# Patient Record
Sex: Female | Born: 1950 | Race: White | Hispanic: Yes | Smoking: Never smoker
Health system: Southern US, Community
[De-identification: ages and names within clinical notes are randomized; demographics above are authoritative.]

## PROBLEM LIST (undated history)

## (undated) DIAGNOSIS — I10 Essential (primary) hypertension: Secondary | ICD-10-CM

## (undated) DIAGNOSIS — I998 Other disorder of circulatory system: Secondary | ICD-10-CM

## (undated) DIAGNOSIS — N186 End stage renal disease: Secondary | ICD-10-CM

## (undated) DIAGNOSIS — R0989 Other specified symptoms and signs involving the circulatory and respiratory systems: Secondary | ICD-10-CM

## (undated) DIAGNOSIS — R7881 Bacteremia: Secondary | ICD-10-CM

## (undated) DIAGNOSIS — I639 Cerebral infarction, unspecified: Secondary | ICD-10-CM

## (undated) DIAGNOSIS — E785 Hyperlipidemia, unspecified: Secondary | ICD-10-CM

## (undated) DIAGNOSIS — E119 Type 2 diabetes mellitus without complications: Secondary | ICD-10-CM

## (undated) DIAGNOSIS — R079 Chest pain, unspecified: Secondary | ICD-10-CM

## (undated) DIAGNOSIS — N10 Acute tubulo-interstitial nephritis: Secondary | ICD-10-CM

## (undated) DIAGNOSIS — I70229 Atherosclerosis of native arteries of extremities with rest pain, unspecified extremity: Secondary | ICD-10-CM

## (undated) DIAGNOSIS — I739 Peripheral vascular disease, unspecified: Secondary | ICD-10-CM

## (undated) HISTORY — DX: Bacteremia: R78.81

## (undated) HISTORY — PX: ABDOMINAL HYSTERECTOMY: SHX81

## (undated) HISTORY — DX: Other specified symptoms and signs involving the circulatory and respiratory systems: R09.89

## (undated) HISTORY — DX: Acute pyelonephritis: N10

## (undated) HISTORY — DX: Atherosclerosis of native arteries of extremities with rest pain, unspecified extremity: I70.229

## (undated) HISTORY — DX: Chest pain, unspecified: R07.9

## (undated) HISTORY — DX: Hyperlipidemia, unspecified: E78.5

## (undated) HISTORY — DX: Other disorder of circulatory system: I99.8

---

## 2015-01-10 ENCOUNTER — Inpatient Hospital Stay (HOSPITAL_COMMUNITY)
Admission: EM | Admit: 2015-01-10 | Discharge: 2015-01-14 | DRG: 871 | Disposition: A | Discharge status: Home / Self Care | Payer: Medicaid Other | Attending: Internal Medicine | Admitting: Internal Medicine

## 2015-01-10 ENCOUNTER — Encounter (HOSPITAL_COMMUNITY): Payer: Self-pay | Admitting: *Deleted

## 2015-01-10 ENCOUNTER — Emergency Department (HOSPITAL_COMMUNITY): Payer: Medicaid Other

## 2015-01-10 DIAGNOSIS — L089 Local infection of the skin and subcutaneous tissue, unspecified: Secondary | ICD-10-CM

## 2015-01-10 DIAGNOSIS — R739 Hyperglycemia, unspecified: Secondary | ICD-10-CM

## 2015-01-10 DIAGNOSIS — B9620 Unspecified Escherichia coli [E. coli] as the cause of diseases classified elsewhere: Secondary | ICD-10-CM | POA: Diagnosis present

## 2015-01-10 DIAGNOSIS — R7881 Bacteremia: Secondary | ICD-10-CM | POA: Diagnosis present

## 2015-01-10 DIAGNOSIS — I248 Other forms of acute ischemic heart disease: Secondary | ICD-10-CM | POA: Diagnosis present

## 2015-01-10 DIAGNOSIS — Z6822 Body mass index (BMI) 22.0-22.9, adult: Secondary | ICD-10-CM | POA: Diagnosis not present

## 2015-01-10 DIAGNOSIS — K5900 Constipation, unspecified: Secondary | ICD-10-CM

## 2015-01-10 DIAGNOSIS — E11621 Type 2 diabetes mellitus with foot ulcer: Secondary | ICD-10-CM | POA: Diagnosis present

## 2015-01-10 DIAGNOSIS — R7989 Other specified abnormal findings of blood chemistry: Secondary | ICD-10-CM | POA: Diagnosis present

## 2015-01-10 DIAGNOSIS — K5909 Other constipation: Secondary | ICD-10-CM

## 2015-01-10 DIAGNOSIS — A419 Sepsis, unspecified organism: Secondary | ICD-10-CM

## 2015-01-10 DIAGNOSIS — M25512 Pain in left shoulder: Secondary | ICD-10-CM | POA: Diagnosis present

## 2015-01-10 DIAGNOSIS — IMO0002 Reserved for concepts with insufficient information to code with codable children: Secondary | ICD-10-CM | POA: Diagnosis present

## 2015-01-10 DIAGNOSIS — Z90710 Acquired absence of both cervix and uterus: Secondary | ICD-10-CM | POA: Diagnosis not present

## 2015-01-10 DIAGNOSIS — N12 Tubulo-interstitial nephritis, not specified as acute or chronic: Secondary | ICD-10-CM | POA: Diagnosis present

## 2015-01-10 DIAGNOSIS — I10 Essential (primary) hypertension: Secondary | ICD-10-CM | POA: Diagnosis present

## 2015-01-10 DIAGNOSIS — A4151 Sepsis due to Escherichia coli [E. coli]: Principal | ICD-10-CM | POA: Diagnosis present

## 2015-01-10 DIAGNOSIS — E43 Unspecified severe protein-calorie malnutrition: Secondary | ICD-10-CM | POA: Insufficient documentation

## 2015-01-10 DIAGNOSIS — E1165 Type 2 diabetes mellitus with hyperglycemia: Secondary | ICD-10-CM | POA: Diagnosis present

## 2015-01-10 DIAGNOSIS — L97509 Non-pressure chronic ulcer of other part of unspecified foot with unspecified severity: Secondary | ICD-10-CM

## 2015-01-10 DIAGNOSIS — R6510 Systemic inflammatory response syndrome (SIRS) of non-infectious origin without acute organ dysfunction: Secondary | ICD-10-CM

## 2015-01-10 DIAGNOSIS — N10 Acute tubulo-interstitial nephritis: Secondary | ICD-10-CM

## 2015-01-10 DIAGNOSIS — L97529 Non-pressure chronic ulcer of other part of left foot with unspecified severity: Secondary | ICD-10-CM | POA: Diagnosis present

## 2015-01-10 DIAGNOSIS — R509 Fever, unspecified: Secondary | ICD-10-CM | POA: Diagnosis not present

## 2015-01-10 DIAGNOSIS — R1110 Vomiting, unspecified: Secondary | ICD-10-CM

## 2015-01-10 DIAGNOSIS — N390 Urinary tract infection, site not specified: Secondary | ICD-10-CM | POA: Insufficient documentation

## 2015-01-10 HISTORY — DX: Essential (primary) hypertension: I10

## 2015-01-10 LAB — URINE MICROSCOPIC-ADD ON

## 2015-01-10 LAB — CREATININE, SERUM
CREATININE: 1.09 mg/dL (ref 0.50–1.10)
GFR calc Af Amer: 62 mL/min — ABNORMAL LOW (ref >90)
GFR, EST NON AFRICAN AMERICAN: 54 mL/min — AB (ref >90)

## 2015-01-10 LAB — COMPREHENSIVE METABOLIC PANEL
ALBUMIN: 3.6 g/dL (ref 3.5–5.2)
ALT: 20 U/L (ref 0–35)
AST: 22 U/L (ref 0–37)
Alkaline Phosphatase: 98 U/L (ref 39–117)
Anion gap: 12 (ref 5–15)
BUN: 13 mg/dL (ref 6–23)
CALCIUM: 9 mg/dL (ref 8.4–10.5)
CO2: 19 mmol/L (ref 19–32)
Chloride: 101 mEq/L (ref 96–112)
Creatinine, Ser: 1.13 mg/dL — ABNORMAL HIGH (ref 0.50–1.10)
GFR calc Af Amer: 60 mL/min — ABNORMAL LOW (ref >90)
GFR calc non Af Amer: 51 mL/min — ABNORMAL LOW (ref >90)
Glucose, Bld: 440 mg/dL — ABNORMAL HIGH (ref 70–99)
POTASSIUM: 4.1 mmol/L (ref 3.5–5.1)
SODIUM: 132 mmol/L — AB (ref 135–145)
Total Bilirubin: 0.8 mg/dL (ref 0.3–1.2)
Total Protein: 7.6 g/dL (ref 6.0–8.3)

## 2015-01-10 LAB — CBC WITH DIFFERENTIAL/PLATELET
BASOS PCT: 0 % (ref 0–1)
Basophils Absolute: 0 10*3/uL (ref 0.0–0.1)
EOS ABS: 0 10*3/uL (ref 0.0–0.7)
EOS PCT: 0 % (ref 0–5)
HEMATOCRIT: 35.7 % — AB (ref 36.0–46.0)
Hemoglobin: 12.9 g/dL (ref 12.0–15.0)
Lymphocytes Relative: 5 % — ABNORMAL LOW (ref 12–46)
Lymphs Abs: 0.8 10*3/uL (ref 0.7–4.0)
MCH: 32 pg (ref 26.0–34.0)
MCHC: 36.1 g/dL — ABNORMAL HIGH (ref 30.0–36.0)
MCV: 88.6 fL (ref 78.0–100.0)
Monocytes Absolute: 1.1 10*3/uL — ABNORMAL HIGH (ref 0.1–1.0)
Monocytes Relative: 7 % (ref 3–12)
NEUTROS PCT: 88 % — AB (ref 43–77)
Neutro Abs: 14.4 10*3/uL — ABNORMAL HIGH (ref 1.7–7.7)
Platelets: 353 10*3/uL (ref 150–400)
RBC: 4.03 MIL/uL (ref 3.87–5.11)
RDW: 12.8 % (ref 11.5–15.5)
WBC: 16.4 10*3/uL — AB (ref 4.0–10.5)

## 2015-01-10 LAB — URINALYSIS, ROUTINE W REFLEX MICROSCOPIC
BILIRUBIN URINE: NEGATIVE
Glucose, UA: >1000 mg/dL — AB
Ketones, ur: 15 mg/dL — AB
Nitrite: NEGATIVE
PH: 5 (ref 5.0–8.0)
PROTEIN: 100 mg/dL — AB
Specific Gravity, Urine: 1.023 (ref 1.005–1.030)
Urobilinogen, UA: 0.2 mg/dL (ref 0.0–1.0)

## 2015-01-10 LAB — TROPONIN I: TROPONIN I: 0.06 ng/mL — AB (ref <0.031)

## 2015-01-10 LAB — GLUCOSE, CAPILLARY
Glucose-Capillary: 332 mg/dL — ABNORMAL HIGH (ref 70–99)
Glucose-Capillary: 159 mg/dL — ABNORMAL HIGH (ref 70–99)

## 2015-01-10 LAB — CBG MONITORING, ED
GLUCOSE-CAPILLARY: 372 mg/dL — AB (ref 70–99)
Glucose-Capillary: 389 mg/dL — ABNORMAL HIGH (ref 70–99)

## 2015-01-10 LAB — I-STAT CG4 LACTIC ACID, ED
LACTIC ACID, VENOUS: 2.22 mmol/L — AB (ref 0.5–2.2)
Lactic Acid, Venous: 1.9 mmol/L (ref 0.5–2.2)

## 2015-01-10 MED ORDER — ALUM & MAG HYDROXIDE-SIMETH 200-200-20 MG/5ML PO SUSP
30.0000 mL | Freq: Four times a day (QID) | ORAL | Status: DC | PRN
  Filled 2015-01-10: qty 30

## 2015-01-10 MED ORDER — ACETAMINOPHEN 650 MG RE SUPP: 650.0000 mg | Freq: Four times a day (QID) | RECTAL | Status: DC | PRN

## 2015-01-10 MED ORDER — PNEUMOCOCCAL VAC POLYVALENT 25 MCG/0.5ML IJ INJ
0.5000 mL | INJECTION | INTRAMUSCULAR | Status: AC
  Administered 2015-01-13: 0.5 mL via INTRAMUSCULAR
  Filled 2015-01-10: qty 0.5

## 2015-01-10 MED ORDER — INSULIN ASPART 100 UNIT/ML ~~LOC~~ SOLN: 0.0000 [IU] | Freq: Three times a day (TID) | SUBCUTANEOUS | Status: DC

## 2015-01-10 MED ORDER — CEFTRIAXONE SODIUM IN DEXTROSE 20 MG/ML IV SOLN
1.0000 g | INTRAVENOUS | Status: DC
  Administered 2015-01-11 – 2015-01-13 (×3): 1 g via INTRAVENOUS
  Filled 2015-01-10 (×3): qty 50

## 2015-01-10 MED ORDER — DM-GUAIFENESIN ER 30-600 MG PO TB12
1.0000 | ORAL_TABLET | Freq: Two times a day (BID) | ORAL | Status: DC
  Administered 2015-01-10 – 2015-01-14 (×8): 1 via ORAL
  Filled 2015-01-10 (×9): qty 1

## 2015-01-10 MED ORDER — MORPHINE SULFATE 2 MG/ML IJ SOLN: 2.0000 mg | INTRAMUSCULAR | Status: DC | PRN

## 2015-01-10 MED ORDER — INSULIN REGULAR BOLUS VIA INFUSION: 0.0000 [IU] | Freq: Three times a day (TID) | INTRAVENOUS | Status: DC

## 2015-01-10 MED ORDER — ONDANSETRON HCL 4 MG PO TABS: 4.0000 mg | ORAL_TABLET | Freq: Four times a day (QID) | ORAL | Status: DC | PRN

## 2015-01-10 MED ORDER — BISACODYL 10 MG RE SUPP
10.0000 mg | Freq: Once | RECTAL | Status: AC
  Administered 2015-01-10: 10 mg via RECTAL
  Filled 2015-01-10: qty 1

## 2015-01-10 MED ORDER — DEXTROSE 50 % IV SOLN: 25.0000 mL | INTRAVENOUS | Status: DC | PRN

## 2015-01-10 MED ORDER — DEXTROSE 5 % IV SOLN
1.0000 g | Freq: Once | INTRAVENOUS | Status: AC
  Administered 2015-01-10: 1 g via INTRAVENOUS
  Filled 2015-01-10: qty 10

## 2015-01-10 MED ORDER — METOPROLOL TARTRATE 100 MG PO TABS
100.0000 mg | ORAL_TABLET | Freq: Two times a day (BID) | ORAL | Status: DC
  Administered 2015-01-10 – 2015-01-14 (×8): 100 mg via ORAL
  Filled 2015-01-10 (×9): qty 1

## 2015-01-10 MED ORDER — INSULIN REGULAR HUMAN 100 UNIT/ML IJ SOLN: INTRAMUSCULAR | Status: DC

## 2015-01-10 MED ORDER — SODIUM CHLORIDE 0.9 % IV SOLN: INTRAVENOUS | Status: DC

## 2015-01-10 MED ORDER — ACETAMINOPHEN 325 MG PO TABS
650.0000 mg | ORAL_TABLET | Freq: Four times a day (QID) | ORAL | Status: DC | PRN
  Administered 2015-01-10 – 2015-01-12 (×3): 650 mg via ORAL
  Filled 2015-01-10 (×4): qty 2

## 2015-01-10 MED ORDER — CEFTRIAXONE SODIUM IN DEXTROSE 20 MG/ML IV SOLN: 1.0000 g | INTRAVENOUS | Status: DC

## 2015-01-10 MED ORDER — HYDRALAZINE HCL 20 MG/ML IJ SOLN: 10.0000 mg | Freq: Four times a day (QID) | INTRAMUSCULAR | Status: DC | PRN

## 2015-01-10 MED ORDER — ASPIRIN EC 81 MG PO TBEC
81.0000 mg | DELAYED_RELEASE_TABLET | Freq: Every day | ORAL | Status: DC
  Administered 2015-01-10 – 2015-01-14 (×5): 81 mg via ORAL
  Filled 2015-01-10 (×5): qty 1

## 2015-01-10 MED ORDER — SODIUM CHLORIDE 0.9 % IV BOLUS (SEPSIS)
1000.0000 mL | Freq: Once | INTRAVENOUS | Status: AC
Start: 2015-01-10 — End: 2015-01-10
  Administered 2015-01-10: 1000 mL via INTRAVENOUS

## 2015-01-10 MED ORDER — ACETAMINOPHEN 500 MG PO TABS
1000.0000 mg | ORAL_TABLET | Freq: Once | ORAL | Status: AC
  Administered 2015-01-10: 1000 mg via ORAL
  Filled 2015-01-10: qty 2

## 2015-01-10 MED ORDER — INSULIN ASPART 100 UNIT/ML ~~LOC~~ SOLN
0.0000 [IU] | SUBCUTANEOUS | Status: DC
  Administered 2015-01-10: 3 [IU] via SUBCUTANEOUS
  Administered 2015-01-10: 11 [IU] via SUBCUTANEOUS
  Administered 2015-01-11 (×3): 3 [IU] via SUBCUTANEOUS

## 2015-01-10 MED ORDER — SODIUM CHLORIDE 0.9 % IV SOLN
INTRAVENOUS | Status: AC
  Administered 2015-01-10: 1000 mL via INTRAVENOUS

## 2015-01-10 MED ORDER — INSULIN GLARGINE 100 UNIT/ML ~~LOC~~ SOLN
10.0000 [IU] | Freq: Every day | SUBCUTANEOUS | Status: DC
  Administered 2015-01-10 – 2015-01-12 (×3): 10 [IU] via SUBCUTANEOUS
  Filled 2015-01-10 (×4): qty 0.1

## 2015-01-10 MED ORDER — DEXTROSE-NACL 5-0.45 % IV SOLN: INTRAVENOUS | Status: DC

## 2015-01-10 MED ORDER — INSULIN ASPART 100 UNIT/ML ~~LOC~~ SOLN
3.0000 [IU] | Freq: Three times a day (TID) | SUBCUTANEOUS | Status: DC
  Administered 2015-01-11 – 2015-01-13 (×4): 3 [IU] via SUBCUTANEOUS

## 2015-01-10 MED ORDER — DOCUSATE SODIUM 100 MG PO CAPS
100.0000 mg | ORAL_CAPSULE | Freq: Two times a day (BID) | ORAL | Status: DC
  Administered 2015-01-10 – 2015-01-14 (×8): 100 mg via ORAL
  Filled 2015-01-10 (×9): qty 1

## 2015-01-10 MED ORDER — HEPARIN SODIUM (PORCINE) 5000 UNIT/ML IJ SOLN
5000.0000 [IU] | Freq: Three times a day (TID) | INTRAMUSCULAR | Status: DC
  Administered 2015-01-10 – 2015-01-14 (×11): 5000 [IU] via SUBCUTANEOUS
  Filled 2015-01-10 (×14): qty 1

## 2015-01-10 MED ORDER — ONDANSETRON HCL 4 MG/2ML IJ SOLN: 4.0000 mg | Freq: Four times a day (QID) | INTRAMUSCULAR | Status: DC | PRN

## 2015-01-10 MED ORDER — INSULIN ASPART 100 UNIT/ML ~~LOC~~ SOLN
0.0000 [IU] | SUBCUTANEOUS | Status: DC
  Administered 2015-01-10: 9 [IU] via SUBCUTANEOUS
  Filled 2015-01-10: qty 1

## 2015-01-10 MED ORDER — SODIUM CHLORIDE 0.9 % IJ SOLN
3.0000 mL | Freq: Two times a day (BID) | INTRAMUSCULAR | Status: DC
  Administered 2015-01-10 – 2015-01-13 (×6): 3 mL via INTRAVENOUS

## 2015-01-10 NOTE — H&P (Signed)
[facility] History and Physical                                                                                    Patient Demographics  Samantha [name], is a 64 y.o. female  MRN: [id number]   DOB - 12/10/1951  Admit Date - 01/10/2015  Outpatient Primary MD for the patient is CLOWARD,DAVIS L, MD   With History of -  Past Medical History  Diagnosis Date  . Hypertension   . Diabetes mellitus without complication       Past Surgical History  Procedure Laterality Date  . Abdominal hysterectomy      in for   Chief Complaint  Patient presents with  . Hypertension  . Headache  . Fatigue     HPI  Samantha [name]  is a 64 y.o. spanish-speaking female, with a past medical history of diabetes mellitus, urinary tract infection, and hypertension.  Her daughter is at bedside and ask as a Nurse, learning disability. Samantha [name] presents with 3-4 days of feeling poorly, subjective fevers, vomiting on January 13 and 14th, frequent urination with odor, blurry vision, polydipsia. She also mentions a slight sore throat, cough, and left ear pain.   I in the emergency room and she is found to have a fever of 102.4. Leukocytosis with a white count of 16.4, and tachycardia.  Her CBG is over 400.  Review of Systems    In addition to the HPI above,   + Head Ache, Blurry vision, Poly dipsia, Poly uria No problems swallowing food or Liquids, No Chest pain or Shortness of Breath, but she does complain of left sided shoulder pain. No Abdominal pain, + Bowel mvmts are constipated. No Blood in stool or Urine, No new skin rashes or bruises, + She complains of pain in her left foot, and a swollen 4th toe. No new weakness, tingling, numbness in any extremity, No recent weight gain or loss, No significant Mental Stressors.  A full 10 point Review of Systems was done, except as stated above, all other Review of Systems were negative.   Social History History  Substance Use Topics  . Smoking status:  Never Smoker   . Smokeless tobacco: Not on file  . Alcohol Use: No   She lives with her daughter who cares for her.  Her daughter describes the patient as sedentary.    Family History   Prior to Admission medications   Medication Sig Start Date End Date Taking? Authorizing Provider  glimepiride (AMARYL) 2 MG tablet Take 2 mg by mouth daily with breakfast.   Yes Historical Provider, MD  ibuprofen (ADVIL,MOTRIN) 200 MG tablet Take 400 mg by mouth every 6 (six) hours as needed (pain/fever).   Yes Historical Provider, MD  metFORMIN (GLUCOPHAGE) 500 MG tablet Take 500 mg by mouth 2 (two) times daily with a meal.   Yes Historical Provider, MD  metoprolol (LOPRESSOR) 100 MG tablet Take 100 mg by mouth 2 (two) times daily.   Yes Historical Provider, MD    No Known Allergies  Physical Exam  Vitals  Blood pressure 150/73, pulse 91, temperature 99.6 F (37.6 C), temperature source Oral, resp. rate 16, height   (1.6 m), weight 58.06 kg (128 lb), SpO2 99 %.   General:  Samantha [name], Hispanic female, lying in bed in NAD, Dtr at bedside.  Psych:  Normal affect and insight, Not Suicidal or Homicidal, Awake Alert, Oriented X 3.  Neuro:   No F.N deficits, ALL C.Nerves Intact, Strength 5/5 all 4 extremities, Sensation intact all 4 extremities.  ENT:  Ears and Eyes appear Normal, Conjunctivae clear, PERR. Moist Oral Mucosa.  Neck:  Supple Neck, No JVD, No cervical lymphadenopathy appreciated  Respiratory:  Symmetrical Chest wall movement, Good air movement bilaterally, CTAB.  Cardiac:  RRR, No Gallops, Rubs or Murmurs, No Parasternal Heave.  Abdomen:  Positive Bowel Sounds, Abdomen Soft, Mildly tender to palpation.  Skin:  No Cyanosis, Normal Skin Turgor, No Skin Rash or Bruise.  Dry ulceration on plantar surface of 4th left toe.  Extremities:  Good muscle tone,  joints appear normal , no effusions, Normal ROM.  4th left toe is swollen/ boggy looking.   Data Review  CBC  Recent  Labs Lab 01/10/15 1104  WBC 16.4*  HGB 12.9  HCT 35.7*  PLT 353  MCV 88.6  MCH 32.0  MCHC 36.1*  RDW 12.8  LYMPHSABS 0.8  MONOABS 1.1*  EOSABS 0.0  BASOSABS 0.0   ------------------------------------------------------------------------------------------------------------------  Chemistries   Recent Labs Lab 01/10/15 1104  NA 132*  K 4.1  CL 101  CO2 19  GLUCOSE 440*  BUN 13  CREATININE 1.13*  CALCIUM 9.0  AST 22  ALT 20  ALKPHOS 98  BILITOT 0.8    Urinalysis    Component Value Date/Time   COLORURINE YELLOW 01/10/2015 1315   APPEARANCEUR CLOUDY* 01/10/2015 1315   LABSPEC 1.023 01/10/2015 1315   PHURINE 5.0 01/10/2015 1315   GLUCOSEU >1000* 01/10/2015 1315   HGBUR MODERATE* 01/10/2015 1315   BILIRUBINUR NEGATIVE 01/10/2015 1315   KETONESUR 15* 01/10/2015 1315   PROTEINUR 100* 01/10/2015 1315   UROBILINOGEN 0.2 01/10/2015 1315   NITRITE NEGATIVE 01/10/2015 1315   LEUKOCYTESUR MODERATE* 01/10/2015 1315    ----------------------------------------------------------------------------------------------------------------  Imaging results:   Dg Chest Port 1 View  01/10/2015   CLINICAL DATA:  Fever.  Sepsis.  EXAM: PORTABLE CHEST - 1 VIEW  COMPARISON:  None.  FINDINGS: Heart size and pulmonary vascularity are normal. Lungs are clear. No osseous abnormality. The patient has taken a shallow inspiration.  IMPRESSION: Normal exam.   Electronically Signed   By: Geanie Cooley M.D.   On: 01/10/2015 11:17   Dg Foot Complete Left  01/10/2015   CLINICAL DATA:  Sepsis, fever, infected fourth toe  EXAM: LEFT FOOT - COMPLETE 3+ VIEW  COMPARISON:  None.  FINDINGS: Three views of the left foot submitted. No acute fracture or subluxation. Small plantar spur of calcaneus. Mild soft tissue swelling fourth toe. No definite bone destruction to suggest osteomyelitis.  IMPRESSION: No acute fracture or subluxation. Mild soft tissue swelling fourth toe. No definite bone destruction to  suggest osteomyelitis. Small plantar spur of calcaneus.   Electronically Signed   By: Natasha Mead M.D.   On: 01/10/2015 11:32    My personal review of EKG: pending.    Assessment & Plan  Principal Problem:   SIRS (systemic inflammatory response syndrome) Active Problems:   Pyelonephritis   Diabetic toe ulcer   Constipation   Uncontrolled diabetes mellitus   SIRS Likely due to UTI / Pyelonephritis.  Blood cultures and urine cultures are pending.  Pyelonephritis Patient with fever, vomiting, dysuria, fever.  Urine culture is pending.  Patient is started on Rocephin.  Uncontrolled Diabetes Mellitus with CBG 400+ and glucosuria Patient placed on glucostabilizer. She does not have DKA.  Checking Hgb A1C.  Suspect she will need insulin.  She has been compliant with amaryl and metformin.  Diabetic Coordinator has been consulted.  Constipation Patient reports no bowel movement x 5 days.  Will start colace and ducolax suppository.  URI Patient with cough, mild sore throat, left ear pain.  Treat supportively.  Left shoulder pain Non reproducible on palpation.  Will check Troponin x 1 and EKG.    DVT Prophylaxis Heparin  AM Labs Ordered, also please review Full Orders  Family Communication:   dtr at bedside.   Code Status: full  Likely DC to  home  Condition:  guarded  Time spent in minutes : 60    York, Marianne L PA-C on 01/10/2015 at 3:52 PM  Between 7am to 7pm - Pager - [phone]  After 7pm go to [email or web address] - password [email or web address]  And look for the night coverage person covering me after hours  [facility] Office  [phone]  Attending Patient was seen, examined,treatment plan was discussed with the  Advance Practice Provider.  I have directly reviewed the clinical findings, lab, imaging studies and management of this patient in detail. I have made the necessary changes to the above noted documentation, and agree with the documentation, as recorded by  the Advance Practice Provider.   64 year old Hispanic female with DM, HTN-admitted with Pyelonephritis and uncontrolled DM-symptomatic with polyuria/polydipsia. Agree with IV Rocephin and IV Glucose Stabilizer. Rest as above.  Windell NorfolkS Ghimire MD [facility].

## 2015-01-10 NOTE — Progress Notes (Signed)
Samantha CrandallMaria [name] [id number] Admitted to 5W20: 01/10/2015 5:23 PM Attending Provider: Maretta BeesShanker M Ghimire, MD    Samantha CrandallMaria [name] is a 64 y.o. female patient admitted from ED awake, alert  & orientated  X 3,  Full Code, VSS - Blood pressure 162/75, pulse 117, temperature 100.3 F (37.9 [name]), temperature source Oral, resp. rate 24, height 5\' 3"  (1.6 m), weight 58.06 kg (128 lb), SpO2 100 %., R/A, no [name]/o shortness of breath, no [name]/o chest pain, no distress noted. Tele # 19 placed and pt is currently running:sinus tachycardia.   IV site WDL:  forearm left, condition patent and no redness with a transparent dsg that's clean dry and intact.  Allergies:  No Known Allergies   Past Medical History  Diagnosis Date  . Hypertension   . Diabetes mellitus without complication     History:  obtained from pt. with daugter.  Pt orientation to unit, room and routine. Information packet given to patient/family and safety video watched.  Admission INP armband ID verified with patient/family, and in place. SR up x 2, fall risk assessment complete with Patient and family verbalizing understanding of risks associated with falls. Pt verbalizes an understanding of how to use the call bell and to call for help before getting out of bed.  Skin, clean-dry- intact without evidence of bruising, or skin tears.  DM foot ulcer to 4th toe on left foot.    Will cont to monitor and assist as needed.  Samantha [name], Samantha Stephens C, RN 01/10/2015 5:23 PM2

## 2015-01-10 NOTE — ED Notes (Signed)
Diabetic ulcer 4th toe left foot. Open to air. No drainage or pain.

## 2015-01-10 NOTE — ED Notes (Signed)
Daughter reports pt has had problems with constipation and reports pt's last bowel movement x 5 days ago.  Pt denies any abdominal pain or nausea at present.  Dr. Rubin PayorPickering aware.

## 2015-01-10 NOTE — ED Notes (Signed)
Patient with reported onset of not feeling well since yesterday.  She is complaining of headache and feeling tired.  She denies chest pain.  Denies dizziness.  Patient daughter is translating for patient per request.  Patient is febrile in triage.   Patient states she has been urinating more and has noticed a bad odor to her urine.  Patient has yellow coloring of skin noted as well.  Patient denies sob

## 2015-01-10 NOTE — ED Provider Notes (Signed)
CSN: [id number]     Arrival date & time 01/10/15  1012 History   First MD Initiated Contact with Patient 01/10/15 1050     Chief Complaint  Patient presents with  . Hypertension  . Headache  . Fatigue   Translated by family member  (Consider location/radiation/quality/duration/timing/severity/associated sxs/prior Treatment) Patient is a 64 y.o. female presenting with hypertension and headaches. The history is provided by the patient.  Hypertension Associated symptoms include headaches. Pertinent negatives include no chest pain, no abdominal pain and no shortness of breath.  Headache Associated symptoms: fatigue and fever   Associated symptoms: no abdominal pain, no back pain, no diarrhea, no pain, no nausea, no neck stiffness, no numbness and no vomiting    patient presents with fever and feeling weak. Has felt bad for last few days. Has possibly had some dysuria. No cough. No nausea vomiting. Feels fatigued. Headache. Blood pressure is also been elevated. States she has been out of her strips for her glucose monitor.  Past Medical History  Diagnosis Date  . Hypertension   . Diabetes mellitus without complication    Past Surgical History  Procedure Laterality Date  . Abdominal hysterectomy     No family history on file. History  Substance Use Topics  . Smoking status: Never Smoker   . Smokeless tobacco: Not on file  . Alcohol Use: No   OB History    No data available     Review of Systems  Constitutional: Positive for fever, appetite change and fatigue. Negative for activity change.  Eyes: Negative for pain.  Respiratory: Negative for chest tightness and shortness of breath.   Cardiovascular: Negative for chest pain and leg swelling.  Gastrointestinal: Negative for nausea, vomiting, abdominal pain and diarrhea.  Genitourinary: Positive for dysuria. Negative for flank pain.  Musculoskeletal: Negative for back pain and neck stiffness.  Skin: Negative for rash.   Neurological: Positive for headaches. Negative for weakness and numbness.  Psychiatric/Behavioral: Negative for behavioral problems.      Allergies  Review of patient's allergies indicates no known allergies.  Home Medications   Prior to Admission medications   Medication Sig Start Date End Date Taking? Authorizing Provider  glimepiride (AMARYL) 2 MG tablet Take 2 mg by mouth daily with breakfast.   Yes Historical Provider, MD  ibuprofen (ADVIL,MOTRIN) 200 MG tablet Take 400 mg by mouth every 6 (six) hours as needed (pain/fever).   Yes Historical Provider, MD  metFORMIN (GLUCOPHAGE) 500 MG tablet Take 500 mg by mouth 2 (two) times daily with a meal.   Yes Historical Provider, MD  metoprolol (LOPRESSOR) 100 MG tablet Take 100 mg by mouth 2 (two) times daily.   Yes Historical Provider, MD   BP 153/73 mmHg  Pulse 94  Temp(Src) 99.6 F (37.6 C) (Oral)  Resp 16  Ht  (1.6 m)  Wt 128 lb (58.06 kg)  BMI 22.68 kg/m2  SpO2 96% Physical Exam  Constitutional: She is oriented to person, place, and time. She appears well-developed and well-nourished.  HENT:  Head: Normocephalic and atraumatic.  Eyes: EOM are normal. Pupils are equal, round, and reactive to light.  Neck: Normal range of motion. Neck supple.  Cardiovascular: Regular rhythm and normal heart sounds.   No murmur heard. Tachycardia  Pulmonary/Chest: Effort normal and breath sounds normal. No respiratory distress. She has no wheezes. She has no rales.  Abdominal: Soft. Bowel sounds are normal. She exhibits no distension. There is no tenderness. There is no rebound and no  guarding.  Genitourinary:  No CVA tenderness.  Musculoskeletal: Normal range of motion.  Left fourth toe has an ulcer on the tip of it. No erythema or drainage.  Neurological: She is alert and oriented to person, place, and time. No cranial nerve deficit.  Skin: Skin is warm and dry.  Psychiatric: She has a normal mood and affect. Her speech is normal.   Nursing note and vitals reviewed.   ED Course  Procedures (including critical care time) Labs Review Labs Reviewed  CBC WITH DIFFERENTIAL - Abnormal; Notable for the following:    WBC 16.4 (*)    HCT 35.7 (*)    MCHC 36.1 (*)    Neutrophils Relative % 88 (*)    Neutro Abs 14.4 (*)    Lymphocytes Relative 5 (*)    Monocytes Absolute 1.1 (*)    All other components within normal limits  COMPREHENSIVE METABOLIC PANEL - Abnormal; Notable for the following:    Sodium 132 (*)    Glucose, Bld 440 (*)    Creatinine, Ser 1.13 (*)    GFR calc non Af Amer 51 (*)    GFR calc Af Amer 60 (*)    All other components within normal limits  URINALYSIS, ROUTINE W REFLEX MICROSCOPIC - Abnormal; Notable for the following:    APPearance CLOUDY (*)    Glucose, UA >1000 (*)    Hgb urine dipstick MODERATE (*)    Ketones, ur 15 (*)    Protein, ur 100 (*)    Leukocytes, UA MODERATE (*)    All other components within normal limits  URINE MICROSCOPIC-ADD ON - Abnormal; Notable for the following:    Squamous Epithelial / LPF MANY (*)    Bacteria, UA MANY (*)    All other components within normal limits  CBG MONITORING, ED - Abnormal; Notable for the following:    Glucose-Capillary 389 (*)    All other components within normal limits  I-STAT CG4 LACTIC ACID, ED - Abnormal; Notable for the following:    Lactic Acid, Venous 2.22 (*)    All other components within normal limits  CULTURE, BLOOD (ROUTINE X 2)  CULTURE, BLOOD (ROUTINE X 2)  URINE CULTURE  I-STAT CG4 LACTIC ACID, ED    Imaging Review Dg Chest Port 1 View  01/10/2015   CLINICAL DATA:  Fever.  Sepsis.  EXAM: PORTABLE CHEST - 1 VIEW  COMPARISON:  None.  FINDINGS: Heart size and pulmonary vascularity are normal. Lungs are clear. No osseous abnormality. The patient has taken a shallow inspiration.  IMPRESSION: Normal exam.   Electronically Signed   By: Geanie CooleyJim  Maxwell M.D.   On: 01/10/2015 11:17   Dg Foot Complete Left  01/10/2015   CLINICAL  DATA:  Sepsis, fever, infected fourth toe  EXAM: LEFT FOOT - COMPLETE 3+ VIEW  COMPARISON:  None.  FINDINGS: Three views of the left foot submitted. No acute fracture or subluxation. Small plantar spur of calcaneus. Mild soft tissue swelling fourth toe. No definite bone destruction to suggest osteomyelitis.  IMPRESSION: No acute fracture or subluxation. Mild soft tissue swelling fourth toe. No definite bone destruction to suggest osteomyelitis. Small plantar spur of calcaneus.   Electronically Signed   By: Natasha MeadLiviu  Pop M.D.   On: 01/10/2015 11:32     EKG Interpretation None      MDM   Final diagnoses:  Toe infection  Acute pyelonephritis  Sepsis, due to unspecified organism  Hyperglycemia    Patient with fever. Appears free from  UTI. Initially febrile and tachycardiac that is improved with IV fluids and Tylenol. IV antibiotics started. Toe does not appear to be severely infected this time. Will admit to internal medicine.    Juliet Rude. Rubin Payor, MD 01/10/15 1451

## 2015-01-10 NOTE — Progress Notes (Signed)
Report received from Serita KyleAshura, RN in ED.  Will await for pt. To arrive to 5W 20.  [place] Cellarelcine Zorion Nims, RN

## 2015-01-10 NOTE — Progress Notes (Signed)
Paged night coverage to inform of pt. CBG of 159 after second dose of insulin 11 units was given.  Also to inform of temp. of 102.5, tylenol 650 mg po given.  Will continue to monitor.

## 2015-01-10 NOTE — ED Notes (Signed)
Pt transported to and from bathroom to void with daughter's assistance.  Pt tolerated well.

## 2015-01-10 NOTE — Progress Notes (Signed)
Paged Algis DownsMarianne York, PAC to get clarification on admission orders, if we needed to start the gluco stabilizer.  Return call back form Clerance LavMarianne and new orders received and carried out.  Will continue to monitor.  [facility] Cellarelcine Jaeda Bruso, RN

## 2015-01-11 ENCOUNTER — Inpatient Hospital Stay (HOSPITAL_COMMUNITY): Payer: Medicaid Other

## 2015-01-11 DIAGNOSIS — E43 Unspecified severe protein-calorie malnutrition: Secondary | ICD-10-CM | POA: Insufficient documentation

## 2015-01-11 LAB — COMPREHENSIVE METABOLIC PANEL
ALT: 19 U/L (ref 0–35)
ANION GAP: 11 (ref 5–15)
AST: 30 U/L (ref 0–37)
Albumin: 3.3 g/dL — ABNORMAL LOW (ref 3.5–5.2)
Alkaline Phosphatase: 75 U/L (ref 39–117)
BUN: 12 mg/dL (ref 6–23)
CO2: 23 mmol/L (ref 19–32)
Calcium: 8.7 mg/dL (ref 8.4–10.5)
Chloride: 104 mEq/L (ref 96–112)
Creatinine, Ser: 0.97 mg/dL (ref 0.50–1.10)
GFR calc non Af Amer: 62 mL/min — ABNORMAL LOW (ref >90)
GFR, EST AFRICAN AMERICAN: 72 mL/min — AB (ref >90)
Glucose, Bld: 131 mg/dL — ABNORMAL HIGH (ref 70–99)
Potassium: 3.8 mmol/L (ref 3.5–5.1)
SODIUM: 138 mmol/L (ref 135–145)
Total Bilirubin: 0.4 mg/dL (ref 0.3–1.2)
Total Protein: 7.3 g/dL (ref 6.0–8.3)

## 2015-01-11 LAB — GLUCOSE, CAPILLARY
GLUCOSE-CAPILLARY: 197 mg/dL — AB (ref 70–99)
GLUCOSE-CAPILLARY: 157 mg/dL — AB (ref 70–99)
GLUCOSE-CAPILLARY: 151 mg/dL — AB (ref 70–99)
GLUCOSE-CAPILLARY: 136 mg/dL — AB (ref 70–99)
Glucose-Capillary: 117 mg/dL — ABNORMAL HIGH (ref 70–99)
Glucose-Capillary: 118 mg/dL — ABNORMAL HIGH (ref 70–99)
Glucose-Capillary: 90 mg/dL (ref 70–99)

## 2015-01-11 LAB — CBC
HCT: 35.4 % — ABNORMAL LOW (ref 36.0–46.0)
Hemoglobin: 12.4 g/dL (ref 12.0–15.0)
MCH: 31.4 pg (ref 26.0–34.0)
MCHC: 35 g/dL (ref 30.0–36.0)
MCV: 89.6 fL (ref 78.0–100.0)
Platelets: 340 10*3/uL (ref 150–400)
RBC: 3.95 MIL/uL (ref 3.87–5.11)
RDW: 13 % (ref 11.5–15.5)
WBC: 11.8 10*3/uL — ABNORMAL HIGH (ref 4.0–10.5)

## 2015-01-11 LAB — TROPONIN I
Troponin I: 0.05 ng/mL — ABNORMAL HIGH (ref <0.031)
Troponin I: 0.04 ng/mL — ABNORMAL HIGH (ref <0.031)
Troponin I: 0.04 ng/mL — ABNORMAL HIGH (ref <0.031)

## 2015-01-11 LAB — HEMOGLOBIN A1C
Hgb A1c MFr Bld: 10.1 % — ABNORMAL HIGH (ref <5.7)
MEAN PLASMA GLUCOSE: 243 mg/dL — AB (ref <117)

## 2015-01-11 MED ORDER — BOOST / RESOURCE BREEZE PO LIQD
1.0000 | Freq: Three times a day (TID) | ORAL | Status: DC
  Administered 2015-01-11 – 2015-01-12 (×2): 1 via ORAL

## 2015-01-11 MED ORDER — FLEET ENEMA 7-19 GM/118ML RE ENEM
1.0000 | ENEMA | RECTAL | Status: DC | PRN
  Filled 2015-01-11 (×2): qty 1

## 2015-01-11 MED ORDER — LIVING WELL WITH DIABETES BOOK - IN SPANISH
Freq: Once | Status: AC
  Administered 2015-01-11: 10:00:00
  Filled 2015-01-11: qty 1

## 2015-01-11 MED ORDER — INSULIN ASPART 100 UNIT/ML ~~LOC~~ SOLN
0.0000 [IU] | Freq: Three times a day (TID) | SUBCUTANEOUS | Status: DC
  Administered 2015-01-12: 2 [IU] via SUBCUTANEOUS
  Administered 2015-01-12: 11 [IU] via SUBCUTANEOUS
  Administered 2015-01-12: 3 [IU] via SUBCUTANEOUS
  Administered 2015-01-13 (×2): 5 [IU] via SUBCUTANEOUS
  Administered 2015-01-13: 8 [IU] via SUBCUTANEOUS

## 2015-01-11 MED ORDER — GLUCERNA SHAKE PO LIQD: 237.0000 mL | Freq: Two times a day (BID) | ORAL | Status: DC

## 2015-01-11 MED ORDER — INSULIN STARTER KIT- SYRINGES (SPANISH)
1.0000 | Freq: Once | Status: AC
  Administered 2015-01-11: 1
  Filled 2015-01-11: qty 1

## 2015-01-11 MED ORDER — INSULIN ASPART 100 UNIT/ML ~~LOC~~ SOLN
0.0000 [IU] | Freq: Every day | SUBCUTANEOUS | Status: DC
Start: 2015-01-11 — End: 2015-01-11

## 2015-01-11 NOTE — Evaluation (Signed)
Physical Therapy Evaluation Patient Details Name: Samantha [name] Hynson MRN: [id number] DOB: Nov 18, 1951 Today's Date: 01/11/2015   History of Present Illness  Samantha [name] Vullo  is a 64 y.o. spanish-speaking female, with a past medical history of diabetes mellitus, urinary tract infection, and hypertension.  Her daughter is at bedside and ask as a Nurse, learning disabilitytranslator. Mrs. Lysbeth PennerOrtega presents with 3-4 days of feeling poorly, subjective fevers, vomiting on January 13 and 14th, frequent urination with odor, blurry vision, polydipsia. She also mentions a slight sore throat, cough, and left ear pain.   I in the emergency room and she is found to have a fever of 102.4. Leukocytosis with a white count of 16.4, and tachycardia.  Her CBG is over 400.  Clinical Impression  Pt is feeling better, approaching baseline function.  Can be assisted as needed by her daughter as needed.  No further PT needs.  Will sign off.    Follow Up Recommendations No PT follow up    Equipment Recommendations  None recommended by PT    Recommendations for Other Services       Precautions / Restrictions Precautions Precautions: None      Mobility  Bed Mobility Overal bed mobility: Independent                Transfers Overall transfer level: Independent                  Ambulation/Gait Ambulation/Gait assistance: Independent   Assistive device: None Gait Pattern/deviations: WFL(Within Functional Limits)   Gait velocity interpretation: at or above normal speed for age/gender General Gait Details: Steady and fluid  Stairs            Wheelchair Mobility    Modified Rankin (Stroke Patients Only)       Balance Overall balance assessment: No apparent balance deficits (not formally assessed)                                           Pertinent Vitals/Pain Pain Assessment: No/denies pain    Home Living Family/patient expects to be discharged to:: Private residence Living  Arrangements: Children Available Help at Discharge: Family;Available 24 hours/day Type of Home: House Home Access: Level entry     Home Layout: Two level;Bed/bath upstairs        Prior Function Level of Independence: Independent               Hand Dominance        Extremity/Trunk Assessment               Lower Extremity Assessment: Overall WFL for tasks assessed      Cervical / Trunk Assessment: Normal  Communication   Communication: No difficulties  Cognition Arousal/Alertness: Awake/alert Behavior During Therapy: WFL for tasks assessed/performed Overall Cognitive Status: Within Functional Limits for tasks assessed                      General Comments      Exercises        Assessment/Plan    PT Assessment Patent does not need any further PT services  PT Diagnosis     PT Problem List    PT Treatment Interventions     PT Goals (Current goals can be found in the Care Plan section) Acute Rehab PT Goals PT Goal Formulation: All assessment and education complete, DC therapy  Frequency     Barriers to discharge        Co-evaluation               End of Session   Activity Tolerance: Patient tolerated treatment well Patient left: Other (comment) (left on toilet for pt's daughter to assist) Nurse Communication: Mobility status         Time: 4782-9562 PT Time Calculation (min) (ACUTE ONLY): 19 min   Charges:   PT Evaluation $Initial PT Evaluation Tier I: 1 Procedure PT Treatments $Gait Training: 8-22 mins   PT G Codes:        Isley Weisheit, Eliseo Gum 01/11/2015, 11:59 AM 01/11/2015  [place] Bing, PT [phone]  (pager)

## 2015-01-11 NOTE — Progress Notes (Signed)
Pt refused to self inject insulin. Stated she was shaky from being cold and did not want to do it right now. Told patient that she needs to be able to self inject insulin before being discharged home as she will be discharged with insulin for her diabetes. Pt stated she will do it later.

## 2015-01-11 NOTE — Consult Note (Signed)
WOC wound consult note Reason for Consult:  Left 4th toe Assessment:  Pt with palpable pulses, no significant edema.  Pt reports present for "about a month" via her daughter who is interpreter.   Non tender.  Wound type: neuropathic ulcer Measurement: 0.5cm x 0.5cm x 0 Wound bed: hyperkeratotic area, not open Drainage (amount, consistency, odor) no drainage Periwound: intact, some erythema noted of the affected toe, but minimal, no streaking  Dressing procedure/placement/frequency: Paint ulceration with betadine daily, allow to air dry.  No need for cover dressing.   Explained to daughter who discussed with patient to monitor the toe for any changes, drainage, new onset of pain, discoloration or erythema and to follow up with MD should any of these things occur.   Daughter verbalized understanding.  Discussed POC with patient and bedside nurse.  Re consult if needed, will not follow at this time. Thanks  Shankar Silber [employer] RN, CWOCN [phone])

## 2015-01-11 NOTE — Progress Notes (Addendum)
INITIAL NUTRITION ASSESSMENT  DOCUMENTATION CODES Per approved criteria  -Severe malnutrition in the context of chronic illness   Pt meets criteria for severe MALNUTRITION in the context of chronic illness as evidenced by moderate to severe fat and muscle depletion, <75% of estimated energy intake x 1 month.  INTERVENTION: -D/c Glucerna Shake po TID, each supplement provides 220 kcal and 10 grams of protein -Resource Breeze po TID, each supplement provides 250 kcal and 9 grams of protein  NUTRITION DIAGNOSIS: Inadequate oral intake related to decreased appetite as evidenced by diet hx.   Goal: Pt will meet >90% of estimated nutritional needs  Monitor:  PO/supplement intake, labs, weight changes, I/O's  Reason for Assessment: MST=2, Consult for diet education  64 y.o. female  Admitting Dx: SIRS (systemic inflammatory response syndrome)  ASSESSMENT: Pt admitted with SIRS. Hx obtained by pt daughter at bedside. She reports that pt has been in declining health since she moved from [place] to the [place] area approximately 2 months ago, where she is currently living with her daughter with her husband and children. Daughter reports that pt's health was poor in [place] as well. She confirms that pt has been losing weight for quite some time, but unable to quantify amount or time frame for weight loss. Daughter reports that pt's appetite is poor at baseline and often will only eat a few bites at meals. Pt ate very little breakfast this AM, per her daughter. She reports pt will occasionally have difficulty swallowing mushy foods, such as bread.  Pt reports that she feels like her clothes are baggier and that she can tell a significant difference in her legs, which have "been getting much smaller".  Pt daughter reports that pt has tried to drink Glucerna and Ensure, however, pt reports her "heart races" when she drinks them. She is agreeable to trying [employer]. Educated on importance of  good PO intake to promote healing.  Labs reviewed. Glucose: 131, CBGS: 90-197.   ADDENDUM (1518): Received consult for diet education, per family request. Visited pt and no family members currently available. Left AND Nutrition Care Manual's "Carbohydrate Counting For People With Diabetes" handout (Spanish version) and "My Plate handout" at bedside next to DM Coordinator's education materials. Will follow-up on 01/14/15 for reinforcement if pt is still in the hospital. Pt will be followed by the [facility].   Nutrition Focused Physical Exam:  Subcutaneous Fat:  Orbital Region: moderate depletion Upper Arm Region: moderate depletion Thoracic and Lumbar Region: WDL  Muscle:  Temple Region: moderate depletion Clavicle Bone Region: mild depletion Clavicle and Acromion Bone Region: mild depletion Scapular Bone Region: mild depletion Dorsal Hand: WDL Patellar Region: severe depletion Anterior Thigh Region: severe depletion Posterior Calf Region: severe depletion  Edema: none present   Height: Ht Readings from Last 1 Encounters:  01/10/15 '5\' 3"'  (1.6 m)    Weight: Wt Readings from Last 1 Encounters:  01/10/15 128 lb (58.06 kg)    Ideal Body Weight: 115#  % Ideal Body Weight: 111%  Wt Readings from Last 10 Encounters:  01/10/15 128 lb (58.06 kg)    Usual Body Weight: unknown  % Usual Body Weight: unknown  BMI:  Body mass index is 22.68 kg/(m^2). Normal weight range  Estimated Nutritional Needs: Kcal: 1700-1900 Protein: 75-85 grams Fluid: 1.7-1.9 L  Skin: DM ulcer left 4th toe  Diet Order: Diet Carb Modified  EDUCATION NEEDS: -Education needs addressed   Intake/Output Summary (Last 24 hours) at 01/11/15 1219 Last data filed  at 01/11/15 0015  Gross per 24 hour  Intake   1460 ml  Output    445 ml  Net   1015 ml    Last BM: 01/11/15  Labs:   Recent Labs Lab 01/10/15 1104 01/10/15 1911 01/11/15 0635  NA 132*  --  138  K 4.1  --  3.8  CL  101  --  104  CO2 19  --  23  BUN 13  --  12  CREATININE 1.13* 1.09 0.97  CALCIUM 9.0  --  8.7  GLUCOSE 440*  --  131*    CBG (last 3)   Recent Labs  01/11/15 0013 01/11/15 0414 01/11/15 0759  GLUCAP 90 197* 118*    Scheduled Meds: . aspirin EC  81 mg Oral Daily  . cefTRIAXone (ROCEPHIN)  IV  1 g Intravenous Q24H  . dextromethorphan-guaiFENesin  1 tablet Oral BID  . docusate sodium  100 mg Oral BID  . heparin  5,000 Units Subcutaneous 3 times per day  . insulin aspart  0-15 Units Subcutaneous 6 times per day  . insulin aspart  3 Units Subcutaneous TID WC  . insulin glargine  10 Units Subcutaneous QHS  . insulin starter kit- syringes  1 kit Other Once  . living well with diabetes book- in spanish   Does not apply Once  . metoprolol  100 mg Oral BID  . pneumococcal 23 valent vaccine  0.5 mL Intramuscular Tomorrow-1000  . sodium chloride  3 mL Intravenous Q12H    Continuous Infusions:   Past Medical History  Diagnosis Date  . Hypertension   . Diabetes mellitus without complication     Past Surgical History  Procedure Laterality Date  . Abdominal hysterectomy      Chanah Tidmore A. Jimmye Norman, RD, LDN, CDE Pager: [phone] After hours Pager: [phone]

## 2015-01-11 NOTE — Progress Notes (Signed)
Visited patient in room.  Patient sound asleep, daughter with her. Spoke with daughter. Had ordered Living Well with Diabetes in Spanish, ordered to watch DM videos in Spanish, and ordered insulin starter kit (syringes) in [place].  Staff RN to have patient give lunch injection of insulin. Will try to speak with patient and daughter after lunch. Will follow. Harvel Ricks RN BSN CDE

## 2015-01-11 NOTE — Progress Notes (Signed)
Worked with patient on drawing up practice dosage of insulin.  Was not very accurate in dosage. Another daughter was in the room. I had her try the dosage and she did very well with it.  Daughter states that her mother is never alone and they can help her with insulin. Spoke with Staff RN about having patient give herself the insulin at dinner time and to practice with her. Patient watching Spanish videos and received Spanish literature.  Will continue to follow while in hospital. Smith MinceKendra Anayah Arvanitis RN BSN CDE

## 2015-01-11 NOTE — Progress Notes (Signed)
PROGRESS NOTE  Samantha [name] WUJ:[id number] DOB: [birth year] DOA: 01/10/2015 PCP: Lavell Islam, MD  hx provided by daughter at bedside   Brief narrative 64 year old Hispanic female who moved from [place] 2 months back with history of hypertension, diabetes mellitus and history of UTI presented with urinary frequency with towel smell, polydipsia subjective fevers with vomiting and blurred vision. Patient was septic in the ED with fever of 102.4  Fahrenheit, white count of 16.4 K and tachycardic. Blood glucose was greater than 400. Admitted for  sepsis likely secondary to UTI with pyelonephritis.  Assessment/Plan: Sepsis secondary to UTI / pyelonephritis Febrile with  Temp of 102.9 F this am but feeling better overall. . Afebrile since. Blood cultures and urine cultures are pending. Will continue emperic antibiotic therapy. 40 care with IV fluids and Tylenol.   Uncontrolled Diabetes Mellitus with CBG 400+ and glucosuria Patient's CBG was quickly controlled with Lantus and NovoLog. Hgb A1C is 10.1. Suspect she will need insulin. Diabetic Coordinator consult pending. Have requested insulin teaching and diabetic videos via RN. Family requested a nutrition consult. The family speaks predominantly Spanish  Elevated troponin Peak of 0.06 and trended down 0.05>>0.04. No chest pain symptoms or EKG changes. Seems to be demand ischemia secondary to sepsis.  Constipation Patient reported no bowel movement 5 days on admission. She had a small bowel movement today. Will continue stool softeners.  URI Patient with cough, mild sore throat, left ear pain. Treat supportively.  Left shoulder pain with slight increase in troponin. Patient reports shoulder pain is resolved today. Troponins were minimally elevated. EKG shows normal sinus rhythm with no appearance of ST elevation or depression. Doubt ACS. Probably musculoskeletal. 2-D echo pending.  Diabetic ulcer of left fourth toe Appreciate  wound care consultation. Will follow recommendations     DVT Prophylaxis:  Heparin  Code Status: Full code Family Communication: Son, daughter, grandchildren at bedside Disposition Plan: To home when able.  PT recommends no follow-up. [facility] already scheduled thanks to case management.   Consultants: None Procedures: 2-D echo pending  Antibiotics: Anti-infectives    Start     Dose/Rate Route Frequency Ordered Stop   01/11/15 1000  cefTRIAXone (ROCEPHIN) 1 g in dextrose 5 % 50 mL IVPB - Premix     1 g100 mL/hr over 30 Minutes Intravenous Every 24 hours 01/10/15 1726     01/10/15 1730  cefTRIAXone (ROCEPHIN) 1 g in dextrose 5 % 50 mL IVPB - Premix  Status:  Discontinued     1 g100 mL/hr over 30 Minutes Intravenous Every 24 hours 01/10/15 1723 01/10/15 1726   01/10/15 1400  cefTRIAXone (ROCEPHIN) 1 g in dextrose 5 % 50 mL IVPB     1 g100 mL/hr over 30 Minutes Intravenous  Once 01/10/15 1351 01/10/15 1624     HPI/Subjective: Patient reports feeling much better. No dysuria. However she has no appetite.  Objective: Filed Vitals:   01/11/15 0801 01/11/15 1035 01/11/15 1101 01/11/15 1217  BP: 154/51 150/62  135/53  Pulse: 91 92  64  Temp: 102.9 F (39.4 C)  98.2 F (36.8 C) 97.9 F (36.6 C)  TempSrc: Oral  Oral   Resp: 18   18  Height:      Weight:      SpO2: 98%   98%    Intake/Output Summary (Last 24 hours) at 01/11/15 1434 Last data filed at 01/11/15 0015  Gross per 24 hour  Intake    460 ml  Output    445 ml  Net     15 ml   Filed Weights   01/10/15 1029  Weight: 58.06 kg (128 lb)    Exam: General: Well developed, well nourished, NAD, appears stated age . Speaks Spanish HEENT:  PERR, EOMI, Anicteic Sclera, MMM. No pharyngeal erythema or exudates  Neck: Supple, no JVD, no masses  Cardiovascular: RRR, S1 S2 auscultated, no rubs, murmurs or gallops.   Respiratory: Clear to auscultation bilaterally with equal chest rise    Abdomen: Soft, nontender, nondistended, + bowel sounds  Extremities: warm dry without cyanosis clubbing or edema.  Neuro: AAOx3, cranial nerves grossly intact. Strength 5/5 in upper and lower extremities  Skin: Without rashes exudates or nodules.  ancanthosis nigracans noted on top of back. Psych: Normal affect and demeanor with intact judgement and insight       Data Reviewed: Basic Metabolic Panel:  Recent Labs Lab 01/10/15 1104 01/10/15 1911 01/11/15 0635  NA 132*  --  138  K 4.1  --  3.8  CL 101  --  104  CO2 19  --  23  GLUCOSE 440*  --  131*  BUN 13  --  12  CREATININE 1.13* 1.09 0.97  CALCIUM 9.0  --  8.7   Liver Function Tests:  Recent Labs Lab 01/10/15 1104 01/11/15 0635  AST 22 30  ALT 20 19  ALKPHOS 98 75  BILITOT 0.8 0.4  PROT 7.6 7.3  ALBUMIN 3.6 3.3*   CBC:  Recent Labs Lab 01/10/15 1104 01/11/15 0635  WBC 16.4* 11.8*  NEUTROABS 14.4*  --   HGB 12.9 12.4  HCT 35.7* 35.4*  MCV 88.6 89.6  PLT 353 340   Cardiac Enzymes:  Recent Labs Lab 01/10/15 1911 01/11/15 0855 01/11/15 1314  TROPONINI 0.06* 0.05* 0.04*   CBG:  Recent Labs Lab 01/10/15 1927 01/11/15 0013 01/11/15 0414 01/11/15 0759 01/11/15 1212  GLUCAP 159* 90 197* 118* 157*    Recent Results (from the past 240 hour(s))  Blood Culture (routine x 2)     Status: None (Preliminary result)   Collection Time: 01/10/15 11:00 AM  Result Value Ref Range Status   Specimen Description BLOOD RIGHT ANTECUBITAL  Final   Special Requests BOTTLES DRAWN AEROBIC AND ANAEROBIC 10MLS  Final   Culture   Final           BLOOD CULTURE RECEIVED NO GROWTH TO DATE CULTURE WILL BE HELD FOR 5 DAYS BEFORE ISSUING A FINAL NEGATIVE REPORT Performed at [facility]    Report Status PENDING  Incomplete  Blood Culture (routine x 2)     Status: None (Preliminary result)   Collection Time: 01/10/15 11:15 AM  Result Value Ref Range Status   Specimen Description BLOOD ARM LEFT  Final    Special Requests BOTTLES DRAWN AEROBIC AND ANAEROBIC 2CC  Final   Culture   Final           BLOOD CULTURE RECEIVED NO GROWTH TO DATE CULTURE WILL BE HELD FOR 5 DAYS BEFORE ISSUING A FINAL NEGATIVE REPORT Performed at [facility]    Report Status PENDING  Incomplete     Studies: Dg Chest Port 1 View  01/10/2015   CLINICAL DATA:  Fever.  Sepsis.  EXAM: PORTABLE CHEST - 1 VIEW  COMPARISON:  None.  FINDINGS: Heart size and pulmonary vascularity are normal. Lungs are clear. No osseous abnormality. The patient has taken a shallow inspiration.  IMPRESSION: Normal exam.   Electronically  Signed   By: Geanie Cooley M.D.   On: 01/10/2015 11:17   Dg Abd Portable 1v  01/11/2015   CLINICAL DATA:  Constipation, vomiting.  EXAM: PORTABLE ABDOMEN - 1 VIEW  COMPARISON:  None.  FINDINGS: The bowel gas pattern is normal. No radio-opaque calculi or other significant radiographic abnormality are seen.  IMPRESSION: No evidence of bowel obstruction or ileus.   Electronically Signed   By: Roque Lias M.D.   On: 01/11/2015 08:15   Dg Foot Complete Left  01/10/2015   CLINICAL DATA:  Sepsis, fever, infected fourth toe  EXAM: LEFT FOOT - COMPLETE 3+ VIEW  COMPARISON:  None.  FINDINGS: Three views of the left foot submitted. No acute fracture or subluxation. Small plantar spur of calcaneus. Mild soft tissue swelling fourth toe. No definite bone destruction to suggest osteomyelitis.  IMPRESSION: No acute fracture or subluxation. Mild soft tissue swelling fourth toe. No definite bone destruction to suggest osteomyelitis. Small plantar spur of calcaneus.   Electronically Signed   By: Natasha Mead M.D.   On: 01/10/2015 11:32    Scheduled Meds: . aspirin EC  81 mg Oral Daily  . cefTRIAXone (ROCEPHIN)  IV  1 g Intravenous Q24H  . dextromethorphan-guaiFENesin  1 tablet Oral BID  . docusate sodium  100 mg Oral BID  . heparin  5,000 Units Subcutaneous 3 times per day  . insulin aspart  0-15 Units Subcutaneous 6 times per  day  . insulin aspart  3 Units Subcutaneous TID WC  . insulin glargine  10 Units Subcutaneous QHS  . metoprolol  100 mg Oral BID  . pneumococcal 23 valent vaccine  0.5 mL Intramuscular Tomorrow-1000  . sodium chloride  3 mL Intravenous Q12H   Continuous Infusions:   Principal Problem:   SIRS (systemic inflammatory response syndrome) Active Problems:   Pyelonephritis   Diabetic toe ulcer   Constipation   Uncontrolled diabetes mellitus    Conley Canal   [facility] Pager [phone]. If 7PM-7AM, please contact night-coverage at [email or web address], password [facility] 01/11/2015, 2:34 PM  LOS: 1 day

## 2015-01-12 DIAGNOSIS — R7881 Bacteremia: Secondary | ICD-10-CM

## 2015-01-12 DIAGNOSIS — I519 Heart disease, unspecified: Secondary | ICD-10-CM

## 2015-01-12 DIAGNOSIS — B9620 Unspecified Escherichia coli [E. coli] as the cause of diseases classified elsewhere: Secondary | ICD-10-CM

## 2015-01-12 HISTORY — DX: Bacteremia: R78.81

## 2015-01-12 HISTORY — DX: Unspecified Escherichia coli (E. coli) as the cause of diseases classified elsewhere: B96.20

## 2015-01-12 LAB — CBC
HCT: 31.4 % — ABNORMAL LOW (ref 36.0–46.0)
HEMOGLOBIN: 10.9 g/dL — AB (ref 12.0–15.0)
MCH: 30.5 pg (ref 26.0–34.0)
MCHC: 34.7 g/dL (ref 30.0–36.0)
MCV: 88 fL (ref 78.0–100.0)
PLATELETS: 307 10*3/uL (ref 150–400)
RBC: 3.57 MIL/uL — ABNORMAL LOW (ref 3.87–5.11)
RDW: 12.8 % (ref 11.5–15.5)
WBC: 5.9 10*3/uL (ref 4.0–10.5)

## 2015-01-12 LAB — GLUCOSE, CAPILLARY
GLUCOSE-CAPILLARY: 172 mg/dL — AB (ref 70–99)
GLUCOSE-CAPILLARY: 121 mg/dL — AB (ref 70–99)
GLUCOSE-CAPILLARY: 189 mg/dL — AB (ref 70–99)
Glucose-Capillary: 317 mg/dL — ABNORMAL HIGH (ref 70–99)

## 2015-01-12 LAB — URINE CULTURE: Colony Count: >=100000

## 2015-01-12 MED ORDER — GLUCERNA SHAKE PO LIQD
237.0000 mL | Freq: Three times a day (TID) | ORAL | Status: DC
  Administered 2015-01-12 – 2015-01-13 (×5): 237 mL via ORAL

## 2015-01-12 NOTE — Progress Notes (Signed)
PROGRESS NOTE  Samantha CrandallMaria [name] ZOX:[id number] DOB: December 08, 1951 DOA: 01/10/2015 PCP: Lavell IslamLOWARD,DAVIS L, MD  hx provided by daughter at bedside   Brief narrative 64 year old Hispanic female who moved from [place] 2 months back with history of hypertension, diabetes mellitus and history of UTI presented with urinary frequency with towel smell, polydipsia subjective fevers with vomiting and blurred vision. Patient was septic in the ED with fever of 102.4  Fahrenheit, white count of 16.4 K and tachycardic. Blood glucose was greater than 400.  Admitted for  sepsis likely secondary to UTI with pyelonephritis.  Assessment/Plan: Sepsis secondary to UTI / pyelonephritis Febrile at 100.9, but fever curve is trending down. Patient reports she feels better.  One of 2 blood cultures shows Escherichia coli. Sensitivities are pending. Urine culture shows Escherichia coli, sensitivities pending. Will continue Rocephin.  Uncontrolled Diabetes Mellitus with CBG 400+ and glucosuria Patient's CBG was quickly controlled with Lantus and NovoLog. Hgb A1C is 10.1. Suspect she will need insulin at discharge.The patient has received diabetic education and counsel from the diabetic coordinator and nursing staff.  Thus far she has refused to give herself insulin, however, she is surrounded by family 24 hours a day who  have offered to give her insulin to her.  She has a hospital follow-up appointment already [facility] on [facility].  Elevated troponin Peak of 0.06 and trended down 0.05>>0.04. No chest pain symptoms or EKG changes. Seems to be demand ischemia secondary to sepsis. 2-D echo shows grade 1 diastolic dysfunction with preserved left ventricular ejection fraction. No mention of vegetations.  Constipation Patient reported no bowel movement 5 days on admission. Resolved with stool softeners.  URI Patient with cough, mild sore throat, left ear pain. Treat  supportively.  Diabetic ulcer of left fourth toe Appreciate wound care consultation. Will follow recommendations   DVT Prophylaxis:  Heparin  Code Status: Full code Family Communication: Daughter-in-law bedside. She translated for her mother.  Disposition Plan: To home when able.  PT recommends no follow-up. [facility] is already scheduled thanks to case management.   Consultants: None Procedures: 2-D echo   Antibiotics: Anti-infectives    Start     Dose/Rate Route Frequency Ordered Stop   01/11/15 1000  cefTRIAXone (ROCEPHIN) 1 g in dextrose 5 % 50 mL IVPB - Premix     1 g100 mL/hr over 30 Minutes Intravenous Every 24 hours 01/10/15 1726     01/10/15 1730  cefTRIAXone (ROCEPHIN) 1 g in dextrose 5 % 50 mL IVPB - Premix  Status:  Discontinued     1 g100 mL/hr over 30 Minutes Intravenous Every 24 hours 01/10/15 1723 01/10/15 1726   01/10/15 1400  cefTRIAXone (ROCEPHIN) 1 g in dextrose 5 % 50 mL IVPB     1 g100 mL/hr over 30 Minutes Intravenous  Once 01/10/15 1351 01/10/15 1624     HPI/Subjective: Patient reports feeling much better. She has been ambulating about the room  Objective: Filed Vitals:   01/11/15 1724 01/11/15 2132 01/12/15 0540 01/12/15 1440  BP: 179/63 153/70 147/56 161/65  Pulse: 67 72 66 65  Temp: 100.5 F (38.1 C) 100 F (37.8 C) 99.6 F (37.6 C) 98.6 F (37 C)  TempSrc: Oral   Oral  Resp: 18 18 18 18   Height:      Weight:      SpO2: 97% 99% 97% 100%   No intake or output data in the 24 hours ending  01/12/15 1848 Filed Weights   01/10/15 1029  Weight: 58.06 kg (128 lb)    Exam: General: Well developed, well nourished, NAD, appears stated age . daughter at bedside  HEENT:  PERR, EOMI, Anicteic Sclera, MMM. No pharyngeal erythema or exudates  Neck: Supple, no JVD, no masses  Cardiovascular: RRR, S1 S2 auscultated, no rubs, murmurs or gallops.   Respiratory: Clear to auscultation bilaterally with equal chest rise    Abdomen: Soft, nontender, nondistended, + bowel sounds  Extremities: warm dry without cyanosis clubbing or edema.  Skin: Without rashes exudates or nodules.  ancanthosis nigracans noted on top of back.    Data Reviewed: Basic Metabolic Panel:  Recent Labs Lab 01/10/15 1104 01/10/15 1911 01/11/15 0635  NA 132*  --  138  K 4.1  --  3.8  CL 101  --  104  CO2 19  --  23  GLUCOSE 440*  --  131*  BUN 13  --  12  CREATININE 1.13* 1.09 0.97  CALCIUM 9.0  --  8.7   Liver Function Tests:  Recent Labs Lab 01/10/15 1104 01/11/15 0635  AST 22 30  ALT 20 19  ALKPHOS 98 75  BILITOT 0.8 0.4  PROT 7.6 7.3  ALBUMIN 3.6 3.3*   CBC:  Recent Labs Lab 01/10/15 1104 01/11/15 0635 01/12/15 0900  WBC 16.4* 11.8* 5.9  NEUTROABS 14.4*  --   --   HGB 12.9 12.4 10.9*  HCT 35.7* 35.4* 31.4*  MCV 88.6 89.6 88.0  PLT 353 340 307   Cardiac Enzymes:  Recent Labs Lab 01/10/15 1911 01/11/15 0855 01/11/15 1314 01/11/15 2031  TROPONINI 0.06* 0.05* 0.04* 0.04*   CBG:  Recent Labs Lab 01/11/15 2011 01/11/15 2145 01/12/15 0821 01/12/15 1205 01/12/15 1713  GLUCAP 136* 117* 172* 317* 121*    Recent Results (from the past 240 hour(s))  Blood Culture (routine x 2)     Status: None (Preliminary result)   Collection Time: 01/10/15 11:00 AM  Result Value Ref Range Status   Specimen Description BLOOD RIGHT ANTECUBITAL  Final   Special Requests BOTTLES DRAWN AEROBIC AND ANAEROBIC  Final   Culture   Final           BLOOD CULTURE RECEIVED NO GROWTH TO DATE CULTURE WILL BE HELD FOR 5 DAYS BEFORE ISSUING A FINAL NEGATIVE REPORT Performed at [facility]    Report Status PENDING  Incomplete  Blood Culture (routine x 2)     Status: None (Preliminary result)   Collection Time: 01/10/15 11:15 AM  Result Value Ref Range Status   Specimen Description BLOOD ARM LEFT  Final   Special Requests BOTTLES DRAWN AEROBIC AND ANAEROBIC 2CC  Final   Culture   Final    ESCHERICHIA  COLI Note: CRITICAL RESULT CALLED TO, READ BACK BY AND VERIFIED WITH: THELMA J RN  VINCJ Performed at [facility]    Report Status PENDING  Incomplete  Urine culture     Status: None   Collection Time: 01/10/15  1:15 PM  Result Value Ref Range Status   Specimen Description URINE, CLEAN CATCH  Final   Special Requests NONE  Final   Colony Count   Final    >=100,000 COLONIES/ML Performed at [facility]    Culture   Final    ESCHERICHIA COLI Performed at [facility]    Report Status 01/12/2015 FINAL  Final   Organism ID, Bacteria ESCHERICHIA COLI  Final  Susceptibility   Escherichia coli - MIC*    AMPICILLIN >=32 RESISTANT Resistant     CEFAZOLIN <=4 SENSITIVE Sensitive     CEFTRIAXONE <=1 SENSITIVE Sensitive     CIPROFLOXACIN <=0.25 SENSITIVE Sensitive     GENTAMICIN >=16 RESISTANT Resistant     LEVOFLOXACIN 0.5 SENSITIVE Sensitive     NITROFURANTOIN <=16 SENSITIVE Sensitive     TOBRAMYCIN 4 SENSITIVE Sensitive     TRIMETH/SULFA >=320 RESISTANT Resistant     PIP/TAZO <=4 SENSITIVE Sensitive     * ESCHERICHIA COLI     Studies: Dg Abd Portable 1v  01/11/2015   CLINICAL DATA:  Constipation, vomiting.  EXAM: PORTABLE ABDOMEN - 1 VIEW  COMPARISON:  None.  FINDINGS: The bowel gas pattern is normal. No radio-opaque calculi or other significant radiographic abnormality are seen.  IMPRESSION: No evidence of bowel obstruction or ileus.   Electronically Signed   By: Roque Lias M.D.   On: 01/11/2015 08:15    Scheduled Meds: . aspirin EC  81 mg Oral Daily  . cefTRIAXone (ROCEPHIN)  IV  1 g Intravenous Q24H  . dextromethorphan-guaiFENesin  1 tablet Oral BID  . docusate sodium  100 mg Oral BID  . feeding supplement (GLUCERNA SHAKE)  237 mL Oral TID BM  . heparin  5,000 Units Subcutaneous 3 times per day  . insulin aspart  0-15 Units Subcutaneous TID WC  . insulin aspart  3 Units Subcutaneous TID WC  . insulin glargine  10 Units Subcutaneous QHS    . metoprolol  100 mg Oral BID  . pneumococcal 23 valent vaccine  0.5 mL Intramuscular Tomorrow-1000  . sodium chloride  3 mL Intravenous Q12H   Continuous Infusions:   Principal Problem:   SIRS (systemic inflammatory response syndrome) Active Problems:   Bacteremia, escherichia coli   Pyelonephritis   Diabetic toe ulcer   Constipation   Uncontrolled diabetes mellitus   Protein-calorie malnutrition, severe    Bernadene, [place]   [facility] Pager [phone]. If 7PM-7AM, please contact night-coverage at [email or web address], password [facility] 01/12/2015, 6:48 PM  LOS: 2 days

## 2015-01-12 NOTE — Progress Notes (Signed)
Nutrition Brief Note: Spoke with pt's RN who reports that Resource Breeze elevated blood sugar, requests change to Whole Foodslucerna Shake, will order.  Unit RD to follow up. Kendell BaneHeather Klara Stjames RD, LDN, CNSC [phone] Pager [phone] After Hours Pager

## 2015-01-12 NOTE — Progress Notes (Signed)
  Echocardiogram 2D Echocardiogram has been performed.  Samantha [name], Samantha [name] 01/12/2015, 11:57 AM

## 2015-01-12 NOTE — Progress Notes (Signed)
Pt refused to self inject insulin. Stated her hands were shaky and did not want to do it now. RN encouraged pt to attempt injection. Pt continued to refuse.

## 2015-01-13 DIAGNOSIS — I10 Essential (primary) hypertension: Secondary | ICD-10-CM | POA: Diagnosis not present

## 2015-01-13 LAB — IRON AND TIBC
Iron: 41 ug/dL — ABNORMAL LOW (ref 42–145)
Saturation Ratios: 17 % — ABNORMAL LOW (ref 20–55)
TIBC: 237 ug/dL — ABNORMAL LOW (ref 250–470)
UIBC: 196 ug/dL (ref 125–400)

## 2015-01-13 LAB — GLUCOSE, CAPILLARY
GLUCOSE-CAPILLARY: 180 mg/dL — AB (ref 70–99)
GLUCOSE-CAPILLARY: 246 mg/dL — AB (ref 70–99)
GLUCOSE-CAPILLARY: 295 mg/dL — AB (ref 70–99)
GLUCOSE-CAPILLARY: 254 mg/dL — AB (ref 70–99)
Glucose-Capillary: 210 mg/dL — ABNORMAL HIGH (ref 70–99)

## 2015-01-13 MED ORDER — LISINOPRIL 20 MG PO TABS
20.0000 mg | ORAL_TABLET | Freq: Every day | ORAL | Status: DC
  Administered 2015-01-14: 20 mg via ORAL
  Filled 2015-01-13: qty 1

## 2015-01-13 MED ORDER — MAGNESIUM HYDROXIDE 400 MG/5ML PO SUSP
30.0000 mL | Freq: Once | ORAL | Status: AC
  Administered 2015-01-13: 30 mL via ORAL
  Filled 2015-01-13: qty 30

## 2015-01-13 MED ORDER — HYDRALAZINE HCL 25 MG PO TABS
25.0000 mg | ORAL_TABLET | Freq: Once | ORAL | Status: AC
  Administered 2015-01-13: 25 mg via ORAL
  Filled 2015-01-13: qty 1

## 2015-01-13 MED ORDER — CIPROFLOXACIN HCL 500 MG PO TABS
500.0000 mg | ORAL_TABLET | Freq: Two times a day (BID) | ORAL | Status: DC
  Administered 2015-01-13 – 2015-01-14 (×3): 500 mg via ORAL
  Filled 2015-01-13 (×5): qty 1

## 2015-01-13 MED ORDER — INSULIN GLARGINE 100 UNIT/ML ~~LOC~~ SOLN
14.0000 [IU] | Freq: Every day | SUBCUTANEOUS | Status: DC
  Filled 2015-01-13: qty 0.14

## 2015-01-13 MED ORDER — INSULIN GLARGINE 100 UNIT/ML ~~LOC~~ SOLN
18.0000 [IU] | Freq: Every day | SUBCUTANEOUS | Status: DC
  Administered 2015-01-13: 18 [IU] via SUBCUTANEOUS
  Filled 2015-01-13: qty 0.18

## 2015-01-13 MED ORDER — INSULIN ASPART 100 UNIT/ML ~~LOC~~ SOLN: 5.0000 [IU] | Freq: Three times a day (TID) | SUBCUTANEOUS | Status: DC

## 2015-01-13 MED ORDER — LISINOPRIL 5 MG PO TABS
5.0000 mg | ORAL_TABLET | Freq: Every day | ORAL | Status: DC
  Administered 2015-01-13: 5 mg via ORAL
  Filled 2015-01-13: qty 1

## 2015-01-13 NOTE — Progress Notes (Signed)
BP 171/64 and Temperature 100.4. Within parameters set for PRN medication administration. Will continue to monitor.

## 2015-01-13 NOTE — Progress Notes (Signed)
PROGRESS NOT  Samantha [name] ZOX:[id number] DOB: 12/24/1951 DOA: 01/10/2015 PCP: Lavell Islam, MD  hx provided by daughter at bedside   Brief narrative 64 year old Hispanic female who moved from [place] 2 months back with history of hypertension, diabetes mellitus and history of UTI presented with urinary frequency with towel smell, polydipsia subjective fevers with vomiting and blurred vision. Patient was septic in the ED with fever of 102.4  Fahrenheit, white count of 16.4 K and tachycardic. Blood glucose was greater than 400.  Admitted for  sepsis likely secondary to UTI with pyelonephritis.  Assessment/Plan: Sepsis secondary to UTI / pyelonephritis Still with fever at 100.4 this am.  One of 2 blood cultures shows Escherichia coli. Urine culture shows  Escherichia coli, sensitive to Rocephin & Cipro.  Patient has received 4 doses of IV Rocephin.  Will place on oral Cipro today as she has lost her IV access.  Uncontrolled Diabetes Mellitus with CBG 400+ and glucosuria Patient's CBG was quickly controlled with Lantus and NovoLog. Hgb A1C is 10.1. Suspect she will need insulin at discharge.The patient has received diabetic education and counsel from the diabetic coordinator and nursing staff.  Thus far she has had difficulty giving herself insulin, however, she is surrounded by family 24 hours a day who have offered to give her insulin to her.   1/17 cbgs rising. Will titrate Lantus dose up to 14 units.   She has a hospital follow-up appointment already scheduled [facility] on January [facility].  Elevated troponin Peak of 0.06 and trended down 0.05>>0.04. No chest pain symptoms or EKG changes. Seems to be demand ischemia secondary to sepsis. 2-D echo shows grade 1 diastolic dysfunction with preserved left ventricular ejection fraction. No mention of vegetations.  Constipation Patient reported no bowel movement 5 days on admission. On stool softeners.   Will give 1 dose of MOM.  URI Patient with cough, mild sore throat, left ear pain. Treat supportively.  Diabetic ulcer of left fourth toe Appreciate wound care consultation. Will follow recommendations   DVT Prophylaxis:  Heparin  Code Status: Full code Family Communication: Daughter-in-law bedside. She translated for her mother.  Disposition Plan: To home when able.  PT recommends no follow-up. [facility]-up [facility] is already scheduled thanks to case management.   Consultants: None Procedures: 2-D echo   Antibiotics: Anti-infectives    Start     Dose/Rate Route Frequency Ordered Stop   01/13/15 1030  ciprofloxacin (CIPRO) tablet 500 mg     500 mg Oral 2 times daily 01/13/15 1022     01/11/15 1000  cefTRIAXone (ROCEPHIN) 1 g in dextrose 5 % 50 mL IVPB - Premix  Status:  Discontinued     1 g100 mL/hr over 30 Minutes Intravenous Every 24 hours 01/10/15 1726 01/13/15 1022   01/10/15 1730  cefTRIAXone (ROCEPHIN) 1 g in dextrose 5 % 50 mL IVPB - Premix  Status:  Discontinued     1 g100 mL/hr over 30 Minutes Intravenous Every 24 hours 01/10/15 1723 01/10/15 1726   01/10/15 1400  cefTRIAXone (ROCEPHIN) 1 g in dextrose 5 % 50 mL IVPB     1 g100 mL/hr over 30 Minutes Intravenous  Once 01/10/15 1351 01/10/15 1624     HPI/Subjective: Patient reports feeling much better. Sad that she has had to stay in the hospital so long.  Objective: Filed Vitals:   01/12/15 0540 01/12/15 1440 01/12/15 2152 01/13/15 0605  BP: 147/56 161/65 174/68 171/64  Pulse: 66 65 73 68  Temp: 99.6 F (37.6 C) 98.6 F (37 C) 98.7 F (37.1 C) 100.4 F (38 C)  TempSrc:  Oral Oral Oral  Resp: 18 18 18 18   Height:      Weight:      SpO2: 97% 100% 100% 98%    Intake/Output Summary (Last 24 hours) at 01/13/15 1242 Last data filed at 01/13/15 1119  Gross per 24 hour  Intake      0 ml  Output      0 ml  Net      0 ml   Filed Weights   01/10/15 1029  Weight: 58.06 kg (128 lb)     Exam: General: Well developed, well nourished, NAD, appears stated age . daughter at bedside.  Patient smiling. HEENT:  PERR, EOMI, Anicteic Sclera, MMM. No pharyngeal erythema or exudates  Neck: Supple, no JVD, no masses  Cardiovascular: RRR, S1 S2 auscultated, no rubs, murmurs or gallops.   Respiratory: Clear to auscultation bilaterally with equal chest rise  Abdomen: Soft, nontender, nondistended, + bowel sounds  Extremities: warm dry without cyanosis clubbing or edema.  Skin: Without rashes exudates or nodules.  ancanthosis nigracans noted on top of back.    Data Reviewed: Basic Metabolic Panel:  Recent Labs Lab 01/10/15 1104 01/10/15 1911 01/11/15 0635  NA 132*  --  138  K 4.1  --  3.8  CL 101  --  104  CO2 19  --  23  GLUCOSE 440*  --  131*  BUN 13  --  12  CREATININE 1.13* 1.09 0.97  CALCIUM 9.0  --  8.7   Liver Function Tests:  Recent Labs Lab 01/10/15 1104 01/11/15 0635  AST 22 30  ALT 20 19  ALKPHOS 98 75  BILITOT 0.8 0.4  PROT 7.6 7.3  ALBUMIN 3.6 3.3*   CBC:  Recent Labs Lab 01/10/15 1104 01/11/15 0635 01/12/15 0900  WBC 16.4* 11.8* 5.9  NEUTROABS 14.4*  --   --   HGB 12.9 12.4 10.9*  HCT 35.7* 35.4* 31.4*  MCV 88.6 89.6 88.0  PLT 353 340 307   Cardiac Enzymes:  Recent Labs Lab 01/10/15 1911 01/11/15 0855 01/11/15 1314 01/11/15 2031  TROPONINI 0.06* 0.05* 0.04* 0.04*   CBG:  Recent Labs Lab 01/12/15 1713 01/12/15 2158 01/13/15 0325 01/13/15 0748 01/13/15 1204  GLUCAP 121* 189* 180* 246* 295*    Recent Results (from the past 240 hour(s))  Blood Culture (routine x 2)     Status: None (Preliminary result)   Collection Time: 01/10/15 11:00 AM  Result Value Ref Range Status   Specimen Description BLOOD RIGHT ANTECUBITAL  Final   Special Requests BOTTLES DRAWN AEROBIC AND ANAEROBIC 10MLS  Final   Culture   Final           BLOOD CULTURE RECEIVED NO GROWTH TO DATE CULTURE WILL BE HELD FOR 5 DAYS BEFORE ISSUING A FINAL  NEGATIVE REPORT Performed at [facility]    Report Status PENDING  Incomplete  Blood Culture (routine x 2)     Status: None (Preliminary result)   Collection Time: 01/10/15 11:15 AM  Result Value Ref Range Status   Specimen Description BLOOD ARM LEFT  Final   Special Requests BOTTLES DRAWN AEROBIC AND ANAEROBIC 2CC  Final   Culture   Final    ESCHERICHIA COLI Note: CRITICAL RESULT CALLED TO, READ BACK BY AND VERIFIED WITH: THELMA J RN @503PM  VINCJ Performed at [facility]  Report Status PENDING  Incomplete  Urine culture     Status: None   Collection Time: 01/10/15  1:15 PM  Result Value Ref Range Status   Specimen Description URINE, CLEAN CATCH  Final   Special Requests NONE  Final   Colony Count   Final    >=100,000 COLONIES/ML Performed at [facility]    Culture   Final    ESCHERICHIA COLI Performed at [facility]    Report Status 01/12/2015 FINAL  Final   Organism ID, Bacteria ESCHERICHIA COLI  Final      Susceptibility   Escherichia coli - MIC*    AMPICILLIN >=32 RESISTANT Resistant     CEFAZOLIN <=4 SENSITIVE Sensitive     CEFTRIAXONE <=1 SENSITIVE Sensitive     CIPROFLOXACIN <=0.25 SENSITIVE Sensitive     GENTAMICIN >=16 RESISTANT Resistant     LEVOFLOXACIN 0.5 SENSITIVE Sensitive     NITROFURANTOIN <=16 SENSITIVE Sensitive     TOBRAMYCIN 4 SENSITIVE Sensitive     TRIMETH/SULFA >=320 RESISTANT Resistant     PIP/TAZO <=4 SENSITIVE Sensitive     * ESCHERICHIA COLI     Studies: No results found.  Scheduled Meds: . aspirin EC  81 mg Oral Daily  . ciprofloxacin  500 mg Oral BID  . dextromethorphan-guaiFENesin  1 tablet Oral BID  . docusate sodium  100 mg Oral BID  . feeding supplement (GLUCERNA SHAKE)  237 mL Oral TID BM  . heparin  5,000 Units Subcutaneous 3 times per day  . insulin aspart  0-15 Units Subcutaneous TID WC  . insulin aspart  3 Units Subcutaneous TID WC  . insulin glargine  14 Units Subcutaneous QHS  .  lisinopril  5 mg Oral Daily  . magnesium hydroxide  30 mL Oral Once  . metoprolol  100 mg Oral BID  . pneumococcal 23 valent vaccine  0.5 mL Intramuscular Tomorrow-1000  . sodium chloride  3 mL Intravenous Q12H   Continuous Infusions:   Principal Problem:   SIRS (systemic inflammatory response syndrome) Active Problems:   Bacteremia, escherichia coli   Pyelonephritis   Diabetic toe ulcer   Constipation   Uncontrolled diabetes mellitus   Protein-calorie malnutrition, severe    Samantha, [name]   [facility] Pager [phone]. If 7PM-7AM, please contact night-coverage at [email or web address], password [facility] 01/13/2015, 12:42 PM  LOS: 3 days

## 2015-01-13 NOTE — Progress Notes (Signed)
IV infiltrated.  Dr. Gonzella Lexhungel aware.  No new IV at this time.

## 2015-01-14 DIAGNOSIS — N10 Acute tubulo-interstitial nephritis: Secondary | ICD-10-CM | POA: Insufficient documentation

## 2015-01-14 LAB — GLUCOSE, CAPILLARY
GLUCOSE-CAPILLARY: 356 mg/dL — AB (ref 70–99)
Glucose-Capillary: 179 mg/dL — ABNORMAL HIGH (ref 70–99)

## 2015-01-14 LAB — CULTURE, BLOOD (ROUTINE X 2)

## 2015-01-14 MED ORDER — GLIMEPIRIDE 4 MG PO TABS
4.0000 mg | ORAL_TABLET | Freq: Every day | ORAL | Status: DC
  Administered 2015-01-14: 4 mg via ORAL
  Filled 2015-01-14 (×2): qty 1

## 2015-01-14 MED ORDER — DSS 100 MG PO CAPS: 100.0000 mg | ORAL_CAPSULE | Freq: Every day | ORAL | Status: DC

## 2015-01-14 MED ORDER — INSULIN ASPART 100 UNIT/ML ~~LOC~~ SOLN
10.0000 [IU] | Freq: Once | SUBCUTANEOUS | Status: AC
  Administered 2015-01-14: 10 [IU] via SUBCUTANEOUS

## 2015-01-14 MED ORDER — GLIMEPIRIDE 4 MG PO TABS: 4.0000 mg | ORAL_TABLET | Freq: Every day | ORAL | Status: DC

## 2015-01-14 MED ORDER — METFORMIN HCL 850 MG PO TABS: 850.0000 mg | ORAL_TABLET | Freq: Two times a day (BID) | ORAL | Status: DC

## 2015-01-14 MED ORDER — METFORMIN HCL 850 MG PO TABS
850.0000 mg | ORAL_TABLET | Freq: Two times a day (BID) | ORAL | Status: DC
  Administered 2015-01-14: 850 mg via ORAL
  Filled 2015-01-14 (×3): qty 1

## 2015-01-14 MED ORDER — INSULIN NPH ISOPHANE & REGULAR (70-30) 100 UNIT/ML ~~LOC~~ SUSP: 16.0000 [IU] | Freq: Two times a day (BID) | SUBCUTANEOUS | Status: DC

## 2015-01-14 MED ORDER — METOPROLOL TARTRATE 100 MG PO TABS: 100.0000 mg | ORAL_TABLET | Freq: Two times a day (BID) | ORAL | Status: DC

## 2015-01-14 MED ORDER — "PEN NEEDLES 3/16"" 31G X 5 MM MISC": 1.0000 | Freq: Two times a day (BID) | Status: DC

## 2015-01-14 MED ORDER — LISINOPRIL 20 MG PO TABS: 20.0000 mg | ORAL_TABLET | Freq: Every day | ORAL | Status: DC

## 2015-01-14 MED ORDER — ASPIRIN 81 MG PO TBEC: 81.0000 mg | DELAYED_RELEASE_TABLET | Freq: Every day | ORAL | Status: DC

## 2015-01-14 MED ORDER — INSULIN ASPART PROT & ASPART (70-30 MIX) 100 UNIT/ML ~~LOC~~ SUSP: 16.0000 [IU] | Freq: Two times a day (BID) | SUBCUTANEOUS | Status: DC

## 2015-01-14 MED ORDER — INSULIN ASPART PROT & ASPART (70-30 MIX) 100 UNIT/ML ~~LOC~~ SUSP
16.0000 [IU] | Freq: Two times a day (BID) | SUBCUTANEOUS | Status: DC
Start: 2015-01-14 — End: 2015-01-14
  Administered 2015-01-14: 16 [IU] via SUBCUTANEOUS
  Filled 2015-01-14: qty 10

## 2015-01-14 MED ORDER — CIPROFLOXACIN HCL 500 MG PO TABS: 500.0000 mg | ORAL_TABLET | Freq: Two times a day (BID) | ORAL | Status: DC

## 2015-01-14 NOTE — Care Management Note (Signed)
    Page 1 of 1   01/14/2015     6:04:30 PM CARE MANAGEMENT NOTE 01/14/2015  Patient:  Samantha [name],Samantha [name]   Account Number:  [id number]  Date Initiated:  01/14/2015  Documentation initiated by:  Letha CapeAYLOR,Eternity Dexter  Subjective/Objective Assessment:   dx uti  admit- lives with family.     Action/Plan:   Anticipated DC Date:  01/14/2015   Anticipated DC Plan:  HOME/SELF CARE      DC Planning Services  CM consult  [facility]  Medication Assistance      Choice offered to / List presented to:             Status of service:  Completed, signed off Medicare Important Message given?  NO (If response is "NO", the following Medicare IM given date fields will be blank) Date Medicare IM given:   Medicare IM given by:   Date Additional Medicare IM given:   Additional Medicare IM given by:    Discharge Disposition:  HOME/SELF CARE  Per UR Regulation:  Reviewed for med. necessity/level of care/duration of stay  If discussed at Long Length of Stay Meetings, dates discussed:    Comments:  01/14/15 1803 Letha Capeeborah Chaise Passarella RN, BSN [phone] patient for dc today, [facility] at discount rate.

## 2015-01-14 NOTE — Discharge Summary (Signed)
Physician Discharge Summary  Samantha Samantha [name] GNF:[id number] DOB: 1951/03/28 DOA: 01/10/2015  PCP: Lavell Islam, MD  Will follow up at [facility] on 1/19.  Admit date: 01/10/2015 Discharge date: 01/14/2015  Time spent: 30 minutes  Recommendations for Outpatient Follow-up:  E-coli Bacteremia / pyelo.  Please check bmet / cbc in outpatient. Uncontrolled DM.  Started on Insulin.  Needs ongoing diabetes management BP elevated.  Started on Lisinopril. Diabetic ulcer on 4 left toe.  Please monitor.   Discharge Diagnoses:  Principal Problem:   SIRS (systemic inflammatory response syndrome) Active Problems:   Bacteremia, escherichia coli   Pyelonephritis   Diabetic toe ulcer   Constipation   Uncontrolled diabetes mellitus   Protein-calorie malnutrition, severe   Essential hypertension   Discharge Condition:  Stable.  History of present illness:  64 year old Hispanic Samantha [name] who moved from [place] 2 months back with history of hypertension, diabetes mellitus and history of UTI presented with urinary frequency with towel smell, polydipsia subjective fevers with vomiting and blurred vision. Patient was septic in the ED with fever of 102.4 Fahrenheit, white count of 16.4 K and tachycardic. Blood glucose was greater than 400. Admitted for sepsis likely secondary to UTI with pyelonephritis.  Hospital Course:  Sepsis secondary to Ecoli Bacteremia / UTI / pyelonephritis Low grade fever this am (99.6).  Patient anxious to go home.  One of 2 blood cultures shows Escherichia coli. Urine culture shows Escherichia coli, sensitive to Rocephin & Cipro. Patient has received 4 doses of IV Rocephin. Will discharge on oral cipro - for a total 14 day antibiotic course.  Uncontrolled Diabetes Mellitus with CBG 400+ and glucosuria Patient's CBG was quickly controlled with Lantus and NovoLog. Hgb A1C is 10.1.The patient has received diabetic education and counsel from the diabetic  coordinator and nursing staff. Thus far she has had difficulty giving herself insulin due to tremor, however, she is surrounded by family 24 hours a day who have offered to give her insulin to her. Will discharge on Amaryl 4 mg, metformin 850 mg twice a day, novolin N insulin 16 units twice a day. Her insulin dosing will need to be titrated.  Elevated troponin Peak of 0.06 and trended down 0.05>>0.04. No chest pain symptoms or EKG changes. Seems to be demand ischemia secondary to sepsis. 2-D echo shows grade 1 diastolic dysfunction with preserved left ventricular ejection fraction. No mention of vegetations.  Hypertension Patient's bp rose as her illness resolved.  She has been started on Lisinopril this admission.  Please monitor BP and renal function outpatient.  Constipation Patient reported no bowel movement 5 days on admission. Resolved with stool softeners and milk of magnesia.  Will recommend patient continue stool softeners after d/c.  URI Patient with cough, mild sore throat, left ear pain. Treated supportively.  Reports feeling better.  Diabetic ulcer of left fourth toe Appreciate wound care consultation. Will need to closely monitor outpatient.   Procedures: 2D Echo Study Conclusions - Left ventricle: The cavity size was normal. Wall thickness was normal. Systolic function was normal. The estimated ejection fraction was in the range of 60% to 65%. Wall motion was normal; there were no regional wall motion abnormalities. Doppler parameters are consistent with abnormal left ventricularrelaxation (grade 1 diastolic dysfunction).  Consultations:  none  Discharge Exam: Filed Vitals:   01/13/15 0605 01/13/15 1318 01/13/15 2228 01/14/15 0639  BP: 171/64 172/60 141/62 145/70  Pulse: 68 Samantha  64  Temp: 100.4 F (38 C) 99.6 F (37.6 C) 99  F (37.2 C) 99.6 F (37.6 C)  TempSrc: Oral Oral Oral Oral  Resp: 18 20 20 18   Height:      Weight:      SpO2: 98% 100% 100% 98%    Filed Weights   01/10/15 1029  Weight: 58.06 kg (128 lb)     General: Wd, Wn, Spanish speaking Samantha [name], in NAD, Sitting up in bed. Cardiovascular: RRR, no m/r/g Respiratory: CTA, no W/C/R Abdominal:soft, nt, nd, +bs, no masses Extremities: 5/5 strength in each.  Able to ambulate.  No swelling.  Discharge Instructions  Diet recommendation:    Current Discharge Medication List    START taking these medications   Details  aspirin EC 81 MG EC tablet Take 1 tablet (81 mg total) by mouth daily.    ciprofloxacin (CIPRO) 500 MG tablet Take 1 tablet (500 mg total) by mouth 2 (two) times daily. Qty: 20 tablet, Refills: 0    docusate sodium 100 MG CAPS Take 100 mg by mouth daily. Qty: 10 capsule, Refills: 0    insulin NPH-regular Human (NOVOLIN 70/30) (70-30) 100 UNIT/ML injection Inject 16 Units into the skin 2 (two) times daily with a meal. Qty: 10 mL, Refills: 11    Insulin Pen Needle (PEN NEEDLES 3/16") 31G X 5 MM MISC 1 Stick by Does not apply route 2 (two) times daily at 8 am and 10 pm. Qty: 100 each, Refills: 12    lisinopril (PRINIVIL,ZESTRIL) 20 MG tablet Take 1 tablet (20 mg total) by mouth daily. Qty: 30 tablet, Refills: 3      CONTINUE these medications which have CHANGED   Details  glimepiride (AMARYL) 4 MG tablet Take 1 tablet (4 mg total) by mouth daily with breakfast. Qty: 30 tablet, Refills: 3    metFORMIN (GLUCOPHAGE) 850 MG tablet Take 1 tablet (850 mg total) by mouth 2 (two) times daily with a meal. Qty: 60 tablet, Refills: 3    metoprolol (LOPRESSOR) 100 MG tablet Take 1 tablet (100 mg total) by mouth 2 (two) times daily. Qty: 60 tablet, Refills: 3      CONTINUE these medications which have NOT CHANGED   Details  ibuprofen (ADVIL,MOTRIN) 200 MG tablet Take 400 mg by mouth every 6 (six) hours as needed (pain/fever).       No Known Allergies Follow-up Information    Follow up with [facility] On 01/15/2015.   Why:  9  am for hospital follow up   Contact information:   [address] [place] [id number] [phone]       The results of significant diagnostics from this hospitalization (including imaging, microbiology, ancillary and laboratory) are listed below for reference.    Significant Diagnostic Studies: Dg Chest Port 1 View  01/10/2015   CLINICAL DATA:  Fever.  Sepsis.  EXAM: PORTABLE CHEST - 1 VIEW  COMPARISON:  None.  FINDINGS: Heart size and pulmonary vascularity are normal. Lungs are clear. No osseous abnormality. The patient has taken a shallow inspiration.  IMPRESSION: Normal exam.   Electronically Signed   By: Geanie CooleyJim  Maxwell M.D.   On: 01/10/2015 11:17   Dg Abd Portable 1v  01/11/2015   CLINICAL DATA:  Constipation, vomiting.  EXAM: PORTABLE ABDOMEN - 1 VIEW  COMPARISON:  None.  FINDINGS: The bowel gas pattern is normal. No radio-opaque calculi or other significant radiographic abnormality are seen.  IMPRESSION: No evidence of bowel obstruction or ileus.   Electronically Signed   By: Roque LiasJames  Green  M.D.   On: 01/11/2015 08:15   Dg Foot Complete Left  01/10/2015   CLINICAL DATA:  Sepsis, fever, infected fourth toe  EXAM: LEFT FOOT - COMPLETE 3+ VIEW  COMPARISON:  None.  FINDINGS: Three views of the left foot submitted. No acute fracture or subluxation. Small plantar spur of calcaneus. Mild soft tissue swelling fourth toe. No definite bone destruction to suggest osteomyelitis.  IMPRESSION: No acute fracture or subluxation. Mild soft tissue swelling fourth toe. No definite bone destruction to suggest osteomyelitis. Small plantar spur of calcaneus.   Electronically Signed   By: Natasha Mead M.D.   On: 01/10/2015 11:32    Microbiology: Recent Results (from the past 240 hour(s))  Blood Culture (routine x 2)     Status: None (Preliminary result)   Collection Time: 01/10/15 11:00 AM  Result Value Ref Range Status   Specimen Description BLOOD RIGHT ANTECUBITAL  Final   Special  Requests BOTTLES DRAWN AEROBIC AND ANAEROBIC  Final   Culture   Final           BLOOD CULTURE RECEIVED NO GROWTH TO DATE CULTURE WILL BE HELD FOR 5 DAYS BEFORE ISSUING A FINAL NEGATIVE REPORT Performed at [facility]    Report Status PENDING  Incomplete  Blood Culture (routine x 2)     Status: None   Collection Time: 01/10/15 11:15 AM  Result Value Ref Range Status   Specimen Description BLOOD ARM LEFT  Final   Special Requests BOTTLES DRAWN AEROBIC AND ANAEROBIC 2CC  Final   Culture   Final    ESCHERICHIA COLI Note: CRITICAL RESULT CALLED TO, READ BACK BY AND VERIFIED WITH: THELMA J RN  VINCJ Performed at [facility]    Report Status 01/14/2015 FINAL  Final   Organism ID, Bacteria ESCHERICHIA COLI  Final      Susceptibility   Escherichia coli - MIC*    AMPICILLIN >=32 RESISTANT Resistant     AMPICILLIN/SULBACTAM >=32 RESISTANT Resistant     CEFAZOLIN <=4 SENSITIVE Sensitive     CEFEPIME <=1 SENSITIVE Sensitive     CEFTAZIDIME <=1 SENSITIVE Sensitive     CEFTRIAXONE <=1 SENSITIVE Sensitive     CIPROFLOXACIN <=0.25 SENSITIVE Sensitive     GENTAMICIN >=16 RESISTANT Resistant     IMIPENEM <=0.25 SENSITIVE Sensitive     PIP/TAZO <=4 SENSITIVE Sensitive     TOBRAMYCIN 8 INTERMEDIATE Intermediate     TRIMETH/SULFA >=320 RESISTANT Resistant     * ESCHERICHIA COLI  Urine culture     Status: None   Collection Time: 01/10/15  1:15 PM  Result Value Ref Range Status   Specimen Description URINE, CLEAN CATCH  Final   Special Requests NONE  Final   Colony Count   Final    >=100,000 COLONIES/ML Performed at [facility]    Culture   Final    ESCHERICHIA COLI Performed at [facility]    Report Status 01/12/2015 FINAL  Final   Organism ID, Bacteria ESCHERICHIA COLI  Final      Susceptibility   Escherichia coli - MIC*    AMPICILLIN >=32 RESISTANT Resistant     CEFAZOLIN <=4 SENSITIVE Sensitive     CEFTRIAXONE <=1 SENSITIVE Sensitive      CIPROFLOXACIN <=0.25 SENSITIVE Sensitive     GENTAMICIN >=16 RESISTANT Resistant     LEVOFLOXACIN 0.5 SENSITIVE Sensitive     NITROFURANTOIN <=16 SENSITIVE Sensitive     TOBRAMYCIN 4 SENSITIVE Sensitive     TRIMETH/SULFA >=  320 RESISTANT Resistant     PIP/TAZO <=4 SENSITIVE Sensitive     * ESCHERICHIA COLI     Labs: Basic Metabolic Panel:  Recent Labs Lab 01/10/15 1104 01/10/15 1911 01/11/15 0635  NA 132*  --  138  K 4.1  --  3.8  CL 101  --  104  CO2 19  --  23  GLUCOSE 440*  --  131*  BUN 13  --  12  CREATININE 1.13* 1.09 0.97  CALCIUM 9.0  --  8.7   Liver Function Tests:  Recent Labs Lab 01/10/15 1104 01/11/15 0635  AST 22 30  ALT 20 19  ALKPHOS 98 75  BILITOT 0.8 0.4  PROT 7.6 7.3  ALBUMIN 3.6 3.3*   CBC:  Recent Labs Lab 01/10/15 1104 01/11/15 0635 01/12/15 0900  WBC 16.4* 11.8* 5.9  NEUTROABS 14.4*  --   --   HGB 12.9 12.4 10.9*  HCT 35.7* 35.4* 31.4*  MCV 88.6 89.6 88.0  PLT 353 340 307   Cardiac Enzymes:  Recent Labs Lab 01/10/15 1911 01/11/15 0855 01/11/15 1314 01/11/15 2031  TROPONINI 0.06* 0.05* 0.04* 0.04*   CBG:  Recent Labs Lab 01/13/15 1204 01/13/15 1701 01/13/15 2226 01/14/15 0810 01/14/15 1203  GLUCAP 295* 210* 254* 179* 356*       Signed:  Stephani Police, PA-C  [facility] 01/14/2015, 3:10 PM

## 2015-01-15 ENCOUNTER — Ambulatory Visit: Payer: Self-pay | Attending: Internal Medicine

## 2015-01-15 ENCOUNTER — Encounter: Payer: Self-pay | Admitting: Internal Medicine

## 2015-01-15 ENCOUNTER — Ambulatory Visit: Payer: Self-pay | Attending: Physician Assistant | Admitting: Internal Medicine

## 2015-01-15 VITALS — BP 161/91 | HR 79 | Temp 97.7°F | Resp 16 | Ht 63.0 in | Wt 138.0 lb

## 2015-01-15 DIAGNOSIS — I10 Essential (primary) hypertension: Secondary | ICD-10-CM | POA: Insufficient documentation

## 2015-01-15 DIAGNOSIS — E119 Type 2 diabetes mellitus without complications: Secondary | ICD-10-CM | POA: Insufficient documentation

## 2015-01-15 LAB — CBC WITH DIFFERENTIAL/PLATELET
Basophils Absolute: 0.1 10*3/uL (ref 0.0–0.1)
Basophils Relative: 1 % (ref 0–1)
EOS PCT: 1 % (ref 0–5)
Eosinophils Absolute: 0.1 10*3/uL (ref 0.0–0.7)
HEMATOCRIT: 36.8 % (ref 36.0–46.0)
Hemoglobin: 12.6 g/dL (ref 12.0–15.0)
LYMPHS PCT: 22 % (ref 12–46)
Lymphs Abs: 1.7 10*3/uL (ref 0.7–4.0)
MCH: 30.9 pg (ref 26.0–34.0)
MCHC: 34.2 g/dL (ref 30.0–36.0)
MCV: 90.2 fL (ref 78.0–100.0)
MONO ABS: 0.6 10*3/uL (ref 0.1–1.0)
MONOS PCT: 8 % (ref 3–12)
MPV: 9.6 fL (ref 8.6–12.4)
NEUTROS ABS: 5.4 10*3/uL (ref 1.7–7.7)
Neutrophils Relative %: 68 % (ref 43–77)
Platelets: 473 10*3/uL — ABNORMAL HIGH (ref 150–400)
RBC: 4.08 MIL/uL (ref 3.87–5.11)
RDW: 13.9 % (ref 11.5–15.5)
WBC: 7.9 10*3/uL (ref 4.0–10.5)

## 2015-01-15 LAB — COMPLETE METABOLIC PANEL WITH GFR
ALT: 33 U/L (ref 0–35)
AST: 48 U/L — AB (ref 0–37)
Albumin: 4 g/dL (ref 3.5–5.2)
Alkaline Phosphatase: 84 U/L (ref 39–117)
BUN: 13 mg/dL (ref 6–23)
CALCIUM: 10 mg/dL (ref 8.4–10.5)
CHLORIDE: 101 meq/L (ref 96–112)
CO2: 24 mEq/L (ref 19–32)
CREATININE: 1.03 mg/dL (ref 0.50–1.10)
GFR, EST AFRICAN AMERICAN: 68 mL/min
GFR, EST NON AFRICAN AMERICAN: 59 mL/min — AB
GLUCOSE: 119 mg/dL — AB (ref 70–99)
Potassium: 4.7 mEq/L (ref 3.5–5.3)
Sodium: 138 mEq/L (ref 135–145)
Total Bilirubin: 0.3 mg/dL (ref 0.2–1.2)
Total Protein: 8 g/dL (ref 6.0–8.3)

## 2015-01-15 LAB — LIPID PANEL
CHOLESTEROL: 179 mg/dL (ref 0–200)
HDL: 35 mg/dL — ABNORMAL LOW (ref >39)
LDL Cholesterol: 94 mg/dL (ref 0–99)
Total CHOL/HDL Ratio: 5.1 Ratio
Triglycerides: 251 mg/dL — ABNORMAL HIGH (ref <150)
VLDL: 50 mg/dL — ABNORMAL HIGH (ref 0–40)

## 2015-01-15 LAB — TSH: TSH: 1.452 u[IU]/mL (ref 0.350–4.500)

## 2015-01-15 LAB — GLUCOSE, POCT (MANUAL RESULT ENTRY): POC Glucose: 169 mg/dl — AB (ref 70–99)

## 2015-01-15 MED ORDER — LISINOPRIL 20 MG PO TABS: 20.0000 mg | ORAL_TABLET | Freq: Every day | ORAL | Status: DC

## 2015-01-15 MED ORDER — GLIMEPIRIDE 4 MG PO TABS
4.0000 mg | ORAL_TABLET | Freq: Every day | ORAL | Status: DC
Start: 2015-01-15 — End: 2015-04-04

## 2015-01-15 MED ORDER — METOPROLOL TARTRATE 100 MG PO TABS: 100.0000 mg | ORAL_TABLET | Freq: Two times a day (BID) | ORAL | Status: DC

## 2015-01-15 MED ORDER — INSULIN NPH ISOPHANE & REGULAR (70-30) 100 UNIT/ML ~~LOC~~ SUSP
16.0000 [IU] | Freq: Two times a day (BID) | SUBCUTANEOUS | Status: DC
Start: 2015-01-15 — End: 2015-01-29

## 2015-01-15 MED ORDER — METFORMIN HCL 1000 MG PO TABS: 1000.0000 mg | ORAL_TABLET | Freq: Two times a day (BID) | ORAL | Status: DC

## 2015-01-15 NOTE — Progress Notes (Signed)
Patient ID: Samantha [name], female   DOB: 1951/08/23, 64 y.o.   MRN: [id number]   Samantha [name], is a 64 y.o. female  [id number]  [id number]  DOB - 1951/08/23  CC:  Chief Complaint  Patient presents with  . Hospitalization Follow-up  . Establish Care       HPI: Samantha [name] is a 64 y.o. female here today to establish medical care. Patient has a history of hypertension and diabetes mellitus, recently relocated from [place] about 2 [place] ago, admitted to the hospital on 01/10/2015 with pyelonephritis and hyperglycemia, was managed with IV antibiotics and insulin, discharged on ciprofloxacin and medications for diabetes and hypertension. She is here today for hospital discharge follow-up. She has no new complaints. Blood pressure today is slightly elevated, patient has not taken her medications today, blood sugar today is 169 fasting. Patient claims to be compliant with medications, she reports no specific side effects. She is not sure if she has had colonoscopy. She had a hysterectomy. No record of mammographic. She also has a nonhealing ulcer on her left third toe which is slightly swollen but no redness, no pain, no discharge, no foul smell. Patient has no fever, no blurry vision, no pain in the extremities. Patient has No headache, No chest pain, No abdominal pain - No Nausea, No new weakness tingling or numbness, No Cough - SOB.  No Known Allergies Past Medical History  Diagnosis Date  . Hypertension   . Diabetes mellitus without complication    Current Outpatient Prescriptions on File Prior to Visit  Medication Sig Dispense Refill  . ciprofloxacin (CIPRO) 500 MG tablet Take 1 tablet (500 mg total) by mouth 2 (two) times daily. 20 tablet 0  . Insulin Pen Needle (PEN NEEDLES 3/16") 31G X 5 MM MISC 1 Stick by Does not apply route 2 (two) times daily at 8 am and 10 pm. 100 each 12  . aspirin EC 81 MG EC tablet Take 1 tablet (81 mg total) by mouth daily. (Patient not  taking: Reported on 01/15/2015)    . docusate sodium 100 MG CAPS Take 100 mg by mouth daily. (Patient not taking: Reported on 01/15/2015) 10 capsule 0  . ibuprofen (ADVIL,MOTRIN) 200 MG tablet Take 400 mg by mouth every 6 (six) hours as needed (pain/fever).     No current facility-administered medications on file prior to visit.   History reviewed. No pertinent family history. History   Social History  . Marital Status: Single    Spouse Name: N/A    Number of Children: N/A  . Years of Education: N/A   Occupational History  . Not on file.   Social History Main Topics  . Smoking status: Never Smoker   . Smokeless tobacco: Not on file  . Alcohol Use: No  . Drug Use: No  . Sexual Activity: Not on file   Other Topics Concern  . Not on file   Social History Narrative    Review of Systems: Constitutional: Negative for fever, chills, diaphoresis, activity change, appetite change and fatigue. HENT: Negative for ear pain, nosebleeds, congestion, facial swelling, rhinorrhea, neck pain, neck stiffness and ear discharge.  Eyes: Negative for pain, discharge, redness, itching and visual disturbance. Respiratory: Negative for cough, choking, chest tightness, shortness of breath, wheezing and stridor.  Cardiovascular: Negative for chest pain, palpitations and leg swelling. Gastrointestinal: Negative for abdominal distention. Genitourinary: Negative for dysuria, urgency, frequency, hematuria, flank pain, decreased urine volume, difficulty urinating and dyspareunia.  Musculoskeletal: Negative for back pain, joint  swelling, arthralgia and gait problem. Neurological: Negative for dizziness, tremors, seizures, syncope, facial asymmetry, speech difficulty, weakness, light-headedness, numbness and headaches.  Hematological: Negative for adenopathy. Does not bruise/bleed easily. Psychiatric/Behavioral: Negative for hallucinations, behavioral problems, confusion, dysphoric mood, decreased concentration  and agitation.    Objective:   Filed Vitals:   01/15/15 0952  BP: 161/91  Pulse: 79  Temp: 97.7 F (36.5 C)  Resp: 16    Physical Exam: Constitutional: Patient appears well-developed and well-nourished. No distress. HENT: Normocephalic, atraumatic, External right and left ear normal. Oropharynx is clear and moist.  Eyes: Conjunctivae and EOM are normal. PERRLA, no scleral icterus. Neck: Normal ROM. Neck supple. No JVD. No tracheal deviation. No thyromegaly. CVS: RRR, S1/S2 +, no murmurs, no gallops, no carotid bruit.  Pulmonary: Effort and breath sounds normal, no stridor, rhonchi, wheezes, rales.  Abdominal: Soft. BS +, no distension, tenderness, rebound or guarding.  Musculoskeletal: Normal range of motion. No edema and no tenderness. 3rd left toe is boggy with this, but on the plantar surface, no active bleeding or pus discharge, no tenderness, no erythema.  Lymphadenopathy: No lymphadenopathy noted, cervical, inguinal or axillary Neuro: Alert. Normal reflexes, muscle tone coordination. No cranial nerve deficit. Skin: Skin is warm and dry. No rash noted. Not diaphoretic. No erythema. No pallor. Psychiatric: Normal mood and affect. Behavior, judgment, thought content normal.  Lab Results  Component Value Date   WBC 5.9 01/12/2015   HGB 10.9* 01/12/2015   HCT 31.4* 01/12/2015   MCV 88.0 01/12/2015   PLT 307 01/12/2015   Lab Results  Component Value Date   CREATININE 0.97 01/11/2015   BUN 12 01/11/2015   NA 138 01/11/2015   K 3.8 01/11/2015   CL 104 01/11/2015   CO2 23 01/11/2015    Lab Results  Component Value Date   HGBA1C 10.1* 01/10/2015   Lipid Panel  No results found for: CHOL, TRIG, HDL, CHOLHDL, VLDL, LDLCALC     Assessment and plan:   1. Type 2 diabetes mellitus without complication  - Glucose (CBG) Increase metformin to 1000 mg tablet by mouth twice a day from 850 and continue other regimen Patient has been given a log book to keep blood sugar log  and bring to clinic next visit  - metFORMIN (GLUCOPHAGE) 1000 MG tablet; Take 1 tablet (1,000 mg total) by mouth 2 (two) times daily with a meal.  Dispense: 180 tablet; Refill: 3  - glimepiride (AMARYL) 4 MG tablet; Take 1 tablet (4 mg total) by mouth daily with breakfast.  Dispense: 90 tablet; Refill: 3  - insulin NPH-regular Human (NOVOLIN 70/30) (70-30) 100 UNIT/ML injection; Inject 16 Units into the skin 2 (two) times daily with a meal.  Dispense: 3 vial; Refill: 3  - Ambulatory referral to Podiatry for possible debridement  2. Essential hypertension  - metoprolol (LOPRESSOR) 100 MG tablet; Take 1 tablet (100 mg total) by mouth 2 (two) times daily.  Dispense: 180 tablet; Refill: 3 - lisinopril (PRINIVIL,ZESTRIL) 20 MG tablet; Take 1 tablet (20 mg total) by mouth daily.  Dispense: 90 tablet; Refill: 3 - CBC with Differential - COMPLETE METABOLIC PANEL WITH GFR - Lipid panel - TSH - Urinalysis, Complete - Vit D  25 hydroxy (rtn osteoporosis monitoring) - DASH Diet  Aim for 2-3 Carb Choices per meal (30-45 grams) +/- 1 either way  Aim for 0-15 Carbs per snack if hungry  Include protein in moderation with your meals and snacks  Consider reading food labels for Total Carbohydrate  and Fat Grams of foods  Consider checking BG at alternate times per day  Continue taking medication as directed Fruit Punch - find one with no sugar  Measure and decrease portions of carbohydrate foods  Make your plate and don't go back for seconds  Patient was counseled extensively about nutrition and exercise. Interpreter was used to communicate directly with patient for the entire encounter including providing detailed patient instructions.   Return in about 4 weeks (around 02/12/2015), or if symptoms worsen or fail to improve, for Follow up HTN, DM2, medication management.  The patient was given clear instructions to go to ER or return to medical center if symptoms don't improve, worsen or new problems  develop. The patient verbalized understanding. The patient was told to call to get lab results if they haven't heard anything in the next week.     This note has been created with Education officer, environmental. Any transcriptional errors are unintentional.    Jeanann Lewandowsky, MD, MHA, FACP, FAAP [facility] [place], [place] [phone]   01/15/2015, 10:30 AM

## 2015-01-15 NOTE — Progress Notes (Signed)
[facility] Pt is here to manage her diabetes. Pt has an ulcer on her third toe on her left foot.

## 2015-01-15 NOTE — Patient Instructions (Signed)
Diabetes y Kandace Blitz fsica (Diabetes and Exercise) Hacer actividad fsica con regularidad es muy importante. No se trata solo de [employer]. Tiene muchos otros beneficios, como por ejemplo:  Mejorar el estado fsico, la flexibilidad y la resistencia.  Aumenta la densidad sea.  Ayuda a Technical sales engineer.  Disminuye la Air traffic controller.  Aumenta la fuerza muscular.  Reduce el estrs y las tensiones.  Mejora el estado de salud general. Las personas diabticas que realizan actividad fsica tienen beneficios adicionales debido al ejercicio:  Reduce el apetito.  El organismo mejora el uso del azcar (glucosa) de la [place].  Ayuda a disminuir o Product/process development scientist.  Disminuye la presin arterial.  Ayuda a disminuir los lpidos en la sangre (colesterol y triglicridos).  El organismo mejora el uso de la insulina porque:  Aumenta la sensibilidad del organismo a la insulina.  Reduce las necesidades de insulina del organismo.  Disminuye el riesgo de enfermedad cardaca por la actividad fsica ya que  disminuye el colesterol y [employer] triglicridos.  Aumenta los niveles de colesterol bueno (como las lipoprotenas de alta densidad [HDL]) en el organismo.  Disminuye los niveles de glucosa en la [place]. SU PLAN DE ACTIVIDAD  Elija una actividad que disfrute y establezca objetivos realistas. Su mdico o educador en diabetes podrn ayudarlo a encontrar una actividad que lo beneficie. Haga ejercicio regularmente como se lo haya indicado el mdico. Esto incluye:  Hacer entrenamiento de [employer] a la semana, como flexiones, sentadillas, levantar peso o usar bandas de resistencia.  Practicar 158minutos de ejercicios cardiovasculares cada semana, como caminar, correr o hacer algn deporte.  Mantenerse activo y no permanecer inactivo durante ms de 19minutos seguidos. Los perodos cortos de [place] tambin son beneficiosos. Tres sesiones de 19minutos a  lo largo del da son tan beneficiosas como una sola sesin de 48minutos. Estas son algunas ideas para los ejercicios:  Lleve a Probation officer.  Utilice las Clinical cytogeneticist del ascensor.  Baile su cancin favorita.  Haga los ejercicios de un video de ejercicios.  Haga sus ejercicios favoritos con Gaffer. RECOMENDACIONES PARA REALIZAR EJERCICIOS CUANDO SE TIENE DIABETES TIPO 1 O TIPO 2   Controle la glucosa en la sangre antes de comenzar. Si el nivel de glucosa en la sangre es de ms de 240 mg/dl, controle las cetonas en la [place]. No haga actividad fsica si hay cetonas.  Evite inyectarse insulina en las zonas del cuerpo que ejercitar. Por ejemplo, evite inyectarse insulina en:  Los brazos, si juega al tenis.  Las piernas, si corre.  Lleve un registro de:  Los alimentos que consume antes y despus de TEFL teacher.  Los momentos esperables de picos de accin de la insulina.  Los niveles de glucosa en la sangre antes y despus de hacer ejercicios.  El tipo y cantidad de [place] fsica que Musician.  Revise los registros con su mdico. El mdico lo ayudar a Actor pautas para ajustar la cantidad de alimento y las cantidades de insulina antes y despus de Field seismologist ejercicios.  Si toma insulina o agentes hipoglucemiantes por va oral, observe si hay signos y sntomas de hipoglucemia. Entre los que se incluyen:  Mareos.  Temblores.  Sudoracin.  Escalofros.  Confusin.  Beba gran cantidad de agua mientras hace ejercicios para evitar la deshidratacin o los golpes de Freight forwarder. Durante la actividad fsica se pierde agua corporal que se debe reponer.  Comente con su mdico antes de comenzar un programa de  actividad fsica para verificar que sea seguro para usted. Recuerde, cualquier actividad es mejor que ninguna. Document Released: 01/03/2008 Document Revised: 04/30/2014 [facility] Patient Information 2015 [place], [place]. This information is not intended to  replace advice given to you by your health care provider. Make sure you discuss any questions you have with your health care provider. Recuento bsico de carbohidratos para la diabetes mellitus (Basic Carbohydrate Counting for Diabetes Mellitus) El recuento de carbohidratos es un mtodo destinado a calcular la cantidad de carbohidratos en la dieta. El consumo de carbohidratos aumenta naturalmente el nivel de azcar (glucosa) en la sangre, por lo que es importante que sepa la cantidad que debe incluir en cada comida. El recuento de carbohidratos ayuda a Advertising account executive de glucosa en la sangre dentro de los lmites normales. La cantidad permitida de carbohidratos es diferente para cada persona. Un nutricionista puede ayudarlo a calcular la cantidad adecuada para usted. Una vez que sepa la cantidad de carbohidratos que puede consumir, podr calcular los carbohidratos de los alimentos que desea comer. Los siguientes alimentos incluyen carbohidratos:  Granos, como panes y cereales.  Frijoles secos y productos con soja.  Vegetales almidonados, como papas, guisantes y maz.  Lambert Mody y jugos de frutas.  Leche y Estate agent.  Dulces y bocadillos, como pastel, galletas, caramelos, papas fritas de bolsa, refrescos y bebidas frutales con azcar. RECUENTO DE CARBOHIDRATOS [employer] de calcular los carbohidratos de los alimentos. Puede usar cualquiera de los dos mtodos o [place] combinacin de [place]. Leer la etiqueta de informacin nutricional de los alimentos envasados La informacin nutricional es una etiqueta incluida en casi todas las bebidas y los alimentos envasados de los [place]. Indica el tamao de la porcin de ese alimento o bebida e informacin sobre los nutrientes de cada porcin, incluso los gramos (g) de carbohidratos por porcin.  Decida la cantidad de porciones que comer o tomar de este alimento o bebida. Multiplique la cantidad de porciones por el nmero de gramos de carbohidratos  indicados en la etiqueta para esa porcin. El total ser la cantidad de carbohidratos que consumir al comer ese alimento o tomar esa bebida. Conocer las porciones estndar de los alimentos Cuando coma alimentos no envasados o que no incluyan la informacin nutricional en la etiqueta, deber medir las porciones para poder calcular la cantidad de carbohidratos. Una porcin de la mayora de los alimentos ricos en carbohidratos contiene alrededor de 15g de carbohidratos. La siguiente [employer] tamaos de porcin de los alimentos ricos en carbohidratos que contienen alrededor de 15g de carbohidratos por porcin:   1rebanada de pan (1oz) o 1tortilla de seis pulgadas.  panecillo de hamburguesa o bollito tipo ingls.  4a 6galletas.   de taza de cereal sin azcar y seco.   taza de cereal caliente.   de taza de arroz o pastas.  taza de pur de papas o de una papa grande al horno.  1taza de frutas frescas o una fruta pequea.  taza de frutas o jugo de frutas enlatados o congelados.  [place].   de taza de yogur descremado sin ningn agregado o de yogur endulzado con edulcorante artificial.  taza de vegetales almidonados, como guisantes, maz o papas, o de frijoles secos cocidos. Decida la cantidad de porciones Gaffer. Multiplique la cantidad de porciones por 15 (los gramos de carbohidratos en esa porcin). Por ejemplo, si come 2tazas de fresas, habr comido 2porciones y 30g de carbohidratos (2porciones x 15g = 30g). [place]  como sopas y guisos, en las que se mezcla ms de un alimento, deber [employer] carbohidratos de cada alimento incluido. EJEMPLO DE RECUENTO DE CARBOHIDRATOS Ejemplo de cena  3 onzas de pechugas de pollo.   de taza de arroz integral.   taza de maz.  1 taza de Gideon.  1 taza de fresas con crema batida sin azcar. Clculo de carbohidratos [place] 1: Identifique los alimentos que contienen carbohidratos:     Arroz.  Maz.  Leche.  Jinny Sanders. [place] 2: Calcule el nmero de porciones que consumir de cada uno:   2 porciones de Surveyor, minerals.  1 porcin de maz.  1 porcin de leche.  1 porcin de fresas. [place] 3: Multiplique cada una de esas porciones por 15g:   2 porciones de arroz x 15 g = 30 g.  1 porcin de maz x 15 g = 15 g.  1 porcin de leche x 15 g = 15 g.  1 porcin de fresas x 15 g = 15 g. [place] 4: Sume todas las cantidades para Artist total de gramos de carbohidratos consumidos: 30 g + 15 g + 15 g + 15 g = 75 g. Document Released: 03/07/2012 Document Revised: 04/30/2014 [facility] Patient Information 2015 [place], [place]. This information is not intended to replace advice given to you by your health care provider. Make sure you discuss any questions you have with your health care provider. Plan de alimentacin DASH (DASH Eating Plan) DASH es la sigla en ingls de "Enfoques Alimentarios para Detener la Hipertensin". El plan de alimentacin DASH ha demostrado bajar la presin arterial elevada (hipertensin). Los beneficios adicionales para la salud pueden incluir la disminucin del riesgo de diabetes mellitus tipo2, enfermedades cardacas e ictus. Este plan tambin puede ayudar a Geophysical data processor. QU DEBO SABER ACERCA DEL PLAN DE ALIMENTACIN DASH? Para el plan de alimentacin DASH, seguir las siguientes pautas generales:  Elija los alimentos con un valor porcentual diario de sodio de menos del 5% (segn figura en la etiqueta del alimento).  Use hierbas o aderezos sin sal, en lugar de sal de mesa o sal marina.  Consulte al mdico o farmacutico antes de usar sustitutos de la sal.  Coma productos con bajo contenido de sodio, cuya etiqueta suele decir "bajo contenido de sodio" o "sin agregado de sal".  Coma alimentos frescos.  Coma ms verduras, frutas y productos lcteos con bajo contenido de [place].  Elija los cereales integrales. Busque la palabra "integral" en Publishing rights manager de la lista de ingredientes.  Elija el pescado y el pollo o el pavo sin piel ms a menudo que las carnes rojas. Limite el consumo de pescado, carne de ave y carne a 6onzas (170g) por Futures trader.  Limite el consumo de dulces, postres, azcares y bebidas azucaradas.  Elija las grasas saludables para el corazn.  Limite el consumo de queso a 1onza (28g) por Futures trader.  Consuma ms comida casera y menos de restaurante, de buf y comida rpida.  Limite el consumo de alimentos fritos.  Cocine los alimentos utilizando mtodos que no sean la fritura.  Limite las verduras enlatadas. Si las consume, enjuguelas bien para disminuir el sodio.  Cuando coma en un restaurante, pida que preparen su comida con menos sal o, en lo posible, sin nada de sal. QU ALIMENTOS PUEDO COMER? Pida ayuda a un nutricionista para conocer las necesidades calricas individuales. Cereales Pan de salvado o integral. Arroz integral. Pastas de salvado o integrales. Quinua, trigo burgol y cereales integrales. Cereales con bajo  contenido de Center Pointsodio. Tortillas de harina de maz o de salvado. Pan de maz integral. Galletas saladas integrales. Galletas con bajo contenido de Yak[place]odio. Vegetales Verduras frescas o congeladas (crudas, al vapor, asadas o grilladas). Jugos de tomate y verduras con contenido bajo o reducido de sodio. Pasta y salsa de tomate con contenido bajo o reducido de sodio. Verduras enlatadas con bajo contenido de sodio o reducido de sodio.  Nils PyleFrutas Nils PyleFrutas frescas, en conserva (en su jugo natural) o frutas congeladas. Carnes y otros productos con protenas Carne de res molida (al 85% o ms [place] Marinomagra), carne de res de animales alimentados con pastos o carne de res sin la grasa. Pollo o pavo sin piel. Carne de pollo o de St. Robertpavo molida. Cerdo sin la grasa. Todos los pescados y frutos de mar. Huevos. Porotos, guisantes o lentejas secos. Frutos secos y semillas sin sal. Frijoles enlatados sin sal. Lcteos Productos lcteos con  bajo contenido de grasas, como [place] descremada o al 1%, quesos reducidos en grasas o al 2%, ricota con bajo contenido de grasas o Leggett & Plattqueso cottage, o yogur natural con bajo contenido de Niotazegrasas. Quesos con contenido bajo o reducido de sodio. Grasas y Writeraceites Margarinas en barra que no contengan grasas trans. [place] y alios para ensaladas livianos o reducidos en grasas (reducidos en sodio). [place]. Aceites de crtamo, oliva o canola. Mantequilla natural de man o almendra. Otros Palomitas de maz y pretzels sin sal. Los artculos mencionados arriba pueden no ser Raytheonuna lista completa de las bebidas o los alimentos recomendados. Comunquese con el nutricionista para conocer ms opciones. QU ALIMENTOS NO SE RECOMIENDAN? Cereales Pan blanco. Pastas blancas. Arroz blanco. Pan de maz refinado. Bagels y croissants. Galletas saladas que contengan grasas trans. Vegetales Vegetales con crema o fritos. Verduras en salsa de Berlinqueso. Verduras enlatadas comunes. Pasta y salsa de tomate en lata comunes. Jugos comunes de tomate y de verduras. Nils PyleFrutas Frutas secas. Fruta enlatada en almbar liviano o espeso. Jugo de frutas. Carnes y otros productos con protenas [place] de carne con Holiday representativegrasa. Costillas, alas de pollo, tocineta, salchicha, mortadela, salame, chinchulines, tocino, perros calientes, salchichas alemanas y embutidos envasados. Frutos secos y semillas con sal. Frijoles con sal en lata. Lcteos Leche entera o al 2%, crema, mezcla de Petersburgleche y crema, y queso crema. Yogur entero o endulzado. Quesos o queso azul con alto contenido de Neurosurgeongrasas. [place]rem[place] no lcteas y coberturas batidas. Quesos procesados, quesos para untar o cuajadas. Condimentos Sal de cebolla y ajo, sal condimentada, sal de mesa y sal marina. Salsas en lata y envasadas. Salsa [place]. Salsa trtara. Salsa barbacoa. Salsa teriyaki. Salsa de soja, incluso la que tiene contenido reducido de [place] Cam[place]odio. Salsa de carne. Salsa de pescado. Salsa de  [place]ake Heritageostras. Salsa rosada. Rbano picante. Ketchup y mostaza. Saborizantes y tiernizantes para carne. Caldo en cubitos. Salsa picante. Salsa tabasco. Adobos. Aderezos para tacos. Salsas. Grasas y 24[address] West Mainaceites Mantequilla, Indiamargarina en barra, Honakermanteca de Lancastercerdo, Lordsburggrasa, Singaporemantequilla clarificada y Steffanie Rainwatergrasa de tocino. Aceites de coco, de palmiste o de palma. Aderezos comunes para ensalada. Otros Pickles y Kildeeraceitunas. Palomitas de maz y pretzels con sal. Los artculos mencionados arriba pueden no ser Raytheonuna lista completa de las bebidas y los alimentos que se Theatre stage managerdeben evitar. Comunquese con el nutricionista para obtener ms informacin. DNDE Raelyn MoraPUEDO ENCONTRAR MS INFORMACIN? Instituto Nacional del Sehiliorazn, del Pulmn y de la Sangre (National Heart, Lung, and Blood Institute): CablePromo.itwww.nhlbi.nih.gov/health/health-topics/topics/dash/ Document Released: 12/03/2011 Document Revised: 04/30/2014 [facility] Patient Information 2015 [place], [place]. This information is not intended to replace advice given to  you by your health care provider. Make sure you discuss any questions you have with your health care provider. Hipertensin (Hypertension) La hipertensin, conocida comnmente como presin arterial alta, se produce cuando la sangre bombea en las arterias con mucha fuerza. Las arterias son los vasos sanguneos que transportan la sangre desde el corazn hacia todas las partes del cuerpo. Una lectura de la presin arterial consiste en un nmero ms alto sobre un nmero ms bajo, por ejemplo, 110/72. El nmero ms alto (presin sistlica) corresponde a la presin interna de las arterias cuando el corazn [place]. El nmero ms bajo (presin diastlica) corresponde a la presin interna de las arterias cuando el corazn se relaja. En condiciones ideales, la presin arterial debe ser inferior a 120/80. La hipertensin fuerza al corazn a trabajar ms para Marine scientist. Las arterias pueden estrecharse o ponerse rgidas. La hipertensin  conlleva el riesgo de enfermedad cardaca, ictus y otros problemas.  FACTORES DE RIESGO Algunos factores de riesgo de hipertensin son controlables, pero otros no lo son.  Dynegy factores de riesgo que usted no puede Chief Operating Officer, se incluyen:   Nurse, learning disability. El riesgo es mayor para las Statistician.  La edad. Los riesgos aumentan con la edad.  El sexo. Antes de los 45aos, los hombres corren ms [employer]. Despus de los 65aos, las mujeres corren ms [employer]. Entre los factores de riesgo que usted puede Chief Operating Officer, se incluyen:  No hacer la cantidad suficiente de actividad fsica o ejercicio.  Tener sobrepeso.  Consumir mucha grasa, azcar, caloras o sal en la dieta.  Beber alcohol en exceso. SIGNOS Y SNTOMAS Por lo general, la hipertensin no causa signos o sntomas. La hipertensin demasiado alta (crisis hipertensiva) puede causar dolor de cabeza, ansiedad, falta de aire y hemorragia nasal. DIAGNSTICO  Para detectar si usted tiene hipertensin, el mdico le medir la presin arterial mientras est sentado, con el brazo levantado a la altura del corazn. Debe medirla al [facility] veces en el mismo brazo. Determinadas condiciones pueden causar una diferencia de presin arterial entre el brazo izquierdo y Aeronautical engineer. El hecho de tener una sola lectura de la presin arterial ms alta que lo normal no significa que Research scientist (physical sciences). En el caso de tener una lectura de la presin arterial con un valor alto, pdale al mdico que la verifique nuevamente. TRATAMIENTO  El tratamiento de la hipertensin arterial incluye hacer cambios en el estilo de vida y, posiblemente, tomar medicamentos. Un estilo de vida saludable puede ayudar a bajar la presin arterial alta. Quiz deba cambiar algunos hbitos. Los [employer]orporated en el estilo de vida pueden incluir:  Seguir la dieta DASH. Esta dieta tiene un alto contenido de frutas, verduras y Radiation protection practitioner.  Incluye poca cantidad de sal, carnes rojas y azcares agregados.  Hacer al menos 2horas de actividad fsica enrgica todas las semanas.  Perder peso, si es necesario.  No fumar.  Limitar el consumo de bebidas alcohlicas.  Aprender formas de reducir el estrs. Si los cambios en el estilo de vida no son suficientes para Museum/gallery curator la presin arterial, el mdico puede recetarle medicamentos. Quiz necesite tomar ms de uno. Trabaje en conjunto con su mdico para comprender los riesgos y los beneficios. INSTRUCCIONES PARA EL CUIDADO EN EL HOGAR  Haga que le midan de nuevo la presin arterial segn las indicaciones del mdico.  Tome los medicamentos solamente como se lo haya indicado el mdico. Siga cuidadosamente las indicaciones. Los medicamentos para  la presin arterial deben tomarse segn las indicaciones. Los medicamentos pierden eficacia al omitir las dosis. El hecho de omitir las dosis tambin [place] el riesgo de otros problemas.  No fume.  Contrlese la presin arterial en su casa segn las indicaciones del mdico. SOLICITE ATENCIN MDICA SI:   Piensa que tiene una reaccin alrgica a los medicamentos.  Tiene mareos o dolores de cabeza con Naval architect.  Tiene hinchazn en los tobillos.  Tiene problemas de visin. SOLICITE ATENCIN MDICA DE INMEDIATO SI:  Siente un dolor de cabeza intenso o confusin.  Siente debilidad inusual, adormecimiento o que Hospital doctor.  Siente dolor intenso en el pecho o en el abdomen.  Vomita repetidas veces.  Tiene dificultad para respirar. ASEGRESE DE QUE:   Comprende estas instrucciones.  Controlar su afeccin.  Recibir ayuda de inmediato si no mejora o si empeora. Document Released: 12/14/2005 Document Revised: 04/30/2014 [facility] Patient Information 2015 [place], [place]. This information is not intended to replace advice given to you by your health care provider. Make sure you discuss any questions you have with your  health care provider.

## 2015-01-16 LAB — URINALYSIS, COMPLETE
BACTERIA UA: NONE SEEN
BILIRUBIN URINE: NEGATIVE
CASTS: NONE SEEN
Crystals: NONE SEEN
Glucose, UA: NEGATIVE mg/dL
Hgb urine dipstick: NEGATIVE
KETONES UR: NEGATIVE mg/dL
Nitrite: NEGATIVE
Protein, ur: 30 mg/dL — AB
Squamous Epithelial / LPF: NONE SEEN
UROBILINOGEN UA: 0.2 mg/dL (ref 0.0–1.0)
pH: 6 (ref 5.0–8.0)

## 2015-01-16 LAB — CULTURE, BLOOD (ROUTINE X 2): Culture: NO GROWTH

## 2015-01-16 LAB — VITAMIN D 25 HYDROXY (VIT D DEFICIENCY, FRACTURES): VIT D 25 HYDROXY: 29 ng/mL — AB (ref 30–100)

## 2015-01-18 ENCOUNTER — Telehealth: Payer: Self-pay | Admitting: Emergency Medicine

## 2015-01-18 NOTE — Telephone Encounter (Signed)
-----   Message from Quentin Angstlugbemiga E Jegede, MD sent at 01/16/2015  5:37 PM EST ----- Please inform patient that her laboratory test results show possibility of urinary tract infection, and high triglyceride level, a form of cholesterol. The remaining test results are mostly within normal limits Plan is to continue the treatment for urinary tract infection as prescribed during her visit, and we advise low-cholesterol, low-fat diet and regular physical exercise as tolerated. We will check the cholesterol panel again in about 6 months, will advise patient to be compliant with her medications and dietary advice.

## 2015-01-18 NOTE — Telephone Encounter (Signed)
Pt given lab results with instructions to start low fat diet/exercise as tolerated. States she is taking prescribed medication for UTI Pacific interpretor language line used

## 2015-01-29 ENCOUNTER — Other Ambulatory Visit: Payer: Self-pay | Admitting: Internal Medicine

## 2015-01-29 DIAGNOSIS — E119 Type 2 diabetes mellitus without complications: Secondary | ICD-10-CM

## 2015-01-29 MED ORDER — INSULIN NPH ISOPHANE & REGULAR (70-30) 100 UNIT/ML ~~LOC~~ SUSP: 16.0000 [IU] | Freq: Two times a day (BID) | SUBCUTANEOUS | Status: DC

## 2015-01-31 ENCOUNTER — Ambulatory Visit: Payer: Self-pay | Attending: Internal Medicine

## 2015-01-31 ENCOUNTER — Other Ambulatory Visit: Payer: Self-pay | Admitting: Internal Medicine

## 2015-01-31 NOTE — Telephone Encounter (Signed)
Pt stated was taking two pills per day instead of 1 Advised to take one per day as Dr priscribed. Refills send to pharmacy

## 2015-01-31 NOTE — Telephone Encounter (Signed)
Patient has come in today to request a medication refill for glimepiride (AMARYL) 4 MG tablet; Patient was given script on 1/18 and is down to two tablets; please f/u with patient about instructions

## 2015-02-25 ENCOUNTER — Ambulatory Visit: Payer: Self-pay | Attending: Internal Medicine | Admitting: Internal Medicine

## 2015-02-25 ENCOUNTER — Encounter: Payer: Self-pay | Admitting: Internal Medicine

## 2015-02-25 VITALS — BP 163/81 | HR 65 | Temp 98.3°F | Resp 18 | Ht 62.0 in | Wt 141.0 lb

## 2015-02-25 DIAGNOSIS — E785 Hyperlipidemia, unspecified: Secondary | ICD-10-CM | POA: Insufficient documentation

## 2015-02-25 DIAGNOSIS — E119 Type 2 diabetes mellitus without complications: Secondary | ICD-10-CM

## 2015-02-25 DIAGNOSIS — I10 Essential (primary) hypertension: Secondary | ICD-10-CM | POA: Insufficient documentation

## 2015-02-25 DIAGNOSIS — L97529 Non-pressure chronic ulcer of other part of left foot with unspecified severity: Secondary | ICD-10-CM | POA: Insufficient documentation

## 2015-02-25 DIAGNOSIS — E1140 Type 2 diabetes mellitus with diabetic neuropathy, unspecified: Secondary | ICD-10-CM | POA: Insufficient documentation

## 2015-02-25 DIAGNOSIS — Z794 Long term (current) use of insulin: Secondary | ICD-10-CM | POA: Insufficient documentation

## 2015-02-25 DIAGNOSIS — F338 Other recurrent depressive disorders: Secondary | ICD-10-CM | POA: Insufficient documentation

## 2015-02-25 DIAGNOSIS — Z7982 Long term (current) use of aspirin: Secondary | ICD-10-CM | POA: Insufficient documentation

## 2015-02-25 DIAGNOSIS — E11621 Type 2 diabetes mellitus with foot ulcer: Secondary | ICD-10-CM | POA: Insufficient documentation

## 2015-02-25 DIAGNOSIS — F331 Major depressive disorder, recurrent, moderate: Secondary | ICD-10-CM | POA: Insufficient documentation

## 2015-02-25 LAB — POCT GLYCOSYLATED HEMOGLOBIN (HGB A1C): HEMOGLOBIN A1C: 7.3

## 2015-02-25 LAB — GLUCOSE, POCT (MANUAL RESULT ENTRY): POC Glucose: 208 mg/dl — AB (ref 70–99)

## 2015-02-25 MED ORDER — LISINOPRIL 20 MG PO TABS: 20.0000 mg | ORAL_TABLET | Freq: Every day | ORAL | Status: DC

## 2015-02-25 MED ORDER — LOSARTAN POTASSIUM 25 MG PO TABS: 25.0000 mg | ORAL_TABLET | Freq: Every day | ORAL | Status: DC

## 2015-02-25 MED ORDER — GABAPENTIN 100 MG PO CAPS: 100.0000 mg | ORAL_CAPSULE | Freq: Three times a day (TID) | ORAL | Status: DC

## 2015-02-25 MED ORDER — DULOXETINE HCL 30 MG PO CPEP: 30.0000 mg | ORAL_CAPSULE | Freq: Every day | ORAL | Status: DC

## 2015-02-25 NOTE — Progress Notes (Signed)
Pt evaluated for depression/anxiety screening

## 2015-02-25 NOTE — Progress Notes (Addendum)
Patient ID: Samantha [name], female   DOB: 20-Nov-1951, 64 y.o.   MRN: [id number]   Samantha [name], is a 64 y.o. female  [id number]  [id number]  DOB - 09/12/1951  Chief Complaint  Patient presents with  . Follow-up  . Hypertension  . Diabetes  . Foot Swelling    diabetic ulcer left foot 4th posterior digit         Subjective:   Samantha [name] is a 64 y.o. female here today for a follow up visit. Patient has history of hypertension, diabetes mellitus and hyperlipidemia recently relocated from [place] to the [place] currently on insulin for diabetes plan metoprolol for hypertension here today for routine follow-up. Patient was prescribed lisinopril during her last visit but she did not fill it has not been taking it , blood pressure remains uncontrolled. Blood sugar log reviewed in the clinic , fasting blood sugar ranges between 85 and 128 random blood sugar ranges between 110 and 230.  Patient is complaining of ongoing pain and tingling of the lower legs sometimes preventing her from ambulating. She has an ulcer on the plantar surface of left fourth toe , she has been on ciprofloxacin for about  6 weeks. No discharge , no foul smell , no redness , slightly swollen but not different from 2 months ago. Patient does not smoke cigarettes, she does not drink alcohol. Patient has No headache, No chest pain, No abdominal pain - No Nausea, No new weakness tingling or numbness, No Cough - SOB.  Problem  Type 2 Diabetes Mellitus Without Complication  Diabetic Ulcer of Left Foot Associated With Type 2 Diabetes Mellitus  Diabetic Neuropathy, Painful  Major Depressive Disorder, Recurrent Episode, Moderate    ALLERGIES: No Known Allergies  PAST MEDICAL HISTORY: Past Medical History  Diagnosis Date  . Hypertension   . Diabetes mellitus without complication     MEDICATIONS AT HOME: Prior to Admission medications   Medication Sig Start Date End Date Taking? Authorizing  Provider  aspirin EC 81 MG EC tablet Take 1 tablet (81 mg total) by mouth daily. 01/14/15  Yes Tora Kindred York, PA-C  insulin NPH-regular Human (NOVOLIN 70/30) (70-30) 100 UNIT/ML injection Inject 16 Units into the skin 2 (two) times daily with a meal. 01/29/15  Yes Quentin Angst, MD  metFORMIN (GLUCOPHAGE) 1000 MG tablet Take 1 tablet (1,000 mg total) by mouth 2 (two) times daily with a meal. 01/15/15  Yes Quentin Angst, MD  metoprolol (LOPRESSOR) 100 MG tablet Take 1 tablet (100 mg total) by mouth 2 (two) times daily. 01/15/15  Yes Quentin Angst, MD  ciprofloxacin (CIPRO) 500 MG tablet Take 1 tablet (500 mg total) by mouth 2 (two) times daily. Patient not taking: Reported on 02/25/2015 01/14/15   Stephani Police, PA-C  docusate sodium 100 MG CAPS Take 100 mg by mouth daily. Patient not taking: Reported on 01/15/2015 01/14/15   Stephani Police, PA-C  DULoxetine (CYMBALTA) 30 MG capsule Take 1 capsule (30 mg total) by mouth daily. 02/25/15   Quentin Angst, MD  gabapentin (NEURONTIN) 100 MG capsule Take 1 capsule (100 mg total) by mouth 3 (three) times daily. 02/25/15   Quentin Angst, MD  glimepiride (AMARYL) 4 MG tablet Take 1 tablet (4 mg total) by mouth daily with breakfast. Patient not taking: Reported on 02/25/2015 01/15/15   Quentin Angst, MD  ibuprofen (ADVIL,MOTRIN) 200 MG tablet Take 400 mg by mouth every 6 (six) hours as needed (pain/fever).  Historical Provider, MD  Insulin Pen Needle (PEN NEEDLES 3/16") 31G X 5 MM MISC 1 Stick by Does not apply route 2 (two) times daily at 8 am and 10 pm. 01/14/15   Tora KindredMarianne L York, PA-C  lisinopril (PRINIVIL,ZESTRIL) 20 MG tablet Take 1 tablet (20 mg total) by mouth daily. 02/25/15   Quentin Angstlugbemiga E Shauniece Kwan, MD     Objective:   Filed Vitals:   02/25/15 1028  BP: 163/81  Pulse: 65  Temp: 98.3 F (36.8 C)  TempSrc: Oral  Resp: 18  Height: 5\' 2"  (1.575 m)  Weight: 141 lb (63.957 kg)  SpO2: 99%    Exam General appearance  : Awake, alert, not in any distress. Speech Clear. Not toxic looking HEENT: Atraumatic and Normocephalic, pupils equally reactive to light and accomodation Neck: supple, no JVD. No cervical lymphadenopathy.  Chest:Good air entry bilaterally, no added sounds  CVS: S1 S2 regular, no murmurs.  Abdomen: Bowel sounds present, Non tender and not distended with no gaurding, rigidity or rebound. Extremities: B/L Lower Ext shows no edema, both legs are warm to touch Neurology: Awake alert, and oriented X 3, CN II-XII intact, Non focal Skin:No Rash Wounds:  Left Fourth Toe Sausage-shaped , tender, tiny dry nonhealing ulcer on the plantar surface, with callus.  Data Review Lab Results  Component Value Date   HGBA1C 7.30 02/25/2015   HGBA1C 10.1* 01/10/2015     Assessment & Plan   1. Type 2 diabetes mellitus without complication - HgB A1c - Glucose (CBG)  2. Essential hypertension  - Patient had cough reaction to Lisinopril - D/C - lisinopril (PRINIVIL,ZESTRIL) 20 MG tablet; Take 1 tablet (20 mg total) by mouth daily.  Dispense: 90 tablet; Refill: 3  - Prescribed Losartan 25 mg PO daily - DASH Diet  3. Diabetic ulcer of left foot associated with type 2 diabetes mellitus  - Ambulatory referral to Podiatry -  Triple Antibiotic ointment  4. Diabetic neuropathy, painful  - Ambulatory referral to Podiatry  - gabapentin (NEURONTIN) 100 MG capsule; Take 1 capsule (100 mg total) by mouth 3 (three) times daily.  Dispense: 270 capsule; Refill: 3  5. Major depressive disorder, recurrent episode, moderate  - DULoxetine (CYMBALTA) 30 MG capsule; Take 1 capsule (30 mg total) by mouth daily.  Dispense: 90 capsule; Refill: 3   Patient was counseled extensively about nutrition and exercise.  Interpreter was used to communicate directly with patient for the entire encounter including providing detailed patient instructions.    Return in about 2 months (around 04/26/2015), or if symptoms worsen  or fail to improve, for Hemoglobin A1C and Follow up, DM, Follow up HTN, Routine Follow Up.  The patient was given clear instructions to go to ER or return to medical center if symptoms don't improve, worsen or new problems develop. The patient verbalized understanding. The patient was told to call to get lab results if they haven't heard anything in the next week.   This note has been created with Education officer, environmentalDragon speech recognition software and smart phrase technology. Any transcriptional errors are unintentional.    Jeanann LewandowskyJEGEDE, Pyper Olexa, MD, MHA, FACP, FAAP [facility] [place], [place] [phone]   02/25/2015, 11:01 AM

## 2015-02-25 NOTE — Addendum Note (Signed)
Addended by: Nonnie DoneSMITH, JILL D on: 02/25/2015 11:19 AM   Modules accepted: Orders, Medications

## 2015-02-25 NOTE — Progress Notes (Signed)
Pt comes in today per 3 month f/u DM, Htn, diabetic foot ulcer with medical management Pt appears to have no understanding of medication administration  States she is only taking Insulin, Metformin and Metoprolol and states she was told to take antibiotic for 3 mnths for blood infection C/o sharp, shooting left foot 4th digit radiating to upper leg x 2 months Swelling noted with small opening, non healing  States she has to schedule appt with Podiatry  In house Spanish interpretor present  Health Maintenance Mammogram Colonoscopy

## 2015-02-25 NOTE — Addendum Note (Signed)
Addended by: Jeanann LewandowskyJEGEDE, Runell Kovich E on: 02/25/2015 11:23 AM   Modules accepted: Orders

## 2015-02-25 NOTE — Patient Instructions (Signed)
Recuento bsico de carbohidratos para la diabetes mellitus (Basic Carbohydrate Counting for Diabetes Mellitus) El recuento de carbohidratos es un mtodo destinado a calcular la cantidad de carbohidratos en la dieta. El consumo de carbohidratos aumenta naturalmente el nivel de azcar (glucosa) en la sangre, por lo que es importante que sepa la cantidad que debe incluir en cada comida. El recuento de carbohidratos ayuda a mantener el nivel de glucosa en la sangre dentro de los lmites normales. La cantidad permitida de carbohidratos es diferente para cada persona. Un nutricionista puede ayudarlo a calcular la cantidad adecuada para usted. Una vez que sepa la cantidad de carbohidratos que puede consumir, podr calcular los carbohidratos de los alimentos que desea comer. Los siguientes alimentos incluyen carbohidratos:  Granos, como panes y cereales.  Frijoles secos y productos con soja.  Vegetales almidonados, como papas, guisantes y maz.  Frutas y jugos de frutas.  Leche y yogur.  Dulces y bocadillos, como pastel, galletas, caramelos, papas fritas de bolsa, refrescos y bebidas frutales con azcar. RECUENTO DE CARBOHIDRATOS Hay dos maneras de calcular los carbohidratos de los alimentos. Puede usar cualquiera de los dos mtodos o una combinacin de ambos. Leer la etiqueta de informacin nutricional de los alimentos envasados La informacin nutricional es una etiqueta incluida en casi todas las bebidas y los alimentos envasados de los [place]. Indica el tamao de la porcin de ese alimento o bebida e informacin sobre los nutrientes de cada porcin, incluso los gramos (g) de carbohidratos por porcin.  Decida la cantidad de porciones que comer o tomar de este alimento o bebida. Multiplique la cantidad de porciones por el nmero de gramos de carbohidratos indicados en la etiqueta para esa porcin. El total ser la cantidad de carbohidratos que consumir al comer ese alimento o tomar esa  bebida. Conocer las porciones estndar de los alimentos Cuando coma alimentos no envasados o que no incluyan la informacin nutricional en la etiqueta, deber medir las porciones para poder calcular la cantidad de carbohidratos. Una porcin de la mayora de los alimentos ricos en carbohidratos contiene alrededor de 15g de carbohidratos. La siguiente lista incluye los tamaos de porcin de los alimentos ricos en carbohidratos que contienen alrededor de 15g de carbohidratos por porcin:   1rebanada de pan (1oz) o 1tortilla de seis pulgadas.  panecillo de hamburguesa o bollito tipo ingls.  4a 6galletas.   de taza de cereal sin azcar y seco.   taza de cereal caliente.   de taza de arroz o pastas.  taza de pur de papas o de una papa grande al horno.  1taza de frutas frescas o una fruta pequea.  taza de frutas o jugo de frutas enlatados o congelados.  1 taza de leche.   de taza de yogur descremado sin ningn agregado o de yogur endulzado con edulcorante artificial.  taza de vegetales almidonados, como guisantes, maz o papas, o de frijoles secos cocidos. Decida la cantidad de porciones estndar que comer. Multiplique la cantidad de porciones por 15 (los gramos de carbohidratos en esa porcin). Por ejemplo, si come 2tazas de fresas, habr comido 2porciones y 30g de carbohidratos (2porciones x 15g = 30g). Para las comidas como sopas y guisos, en las que se mezcla ms de un alimento, deber contar los carbohidratos de cada alimento incluido. EJEMPLO DE RECUENTO DE CARBOHIDRATOS Ejemplo de cena  3 onzas de pechugas de pollo.   de taza de arroz integral.   taza de maz.  1 taza de leche.  1 taza de fresas   con crema batida sin azcar. Clculo de carbohidratos Paso 1: Identifique los alimentos que contienen carbohidratos:   Arroz.  Maz.  Leche.  Fresas. Paso 2: Calcule el nmero de porciones que consumir de cada uno:   2 porciones de  arroz.  1 porcin de maz.  1 porcin de leche.  1 porcin de fresas. Paso 3: Multiplique cada una de esas porciones por 15g:   2 porciones de arroz x 15 g = 30 g.  1 porcin de maz x 15 g = 15 g.  1 porcin de leche x 15 g = 15 g.  1 porcin de fresas x 15 g = 15 g. Paso 4: Sume todas las cantidades para conocer el total de gramos de carbohidratos consumidos: 30 g + 15 g + 15 g + 15 g = 75 g. Document Released: 03/07/2012 Document Revised: 04/30/2014 ExitCare Patient Information 2015 ExitCare, LLC. This information is not intended to replace advice given to you by your health care provider. Make sure you discuss any questions you have with your health care provider. Diabetes y actividad fsica (Diabetes and Exercise) Hacer actividad fsica con regularidad es muy importante. No se trata solo de perder peso. Tiene muchos otros beneficios, como por ejemplo:  Mejorar el estado fsico, la flexibilidad y la resistencia.  Aumenta la densidad sea.  Ayuda a controlar el peso.  Disminuye la grasa corporal.  Aumenta la fuerza muscular.  Reduce el estrs y las tensiones.  Mejora el estado de salud general. Las personas diabticas que realizan actividad fsica tienen beneficios adicionales debido al ejercicio:  Reduce el apetito.  El organismo mejora el uso del azcar (glucosa) de la sangre.  Ayuda a disminuir o controlar la glucosa en la sangre.  Disminuye la presin arterial.  Ayuda a disminuir los lpidos en la sangre (colesterol y triglicridos).  El organismo mejora el uso de la insulina porque:  Aumenta la sensibilidad del organismo a la insulina.  Reduce las necesidades de insulina del organismo.  Disminuye el riesgo de enfermedad cardaca por la actividad fsica ya que  disminuye el colesterol y tambin los triglicridos.  Aumenta los niveles de colesterol bueno (como las lipoprotenas de alta densidad [HDL]) en el organismo.  Disminuye los niveles de  glucosa en la sangre. SU PLAN DE ACTIVIDAD  Elija una actividad que disfrute y establezca objetivos realistas. Su mdico o educador en diabetes podrn ayudarlo a encontrar una actividad que lo beneficie. Haga ejercicio regularmente como se lo haya indicado el mdico. Esto incluye:  Hacer entrenamiento de resistencia dos veces a la semana, como flexiones, sentadillas, levantar peso o usar bandas de resistencia.  Practicar 150minutos de ejercicios cardiovasculares cada semana, como caminar, correr o hacer algn deporte.  Mantenerse activo y no permanecer inactivo durante ms de 90minutos seguidos. Los perodos cortos de actividad tambin son beneficiosos. Tres sesiones de 10minutos a lo largo del da son tan beneficiosas como una sola sesin de 30minutos. Estas son algunas ideas para los ejercicios:  Lleve a pasear el perro.  Utilice las escaleras en lugar del ascensor.  Baile su cancin favorita.  Haga los ejercicios de un video de ejercicios.  Haga sus ejercicios favoritos con un amigo. RECOMENDACIONES PARA REALIZAR EJERCICIOS CUANDO SE TIENE DIABETES TIPO 1 O TIPO 2   Controle la glucosa en la sangre antes de comenzar. Si el nivel de glucosa en la sangre es de ms de 240 mg/dl, controle las cetonas en la orina. No haga actividad fsica si hay cetonas.  Evite   inyectarse insulina en las zonas del cuerpo que ejercitar. Por ejemplo, evite inyectarse insulina en:  Los brazos, si juega al tenis.  Las piernas, si corre.  Lleve un registro de:  Los alimentos que consume antes y despus de Tour managerhacer el ejercicio.  Los momentos esperables de picos de accin de la insulina.  Los niveles de glucosa en la sangre antes y despus de hacer ejercicios.  El tipo y cantidad de [place] Vincent and the Grenadinesactividad fsica que Biomedical engineerrealiza.  Revise los registros con su mdico. El mdico lo ayudar a Environmental education officerdesarrollar pautas para ajustar la cantidad de alimento y las cantidades de insulina antes y despus de Radio producerhacer ejercicios.  Si  toma insulina o agentes hipoglucemiantes por va oral, observe si hay signos y sntomas de hipoglucemia. Entre los que se incluyen:  Mareos.  Temblores.  Sudoracin.  Escalofros.  Confusin.  Beba gran cantidad de agua mientras hace ejercicios para evitar la deshidratacin o los golpes de Airline pilotcalor. Durante la actividad fsica se pierde agua corporal que se debe reponer.  Comente con su mdico antes de comenzar un programa de actividad fsica para verificar que sea seguro para usted. Recuerde, cualquier actividad es mejor que ninguna. Document Released: 01/03/2008 Document Revised: 04/30/2014 [facility] Patient Information 2015 [place]aryExitCare, [place]. This information is not intended to replace advice given to you by your health care provider. Make sure you discuss any questions you have with your health care provider. Plan de alimentacin DASH (DASH Eating Plan) DASH es la sigla en ingls de "Enfoques Alimentarios para Detener la Hipertensin". El plan de alimentacin DASH ha demostrado bajar la presin arterial elevada (hipertensin). Los beneficios adicionales para la salud pueden incluir la disminucin del riesgo de diabetes mellitus tipo2, enfermedades cardacas e ictus. Este plan tambin puede ayudar a Geophysical data processoradelgazar. QU DEBO SABER ACERCA DEL PLAN DE ALIMENTACIN DASH? Para el plan de alimentacin DASH, seguir las siguientes pautas generales:  Elija los alimentos con un valor porcentual diario de sodio de menos del 5% (segn figura en la etiqueta del alimento).  Use hierbas o aderezos sin sal, en lugar de sal de mesa o sal marina.  Consulte al mdico o farmacutico antes de usar sustitutos de la sal.  Coma productos con bajo contenido de sodio, cuya etiqueta suele decir "bajo contenido de sodio" o "sin agregado de sal".  Coma alimentos frescos.  Coma ms verduras, frutas y productos lcteos con bajo contenido de Zephyr Covegrasas.  Elija los cereales integrales. Busque la palabra "integral" en Musicianel  primer lugar de la lista de ingredientes.  Elija el pescado y el pollo o el pavo sin piel ms a menudo que las carnes rojas. Limite el consumo de pescado, carne de ave y carne a 6onzas (170g) por Futures traderda.  Limite el consumo de dulces, postres, azcares y bebidas azucaradas.  Elija las grasas saludables para el corazn.  Limite el consumo de queso a 1onza (28g) por Futures traderda.  Consuma ms comida casera y menos de restaurante, de buf y comida rpida.  Limite el consumo de alimentos fritos.  Cocine los alimentos utilizando mtodos que no sean la fritura.  Limite las verduras enlatadas. Si las consume, enjuguelas bien para disminuir el sodio.  Cuando coma en un restaurante, pida que preparen su comida con menos sal o, en lo posible, sin nada de sal. QU ALIMENTOS PUEDO COMER? Pida ayuda a un nutricionista para conocer las necesidades calricas individuales. Cereales Pan de salvado o integral. Arroz integral. Pastas de salvado o integrales. Quinua, trigo burgol y cereales integrales. Cereales con bajo contenido  de sodio. Tortillas de harina de maz o de salvado. Pan de maz integral. Galletas saladas integrales. Galletas con bajo contenido de [place]. Vegetales Verduras frescas o congeladas (crudas, al vapor, asadas o grilladas). Jugos de tomate y verduras con contenido bajo o reducido de sodio. Pasta y salsa de tomate con contenido bajo o reducido de sodio. Verduras enlatadas con bajo contenido de sodio o reducido de sodio.  Nils Pyle Nils Pyle frescas, en conserva (en su jugo natural) o frutas congeladas. Carnes y otros productos con protenas Carne de res molida (al 85% o ms [place]), carne de res de animales alimentados con pastos o carne de res sin la grasa. Pollo o pavo sin piel. Carne de pollo o de [place]. Cerdo sin la grasa. Todos los pescados y frutos de mar. Huevos. Porotos, guisantes o lentejas secos. Frutos secos y semillas sin sal. Frijoles enlatados sin sal. Lcteos Productos lcteos  con bajo contenido de grasas, como [place] o al 1%, quesos reducidos en grasas o al 2%, ricota con bajo contenido de grasas o [employer], o yogur natural con bajo contenido de [place]. Quesos con contenido bajo o reducido de sodio. Grasas y Writer en barra que no contengan grasas trans. [place]onesa y alios para ensaladas livianos o reducidos en grasas (reducidos en sodio). [place]. Aceites de crtamo, oliva o canola. Mantequilla natural de man o almendra. Otros Palomitas de maz y pretzels sin sal. Los artculos mencionados arriba pueden no ser [employer] de las bebidas o los alimentos recomendados. Comunquese con el nutricionista para conocer ms opciones. QU ALIMENTOS NO SE RECOMIENDAN? Cereales Pan blanco. Pastas blancas. Arroz blanco. Pan de maz refinado. Bagels y croissants. Galletas saladas que contengan grasas trans. Vegetales Vegetales con crema o fritos. Verduras en salsa de [place]. Verduras enlatadas comunes. Pasta y salsa de tomate en lata comunes. Jugos comunes de tomate y de verduras. Nils Pyle Frutas secas. Fruta enlatada en almbar liviano o espeso. Jugo de frutas. Carnes y otros productos con protenas [place] de carne con Holiday representative. Costillas, alas de pollo, tocineta, salchicha, mortadela, salame, chinchulines, tocino, perros calientes, salchichas alemanas y embutidos envasados. Frutos secos y semillas con sal. Frijoles con sal en lata. Lcteos Leche entera o al 2%, crema, mezcla de [place] y crema, y queso crema. Yogur entero o endulzado. Quesos o queso azul con alto contenido de Neurosurgeon. Cremas no lcteas y coberturas batidas. Quesos procesados, quesos para untar o cuajadas. Condimentos Sal de cebolla y ajo, sal condimentada, sal de mesa y sal marina. Salsas en lata y envasadas. Salsa Worcester[place]. Salsa trtara. Salsa barbacoa. Salsa teriyaki. Salsa de soja, incluso la que tiene contenido reducido de [place]. Salsa de carne. Salsa de pescado. Salsa de  [place]. Salsa rosada. Rbano picante. Ketchup y mostaza. Saborizantes y tiernizantes para carne. Caldo en cubitos. Salsa picante. Salsa tabasco. Adobos. Aderezos para tacos. Salsas. Grasas y [address], [place] en barra, [place] de [place], [place], [place] clarificada y Steffanie [place] de tocino. Aceites de coco, de palmiste o de palma. Aderezos comunes para ensalada. Otros Pickles y [place]ota. Palomitas de maz y pretzels con sal. Los artculos mencionados arriba pueden no ser [employer] de las bebidas y los alimentos que se Theatre stage manager. Comunquese con el nutricionista para obtener ms informacin. DNDE Raelyn Mora MS INFORMACIN? Instituto Nacional del [place], del Pulmn y de la Sangre (National Heart, Lung, and Blood Institute): [email or web address].it Document Released: 12/03/2011 Document Revised: 04/30/2014 [facility] Patient Information 2015 [place], [place]. This information is not intended to replace advice given to you  by your health care provider. Make sure you discuss any questions you have with your health care provider. Hipertensin (Hypertension) La hipertensin, conocida comnmente como presin arterial alta, se produce cuando la sangre bombea en las arterias con mucha fuerza. Las arterias son los vasos sanguneos que transportan la sangre desde el corazn hacia todas las partes del cuerpo. Una lectura de la presin arterial consiste en un nmero ms alto sobre un nmero ms bajo, por ejemplo, 110/72. El nmero ms alto (presin sistlica) corresponde a la presin interna de las arterias cuando el corazn [place] Mountainbombea sangre. El nmero ms bajo (presin diastlica) corresponde a la presin interna de las arterias cuando el corazn se relaja. En condiciones ideales, la presin arterial debe ser inferior a 120/80. La hipertensin fuerza al corazn a trabajar ms para Marine scientistbombear la sangre. Las arterias pueden estrecharse o ponerse rgidas. La hipertensin  conlleva el riesgo de enfermedad cardaca, ictus y otros problemas.  FACTORES DE RIESGO Algunos factores de riesgo de hipertensin son controlables, pero otros no lo son.  DynegyEntre los factores de riesgo que usted no puede Chief Operating Officercontrolar, se incluyen:   Nurse, learning disabilityLa raza. El riesgo es mayor para las Statisticianpersonas afroamericanas.  La edad. Los riesgos aumentan con la edad.  El sexo. Antes de los 45aos, los hombres corren ms Goodyear Tireriesgo que las mujeres. Despus de los 65aos, las mujeres corren ms Lexmark Internationalriesgo que los hombres. Entre los factores de riesgo que usted puede Chief Operating Officercontrolar, se incluyen:  No hacer la cantidad suficiente de actividad fsica o ejercicio.  Tener sobrepeso.  Consumir mucha grasa, azcar, caloras o sal en la dieta.  Beber alcohol en exceso. SIGNOS Y SNTOMAS Por lo general, la hipertensin no causa signos o sntomas. La hipertensin demasiado alta (crisis hipertensiva) puede causar dolor de cabeza, ansiedad, falta de aire y hemorragia nasal. DIAGNSTICO  Para detectar si usted tiene hipertensin, el mdico le medir la presin arterial mientras est sentado, con el brazo levantado a la altura del corazn. Debe medirla al [facility] Surgery Center Irvingmenos dos veces en el mismo brazo. Determinadas condiciones pueden causar una diferencia de presin arterial entre el brazo izquierdo y Aeronautical engineerel derecho. El hecho de tener una sola lectura de la presin arterial ms alta que lo normal no significa que Research scientist (physical sciences)necesita un tratamiento. En el caso de tener una lectura de la presin arterial con un valor alto, pdale al mdico que la verifique nuevamente. TRATAMIENTO  El tratamiento de la hipertensin arterial incluye hacer cambios en el estilo de vida y, posiblemente, tomar medicamentos. Un estilo de vida saludable puede ayudar a bajar la presin arterial alta. Quiz deba cambiar algunos hbitos. Los Baker Hughes Incorporatedcambios en el estilo de vida pueden incluir:  Seguir la dieta DASH. Esta dieta tiene un alto contenido de frutas, verduras y Radiation protection practitionercereales integrales.  Incluye poca cantidad de sal, carnes rojas y azcares agregados.  Hacer al menos 2horas de actividad fsica enrgica todas las semanas.  Perder peso, si es necesario.  No fumar.  Limitar el consumo de bebidas alcohlicas.  Aprender formas de reducir el estrs. Si los cambios en el estilo de vida no son suficientes para Museum/gallery curatorlograr controlar la presin arterial, el mdico puede recetarle medicamentos. Quiz necesite tomar ms de uno. Trabaje en conjunto con su mdico para comprender los riesgos y los beneficios. INSTRUCCIONES PARA EL CUIDADO EN EL HOGAR  Haga que le midan de nuevo la presin arterial segn las indicaciones del mdico.  Tome los medicamentos solamente como se lo haya indicado el mdico. Siga cuidadosamente las indicaciones. Los medicamentos para la  presin arterial deben tomarse segn las indicaciones. Los medicamentos pierden eficacia al omitir las dosis. El hecho de omitir las dosis tambin [place] el riesgo de otros problemas.  No fume.  Contrlese la presin arterial en su casa segn las indicaciones del mdico. SOLICITE ATENCIN MDICA SI:   Piensa que tiene una reaccin alrgica a los medicamentos.  Tiene mareos o dolores de cabeza con Naval architect.  Tiene hinchazn en los tobillos.  Tiene problemas de visin. SOLICITE ATENCIN MDICA DE INMEDIATO SI:  Siente un dolor de cabeza intenso o confusin.  Siente debilidad inusual, adormecimiento o que Hospital doctor.  Siente dolor intenso en el pecho o en el abdomen.  Vomita repetidas veces.  Tiene dificultad para respirar. ASEGRESE DE QUE:   Comprende estas instrucciones.  Controlar su afeccin.  Recibir ayuda de inmediato si no mejora o si empeora. Document Released: 12/14/2005 Document Revised: 04/30/2014 [facility] Patient Information 2015 [place], [place]. This information is not intended to replace advice given to you by your health care provider. Make sure you discuss any questions you have with your  health care provider.

## 2015-03-01 ENCOUNTER — Telehealth: Payer: Self-pay | Admitting: Emergency Medicine

## 2015-03-01 NOTE — Telephone Encounter (Signed)
Family member came by to pick up DMV form for handcap placard

## 2015-03-18 ENCOUNTER — Telehealth: Payer: Self-pay | Admitting: *Deleted

## 2015-03-18 ENCOUNTER — Ambulatory Visit: Payer: No Typology Code available for payment source

## 2015-03-18 ENCOUNTER — Ambulatory Visit (INDEPENDENT_AMBULATORY_CARE_PROVIDER_SITE_OTHER): Payer: No Typology Code available for payment source | Admitting: Podiatry

## 2015-03-18 DIAGNOSIS — M79675 Pain in left toe(s): Secondary | ICD-10-CM

## 2015-03-18 DIAGNOSIS — M79671 Pain in right foot: Secondary | ICD-10-CM

## 2015-03-18 DIAGNOSIS — R0989 Other specified symptoms and signs involving the circulatory and respiratory systems: Secondary | ICD-10-CM

## 2015-03-18 NOTE — Progress Notes (Signed)
Subjective:     Patient ID: Samantha [name], female   DOB: 03/01/1951, 64 y.o.   MRN: [id number]  HPI patient presents with painful fourth toe left states it's not been draining but he gets sore at times and she is a diabetic and wanted to have it checked   Review of Systems  All other systems reviewed and are negative.      Objective:   Physical Exam  Constitutional: She is oriented to person, place, and time.  Musculoskeletal: Normal range of motion.  Neurological: She is oriented to person, place, and time.  Skin: Skin is warm and dry.  Nursing note and vitals reviewed.  neurovascular status is diminished  left over right with diminished Pedal time noted and a small distal lateral keratotic lesion fourth toe left that is not draining and is crusted over with some digital deformity noted of the toe. Mild discomfort dorsal right foot nondescript in nature and digits are found to be well perfused patient's well oriented and presents with interpreter and no equinus condition was noted     Assessment:     Small distal lateral keratotic lesion fourth left which may be due to pressure of walking with slight possibility for vascular issues which may be contributory to problem    Plan:     H&P and x-rays reviewed with patient's family. Today buttress pad applied to lift the fourth toe and take pressure off of it and I instructed them if any drainage should occur redness or changes in the toe or other any kind of pathology patient's to reappoint immediately. We will have her have a Doppler done at [facility] in the next several weeks

## 2015-03-18 NOTE — Progress Notes (Signed)
   Subjective:    Patient ID: Samantha CrandallMaria [name], female    DOB: [birth year], 64 y.o.   MRN: [id number]  HPI  Pt presents with left 4th toe ulcer, she is diabetic, toe is painful  Review of Systems  All other systems reviewed and are negative.      Objective:   Physical Exam        Assessment & Plan:

## 2015-03-18 NOTE — Telephone Encounter (Signed)
I attempted to call patient to see inform her that Dr. Charlsie Merlesegal wants her to have a lower arterial doppler.  [facility] should give her a call to schedule the appointment.  No one answered phone and there was no voicemail.

## 2015-03-20 NOTE — Telephone Encounter (Signed)
I called and spoke to the patient's daughter about [facility].  I asked that she translate to her mother that, we were sending her to have her circulation checked to make sure she has adequate blood flow going to her feet for healing.  I asked her to call for her mother due to her language barrier to schedule the appointment.  There phone number is [phone].  "Okay, I will call them."

## 2015-03-29 ENCOUNTER — Ambulatory Visit (HOSPITAL_COMMUNITY)
Admission: RE | Admit: 2015-03-29 | Discharge: 2015-03-29 | Disposition: A | Discharge status: Home / Self Care | Payer: Self-pay | Source: Ambulatory Visit | Attending: Cardiovascular Disease | Admitting: Cardiovascular Disease

## 2015-03-29 DIAGNOSIS — R0989 Other specified symptoms and signs involving the circulatory and respiratory systems: Secondary | ICD-10-CM

## 2015-03-29 DIAGNOSIS — I70223 Atherosclerosis of native arteries of extremities with rest pain, bilateral legs: Secondary | ICD-10-CM | POA: Insufficient documentation

## 2015-03-29 NOTE — Progress Notes (Signed)
Lower extremity arterial duplex completed. Evidence for mild arterial insufficiency on the right lower extremity and moderate arterial insufficiency on the left lower extremity with a 70-99% diameter reduction in the left distal superficial femoral artery. Samantha [name],RVT

## 2015-04-04 ENCOUNTER — Ambulatory Visit: Payer: Self-pay | Attending: Internal Medicine | Admitting: Internal Medicine

## 2015-04-04 ENCOUNTER — Encounter: Payer: Self-pay | Admitting: Internal Medicine

## 2015-04-04 ENCOUNTER — Telehealth: Payer: Self-pay | Admitting: *Deleted

## 2015-04-04 VITALS — BP 160/75 | HR 70 | Temp 98.0°F | Resp 16 | Ht 62.0 in | Wt 151.0 lb

## 2015-04-04 DIAGNOSIS — E1140 Type 2 diabetes mellitus with diabetic neuropathy, unspecified: Secondary | ICD-10-CM

## 2015-04-04 DIAGNOSIS — F329 Major depressive disorder, single episode, unspecified: Secondary | ICD-10-CM

## 2015-04-04 DIAGNOSIS — E11621 Type 2 diabetes mellitus with foot ulcer: Secondary | ICD-10-CM

## 2015-04-04 DIAGNOSIS — F32A Depression, unspecified: Secondary | ICD-10-CM | POA: Insufficient documentation

## 2015-04-04 DIAGNOSIS — L97529 Non-pressure chronic ulcer of other part of left foot with unspecified severity: Secondary | ICD-10-CM

## 2015-04-04 DIAGNOSIS — I739 Peripheral vascular disease, unspecified: Secondary | ICD-10-CM | POA: Insufficient documentation

## 2015-04-04 DIAGNOSIS — E118 Type 2 diabetes mellitus with unspecified complications: Secondary | ICD-10-CM

## 2015-04-04 DIAGNOSIS — I10 Essential (primary) hypertension: Secondary | ICD-10-CM

## 2015-04-04 MED ORDER — SIMVASTATIN 40 MG PO TABS: 40.0000 mg | ORAL_TABLET | Freq: Every day | ORAL | Status: DC

## 2015-04-04 MED ORDER — METOPROLOL TARTRATE 100 MG PO TABS: 100.0000 mg | ORAL_TABLET | Freq: Two times a day (BID) | ORAL | Status: DC

## 2015-04-04 MED ORDER — GABAPENTIN 100 MG PO CAPS: 100.0000 mg | ORAL_CAPSULE | Freq: Three times a day (TID) | ORAL | Status: DC

## 2015-04-04 MED ORDER — GLIMEPIRIDE 4 MG PO TABS: 4.0000 mg | ORAL_TABLET | Freq: Every day | ORAL | Status: DC

## 2015-04-04 MED ORDER — METFORMIN HCL 1000 MG PO TABS: 1000.0000 mg | ORAL_TABLET | Freq: Two times a day (BID) | ORAL | Status: DC

## 2015-04-04 MED ORDER — FLUOXETINE HCL 20 MG PO TABS: 20.0000 mg | ORAL_TABLET | Freq: Every day | ORAL | Status: DC

## 2015-04-04 MED ORDER — INSULIN NPH ISOPHANE & REGULAR (70-30) 100 UNIT/ML ~~LOC~~ SUSP: 16.0000 [IU] | Freq: Two times a day (BID) | SUBCUTANEOUS | Status: DC

## 2015-04-04 MED ORDER — LOSARTAN POTASSIUM 25 MG PO TABS: 25.0000 mg | ORAL_TABLET | Freq: Every day | ORAL | Status: DC

## 2015-04-04 NOTE — Progress Notes (Signed)
Patient ID: Samantha [name], female   DOB: 1951-03-19, 64 y.o.   MRN: [id number]   Samantha [name], is a 64 y.o. female  [id number]  [id number]  DOB - 1951/07/05  Chief Complaint  Patient presents with  . Follow-up  . Medication Refill        Subjective:   Samantha [name] is a 64 y.o. female here today for a follow up visit. Patient has history of hypertension, diabetes mellitus, diabetic foot and hyperlipidemia. She was referred to podiatrist for diabetic ulcer of left foot and after evaluation patient is being referred to vascular surgery for critical limb ischemia. Patient is here today for routine follow-up and for review of her lower extremity arterial Doppler examination was done recently. She has no new complaints today. She needs refills on her medications. Patient has No headache, No chest pain, No abdominal pain - No Nausea, No new weakness tingling or numbness, No Cough - SOB.  Problem  Depression  Pad (Peripheral Artery Disease)    ALLERGIES: No Known Allergies  PAST MEDICAL HISTORY: Past Medical History  Diagnosis Date  . Hypertension   . Diabetes mellitus without complication     MEDICATIONS AT HOME: Prior to Admission medications   Medication Sig Start Date End Date Taking? Authorizing Provider  glimepiride (AMARYL) 4 MG tablet Take 1 tablet (4 mg total) by mouth daily with breakfast. 04/04/15  Yes Quentin Angst, MD  insulin NPH-regular Human (NOVOLIN 70/30) (70-30) 100 UNIT/ML injection Inject 16 Units into the skin 2 (two) times daily with a meal. 04/04/15  Yes Quentin Angst, MD  Insulin Pen Needle (PEN NEEDLES 3/16") 31G X 5 MM MISC 1 Stick by Does not apply route 2 (two) times daily at 8 am and 10 pm. 01/14/15  Yes Tora Kindred York, PA-C  losartan (COZAAR) 25 MG tablet Take 1 tablet (25 mg total) by mouth daily. 04/04/15  Yes Quentin Angst, MD  metFORMIN (GLUCOPHAGE) 1000 MG tablet Take 1 tablet (1,000 mg total) by mouth 2 (two)  times daily with a meal. 04/04/15  Yes Nichalas Coin E Hyman Hopes, MD  metoprolol (LOPRESSOR) 100 MG tablet Take 1 tablet (100 mg total) by mouth 2 (two) times daily. 04/04/15  Yes Quentin Angst, MD  aspirin EC 81 MG EC tablet Take 1 tablet (81 mg total) by mouth daily. Patient not taking: Reported on 04/04/2015 01/14/15   Stephani Police, PA-C  docusate sodium 100 MG CAPS Take 100 mg by mouth daily. Patient not taking: Reported on 04/04/2015 01/14/15   Stephani Police, PA-C  FLUoxetine (PROZAC) 20 MG tablet Take 1 tablet (20 mg total) by mouth daily. 04/04/15   Quentin Angst, MD  gabapentin (NEURONTIN) 100 MG capsule Take 1 capsule (100 mg total) by mouth 3 (three) times daily. 04/04/15   Quentin Angst, MD  ibuprofen (ADVIL,MOTRIN) 200 MG tablet Take 400 mg by mouth every 6 (six) hours as needed (pain/fever).    Historical Provider, MD  simvastatin (ZOCOR) 40 MG tablet Take 1 tablet (40 mg total) by mouth at bedtime. 04/04/15   Quentin Angst, MD     Objective:   Filed Vitals:   04/04/15 1709  BP: 160/75  Pulse: 70  Temp: 98 F (36.7 C)  Resp: 16  Height:  (1.575 m)  Weight: 151 lb (68.493 kg)  SpO2: 98%    Exam General appearance : Awake, alert, not in any distress. Speech Clear. Not toxic looking HEENT: Atraumatic and Normocephalic, pupils equally  reactive to light and accomodation Neck: supple, no JVD. No cervical lymphadenopathy.  Chest:Good air entry bilaterally, no added sounds  CVS: S1 S2 regular, no murmurs.  Abdomen: Bowel sounds present, Non tender and not distended with no gaurding, rigidity or rebound. Extremities: B/L Lower Ext shows no edema, both legs are warm to touch Neurology: Awake alert, and oriented X 3, CN II-XII intact, Non focal Skin:No Rash  Data Review Lab Results  Component Value Date   HGBA1C 7.30 02/25/2015   HGBA1C 10.1* 01/10/2015     Assessment & Plan  Arterial Doppler of the lower extremity revealed and discussed with patient.  Results showed occlusive disease on the left. Patient will be followed up with vascular surgeon as scheduled next week.  1. Type 2 diabetes with complication  - metFORMIN (GLUCOPHAGE) 1000 MG tablet; Take 1 tablet (1,000 mg total) by mouth 2 (two) times daily with a meal.  Dispense: 180 tablet; Refill: 3 - glimepiride (AMARYL) 4 MG tablet; Take 1 tablet (4 mg total) by mouth daily with breakfast.  Dispense: 90 tablet; Refill: 3 - insulin NPH-regular Human (NOVOLIN 70/30) (70-30) 100 UNIT/ML injection; Inject 16 Units into the skin 2 (two) times daily with a meal.  Dispense: 30 mL; Refill: 3  2. Diabetic ulcer of left foot associated with type 2 diabetes mellitus  Follow up with podiatrist, wound care and continue tight BG control   Aim for 30 minutes of exercise most days. Rethink what you drink. Water is great! Aim for 2-3 Carb Choices per meal (30-45 grams) +/- 1 either way  Aim for 0-15 Carbs per snack if hungry  Include protein in moderation with your meals and snacks  Consider reading food labels for Total Carbohydrate and Fat Grams of foods  Consider checking BG at alternate times per day  Continue taking medication as directed Be mindful about how much sugar you are adding to beverages and other foods. Fruit Punch - find one with no sugar  Measure and decrease portions of carbohydrate foods  Make your plate and don't go back for seconds   3. Essential hypertension  - metoprolol (LOPRESSOR) 100 MG tablet; Take 1 tablet (100 mg total) by mouth 2 (two) times daily.  Dispense: 180 tablet; Refill: 3 - losartan (COZAAR) 25 MG tablet; Take 1 tablet (25 mg total) by mouth daily.  Dispense: 90 tablet; Refill: 3  We have discussed target BP range and blood pressure goal. I have advised patient to check BP regularly and to call us back or report to clinic if the numbers are consistently higher than 140/90. We discussed the importance of compliance with medical therapy and DASH diet  recommended, consequences of uncontrolled hypertension discussed.  - continue current BP medications  4. Diabetic neuropathy, painful  - gabapentin (NEURONTIN) 100 MG capsule; Take 1 capsule (100 mg total) by mouth 3 (three) times daily.  Dispense: 270 capsule; Refill: 3  5. PAD (peripheral artery disease)  - simvastatin (ZOCOR) 40 MG tablet; Take 1 tablet (40 mg total) by mouth at bedtime.  Dispense: 90 tablet; Refill: 3 - Ambulatory referral to Vascular Surgery  6. Depression  - FLUoxetine (PROZAC) 20 MG tablet; Take 1 tablet (20 mg total) by mouth daily.  Dispense: 30 tablet; Refill: 3  Patient have been counseled extensively about nutrition and exercise  Interpreter was used to communicate directly with patient for the entire encounter including providing detailed patient instructions.   Return in about 2 weeks (around 04/18/2015) for BP Check, Nurse  V, BP Check, Nurse Visit, Follow up HTN.  The patient was given clear instructions to go to ER or return to medical center if symptoms don't improve, worsen or new problems develop. The patient verbalized understanding. The patient was told to call to get lab results if they haven't heard anything in the next week.   This note has been created with Education officer, environmentalDragon speech recognition software and smart phrase technology. Any transcriptional errors are unintentional.    Jeanann LewandowskyJEGEDE, Carla Whilden, MD, MHA, CPE, FACP, FAAP [facility] [place], [place] [phone]   04/04/2015, 6:01 PM

## 2015-04-04 NOTE — Progress Notes (Signed)
Patient here to follow up after seeing the podiatrist for an ulcer on 4th toe on her left foot Patient needs refills on metformin, insulin, metoprolol, losartan  She also would like results from a vasculature study she had

## 2015-04-04 NOTE — Telephone Encounter (Signed)
Lm for patient to contact me. She needs an office visit per Dr Allyson SabalBerry to review her doppler results

## 2015-04-04 NOTE — Patient Instructions (Signed)
Plan de alimentacin DASH (DASH Eating Plan) DASH es la sigla en ingls de "Enfoques Alimentarios para Detener la Hipertensin". El plan de alimentacin DASH ha demostrado bajar la presin arterial elevada (hipertensin). Los beneficios adicionales para la salud pueden incluir la disminucin del riesgo de diabetes mellitus tipo2, enfermedades cardacas e ictus. Este plan tambin puede ayudar a Horticulturist, commercial. QU DEBO SABER ACERCA DEL PLAN DE ALIMENTACIN DASH? Para el plan de alimentacin DASH, seguir las siguientes pautas generales:  Elija los alimentos con un valor porcentual diario de sodio de menos del 5% (segn figura en la etiqueta del alimento).  Use hierbas o aderezos sin sal, en lugar de sal de mesa o sal marina.  Consulte al mdico o farmacutico antes de usar sustitutos de la sal.  Coma productos con bajo contenido de sodio, cuya etiqueta suele decir "bajo contenido de sodio" o "sin agregado de sal".  Coma alimentos frescos.  Coma ms verduras, frutas y productos lcteos con bajo contenido de [place].  Elija los cereales integrales. Busque la palabra "integral" en Equities trader de la lista de ingredientes.  Elija el pescado y el pollo o el pavo sin piel ms a menudo que las carnes rojas. Limite el consumo de pescado, carne de ave y carne a 6onzas (170g) por Training and development officer.  Limite el consumo de dulces, postres, azcares y bebidas azucaradas.  Elija las grasas saludables para el corazn.  Limite el consumo de queso a 1onza (28g) por Training and development officer.  Consuma ms comida casera y menos de restaurante, de buf y comida rpida.  Limite el consumo de alimentos fritos.  Cocine los alimentos utilizando mtodos que no sean la fritura.  Limite las verduras enlatadas. Si las consume, enjuguelas bien para disminuir el sodio.  Cuando coma en un restaurante, pida que preparen su comida con menos sal o, en lo posible, sin nada de sal. QU ALIMENTOS PUEDO COMER? Pida ayuda a un nutricionista para  conocer las necesidades calricas individuales. Cereales Pan de salvado o integral. Arroz integral. Pastas de salvado o integrales. Quinua, trigo burgol y cereales integrales. Cereales con bajo contenido de sodio. Tortillas de harina de maz o de salvado. Pan de maz integral. Galletas saladas integrales. Galletas con bajo contenido de [place]. Vegetales Verduras frescas o congeladas (crudas, al vapor, asadas o grilladas). Jugos de tomate y verduras con contenido bajo o reducido de sodio. Pasta y salsa de tomate con contenido bajo o [place]. Verduras enlatadas con bajo contenido de sodio o reducido de sodio.  Lambert Mody Lambert Mody frescas, en conserva (en su jugo natural) o frutas congeladas. Carnes y otros productos con protenas Carne de res molida (al 85% o ms [place]), carne de res de animales alimentados con pastos o carne de res sin la grasa. Pollo o pavo sin piel. Carne de pollo o de [place]. Cerdo sin la grasa. Todos los pescados y frutos de mar. Huevos. Porotos, guisantes o lentejas secos. Frutos secos y semillas sin sal. Frijoles enlatados sin sal. Lcteos Productos lcteos con bajo contenido de grasas, como [place] o al 1%, quesos reducidos en grasas o al 2%, ricota con bajo contenido de grasas o [employer], o yogur natural con bajo contenido de [place]. Quesos con contenido bajo o reducido de sodio. Grasas y Naval architect en barra que no contengan grasas trans. [place] y alios para ensaladas livianos o reducidos en grasas (reducidos en sodio). [place]. Aceites de crtamo, oliva o canola. Mantequilla natural de man o almendra. Otros Palomitas de maz y pretzels sin sal.  Los artculos mencionados arriba pueden no ser una lista completa de las bebidas o los alimentos recomendados. Comunquese con el nutricionista para conocer ms opciones. QU ALIMENTOS NO SE RECOMIENDAN? Cereales Pan blanco. Pastas blancas. Arroz blanco. Pan de maz refinado. Bagels y  croissants. Galletas saladas que contengan grasas trans. Vegetales Vegetales con crema o fritos. Verduras en salsa de queso. Verduras enlatadas comunes. Pasta y salsa de tomate en lata comunes. Jugos comunes de tomate y de verduras. Frutas Frutas secas. Fruta enlatada en almbar liviano o espeso. Jugo de frutas. Carnes y otros productos con protenas [place] de carne con grasa. Costillas, alas de pollo, tocineta, salchicha, mortadela, salame, chinchulines, tocino, perros calientes, salchichas alemanas y embutidos envasados. Frutos secos y semillas con sal. Frijoles con sal en lata. Lcteos Leche entera o al 2%, crema, mezcla de leche y crema, y queso crema. Yogur entero o endulzado. Quesos o queso azul con alto contenido de grasas. Cremas no lcteas y coberturas batidas. Quesos procesados, quesos para untar o cuajadas. Condimentos Sal de cebolla y ajo, sal condimentada, sal de mesa y sal marina. Salsas en lata y envasadas. Salsa [place]. Salsa trtara. Salsa barbacoa. Salsa teriyaki. Salsa de soja, incluso la que tiene contenido reducido de sodio. Salsa de carne. Salsa de pescado. Salsa de ostras. Salsa rosada. Rbano picante. Ketchup y mostaza. Saborizantes y tiernizantes para carne. Caldo en cubitos. Salsa picante. Salsa tabasco. Adobos. Aderezos para tacos. Salsas. Grasas y aceites Mantequilla, margarina en barra, manteca de cerdo, grasa, mantequilla clarificada y grasa de tocino. Aceites de coco, de palmiste o de palma. Aderezos comunes para ensalada. Otros Pickles y aceitunas. Palomitas de maz y pretzels con sal. Los artculos mencionados arriba pueden no ser una lista completa de las bebidas y los alimentos que se deben evitar. Comunquese con el nutricionista para obtener ms informacin. DNDE PUEDO ENCONTRAR MS INFORMACIN? Instituto Nacional del Corazn, del Pulmn y de la Sangre (National Heart, Lung, and Blood Institute):  www.nhlbi.nih.gov/health/health-topics/topics/dash/ Document Released: 12/03/2011 Document Revised: 04/30/2014 ExitCare Patient Information 2015 ExitCare, LLC. This information is not intended to replace advice given to you by your health care provider. Make sure you discuss any questions you have with your health care provider. Hipertensin (Hypertension) La hipertensin, conocida comnmente como presin arterial alta, se produce cuando la sangre bombea en las arterias con mucha fuerza. Las arterias son los vasos sanguneos que transportan la sangre desde el corazn hacia todas las partes del cuerpo. Una lectura de la presin arterial consiste en un nmero ms alto sobre un nmero ms bajo, por ejemplo, 110/72. El nmero ms alto (presin sistlica) corresponde a la presin interna de las arterias cuando el corazn bombea sangre. El nmero ms bajo (presin diastlica) corresponde a la presin interna de las arterias cuando el corazn se relaja. En condiciones ideales, la presin arterial debe ser inferior a 120/80. La hipertensin fuerza al corazn a trabajar ms para bombear la sangre. Las arterias pueden estrecharse o ponerse rgidas. La hipertensin conlleva el riesgo de enfermedad cardaca, ictus y otros problemas.  FACTORES DE RIESGO Algunos factores de riesgo de hipertensin son controlables, pero otros no lo son.  Entre los factores de riesgo que usted no puede controlar, se incluyen:   La raza. El riesgo es mayor para las personas afroamericanas.  La edad. Los riesgos aumentan con la edad.  El sexo. Antes de los 45aos, los hombres corren ms riesgo que las mujeres. Despus de los 65aos, las mujeres corren ms riesgo que los hombres. Entre los factores de riesgo   que usted puede controlar, se incluyen:  No hacer la cantidad suficiente de actividad fsica o ejercicio.  Tener sobrepeso.  Consumir mucha grasa, azcar, caloras o sal en la dieta.  Beber alcohol en exceso. SIGNOS Y  SNTOMAS Por lo general, la hipertensin no causa signos o sntomas. La hipertensin demasiado alta (crisis hipertensiva) puede causar dolor de cabeza, ansiedad, falta de aire y hemorragia nasal. DIAGNSTICO  Para detectar si usted tiene hipertensin, el mdico le medir la presin arterial mientras est sentado, con el brazo levantado a la altura del corazn. Debe medirla al menos dos veces en el mismo brazo. Determinadas condiciones pueden causar una diferencia de presin arterial entre el brazo izquierdo y el derecho. El hecho de tener una sola lectura de la presin arterial ms alta que lo normal no significa que necesita un tratamiento. En el caso de tener una lectura de la presin arterial con un valor alto, pdale al mdico que la verifique nuevamente. TRATAMIENTO  El tratamiento de la hipertensin arterial incluye hacer cambios en el estilo de vida y, posiblemente, tomar medicamentos. Un estilo de vida saludable puede ayudar a bajar la presin arterial alta. Quiz deba cambiar algunos hbitos. Los cambios en el estilo de vida pueden incluir:  Seguir la dieta DASH. Esta dieta tiene un alto contenido de frutas, verduras y cereales integrales. Incluye poca cantidad de sal, carnes rojas y azcares agregados.  Hacer al menos 2horas de actividad fsica enrgica todas las semanas.  Perder peso, si es necesario.  No fumar.  Limitar el consumo de bebidas alcohlicas.  Aprender formas de reducir el estrs. Si los cambios en el estilo de vida no son suficientes para lograr controlar la presin arterial, el mdico puede recetarle medicamentos. Quiz necesite tomar ms de uno. Trabaje en conjunto con su mdico para comprender los riesgos y los beneficios. INSTRUCCIONES PARA EL CUIDADO EN EL HOGAR  Haga que le midan de nuevo la presin arterial segn las indicaciones del mdico.  Tome los medicamentos solamente como se lo haya indicado el mdico. Siga cuidadosamente las indicaciones. Los  medicamentos para la presin arterial deben tomarse segn las indicaciones. Los medicamentos pierden eficacia al omitir las dosis. El hecho de omitir las dosis tambin aumenta el riesgo de otros problemas.  No fume.  Contrlese la presin arterial en su casa segn las indicaciones del mdico. SOLICITE ATENCIN MDICA SI:   Piensa que tiene una reaccin alrgica a los medicamentos.  Tiene mareos o dolores de cabeza con recurrencia.  Tiene hinchazn en los tobillos.  Tiene problemas de visin. SOLICITE ATENCIN MDICA DE INMEDIATO SI:  Siente un dolor de cabeza intenso o confusin.  Siente debilidad inusual, adormecimiento o que se desmayar.  Siente dolor intenso en el pecho o en el abdomen.  Vomita repetidas veces.  Tiene dificultad para respirar. ASEGRESE DE QUE:   Comprende estas instrucciones.  Controlar su afeccin.  Recibir ayuda de inmediato si no mejora o si empeora. Document Released: 12/14/2005 Document Revised: 04/30/2014 ExitCare Patient Information 2015 ExitCare, LLC. This information is not intended to replace advice given to you by your health care provider. Make sure you discuss any questions you have with your health care provider. Diabetes y actividad fsica (Diabetes and Exercise) Hacer actividad fsica con regularidad es muy importante. No se trata solo de perder peso. Tiene muchos otros beneficios, como por ejemplo:  Mejorar el estado fsico, la flexibilidad y la resistencia.  Aumenta la densidad sea.  Ayuda a controlar el peso.  Disminuye la grasa corporal.    Aumenta la fuerza muscular.  Reduce el estrs y las tensiones.  Mejora el estado de salud general. Las personas diabticas que realizan actividad fsica tienen beneficios adicionales debido al ejercicio:  Reduce el apetito.  El organismo mejora el uso del azcar (glucosa) de la sangre.  Ayuda a disminuir o controlar la glucosa en la sangre.  Disminuye la presin  arterial.  Ayuda a disminuir los lpidos en la sangre (colesterol y triglicridos).  El organismo mejora el uso de la insulina porque:  Aumenta la sensibilidad del organismo a la insulina.  Reduce las necesidades de insulina del organismo.  Disminuye el riesgo de enfermedad cardaca por la actividad fsica ya que  disminuye el colesterol y tambin los triglicridos.  Aumenta los niveles de colesterol bueno (como las lipoprotenas de alta densidad [HDL]) en el organismo.  Disminuye los niveles de glucosa en la sangre. SU PLAN DE ACTIVIDAD  Elija una actividad que disfrute y establezca objetivos realistas. Su mdico o educador en diabetes podrn ayudarlo a encontrar una actividad que lo beneficie. Haga ejercicio regularmente como se lo haya indicado el mdico. Esto incluye:  Hacer entrenamiento de resistencia dos veces a la semana, como flexiones, sentadillas, levantar peso o usar bandas de resistencia.  Practicar 150minutos de ejercicios cardiovasculares cada semana, como caminar, correr o hacer algn deporte.  Mantenerse activo y no permanecer inactivo durante ms de 90minutos seguidos. Los perodos cortos de actividad tambin son beneficiosos. Tres sesiones de 10minutos a lo largo del da son tan beneficiosas como una sola sesin de 30minutos. Estas son algunas ideas para los ejercicios:  Lleve a pasear el perro.  Utilice las escaleras en lugar del ascensor.  Baile su cancin favorita.  Haga los ejercicios de un video de ejercicios.  Haga sus ejercicios favoritos con un amigo. RECOMENDACIONES PARA REALIZAR EJERCICIOS CUANDO SE TIENE DIABETES TIPO 1 O TIPO 2   Controle la glucosa en la sangre antes de comenzar. Si el nivel de glucosa en la sangre es de ms de 240 mg/dl, controle las cetonas en la orina. No haga actividad fsica si hay cetonas.  Evite inyectarse insulina en las zonas del cuerpo que ejercitar. Por ejemplo, evite inyectarse insulina en:  Los brazos, si  juega al tenis.  Las piernas, si corre.  Lleve un registro de:  Los alimentos que consume antes y despus de hacer el ejercicio.  Los momentos esperables de picos de accin de la insulina.  Los niveles de glucosa en la sangre antes y despus de hacer ejercicios.  El tipo y cantidad de actividad fsica que realiza.  Revise los registros con su mdico. El mdico lo ayudar a desarrollar pautas para ajustar la cantidad de alimento y las cantidades de insulina antes y despus de hacer ejercicios.  Si toma insulina o agentes hipoglucemiantes por va oral, observe si hay signos y sntomas de hipoglucemia. Entre los que se incluyen:  Mareos.  Temblores.  Sudoracin.  Escalofros.  Confusin.  Beba gran cantidad de agua mientras hace ejercicios para evitar la deshidratacin o los golpes de calor. Durante la actividad fsica se pierde agua corporal que se debe reponer.  Comente con su mdico antes de comenzar un programa de actividad fsica para verificar que sea seguro para usted. Recuerde, cualquier actividad es mejor que ninguna. Document Released: 01/03/2008 Document Revised: 04/30/2014 ExitCare Patient Information 2015 ExitCare, LLC. This information is not intended to replace advice given to you by your health care provider. Make sure you discuss any questions you have with your health   care provider.  

## 2015-04-08 NOTE — Telephone Encounter (Signed)
I spoke with patient's daughter (Patient was there but does not speak [place]).  Appt made for patient to see Dr Allyson SabalBerry,

## 2015-04-11 ENCOUNTER — Other Ambulatory Visit: Payer: Self-pay

## 2015-04-11 DIAGNOSIS — I739 Peripheral vascular disease, unspecified: Secondary | ICD-10-CM

## 2015-04-12 ENCOUNTER — Encounter: Payer: Self-pay | Admitting: Cardiovascular Disease

## 2015-04-12 ENCOUNTER — Ambulatory Visit (INDEPENDENT_AMBULATORY_CARE_PROVIDER_SITE_OTHER): Payer: Self-pay | Admitting: Cardiovascular Disease

## 2015-04-12 VITALS — BP 168/86 | HR 68 | Ht 63.0 in | Wt 149.0 lb

## 2015-04-12 DIAGNOSIS — D689 Coagulation defect, unspecified: Secondary | ICD-10-CM

## 2015-04-12 DIAGNOSIS — R079 Chest pain, unspecified: Secondary | ICD-10-CM

## 2015-04-12 DIAGNOSIS — Z01818 Encounter for other preprocedural examination: Secondary | ICD-10-CM

## 2015-04-12 DIAGNOSIS — E785 Hyperlipidemia, unspecified: Secondary | ICD-10-CM | POA: Insufficient documentation

## 2015-04-12 DIAGNOSIS — R5383 Other fatigue: Secondary | ICD-10-CM

## 2015-04-12 DIAGNOSIS — I739 Peripheral vascular disease, unspecified: Secondary | ICD-10-CM

## 2015-04-12 DIAGNOSIS — Z79899 Other long term (current) drug therapy: Secondary | ICD-10-CM

## 2015-04-12 DIAGNOSIS — R0989 Other specified symptoms and signs involving the circulatory and respiratory systems: Secondary | ICD-10-CM

## 2015-04-12 NOTE — Assessment & Plan Note (Signed)
History of hypertension blood pressure measured at 168/86. She is on losartan and metoprolol. Continue current meds at current dosing

## 2015-04-12 NOTE — Assessment & Plan Note (Signed)
History of nonhealing left fourth toe ulcer with severe left lower extremity claudication and recent Dopplers that suggested occluded tibials on the right with an ABI 0.88 and a high-grade distal left SFA stenosis with a left ABI 0.66. I'm going to arrange for her to undergo angiography and potential intervention for critical limb ischemia.

## 2015-04-12 NOTE — Patient Instructions (Signed)
   Dr Allyson SabalBerry has ordered: 1. Carotid Duplex- This test is an ultrasound of the carotid arteries in your neck. It looks at blood flow through these arteries that supply the brain with blood. Allow one hour for this exam. There are no restrictions or special instructions.  2. Lexiscan Myoview- this is a test that looks at the blood flow to your heart muscle.  It takes approximately 2 1/2 hours. Please follow instruction sheet, as given.   3. Dr. Allyson SabalBerry has ordered a peripheral [facility] to [facility].  This procedure is going to look at the bloodflow in your lower extremities.  If Dr. Allyson SabalBerry is able to open up the arteries, you will have to spend one night in the hospital.  If he is not able to open the arteries, you will be able to go home that same day.    After the procedure, you will not be allowed to drive for 3 days or push, pull, or lift anything greater than 10 lbs for one week.    You will be required to have the following tests prior to the procedure:  1. Blood work-the blood work can be done no more than 7 days prior to the procedure.  [facility] be [facility] at any [facility].  There is one downstairs on the first floor of this building and one [facility] ([address])  2. Chest Xray-the chest xray order has already been placed at the [facility].     *REPS Scott

## 2015-04-12 NOTE — Progress Notes (Signed)
04/12/2015 Birdena CrandallMaria Sternberg   07-04-1951  [id number]  Primary Physician Jeanann LewandowskyJEGEDE, OLUGBEMIGA, MD Primary Cardiologist: Runell GessJonathan J. Kimmi Acocella MD Roseanne RenoFACP,FACC,FAHA, FSCAI   HPI:  [name] is a 64 year old moderately overweight widowed Latino female mother of 4 children, grandmother of 20+ grandchildren was accompanied by one of her daughters today. She was referred by Dr. Dellia Nimsiegel, podiatrist, for evaluation of critical limb ischemia. Her cardiac risk factor profile is notable for treated hypertension, diabetes and hyperlipidemia. She has never had a heart attack or stroke. She does get occasional chest pressure. She's had a nonhealing ulcer on her left fourth toe last 5 months and left greater than right lower extreme claudication for the last 6 years worse recently. Lower extremity arterial Doppler studies performed 03/29/15 revealed a right ABI 0.88 with an occluded posterior tibial and dorsalis pedis, patent peroneal, left ABI 0.66 with a high-frequency signal in the distal left SFA.   Current Outpatient Prescriptions  Medication Sig Dispense Refill  . aspirin EC 81 MG EC tablet Take 1 tablet (81 mg total) by mouth daily.    Marland Kitchen. docusate sodium 100 MG CAPS Take 100 mg by mouth daily. 10 capsule 0  . FLUoxetine (PROZAC) 20 MG tablet Take 1 tablet (20 mg total) by mouth daily. 30 tablet 3  . gabapentin (NEURONTIN) 100 MG capsule Take 1 capsule (100 mg total) by mouth 3 (three) times daily. 270 capsule 3  . glimepiride (AMARYL) 4 MG tablet Take 1 tablet (4 mg total) by mouth daily with breakfast. 90 tablet 3  . ibuprofen (ADVIL,MOTRIN) 200 MG tablet Take 400 mg by mouth every 6 (six) hours as needed (pain/fever).    . insulin NPH-regular Human (NOVOLIN 70/30) (70-30) 100 UNIT/ML injection Inject 16 Units into the skin 2 (two) times daily with a meal. 30 mL 3  . Insulin Pen Needle (PEN NEEDLES 3/16") 31G X 5 MM MISC 1 Stick by Does not apply route 2 (two) times daily at 8 am and 10 pm. 100 each 12    . losartan (COZAAR) 25 MG tablet Take 1 tablet (25 mg total) by mouth daily. 90 tablet 3  . metFORMIN (GLUCOPHAGE) 1000 MG tablet Take 1 tablet (1,000 mg total) by mouth 2 (two) times daily with a meal. 180 tablet 3  . metoprolol (LOPRESSOR) 100 MG tablet Take 1 tablet (100 mg total) by mouth 2 (two) times daily. 180 tablet 3  . simvastatin (ZOCOR) 40 MG tablet Take 1 tablet (40 mg total) by mouth at bedtime. 90 tablet 3   No current facility-administered medications for this visit.    No Known Allergies  History   Social History  . Marital Status: Single    Spouse Name: N/A  . Number of Children: N/A  . Years of Education: N/A   Occupational History  . Not on file.   Social History Main Topics  . Smoking status: Never Smoker   . Smokeless tobacco: Not on file  . Alcohol Use: No  . Drug Use: No  . Sexual Activity: Not on file   Other Topics Concern  . Not on file   Social History Narrative     Review of Systems: General: negative for chills, fever, night sweats or weight changes.  Cardiovascular: negative for chest pain, dyspnea on exertion, edema, orthopnea, palpitations, paroxysmal nocturnal dyspnea or shortness of breath Dermatological: negative for rash Respiratory: negative for cough or wheezing Urologic: negative for hematuria Abdominal: negative for nausea, vomiting, diarrhea, bright red blood per rectum,  melena, or hematemesis Neurologic: negative for visual changes, syncope, or dizziness All other systems reviewed and are otherwise negative except as noted above.    Blood pressure 168/86, pulse 68, height  (1.6 m), weight 149 lb (67.586 kg).  General appearance: alert Neck: no adenopathy, no JVD, supple, symmetrical, trachea midline, thyroid not enlarged, symmetric, no tenderness/mass/nodules and soft left carotid bruit Lungs: clear to auscultation bilaterally Heart: regular rate and rhythm, S1, S2 normal, no murmur, click, rub or gallop Extremities:  extremities normal, atraumatic, no cyanosis or edema  EKG not performed today  ASSESSMENT AND PLAN:   Essential hypertension History of hypertension blood pressure measured at 168/86. She is on losartan and metoprolol. Continue current meds at current dosing   Hyperlipidemia History of hyper-lipidemia on simvastatin 40 mg a day with recent lipid profile performed 01/15/15 revealing a total cholesterol 179, LDL 94 and HDL of 35   PAD (peripheral artery disease) History of nonhealing left fourth toe ulcer with severe left lower extremity claudication and recent Dopplers that suggested occluded tibials on the right with an ABI 0.88 and a high-grade distal left SFA stenosis with a left ABI 0.66. I'm going to arrange for her to undergo angiography and potential intervention for critical limb ischemia.       Runell Gess MD FACP,FACC,FAHA, [facility] 04/12/2015 11:44 AM

## 2015-04-12 NOTE — Assessment & Plan Note (Signed)
History of hyper-lipidemia on simvastatin 40 mg a day with recent lipid profile performed 01/15/15 revealing a total cholesterol 179, LDL 94 and HDL of 35

## 2015-04-15 ENCOUNTER — Telehealth (HOSPITAL_COMMUNITY): Payer: Self-pay | Admitting: *Deleted

## 2015-04-19 ENCOUNTER — Encounter (HOSPITAL_COMMUNITY): Payer: Self-pay

## 2015-04-19 ENCOUNTER — Encounter: Payer: Self-pay | Admitting: Vascular Surgery

## 2015-04-19 ENCOUNTER — Other Ambulatory Visit (HOSPITAL_COMMUNITY): Payer: Self-pay

## 2015-04-29 ENCOUNTER — Encounter: Payer: Self-pay | Admitting: Cardiovascular Disease

## 2015-04-30 ENCOUNTER — Ambulatory Visit: Payer: Self-pay | Attending: Internal Medicine | Admitting: *Deleted

## 2015-04-30 VITALS — BP 176/76 | HR 64 | Temp 98.5°F | Resp 16

## 2015-04-30 DIAGNOSIS — Z794 Long term (current) use of insulin: Secondary | ICD-10-CM | POA: Insufficient documentation

## 2015-04-30 DIAGNOSIS — I10 Essential (primary) hypertension: Secondary | ICD-10-CM

## 2015-04-30 DIAGNOSIS — H538 Other visual disturbances: Secondary | ICD-10-CM | POA: Insufficient documentation

## 2015-04-30 DIAGNOSIS — E1165 Type 2 diabetes mellitus with hyperglycemia: Secondary | ICD-10-CM | POA: Insufficient documentation

## 2015-04-30 DIAGNOSIS — R51 Headache: Secondary | ICD-10-CM | POA: Insufficient documentation

## 2015-04-30 LAB — POCT CBG (FASTING - GLUCOSE)-MANUAL ENTRY: Glucose Fasting, POC: 193 mg/dL — AB (ref 70–99)

## 2015-04-30 MED ORDER — ASPIRIN 81 MG PO TBEC: 81.0000 mg | DELAYED_RELEASE_TABLET | Freq: Every day | ORAL | Status: DC

## 2015-04-30 MED ORDER — LOSARTAN POTASSIUM 50 MG PO TABS: 50.0000 mg | ORAL_TABLET | Freq: Every day | ORAL | Status: DC

## 2015-04-30 NOTE — Progress Notes (Signed)
Spoke with patient via [employer]-house Interpreter, NeurosurgeonBelen Patient presents with daughter for BP check, CBG and record review for T2DM Med list reviewed; patient reports taking all meds as directed except aspirin and prozac States ran out of aspirin and feels "worse" on prozac Patient's AM fasting blood sugars ranging 115-240 per patient record Patient reports several weeks ago woke at 0100 sweating, BS at 30. Had cereal with milk and went back to sleep without rechecking BS. Discussed importance of raising BS quickly (milk, juice, regular soda, hard candies, glucose tabs). Also encouraged patient/daughter to have glucose gel on hand in case in future patient is not responsive and unable to consume food/drink. Educated patient/dughter on rechecking BS after low reading to ensure BS has risen > 70 Patient states that she has been injecting evening dose of 70/30 insulin without first checking BS. Also states she has very little appetite and is only eating small meals ie bowl of cereal in evening after dosing 70/30 insulin. Discussed in detail importance of eating 3 meals daily with snacks in between. Plate Method discussed. Patient/daughter also educated on importance of checking BS before every dose of 70/30 insulin and to hold injection if BS <100  Patient denies increased thirst and urination. States this only happens when BSs are low  Patient 's AM fasting BS 217 per patient record. States she injected 16 units 70/30 insulin but did not eat CBG 193  1.5 hours after patient's insulin injection  C/o blurred vision and "slight" headache since arriving in office  Lab Results  Component Value Date   HGBA1C 7.30 02/25/2015   Filed Vitals:   04/30/15 1025  BP: 176/76  Pulse: 64  Temp: 98.5 F (36.9 C)  Resp: 16    Per PCP: Increase losartan to 50 mg daily Hold 70/30 insulin if BS < 100 4 week f/u with PCP  [facility] aspirin 81 [facility]. Patient /daughter aware that this is  also OTC Encouraged to take daily to reduce risk of heart attack and stroke due to co-morbidities  Patient advised to call for med refills at least 7 days before running out so as not to go without.  Patient given literature on DASH Eating Plan, Fat and Cholesterol Control Diet, Diabetes and Food, Diabetes and Exercise, Basic Carb Counting, Diabetes and Foot Care, Hypoglycemia, and The Plate Method

## 2015-04-30 NOTE — Patient Instructions (Signed)
Dieta para el control del colesterol y las grasas  (Fat and Cholesterol Control Diet) Los niveles de grasa y colesterol en la sangre y en los rganos se ven influidos por la dieta. Los niveles altos de grasa y colesterol pueden conducir a enfermedades del corazn, de los pequeos y los grandes vasos sanguneos, de la vescula biliar, el hgado y el pncreas.  CONTROL DE LA GRASA Y EL COLESTEROL CON LA DIETA  Aunque el ejercicio y el estilo de vida son factores importantes, su dieta es la clave. Esto se debe a que se sabe que ciertos alimentos hacen subir el colesterol y otros lo bajan. El objetivo debe ser equilibrar los alimentos, de modo que tengan un efecto sobre el colesterol y, an ms importante, reemplazar las grasas saturadas y trans con otros tipos de grasas, como las monoinsaturadas y las poliinsaturadas y cidos grasos omega-3.  En promedio, una persona no debe consumir ms de 15 a 17 g de grasas saturadas por da. Las grasas saturadas y trans se consideran grasas "malas", ya que elevan el colesterol LDL. Las grasas saturadas se encuentran principalmente en productos animales como carne, manteca y crema. Sin embargo, eso no significa que tenga que renunciar a todas sus comidas favoritas. Actualmente, hay buenos sustitutos bajos en colesterol, bajos en grasas para la mayora de las cosas que le gusta comer. Elija aquellos alimentos alternativos que sean bajos en grasas o sin grasas. Elija cortes de peceto o lomo de carne roja. Estos tipos de cortes contienen menos grasa y colesterol. Pollo (sin la piel), pescado, ternera y pechuga de pavo molida son excelentes opciones. Eliminar las carnes grasas, como las salchichas y el salame. Los mariscos contienen poca o casi nada de grasas saturadas. Consuma una porcin de 3 oz (85 g) de carne magra, aves o pescado.  Las grasas trans tambin se llaman "aceites parcialmente hidrogenados". Son aceites manipulados cientficamente de modo que son slidos a  temperatura ambiente, tienen una larga vida y mejoran el sabor y la textura de los alimentos a los que se agregan. Las grasas trans se encuentran en la margarina, masitas, crackers y alimentos horneados.  Al hornear y cocinar, el aceite es un buen sustituto de la manteca. Los aceites monoinsaturados son beneficiosos, sobre todo porque se cree que reducen el colesterol LDL y aumentan el HDL. Los aceites que hay que evitar completamente son los aceites tropicales saturados, como el de coco y palma.  Recuerde consumir una gran cantidad de alimentos de los grupos que estn naturalmente libres de grasas saturadas y grasas trans, e incluya pescado, frutas, verduras, frijoles, granos (cebada, arroz, cuscs, trigo bulgur) y pastas (sin salsas de crema).  IDENTIFICACIN DE LOS ALIMENTOS QUE REDUCEN LAS GRASAS Y ELCOLESTEROL  La fibra soluble puede reducir el colesterol. Este tipo de fibra se encuentra en las frutas como manzanas, verduras como el brcoli, papas y zanahorias, las legumbres como los frijoles, guisantes y lentejas y granos como la cebada. Los alimentos enriquecidos con esteroles vegetales (fitosteroles) tambin pueden reducir el colesterol. Consuma al menos 2 g por da de estos alimentos para un efecto de disminucin del colesterol.  Lea las etiquetas de los paquetes para identificar los alimentos bajos en grasas saturadas, en grasas trans y los bajos en grasas en el supermercado. Seleccione los quesos que tienen slo 2 a 3 g de grasa saturada por onza. Utilice margarina saludable para el corazn que sea libre de grasas trans o aceites parcialmente hidrogenados. Al comprar productos de panadera (galletas, crackers), se   deben evitar los aceites parcialmente hidrogenados. Panes y panecillos deben hacerse con cereales integrales (trigo integral o harina de avena integral en lugar de " harina " o " harina enriquecida ") Compre sopas en lata que no sean cremosas, con bajo contenido de sal y sin grasas  adicionadas.  TCNICAS DE PREPARACIN DE LOS ALIMENTOS  Nunca prepare los alimentos fritos. Si usted debe frer, Erie Insurance Groupsaltee los alimentos en muy poca grasa o use un aerosol de cocina anti adherente. Siempre que sea posible, debe hervir, hornear o asar las carnes y preparar las verduras al vapor. En lugar de agregar mantequilla o margarina a las verduras, use limn y hierbas, pur de [place] y canela (para la calabaza y la batata). Utilice yogur natural sin grasa, salsas y aderezos bajos en grasa para ensaladas.  BAJO EN GRASAS SATURADAS / SUSTITUTOS BAJOS EN GRASA  Carnes / grasas saturadas (g)  Evite: Bife, veteado (3 oz/85 g) / 11 g  Elija: Bife, magro(3 oz/85 g) / 4 g  Evite: Hamburguesa (3 oz/85 g) / 7 g  Elija: Hamburguesa, magra (3 oz/85 g) / 5 g  Evite: Jamn (3 oz/85 g) / 6 g  Elija: Jamn, corte magro (3 oz/85 g) / 2,4 g  Evite: Pollo con piel, carne oscura (3 oz/85 g) / 4 g  Elija: Pollo, sin piel, carne oscura (3 oz/85 g) / 2 g  Evite: Pollo con piel, carne blanca (3 oz/85 g) / 2,5 g  Elija: Pollo, sin piel, carne blanca (3 oz/85 g) / 1 g Lcteos / Grasa saturada (g)   Evite: Leche entera (1 taza) / 5 g  Elija: Leche descremada, 2% (1 taza) / 3 g  Elija: Leche descremada, 1% (1 taza) / 1,5 g  Elija: Leche descremada, 1 taza (0,3 g).  Evite: Queso duro (1 oz/28 g) / 6 g  Elija: Queso de PPG Industriesleche descremada (1 oz/28 g) / 2 a 3 g  Evite: Queso cottage, 4% de grasa (1 taza) / 6,5 g  Elija: Queso cottage bajo en grasa, 1% de grasa (1 taza) / 1,5 g  Evite: Helado (1 taza) / 9 g  Elija: Sorbete (1 taza) / 2,5 g  Elija: Yogur congelado descremado (1 taza) / 0,3 g  Elija: Barra de frutas congeladas / trace  Evite: Crema batida (1 cucharada) / 3,5 g  Elija: Crema batida no lctea (1 cucharada) / 1 g Condimentos / Grasas Saturadas (g)   Evite: Mayonesa (1 cucharada) / 2 g  Elija: Mayonesa baja en grasa (1 cucharada) / 1 g  Evite: Mantequilla (1 cucharada) / 7  g  Elija: Margarina light extra (1 cucharada) / 1 g  Evite: Aceite de coco (1 cucharada) / 11,8 g  Elija: Aceite de oliva (1 cucharada) / 1,8 g  Elija: Aceite de maz (1 cucharada) / 1,7 g  Elija: Aceite de crtamo (1 cucharada) / 1,2 g  Elija: Aceite de girasol (1 cucharada) / 1,4 g  Elija: Aceite de soja (1 cucharada) / 0 mg / 2,4 g  Elija: Aceite de canola (1 cucharada) / 0 mg / 1 g Document Released: 12/14/2005 Document Revised: 04/10/2013 ExitCare Patient Information 2015 [place]. This information is not intended to replace advice given to you by your health care provider. Make sure you discuss any questions you have with your health care provider. Hipoglucemia (Hypoglycemia) La hipoglucemia se produce cuando el nivel de glucosa en la sangre es demasiado bajo. La glucosa es un tipo de azcar, que es la principal  fuente de energa del cuerpo. Hormonas, como la insulina y Oncologist, Systems developer el nivel de glucosa en la [place]. La insulina reduce el nivel de la glucosa en la sangre, mientras que el glucagn lo [place]. Si tiene demasiada insulina en el torrente sanguneo o si no ingiere suficientes alimentos que contengan azcar, puede desarrollar hipoglucemia. Esta afeccin puede manifestarse en personas con o sin diabetes. Puede desarrollarse rpidamente y, como consecuencia, necesitar atencin urgente.  CAUSAS   Omitir o retrasar comidas.  No ingerir demasiados carbohidratos en las comidas.  Consumo excesivo de medicamentos para la diabetes.  No coordinar el horario de la toma de medicamentos por va oral para la diabetes o de insulina, con las comidas, las colaciones y Agricultural consultant.  Nuseas y vmitos.  Algunos medicamentos.  Enfermedades graves, como hepatitis, trastornos renales y ciertos trastornos de Psychologist, sport and exercise.  Aumento de la actividad fsica o el ejercicio, sin ingerir alimentos adicionales o ajustar los medicamentos.  Beber alcohol en exceso.  Un  trastorno nervioso que afecta las funciones corporales, como la frecuencia cardaca, presin arterial y digestin (neuropata [place]).  Una afeccin en la cual los msculos del estmago no funcionan apropiadamente (gastroparesia). Por consiguiente, los medicamentos y los alimentos no pueden absorberse Merchandiser, retail.  Pocas veces un tumor de pncreas puede producir demasiada insulina. SNTOMAS   Hambre.  Sudoraciones (diaforesis).  Cambio en la Arts development officer.  Temblores.  Dolor de [place].  Ansiedad.  Aturdimiento.  Irritabilidad.  Dificultad para concentrarse.  Sequedad en la boca.  Hormigueo o adormecimiento de las manos y los pies.  Sueo agitado o alteraciones del sueo.  Alteracin en el habla y la coordinacin.  Cambio en el estado mental.  Convulsiones breves o prolongadas.  Agresividad  Somnolencia (letargo).  Debilidad.  Aumento de la frecuencia cardaca o palpitaciones.  Confusin.  Piel plida o de [employer].  Visin borrosa o doble.  Desmayos. DIAGNSTICO  Le harn un examen fsico y [place] historia clnica. Su mdico puede hacer un diagnstico en funcin de sus sntomas. Pueden realizarle anlisis de sangre y otras pruebas de laboratorio para Pharmacist, hospital diagnstico. Una vez realizado el diagnstico, su mdico observar si los signos y sntomas desaparecen, una vez que aumenta el nivel de la glucosa en la [place].  TRATAMIENTO  Por lo general, la hipoglucemia puede tratarse fcilmente cuando se observan sntomas.  Controle su nivel de glucosa en la sangre. Si es menor que 70 mg/dl, tome uno de los siguientes:  3 o 4 comprimidos de glucosa.   taza de jugo.   taza de una gaseosa comn.  1 taza de [employer]PG [employer].   a 1 pomo de glucosa en gel.  5 a 6 caramelos duros.  Evite las bebidas o los alimentos con alto contenido de grasa, que pueden retrasar el aumento de los niveles de glucosa en la [place].  No ingiera ms de la  cantidad recomendada de alimentos, bebidas, gel o comprimidos que contengan azcar. Si lo hace, el nivel de glucosa en la sangre subir demasiado.  Espere de 10 a 15 minutos y vuelva a Chief Operating Officer su nivel de glucosa en la sangre. Si an es Scientist, water quality 70 mg/dl o est por debajo del intervalo indicado, repita el tratamiento.  Ingiera una colacin si falta ms de 1 hora para su prxima comida. Es posible que, [address], su nivel de glucosa en la sangre baje [place], de modo que no pueda tratarse en su casa, cuando comience a observar los sntomas. Probablemente necesite ayuda. Incluso  puede desmayarse o ser incapaz de tragar. Si no puede tratarse por s solo, alguien Customer service manager al hospital.  INSTRUCCIONES PARA EL CUIDADO EN EL HOGAR  Si tiene diabetes, siga su plan de control de la diabetes:  Tome los medicamentos segn las indicaciones.  Siga el plan de ejercicio.  Siga el plan de comidas. No saltee comidas. Coma a horario.  Controle su nivel de glucosa en la sangre peridicamente. Controle su nivel de glucosa en la sangre antes y despus de ejercitarse. Si hace ejercicio durante ms tiempo o de [employer] de lo habitual, asegrese de Chief Operating Officer su nivel de glucosa en la sangre con mayor frecuencia.  Use su pulsera o medalla de alerta mdica, que indica que usted tiene diabetes.  Identifique la causa de su hipoglucemia. Luego, desarrolle formas de prevenir la recurrencia de la hipoglucemia.  No tome un bao o una ducha caliente inmediatamente despus de una inyeccin de insulina.  Siempre lleve [employer] Devices. Las pastillas de glucosa son fciles de Midwife.  Si va a beber alcohol, bbalo solo con las comidas.  Informe a familiares y [place] qu pueden hacer para mantenerlo seguro durante una convulsin. Esto puede incluir retirar TEPP[employer] Partners duros o filosos del rea o colocarlo de costado.  Mantenga un peso saludable. SOLICITE ATENCIN MDICA SI:   Tiene problemas para  Futures trader de glucosa en la sangre dentro del intervalo indicado.  Tiene episodios frecuentes de hipoglucemia.  Siente efectos secundarios por los medicamentos prescritos.  No est seguro por qu su nivel de glucosa en la sangre es tan bajo.  Nota cambios o un nuevo problema en la visin . SOLICITE ATENCIN MDICA DE INMEDIATO SI:   Presenta confusin.  Se produce un cambio en su estado mental.  Es incapaz de tragar.  Se desmaya. Document Released: 12/14/2005 Document Revised: 12/19/2013 [facility] Patient Information 2015 [place], [place]. This information is not intended to replace advice given to you by your health care provider. Make sure you discuss any questions you have with your health care provider. Diabetes y cuidados del pie (Diabetes and Foot Care) La diabetes puede ser la causa de que el flujo sanguneo (circulacin) en las piernas y los pies sea deficiente. Debido a esto, la piel de los pies se torna ms delgada, se rompe con facilidad y se cura ms lentamente. La piel puede estar seca, despellejarse y Lobbyist. Tambin pueden estar daados los nervios de las piernas y de los pies lo que provoca una disminucin de la sensibilidad. Es posible que no advierta heridas ms pequeas en los pies, que pueden causar infecciones graves. Cuidar sus pies es una de las cosas ms importantes que puede hacer por usted mismo.  INSTRUCCIONES PARA EL CUIDADO EN EL HOGAR  Use siempre calzado, an dentro de su casa. No camine descalzo. Caminar descalzo facilita que se lastime.  Controle sus pies diariamente para observar ampollas, cortes y enrojecimiento. Si no puede ver la planta del pie, use un espejo o pdale ayuda a Engineer, maintenance (IT).  Lave sus pies con agua tibia (no use agua caliente) y un [place]. Seque bien sus pies, y la zona [employer] dedos dando [place], hasta que estn completamente secos. Noremoje los pies, ya que esto puede resecar la piel.  Aplique una locin  hidratante o vaselina (que no contenga alcohol ni perfume) en los pies y en las uas secas y [place]. No aplique locin entre los dedos.  Recorte las uas en forma recta. No escarbe debajo de las  uas o alrededor [employer]. Lime los bordes de las uas con una lima o esmeril.  No corte las durezas o callosidades, ni trate de quitarlas con medicamentos.  Use calcetines de algodn o medias [employer] [place] [employer]. Asegrese de que no le P[employer] Inc. Nouse calcetines que le lleguen a las rodillas, ya que podran disminuir el flujo de sangre a las piernas.  Use zapatos de cuero que le queden bien y que sean acolchados. Para amoldar los zapatos, clcelos slo algunas horas por da. Esto evitar lesiones en los pies. Revise siempre los zapatos antes de ponerlos para asegurarse de que no haya objetos en su interior.  No cruce las piernas. Esto puede disminuir el flujo de sangre a los pies.  Si algo le ha raspado, cortado o lastimado la piel de los pies, mantenga la piel de esa zona limpia y [place]. Debe higienizar estas zonas con agua y un jabn suave. No limpie la zona con agua oxigenada, alcohol ni yodo.  Cuando se quite un vendaje adhesivo, asegrese de no daar la piel.  Si tiene una herida, obsrvela varias veces por da para asegurarse de que se est curando.  No use bolsas de agua caliente ni almohadillas trmicas. Podran causar quemaduras. Si ha perdido la sensibilidad en los pies o las piernas, no sabr lo que le est sucediendo hasta que sea demasiado tarde.  Asegrese de que su mdico le haga un examen completo de los pies por lo menos una vez al ao, o con ms frecuencia si usted tiene Care[employer] Rx. Informe todos los cortes, llagas o moretones a su mdico inmediatamente. SOLICITE ATENCIN MDICA SI:   Tiene una lesin que no se cura.  Tiene cortes o rajaduras en la piel.  Tiene una ua encarnada.  Nota una zona irritada en las piernas o los pies.  Siente una  sensacin de ardor u hormigueo en las piernas o los pies.  Siente dolor o calambres en las piernas o los pies.  Las piernas o los pies estn adormecidos.  Siente los pies siempre fros. SOLICITE ATENCIN MDICA DE INMEDIATO SI:   Presenta enrojecimiento, hinchazn o aumento del dolor en una herida.  Nota una lnea roja que sube por pierna.  Aparece pus en la herida.  Le sube la fiebre o segn lo que le indique el mdico.  Advierte un olor ftido que proviene de una lcera o una herida. Document Released: 12/14/2005 Document Revised: 08/16/2013 [facility] Patient Information 2015 [place], [place]. This information is not intended to replace advice given to you by your health care provider. Make sure you discuss any questions you have with your health care provider. La diabetes mellitus y los alimentos (Diabetes Mellitus and Food) Es importante que controle su nivel de azcar en la sangre (glucosa). El nivel de glucosa en sangre depende en gran medida de lo que usted come. Comer alimentos saludables en las cantidades [place] a lo largo del Futures trader, aproximadamente a la misma hora [employer], lo ayudar a Chief Operating Officer su nivel de Event organiser. Tambin puede ayudarlo a retrasar o Fish farm manager de la diabetes mellitus. Comer de [employer] saludable incluso puede ayudarlo a Event organiser de presin arterial y a Barista o Pharmacologist un peso saludable.  CMO PUEDEN AFECTARME LOS ALIMENTOS? Carbohidratos Los carbohidratos afectan el nivel de glucosa en sangre ms que cualquier otro tipo de alimento. El nutricionista lo ayudar a Chief Strategy Officer cuntos carbohidratos puede consumir en cada comida y ensearle a contarlos. El  recuento de carbohidratos es importante para mantener la glucosa en sangre en un nivel saludable, en especial si utiliza insulina o toma determinados medicamentos para la diabetes mellitus. Alcohol El alcohol puede provocar disminuciones sbitas de la glucosa en sangre  (hipoglucemia), en especial si utiliza insulina o toma determinados medicamentos para la diabetes mellitus. La hipoglucemia es una afeccin que puede poner en peligro la vida. Los sntomas de la hipoglucemia (somnolencia, mareos y Administrator) son similares a los sntomas de haber consumido mucho alcohol.  Si el mdico lo autoriza a beber alcohol, hgalo con moderacin y siga estas pautas:  Las mujeres no deben beber ms de un trago por da, y los hombres no deben beber ms de dos tragos por Futures trader. Un trago es igual a:  12 onzas (355 ml) de cerveza  5 onzas de vino (150 ml) de vino  1,5onzas (45ml) de bebidas espirituosas  No beba con el estmago vaco.  Mantngase hidratado. Beba agua, gaseosas dietticas o t helado sin azcar.  Las gaseosas comunes, los jugos y otros refrescos podran contener muchos carbohidratos y se Heritage manager. QU ALIMENTOS NO SE RECOMIENDAN? Cuando haga las elecciones de alimentos, es importante que recuerde que todos los alimentos son distintos. Algunos tienen menos nutrientes que otros por porcin, aunque podran tener la misma cantidad de caloras o carbohidratos. Es difcil darle al cuerpo lo que necesita cuando consume alimentos con menos nutrientes. Estos son algunos ejemplos de alimentos que debera evitar ya que contienen muchas caloras y carbohidratos, pero pocos nutrientes:  Neurosurgeon trans (la mayora de los alimentos procesados incluyen grasas trans en la etiqueta de Informacin nutricional).  Gaseosas comunes.  Jugos.  Caramelos.  Dulces, como tortas, pasteles, rosquillas y [place].  Comidas fritas. QU ALIMENTOS PUEDO COMER? Consuma alimentos ricos en nutrientes, que nutrirn el cuerpo y lo mantendrn saludable. Los alimentos que debe comer tambin dependern de varios factores, como:  Las caloras que necesita.  Los medicamentos que toma.  Su peso.  El nivel de glucosa en [place].  El Spiro de presin arterial.  El nivel de  colesterol. Tambin debe consumir una variedad de [place], como:  Protenas, como carne, aves, pescado, tofu, frutos secos y semillas (las protenas de [place] magros son mejores).  Nils P[place].  Verduras.  Productos lcteos, como [place], queso y yogur (descremados son mejores).  Panes, granos, pastas, cereales, arroz y frijoles.  Grasas, como aceite de [place], [place] sin grasas trans, aceite de canola, aguacate y [place]. TODOS LOS QUE PADECEN DIABETES MELLITUS TIENEN EL MISMO PLAN DE COMIDAS? Dado que todas las personas que padecen diabetes mellitus son distintas, no hay un solo plan de comidas que funcione para todos. Es muy importante que se rena con un nutricionista que lo ayudar a crear un plan de comidas adecuado para usted. Document Released: 03/22/2008 Document Revised: 12/19/2013 [facility] Patient Information 2015 [place], [place]. This information is not intended to replace advice given to you by your health care provider. Make sure you discuss any questions you have with your health care provider. Diabetes y [place]o [place] fsica (Diabetes and Exercise) Hacer actividad fsica con regularidad es muy importante. No se trata solo de [employer]. Tiene muchos otros beneficios, como por ejemplo:  Mejorar el estado fsico, la flexibilidad y la resistencia.  Aumenta la densidad sea.  Ayuda a Art gallery manager.  Disminuye la Art gallery manager.  Aumenta la fuerza muscular.  Reduce el estrs y las tensiones.  Mejora el estado de salud general. Las personas diabticas que realizan actividad fsica tienen  beneficios adicionales debido al ejercicio:  Reduce el apetito.  El organismo mejora el uso del azcar (glucosa) de la [place].  Ayuda a disminuir o Engineer, maintenance (IT).  Disminuye la presin arterial.  Ayuda a disminuir los lpidos en la sangre (colesterol y triglicridos).  El organismo mejora el uso de la insulina porque:  Aumenta la sensibilidad del  organismo a la insulina.  Reduce las necesidades de insulina del organismo.  Disminuye el riesgo de enfermedad cardaca por la actividad fsica ya que  disminuye el colesterol y [employer] Partners triglicridos.  Aumenta los niveles de colesterol bueno (como las lipoprotenas de alta densidad [HDL]) en el organismo.  Disminuye los niveles de glucosa en la [place]. SU PLAN DE ACTIVIDAD  Elija una actividad que disfrute y establezca objetivos realistas. Su mdico o educador en diabetes podrn ayudarlo a encontrar una actividad que lo beneficie. Haga ejercicio regularmente como se lo haya indicado el mdico. Esto incluye:  Hacer entrenamiento de Northrop Grumman a la semana, como flexiones, sentadillas, levantar peso o usar bandas de resistencia.  Practicar de ejercicios cardiovasculares cada semana, como caminar, correr o hacer algn deporte.  Mantenerse activo y no permanecer inactivo durante ms de seguidos. Los perodos cortos de [place] tambin son beneficiosos. Tres sesiones de a lo largo del da son tan beneficiosas como una sola sesin de . Estas son algunas ideas para los ejercicios:  Lleve a Multimedia programmer.  Utilice las Microbiologist del ascensor.  Baile su cancin favorita.  Haga los ejercicios de un video de ejercicios.  Haga sus ejercicios favoritos con Leisure centre manager. RECOMENDACIONES PARA REALIZAR EJERCICIOS CUANDO SE TIENE DIABETES TIPO 1 O TIPO 2   Controle la glucosa en la sangre antes de comenzar. Si el nivel de glucosa en la sangre es de ms de 240 mg/dl, controle las cetonas en la [place]. No haga actividad fsica si hay cetonas.  Evite inyectarse insulina en las zonas del cuerpo que ejercitar. Por ejemplo, evite inyectarse insulina en:  Los brazos, si juega al tenis.  Las piernas, si corre.  Lleve un registro de:  Los alimentos que consume antes y despus de Tour manager.  Los momentos esperables de picos de  accin de la insulina.  Los niveles de glucosa en la sangre antes y despus de hacer ejercicios.  El tipo y cantidad de [place] fsica que Biomedical engineer.  Revise los registros con su mdico. El mdico lo ayudar a Environmental education officer pautas para ajustar la cantidad de alimento y las cantidades de insulina antes y despus de Radio producer ejercicios.  Si toma insulina o agentes hipoglucemiantes por va oral, observe si hay signos y sntomas de hipoglucemia. Entre los que se incluyen:  Mareos.  Temblores.  Sudoracin.  Escalofros.  Confusin.  Beba gran cantidad de agua mientras hace ejercicios para evitar la deshidratacin o los golpes de Airline pilot. Durante la actividad fsica se pierde agua corporal que se debe reponer.  Comente con su mdico antes de comenzar un programa de actividad fsica para verificar que sea seguro para usted. Recuerde, cualquier actividad es mejor que ninguna. Document Released: 01/03/2008 Document Revised: 04/30/2014 [facility] Patient Information 2015 [place], [place]. This information is not intended to replace advice given to you by your health care provider. Make sure you discuss any questions you have with your health care provider. Recuento bsico de carbohidratos para la diabetes mellitus (Basic Carbohydrate Counting for Diabetes Mellitus) El recuento de carbohidratos es un mtodo destinado a calcular  la cantidad de carbohidratos en la dieta. El consumo de carbohidratos aumenta naturalmente el nivel de azcar (glucosa) en la sangre, por lo que es importante que sepa la cantidad que debe incluir en cada comida. El recuento de carbohidratos ayuda a Futures trader de glucosa en la sangre dentro de los lmites normales. La cantidad permitida de carbohidratos es diferente para cada persona. Un nutricionista puede ayudarlo a calcular la cantidad adecuada para usted. Una vez que sepa la cantidad de carbohidratos que puede consumir, podr calcular los carbohidratos de los alimentos que  desea comer. Los siguientes alimentos incluyen carbohidratos:  Granos, como panes y cereales.  Frijoles secos y productos con soja.  Vegetales almidonados, como papas, guisantes y maz.  Nils Pyle y jugos de frutas.  Leche y Dentist.  Dulces y bocadillos, como pastel, galletas, caramelos, papas fritas de bolsa, refrescos y bebidas frutales con azcar. RECUENTO DE CARBOHIDRATOS [employer] de calcular los carbohidratos de los alimentos. Puede usar cualquiera de [address] o [place] combinacin de [place]. Leer la etiqueta de informacin nutricional de los alimentos envasados La informacin nutricional es una etiqueta incluida en casi todas las bebidas y los alimentos envasados de los [place]. Indica el tamao de la porcin de ese alimento o bebida e informacin sobre los nutrientes de cada porcin, incluso los gramos (g) de carbohidratos por porcin.  Decida la cantidad de porciones que comer o tomar de este alimento o bebida. Multiplique la cantidad de porciones por el nmero de gramos de carbohidratos indicados en la etiqueta para esa porcin. El total ser la cantidad de carbohidratos que consumir al comer ese alimento o tomar esa bebida. Conocer las porciones estndar de los alimentos Cuando coma alimentos no envasados o que no incluyan la informacin nutricional en la etiqueta, deber medir las porciones para poder calcular la cantidad de carbohidratos. Una porcin de la mayora de los alimentos ricos en carbohidratos contiene alrededor de 15g de carbohidratos. La siguiente [employer] tamaos de porcin de los alimentos ricos en carbohidratos que contienen alrededor de 15g de carbohidratos por porcin:   1rebanada de pan (1oz) o 1tortilla de seis pulgadas.  panecillo de hamburguesa o bollito tipo ingls.  4a 6galletas.   de taza de cereal sin azcar y seco.   taza de cereal caliente.   de taza de arroz o pastas.  taza de pur de papas o de una papa  grande al horno.  1taza de frutas frescas o una fruta pequea.  taza de frutas o jugo de frutas enlatados o congelados.  1 taza [employer].   de taza de yogur descremado sin ningn agregado o de yogur endulzado con edulcorante artificial.  taza de vegetales almidonados, como guisantes, maz o papas, o de frijoles secos cocidos. Decida la cantidad de porciones Advertising copywriter. Multiplique la cantidad de porciones por 15 (los gramos de carbohidratos en esa porcin). Por ejemplo, si come 2tazas de fresas, habr comido 2porciones y 30g de carbohidratos (2porciones x 15g = 30g). Para las comidas como sopas y guisos, en las que se mezcla ms de un alimento, deber [employer] carbohidratos de cada alimento incluido. EJEMPLO DE RECUENTO DE CARBOHIDRATOS Ejemplo de cena  3 onzas de pechugas de pollo.   de taza de arroz integral.   taza de maz.  1 taza de [place].  1 taza de fresas con crema batida sin azcar. Clculo de carbohidratos [place] 1: Identifique los alimentos que contienen carbohidratos:   Arroz.  Maz.  Leche.  Jinny Sanders. [place] 2: Calcule el nmero de porciones que consumir de cada uno:   2 porciones de Surveyor, minerals.  1 porcin de maz.  1 porcin de leche.  1 porcin de fresas. [place] 3: Multiplique cada una de esas porciones por 15g:   2 porciones de arroz x 15 g = 30 g.  1 porcin de maz x 15 g = 15 g.  1 porcin de leche x 15 g = 15 g.  1 porcin de fresas x 15 g = 15 g. [place] 4: Sume todas las cantidades para Artist total de gramos de carbohidratos consumidos: 30 g + 15 g + 15 g + 15 g = 75 g. Document Released: 03/07/2012 Document Revised: 04/30/2014 [facility] Patient Information 2015 [place], [place]. This information is not intended to replace advice given to you by your health care provider. Make sure you discuss any questions you have with your health care provider. Plan de alimentacin DASH (DASH Eating Plan) DASH es la sigla en ingls de  "Enfoques Alimentarios para Detener la Hipertensin". El plan de alimentacin DASH ha demostrado bajar la presin arterial elevada (hipertensin). Los beneficios adicionales para la salud pueden incluir la disminucin del riesgo de diabetes mellitus tipo2, enfermedades cardacas e ictus. Este plan tambin puede ayudar a Geophysical data processor. QU DEBO SABER ACERCA DEL PLAN DE ALIMENTACIN DASH? Para el plan de alimentacin DASH, seguir las siguientes pautas generales:  Elija los alimentos con un valor porcentual diario de sodio de menos del 5% (segn figura en la etiqueta del alimento).  Use hierbas o aderezos sin sal, en lugar de sal de mesa o sal marina.  Consulte al mdico o farmacutico antes de usar sustitutos de la sal.  Coma productos con bajo contenido de sodio, cuya etiqueta suele decir "bajo contenido de sodio" o "sin agregado de sal".  Coma alimentos frescos.  Coma ms verduras, frutas y productos lcteos con bajo contenido de [place].  Elija los cereales integrales. Busque la palabra "integral" en Estate agent de la lista de ingredientes.  Elija el pescado y el pollo o el pavo sin piel ms a menudo que las carnes rojas. Limite el consumo de pescado, carne de ave y carne a 6onzas (170g) por Futures trader.  Limite el consumo de dulces, postres, azcares y bebidas azucaradas.  Elija las grasas saludables para el corazn.  Limite el consumo de queso a 1onza (28g) por Futures trader.  Consuma ms comida casera y menos de restaurante, de buf y comida rpida.  Limite el consumo de alimentos fritos.  Cocine los alimentos utilizando mtodos que no sean la fritura.  Limite las verduras enlatadas. Si las consume, enjuguelas bien para disminuir el sodio.  Cuando coma en un restaurante, pida que preparen su comida con menos sal o, en lo posible, sin nada de sal. QU ALIMENTOS PUEDO COMER? Pida ayuda a un nutricionista para conocer las necesidades calricas individuales. Cereales Pan de salvado o  integral. Arroz integral. Pastas de salvado o integrales. Quinua, trigo burgol y cereales integrales. Cereales con bajo contenido de sodio. Tortillas de harina de maz o de salvado. Pan de maz integral. Galletas saladas integrales. Galletas con bajo contenido de [place]. Vegetales Verduras frescas o congeladas (crudas, al vapor, asadas o grilladas). Jugos de tomate y verduras con contenido bajo o reducido de sodio. Pasta y salsa de tomate con contenido bajo o reducido de sodio. Verduras enlatadas con bajo contenido de sodio o reducido de sodio.  Nils Pyle Nils Pyle frescas, en conserva (en su jugo natural) o frutas congeladas.  Carnes y otros productos con protenas Carne de res molida (al 85% o ms [place]), carne de res de animales alimentados con pastos o carne de res sin la grasa. Pollo o pavo sin piel. Carne de pollo o de [place]. Cerdo sin la grasa. Todos los pescados y frutos de mar. Huevos. Porotos, guisantes o lentejas secos. Frutos secos y semillas sin sal. Frijoles enlatados sin sal. Lcteos Productos lcteos con bajo contenido de grasas, como [place] o al 1%, quesos reducidos en grasas o al 2%, ricota con bajo contenido de grasas o [employer], o yogur natural con bajo contenido de [place]. Quesos con contenido bajo o reducido de sodio. Grasas y Writer en barra que no contengan grasas trans. [place] y alios para ensaladas livianos o reducidos en grasas (reducidos en sodio). [place]. Aceites de crtamo, oliva o canola. Mantequilla natural de man o almendra. Otros Palomitas de maz y pretzels sin sal. Los artculos mencionados arriba pueden no ser [employer]on de las bebidas o los alimentos recomendados. Comunquese con el nutricionista para conocer ms opciones. QU ALIMENTOS NO SE RECOMIENDAN? Cereales Pan blanco. Pastas blancas. Arroz blanco. Pan de maz refinado. Bagels y croissants. Galletas saladas que contengan grasas trans. Vegetales Vegetales con  crema o fritos. Verduras en salsa de [place]. Verduras enlatadas comunes. Pasta y salsa de tomate en lata comunes. Jugos comunes de tomate y de verduras. Nils Pyle Frutas secas. Fruta enlatada en almbar liviano o espeso. Jugo de frutas. Carnes y otros productos con protenas [place] de carne con Holiday representative. Costillas, alas de pollo, tocineta, salchicha, mortadela, salame, chinchulines, tocino, perros calientes, salchichas alemanas y embutidos envasados. Frutos secos y semillas con sal. Frijoles con sal en lata. Lcteos Leche entera o al 2%, crema, mezcla de [place] y crema, y queso crema. Yogur entero o endulzado. Quesos o queso azul con alto contenido de Neurosurgeon. Cremas no lcteas y coberturas batidas. Quesos procesados, quesos para untar o cuajadas. Condimentos Sal de cebolla y ajo, sal condimentada, sal de mesa y sal marina. Salsas en lata y envasadas. Salsa [place]. Salsa trtara. Salsa barbacoa. Salsa teriyaki. Salsa de soja, incluso la que tiene contenido reducido de [employer]. Salsa de carne. Salsa de pescado. Salsa de [place]. Salsa rosada. Rbano picante. Ketchup y mostaza. Saborizantes y tiernizantes para carne. Caldo en cubitos. Salsa picante. Salsa tabasco. Adobos. Aderezos para tacos. Salsas. Grasas y [address], [place] en barra, Arnold de [place], [place], [place] clarificada y Steffanie [place] de tocino. Aceites de coco, de palmiste o de palma. Aderezos comunes para ensalada. Otros Pickles y [place]. Palomitas de maz y pretzels con sal. Los artculos mencionados arriba pueden no ser [employer]theon de las bebidas y los alimentos que se Theatre stage manager. Comunquese con el nutricionista para obtener ms informacin. DNDE Raelyn Mora MS INFORMACIN? Instituto Nacional del Anderson, del Pulmn y de la Sangre (National Heart, Lung, and Blood Institute): [email or web address] Document Released: 12/03/2011 Document Revised: 04/30/2014 [facility] Patient  Information 2015 [place], [place]. This information is not intended to replace advice given to you by your health care provider. Make sure you discuss any questions you have with your health care provider.

## 2015-05-03 ENCOUNTER — Telehealth (HOSPITAL_COMMUNITY): Payer: Self-pay

## 2015-05-03 NOTE — Telephone Encounter (Signed)
Encounter complete. 

## 2015-05-07 ENCOUNTER — Ambulatory Visit (HOSPITAL_BASED_OUTPATIENT_CLINIC_OR_DEPARTMENT_OTHER)
Admission: RE | Admit: 2015-05-07 | Discharge: 2015-05-07 | Disposition: A | Discharge status: Home / Self Care | Payer: Self-pay | Source: Ambulatory Visit | Attending: Cardiology | Admitting: Cardiology

## 2015-05-07 ENCOUNTER — Ambulatory Visit (HOSPITAL_COMMUNITY)
Admission: RE | Admit: 2015-05-07 | Discharge: 2015-05-07 | Disposition: A | Discharge status: Home / Self Care | Payer: Self-pay | Source: Ambulatory Visit | Attending: Cardiology | Admitting: Cardiology

## 2015-05-07 DIAGNOSIS — R079 Chest pain, unspecified: Secondary | ICD-10-CM | POA: Insufficient documentation

## 2015-05-07 DIAGNOSIS — I6521 Occlusion and stenosis of right carotid artery: Secondary | ICD-10-CM | POA: Insufficient documentation

## 2015-05-07 DIAGNOSIS — R0989 Other specified symptoms and signs involving the circulatory and respiratory systems: Secondary | ICD-10-CM

## 2015-05-07 MED ORDER — TECHNETIUM TC 99M SESTAMIBI GENERIC - CARDIOLITE
10.1000 | Freq: Once | INTRAVENOUS | Status: AC | PRN
Start: 2015-05-07 — End: 2015-05-07
  Administered 2015-05-07: 10.1 via INTRAVENOUS

## 2015-05-07 MED ORDER — REGADENOSON 0.4 MG/5ML IV SOLN
0.4000 mg | Freq: Once | INTRAVENOUS | Status: AC
  Administered 2015-05-07: 0.4 mg via INTRAVENOUS

## 2015-05-07 MED ORDER — TECHNETIUM TC 99M SESTAMIBI GENERIC - CARDIOLITE
31.7000 | Freq: Once | INTRAVENOUS | Status: AC | PRN
  Administered 2015-05-07: 31.7 via INTRAVENOUS

## 2015-05-07 MED ORDER — AMINOPHYLLINE 25 MG/ML IV SOLN
75.0000 mg | Freq: Once | INTRAVENOUS | Status: AC
  Administered 2015-05-07: 75 mg via INTRAVENOUS

## 2015-05-08 LAB — MYOCARDIAL PERFUSION IMAGING
CHL CUP NUCLEAR SDS: 0
CHL CUP NUCLEAR SRS: 1
CHL CUP STRESS STAGE 1 GRADE: 0 %
CHL CUP STRESS STAGE 2 GRADE: 0 %
CHL CUP STRESS STAGE 2 HR: 62 {beats}/min
CHL CUP STRESS STAGE 2 SPEED: 0 mph
CHL CUP STRESS STAGE 3 HR: 82 {beats}/min
CHL CUP STRESS STAGE 4 HR: 71 {beats}/min
CHL CUP STRESS STAGE 4 SBP: 171 mmHg
CHL CUP STRESS STAGE 4 SPEED: 0 mph
CSEPPHR: 82 {beats}/min
CSEPPMHR: 51 %
Estimated workload: 1 METS
LV dias vol: 55 mL
LV sys vol: 17 mL
Nuc Stress EF: 68 %
Rest HR: 64 {beats}/min
SSS: 1
Stage 1 DBP: 94 mmHg
Stage 1 HR: 62 {beats}/min
Stage 1 SBP: 154 mmHg
Stage 1 Speed: 0 mph
Stage 3 Grade: 0 %
Stage 3 Speed: 0 mph
Stage 4 DBP: 91 mmHg
Stage 4 Grade: 0 %
TID: 1.08

## 2015-05-09 ENCOUNTER — Encounter (HOSPITAL_COMMUNITY): Payer: Self-pay | Admitting: *Deleted

## 2015-05-16 ENCOUNTER — Encounter (HOSPITAL_COMMUNITY)
Admission: RE | Disposition: A | Discharge status: Home / Self Care | Payer: Self-pay | Source: Ambulatory Visit | Attending: Cardiovascular Disease

## 2015-05-16 ENCOUNTER — Encounter (HOSPITAL_COMMUNITY): Payer: Self-pay | Admitting: Cardiovascular Disease

## 2015-05-16 ENCOUNTER — Encounter (HOSPITAL_COMMUNITY): Payer: Self-pay

## 2015-05-16 ENCOUNTER — Ambulatory Visit (HOSPITAL_COMMUNITY)
Admission: RE | Admit: 2015-05-16 | Discharge: 2015-05-17 | Disposition: A | Discharge status: Home / Self Care | Payer: Self-pay | Source: Ambulatory Visit | Attending: Cardiovascular Disease | Admitting: Cardiovascular Disease

## 2015-05-16 ENCOUNTER — Ambulatory Visit (HOSPITAL_COMMUNITY): Admit: 2015-05-16 | Payer: MEDICAID | Admitting: Cardiovascular Disease

## 2015-05-16 DIAGNOSIS — I10 Essential (primary) hypertension: Secondary | ICD-10-CM | POA: Insufficient documentation

## 2015-05-16 DIAGNOSIS — I70212 Atherosclerosis of native arteries of extremities with intermittent claudication, left leg: Secondary | ICD-10-CM

## 2015-05-16 DIAGNOSIS — E119 Type 2 diabetes mellitus without complications: Secondary | ICD-10-CM | POA: Insufficient documentation

## 2015-05-16 DIAGNOSIS — I739 Peripheral vascular disease, unspecified: Secondary | ICD-10-CM | POA: Diagnosis present

## 2015-05-16 DIAGNOSIS — L97529 Non-pressure chronic ulcer of other part of left foot with unspecified severity: Secondary | ICD-10-CM | POA: Insufficient documentation

## 2015-05-16 DIAGNOSIS — Z794 Long term (current) use of insulin: Secondary | ICD-10-CM | POA: Insufficient documentation

## 2015-05-16 DIAGNOSIS — I70245 Atherosclerosis of native arteries of left leg with ulceration of other part of foot: Secondary | ICD-10-CM | POA: Insufficient documentation

## 2015-05-16 DIAGNOSIS — Z7982 Long term (current) use of aspirin: Secondary | ICD-10-CM | POA: Insufficient documentation

## 2015-05-16 DIAGNOSIS — I70229 Atherosclerosis of native arteries of extremities with rest pain, unspecified extremity: Secondary | ICD-10-CM | POA: Diagnosis present

## 2015-05-16 DIAGNOSIS — E1165 Type 2 diabetes mellitus with hyperglycemia: Secondary | ICD-10-CM

## 2015-05-16 DIAGNOSIS — IMO0002 Reserved for concepts with insufficient information to code with codable children: Secondary | ICD-10-CM

## 2015-05-16 DIAGNOSIS — E785 Hyperlipidemia, unspecified: Secondary | ICD-10-CM | POA: Insufficient documentation

## 2015-05-16 DIAGNOSIS — I998 Other disorder of circulatory system: Secondary | ICD-10-CM | POA: Diagnosis present

## 2015-05-16 HISTORY — DX: Peripheral vascular disease, unspecified: I73.9

## 2015-05-16 HISTORY — DX: Type 2 diabetes mellitus without complications: E11.9

## 2015-05-16 HISTORY — PX: ABDOMINAL ANGIOGRAM: SHX5499

## 2015-05-16 HISTORY — PX: PERIPHERAL VASCULAR CATHETERIZATION: SHX172C

## 2015-05-16 LAB — CBC
HCT: 39.1 % (ref 36.0–46.0)
HEMOGLOBIN: 14 g/dL (ref 12.0–15.0)
MCH: 32.3 pg (ref 26.0–34.0)
MCHC: 35.8 g/dL (ref 30.0–36.0)
MCV: 90.3 fL (ref 78.0–100.0)
PLATELETS: 326 10*3/uL (ref 150–400)
RBC: 4.33 MIL/uL (ref 3.87–5.11)
RDW: 12.8 % (ref 11.5–15.5)
WBC: 7.6 10*3/uL (ref 4.0–10.5)

## 2015-05-16 LAB — POCT ACTIVATED CLOTTING TIME
ACTIVATED CLOTTING TIME: 183 s
ACTIVATED CLOTTING TIME: 257 s
ACTIVATED CLOTTING TIME: 183 s
Activated Clotting Time: 337 seconds
Activated Clotting Time: 208 seconds
Activated Clotting Time: 165 seconds

## 2015-05-16 LAB — BASIC METABOLIC PANEL
ANION GAP: 10 (ref 5–15)
BUN: 14 mg/dL (ref 6–20)
CALCIUM: 9.4 mg/dL (ref 8.9–10.3)
CHLORIDE: 105 mmol/L (ref 101–111)
CO2: 24 mmol/L (ref 22–32)
Creatinine, Ser: 0.99 mg/dL (ref 0.44–1.00)
GFR calc Af Amer: >60 mL/min (ref >60)
GFR calc non Af Amer: >60 mL/min (ref >60)
Glucose, Bld: 197 mg/dL — ABNORMAL HIGH (ref 65–99)
Potassium: 4.3 mmol/L (ref 3.5–5.1)
Sodium: 139 mmol/L (ref 135–145)

## 2015-05-16 LAB — GLUCOSE, CAPILLARY
Glucose-Capillary: 181 mg/dL — ABNORMAL HIGH (ref 65–99)
Glucose-Capillary: 208 mg/dL — ABNORMAL HIGH (ref 65–99)
Glucose-Capillary: 139 mg/dL — ABNORMAL HIGH (ref 65–99)
Glucose-Capillary: 212 mg/dL — ABNORMAL HIGH (ref 65–99)
Glucose-Capillary: 202 mg/dL — ABNORMAL HIGH (ref 65–99)

## 2015-05-16 LAB — PROTIME-INR
INR: 0.91 (ref 0.00–1.49)
Prothrombin Time: 12.3 seconds (ref 11.6–15.2)

## 2015-05-16 SURGERY — LOWER EXTREMITY ANGIOGRAPHY
Laterality: Left

## 2015-05-16 SURGERY — ANGIOGRAM, LOWER EXTREMITY

## 2015-05-16 MED ORDER — CLOPIDOGREL BISULFATE 300 MG PO TABS
ORAL_TABLET | ORAL | Status: AC
  Filled 2015-05-16: qty 1

## 2015-05-16 MED ORDER — SODIUM CHLORIDE 0.9 % WEIGHT BASED INFUSION
3.0000 mL/kg/h | INTRAVENOUS | Status: DC
  Administered 2015-05-16: 3 mL/kg/h via INTRAVENOUS

## 2015-05-16 MED ORDER — IBUPROFEN 200 MG PO TABS: 400.0000 mg | ORAL_TABLET | Freq: Four times a day (QID) | ORAL | Status: DC | PRN

## 2015-05-16 MED ORDER — IODIXANOL 320 MG/ML IV SOLN
INTRAVENOUS | Status: DC | PRN
  Administered 2015-05-16: 230 mL via INTRAVENOUS

## 2015-05-16 MED ORDER — INSULIN ASPART 100 UNIT/ML ~~LOC~~ SOLN
0.0000 [IU] | Freq: Three times a day (TID) | SUBCUTANEOUS | Status: DC
  Administered 2015-05-16: 19:00:00 3 [IU] via SUBCUTANEOUS
  Administered 2015-05-17: 1 [IU] via SUBCUTANEOUS

## 2015-05-16 MED ORDER — SODIUM CHLORIDE 0.9 % IJ SOLN: 3.0000 mL | Freq: Two times a day (BID) | INTRAMUSCULAR | Status: DC

## 2015-05-16 MED ORDER — CLOPIDOGREL BISULFATE 75 MG PO TABS
ORAL_TABLET | ORAL | Status: DC | PRN
  Administered 2015-05-16: 300 mg via ORAL

## 2015-05-16 MED ORDER — METOPROLOL TARTRATE 25 MG PO TABS
100.0000 mg | ORAL_TABLET | Freq: Two times a day (BID) | ORAL | Status: DC
  Administered 2015-05-16 – 2015-05-17 (×3): 100 mg via ORAL
  Filled 2015-05-16 (×3): qty 4

## 2015-05-16 MED ORDER — HYDRALAZINE HCL 20 MG/ML IJ SOLN
10.0000 mg | INTRAMUSCULAR | Status: DC | PRN
  Administered 2015-05-17: 08:00:00 10 mg via INTRAVENOUS
  Filled 2015-05-16: qty 1

## 2015-05-16 MED ORDER — SODIUM CHLORIDE 0.9 % IJ SOLN: 3.0000 mL | INTRAMUSCULAR | Status: DC | PRN

## 2015-05-16 MED ORDER — LIDOCAINE HCL (PF) 1 % IJ SOLN
INTRAMUSCULAR | Status: AC
  Filled 2015-05-16: qty 30

## 2015-05-16 MED ORDER — LOSARTAN POTASSIUM 50 MG PO TABS
50.0000 mg | ORAL_TABLET | Freq: Every day | ORAL | Status: DC
  Administered 2015-05-16 – 2015-05-17 (×2): 50 mg via ORAL
  Filled 2015-05-16 (×2): qty 1

## 2015-05-16 MED ORDER — ATROPINE SULFATE 0.1 MG/ML IJ SOLN
INTRAMUSCULAR | Status: AC
  Filled 2015-05-16: qty 10

## 2015-05-16 MED ORDER — GABAPENTIN 100 MG PO CAPS
100.0000 mg | ORAL_CAPSULE | Freq: Three times a day (TID) | ORAL | Status: DC
  Administered 2015-05-16 – 2015-05-17 (×3): 100 mg via ORAL
  Filled 2015-05-16 (×3): qty 1

## 2015-05-16 MED ORDER — ASPIRIN 81 MG PO CHEW
CHEWABLE_TABLET | ORAL | Status: AC
  Filled 2015-05-16: qty 1

## 2015-05-16 MED ORDER — DOCUSATE SODIUM 100 MG PO CAPS
100.0000 mg | ORAL_CAPSULE | Freq: Every day | ORAL | Status: DC
  Administered 2015-05-17: 08:00:00 100 mg via ORAL
  Filled 2015-05-16: qty 1

## 2015-05-16 MED ORDER — HYDRALAZINE HCL 20 MG/ML IJ SOLN
INTRAMUSCULAR | Status: DC | PRN
  Administered 2015-05-16: 10 mg via INTRAVENOUS

## 2015-05-16 MED ORDER — MORPHINE SULFATE 2 MG/ML IJ SOLN: 2.0000 mg | INTRAMUSCULAR | Status: DC | PRN

## 2015-05-16 MED ORDER — SIMVASTATIN 20 MG PO TABS
40.0000 mg | ORAL_TABLET | Freq: Every day | ORAL | Status: DC
  Administered 2015-05-16: 21:00:00 40 mg via ORAL
  Filled 2015-05-16: qty 2

## 2015-05-16 MED ORDER — SODIUM CHLORIDE 0.9 % WEIGHT BASED INFUSION: 1.0000 mL/kg/h | INTRAVENOUS | Status: DC

## 2015-05-16 MED ORDER — ONDANSETRON HCL 4 MG/2ML IJ SOLN
4.0000 mg | Freq: Four times a day (QID) | INTRAMUSCULAR | Status: DC | PRN
  Administered 2015-05-16: 4 mg via INTRAVENOUS

## 2015-05-16 MED ORDER — ACETAMINOPHEN 325 MG PO TABS: 650.0000 mg | ORAL_TABLET | ORAL | Status: DC | PRN

## 2015-05-16 MED ORDER — FLUOXETINE HCL 20 MG PO TABS
20.0000 mg | ORAL_TABLET | Freq: Every day | ORAL | Status: DC
  Administered 2015-05-17: 20 mg via ORAL
  Filled 2015-05-16 (×2): qty 1

## 2015-05-16 MED ORDER — SODIUM CHLORIDE 0.9 % IV SOLN: 250.0000 mL | INTRAVENOUS | Status: DC | PRN

## 2015-05-16 MED ORDER — SODIUM CHLORIDE 0.9 % IV SOLN: INTRAVENOUS | Status: AC

## 2015-05-16 MED ORDER — ASPIRIN 81 MG PO TBEC
81.0000 mg | DELAYED_RELEASE_TABLET | Freq: Every day | ORAL | Status: DC
  Administered 2015-05-17: 08:00:00 81 mg via ORAL
  Filled 2015-05-16 (×2): qty 1

## 2015-05-16 MED ORDER — HEPARIN SODIUM (PORCINE) 1000 UNIT/ML IJ SOLN
INTRAMUSCULAR | Status: AC
  Filled 2015-05-16: qty 1

## 2015-05-16 MED ORDER — HEPARIN (PORCINE) IN NACL 2-0.9 UNIT/ML-% IJ SOLN
INTRAMUSCULAR | Status: AC
  Filled 2015-05-16: qty 1000

## 2015-05-16 MED ORDER — INSULIN ASPART 100 UNIT/ML ~~LOC~~ SOLN
0.0000 [IU] | Freq: Every day | SUBCUTANEOUS | Status: DC
  Administered 2015-05-16: 2 [IU] via SUBCUTANEOUS

## 2015-05-16 MED ORDER — ONDANSETRON HCL 4 MG/2ML IJ SOLN
INTRAMUSCULAR | Status: AC
  Filled 2015-05-16: qty 2

## 2015-05-16 MED ORDER — HYDRALAZINE HCL 20 MG/ML IJ SOLN
INTRAMUSCULAR | Status: AC
Start: 2015-05-16 — End: 2015-05-16
  Filled 2015-05-16: qty 1

## 2015-05-16 MED ORDER — HEPARIN SODIUM (PORCINE) 1000 UNIT/ML IJ SOLN
INTRAMUSCULAR | Status: DC | PRN
  Administered 2015-05-16: 5000 [IU] via INTRAVENOUS
  Administered 2015-05-16: 4000 [IU] via INTRAVENOUS

## 2015-05-16 MED ORDER — ASPIRIN 81 MG PO CHEW
81.0000 mg | CHEWABLE_TABLET | ORAL | Status: AC
  Administered 2015-05-16: 81 mg via ORAL

## 2015-05-16 MED ORDER — GLIMEPIRIDE 4 MG PO TABS
4.0000 mg | ORAL_TABLET | Freq: Every day | ORAL | Status: DC
  Administered 2015-05-17: 08:00:00 4 mg via ORAL
  Filled 2015-05-16 (×2): qty 1

## 2015-05-16 SURGICAL SUPPLY — 23 items
BALLN LUTONIX DCB 5X60X130 (BALLOONS) ×5
BALLOON LUTONIX DCB 5X60X130 (BALLOONS) ×3 IMPLANT
CATH CROSS OVER TEMPO 5F (CATHETERS) ×5 IMPLANT
CATH HAWKONE LS STANDARD TIP (CATHETERS) ×5
CATH HAWKONE LS STD TIP (CATHETERS) ×3 IMPLANT
DEVICE CONTINUOUS FLUSH (MISCELLANEOUS) ×5 IMPLANT
DEVICE SPIDERFX EMB PROT 5MM (WIRE) ×5 IMPLANT
HAND CONTROLLER AVANTA (MISCELLANEOUS) ×5 IMPLANT
KIT ENCORE 26 ADVANTAGE (KITS) ×5 IMPLANT
KIT PV (KITS) ×5 IMPLANT
SET AVANTA SINGLE PATIENT (MISCELLANEOUS) IMPLANT
SHEATH AVANTA HAND CONTROLLER (MISCELLANEOUS) ×5 IMPLANT
SHEATH HIGHFLEX ANSEL 7FR 55CM (SHEATH) ×5 IMPLANT
SHEATH PINNACLE 5F 10CM (SHEATH) ×5 IMPLANT
SHEATH PINNACLE 7F 10CM (SHEATH) ×5 IMPLANT
SHEATH PINNACLE 8F 10CM (SHEATH) ×5 IMPLANT
SYR MEDRAD MARK V 150ML (SYRINGE) IMPLANT
TAPE RADIOPAQUE TURBO (MISCELLANEOUS) ×5 IMPLANT
TRANSDUCER W/STOPCOCK (MISCELLANEOUS) ×5 IMPLANT
TRAY PV CATH (CUSTOM PROCEDURE TRAY) ×5 IMPLANT
TUBING CIL FLEX 10 FLL-RA (TUBING) ×5 IMPLANT
WIRE HITORQ VERSACORE ST 145CM (WIRE) ×10 IMPLANT
WIRE SPARTACORE .014X300CM (WIRE) ×5 IMPLANT

## 2015-05-16 NOTE — H&P (Signed)
Samantha CrandallMaria [name]  11-13-1951  [id number]  Primary Physician Jeanann LewandowskyJEGEDE, OLUGBEMIGA, MD Primary Cardiologist: Runell GessJonathan J. Canary Fister MD Roseanne RenoFACP,FACC,FAHA, FSCAI   HPI: [name] is a 64 year old moderately overweight widowed Latino female mother of 4 children, grandmother of 20+ grandchildren was accompanied by one of her daughters today. She was referred by Dr. Dellia Nimsiegel, podiatrist, for evaluation of critical limb ischemia. Her cardiac risk factor profile is notable for treated hypertension, diabetes and hyperlipidemia. She has never had a heart attack or stroke. She does get occasional chest pressure. She's had a nonhealing ulcer on her left fourth toe last 5 months and left greater than right lower extreme claudication for the last 6 years worse recently. Lower extremity arterial Doppler studies performed 03/29/15 revealed a right ABI 0.88 with an occluded posterior tibial and dorsalis pedis, patent peroneal, left ABI 0.66 with a high-frequency signal in the distal left SFA.   Current Outpatient Prescriptions  Medication Sig Dispense Refill  . aspirin EC 81 MG EC tablet Take 1 tablet (81 mg total) by mouth daily.    Marland Kitchen. docusate sodium 100 MG CAPS Take 100 mg by mouth daily. 10 capsule 0  . FLUoxetine (PROZAC) 20 MG tablet Take 1 tablet (20 mg total) by mouth daily. 30 tablet 3  . gabapentin (NEURONTIN) 100 MG capsule Take 1 capsule (100 mg total) by mouth 3 (three) times daily. 270 capsule 3  . glimepiride (AMARYL) 4 MG tablet Take 1 tablet (4 mg total) by mouth daily with breakfast. 90 tablet 3  . ibuprofen (ADVIL,MOTRIN) 200 MG tablet Take 400 mg by mouth every 6 (six) hours as needed (pain/fever).    . insulin NPH-regular Human (NOVOLIN 70/30) (70-30) 100 UNIT/ML injection Inject 16 Units into the skin 2 (two) times daily with a meal. 30 mL 3  . Insulin Pen Needle (PEN NEEDLES 3/16") 31G X 5 MM MISC 1 Stick by Does not apply route 2 (two)  times daily at 8 am and 10 pm. 100 each 12  . losartan (COZAAR) 25 MG tablet Take 1 tablet (25 mg total) by mouth daily. 90 tablet 3  . metFORMIN (GLUCOPHAGE) 1000 MG tablet Take 1 tablet (1,000 mg total) by mouth 2 (two) times daily with a meal. 180 tablet 3  . metoprolol (LOPRESSOR) 100 MG tablet Take 1 tablet (100 mg total) by mouth 2 (two) times daily. 180 tablet 3  . simvastatin (ZOCOR) 40 MG tablet Take 1 tablet (40 mg total) by mouth at bedtime. 90 tablet 3   No current facility-administered medications for this visit.    No Known Allergies  History   Social History  . Marital Status: Single    Spouse Name: N/A  . Number of Children: N/A  . Years of Education: N/A   Occupational History  . Not on file.   Social History Main Topics  . Smoking status: Never Smoker   . Smokeless tobacco: Not on file  . Alcohol Use: No  . Drug Use: No  . Sexual Activity: Not on file   Other Topics Concern  . Not on file   Social History Narrative     Review of Systems: General: negative for chills, fever, night sweats or weight changes.  Cardiovascular: negative for chest pain, dyspnea on exertion, edema, orthopnea, palpitations, paroxysmal nocturnal dyspnea or shortness of breath Dermatological: negative for rash Respiratory: negative for cough or wheezing Urologic: negative for hematuria Abdominal: negative for nausea, vomiting, diarrhea, bright red blood per rectum, melena, or  hematemesis Neurologic: negative for visual changes, syncope, or dizziness All other systems reviewed and are otherwise negative except as noted above.    Blood pressure 168/86, pulse 68, height 5\' 3"  (1.6 m), weight 149 lb (67.586 kg).  General appearance: alert Neck: no adenopathy, no JVD, supple, symmetrical, trachea midline, thyroid not enlarged, symmetric, no tenderness/mass/nodules and soft left carotid bruit Lungs: clear  to auscultation bilaterally Heart: regular rate and rhythm, S1, S2 normal, no murmur, click, rub or gallop Extremities: extremities normal, atraumatic, no cyanosis or edema  EKG not performed today  ASSESSMENT AND PLAN:   Essential hypertension History of hypertension blood pressure measured at 168/86. She is on losartan and metoprolol. Continue current meds at current dosing   Hyperlipidemia History of hyper-lipidemia on simvastatin 40 mg a day with recent lipid profile performed 01/15/15 revealing a total cholesterol 179, LDL 94 and HDL of 35   PAD (peripheral artery disease) History of nonhealing left fourth toe ulcer with severe left lower extremity claudication and recent Dopplers that suggested occluded tibials on the right with an ABI 0.88 and a high-grade distal left SFA stenosis with a left ABI 0.66. I'm going to arrange for her to undergo angiography and potential intervention for critical limb ischemia.       Runell GessJonathan J. Rasheeda Mulvehill MD FACP,FACC,FAHA, FSCAI Runell GessJonathan J. Makynzee Tigges, M.D., FACP, [facility] HospitalFACC, Kathryne ErikssonFAHA, FSCAI [facility] [address] [place], [place]  [id number]  [phone] 05/16/2015 7:41 AM

## 2015-05-16 NOTE — Progress Notes (Signed)
Translator in room. Patient states that when she closes her eyes, it feels like the bed is moving. Will let Dr. Allyson SabalBerry aware. VVS. Denies discomfort.

## 2015-05-16 NOTE — Progress Notes (Signed)
Nauseated. HR 50's, SB. IVF wide open. Threw up approx 50cc bile emesis. Translator talking to patience. Reassurance given. JWilson taken over manual hold.

## 2015-05-16 NOTE — Progress Notes (Signed)
Daughter in to see. Waiting on 6500 bed

## 2015-05-16 NOTE — Progress Notes (Signed)
Lunch relief for Samantha [name], took over groin hold during hypotensive episode and maintained hemostasis while pt was treated for n&v.  Pressure held 30 minutes resulting in hemostasis without oozing or hematoma.  Site has small amnt of eccymosis to area around entrance site but otherwise level 0.  Pt teaching given and understood with help of interpreter.  Pressure DSG applied and Bedrest started at 1230.  Bilateral DP pulses palpatable now.

## 2015-05-16 NOTE — Progress Notes (Signed)
Pt transferred to rm [id number].  Report given to Janne NapoleonJennifer O Neal RN

## 2015-05-16 NOTE — Progress Notes (Signed)
Nausea gone.

## 2015-05-17 ENCOUNTER — Encounter: Payer: Self-pay | Admitting: Cardiovascular Disease

## 2015-05-17 ENCOUNTER — Other Ambulatory Visit: Payer: Self-pay | Admitting: Physician Assistant

## 2015-05-17 ENCOUNTER — Telehealth: Payer: Self-pay | Admitting: Cardiovascular Disease

## 2015-05-17 ENCOUNTER — Encounter (HOSPITAL_COMMUNITY): Payer: Self-pay | Admitting: Physician Assistant

## 2015-05-17 DIAGNOSIS — I70229 Atherosclerosis of native arteries of extremities with rest pain, unspecified extremity: Secondary | ICD-10-CM

## 2015-05-17 DIAGNOSIS — I739 Peripheral vascular disease, unspecified: Secondary | ICD-10-CM

## 2015-05-17 DIAGNOSIS — I998 Other disorder of circulatory system: Secondary | ICD-10-CM

## 2015-05-17 LAB — GLUCOSE, CAPILLARY: Glucose-Capillary: 148 mg/dL — ABNORMAL HIGH (ref 65–99)

## 2015-05-17 LAB — CBC
HCT: 31.2 % — ABNORMAL LOW (ref 36.0–46.0)
Hemoglobin: 10.9 g/dL — ABNORMAL LOW (ref 12.0–15.0)
MCH: 31.6 pg (ref 26.0–34.0)
MCHC: 34.9 g/dL (ref 30.0–36.0)
MCV: 90.4 fL (ref 78.0–100.0)
PLATELETS: 300 10*3/uL (ref 150–400)
RBC: 3.45 MIL/uL — AB (ref 3.87–5.11)
RDW: 13 % (ref 11.5–15.5)
WBC: 9.4 10*3/uL (ref 4.0–10.5)

## 2015-05-17 LAB — BASIC METABOLIC PANEL
ANION GAP: 8 (ref 5–15)
BUN: 13 mg/dL (ref 6–20)
CO2: 23 mmol/L (ref 22–32)
Calcium: 8.9 mg/dL (ref 8.9–10.3)
Chloride: 108 mmol/L (ref 101–111)
Creatinine, Ser: 1.07 mg/dL — ABNORMAL HIGH (ref 0.44–1.00)
GFR calc Af Amer: >60 mL/min (ref >60)
GFR, EST NON AFRICAN AMERICAN: 55 mL/min — AB (ref >60)
Glucose, Bld: 113 mg/dL — ABNORMAL HIGH (ref 65–99)
Potassium: 4 mmol/L (ref 3.5–5.1)
Sodium: 139 mmol/L (ref 135–145)

## 2015-05-17 MED FILL — Heparin Sodium (Porcine) 2 Unit/ML in Sodium Chloride 0.9%: INTRAMUSCULAR | Qty: 1000 | Status: AC

## 2015-05-17 MED FILL — Lidocaine HCl Local Preservative Free (PF) Inj 1%: INTRAMUSCULAR | Qty: 30 | Status: AC

## 2015-05-17 MED FILL — Clopidogrel Bisulfate Tab 300 MG (Base Equiv): ORAL | Qty: 1 | Status: AC

## 2015-05-17 NOTE — Progress Notes (Signed)
Blood pressure somewhat labile this am.  Pt. States that it always runs high at appts and in hospital.  Manual BP done; patient DC'd home.

## 2015-05-17 NOTE — Telephone Encounter (Signed)
Closed encounter °

## 2015-05-17 NOTE — Discharge Summary (Signed)
CARDIOLOGY DISCHARGE SUMMARY   Patient ID: Samantha [name] MRN: [id number] DOB/AGE: 02-Aug-1951 64 y.o.  Admit date: 05/16/2015 Discharge date: 05/17/2015  PCP: Angelica Chessman, MD Primary Cardiologist: Dr Gwenlyn Found  Primary Discharge Diagnosis:  Critical lower limb ischemia Secondary Discharge Diagnosis:  Active Problems:   PAD (peripheral artery disease)  Procedures Performed: 1. Abdominal aortogram 2. Bilateral iliac angiogram with bifemoral runoff 3. Hawk 1 directional atherectomy left SFA and above-the-knee popliteal artery 4. Drug eluting balloon angioplasty above-the-knee popliteal artery on the left  Hospital Course: Samantha [name] is a 64 y.o. female with a history of PAD, diabetes, hypertension and hyperlipidemia. She was evaluated by Dr. Gwenlyn Found for claudication symptoms and scheduled for lower extremity angiogram. She came to the hospital for the procedure on 05/16/2015.  Procedure results are below. She had atherectomy of the left SFA and popliteal artery above the knee. She had drug-eluting balloon angioplasty to the popliteal artery in that area. She tolerated the procedure well. She had some problems with nausea and vomiting post-procedure, but pressure was held on the groin and she was treated medically with improvement. There were no bleeding issues and no hematoma.  On 05/17/2015, she was seen by Dr. Martinique and all data were reviewed. Distal pulses were intact in both lower extremities and her right groin cath site had only a faint bruit. Her right groin was or but there was no hematoma or ecchymosis. She was somewhat anemic after the procedure but this is consistent with labs performed in January and she is asymptomatic so this can be followed as an outpatient. Her MCV is within normal limits. She had a minimal elevation in her creatinine, but can orally hydrate. No further inpatient workup is indicated and she  is considered stable for discharge, to follow up as an outpatient.  Labs:   Lab Results  Component Value Date   WBC 9.4 05/17/2015   HGB 10.9* 05/17/2015   HCT 31.2* 05/17/2015   MCV 90.4 05/17/2015   PLT 300 05/17/2015     Recent Labs Lab 05/17/15 0309  NA 139  K 4.0  CL 108  CO2 23  BUN 13  CREATININE 1.07*  CALCIUM 8.9  GLUCOSE 113*    Recent Labs  05/16/15 0557  INR 0.91    Cardiac Cath: 05/16/2015 Angiographic Data:  1: Abdominal aortogram-the renal arteries are widely patent. The infrarenal abdominal aorta and iliac bifurcation were free of significant atherosclerotic changes 2: Left lower extremity-75% focal proximal left SFA stenosis. 95% eccentric focal above-the-knee popliteal artery stenosis. One-vessel runoff via the peroneal with an 80-90% proximal peroneal artery stenosis. 3: Right lower extremity-40% segmental proximal right SFA stenosis, 50% segmental above-the-knee popliteal artery stenosis with one-vessel runoff via the peroneal. The proximal peroneal had a 90% stenosis IMPRESSION:Samantha [name] has high-grade proximal left SFA, P1 segment left popliteal and tibial vessel disease with lifestyle limiting claudication. We will proceed with [facility] 1 directional atherectomy plus or minus drug-eluting balloon and possibly. Procedure Description:contralateral access obtained with a crossover catheter and 7 French 55 cm long Ansel multipurpose sheath. The patient received a total of 9000 units of heparin intravenously with an ACT of 257. Total contrast and demonstrated to the patient was 230 mL. She received Plavix 300 mg by mouth at the end of the case. I crossed both left SFA and popliteal lesions with a 014 [place] core wire and placed a 5 mm spider distal protection device in the P3 segment of the left lower extremity. I then  used a talk 1 LS device to perform directional atherectomy of the proximal left SFA and the above-the-knee popliteal artery performing  multiple circumferential cuts and obtaining a copious amount of atherosclerotic plaque. I then performed drug eluting balloon angioplasty with a 5 mm x 6 cm Lutonix drug-eluting balloon in the P1 segment at 4 atm. The Mylanta graphic result with reduction of a 75% proximal left SFA stenosis to 0% residual and 95% P1 segment of left popliteal artery to 0% residual with a small linear dissection that was not flow-limiting. The sheath was then withdrawn across the bifurcation and exchanged for a short 7 French sheath over an 035 wire. There was a small hematoma and this was upgraded to 8 [place] sheath. Final Impression: successful Hawk 1 directional atherectomy of proximal left SFA and P1 segment of left lower extremity with drug eluting balloon angioplasty of the P1 segment for critical limb ischemia/lifestyle including claudication. The patient tolerated the procedure well. The sheath will be removed once the ACT falls below 170 pressure held. She'll be hydrated overnight and discharged home in the morning. She will need lower extremity arterial Doppler studies in the modified office next week and will see me back in the office today 3 weeks thereafter.  FOLLOW UP PLANS AND APPOINTMENTS No Known Allergies   Medication List    TAKE these medications        aspirin 81 MG EC tablet  Take 1 tablet (81 mg total) by mouth daily.     DSS 100 MG Caps  Take 100 mg by mouth daily.     FLUoxetine 20 MG tablet  Commonly known as:  PROZAC  Take 1 tablet (20 mg total) by mouth daily.     gabapentin 100 MG capsule  Commonly known as:  NEURONTIN  Take 1 capsule (100 mg total) by mouth 3 (three) times daily.     glimepiride 4 MG tablet  Commonly known as:  AMARYL  Take 1 tablet (4 mg total) by mouth daily with breakfast.     ibuprofen 200 MG tablet  Commonly known as:  ADVIL,MOTRIN  Take 400 mg by mouth every 6 (six) hours as needed (pain/fever).     insulin NPH-regular Human (70-30) 100 UNIT/ML  injection  Commonly known as:  NOVOLIN 70/30  Inject 16 Units into the skin 2 (two) times daily with a meal.     losartan 50 MG tablet  Commonly known as:  COZAAR  Take 1 tablet (50 mg total) by mouth daily.     metFORMIN 1000 MG tablet  Commonly known as:  GLUCOPHAGE  Take 1 tablet (1,000 mg total) by mouth 2 (two) times daily with a meal.  Notes to Patient:  Hold for 48 hours, restart on 05/20/2015.     metoprolol 100 MG tablet  Commonly known as:  LOPRESSOR  Take 1 tablet (100 mg total) by mouth 2 (two) times daily.     Pen Needles 3/16" 31G X 5 MM Misc  1 Stick by Does not apply route 2 (two) times daily at 8 am and 10 pm.     simvastatin 40 MG tablet  Commonly known as:  ZOCOR  Take 1 tablet (40 mg total) by mouth at bedtime.        Discharge Instructions    Call MD for:  redness, tenderness, or signs of infection (pain, swelling, redness, odor or green/yellow discharge around incision site)    Complete by:  As directed  Diet - low sodium heart healthy    Complete by:  As directed      Diet Carb Modified    Complete by:  As directed      Increase activity slowly    Complete by:  As directed           Follow-up Information    Follow up with Quay Burow, MD.   Specialties:  Cardiology, Radiology   Why:  Ultrasound test on the left leg next week and then follow-up with Dr. Gwenlyn Found. The office will call.   Contact information:   [address] [place] [address] [phone]       BRING ALL MEDICATIONS WITH YOU TO FOLLOW UP APPOINTMENTS  Time spent with patient to include physician time: 42 min Signed: Rosaria Ferries, PA-C 05/17/2015, 7:53 AM Co-Sign MD

## 2015-05-17 NOTE — Progress Notes (Signed)
CARDIOLOGY DISCHARGE SUMMARY   Patient ID: Samantha [name] MRN: [id number] DOB/AGE: [birth year] 62 y.o.  Admit date: 05/16/2015 Discharge date: 05/17/2015  PCP: Angelica Chessman, MD Primary Cardiologist: Dr Gwenlyn Found  Primary Discharge Diagnosis:  Critical lower limb ischemia Secondary Discharge Diagnosis:  Active Problems:   PAD (peripheral artery disease)  Procedures Performed: 1. Abdominal aortogram 2. Bilateral iliac angiogram with bifemoral runoff 3. Hawk 1 directional atherectomy left SFA and above-the-knee popliteal artery 4. Drug eluting balloon angioplasty above-the-knee popliteal artery on the left  Hospital Course: Samantha [name] is a 64 y.o. female with a history of PAD, diabetes, hypertension and hyperlipidemia. She was evaluated by Dr. Gwenlyn Found for claudication symptoms and scheduled for lower extremity angiogram. She came to the hospital for the procedure on 05/16/2015.  Procedure results are below. She had atherectomy of the left SFA and popliteal artery above the knee. She had drug-eluting balloon angioplasty to the popliteal artery in that area. She tolerated the procedure well. She had some problems with nausea and vomiting post-procedure, but pressure was held on the groin and she was treated medically with improvement. There were no bleeding issues and no hematoma.  On 05/17/2015, she was seen by Dr. Martinique and all data were reviewed. Distal pulses were intact in both lower extremities and her right groin cath site had only a faint bruit. Her right groin was or but there was no hematoma or ecchymosis. She was somewhat anemic after the procedure but this is consistent with labs performed in January and she is asymptomatic so this can be followed as an outpatient. Her MCV is within normal limits. She had a minimal elevation in her creatinine, but can orally hydrate. No further inpatient workup is indicated and she  is considered stable for discharge, to follow up as an outpatient.  BP 174/69 mmHg  Pulse 67  Temp(Src) 98.4 F (36.9 C) (Oral)  Resp 18  Ht _0  (1.6 m)  Wt 150 lb 2.1 oz (68.1 kg)  BMI 26.60 kg/m2  SpO2 96% General: Well developed, well nourished, female in no acute distress Head: Eyes PERRLA, No xanthomas.   Normocephalic and atraumatic  Lungs: Clear bilaterally to auscultation. Heart: HRRR S1 S2, without MRG.  Pulses are 2+ & equal. No carotid bruit. No JVD. Abdomen: Bowel sounds are present, abdomen soft and non-tender without masses or  hernias noted. Msk: Normal strength and tone for age. Extremities: No clubbing, cyanosis or edema.  Right groin cath site without ecchymosis or hematoma. Soft bruit noted  Skin:  No rashes or lesions noted. Neuro: Alert and oriented X 3. Psych:  Good affect, responds appropriately  Labs:   Lab Results  Component Value Date   WBC 9.4 05/17/2015   HGB 10.9* 05/17/2015   HCT 31.2* 05/17/2015   MCV 90.4 05/17/2015   PLT 300 05/17/2015     Recent Labs Lab 05/17/15 0309  NA 139  K 4.0  CL 108  CO2 23  BUN 13  CREATININE 1.07*  CALCIUM 8.9  GLUCOSE 113*    Recent Labs  05/16/15 0557  INR 0.91    Cardiac Cath: 05/16/2015 Angiographic Data:  1: Abdominal aortogram-the renal arteries are widely patent. The infrarenal abdominal aorta and iliac bifurcation were free of significant atherosclerotic changes 2: Left lower extremity-75% focal proximal left SFA stenosis. 95% eccentric focal above-the-knee popliteal artery stenosis. One-vessel runoff via the peroneal with an 80-90% proximal peroneal artery stenosis. 3: Right lower extremity-40% segmental proximal right SFA stenosis, 50% segmental  above-the-knee popliteal artery stenosis with one-vessel runoff via the peroneal. The proximal peroneal had a 90% stenosis IMPRESSION:Samantha [name] has high-grade proximal left SFA, P1 segment left popliteal and tibial vessel disease with  lifestyle limiting claudication. We will proceed with [facility] 1 directional atherectomy plus or minus drug-eluting balloon and possibly. Procedure Description:contralateral access obtained with a crossover catheter and 7 French 55 cm long Ansel multipurpose sheath. The patient received a total of 9000 units of heparin intravenously with an ACT of 257. Total contrast and demonstrated to the patient was 230 mL. She received Plavix 300 mg by mouth at the end of the case. I crossed both left SFA and popliteal lesions with a 014 [place] core wire and placed a 5 mm spider distal protection device in the P3 segment of the left lower extremity. I then used a talk 1 LS device to perform directional atherectomy of the proximal left SFA and the above-the-knee popliteal artery performing multiple circumferential cuts and obtaining a copious amount of atherosclerotic plaque. I then performed drug eluting balloon angioplasty with a 5 mm x 6 cm Lutonix drug-eluting balloon in the P1 segment at 4 atm. The Mylanta graphic result with reduction of a 75% proximal left SFA stenosis to 0% residual and 95% P1 segment of left popliteal artery to 0% residual with a small linear dissection that was not flow-limiting. The sheath was then withdrawn across the bifurcation and exchanged for a short 7 French sheath over an 035 wire. There was a small hematoma and this was upgraded to 8 [place] sheath. Final Impression: successful Hawk 1 directional atherectomy of proximal left SFA and P1 segment of left lower extremity with drug eluting balloon angioplasty of the P1 segment for critical limb ischemia/lifestyle including claudication. The patient tolerated the procedure well. The sheath will be removed once the ACT falls below 170 pressure held. She'll be hydrated overnight and discharged home in the morning. She will need lower extremity arterial Doppler studies in the modified office next week and will see me back in the office today 3 weeks  thereafter.  FOLLOW UP PLANS AND APPOINTMENTS No Known Allergies   Medication List    TAKE these medications        aspirin 81 MG EC tablet  Take 1 tablet (81 mg total) by mouth daily.     DSS 100 MG Caps  Take 100 mg by mouth daily.     FLUoxetine 20 MG tablet  Commonly known as:  PROZAC  Take 1 tablet (20 mg total) by mouth daily.     gabapentin 100 MG capsule  Commonly known as:  NEURONTIN  Take 1 capsule (100 mg total) by mouth 3 (three) times daily.     glimepiride 4 MG tablet  Commonly known as:  AMARYL  Take 1 tablet (4 mg total) by mouth daily with breakfast.     ibuprofen 200 MG tablet  Commonly known as:  ADVIL,MOTRIN  Take 400 mg by mouth every 6 (six) hours as needed (pain/fever).     insulin NPH-regular Human (70-30) 100 UNIT/ML injection  Commonly known as:  NOVOLIN 70/30  Inject 16 Units into the skin 2 (two) times daily with a meal.     losartan 50 MG tablet  Commonly known as:  COZAAR  Take 1 tablet (50 mg total) by mouth daily.     metFORMIN 1000 MG tablet  Commonly known as:  GLUCOPHAGE  Take 1 tablet (1,000 mg total) by mouth 2 (two) times daily with  a meal.  Notes to Patient:  Hold for 48 hours, restart on 05/20/2015.     metoprolol 100 MG tablet  Commonly known as:  LOPRESSOR  Take 1 tablet (100 mg total) by mouth 2 (two) times daily.     Pen Needles 3/16" 31G X 5 MM Misc  1 Stick by Does not apply route 2 (two) times daily at 8 am and 10 pm.     simvastatin 40 MG tablet  Commonly known as:  ZOCOR  Take 1 tablet (40 mg total) by mouth at bedtime.        Discharge Instructions    Call MD for:  redness, tenderness, or signs of infection (pain, swelling, redness, odor or green/yellow discharge around incision site)    Complete by:  As directed      Diet - low sodium heart healthy    Complete by:  As directed      Diet Carb Modified    Complete by:  As directed      Increase activity slowly    Complete by:  As directed            Follow-up Information    Follow up with Quay Burow, MD.   Specialties:  Cardiology, Radiology   Why:  Ultrasound test on the left leg next week and then follow-up with Dr. Gwenlyn Found. The office will call.   Contact information:   [address] [place] [address] [phone]       BRING ALL MEDICATIONS WITH YOU TO FOLLOW UP APPOINTMENTS  Time spent with patient to include physician time: 42 min Signed: Rosaria Ferries, PA-C 05/17/2015, 8:13 AM Co-Sign MD

## 2015-05-17 NOTE — Progress Notes (Signed)
Interpreter Wyvonnia DuskyGraciela Namihira for Discharge Instructions

## 2015-05-17 NOTE — Progress Notes (Signed)
Extensive discharge teaching done with patient and daughter.  Appreciate assistance of interpretor.

## 2015-05-20 ENCOUNTER — Other Ambulatory Visit: Payer: Self-pay | Admitting: Cardiovascular Disease

## 2015-05-20 DIAGNOSIS — I739 Peripheral vascular disease, unspecified: Secondary | ICD-10-CM

## 2015-05-23 ENCOUNTER — Encounter: Payer: Self-pay | Admitting: *Deleted

## 2015-05-28 ENCOUNTER — Inpatient Hospital Stay (HOSPITAL_COMMUNITY): Admission: RE | Admit: 2015-05-28 | Payer: Self-pay | Source: Ambulatory Visit

## 2015-06-06 ENCOUNTER — Telehealth (HOSPITAL_COMMUNITY): Payer: Self-pay | Admitting: *Deleted

## 2015-06-07 ENCOUNTER — Ambulatory Visit: Payer: Self-pay | Admitting: Cardiovascular Disease

## 2015-06-21 ENCOUNTER — Encounter: Payer: Self-pay | Admitting: Cardiovascular Disease

## 2015-06-21 ENCOUNTER — Ambulatory Visit (INDEPENDENT_AMBULATORY_CARE_PROVIDER_SITE_OTHER): Payer: Medicaid Other | Admitting: Cardiovascular Disease

## 2015-06-21 VITALS — BP 126/60 | HR 63 | Ht 60.0 in | Wt 150.0 lb

## 2015-06-21 DIAGNOSIS — I739 Peripheral vascular disease, unspecified: Secondary | ICD-10-CM

## 2015-06-21 DIAGNOSIS — I10 Essential (primary) hypertension: Secondary | ICD-10-CM

## 2015-06-21 NOTE — Assessment & Plan Note (Signed)
Mrs. Samantha [name] returns today for follow-up of her peripheral vascular procedure which I performed on 05/16/15. She was originally referred to me by Dr. Dellia Nims, her podiatrist with a slowly healing left toe ulcer and claudication. I performed angiography, directional atherectomy and drug-eluting balloon angioplasty of 2 high-grade proximal and mid left SFA lesions. She did have one vessel runoff via a highly diseased peroneal artery. Her symptoms have markedly improved. Procedure was uncomplicated. Will get lower extremity arterial Doppler studies to follow this up.

## 2015-06-21 NOTE — Patient Instructions (Signed)
Your physician wants you to follow-up in: 1 year with Dr Allyson Sabal.  You will receive a reminder letter in the mail two months in advance. If you don't receive a letter, please call our office to schedule the follow-up appointment.   Please  Schedule your lower extremity dopplers.

## 2015-06-21 NOTE — Progress Notes (Signed)
06/21/2015 Mkya Vogan   01/14/1951  [id number]  Primary Physician Jeanann Lewandowsky, MD Primary Cardiologist: Runell Gess MD Samantha [name]   HPI:  Samantha [name] is a 64 year old moderately overweight widowed Latino female mother of 4 children, grandmother of 20+ grandchildren was accompanied by a Nurse, learning disability today. I last saw her in the office 04/12/15.She was referred by Dr. Dellia Nims, podiatrist, for evaluation of critical limb ischemia. Her cardiac risk factor profile is notable for treated hypertension, diabetes and hyperlipidemia. She has never had a heart attack or stroke. She does get occasional chest pressure. She's had a nonhealing ulcer on her left fourth toe last 5 months and left greater than right lower extreme claudication for the last 6 years worse recently. Lower extremity arterial Doppler studies performed 03/29/15 revealed a right ABI 0.88 with an occluded posterior tibial and dorsalis pedis, patent peroneal, left ABI 0.66 with a high-frequency signal in the distal left SFA. I performed angiography on her 05/16/15 revealing high-grade proximal and mid left SFA stenosis with 1 vessel runoff via a highly diseased peroneal. I performed direction atherectomy followed by drug eluding into plasty with excellent angiographic result. Her symptoms have resolved. We will order lower extremity arterial Doppler studies.   Current Outpatient Prescriptions  Medication Sig Dispense Refill  . aspirin 81 MG EC tablet Take 1 tablet (81 mg total) by mouth daily. 30 tablet 3  . docusate sodium 100 MG CAPS Take 100 mg by mouth daily. 10 capsule 0  . FLUoxetine (PROZAC) 20 MG tablet Take 1 tablet (20 mg total) by mouth daily. 30 tablet 3  . gabapentin (NEURONTIN) 100 MG capsule Take 1 capsule (100 mg total) by mouth 3 (three) times daily. 270 capsule 3  . glimepiride (AMARYL) 4 MG tablet Take 1 tablet (4 mg total) by mouth daily with breakfast. 90 tablet 3  . ibuprofen  (ADVIL,MOTRIN) 200 MG tablet Take 400 mg by mouth every 6 (six) hours as needed (pain/fever).    . insulin NPH-regular Human (NOVOLIN 70/30) (70-30) 100 UNIT/ML injection Inject 16 Units into the skin 2 (two) times daily with a meal. 30 mL 3  . Insulin Pen Needle (PEN NEEDLES 3/16") 31G X 5 MM MISC 1 Stick by Does not apply route 2 (two) times daily at 8 am and 10 pm. 100 each 12  . losartan (COZAAR) 50 MG tablet Take 1 tablet (50 mg total) by mouth daily. 30 tablet 2  . metFORMIN (GLUCOPHAGE) 1000 MG tablet Take 1 tablet (1,000 mg total) by mouth 2 (two) times daily with a meal. 180 tablet 3  . metoprolol (LOPRESSOR) 100 MG tablet Take 1 tablet (100 mg total) by mouth 2 (two) times daily. 180 tablet 3  . simvastatin (ZOCOR) 40 MG tablet Take 1 tablet (40 mg total) by mouth at bedtime. 90 tablet 3   No current facility-administered medications for this visit.    No Known Allergies  History   Social History  . Marital Status: Widowed    Spouse Name: N/A  . Number of Children: N/A  . Years of Education: N/A   Occupational History  . Not on file.   Social History Main Topics  . Smoking status: Never Smoker   . Smokeless tobacco: Never Used  . Alcohol Use: No  . Drug Use: No  . Sexual Activity: No   Other Topics Concern  . Not on file   Social History Narrative     Review of Systems: General: negative for chills,  fever, night sweats or weight changes.  Cardiovascular: negative for chest pain, dyspnea on exertion, edema, orthopnea, palpitations, paroxysmal nocturnal dyspnea or shortness of breath Dermatological: negative for rash Respiratory: negative for cough or wheezing Urologic: negative for hematuria Abdominal: negative for nausea, vomiting, diarrhea, bright red blood per rectum, melena, or hematemesis Neurologic: negative for visual changes, syncope, or dizziness All other systems reviewed and are otherwise negative except as noted above.    Blood pressure 126/60,  pulse 63, height 5' (1.524 m), weight 150 lb (68.04 kg).  General appearance: alert and no distress Neck: no adenopathy, no carotid bruit, no JVD, supple, symmetrical, trachea midline and thyroid not enlarged, symmetric, no tenderness/mass/nodules Lungs: clear to auscultation bilaterally Heart: regular rate and rhythm, S1, S2 normal, no murmur, click, rub or gallop Extremities: extremities normal, atraumatic, no cyanosis or edema  EKG normal sinus rhythm at 63 without ST or T-wave changes. I personally reviewed this EKG  ASSESSMENT AND PLAN:   PAD (peripheral artery disease) Mrs. Samantha [name] returns today for follow-up of her peripheral vascular procedure which I performed on 05/16/15. She was originally referred to me by Dr. Dellia Nims, her podiatrist with a slowly healing left toe ulcer and claudication. I performed angiography, directional atherectomy and drug-eluting balloon angioplasty of 2 high-grade proximal and mid left SFA lesions. She did have one vessel runoff via a highly diseased peroneal artery. Her symptoms have markedly improved. Procedure was uncomplicated. Will get lower extremity arterial Doppler studies to follow this up.      Runell Gess MD FACP,FACC,FAHA, [facility] 06/21/2015 12:12 PM

## 2015-07-03 ENCOUNTER — Other Ambulatory Visit: Payer: Self-pay | Admitting: Cardiovascular Disease

## 2015-07-03 DIAGNOSIS — I739 Peripheral vascular disease, unspecified: Secondary | ICD-10-CM

## 2015-07-12 ENCOUNTER — Encounter (HOSPITAL_COMMUNITY): Payer: Medicaid Other

## 2015-07-16 ENCOUNTER — Ambulatory Visit (HOSPITAL_COMMUNITY)
Admission: RE | Admit: 2015-07-16 | Discharge: 2015-07-16 | Disposition: A | Discharge status: Home / Self Care | Payer: Medicaid Other | Source: Ambulatory Visit | Attending: Cardiovascular Disease | Admitting: Cardiovascular Disease

## 2015-07-16 DIAGNOSIS — Z9862 Peripheral vascular angioplasty status: Secondary | ICD-10-CM | POA: Insufficient documentation

## 2015-07-16 DIAGNOSIS — E119 Type 2 diabetes mellitus without complications: Secondary | ICD-10-CM | POA: Insufficient documentation

## 2015-07-16 DIAGNOSIS — I70202 Unspecified atherosclerosis of native arteries of extremities, left leg: Secondary | ICD-10-CM | POA: Insufficient documentation

## 2015-07-16 DIAGNOSIS — E785 Hyperlipidemia, unspecified: Secondary | ICD-10-CM | POA: Insufficient documentation

## 2015-07-16 DIAGNOSIS — I10 Essential (primary) hypertension: Secondary | ICD-10-CM

## 2015-07-16 DIAGNOSIS — I739 Peripheral vascular disease, unspecified: Secondary | ICD-10-CM

## 2015-07-17 ENCOUNTER — Encounter: Payer: Self-pay | Admitting: *Deleted

## 2015-07-31 ENCOUNTER — Other Ambulatory Visit: Payer: Self-pay | Admitting: Internal Medicine

## 2015-08-15 ENCOUNTER — Telehealth: Payer: Self-pay | Admitting: Internal Medicine

## 2015-08-15 NOTE — Telephone Encounter (Signed)
Patient came into office requesting medication refill on losartan (COZAAR) 50 MG tablet. Patient states she has been out of medication for 3 weeks and is not feeling well. Please f/u

## 2015-12-18 ENCOUNTER — Other Ambulatory Visit: Payer: Self-pay | Admitting: Internal Medicine

## 2015-12-18 DIAGNOSIS — E118 Type 2 diabetes mellitus with unspecified complications: Secondary | ICD-10-CM

## 2015-12-18 DIAGNOSIS — Z794 Long term (current) use of insulin: Principal | ICD-10-CM

## 2015-12-18 MED ORDER — INSULIN NPH ISOPHANE & REGULAR (70-30) 100 UNIT/ML ~~LOC~~ SUSP: 16.0000 [IU] | Freq: Two times a day (BID) | SUBCUTANEOUS | Status: DC

## 2016-01-07 MED FILL — ?METOPROLOL 100 MG TABLET: 100 | 30 days supply | Qty: 60 | Fill #6

## 2016-01-07 MED FILL — ?METFORMIN HCL 1,000 MG TAB: 1000 | 30 days supply | Qty: 60 | Fill #6

## 2016-01-07 MED FILL — ?GLIMEPIRIDE 4 MG TABLET: 4 | 30 days supply | Qty: 30 | Fill #9

## 2016-01-16 ENCOUNTER — Encounter (HOSPITAL_COMMUNITY): Payer: Self-pay | Admitting: *Deleted

## 2016-01-16 ENCOUNTER — Emergency Department (HOSPITAL_COMMUNITY)
Admission: EM | Admit: 2016-01-16 | Discharge: 2016-01-16 | Disposition: A | Discharge status: Home / Self Care | Payer: Medicaid Other | Attending: Emergency Medicine | Admitting: Emergency Medicine

## 2016-01-16 ENCOUNTER — Emergency Department (HOSPITAL_COMMUNITY): Payer: Medicaid Other

## 2016-01-16 DIAGNOSIS — Z7984 Long term (current) use of oral hypoglycemic drugs: Secondary | ICD-10-CM | POA: Insufficient documentation

## 2016-01-16 DIAGNOSIS — E11628 Type 2 diabetes mellitus with other skin complications: Secondary | ICD-10-CM

## 2016-01-16 DIAGNOSIS — Z79899 Other long term (current) drug therapy: Secondary | ICD-10-CM | POA: Insufficient documentation

## 2016-01-16 DIAGNOSIS — E785 Hyperlipidemia, unspecified: Secondary | ICD-10-CM | POA: Insufficient documentation

## 2016-01-16 DIAGNOSIS — E11621 Type 2 diabetes mellitus with foot ulcer: Secondary | ICD-10-CM | POA: Insufficient documentation

## 2016-01-16 DIAGNOSIS — L089 Local infection of the skin and subcutaneous tissue, unspecified: Secondary | ICD-10-CM

## 2016-01-16 DIAGNOSIS — I10 Essential (primary) hypertension: Secondary | ICD-10-CM | POA: Insufficient documentation

## 2016-01-16 DIAGNOSIS — Z794 Long term (current) use of insulin: Secondary | ICD-10-CM | POA: Insufficient documentation

## 2016-01-16 DIAGNOSIS — L97529 Non-pressure chronic ulcer of other part of left foot with unspecified severity: Secondary | ICD-10-CM | POA: Insufficient documentation

## 2016-01-16 DIAGNOSIS — Z7982 Long term (current) use of aspirin: Secondary | ICD-10-CM | POA: Insufficient documentation

## 2016-01-16 LAB — CBC WITH DIFFERENTIAL/PLATELET
BASOS PCT: 1 %
Basophils Absolute: 0.1 10*3/uL (ref 0.0–0.1)
Eosinophils Absolute: 0.3 10*3/uL (ref 0.0–0.7)
Eosinophils Relative: 3 %
HCT: 39 % (ref 36.0–46.0)
Hemoglobin: 14.1 g/dL (ref 12.0–15.0)
Lymphocytes Relative: 19 %
Lymphs Abs: 2.1 10*3/uL (ref 0.7–4.0)
MCH: 32.3 pg (ref 26.0–34.0)
MCHC: 36.2 g/dL — ABNORMAL HIGH (ref 30.0–36.0)
MCV: 89.4 fL (ref 78.0–100.0)
MONO ABS: 0.8 10*3/uL (ref 0.1–1.0)
Monocytes Relative: 7 %
NEUTROS ABS: 7.9 10*3/uL — AB (ref 1.7–7.7)
Neutrophils Relative %: 70 %
PLATELETS: 328 10*3/uL (ref 150–400)
RBC: 4.36 MIL/uL (ref 3.87–5.11)
RDW: 12.3 % (ref 11.5–15.5)
WBC: 11.1 10*3/uL — AB (ref 4.0–10.5)

## 2016-01-16 LAB — BASIC METABOLIC PANEL
ANION GAP: 10 (ref 5–15)
BUN: 20 mg/dL (ref 6–20)
CALCIUM: 9.9 mg/dL (ref 8.9–10.3)
CO2: 24 mmol/L (ref 22–32)
Chloride: 104 mmol/L (ref 101–111)
Creatinine, Ser: 1.03 mg/dL — ABNORMAL HIGH (ref 0.44–1.00)
GFR, EST NON AFRICAN AMERICAN: 57 mL/min — AB (ref >60)
Glucose, Bld: 231 mg/dL — ABNORMAL HIGH (ref 65–99)
POTASSIUM: 3.9 mmol/L (ref 3.5–5.1)
Sodium: 138 mmol/L (ref 135–145)

## 2016-01-16 MED ORDER — SODIUM CHLORIDE 0.9 % IV BOLUS (SEPSIS)
1000.0000 mL | Freq: Once | INTRAVENOUS | Status: AC
  Administered 2016-01-16: 1000 mL via INTRAVENOUS

## 2016-01-16 MED ORDER — SULFAMETHOXAZOLE-TRIMETHOPRIM 800-160 MG PO TABS: 1.0000 | ORAL_TABLET | Freq: Two times a day (BID) | ORAL | Status: AC

## 2016-01-16 MED ORDER — VANCOMYCIN HCL IN DEXTROSE 1-5 GM/200ML-% IV SOLN
1000.0000 mg | Freq: Once | INTRAVENOUS | Status: AC
  Administered 2016-01-16: 1000 mg via INTRAVENOUS
  Filled 2016-01-16: qty 200

## 2016-01-16 NOTE — Discharge Instructions (Signed)

## 2016-01-16 NOTE — ED Notes (Signed)
Pt with hx of type 2 diabetes noticed ulcerated area on her left foot 4 days ago. Pt states the pain in her foot is 6/10. Pt states she also noticed her left great toe appears red for the past 5 days. Pt states she has tried using a diabetic foot cream on her foot once a day after showers, which she states has not helped.

## 2016-01-16 NOTE — ED Notes (Signed)
MD at bedside. EDP MILLER PRESENT

## 2016-01-16 NOTE — ED Provider Notes (Signed)
CSN: [id number]     Arrival date & time 01/16/16  1236 History   First MD Initiated Contact with Patient 01/16/16 1543     Chief Complaint  Patient presents with  . Diabetic Ulcer     (Consider location/radiation/quality/duration/timing/severity/associated sxs/prior Treatment) HPI Comments: 4th toe with swelling, color change and drainage for last couple of days.  Clear drainage transitioned to blood drainage - she is now having pain and trouble walking b/c of pain - prior ulcer s/p surgery - subj fevers, no other c/o.  sxa re persistent, gradually worsening.   The history is provided by the patient.    Past Medical History  Diagnosis Date  . Hypertension   . Critical lower limb ischemia   . Hyperlipidemia   . Chest pain   . Left carotid bruit   . PAD (peripheral artery disease) (HCC)   . Type II diabetes mellitus [facility])    Past Surgical History  Procedure Laterality Date  . Peripheral vascular catheterization Bilateral 05/16/2015    Procedure: Lower Extremity Angiography;  Surgeon: Runell Gess, MD; renal arteries widely patent, L-SFA 75%, L-pop 95%, L-peroneal 90%; R-SFA 40%, R-pop 50%, R-prox peroneal 90%; directional atherectomy L-SFA, p-pop, drug-eluting balloon angioplasty reducing the stenoses to 0, small linear dissection was not flow limiting      . Abdominal angiogram  05/16/2015    Procedure: Abdominal Angiogram;  Surgeon: Runell Gess, MD;  Location: [facility] INVASIVE CV LAB;  Service: Cardiovascular;;  . Peripheral vascular catheterization Left 05/16/2015    Procedure: Peripheral Vascular Atherectomy;  Surgeon: Runell Gess, MD;  Location: [facility] INVASIVE CV LAB;  Service: Cardiovascular;  Laterality: Left;  sfa  . Abdominal hysterectomy  ~ 2000   Family History  Problem Relation Age of Onset  . Hypertension Mother    Social History  Substance Use Topics  . Smoking status: Never Smoker   . Smokeless tobacco: Never Used  . Alcohol Use: No   OB History    No  data available     Review of Systems  All other systems reviewed and are negative.     Allergies  Review of patient's allergies indicates no known allergies.  Home Medications   Prior to Admission medications   Medication Sig Start Date End Date Taking? Authorizing Provider  aspirin 81 MG EC tablet Take 1 tablet (81 mg total) by mouth daily. 04/30/15  Yes Quentin Angst, MD  gabapentin (NEURONTIN) 100 MG capsule Take 1 capsule (100 mg total) by mouth 3 (three) times daily. 04/04/15  Yes Quentin Angst, MD  glimepiride (AMARYL) 4 MG tablet Take 1 tablet (4 mg total) by mouth daily with breakfast. 04/04/15  Yes Quentin Angst, MD  ibuprofen (ADVIL,MOTRIN) 200 MG tablet Take 400 mg by mouth every 6 (six) hours as needed (pain/fever).   Yes Historical Provider, MD  insulin NPH-regular Human (NOVOLIN 70/30) (70-30) 100 UNIT/ML injection Inject 16 Units into the skin 2 (two) times daily with a meal. 12/18/15  Yes Quentin Angst, MD  Insulin Pen Needle (PEN NEEDLES 3/16") 31G X 5 MM MISC 1 Stick by Does not apply route 2 (two) times daily at 8 am and 10 pm. 01/14/15  Yes Tora Kindred York, PA-C  losartan (COZAAR) 50 MG tablet Take 1 tablet (50 mg total) by mouth daily. Needs office visit for more refills 12/25/15  Yes Quentin Angst, MD  metFORMIN (GLUCOPHAGE) 1000 MG tablet Take 1 tablet (1,000 mg total) by mouth 2 (two)  times daily with a meal. 04/04/15  Yes Olugbemiga Annitta Needs, MD  metoprolol (LOPRESSOR) 100 MG tablet Take 1 tablet (100 mg total) by mouth 2 (two) times daily. 04/04/15  Yes Quentin Angst, MD  simvastatin (ZOCOR) 40 MG tablet Take 1 tablet (40 mg total) by mouth at bedtime. 04/04/15  Yes Quentin Angst, MD  docusate sodium 100 MG CAPS Take 100 mg by mouth daily. Patient not taking: Reported on 01/16/2016 01/14/15   Stephani Police, PA-C  FLUoxetine (PROZAC) 20 MG tablet Take 1 tablet (20 mg total) by mouth daily. Patient not taking: Reported on 01/16/2016  04/04/15   Quentin Angst, MD  sulfamethoxazole-trimethoprim (BACTRIM DS,SEPTRA DS) 800-160 MG tablet Take 1 tablet by mouth 2 (two) times daily. 01/16/16 01/23/16  Eber Hong, MD   BP 187/75 mmHg  Pulse 77  Temp(Src) 99.2 F (37.3 C) (Oral)  Resp 16  SpO2 98% Physical Exam  Constitutional: She appears well-developed and well-nourished. No distress.  HENT:  Head: Normocephalic and atraumatic.  Mouth/Throat: Oropharynx is clear and moist. No oropharyngeal exudate.  Eyes: Conjunctivae and EOM are normal. Pupils are equal, round, and reactive to light. Right eye exhibits no discharge. Left eye exhibits no discharge. No scleral icterus.  Neck: Normal range of motion. Neck supple. No JVD present. No thyromegaly present.  Cardiovascular: Normal rate, regular rhythm, normal heart sounds and intact distal pulses.  Exam reveals no gallop and no friction rub.   No murmur heard. Pulmonary/Chest: Effort normal and breath sounds normal. No respiratory distress. She has no wheezes. She has no rales.  Abdominal: Soft. Bowel sounds are normal. She exhibits no distension and no mass. There is no tenderness.  Musculoskeletal: Normal range of motion. She exhibits tenderness. She exhibits no edema.  Tenderness over the dorsum of the left foot, there is a superficial ulcer overlying the fourth metatarsal phalangeal joint, the redness is over the dorsal lateral foot, there is also some discoloration of the toe as well as of the great toe. There is no palpable paronychia though it does look like she has an ingrown nail slightly of the great toe.  Lymphadenopathy:    She has no cervical adenopathy.  Neurological: She is alert. Coordination normal.  Skin: Skin is warm and dry. No rash noted. No erythema.  Psychiatric: She has a normal mood and affect. Her behavior is normal.  Nursing note and vitals reviewed.   ED Course  Procedures (including critical care time) Labs Review Labs Reviewed  CBC WITH  DIFFERENTIAL/PLATELET - Abnormal; Notable for the following:    WBC 11.1 (*)    MCHC 36.2 (*)    Neutro Abs 7.9 (*)    All other components within normal limits  BASIC METABOLIC PANEL - Abnormal; Notable for the following:    Glucose, Bld 231 (*)    Creatinine, Ser 1.03 (*)    GFR calc non Af Amer 57 (*)    All other components within normal limits    Imaging Review Dg Foot Complete Left  01/16/2016  CLINICAL DATA:  Left foot ulcer. EXAM: LEFT FOOT - COMPLETE 3+ VIEW COMPARISON:  March 18, 2015. FINDINGS: There is no evidence of fracture or dislocation. There is no evidence of arthropathy or other focal bone abnormality. Vascular calcifications are noted. No lytic destruction is noted. IMPRESSION: No evidence of osteomyelitis. No acute abnormality seen in the left foot. Electronically Signed   By: Lupita Raider, M.D.   On: 01/16/2016 16:59  I have personally reviewed and evaluated these images and lab results as part of my medical decision-making.   MDM   Final diagnoses:  Diabetic foot infection (HCC)    The patient definitely has infected ulcers of the foot, she will need further evaluation with labs, x-ray, antibiotics. Rule out deep tissue infection or osteomyelitis though I suspect this is related to skin infection. Vital signs unremarkable. No true fever  Angiocath insertion Performed by: Eber Hong D  Consent: Verbal consent obtained. Risks and benefits: risks, benefits and alternatives were discussed Time out: Immediately prior to procedure a "time out" was called to verify the correct patient, procedure, equipment, support staff and site/side marked as required.  Preparation: Patient was prepped and draped in the usual sterile fashion.  Vein Location: L AC  Not Ultrasound Guided  Gauge: 20  Normal blood return and flush without difficulty Patient tolerance: Patient tolerated the procedure well with no immediate complications.  In addition to written d/c  instructions, the pt was given verbal d/c instructions including the indications for return and expressed understanding to the instructions.  WBC slightly elevagted - mild hyperglycemia, stable for d/c.  Meds given in ED:  Medications  vancomycin (VANCOCIN) IVPB 1000 mg/200 mL premix (1,000 mg Intravenous New Bag/Given 01/16/16 1848)  sodium chloride 0.9 % bolus 1,000 mL (1,000 mLs Intravenous New Bag/Given 01/16/16 1842)    New Prescriptions   SULFAMETHOXAZOLE-TRIMETHOPRIM (BACTRIM DS,SEPTRA DS) 800-160 MG TABLET    Take 1 tablet by mouth 2 (two) times daily.       Eber Hong, MD 01/16/16 2024

## 2016-01-16 NOTE — Progress Notes (Signed)
EDCM spoke to patient and her daughter at bedside.  Patient's daughter confirms patient is seen by Dr. Hyman Hopes.  Patient obtains her diabetic supplies and medications through the pharmacy at the [facility] without difficulty.  Patient's daughter reports she was told by the doctor who operated on the veins in the patient's legs that the patient would need to come back to them in a year as she would need the surgery again.  Patient's daughter reports she thinks the patient needs to be seen by pcp before she goes back to vascular surgeon.  EDCM encouraged patient's daughter to call the surgeon.  EDCM will send email to [facility] regarding need for possible referral.  Patient and patient's daughter thankful for services.  No further EDCM needs at this time.

## 2016-01-16 NOTE — ED Notes (Signed)
MD at bedside. 

## 2016-01-17 ENCOUNTER — Telehealth: Payer: Self-pay

## 2016-01-17 NOTE — Telephone Encounter (Signed)
This Case Manager received communication from Radford Pax, ED Case Manager indicating patient needing follow-up appointment with Dr. Hyman Hopes. Patient in ED on 01/16/16 for diabetic foot infection. Call placed to #[phone] to discuss scheduling ED follow-up appointment with patient. [address] [place] (Interpreter [phone]) assisted with call. Unable to reach patient at above number; voicemail left requesting return call. In addition, call placed to #[phone]; unable to reach patient. Voicemail left requesting return call.

## 2016-01-20 ENCOUNTER — Emergency Department (HOSPITAL_COMMUNITY)
Admission: EM | Admit: 2016-01-20 | Discharge: 2016-01-20 | Disposition: A | Discharge status: Home / Self Care | Payer: Medicaid Other | Attending: Emergency Medicine | Admitting: Emergency Medicine

## 2016-01-20 ENCOUNTER — Encounter (HOSPITAL_COMMUNITY): Payer: Self-pay | Admitting: Emergency Medicine

## 2016-01-20 DIAGNOSIS — Z7982 Long term (current) use of aspirin: Secondary | ICD-10-CM | POA: Insufficient documentation

## 2016-01-20 DIAGNOSIS — Z794 Long term (current) use of insulin: Secondary | ICD-10-CM | POA: Insufficient documentation

## 2016-01-20 DIAGNOSIS — M79672 Pain in left foot: Secondary | ICD-10-CM | POA: Insufficient documentation

## 2016-01-20 DIAGNOSIS — Z79899 Other long term (current) drug therapy: Secondary | ICD-10-CM | POA: Insufficient documentation

## 2016-01-20 DIAGNOSIS — Z5189 Encounter for other specified aftercare: Secondary | ICD-10-CM

## 2016-01-20 DIAGNOSIS — Z7984 Long term (current) use of oral hypoglycemic drugs: Secondary | ICD-10-CM | POA: Insufficient documentation

## 2016-01-20 DIAGNOSIS — E785 Hyperlipidemia, unspecified: Secondary | ICD-10-CM | POA: Insufficient documentation

## 2016-01-20 DIAGNOSIS — Z4800 Encounter for change or removal of nonsurgical wound dressing: Secondary | ICD-10-CM | POA: Insufficient documentation

## 2016-01-20 DIAGNOSIS — I10 Essential (primary) hypertension: Secondary | ICD-10-CM | POA: Insufficient documentation

## 2016-01-20 DIAGNOSIS — E119 Type 2 diabetes mellitus without complications: Secondary | ICD-10-CM | POA: Insufficient documentation

## 2016-01-20 DIAGNOSIS — Z23 Encounter for immunization: Secondary | ICD-10-CM | POA: Insufficient documentation

## 2016-01-20 DIAGNOSIS — Z9889 Other specified postprocedural states: Secondary | ICD-10-CM | POA: Insufficient documentation

## 2016-01-20 LAB — CBG MONITORING, ED: Glucose-Capillary: 110 mg/dL — ABNORMAL HIGH (ref 65–99)

## 2016-01-20 MED ORDER — TETANUS-DIPHTH-ACELL PERTUSSIS 5-2.5-18.5 LF-MCG/0.5 IM SUSP
0.5000 mL | Freq: Once | INTRAMUSCULAR | Status: AC
  Administered 2016-01-20: 0.5 mL via INTRAMUSCULAR
  Filled 2016-01-20: qty 0.5

## 2016-01-20 NOTE — ED Provider Notes (Signed)
CSN: [id number]     Arrival date & time 01/20/16  1638 History   First MD Initiated Contact with Patient 01/20/16 2145     Chief Complaint  Patient presents with  . Foot Pain     (Consider location/radiation/quality/duration/timing/severity/associated sxs/prior Treatment) HPI   Patient is a 65 year old female past medical history of hypertension, diabetes and PAD who presents to the ED for wound recheck. Patient speaks Spanish per request to have her daughter translate for her at bedside. Patient states she was seen in the ED on 01/16/16 for worsening wound to her left foot. Patient was given IV Vanco in the ED and discharged home with Bactrim. Daughter reports patient has been taking her antibiotics as prescribed. She notes the swelling and redness surrounding the wound have improved since being discharged however she notes the patient has continued to have mild redness to her left fourth toe which has been present for the past week. Daughter reports the wound has been present for the past year. Denies fever, chills, drainage, numbness, tingling, weakness. Daughter reports they have tried to schedule a follow-up with her PCP [facility] but have been unable to schedule an appointment.  Past Medical History  Diagnosis Date  . Hypertension   . Critical lower limb ischemia   . Hyperlipidemia   . Chest pain   . Left carotid bruit   . PAD (peripheral artery disease) (HCC)   . Type II diabetes mellitus [facility])    Past Surgical History  Procedure Laterality Date  . Peripheral vascular catheterization Bilateral 05/16/2015    Procedure: Lower Extremity Angiography;  Surgeon: Runell Gess, MD; renal arteries widely patent, L-SFA 75%, L-pop 95%, L-peroneal 90%; R-SFA 40%, R-pop 50%, R-prox peroneal 90%; directional atherectomy L-SFA, p-pop, drug-eluting balloon angioplasty reducing the stenoses to 0, small linear dissection was not flow limiting      . Abdominal angiogram   05/16/2015    Procedure: Abdominal Angiogram;  Surgeon: Runell Gess, MD;  Location: [facility] INVASIVE CV LAB;  Service: Cardiovascular;;  . Peripheral vascular catheterization Left 05/16/2015    Procedure: Peripheral Vascular Atherectomy;  Surgeon: Runell Gess, MD;  Location: [facility] INVASIVE CV LAB;  Service: Cardiovascular;  Laterality: Left;  sfa  . Abdominal hysterectomy  ~ 2000   Family History  Problem Relation Age of Onset  . Hypertension Mother    Social History  Substance Use Topics  . Smoking status: Never Smoker   . Smokeless tobacco: Never Used  . Alcohol Use: No   OB History    No data available     Review of Systems  Skin: Positive for wound (left foot).  All other systems reviewed and are negative.     Allergies  Review of patient's allergies indicates no known allergies.  Home Medications   Prior to Admission medications   Medication Sig Start Date End Date Taking? Authorizing Provider  aspirin 81 MG EC tablet Take 1 tablet (81 mg total) by mouth daily. 04/30/15  Yes Quentin Angst, MD  gabapentin (NEURONTIN) 100 MG capsule Take 1 capsule (100 mg total) by mouth 3 (three) times daily. 04/04/15  Yes Quentin Angst, MD  glimepiride (AMARYL) 4 MG tablet Take 1 tablet (4 mg total) by mouth daily with breakfast. 04/04/15  Yes Quentin Angst, MD  ibuprofen (ADVIL,MOTRIN) 200 MG tablet Take 400 mg by mouth every 6 (six) hours as needed (pain/fever).   Yes Historical Provider, MD  insulin NPH-regular Human (NOVOLIN  70/30) (70-30) 100 UNIT/ML injection Inject 16 Units into the skin 2 (two) times daily with a meal. 12/18/15  Yes Quentin Angst, MD  Insulin Pen Needle (PEN NEEDLES 3/16") 31G X 5 MM MISC 1 Stick by Does not apply route 2 (two) times daily at 8 am and 10 pm. 01/14/15  Yes Tora Kindred York, PA-C  losartan (COZAAR) 50 MG tablet Take 1 tablet (50 mg total) by mouth daily. Needs office visit for more refills 12/25/15  Yes Quentin Angst, MD    metFORMIN (GLUCOPHAGE) 1000 MG tablet Take 1 tablet (1,000 mg total) by mouth 2 (two) times daily with a meal. 04/04/15  Yes Olugbemiga E Hyman Hopes, MD  metoprolol (LOPRESSOR) 100 MG tablet Take 1 tablet (100 mg total) by mouth 2 (two) times daily. 04/04/15  Yes Quentin Angst, MD  simvastatin (ZOCOR) 40 MG tablet Take 1 tablet (40 mg total) by mouth at bedtime. 04/04/15  Yes Quentin Angst, MD  sulfamethoxazole-trimethoprim (BACTRIM DS,SEPTRA DS) 800-160 MG tablet Take 1 tablet by mouth 2 (two) times daily. 01/16/16 01/23/16 Yes Eber Hong, MD  docusate sodium 100 MG CAPS Take 100 mg by mouth daily. Patient not taking: Reported on 01/16/2016 01/14/15   Stephani Police, PA-C  FLUoxetine (PROZAC) 20 MG tablet Take 1 tablet (20 mg total) by mouth daily. Patient not taking: Reported on 01/16/2016 04/04/15   Quentin Angst, MD   BP 175/91 mmHg  Pulse 75  Temp(Src) 98.7 F (37.1 C) (Oral)  Resp 16  SpO2 98% Physical Exam  Constitutional: She is oriented to person, place, and time. She appears well-developed and well-nourished.  HENT:  Head: Normocephalic and atraumatic.  Mouth/Throat: Oropharynx is clear and moist. No oropharyngeal exudate.  Eyes: Conjunctivae and EOM are normal. Right eye exhibits no discharge. Left eye exhibits no discharge. No scleral icterus.  Neck: Normal range of motion. Neck supple.  Cardiovascular: Normal rate, regular rhythm, normal heart sounds and intact distal pulses.   Pulmonary/Chest: Effort normal and breath sounds normal. No respiratory distress. She has no wheezes. She has no rales. She exhibits no tenderness.  Abdominal: Soft. Bowel sounds are normal. She exhibits no distension and no mass. There is no tenderness. There is no rebound and no guarding.  Musculoskeletal: Normal range of motion.  Mild tenderness to left 4th toe. Wound noted over 4th MTP joint with white discoloration. Mild redness noted at left 4th distal toe with mild surrounding swelling  noted. No other surrounding erythema or drainage noted. Ingrown nail noted to left great toe, no surrounding erythema, swelling or drainage. FROM of left ankle, foot and toes. Sensation grossly intact. 1+ DP pulses.   Lymphadenopathy:    She has no cervical adenopathy.  Neurological: She is alert and oriented to person, place, and time.  Skin: Skin is warm and dry.  Nursing note and vitals reviewed.   ED Course  Procedures (including critical care time) Labs Review Labs Reviewed  CBG MONITORING, ED - Abnormal; Notable for the following:    Glucose-Capillary 110 (*)    All other components within normal limits    Imaging Review No results found. I have personally reviewed and evaluated these images and lab results as part of my medical decision-making.  Filed Vitals:   01/20/16 2257 01/20/16 2350  BP: 175/91   Pulse: 64 75  Temp:  98.7 F (37.1 C)  Resp: 17 16     MDM   Final diagnoses:  Visit for wound check  Patient presents for left foot wound recheck. Daughter reports patient was seen in the ED 01/16/16 for left foot wound. Chart review shows that patient was given IV Vanco in the ED, negative foot x-ray, no signs of osteomyelitis, patient was discharged home with Bactrim. Denies fever. VSS. Exam revealed wound to dorsal aspect of left foot with mild surrounding erythema and minimal swelling. 1+ DP pulses, cap refill <2, sensation intact. Patient's daughter showed me a picture of the patient's foot on her phone that was taken while she was in the ED during her prior visit. Wound appears to have improved with improvement of her cellulitis which was present during that time.  Discussed pt with Dr. Ethelda Chick who pt. Tdap updated in the ED. Spoke with case manager who will attempt to give patient an appointment at [facility] in the next few days for recheck. Advised daughter to continue contacting the clinic in order to confirm a follow-up appointment. Due  to patient having elevated blood pressure in the ED today and no signs of hypertensive urgency advised patient to follow up with her PCP in the next week for a blood pressure recheck.      Satira Sark [place], [place] 01/21/16 0136  Doug Sou, MD 01/21/16 1016

## 2016-01-20 NOTE — ED Provider Notes (Signed)
History is obtained from professional medical interpreter using [place] language line Patient with chronic wound on left foot for approximately one year. For the past week foot has been reddened and painful. She came to the emergency department on 01/16/2016 was treated with intravenous ankle mycin and prescribed Bactrim. Pain is improved and redness has improved. She denies any fever. She was told to present if she thought the foot might be getting worse, and she was unsure. Patient unable to follow up with [facility], her PMD due to there being full. On exam patient is alert and nontoxic. Left lower extremity chronic appearing wound of the dorsum of the foot. Surrounding area somewhat reddened nontender. Minimal soft tissue swelling. DP pulses absent bilaterally however there is good capillary refill to toes . Patient's daughter showed me a picture on her telephone from several days ago. Foot does look improved and clinically patient is improved. I contacted case manager who will attempt to get patient into [facility] within the next few days. For recheck. She may benefit from wound clinic. T dap updated  Doug Sou, MD 01/20/16 2259

## 2016-01-20 NOTE — Discharge Instructions (Signed)
Continue taking your antibiotic prescription as prescribed until it is completed. I recommend continuing to contact her primary care provider at the [facility] to schedule a follow-up appointment. I also recommend contacting your foot doctor to schedule a follow up appointment regarding your foot wound. I advise you to follow up with your primary care provider in 1 week for a blood pressure recheck due to your pressure being elevated (175/91) in the ED today. Please return to the Emergency Department if symptoms worsen or new onset of fever, headache, chest pain, shortness of breath, cough, abdominal pain, vomiting, blood in vomit/urine/stool, redness, swelling, drainage, numbness, tingling, weakness.

## 2016-01-20 NOTE — ED Notes (Signed)
Pt states that she is a diabetic and was seen on Thursday for the wound on her L foot. Was told to come in if it got worse. Wound has changed colors and seems to be progressing. Alert and oriented.

## 2016-01-30 MED FILL — NOVOLIN 70/30 100 UNITS/ML: (70-30) 100 | 30 days supply | Qty: 10 | Fill #5

## 2016-01-30 MED FILL — GABAPENTIN 100 MG CAPSULE: 100 | 30 days supply | Qty: 90 | Fill #7

## 2016-01-30 MED FILL — SIMVASTATIN 40 MG TABLET: 40 | 30 days supply | Qty: 30 | Fill #8

## 2016-01-30 MED FILL — GLIMEPIRIDE 4 MG TABLET: 4 | 30 days supply | Qty: 30 | Fill #1

## 2016-01-30 MED FILL — ?METOPROLOL 100 MG TABLET: 100 | 30 days supply | Qty: 60 | Fill #7

## 2016-01-30 MED FILL — metFORMIN HCL 1000 MG TABS: 1000 | 30 days supply | Qty: 60 | Fill #7

## 2016-02-06 ENCOUNTER — Ambulatory Visit: Payer: Medicaid Other | Attending: Internal Medicine | Admitting: Internal Medicine

## 2016-02-06 ENCOUNTER — Encounter: Payer: Self-pay | Admitting: Internal Medicine

## 2016-02-06 VITALS — HR 68 | Temp 98.4°F | Resp 18 | Ht 62.0 in | Wt 149.2 lb

## 2016-02-06 DIAGNOSIS — Z79899 Other long term (current) drug therapy: Secondary | ICD-10-CM | POA: Insufficient documentation

## 2016-02-06 DIAGNOSIS — E1142 Type 2 diabetes mellitus with diabetic polyneuropathy: Secondary | ICD-10-CM | POA: Insufficient documentation

## 2016-02-06 DIAGNOSIS — I739 Peripheral vascular disease, unspecified: Secondary | ICD-10-CM

## 2016-02-06 DIAGNOSIS — E785 Hyperlipidemia, unspecified: Secondary | ICD-10-CM | POA: Insufficient documentation

## 2016-02-06 DIAGNOSIS — Z23 Encounter for immunization: Secondary | ICD-10-CM | POA: Insufficient documentation

## 2016-02-06 DIAGNOSIS — Z0000 Encounter for general adult medical examination without abnormal findings: Secondary | ICD-10-CM | POA: Insufficient documentation

## 2016-02-06 DIAGNOSIS — F32A Depression, unspecified: Secondary | ICD-10-CM

## 2016-02-06 DIAGNOSIS — E11621 Type 2 diabetes mellitus with foot ulcer: Secondary | ICD-10-CM | POA: Insufficient documentation

## 2016-02-06 DIAGNOSIS — S91109D Unspecified open wound of unspecified toe(s) without damage to nail, subsequent encounter: Secondary | ICD-10-CM | POA: Insufficient documentation

## 2016-02-06 DIAGNOSIS — Z794 Long term (current) use of insulin: Secondary | ICD-10-CM | POA: Insufficient documentation

## 2016-02-06 DIAGNOSIS — I10 Essential (primary) hypertension: Secondary | ICD-10-CM | POA: Diagnosis not present

## 2016-02-06 DIAGNOSIS — E118 Type 2 diabetes mellitus with unspecified complications: Secondary | ICD-10-CM | POA: Insufficient documentation

## 2016-02-06 DIAGNOSIS — L089 Local infection of the skin and subcutaneous tissue, unspecified: Secondary | ICD-10-CM | POA: Insufficient documentation

## 2016-02-06 DIAGNOSIS — L97529 Non-pressure chronic ulcer of other part of left foot with unspecified severity: Secondary | ICD-10-CM

## 2016-02-06 DIAGNOSIS — Z7982 Long term (current) use of aspirin: Secondary | ICD-10-CM | POA: Insufficient documentation

## 2016-02-06 DIAGNOSIS — F329 Major depressive disorder, single episode, unspecified: Secondary | ICD-10-CM | POA: Insufficient documentation

## 2016-02-06 LAB — POCT GLYCOSYLATED HEMOGLOBIN (HGB A1C): Hemoglobin A1C: 10.3

## 2016-02-06 LAB — GLUCOSE, POCT (MANUAL RESULT ENTRY): POC GLUCOSE: 276 mg/dL — AB (ref 70–99)

## 2016-02-06 MED ORDER — INSULIN NPH ISOPHANE & REGULAR (70-30) 100 UNIT/ML ~~LOC~~ SUSP: 16.0000 [IU] | Freq: Two times a day (BID) | SUBCUTANEOUS | Status: DC

## 2016-02-06 MED ORDER — GABAPENTIN 100 MG PO CAPS: 100.0000 mg | ORAL_CAPSULE | Freq: Three times a day (TID) | ORAL | Status: DC

## 2016-02-06 MED ORDER — METOPROLOL TARTRATE 100 MG PO TABS: 100.0000 mg | ORAL_TABLET | Freq: Two times a day (BID) | ORAL | Status: DC

## 2016-02-06 MED ORDER — GLIMEPIRIDE 4 MG PO TABS
4.0000 mg | ORAL_TABLET | Freq: Every day | ORAL | Status: DC
Start: 2016-02-06 — End: 2016-12-23

## 2016-02-06 MED ORDER — LOSARTAN POTASSIUM 50 MG PO TABS: 50.0000 mg | ORAL_TABLET | Freq: Every day | ORAL | Status: DC

## 2016-02-06 MED ORDER — SIMVASTATIN 40 MG PO TABS
40.0000 mg | ORAL_TABLET | Freq: Every day | ORAL | Status: DC
Start: 2016-02-06 — End: 2016-12-23

## 2016-02-06 MED ORDER — METFORMIN HCL 1000 MG PO TABS: 1000.0000 mg | ORAL_TABLET | Freq: Two times a day (BID) | ORAL | Status: DC

## 2016-02-06 MED ORDER — DOXYCYCLINE HYCLATE 100 MG PO TABS: 100.0000 mg | ORAL_TABLET | Freq: Two times a day (BID) | ORAL | Status: DC

## 2016-02-06 MED ORDER — FLUOXETINE HCL 20 MG PO TABS: 20.0000 mg | ORAL_TABLET | Freq: Every day | ORAL | Status: DC

## 2016-02-06 MED FILL — SIMVASTATIN 40 MG TABLET: 40 | 30 days supply | Qty: 30 | Fill #0

## 2016-02-06 MED FILL — ?DOXYCYCLINE 100 MG TABLET: 100 | 10 days supply | Qty: 20 | Fill #0

## 2016-02-06 MED FILL — ?FLUOXETINE HCL 20MG TABLET: 20 | 30 days supply | Qty: 30 | Fill #0

## 2016-02-06 MED FILL — LOSARTAN POTASSIUM 50 MG TA: 50 | 30 days supply | Qty: 30 | Fill #0

## 2016-02-06 NOTE — Progress Notes (Signed)
Samantha [name], is a 65 y.o. female  VWU:[id number]  GNF:[id number]  DOB - Jun 25, 1951  CC:  Chief Complaint  Patient presents with  . Wound Check    Left foot       HPI: Samantha [name] is a 65 y.o. female here today to for follow up visit from ED. The Spanish interpreter line was used for this visit. 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 Patient presents with elevated CBG of 276 and A1C of 10.3 today. Last A1C 7.30 on 02/25/15. Patient is noncompliant with her diabetic medication regime. Admits not taking Novolin 70/30 in evening, medication is prescribed twice a day. Patient's chronic left 4th toe ulcer is (4cm length x 1.5cm width x 1cm at its deepest point) and appears to be covered in slough. Patient has no fever and minimal tenderness to end of toe. Patient was seen in the ED on 01/20/2016 and 01/16/2016 for toe ulcer and was prescribed antibiotic and given Tdap booster. Patient completed antibiotic regime. Patient requesting refills of all medications. No headache, No chest pain, No abdominal pain - No Nausea, No Cough - SOB.  No Known Allergies Past Medical History  Diagnosis Date  . Hypertension   . Critical lower limb ischemia   . Hyperlipidemia   . Chest pain   . Left carotid bruit   . PAD (peripheral artery disease) (HCC)   . Type II diabetes mellitus (HCC)    Current Outpatient Prescriptions on File Prior to Visit  Medication Sig Dispense Refill  . aspirin 81 MG EC tablet Take 1 tablet (81 mg total) by mouth daily. 30 tablet 3  . ibuprofen (ADVIL,MOTRIN) 200 MG tablet Take 400 mg by mouth every 6 (six) hours as needed (pain/fever).    . Insulin Pen Needle (PEN NEEDLES 3/16") 31G X 5 MM MISC 1 Stick by Does not apply route 2 (two) times daily at 8 am and 10 pm. 100 each 12  . docusate sodium 100 MG CAPS Take 100 mg by mouth daily. (Patient not taking: Reported on 01/16/2016) 10 capsule 0   No current  facility-administered medications on file prior to visit.   Family History  Problem Relation Age of Onset  . Hypertension Mother    Social History   Social History  . Marital Status: Widowed    Spouse Name: N/A  . Number of Children: N/A  . Years of Education: N/A   Occupational History  . Not on file.   Social History Main Topics  . Smoking status: Never Smoker   . Smokeless tobacco: Never Used  . Alcohol Use: No  . Drug Use: No  . Sexual Activity: No   Other Topics Concern  . Not on file   Social History Narrative    Review of Systems: Constitutional: Negative for fever, chills, diaphoresis, activity change, appetite change and fatigue. HENT: Negative for ear pain, nosebleeds, congestion, facial swelling, rhinorrhea, neck pain, neck stiffness and ear discharge.  Eyes: Negative for pain, discharge, redness, itching and visual disturbance. Respiratory: Negative for cough, choking, chest tightness, shortness of breath, wheezing and stridor.  Cardiovascular: Negative for chest pain, palpitations and leg swelling. Gastrointestinal: Negative for abdominal distention. Genitourinary: Negative for dysuria, urgency, frequency, hematuria, flank pain, decreased urine volume, difficulty urinating and dyspareunia.  Musculoskeletal: Negative for back pain, joint swelling, arthralgia and gait problem. Neurological: Negative for dizziness, tremors, seizures, syncope, facial asymmetry, speech difficulty, weakness, light-headedness, numbness and headaches.  Hematological:  Negative for adenopathy. Does not bruise/bleed easily. Psychiatric/Behavioral: Negative for hallucinations, behavioral problems, confusion, dysphoric mood, decreased concentration and agitation.    Objective:   Filed Vitals:   02/06/16 1011  Pulse: 68  Temp: 98.4 F (36.9 C)  Resp: 18    Physical Exam: Constitutional: Patient appears well-developed and well-nourished. No distress.  Eyes: Conjunctivae and EOM  are normal. PERRLA, no scleral icterus. Neck: Normal ROM. Neck supple. No JVD. No tracheal deviation. No thyromegaly. CVS: RRR, S1/S2 +, no murmurs, no gallops, no carotid bruit.  Pulmonary: Effort and breath sounds normal, no stridor, rhonchi, wheezes, rales.  Abdominal: Soft. BS +, no distension, tenderness, rebound or guarding.  Lymphadenopathy: No lymphadenopathy noted. Neuro: Alert. Normal reflexes, muscle tone coordination. No cranial nerve deficit. Skin: Skin is warm and dry. No rash noted. Not diaphoretic. Patient has chronic skin ulcer on Left 4th toe (4cm length X 1.5 cm width X 1cm in depth) Psychiatric: Normal mood and affect. Behavior, judgment, thought content normal.  Lab Results  Component Value Date   WBC 11.1* 01/16/2016   HGB 14.1 01/16/2016   HCT 39.0 01/16/2016   MCV 89.4 01/16/2016   PLT 328 01/16/2016   Lab Results  Component Value Date   CREATININE 1.03* 01/16/2016   BUN 20 01/16/2016   NA 138 01/16/2016   K 3.9 01/16/2016   CL 104 01/16/2016   CO2 24 01/16/2016    Lab Results  Component Value Date   HGBA1C 10.30 02/06/2016   Lipid Panel     Component Value Date/Time   CHOL 179 01/15/2015 1032   TRIG 251* 01/15/2015 1032   HDL 35* 01/15/2015 1032   CHOLHDL 5.1 01/15/2015 1032   VLDL 50* 01/15/2015 1032   LDLCALC 94 01/15/2015 1032       Assessment and plan:   Samantha [name] was seen today for wound check.  Diagnoses and all orders for this visit:  Type 2 diabetes mellitus with diabetic polyneuropathy, with long-term current use of insulin (HCC) -     POCT A1C -     Glucose (CBG) -     Microalbumin/Creatinine Ratio, Urine  Aim for 30 minutes of exercise most days. Rethink what you drink.  Water is great! Aim for 2-3 Carb Choices per meal (30-45 grams) +/- 1 either way   Aim for 0-15 Carbs per snack if hungry   Include protein in moderation with your meals and snacks   Consider reading food labels for Total Carbohydrate and Fat Grams of foods     Consider checking BG at alternate times per day   Continue taking medication as directed Be mindful about how much sugar you are adding to beverages and other foods.  Fruit Punch - find one with no sugar   Measure and decrease portions of carbohydrate foods   Make your plate and don't go back for seconds  Healthcare maintenance -     Flu Vaccine QUAD 36+ mos PF IM (Fluarix & Fluzone Quad PF)  Essential hypertension -     metoprolol (LOPRESSOR) 100 MG tablet; Take 1 tablet (100 mg total) by mouth 2 (two) times daily. -     losartan (COZAAR) 50 MG tablet; Take 1 tablet (50 mg total) by mouth daily.  We have discussed target BP range and blood pressure goal - I have advised patient to check BP regularly and to call us back or report to clinic if the numbers are consistently higher than 140/90   - We discussed the importance  of compliance with medical therapy and DASH diet recommended, consequences of uncontrolled hypertension discussed.   - continue current BP medications  Diabetic ulcer of left foot associated with type 2 diabetes mellitus (HCC) -     doxycycline (VIBRA-TABS) 100 MG tablet; Take 1 tablet (100 mg total) by mouth 2 (two) times daily. -     Ambulatory referral to Podiatry Patient was shown proper way to clean and dress ulcer. Ulcer was dressed by Student NP. Patient verbalized understanding of proper care of ulcer and was given supplies to use before her next visit.. Clean wound with soap and water daily, dress with gauze and non-adhesive tape.  Only use clean socks, that have been washed in hot water with soap.  PAD (peripheral artery disease) (HCC) -     simvastatin (ZOCOR) 40 MG tablet; Take 1 tablet (40 mg total) by mouth at bedtime.  Type 2 diabetes mellitus with complication, with long-term current use of insulin (HCC) -     metFORMIN (GLUCOPHAGE) 1000 MG tablet; Take 1 tablet (1,000 mg total) by mouth 2 (two) times daily with a meal. -     insulin NPH-regular Human  (NOVOLIN 70/30) (70-30) 100 UNIT/ML injection; Inject 16 Units into the skin 2 (two) times daily with a meal. -     glimepiride (AMARYL) 4 MG tablet; Take 1 tablet (4 mg total) by mouth daily with breakfast. -     gabapentin (NEURONTIN) 100 MG capsule; Take 1 capsule (100 mg total) by mouth 3 (three) times daily. -     Ambulatory referral to Ophthalmology Patient was counseled extensively on the importance of taking her insulin and oral diabetic medicines. Patient was explained the possible consequences of continued extremely elevated CBG and A1C.  Patient verbalized understanding.  Diabetes Aim for 30 minutes of exercise most days. Rethink what you drink.  Water is great! Aim for 2-3 Carb Choices per meal (30-45 grams) +/- 1 either way   Aim for 0-15 Carbs per snack if hungry   Include protein in moderation with your meals and snacks   Consider reading food labels for Total Carbohydrate and Fat Grams of foods   Consider checking BG at alternate times per day   Continue taking medication as directed Be mindful about how much sugar you are adding to beverages and other foods.  Fruit Punch - find one with no sugar   Measure and decrease portions of carbohydrate foods   Make your plate and don't go back for seconds  Depression -     FLUoxetine (PROZAC) 20 MG tablet; Take 1 tablet (20 mg total) by mouth daily.   The patient was given medication administration instructions in SPANISH.  Return in about 1 week (around 02/13/2016) for Diabetic Foot, Follow up HTN, Follow up Pain and comorbidities.  The patient was given clear instructions to go to ER or return to medical center if symptoms don't improve, worsen or new problems develop. The patient verbalized understanding.  Stephanie Coup, AGNP-Student [facility] [phone] 02/06/2016, 11:04 AM   Evaluation and management procedures were performed by the Advanced Practitioner under my supervision and collaboration. I  have reviewed the Advanced Practitioner's note and chart, and I agree with the management and plan.   Jeanann Lewandowsky, MD, MHA, CPE, FACP, FAAP [facility] [place], [place] [phone]   02/10/2016, 6:09 PM

## 2016-02-06 NOTE — Progress Notes (Signed)
Patient is here for DM foot infection.  Patient complains of boils being present on left foot for the past 3 weeks. Patient was seen in the ED during the month of Jan for similar concern. Patient states the boils continue to return. Pain is scaled currently at a 6 being described as a throbbing pain.  Patient would like the flu shot today. Patient tolerated flu shot well.

## 2016-02-06 NOTE — Patient Instructions (Addendum)
Instrucciones de administracin de medicamentos para el cuidado de las heridas y medicamentos para diabticos   Paciente para tomar todos los medicamentos segn lo prescrito: Insulina Novolin 70/30 - 16 unidades a inyectar maana y noche Metformina 1000 mg - dos veces al da Glimipiride  - 1 tableta una vez al [employer]. Tome azcar en la sangre al [employer] veces al da por la maana y por la noche y registre en el registro de Banker. Regrese a la oficina el jueves 02/13/16 para chequeo de herida y chequeo de Production assistant, radio. Limpiar la herida cada da y vestirse con gasa limpia y [place].    Plan de alimentacin DASH (DASH Eating Plan) DASH es la sigla en ingls de "Enfoques Alimentarios para Detener la Hipertensin". El plan de alimentacin DASH ha demostrado bajar la presin arterial elevada (hipertensin). Los beneficios adicionales para la salud pueden incluir la disminucin del riesgo de diabetes mellitus tipo2, enfermedades cardacas e ictus. Este plan tambin puede ayudar a Geophysical data processor. QU DEBO SABER ACERCA DEL PLAN DE ALIMENTACIN DASH? Para el plan de alimentacin DASH, seguir las siguientes pautas generales:  Elija los alimentos con un valor porcentual diario de sodio de menos del 5% (segn figura en la etiqueta del alimento).  Use hierbas o aderezos sin sal, en lugar de sal de mesa o sal marina.  Consulte al mdico o farmacutico antes de usar sustitutos de la sal.  Coma productos con bajo contenido de sodio, cuya etiqueta suele decir "bajo contenido de sodio" o "sin agregado de sal".  Coma alimentos frescos.  Coma ms verduras, frutas y productos lcteos con bajo contenido de [place].  Elija los cereales integrales. Busque la palabra "integral" en Estate agent de la lista de ingredientes.  Elija el pescado y el pollo o el pavo sin piel ms a menudo que las carnes rojas. Limite el consumo de pescado, carne de ave y carne a 6onzas (170g) por  Futures trader.  Limite el consumo de dulces, postres, azcares y bebidas azucaradas.  Elija las grasas saludables para el corazn.  Limite el consumo de queso a 1onza (28g) por Futures trader.  Consuma ms comida casera y menos de restaurante, de buf y comida rpida.  Limite el consumo de alimentos fritos.  Cocine los alimentos utilizando mtodos que no sean la fritura.  Limite las verduras enlatadas. Si las consume, enjuguelas bien para disminuir el sodio.  Cuando coma en un restaurante, pida que preparen su comida con menos sal o, en lo posible, sin nada de sal. QU ALIMENTOS PUEDO COMER? Pida ayuda a un nutricionista para conocer las necesidades calricas individuales. Cereales Pan de salvado o integral. Arroz integral. Pastas de salvado o integrales. Quinua, trigo burgol y cereales integrales. Cereales con bajo contenido de sodio. Tortillas de harina de maz o de salvado. Pan de maz integral. Galletas saladas integrales. Galletas con bajo contenido de [place]. Vegetales Verduras frescas o congeladas (crudas, al vapor, asadas o grilladas). Jugos de tomate y verduras con contenido bajo o reducido de sodio. Pasta y salsa de tomate con contenido bajo o reducido de sodio. Verduras enlatadas con bajo contenido de sodio o reducido de sodio.  Nils Pyle Nils Pyle frescas, en conserva (en su jugo natural) o frutas congeladas. Carnes y otros productos con protenas Carne de res molida (al 85% o ms [place]), carne de res de animales alimentados con pastos o carne de res sin la grasa. Pollo o pavo sin piel. Carne de pollo o de [place]. Cerdo sin la  grasaWinfield Cunas los pescados y frutos de mar. Huevos. Porotos, guisantes o lentejas secos. Frutos secos y semillas sin sal. Frijoles enlatados sin sal. Lcteos Productos lcteos con bajo contenido de grasas, como [place] o al 1%, quesos reducidos en grasas o al 2%, ricota con bajo contenido de grasas o [employer], o yogur natural con bajo contenido de [place].  Quesos con contenido bajo o reducido de sodio. Grasas y Writer en barra que no contengan grasas trans. Mayonesa y alios para ensaladas livianos o reducidos en grasas (reducidos en sodio). [place]. Aceites de crtamo, oliva o canola. Mantequilla natural de man o almendra. Otros Palomitas de maz y pretzels sin sal. Los artculos mencionados arriba pueden no ser [employer]theon de las bebidas o los alimentos recomendados. Comunquese con el nutricionista para conocer ms opciones. QU ALIMENTOS NO SE RECOMIENDAN? Cereales Pan blanco. Pastas blancas. Arroz blanco. Pan de maz refinado. Bagels y croissants. Galletas saladas que contengan grasas trans. Vegetales Vegetales con crema o fritos. Verduras en salsa de [place]. Verduras enlatadas comunes. Pasta y salsa de tomate en lata comunes. Jugos comunes de tomate y de verduras. Nils Pyle Frutas secas. Fruta enlatada en almbar liviano o espeso. Jugo de frutas. Carnes y otros productos con protenas Cortes de carne con Holiday representative. Costillas, alas de pollo, tocineta, salchicha, mortadela, salame, chinchulines, tocino, perros calientes, salchichas alemanas y embutidos envasados. Frutos secos y semillas con sal. Frijoles con sal en lata. Lcteos Leche entera o al 2%, crema, mezcla de [place] y crema, y queso crema. Yogur entero o endulzado. Quesos o queso azul con alto contenido de Neurosurgeon. [place] no lcteas y coberturas batidas. Quesos procesados, quesos para untar o cuajadas. Condimentos Sal de cebolla y ajo, sal condimentada, sal de mesa y sal marina. Salsas en lata y envasadas. Salsa [place]. Salsa trtara. Salsa barbacoa. Salsa teriyaki. Salsa de soja, incluso la que tiene contenido reducido de [place]. Salsa de carne. Salsa de pescado. Salsa de Orestes. Salsa rosada. Rbano picante. Ketchup y mostaza. Saborizantes y tiernizantes para carne. Caldo en cubitos. Salsa picante. Salsa tabasco. Adobos. Aderezos para tacos. Salsas. Grasas y  [address], [place] en barra, [place] de [place], [place], [place] clarificada y Steffanie [place] de tocino. Aceites de coco, de palmiste o de palma. Aderezos comunes para ensalada. Otros Pickles y [place]. Palomitas de maz y pretzels con sal. Los artculos mencionados arriba pueden no ser [employer]on de las bebidas y los alimentos que se Theatre stage manager. Comunquese con el nutricionista para obtener ms informacin. DNDE Samantha Mora MS INFORMACIN? Instituto Nacional del [place], del Pulmn y de Risk manager (National Heart, Lung, and Blood Institute): [email or web address]   Esta informacin no tiene Theme park manager el consejo del mdico. Asegrese de hacerle al mdico cualquier pregunta que tenga.   Document Released: 12/03/2011 Document Revised: 01/04/2015 Elsevier Interactive Patient Education 2016 [employer]. Hipertensin (Hypertension) La hipertensin, conocida comnmente como presin arterial alta, se produce cuando la sangre bombea en las arterias con mucha fuerza. Las arterias son los vasos sanguneos que transportan la sangre desde el corazn hacia todas las partes del cuerpo. Una lectura de la presin arterial consiste en un nmero ms alto sobre un nmero ms bajo, por ejemplo, 110/72. El nmero ms alto (presin sistlica) corresponde a la presin interna de las arterias cuando el corazn [place]. El nmero ms bajo (presin diastlica) corresponde a la presin interna de las arterias cuando el corazn se relaja. En condiciones ideales, la presin arterial debe ser inferior a 120/80. La hipertensin fuerza  al corazn a trabajar ms para Marine scientist. Las arterias pueden estrecharse o ponerse rgidas. La hipertensin no tratada o no controlada puede causar infarto de miocardio, ictus, enfermedad renal y otros problemas. FACTORES DE RIESGO Algunos factores de riesgo de hipertensin son controlables, pero otros no lo son.  Entre  los factores de riesgo que usted no puede Chief Operating Officer, se [employer] siguientes:   La raza. El riesgo es mayor para las Statistician.  La edad. Los riesgos aumentan con la edad.  El sexo. Antes de los 45aos, los hombres corren ms [employer]. Despus de los 65aos, las mujeres corren ms [employer]. Entre los factores de riesgo que usted puede Chief Operating Officer, se [employer] siguientes:  No hacer la cantidad suficiente de actividad fsica o ejercicio.  Tener sobrepeso.  Consumir mucha grasa, azcar, caloras o sal en la dieta.  Beber alcohol en exceso. SIGNOS Y SNTOMAS Por lo general, la hipertensin no causa signos o sntomas. La hipertensin arterial demasiado alta (crisis hipertensiva) puede causar dolor de cabeza, ansiedad, falta de aire y hemorragia nasal. DIAGNSTICO Para detectar si usted tiene hipertensin, el mdico le medir la presin arterial mientras est sentado, con el brazo levantado a la altura del corazn. Debe medirla al [facility] veces en el mismo brazo. Determinadas condiciones pueden causar una diferencia de presin arterial entre el brazo izquierdo y Aeronautical engineer. El hecho de tener una sola lectura de la presin arterial ms alta que lo normal no significa que Research scientist (physical sciences). Si no est claro si tiene hipertensin arterial, es posible que se le pida que regrese otro da para volver a controlarle la presin arterial. O bien se le puede pedir que se controle la presin arterial en su casa durante 1 o ms meses. TRATAMIENTO El tratamiento de la hipertensin arterial incluye hacer cambios en el estilo de vida y, posiblemente, tomar medicamentos. Un estilo de vida saludable puede ayudar a bajar la presin arterial alta. Quiz deba cambiar algunos hbitos. Los cambios en el estilo de vida pueden incluir lo siguiente:  Seguir la dieta DASH. Esta dieta tiene un alto contenido de frutas, verduras y Radiation protection practitioner. Incluye poca  cantidad de sal, carnes rojas y azcares agregados.  Mantenga el consumo de sodio por debajo de 2300 mg por da.  Realizar al [employer] 30 y 45 minutos de ejercicio [place], 4 veces por semana como mnimo.  Perder peso, si es necesario.  No fumar.  Limitar el consumo de bebidas alcohlicas.  Aprender formas de reducir el estrs. El mdico puede recetarle medicamentos si los cambios en el estilo de vida no son suficientes para Museum/gallery curator la presin arterial y si una de las siguientes afirmaciones es verdadera:  Cleone Slim 18 y 74 aos y su presin arterial sistlica est por encima de 140.  Tiene 60 aos o ms y su presin arterial sistlica est por encima de 150.  Su presin arterial diastlica est por encima de 90.  Tiene diabetes y su presin arterial sistlica est por encima de 140 o su presin arterial diastlica est por encima de 90.  Tiene una enfermedad renal y su presin arterial est por encima de 140/90.  Tiene una enfermedad cardaca y su presin arterial est por encima de 140/90. La presin arterial deseada puede variar en funcin de las enfermedades, la edad y otros factores personales. INSTRUCCIONES PARA EL CUIDADO EN EL HOGAR  Haga que le midan de nuevo la presin arterial segn las indicaciones  del mdico.  Tome los medicamentos solamente como se lo haya indicado el mdico. Siga cuidadosamente las indicaciones. Los medicamentos para la presin arterial deben tomarse segn las indicaciones. Los medicamentos pierden eficacia al omitir las dosis. El hecho de omitir las dosis tambin [place] el riesgo de otros problemas.  No fume.  Contrlese la presin arterial en su casa segn las indicaciones del mdico. SOLICITE ATENCIN MDICA SI:   Piensa que tiene una reaccin alrgica a los medicamentos.  Tiene mareos o dolores de cabeza con Naval architect.  Tiene hinchazn en los tobillos.  Tiene problemas de visin. SOLICITE ATENCIN MDICA DE INMEDIATO  SI:  Siente un dolor de cabeza intenso o confusin.  Siente debilidad inusual, adormecimiento o que Hospital doctor.  Siente dolor intenso en el pecho o en el abdomen.  Vomita repetidas veces.  Tiene dificultad para respirar. ASEGRESE DE QUE:   Comprende estas instrucciones.  Controlar su afeccin.  Recibir ayuda de inmediato si no mejora o si empeora.   Esta informacin no tiene Theme park manager el consejo del mdico. Asegrese de hacerle al mdico cualquier pregunta que tenga.   Document Released: 12/14/2005 Document Revised: 04/30/2015 Elsevier Interactive Patient Education Yahoo[employer] Diabetes y actividad fsica (Diabetes and Exercise) Hacer actividad fsica con regularidad es muy importante. No se trata solo de [employer]. Tiene muchos otros beneficios, como por ejemplo:  Mejorar el estado fsico, la flexibilidad y la resistencia.  Aumenta la densidad sea.  Ayuda a Art gallery manager.  Disminuye la Art gallery manager.  Aumenta la fuerza muscular.  Reduce el estrs y las tensiones.  Mejora el estado de salud general. Las personas diabticas que realizan actividad fsica tienen beneficios adicionales debido al ejercicio:  Reduce el apetito.  El organismo mejora el uso del azcar (glucosa) de la [place].  Ayuda a disminuir o Engineer, maintenance (IT).  Disminuye la presin arterial.  Ayuda a disminuir los lpidos en la sangre (colesterol y triglicridos).  El organismo mejora el uso de la insulina porque:  Aumenta la sensibilidad del organismo a la insulina.  Reduce las necesidades de insulina del organismo.  Disminuye el riesgo de enfermedad cardaca por la actividad fsica ya que  disminuye el colesterol y [employer] triglicridos.  Aumenta los niveles de colesterol bueno (como las lipoprotenas de alta densidad [HDL]) en el organismo.  Disminuye los niveles de glucosa en la [place]. SU PLAN DE ACTIVIDAD  Elija una actividad que  disfrute y establezca objetivos realistas. Para ejercitarse sin riesgos, debe comenzar a Education administrator cualquier actividad fsica nueva lentamente y aumentar la intensidad del ejercicio de forma gradual con el tiempo. Su mdico o educador en diabetes podrn ayudarlo a crear un plan de actividades que lo beneficie. Las recomendaciones generales incluyen lo siguiente:  Air cabin crew a los nios para que realicen al menos 60 minutos de actividad fsica Management consultant.  Estirarse y Education officer, environmental ejercicios de entrenamiento de la fuerza, como yoga o levantamiento de pesas, por lo menos 2 veces por semana.  Realizar en total por lo menos 150 minutos de ejercicios de intensidad moderada cada semana, como caminar a paso ligero o hacer gimnasia acutica.  Hacer ejercicio fsico por lo menos 3 das por semana y no dejar pasar ms de 2 das seguidos sin ejercitarse.  Evitar los perodos largos de inactividad (90 minutos o ms tiempo). Cuando deba pasar mucho tiempo sentado, haga pausas frecuentes para caminar o estirarse. RECOMENDACIONES PARA REALIZAR EJERCICIOS CUANDO SE TIENE DIABETES TIPO 1 O TIPO 2  Controle la glucosa en la sangre antes de comenzar. Si el nivel de glucosa en la sangre es de ms de 240 mg/dl, controle las cetonas en la [place]. No haga actividad fsica si hay cetonas.  Evite inyectarse insulina en las zonas del cuerpo que ejercitar. Por ejemplo, evite inyectarse insulina en:  Los brazos, si juega al tenis.  Las piernas, si corre.  Lleve un registro de:  Los alimentos que consume antes y despus de Tour manager.  Los momentos esperables de picos de accin de la insulina.  Los niveles de glucosa en la sangre antes y despus de hacer ejercicios.  El tipo y cantidad de [place] fsica que Biomedical engineer.  Revise los registros con su mdico. El mdico lo ayudar a Environmental education officer pautas para ajustar la cantidad de alimento y las cantidades de insulina antes y despus de Radio producer ejercicios.  Si toma  insulina o agentes hipoglucemiantes por va oral, observe si hay signos y sntomas de hipoglucemia. Entre los que se incluyen:  Mareos.  Temblores.  Sudoracin.  Escalofros.  Confusin.  Beba gran cantidad de agua mientras hace ejercicios para evitar la deshidratacin o los golpes de Airline pilot. Durante la actividad fsica se pierde agua corporal que se debe reponer.  Comente con su mdico antes de comenzar un programa de actividad fsica para verificar que sea seguro para usted. Recuerde, cualquier actividad es mejor que ninguna.   Esta informacin no tiene Theme park manager el consejo del mdico. Asegrese de hacerle al mdico cualquier pregunta que tenga.   Document Released: 01/03/2008 Document Revised: 04/30/2015 Elsevier Interactive Patient Education Yahoo! Inc. Diabetes y cuidados del pie (Diabetes and Foot Care) La diabetes puede ser la causa de que el flujo sanguneo (circulacin) en las piernas y los pies sea deficiente. Debido a esto, la piel de los pies se torna ms delgada, se rompe con facilidad y se cura ms lentamente. La piel puede estar seca, despellejarse y Lobbyist. Tambin pueden estar daados los nervios de las piernas y de los pies lo que provoca una disminucin de la sensibilidad. Es posible que no advierta heridas ms pequeas en los pies, que pueden causar infecciones graves. Cuidar sus pies es una de las cosas ms importantes que puede hacer por usted mismo.  INSTRUCCIONES PARA EL CUIDADO EN EL HOGAR  Use siempre calzado, an dentro de su casa. No camine descalzo. Caminar descalzo facilita que se lastime.  Controle sus pies diariamente para observar ampollas, cortes y enrojecimiento. Si no puede ver la planta del pie, use un espejo o pdale ayuda a Engineer, maintenance (IT).  Lave sus pies con agua tibia (no use agua caliente) y un [place]. Seque bien sus pies, y la zona [employer] dedos dando [place], hasta que estn completamente secos. Noremoje los pies, ya  que esto puede resecar la piel.  Aplique una locin hidratante o vaselina (que no contenga alcohol ni perfume) en los pies y en las uas secas y [place]. No aplique locin entre los dedos.  Recorte las uas en forma recta. No escarbe debajo de las uas o alrededor [employer]. Lime los bordes de las uas con una lima o esmeril.  No corte las durezas o callosidades, ni trate de quitarlas con medicamentos.  Use calcetines de algodn o medias [place] [employer]. Asegrese de que no le PACC[employer] Inc. Nouse calcetines que le lleguen a las rodillas, ya que podran disminuir el flujo de sangre a las piernas.  Use zapatos de cuero que le queden  bien y que sean acolchados. Para amoldar los zapatos, clcelos slo algunas horas por da. Esto evitar lesiones en los pies. Revise siempre los zapatos antes de ponerlos para asegurarse de que no haya objetos en su interior.  No cruce las piernas. Esto puede disminuir el flujo de sangre a los pies.  Si algo le ha raspado, cortado o lastimado la piel de los pies, mantenga la piel de esa zona limpia y [place]. Debe higienizar estas zonas con agua y un jabn suave. No limpie la zona con agua oxigenada, alcohol ni yodo.  Cuando se quite un vendaje adhesivo, asegrese de no daar la piel.  Si tiene una herida, obsrvela varias veces por da para asegurarse de que se est curando.  No use bolsas de agua caliente ni almohadillas trmicas. Podran causar quemaduras. Si ha perdido la sensibilidad en los pies o las piernas, no sabr lo que le est sucediendo hasta que sea demasiado tarde.  Asegrese de que su mdico le haga un examen completo de los pies por lo menos una vez al ao, o con ms frecuencia si usted tiene [employer] Rx. Informe todos los cortes, llagas o moretones a su mdico inmediatamente. SOLICITE ATENCIN MDICA SI:   Tiene una lesin que no se cura.  Tiene cortes o rajaduras en la piel.  Tiene una ua encarnada.  Nota una  zona irritada en las piernas o los pies.  Siente una sensacin de ardor u hormigueo en las piernas o los pies.  Siente dolor o calambres en las piernas o los pies.  Las piernas o los pies estn adormecidos.  Siente los pies siempre fros. SOLICITE ATENCIN MDICA DE INMEDIATO SI:   Presenta enrojecimiento, hinchazn o aumento del dolor en una herida.  Nota una lnea roja que sube por pierna.  Aparece pus en la herida.  Le sube la fiebre o segn lo que le indique el mdico.  Advierte un olor ftido que proviene de una lcera o una herida.   Esta informacin no tiene Theme park manager el consejo del mdico. Asegrese de hacerle al mdico cualquier pregunta que tenga.   Document Released: 12/14/2005 Document Revised: 08/16/2013 Elsevier Interactive Patient Education 2016 [employer]. Recuento bsico de carbohidratos para la diabetes mellitus (Basic Carbohydrate Counting for Diabetes Mellitus) El recuento de carbohidratos es un mtodo destinado a calcular la cantidad de carbohidratos en la dieta. El consumo de carbohidratos aumenta naturalmente el nivel de azcar (glucosa) en la sangre, por lo que es importante que sepa la cantidad que debe incluir en cada comida. El recuento de carbohidratos ayuda a Futures trader de glucosa en la sangre dentro de los lmites normales. La cantidad permitida de carbohidratos es diferente para cada persona. Un nutricionista puede ayudarlo a calcular la cantidad adecuada para usted. Una vez que sepa la cantidad de carbohidratos que puede consumir, podr calcular los carbohidratos de los alimentos que desea comer. Los siguientes alimentos incluyen carbohidratos:  Granos, como panes y cereales.  Frijoles secos y productos con soja.  Vegetales almidonados, como papas, guisantes y maz.  Nils Pyle y jugos de frutas.  Leche y Dentist.  Dulces y bocadillos, como pastel, galletas, caramelos, papas fritas de bolsa, refrescos y bebidas frutales con  azcar. RECUENTO DE CARBOHIDRATOS [employer] de calcular los carbohidratos de los alimentos. Puede usar cualquiera de [address] o [place] combinacin de [place]. Leer la etiqueta de informacin nutricional de los alimentos envasados La informacin nutricional es una etiqueta incluida en casi todas las  bebidas y los alimentos envasados de los [place]. Indica el tamao de la porcin de ese alimento o bebida e informacin sobre los nutrientes de cada porcin, incluso los gramos (g) de carbohidratos por porcin.  Decida la cantidad de porciones que comer o tomar de este alimento o bebida. Multiplique la cantidad de porciones por el nmero de gramos de carbohidratos indicados en la etiqueta para esa porcin. El total ser la cantidad de carbohidratos que consumir al comer ese alimento o tomar esa bebida. Conocer las porciones estndar de los alimentos Cuando coma alimentos no envasados o que no incluyan la informacin nutricional en la etiqueta, deber medir las porciones para poder calcular la cantidad de carbohidratos. Una porcin de la mayora de los alimentos ricos en carbohidratos contiene alrededor de 15g de carbohidratos. La siguiente [employer] tamaos de porcin de los alimentos ricos en carbohidratos que contienen alrededor de 15g de carbohidratos por porcin:   1rebanada de pan (1oz) o 1tortilla de seis pulgadas.  panecillo de hamburguesa o bollito tipo ingls.  4a 6galletas.   de taza de cereal sin azcar y seco.   taza de cereal caliente.   de taza de arroz o pastas.  taza de pur de papas o de una papa grande al horno.  1taza de frutas frescas o una fruta pequea.  taza de frutas o jugo de frutas enlatados o congelados.  1 taza [employer].   de taza de yogur descremado sin ningn agregado o de yogur endulzado con edulcorante artificial.  taza de vegetales almidonados, como guisantes, maz o papas, o de frijoles secos cocidos. Decida la  cantidad de porciones Advertising copywriter. Multiplique la cantidad de porciones por 15 (los gramos de carbohidratos en esa porcin). Por ejemplo, si come 2tazas de fresas, habr comido 2porciones y 30g de carbohidratos (2porciones x 15g = 30g). Para las comidas como sopas y guisos, en las que se mezcla ms de un alimento, deber [place] carbohidratos de cada alimento incluido. EJEMPLO DE RECUENTO DE CARBOHIDRATOS Ejemplo de cena  3 onzas de pechugas de pollo.   de taza de arroz integral.   taza de maz.  1 taza de [place].  1 taza de fresas con crema batida sin azcar. Clculo de carbohidratos [place] 1: Identifique los alimentos que contienen carbohidratos:   Arroz.  Maz.  Leche.  Jinny Sanders. [place] 2: Calcule el nmero de porciones que consumir de cada uno:   2 porciones de Surveyor, minerals.  1 porcin de maz.  1 porcin de leche.  1 porcin de fresas. [place] 3: Multiplique cada una de esas porciones por 15g:   2 porciones de arroz x 15 g = 30 g.  1 porcin de maz x 15 g = 15 g.  1 porcin de leche x 15 g = 15 g.  1 porcin de fresas x 15 g = 15 g. Paso 4: Sume todas las cantidades para Artist total de gramos de carbohidratos consumidos: 30 g + 15 g + 15 g + 15 g = 75 g.   Esta informacin no tiene Theme park manager el consejo del mdico. Asegrese de hacerle al mdico cualquier pregunta que tenga.   Document Released: 03/07/2012 Document Revised: 01/04/2015 Elsevier Interactive Patient Education [employer]

## 2016-02-07 LAB — MICROALBUMIN / CREATININE URINE RATIO
CREATININE, URINE: 90 mg/dL (ref 20–320)
MICROALB/CREAT RATIO: 1876 ug/mg{creat} — AB (ref <30)
Microalb, Ur: 168.8 mg/dL

## 2016-02-12 ENCOUNTER — Encounter: Payer: Self-pay | Admitting: Internal Medicine

## 2016-02-12 ENCOUNTER — Ambulatory Visit: Payer: Medicaid Other | Attending: Internal Medicine | Admitting: Internal Medicine

## 2016-02-12 VITALS — BP 162/76 | HR 62 | Temp 97.9°F | Resp 16 | Ht 62.0 in | Wt 158.0 lb

## 2016-02-12 DIAGNOSIS — Z7982 Long term (current) use of aspirin: Secondary | ICD-10-CM | POA: Insufficient documentation

## 2016-02-12 DIAGNOSIS — L97509 Non-pressure chronic ulcer of other part of unspecified foot with unspecified severity: Secondary | ICD-10-CM | POA: Insufficient documentation

## 2016-02-12 DIAGNOSIS — I739 Peripheral vascular disease, unspecified: Secondary | ICD-10-CM | POA: Insufficient documentation

## 2016-02-12 DIAGNOSIS — Z794 Long term (current) use of insulin: Secondary | ICD-10-CM

## 2016-02-12 DIAGNOSIS — E119 Type 2 diabetes mellitus without complications: Secondary | ICD-10-CM

## 2016-02-12 DIAGNOSIS — E785 Hyperlipidemia, unspecified: Secondary | ICD-10-CM | POA: Insufficient documentation

## 2016-02-12 DIAGNOSIS — Z79899 Other long term (current) drug therapy: Secondary | ICD-10-CM | POA: Insufficient documentation

## 2016-02-12 DIAGNOSIS — I10 Essential (primary) hypertension: Secondary | ICD-10-CM | POA: Insufficient documentation

## 2016-02-12 DIAGNOSIS — E118 Type 2 diabetes mellitus with unspecified complications: Secondary | ICD-10-CM

## 2016-02-12 DIAGNOSIS — E11621 Type 2 diabetes mellitus with foot ulcer: Secondary | ICD-10-CM

## 2016-02-12 DIAGNOSIS — L97501 Non-pressure chronic ulcer of other part of unspecified foot limited to breakdown of skin: Secondary | ICD-10-CM

## 2016-02-12 DIAGNOSIS — Z7984 Long term (current) use of oral hypoglycemic drugs: Secondary | ICD-10-CM | POA: Insufficient documentation

## 2016-02-12 DIAGNOSIS — Z9889 Other specified postprocedural states: Secondary | ICD-10-CM | POA: Insufficient documentation

## 2016-02-12 LAB — GLUCOSE, POCT (MANUAL RESULT ENTRY): POC GLUCOSE: 123 mg/dL — AB (ref 70–99)

## 2016-02-12 NOTE — Patient Instructions (Signed)
Schedule a visit with cardiology this week (tomorrow is best)-- diabetic foot ulcer.  Schedule appt with wound clinid  Instructions for wound care provided to patient via interpreter

## 2016-02-12 NOTE — Progress Notes (Signed)
Patient's here for f/up wound check on left foot. Patient denies pain today.  She reports taking medications this morning.  Patient has no further concerns.

## 2016-02-12 NOTE — Progress Notes (Signed)
Patient ID: Samantha [name], female   DOB: Dec 25, 1951, 65 y.o.   MRN: [id number]  Patient comes in for follow-up of foot ulcer. I reviewed note from last week. I also reviewed her history. Patient has a history of lower extremity limb ischemia. She has had peripheral intervention previously. She now comes in and states that she has had a stable ulcer for several months. This history is obtained from the patient via the Spanish interpreter. Patient denies that the ulcer has changed. She denies any significant pain. No erythema, no significant drainage.  The patient has a long history of diabetes. She states that she takes her medications but she can't remember any of them. I am very unclear as to whether she's taking medications.  Past Medical History  Diagnosis Date  . Hypertension   . Critical lower limb ischemia   . Hyperlipidemia   . Chest pain   . Left carotid bruit   . PAD (peripheral artery disease) (HCC)   . Type II diabetes mellitus (HCC)     Social History   Social History  . Marital Status: Widowed    Spouse Name: N/A  . Number of Children: N/A  . Years of Education: N/A   Occupational History  . Not on file.   Social History Main Topics  . Smoking status: Never Smoker   . Smokeless tobacco: Never Used  . Alcohol Use: No  . Drug Use: No  . Sexual Activity: No   Other Topics Concern  . Not on file   Social History Narrative    Past Surgical History  Procedure Laterality Date  . Peripheral vascular catheterization Bilateral 05/16/2015    Procedure: Lower Extremity Angiography;  Surgeon: Runell Gess, MD; renal arteries widely patent, L-SFA 75%, L-pop 95%, L-peroneal 90%; R-SFA 40%, R-pop 50%, R-prox peroneal 90%; directional atherectomy L-SFA, p-pop, drug-eluting balloon angioplasty reducing the stenoses to 0, small linear dissection was not flow limiting      . Abdominal angiogram  05/16/2015    Procedure: Abdominal Angiogram;  Surgeon: Runell Gess, MD;   Location: [facility] INVASIVE CV LAB;  Service: Cardiovascular;;  . Peripheral vascular catheterization Left 05/16/2015    Procedure: Peripheral Vascular Atherectomy;  Surgeon: Runell Gess, MD;  Location: [facility] [place] INVASIVE CV LAB;  Service: Cardiovascular;  Laterality: Left;  sfa  . Abdominal hysterectomy  ~ 2000    Family History  Problem Relation Age of Onset  . Hypertension Mother     No Known Allergies  Current Outpatient Prescriptions on File Prior to Visit  Medication Sig Dispense Refill  . aspirin 81 MG EC tablet Take 1 tablet (81 mg total) by mouth daily. 30 tablet 3  . doxycycline (VIBRA-TABS) 100 MG tablet Take 1 tablet (100 mg total) by mouth 2 (two) times daily. 20 tablet 0  . FLUoxetine (PROZAC) 20 MG tablet Take 1 tablet (20 mg total) by mouth daily. 90 tablet 3  . gabapentin (NEURONTIN) 100 MG capsule Take 1 capsule (100 mg total) by mouth 3 (three) times daily. 270 capsule 3  . glimepiride (AMARYL) 4 MG tablet Take 1 tablet (4 mg total) by mouth daily with breakfast. 90 tablet 3  . ibuprofen (ADVIL,MOTRIN) 200 MG tablet Take 400 mg by mouth every 6 (six) hours as needed (pain/fever).    . insulin NPH-regular Human (NOVOLIN 70/30) (70-30) 100 UNIT/ML injection Inject 16 Units into the skin 2 (two) times daily with a meal. 30 mL 3  . Insulin Pen Needle (PEN NEEDLES 3/16")  31G X 5 MM MISC 1 Stick by Does not apply route 2 (two) times daily at 8 am and 10 pm. 100 each 12  . losartan (COZAAR) 50 MG tablet Take 1 tablet (50 mg total) by mouth daily. 90 tablet 3  . metFORMIN (GLUCOPHAGE) 1000 MG tablet Take 1 tablet (1,000 mg total) by mouth 2 (two) times daily with a meal. 180 tablet 3  . metoprolol (LOPRESSOR) 100 MG tablet Take 1 tablet (100 mg total) by mouth 2 (two) times daily. 180 tablet 3  . simvastatin (ZOCOR) 40 MG tablet Take 1 tablet (40 mg total) by mouth at bedtime. 90 tablet 3  . docusate sodium 100 MG CAPS Take 100 mg by mouth daily. (Patient not taking: Reported on  01/16/2016) 10 capsule 0   No current facility-administered medications on file prior to visit.     patient denies chest pain, shortness of breath, orthopnea. Denies lower extremity edema, abdominal pain, change in appetite, change in bowel movements. Patient denies rashes, musculoskeletal complaints. No other specific complaints in a complete review of systems.   BP 162/76 mmHg  Pulse 62  Temp(Src) 97.9 F (36.6 C) (Oral)  Resp 16  Ht  (1.575 m)  Wt 158 lb (71.668 kg)  BMI 28.89 kg/m2  SpO2 98% Recheck blood pressure 136/83. Well-developed well-nourished female in no acute distress. Neck is supple without lymphadenopathy, thyromegaly or carotid bruits.  Chest is clear to auscultation without any increased work of breathing.  Cardiac exam S1 and S2 are regular. She does have a 2/6 systolic ejection murmur. Abdominal exam active bowel sounds, soft. Extremities no clubbing cyanosis or edema. Peripheral pulses are intact on the right and absent on the left foot. She has a 2.2 x 1 cm ulcer overlying her left fourth toe. No bleeding, erythema or drainage noted.  Ap Type 2 diabetes mellitus with complication, with long-term current use of insulin (HCC) Complicated patient. Does not appear that she has had appropriate laboratories in quite some time. I'll order an A1c, basic metabolic profile, liver profile tests and lipid panel. She needs an appointment specifically addressing her diabetes. I'll make that follow-up in 1-2 weeks. In addition she needs urgent evaluation of her diabetic foot ulcer. She has critical limb ischemia. I'll have her see the peripheral vascular specialist in cardiology division. I'll also have her see wound clinic. In the meantime I've given her specific instructions of how to care for the wound. She will keep the wound covered with an ointment and gauze. She'll change the wound daily. She voiced understanding of these instructions via the Spanish interpreter.  When she  comes back she will bring all of the medications that she is taking.

## 2016-02-12 NOTE — Assessment & Plan Note (Signed)
Complicated patient. Does not appear that she has had appropriate laboratories in quite some time. I'll order an A1c, basic metabolic profile, liver profile tests and lipid panel. She needs an appointment specifically addressing her diabetes. I'll make that follow-up in 1-2 weeks. In addition she needs urgent evaluation of her diabetic foot ulcer. She has critical limb ischemia. I'll have her see the peripheral vascular specialist in cardiology division. I'll also have her see wound clinic. In the meantime I've given her specific instructions of how to care for the wound. She will keep the wound covered with an ointment and gauze. She'll change the wound daily. She voiced understanding of these instructions via the Spanish interpreter.  When she comes back she will bring all of the medications that she is taking.

## 2016-02-13 ENCOUNTER — Ambulatory Visit: Payer: Medicaid Other | Admitting: Internal Medicine

## 2016-02-13 LAB — LIPID PANEL
CHOL/HDL RATIO: 3.2 ratio (ref <=5.0)
CHOLESTEROL: 120 mg/dL — AB (ref 125–200)
HDL: 37 mg/dL — AB (ref >=46)
LDL Cholesterol: 56 mg/dL (ref <130)
Triglycerides: 137 mg/dL (ref <150)
VLDL: 27 mg/dL (ref <30)

## 2016-02-13 LAB — MICROALBUMIN / CREATININE URINE RATIO
CREATININE, URINE: 57 mg/dL (ref 20–320)
Microalb Creat Ratio: 1433 mcg/mg creat — ABNORMAL HIGH (ref <30)
Microalb, Ur: 81.7 mg/dL

## 2016-02-13 LAB — HEPATIC FUNCTION PANEL
ALK PHOS: 71 U/L (ref 33–130)
ALT: 22 U/L (ref 6–29)
AST: 25 U/L (ref 10–35)
Albumin: 3.7 g/dL (ref 3.6–5.1)
BILIRUBIN DIRECT: 0.1 mg/dL (ref <=0.2)
BILIRUBIN TOTAL: 0.3 mg/dL (ref 0.2–1.2)
Indirect Bilirubin: 0.2 mg/dL (ref 0.2–1.2)
Total Protein: 6.5 g/dL (ref 6.1–8.1)

## 2016-02-13 LAB — BASIC METABOLIC PANEL
BUN: 19 mg/dL (ref 7–25)
CHLORIDE: 103 mmol/L (ref 98–110)
CO2: 24 mmol/L (ref 20–31)
Calcium: 9.3 mg/dL (ref 8.6–10.4)
Creat: 1.09 mg/dL — ABNORMAL HIGH (ref 0.50–0.99)
GLUCOSE: 129 mg/dL — AB (ref 65–99)
POTASSIUM: 4.4 mmol/L (ref 3.5–5.3)
SODIUM: 138 mmol/L (ref 135–146)

## 2016-02-14 ENCOUNTER — Encounter (HOSPITAL_BASED_OUTPATIENT_CLINIC_OR_DEPARTMENT_OTHER): Payer: Medicaid Other | Attending: Internal Medicine

## 2016-02-14 DIAGNOSIS — E1140 Type 2 diabetes mellitus with diabetic neuropathy, unspecified: Secondary | ICD-10-CM | POA: Insufficient documentation

## 2016-02-14 DIAGNOSIS — E11621 Type 2 diabetes mellitus with foot ulcer: Secondary | ICD-10-CM | POA: Diagnosis not present

## 2016-02-14 DIAGNOSIS — L97521 Non-pressure chronic ulcer of other part of left foot limited to breakdown of skin: Secondary | ICD-10-CM | POA: Insufficient documentation

## 2016-02-14 DIAGNOSIS — Z794 Long term (current) use of insulin: Secondary | ICD-10-CM | POA: Insufficient documentation

## 2016-02-14 DIAGNOSIS — E1151 Type 2 diabetes mellitus with diabetic peripheral angiopathy without gangrene: Secondary | ICD-10-CM | POA: Insufficient documentation

## 2016-02-14 DIAGNOSIS — I10 Essential (primary) hypertension: Secondary | ICD-10-CM | POA: Insufficient documentation

## 2016-02-14 DIAGNOSIS — E1165 Type 2 diabetes mellitus with hyperglycemia: Secondary | ICD-10-CM | POA: Diagnosis not present

## 2016-02-17 ENCOUNTER — Ambulatory Visit: Payer: Medicaid Other | Attending: Internal Medicine

## 2016-02-21 DIAGNOSIS — E11621 Type 2 diabetes mellitus with foot ulcer: Secondary | ICD-10-CM | POA: Diagnosis not present

## 2016-02-26 ENCOUNTER — Ambulatory Visit: Payer: Medicaid Other | Attending: Internal Medicine | Admitting: Internal Medicine

## 2016-02-26 ENCOUNTER — Encounter: Payer: Self-pay | Admitting: Internal Medicine

## 2016-02-26 VITALS — BP 169/80 | HR 77 | Temp 98.5°F | Resp 16 | Ht 62.0 in | Wt 148.2 lb

## 2016-02-26 DIAGNOSIS — E11621 Type 2 diabetes mellitus with foot ulcer: Secondary | ICD-10-CM | POA: Insufficient documentation

## 2016-02-26 DIAGNOSIS — L97501 Non-pressure chronic ulcer of other part of unspecified foot limited to breakdown of skin: Secondary | ICD-10-CM

## 2016-02-26 NOTE — Progress Notes (Signed)
Here for DM ulcer f/u Patient was seen a couple of weeks ago. Diabetic wound is improving. She is keeping it covered. She was unable to keep cardiology appointment.  On exam she appears well. The ulcer on her foot is significantly smaller. Length is now probably 1 cm with his approximate 5 mm.  Assessment and plan diabetic foot ulcer improving. Continue current therapy. I've encouraged her to keep her cardiology appointment.  She will come back to follow-up on diabetes. She likely needs new therapy.

## 2016-02-26 NOTE — Progress Notes (Signed)
Patient is her for foot ulcer. Patient denies pain today.  Patient has no further questions or concerns.

## 2016-02-27 ENCOUNTER — Encounter: Payer: Self-pay | Admitting: Internal Medicine

## 2016-02-27 ENCOUNTER — Ambulatory Visit: Payer: Medicaid Other | Admitting: Internal Medicine

## 2016-02-28 ENCOUNTER — Encounter (HOSPITAL_BASED_OUTPATIENT_CLINIC_OR_DEPARTMENT_OTHER): Payer: Medicaid Other | Attending: Internal Medicine

## 2016-02-28 DIAGNOSIS — L97521 Non-pressure chronic ulcer of other part of left foot limited to breakdown of skin: Secondary | ICD-10-CM | POA: Insufficient documentation

## 2016-02-28 DIAGNOSIS — E1140 Type 2 diabetes mellitus with diabetic neuropathy, unspecified: Secondary | ICD-10-CM | POA: Insufficient documentation

## 2016-02-28 DIAGNOSIS — E1151 Type 2 diabetes mellitus with diabetic peripheral angiopathy without gangrene: Secondary | ICD-10-CM | POA: Insufficient documentation

## 2016-02-28 DIAGNOSIS — E11621 Type 2 diabetes mellitus with foot ulcer: Secondary | ICD-10-CM | POA: Insufficient documentation

## 2016-02-28 DIAGNOSIS — I10 Essential (primary) hypertension: Secondary | ICD-10-CM | POA: Insufficient documentation

## 2016-03-04 ENCOUNTER — Other Ambulatory Visit: Payer: Self-pay | Admitting: Internal Medicine

## 2016-03-04 MED FILL — GABAPENTIN 100 MG CAPSULE: 100 | 90 days supply | Qty: 270 | Fill #0

## 2016-03-04 MED FILL — GLIMEPIRIDE 4 MG TABLET: 4 | 30 days supply | Qty: 30 | Fill #2

## 2016-03-04 MED FILL — SIMVASTATIN 40 MG TABLET: 40 | 30 days supply | Qty: 30 | Fill #9

## 2016-03-04 MED FILL — LOSARTAN POTASSIUM 50 MG TA: 50 | 30 days supply | Qty: 30 | Fill #1

## 2016-03-04 MED FILL — ?FLUOXETINE HCL 20MG TABLET: 20 | 30 days supply | Qty: 30 | Fill #1

## 2016-03-04 MED FILL — ?METFORMIN HCL 1,000 MG TAB: 1000 | 30 days supply | Qty: 60 | Fill #8

## 2016-03-04 MED FILL — ?METOPROLOL 100 MG TABLET: 100 | 30 days supply | Qty: 60 | Fill #8

## 2016-03-12 ENCOUNTER — Ambulatory Visit: Payer: Medicaid Other | Admitting: Internal Medicine

## 2016-03-30 ENCOUNTER — Ambulatory Visit: Payer: Medicaid Other | Attending: Podiatry | Admitting: Podiatry

## 2016-03-30 DIAGNOSIS — Z87898 Personal history of other specified conditions: Secondary | ICD-10-CM

## 2016-03-30 DIAGNOSIS — E1149 Type 2 diabetes mellitus with other diabetic neurological complication: Secondary | ICD-10-CM

## 2016-03-30 NOTE — Progress Notes (Signed)
Subjective:     Patient ID: Samantha [name], female   DOB: April 14, 1951, 65 y.o.   MRN: [id number]  HPI 65 year old female presents to the office for diabetic risk assessment. She states she previously had ulcerations on her left foot, but she has been undergoing treatment and the wounds have healed. Currently denies any open sores. No drainage. The swelling to the left foot has come down a lot since she had the wounds. No warmth to the foot. No other complaints.   Review of Systems  All other systems reviewed and are negative.      Objective:   Physical Exam General: AAO x3, NAD  Dermatological: Evidence of healed ulceration to the distal hallux, 4th toe, and dorsal 4th MTPJ. No open lesion is identified at this time. No significant hyperkerotic tissue. No other open lesions or pre-ulcerative lesions.   Vascular: Dorsalis Pedis artery and Posterior Tibial artery pedal pulses are 1/4 bilateral with immedate capillary fill time. Pedal hair growth present. No varicosities and no lower extremity edema present bilateral. There is no pain with calf compression, swelling, warmth, erythema.   Neruologic: Sensation decreased with SWMF.  Musculoskeletal: Hammertoes present. No gross boney pedal deformities bilateral. No pain, crepitus, or limitation noted with foot and ankle range of motion bilateral. Muscular strength 5/5 in all groups tested bilateral.  Gait: Unassisted, Nonantalgic.      Assessment:     Presents for diabetic risk assessment; history of ulceration     Plan:     -Treatment options discussed including all alternatives, risks, and complications -At this time, there is no evidence of open ulcer. Continue to monitor daily. -Discussed daily foot inspection -Glucose control -Follow-up in 3 months or sooner if any problems arise. In the meantime, encouraged to call the office with any questions, concerns, change in symptoms.   Ovid CurdMatthew Sly Parlee, DPM

## 2016-04-14 ENCOUNTER — Other Ambulatory Visit: Payer: Self-pay | Admitting: Internal Medicine

## 2016-04-14 MED FILL — SIMVASTATIN 40 MG TABLET: 40 | 30 days supply | Qty: 30 | Fill #1

## 2016-04-14 MED FILL — ?FLUOXETINE HCL 20MG TABLET: 20 | 30 days supply | Qty: 30 | Fill #2

## 2016-04-14 MED FILL — ?METFORMIN HCL 1,000 MG TAB: 1000 | 30 days supply | Qty: 60 | Fill #0

## 2016-04-14 MED FILL — GLIMEPIRIDE 4 MG TABLET: 4 | 30 days supply | Qty: 30 | Fill #0

## 2016-04-14 MED FILL — LOSARTAN POTASSIUM 50 MG TA: 50 | 30 days supply | Qty: 30 | Fill #2

## 2016-04-14 MED FILL — NOVOLIN 70/30 100 UNITS/ML: (70-30) 100 | 30 days supply | Qty: 10 | Fill #0

## 2016-04-28 ENCOUNTER — Encounter (HOSPITAL_COMMUNITY): Payer: Self-pay

## 2016-05-12 MED FILL — LOSARTAN POTASSIUM 50 MG TA: 50 | 30 days supply | Qty: 30 | Fill #3

## 2016-05-12 MED FILL — ?METFORMIN HCL 1,000 MG TAB: 1000 | 30 days supply | Qty: 60 | Fill #1

## 2016-05-12 MED FILL — GABAPENTIN 100 MG CAPSULE: 100 | 90 days supply | Qty: 270 | Fill #1

## 2016-05-12 MED FILL — METOPROLOL TARTRATE 100 MG: 100 | 90 days supply | Qty: 180 | Fill #0

## 2016-05-12 MED FILL — FLUoxetine HCL 20 MG CAPS: 20 | 30 days supply | Qty: 30 | Fill #0

## 2016-05-12 MED FILL — SIMVASTATIN 40 MG TABLET: 40 | 30 days supply | Qty: 30 | Fill #2

## 2016-05-12 MED FILL — GLIMEPIRIDE 4 MG TABLET: 4 | 30 days supply | Qty: 30 | Fill #1

## 2016-06-10 ENCOUNTER — Other Ambulatory Visit: Payer: Self-pay | Admitting: Family Medicine

## 2016-06-10 ENCOUNTER — Encounter: Payer: Self-pay | Admitting: Family Medicine

## 2016-06-10 ENCOUNTER — Ambulatory Visit: Payer: Self-pay | Attending: Family Medicine | Admitting: Family Medicine

## 2016-06-10 VITALS — BP 181/84 | HR 63 | Temp 98.0°F | Resp 14 | Ht 62.5 in | Wt 149.4 lb

## 2016-06-10 DIAGNOSIS — F32A Depression, unspecified: Secondary | ICD-10-CM

## 2016-06-10 DIAGNOSIS — Z794 Long term (current) use of insulin: Secondary | ICD-10-CM | POA: Insufficient documentation

## 2016-06-10 DIAGNOSIS — Z7984 Long term (current) use of oral hypoglycemic drugs: Secondary | ICD-10-CM | POA: Insufficient documentation

## 2016-06-10 DIAGNOSIS — E1169 Type 2 diabetes mellitus with other specified complication: Secondary | ICD-10-CM | POA: Insufficient documentation

## 2016-06-10 DIAGNOSIS — Z7982 Long term (current) use of aspirin: Secondary | ICD-10-CM | POA: Insufficient documentation

## 2016-06-10 DIAGNOSIS — I739 Peripheral vascular disease, unspecified: Secondary | ICD-10-CM | POA: Insufficient documentation

## 2016-06-10 DIAGNOSIS — Z79899 Other long term (current) drug therapy: Secondary | ICD-10-CM | POA: Insufficient documentation

## 2016-06-10 DIAGNOSIS — E118 Type 2 diabetes mellitus with unspecified complications: Secondary | ICD-10-CM

## 2016-06-10 DIAGNOSIS — R8290 Unspecified abnormal findings in urine: Secondary | ICD-10-CM

## 2016-06-10 DIAGNOSIS — F329 Major depressive disorder, single episode, unspecified: Secondary | ICD-10-CM | POA: Insufficient documentation

## 2016-06-10 DIAGNOSIS — I10 Essential (primary) hypertension: Secondary | ICD-10-CM | POA: Insufficient documentation

## 2016-06-10 DIAGNOSIS — E785 Hyperlipidemia, unspecified: Secondary | ICD-10-CM | POA: Insufficient documentation

## 2016-06-10 LAB — POCT URINALYSIS DIPSTICK
Glucose, UA: 100
Ketones, UA: NEGATIVE
LEUKOCYTES UA: NEGATIVE
NITRITE UA: NEGATIVE
PH UA: 7.5
PROTEIN UA: 300
UROBILINOGEN UA: 0.2

## 2016-06-10 LAB — POCT GLYCOSYLATED HEMOGLOBIN (HGB A1C): Hemoglobin A1C: 8.2

## 2016-06-10 LAB — GLUCOSE, POCT (MANUAL RESULT ENTRY): POC GLUCOSE: 226 mg/dL — AB (ref 70–99)

## 2016-06-10 MED ORDER — VALSARTAN-HYDROCHLOROTHIAZIDE 160-12.5 MG PO TABS
1.0000 | ORAL_TABLET | Freq: Every day | ORAL | Status: DC
  Administered 2016-06-10: 1 via ORAL

## 2016-06-10 MED ORDER — LOSARTAN POTASSIUM 100 MG PO TABS: 100.0000 mg | ORAL_TABLET | Freq: Every day | ORAL | Status: DC

## 2016-06-10 MED ORDER — INSULIN NPH ISOPHANE & REGULAR (70-30) 100 UNIT/ML ~~LOC~~ SUSP: 12.0000 [IU] | Freq: Two times a day (BID) | SUBCUTANEOUS | Status: DC

## 2016-06-10 MED FILL — ?LOSARTAN POTASSIUM 100 MG: 100 | 30 days supply | Qty: 30 | Fill #0

## 2016-06-10 MED FILL — !NOVOLIN 70/30 100 UNITS/ML: (70-30) 100 | 30 days supply | Qty: 10 | Fill #0

## 2016-06-10 NOTE — Progress Notes (Signed)
Subjective:  Patient ID: Samantha [name], female    DOB: [name]  Age: 65 y.o. MRN: [id number]  CC: Diabetes   HPI Samantha HakeMaria [name] is 65 year old female with a history of hypertension, type 2 diabetes mellitus (A1c 8.2 from today), hyperlipidemia, depression who comes into the clinic for a follow-up visit.  Her blood pressure is severely elevated and she endorses compliance with antihypertensives as well as low-sodium diet. Regarding her diabetes she endorses some hypoglycemia of 60 and 70 at bedtime and so stopped taking her NovoLog 70/30 twice daily and has been taking it once daily which brought about resolution of the hypoglycemia. She denies numbness in her extremities and has no visual complaints.  Complains of malodorous urine but denies dysuria, hematuria or abdominal pain Denies chest pains or shortness of breath.  Outpatient Prescriptions Prior to Visit  Medication Sig Dispense Refill  . aspirin 81 MG EC tablet Take 1 tablet (81 mg total) by mouth daily. 30 tablet 3  . FLUoxetine (PROZAC) 20 MG tablet Take 1 tablet (20 mg total) by mouth daily. 90 tablet 3  . glimepiride (AMARYL) 4 MG tablet Take 1 tablet (4 mg total) by mouth daily with breakfast. 90 tablet 3  . metFORMIN (GLUCOPHAGE) 1000 MG tablet Take 1 tablet (1,000 mg total) by mouth 2 (two) times daily with a meal. 180 tablet 3  . metoprolol (LOPRESSOR) 100 MG tablet Take 1 tablet (100 mg total) by mouth 2 (two) times daily. 180 tablet 3  . simvastatin (ZOCOR) 40 MG tablet Take 1 tablet (40 mg total) by mouth at bedtime. 90 tablet 3  . losartan (COZAAR) 50 MG tablet Take 1 tablet (50 mg total) by mouth daily. 90 tablet 3  . gabapentin (NEURONTIN) 100 MG capsule Take 1 capsule (100 mg total) by mouth 3 (three) times daily. 270 capsule 3  . ibuprofen (ADVIL,MOTRIN) 200 MG tablet Take 400 mg by mouth every 6 (six) hours as needed (pain/fever). Reported on 06/10/2016    . Insulin Pen Needle (PEN NEEDLES 3/16") 31G  X 5 MM MISC 1 Stick by Does not apply route 2 (two) times daily at 8 am and 10 pm. 100 each 12  . docusate sodium 100 MG CAPS Take 100 mg by mouth daily. (Patient not taking: Reported on 06/10/2016) 10 capsule 0  . doxycycline (VIBRA-TABS) 100 MG tablet Take 1 tablet (100 mg total) by mouth 2 (two) times daily. (Patient not taking: Reported on 06/10/2016) 20 tablet 0  . insulin NPH-regular Human (NOVOLIN 70/30) (70-30) 100 UNIT/ML injection Inject 16 Units into the skin 2 (two) times daily with a meal. 30 mL 3   No facility-administered medications prior to visit.    ROS Review of Systems  Constitutional: Negative for activity change, appetite change and fatigue.  HENT: Negative for congestion, sinus pressure and sore throat.   Eyes: Negative for visual disturbance.  Respiratory: Negative for cough, chest tightness, shortness of breath and wheezing.   Cardiovascular: Negative for chest pain and palpitations.  Gastrointestinal: Negative for abdominal pain, constipation and abdominal distention.  Endocrine: Negative for polydipsia.  Genitourinary: Negative for dysuria and frequency.  Musculoskeletal: Negative for back pain and arthralgias.  Skin: Negative for rash.  Neurological: Negative for tremors, light-headedness and numbness.  Hematological: Does not bruise/bleed easily.  Psychiatric/Behavioral: Negative for behavioral problems and agitation.    Objective:  BP 181/84 mmHg  Pulse 63  Temp(Src) 98 F (36.7 C) (Oral)  Resp 14  Ht 5' 2.5" (1.588 m)  Hartford FinancialWt  149 lb 6.4 oz (67.767 kg)  BMI 26.87 kg/m2  SpO2 99%  BP/Weight 06/10/2016 02/26/2016 02/12/2016  Systolic BP 181 169 162  Diastolic BP 84 80 76  Wt. (Lbs) 149.4 148.2 158  BMI 26.87 27.1 28.89      Physical Exam  Constitutional: She is oriented to person, place, and time. She appears well-developed and well-nourished.  Cardiovascular: Normal rate, normal heart sounds and intact distal pulses.   No murmur  heard. Pulmonary/Chest: Effort normal and breath sounds normal. She has no wheezes. She has no rales. She exhibits no tenderness.  Abdominal: Soft. Bowel sounds are normal. She exhibits no distension and no mass. There is no tenderness.  Musculoskeletal: Normal range of motion.  Neurological: She is alert and oriented to person, place, and time.  Skin: Skin is warm and dry.  Psychiatric: She has a normal mood and affect.     Lab Results  Component Value Date   HGBA1C 8.2 06/10/2016    Lipid Panel     Component Value Date/Time   CHOL 120* 02/12/2016 1707   TRIG 137 02/12/2016 1707   HDL 37* 02/12/2016 1707   CHOLHDL 3.2 02/12/2016 1707   VLDL 27 02/12/2016 1707   LDLCALC 56 02/12/2016 1707     CMP Latest Ref Rng 02/12/2016 01/16/2016 05/17/2015  Glucose 65 - 99 mg/dL 161(W) 960(A) 540(J)  BUN 7 - 25 mg/dL 19 20 13   Creatinine 0.50 - 0.99 mg/dL 8.11(B) 1.47(W) 2.95(A)  Sodium 135 - 146 mmol/L 138 138 139  Potassium 3.5 - 5.3 mmol/L 4.4 3.9 4.0  Chloride 98 - 110 mmol/L 103 104 108  CO2 20 - 31 mmol/L 24 24 23   Calcium 8.6 - 10.4 mg/dL 9.3 9.9 8.9  Total Protein 6.1 - 8.1 g/dL 6.5 - -  Total Bilirubin 0.2 - 1.2 mg/dL 0.3 - -  Alkaline Phos 33 - 130 U/L 71 - -  AST 10 - 35 U/L 25 - -  ALT 6 - 29 U/L 22 - -     Assessment & Plan:   1. Type 2 diabetes mellitus with complication, with long-term current use of insulin (HCC) Uncontrolled with A1c of 8.2 This could be due to her taking NovoLog 70/30 once daily rather than twice daily. Regimen changed to 12 units twice daily We'll review blood sugar log to next visit and she has been educated on signs and symptoms of hypoglycemia and is to notify the clinic in that event  - HgB A1c - Glucose (CBG) - Urinalysis Dipstick - Microalbumin / creatinine urine ratio - insulin NPH-regular Human (NOVOLIN 70/30) (70-30) 100 UNIT/ML injection; Inject 12 Units into the skin 2 (two) times daily with a meal.  Dispense: 30 mL; Refill:  3  2. Accelerated hypertension Uncontrolled blood pressure despite compliance with medication Dose of valsartan/HCTZ administered in the clinic and patient observed for 30 minutes BP repeated prior to discharge Increased dose of losartan from 50 mg to 100 mg Review blood pressure at next visit. - valsartan-hydrochlorothiazide (DIOVAN-HCT) 160-12.5 MG per tablet 1 tablet; Take 1 tablet by mouth daily. - COMPLETE METABOLIC PANEL WITH GFR - losartan (COZAAR) 100 MG tablet; Take 1 tablet (100 mg total) by mouth daily.  Dispense: 30 tablet; Refill: 3  3. Hyperlipidemia Controlled  4. Malodorous urine UA negative for UTI Increase fluid intake  5. PAD (peripheral artery disease) (HCC) Asymptomatic  6. Depression Controlled  HCM at next office visit   Meds ordered this encounter  Medications  . valsartan-hydrochlorothiazide (  DIOVAN-HCT) 160-12.5 MG per tablet 1 tablet    Sig:   . losartan (COZAAR) 100 MG tablet    Sig: Take 1 tablet (100 mg total) by mouth daily.    Dispense:  30 tablet    Refill:  3    Discontinue previous dose  . insulin NPH-regular Human (NOVOLIN 70/30) (70-30) 100 UNIT/ML injection    Sig: Inject 12 Units into the skin 2 (two) times daily with a meal.    Dispense:  30 mL    Refill:  3    Discontinue previous dose    Follow-up: Return in about 2 weeks (around 06/24/2016) for follow up on hypertension.   Jaclyn Shaggy MD

## 2016-06-10 NOTE — Patient Instructions (Signed)
Diabetes Mellitus and Food It is important for you to manage your blood sugar (glucose) level. Your blood glucose level can be greatly affected by what you eat. Eating healthier foods in the appropriate amounts throughout the day at about the same time each day will help you control your blood glucose level. It can also help slow or prevent worsening of your diabetes mellitus. Healthy eating may even help you improve the level of your blood pressure and reach or maintain a healthy weight.  General recommendations for healthful eating and cooking habits include:  Eating meals and snacks regularly. Avoid going long periods of time without eating to lose weight.  Eating a diet that consists mainly of plant-based foods, such as fruits, vegetables, nuts, legumes, and whole grains.  Using low-heat cooking methods, such as baking, instead of high-heat cooking methods, such as deep frying. Work with your dietitian to make sure you understand how to use the Nutrition Facts information on food labels. HOW CAN FOOD AFFECT ME? Carbohydrates Carbohydrates affect your blood glucose level more than any other type of food. Your dietitian will help you determine how many carbohydrates to eat at each meal and teach you how to count carbohydrates. Counting carbohydrates is important to keep your blood glucose at a healthy level, especially if you are using insulin or taking certain medicines for diabetes mellitus. Alcohol Alcohol can cause sudden decreases in blood glucose (hypoglycemia), especially if you use insulin or take certain medicines for diabetes mellitus. Hypoglycemia can be a life-threatening condition. Symptoms of hypoglycemia (sleepiness, dizziness, and disorientation) are similar to symptoms of having too much alcohol.  If your health care provider has given you approval to drink alcohol, do so in moderation and use the following guidelines:  Women should not have more than one drink per day, and men  should not have more than two drinks per day. One drink is equal to:  12 oz of beer.  5 oz of wine.  1 oz of hard liquor.  Do not drink on an empty stomach.  Keep yourself hydrated. Have water, diet soda, or unsweetened iced tea.  Regular soda, juice, and other mixers might contain a lot of carbohydrates and should be counted. WHAT FOODS ARE NOT RECOMMENDED? As you make food choices, it is important to remember that all foods are not the same. Some foods have fewer nutrients per serving than other foods, even though they might have the same number of calories or carbohydrates. It is difficult to get your body what it needs when you eat foods with fewer nutrients. Examples of foods that you should avoid that are high in calories and carbohydrates but low in nutrients include:  Trans fats (most processed foods list trans fats on the Nutrition Facts label).  Regular soda.  Juice.  Candy.  Sweets, such as cake, pie, doughnuts, and cookies.  Fried foods. WHAT FOODS CAN I EAT? Eat nutrient-rich foods, which will nourish your body and keep you healthy. The food you should eat also will depend on several factors, including:  The calories you need.  The medicines you take.  Your weight.  Your blood glucose level.  Your blood pressure level.  Your cholesterol level. You should eat a variety of foods, including:  Protein.  Lean cuts of meat.  Proteins low in saturated fats, such as fish, egg whites, and beans. Avoid processed meats.  Fruits and vegetables.  Fruits and vegetables that may help control blood glucose levels, such as apples, mangoes, and   yams.  Dairy products.  Choose fat-free or low-fat dairy products, such as milk, yogurt, and cheese.  Grains, bread, pasta, and rice.  Choose whole grain products, such as multigrain bread, whole oats, and brown rice. These foods may help control blood pressure.  Fats.  Foods containing healthful fats, such as nuts,  avocado, olive oil, canola oil, and fish. DOES EVERYONE WITH DIABETES MELLITUS HAVE THE SAME MEAL PLAN? Because every person with diabetes mellitus is different, there is not one meal plan that works for everyone. It is very important that you meet with a dietitian who will help you create a meal plan that is just right for you.   This information is not intended to replace advice given to you by your health care provider. Make sure you discuss any questions you have with your health care provider.   Document Released: 09/10/2005 Document Revised: 01/04/2015 Document Reviewed: 11/10/2013 Elsevier Interactive Patient Education 2016 [employer] Inc[employer]  

## 2016-06-10 NOTE — Progress Notes (Signed)
Pt here for DM. Pt denies any pain. Pt has taken her medications today and does not need any refills. CBG is 226 and A1C is 8.2.

## 2016-06-11 LAB — COMPLETE METABOLIC PANEL WITH GFR
ALT: 18 U/L (ref 6–29)
AST: 19 U/L (ref 10–35)
Albumin: 4.1 g/dL (ref 3.6–5.1)
Alkaline Phosphatase: 90 U/L (ref 33–130)
BILIRUBIN TOTAL: 0.4 mg/dL (ref 0.2–1.2)
BUN: 17 mg/dL (ref 7–25)
CALCIUM: 9.5 mg/dL (ref 8.6–10.4)
CO2: 18 mmol/L — ABNORMAL LOW (ref 20–31)
CREATININE: 1.07 mg/dL — AB (ref 0.50–0.99)
Chloride: 102 mmol/L (ref 98–110)
GFR, EST AFRICAN AMERICAN: 64 mL/min (ref >=60)
GFR, Est Non African American: 56 mL/min — ABNORMAL LOW (ref >=60)
Glucose, Bld: 213 mg/dL — ABNORMAL HIGH (ref 65–99)
Potassium: 4.6 mmol/L (ref 3.5–5.3)
Sodium: 137 mmol/L (ref 135–146)
TOTAL PROTEIN: 7.1 g/dL (ref 6.1–8.1)

## 2016-06-13 LAB — MICROALBUMIN / CREATININE URINE RATIO
Creatinine, Urine: 172 mg/dL (ref 20–320)
Microalb Creat Ratio: 1492 mcg/mg creat — ABNORMAL HIGH (ref <30)
Microalb, Ur: 256.6 mg/dL — ABNORMAL HIGH

## 2016-06-17 ENCOUNTER — Telehealth: Payer: Self-pay

## 2016-06-17 NOTE — Telephone Encounter (Signed)
Through [place] interpretersBasilio [place]- [place] # [phone] attempted to contact patient with lab results.  VM was left on mobile phone asking for a return call.  Home number was invalid.

## 2016-06-17 NOTE — Telephone Encounter (Signed)
-----   Message from Jaclyn ShaggyEnobong Amao, MD sent at 06/16/2016  8:41 AM EDT ----- Labs are normal except for elevated glucose. Compliance with medications and diabetic diet is encouraged.

## 2016-07-07 MED FILL — metFORMIN HCL 1000 MG TABS: 1000 | 30 days supply | Qty: 60 | Fill #2

## 2016-07-07 MED FILL — ?LOSARTAN POTASSIUM 100 MG: 100 | 30 days supply | Qty: 30 | Fill #1

## 2016-07-07 MED FILL — FLUoxetine HCL 20 MG CAPS: 20 | 30 days supply | Qty: 30 | Fill #1

## 2016-07-07 MED FILL — ?GLIMEPIRIDE 4 MG TABLET: 4 | 30 days supply | Qty: 30 | Fill #2

## 2016-07-07 MED FILL — ?SIMVASTATIN 40 MG TABLET: 40 MG | 30 days supply | Qty: 30 | Fill #3

## 2016-08-03 ENCOUNTER — Ambulatory Visit: Payer: Self-pay | Attending: Podiatry | Admitting: Podiatry

## 2016-08-03 DIAGNOSIS — E118 Type 2 diabetes mellitus with unspecified complications: Secondary | ICD-10-CM

## 2016-08-03 DIAGNOSIS — Z794 Long term (current) use of insulin: Secondary | ICD-10-CM

## 2016-08-03 NOTE — Progress Notes (Signed)
Subjective:     Patient ID: Samantha [name], female   DOB: August 16, 1951, 65 y.o.   MRN: [id number]  HPI 65 year old female presents to the office today for follow-up evaluation and diabetic risk assessment. No new concerns other than increased numbness to her feet at times. She is on gabapentin.    Objective: General: AAO x3, NAD  Dermatological: No open sores and no pre-ulcerative lesions.   Vascular: Dorsalis Pedis artery and Posterior Tibial artery pedal pulses are 1/4 bilateral with immedate capillary fill time. Pedal hair growth present. No varicosities and no lower extremity edema present bilateral. There is no pain with calf compression, swelling, warmth, erythema.   Neruologic: Sensation decreased with SWMF.  Musculoskeletal: Hammertoes present. No gross boney pedal deformities bilateral. No pain, crepitus, or limitation noted with foot and ankle range of motion bilateral. Muscular strength 5/5 in all groups tested bilateral.  Gait: Unassisted, Nonantalgic.   Assessment: Presents for diabetic risk assessment; history of ulceration   Plan: -Treatment options discussed including all alternatives, risks, and complications -At this time, there is no evidence of open ulcer. Continue to monitor daily. -Discussed daily foot inspection -Continue gabapentin -Glucose control  Ovid CurdMatthew Zaiah Eckerson, DPM

## 2016-08-10 MED FILL — GABAPENTIN 100 MG CAPSULE: 100 | 90 days supply | Qty: 270 | Fill #2

## 2016-08-10 MED FILL — SIMVASTATIN 40 MG TABLET: 40 | 30 days supply | Qty: 30 | Fill #4

## 2016-08-10 MED FILL — METOPROLOL TARTRATE 100 MG: 100 | 90 days supply | Qty: 180 | Fill #1

## 2016-08-10 MED FILL — NOVOLIN 70/30 100 UNITS/ML: (70-30) 100 | 41 days supply | Qty: 10 | Fill #1

## 2016-08-10 MED FILL — ?LOSARTAN POTASSIUM 100 MG: 100 | 30 days supply | Qty: 30 | Fill #2

## 2016-08-10 MED FILL — FLUoxetine HCL 20 MG CAPS: 20 | 30 days supply | Qty: 30 | Fill #2

## 2016-08-10 MED FILL — metFORMIN HCL 1000 MG TABS: 1000 | 30 days supply | Qty: 60 | Fill #3

## 2016-08-10 MED FILL — ?GLIMEPIRIDE 4 MG TABLET: 4 | 30 days supply | Qty: 30 | Fill #3

## 2016-08-19 ENCOUNTER — Ambulatory Visit: Payer: Self-pay | Attending: Internal Medicine

## 2016-09-09 MED FILL — metFORMIN HCL 1000 MG TABS: 1000 | 30 days supply | Qty: 60 | Fill #4

## 2016-09-09 MED FILL — FLUoxetine HCL 20 MG CAPS: 20 | 30 days supply | Qty: 30 | Fill #3

## 2016-09-09 MED FILL — ?SIMVASTATIN 40 MG TABLET: 40 MG | 30 days supply | Qty: 30 | Fill #5

## 2016-09-09 MED FILL — ?GLIMEPIRIDE 4 MG TABLET: 4 | 30 days supply | Qty: 30 | Fill #4

## 2016-09-09 MED FILL — ?LOSARTAN POTASSIUM 100 MG: 100 | 30 days supply | Qty: 30 | Fill #3

## 2016-10-13 ENCOUNTER — Other Ambulatory Visit: Payer: Self-pay | Admitting: Family Medicine

## 2016-10-13 DIAGNOSIS — I10 Essential (primary) hypertension: Secondary | ICD-10-CM

## 2016-10-13 MED FILL — metFORMIN HCL 1000 MG TABS: 1000 | 30 days supply | Qty: 60 | Fill #5

## 2016-10-13 MED FILL — GLIMEPIRIDE 4 MG TABLET: 4 | 30 days supply | Qty: 30 | Fill #5

## 2016-10-13 MED FILL — LOSARTAN POTASSIUM 100 MG T: 100 | 30 days supply | Qty: 30 | Fill #0

## 2016-10-13 MED FILL — ?SIMVASTATIN 40 MG TABLET: 40 MG | 30 days supply | Qty: 30 | Fill #6

## 2016-10-13 MED FILL — FLUoxetine HCL 20 MG CAPS: 20 | 30 days supply | Qty: 30 | Fill #4

## 2016-11-24 MED FILL — METOPROLOL TARTRATE 100 MG: 100 | 90 days supply | Qty: 180 | Fill #2

## 2016-11-24 MED FILL — FLUoxetine HCL 20 MG CAPS: 20 | 30 days supply | Qty: 30 | Fill #5

## 2016-11-24 MED FILL — GLIMEPIRIDE 4 MG TABLET: 4 | 30 days supply | Qty: 30 | Fill #6

## 2016-11-24 MED FILL — metFORMIN HCL 1000 MG TABS: 1000 | 30 days supply | Qty: 60 | Fill #6

## 2016-11-24 MED FILL — GABAPENTIN 100 MG CAPSULE: 100 | 90 days supply | Qty: 270 | Fill #3

## 2016-11-24 MED FILL — ?SIMVASTATIN 40 MG TABLET: 40 MG | 30 days supply | Qty: 30 | Fill #7

## 2016-12-03 ENCOUNTER — Other Ambulatory Visit: Payer: Self-pay | Admitting: Internal Medicine

## 2016-12-03 DIAGNOSIS — I10 Essential (primary) hypertension: Secondary | ICD-10-CM

## 2016-12-04 ENCOUNTER — Ambulatory Visit: Payer: Self-pay | Admitting: Family Medicine

## 2016-12-23 ENCOUNTER — Encounter: Payer: Self-pay | Admitting: Family Medicine

## 2016-12-23 ENCOUNTER — Ambulatory Visit: Payer: Self-pay | Attending: Family Medicine | Admitting: Family Medicine

## 2016-12-23 ENCOUNTER — Other Ambulatory Visit: Payer: Self-pay | Admitting: Internal Medicine

## 2016-12-23 VITALS — BP 187/81 | HR 60 | Temp 97.7°F | Ht 62.0 in | Wt 148.8 lb

## 2016-12-23 DIAGNOSIS — E118 Type 2 diabetes mellitus with unspecified complications: Secondary | ICD-10-CM

## 2016-12-23 DIAGNOSIS — Z7982 Long term (current) use of aspirin: Secondary | ICD-10-CM | POA: Insufficient documentation

## 2016-12-23 DIAGNOSIS — I10 Essential (primary) hypertension: Secondary | ICD-10-CM | POA: Insufficient documentation

## 2016-12-23 DIAGNOSIS — Z794 Long term (current) use of insulin: Secondary | ICD-10-CM | POA: Insufficient documentation

## 2016-12-23 DIAGNOSIS — F331 Major depressive disorder, recurrent, moderate: Secondary | ICD-10-CM | POA: Insufficient documentation

## 2016-12-23 DIAGNOSIS — L03032 Cellulitis of left toe: Secondary | ICD-10-CM | POA: Insufficient documentation

## 2016-12-23 DIAGNOSIS — Z79899 Other long term (current) drug therapy: Secondary | ICD-10-CM | POA: Insufficient documentation

## 2016-12-23 DIAGNOSIS — Z9114 Patient's other noncompliance with medication regimen: Secondary | ICD-10-CM | POA: Insufficient documentation

## 2016-12-23 DIAGNOSIS — I739 Peripheral vascular disease, unspecified: Secondary | ICD-10-CM

## 2016-12-23 DIAGNOSIS — E785 Hyperlipidemia, unspecified: Secondary | ICD-10-CM | POA: Insufficient documentation

## 2016-12-23 DIAGNOSIS — E1151 Type 2 diabetes mellitus with diabetic peripheral angiopathy without gangrene: Secondary | ICD-10-CM | POA: Insufficient documentation

## 2016-12-23 LAB — COMPLETE METABOLIC PANEL WITH GFR
ALBUMIN: 3.8 g/dL (ref 3.6–5.1)
ALK PHOS: 88 U/L (ref 33–130)
ALT: 23 U/L (ref 6–29)
AST: 24 U/L (ref 10–35)
BUN: 15 mg/dL (ref 7–25)
CALCIUM: 9.2 mg/dL (ref 8.6–10.4)
CO2: 21 mmol/L (ref 20–31)
Chloride: 103 mmol/L (ref 98–110)
Creat: 1.02 mg/dL — ABNORMAL HIGH (ref 0.50–0.99)
GFR, EST NON AFRICAN AMERICAN: 59 mL/min — AB (ref >=60)
GFR, Est African American: 68 mL/min (ref >=60)
Glucose, Bld: 318 mg/dL — ABNORMAL HIGH (ref 65–99)
POTASSIUM: 4.6 mmol/L (ref 3.5–5.3)
Sodium: 137 mmol/L (ref 135–146)
Total Bilirubin: 0.4 mg/dL (ref 0.2–1.2)
Total Protein: 6.4 g/dL (ref 6.1–8.1)

## 2016-12-23 LAB — GLUCOSE, POCT (MANUAL RESULT ENTRY)
POC GLUCOSE: 338 mg/dL — AB (ref 70–99)
POC GLUCOSE: 302 mg/dL — AB (ref 70–99)

## 2016-12-23 LAB — POCT GLYCOSYLATED HEMOGLOBIN (HGB A1C): HEMOGLOBIN A1C: 9.1

## 2016-12-23 MED ORDER — GABAPENTIN 100 MG PO CAPS: 100.0000 mg | ORAL_CAPSULE | Freq: Three times a day (TID) | ORAL | 3 refills | Status: DC

## 2016-12-23 MED ORDER — FLUOXETINE HCL 20 MG PO TABS: 20.0000 mg | ORAL_TABLET | Freq: Every day | ORAL | 3 refills | Status: DC

## 2016-12-23 MED ORDER — SIMVASTATIN 40 MG PO TABS: 40.0000 mg | ORAL_TABLET | Freq: Every day | ORAL | 3 refills | Status: DC

## 2016-12-23 MED ORDER — CEPHALEXIN 500 MG PO CAPS: 500.0000 mg | ORAL_CAPSULE | Freq: Two times a day (BID) | ORAL | 0 refills | Status: DC

## 2016-12-23 MED ORDER — TRUEPLUS LANCETS 28G MISC: 1.0000 | Freq: Three times a day (TID) | 12 refills | Status: DC

## 2016-12-23 MED ORDER — GLUCOSE BLOOD VI STRP: ORAL_STRIP | 12 refills | Status: DC

## 2016-12-23 MED ORDER — INSULIN NPH ISOPHANE & REGULAR (70-30) 100 UNIT/ML ~~LOC~~ SUSP: 12.0000 [IU] | Freq: Two times a day (BID) | SUBCUTANEOUS | 3 refills | Status: DC

## 2016-12-23 MED ORDER — METFORMIN HCL 1000 MG PO TABS: 1000.0000 mg | ORAL_TABLET | Freq: Two times a day (BID) | ORAL | 3 refills | Status: DC

## 2016-12-23 MED ORDER — TRUE METRIX METER DEVI: 1.0000 | Freq: Three times a day (TID) | 0 refills | Status: DC

## 2016-12-23 MED ORDER — ASPIRIN 81 MG PO TBEC: 81.0000 mg | DELAYED_RELEASE_TABLET | Freq: Every day | ORAL | 3 refills | Status: DC

## 2016-12-23 MED ORDER — GLIMEPIRIDE 4 MG PO TABS: 4.0000 mg | ORAL_TABLET | Freq: Every day | ORAL | 3 refills | Status: DC

## 2016-12-23 MED ORDER — INSULIN ASPART 100 UNIT/ML ~~LOC~~ SOLN
6.0000 [IU] | Freq: Once | SUBCUTANEOUS | Status: AC
  Administered 2016-12-23: 6 [IU] via SUBCUTANEOUS

## 2016-12-23 MED ORDER — METOPROLOL TARTRATE 100 MG PO TABS
100.0000 mg | ORAL_TABLET | Freq: Two times a day (BID) | ORAL | 3 refills | Status: DC
Start: 2016-12-23 — End: 2017-02-23

## 2016-12-23 MED ORDER — LOSARTAN POTASSIUM 100 MG PO TABS: ORAL_TABLET | ORAL | 1 refills | Status: DC

## 2016-12-23 MED FILL — TRUE METRIX TEST STRIP: 30 days supply | Qty: 100 | Fill #0

## 2016-12-23 MED FILL — GLIMEPIRIDE 4 MG TABLET: 4 | 30 days supply | Qty: 30 | Fill #7

## 2016-12-23 MED FILL — CEPHALEXIN 500 MG CAPSULE: 500 | 10 days supply | Qty: 20 | Fill #0

## 2016-12-23 MED FILL — TRUE METRIX BLOOD GLUCOSE M: W/DEVICE | 1 days supply | Qty: 1 | Fill #0

## 2016-12-23 MED FILL — ?SIMVASTATIN 40 MG TABLET: 40 MG | 30 days supply | Qty: 30 | Fill #8

## 2016-12-23 MED FILL — FLUoxetine HCL 20 MG CAPS: 20 | 30 days supply | Qty: 30 | Fill #6

## 2016-12-23 MED FILL — metFORMIN HCL 1000 MG TABS: 1000 | 30 days supply | Qty: 60 | Fill #7

## 2016-12-23 MED FILL — TRUEplus LANCETS 28G MISC: 30 days supply | Qty: 100 | Fill #0

## 2016-12-23 MED FILL — LOSARTAN POTASSIUM 100 MG T: 100 | 30 days supply | Qty: 30 | Fill #0

## 2016-12-23 MED FILL — !NOVOLIN 70/30 100 UNITS/ML: (70-30) 100 | 40 days supply | Qty: 10 | Fill #0

## 2016-12-23 NOTE — Patient Instructions (Signed)
Diabetes Mellitus and Food It is important for you to manage your blood sugar (glucose) level. Your blood glucose level can be greatly affected by what you eat. Eating healthier foods in the appropriate amounts throughout the day at about the same time each day will help you control your blood glucose level. It can also help slow or prevent worsening of your diabetes mellitus. Healthy eating may even help you improve the level of your blood pressure and reach or maintain a healthy weight. General recommendations for healthful eating and cooking habits include:  Eating meals and snacks regularly. Avoid going long periods of time without eating to lose weight.  Eating a diet that consists mainly of plant-based foods, such as fruits, vegetables, nuts, legumes, and whole grains.  Using low-heat cooking methods, such as baking, instead of high-heat cooking methods, such as deep frying.  Work with your dietitian to make sure you understand how to use the Nutrition Facts information on food labels. How can food affect me? Carbohydrates Carbohydrates affect your blood glucose level more than any other type of food. Your dietitian will help you determine how many carbohydrates to eat at each meal and teach you how to count carbohydrates. Counting carbohydrates is important to keep your blood glucose at a healthy level, especially if you are using insulin or taking certain medicines for diabetes mellitus. Alcohol Alcohol can cause sudden decreases in blood glucose (hypoglycemia), especially if you use insulin or take certain medicines for diabetes mellitus. Hypoglycemia can be a life-threatening condition. Symptoms of hypoglycemia (sleepiness, dizziness, and disorientation) are similar to symptoms of having too much alcohol. If your health care provider has given you approval to drink alcohol, do so in moderation and use the following guidelines:  Women should not have more than one drink per day, and men  should not have more than two drinks per day. One drink is equal to: ? 12 oz of beer. ? 5 oz of wine. ? 1 oz of hard liquor.  Do not drink on an empty stomach.  Keep yourself hydrated. Have water, diet soda, or unsweetened iced tea.  Regular soda, juice, and other mixers might contain a lot of carbohydrates and should be counted.  What foods are not recommended? As you make food choices, it is important to remember that all foods are not the same. Some foods have fewer nutrients per serving than other foods, even though they might have the same number of calories or carbohydrates. It is difficult to get your body what it needs when you eat foods with fewer nutrients. Examples of foods that you should avoid that are high in calories and carbohydrates but low in nutrients include:  Trans fats (most processed foods list trans fats on the Nutrition Facts label).  Regular soda.  Juice.  Candy.  Sweets, such as cake, pie, doughnuts, and cookies.  Fried foods.  What foods can I eat? Eat nutrient-rich foods, which will nourish your body and keep you healthy. The food you should eat also will depend on several factors, including:  The calories you need.  The medicines you take.  Your weight.  Your blood glucose level.  Your blood pressure level.  Your cholesterol level.  You should eat a variety of foods, including:  Protein. ? Lean cuts of meat. ? Proteins low in saturated fats, such as fish, egg whites, and beans. Avoid processed meats.  Fruits and vegetables. ? Fruits and vegetables that may help control blood glucose levels, such as apples,   mangoes, and yams.  Dairy products. ? Choose fat-free or low-fat dairy products, such as milk, yogurt, and cheese.  Grains, bread, pasta, and rice. ? Choose whole grain products, such as multigrain bread, whole oats, and brown rice. These foods may help control blood pressure.  Fats. ? Foods containing healthful fats, such as  nuts, avocado, olive oil, canola oil, and fish.  Does everyone with diabetes mellitus have the same meal plan? Because every person with diabetes mellitus is different, there is not one meal plan that works for everyone. It is very important that you meet with a dietitian who will help you create a meal plan that is just right for you. This information is not intended to replace advice given to you by your health care provider. Make sure you discuss any questions you have with your health care provider. Document Released: 09/10/2005 Document Revised: 05/21/2016 Document Reviewed: 11/10/2013 Elsevier Interactive Patient Education  2017 Elsevier Inc.  

## 2016-12-23 NOTE — Progress Notes (Signed)
Out of losartan Doesn't have the equipment to test BS at home

## 2016-12-23 NOTE — Progress Notes (Signed)
Subjective:  Patient ID: Samantha [name], female    DOB: [birth year]  Age: 65 y.o. MRN: [id number]  CC: Diabetes and Toe Pain (left toe-painful and turns "purple")   HPI Samantha [name] is a 65 year old female with a history of hypertension, type 2 diabetes mellitus (A1c 9.1 from today), hyperlipidemia, depression who comes into the clinic for a follow-up visit.  She has not been taking her insulin because "it makes her feel bad". She is accompanied by her daughter who informs me that the patient has been noncompliant with diabetic diet or exercise and drinks a lot of sodas.  Her blood pressure is elevated today because she ran out of her antihypertensives.  Depression is currently stable on SSRI.  She complains of left big toe pain and swelling for the last 1 month and denies any discharge; denies history of trauma to the left big toe.  Past Medical History:  Diagnosis Date  . Acute pyelonephritis   . Bacteremia, escherichia coli 01/12/2015  . Chest pain   . Critical lower limb ischemia   . Hyperlipidemia   . Hypertension   . Left carotid bruit   . PAD (peripheral artery disease) (HCC)   . Type II diabetes mellitus (HCC)     Past Surgical History:  Procedure Laterality Date  . ABDOMINAL ANGIOGRAM  05/16/2015   Procedure: Abdominal Angiogram;  Surgeon: Runell Gess, MD;  Location: [facility] INVASIVE CV LAB;  Service: Cardiovascular;;  . ABDOMINAL HYSTERECTOMY  ~ 2000  . PERIPHERAL VASCULAR CATHETERIZATION Bilateral 05/16/2015   Procedure: Lower Extremity Angiography;  Surgeon: Runell Gess, MD; renal arteries widely patent, L-SFA 75%, L-pop 95%, L-peroneal 90%; R-SFA 40%, R-pop 50%, R-prox peroneal 90%; directional atherectomy L-SFA, p-pop, drug-eluting balloon angioplasty reducing the stenoses to 0, small linear dissection was not flow limiting      . PERIPHERAL VASCULAR CATHETERIZATION Left 05/16/2015   Procedure: Peripheral Vascular Atherectomy;  Surgeon: Runell Gess, MD;  Location: [facility] INVASIVE CV LAB;  Service: Cardiovascular;  Laterality: Left;  sfa    No Known Allergies  Outpatient Medications Prior to Visit  Medication Sig Dispense Refill  . ibuprofen (ADVIL,MOTRIN) 200 MG tablet Take 400 mg by mouth every 6 (six) hours as needed (pain/fever). Reported on 06/10/2016    . Insulin Pen Needle (PEN NEEDLES 3/16") 31G X 5 MM MISC 1 Stick by Does not apply route 2 (two) times daily at 8 am and 10 pm. 100 each 12  . aspirin 81 MG EC tablet Take 1 tablet (81 mg total) by mouth daily. 30 tablet 3  . FLUoxetine (PROZAC) 20 MG tablet Take 1 tablet (20 mg total) by mouth daily. 90 tablet 3  . gabapentin (NEURONTIN) 100 MG capsule Take 1 capsule (100 mg total) by mouth 3 (three) times daily. 270 capsule 3  . glimepiride (AMARYL) 4 MG tablet Take 1 tablet (4 mg total) by mouth daily with breakfast. 90 tablet 3  . insulin NPH-regular Human (NOVOLIN 70/30) (70-30) 100 UNIT/ML injection Inject 12 Units into the skin 2 (two) times daily with a meal. 30 mL 3  . losartan (COZAAR) 100 MG tablet TAKE 1 TABLET BY MOUTH DAILY. **MUST HAVE OFFICE VISIT FOR REFILLS** 30 tablet 0  . metFORMIN (GLUCOPHAGE) 1000 MG tablet Take 1 tablet (1,000 mg total) by mouth 2 (two) times daily with a meal. 180 tablet 3  . metoprolol (LOPRESSOR) 100 MG tablet Take 1 tablet (100 mg total) by mouth 2 (two) times daily. 180 tablet 3  .  simvastatin (ZOCOR) 40 MG tablet Take 1 tablet (40 mg total) by mouth at bedtime. 90 tablet 3   Facility-Administered Medications Prior to Visit  Medication Dose Route Frequency Provider Last Rate Last Dose  . valsartan-hydrochlorothiazide (DIOVAN-HCT) 160-12.5 MG per tablet 1 tablet  1 tablet Oral Daily Jaclyn ShaggyEnobong Amao, MD   1 tablet at 06/10/16 1110    ROS Review of Systems  Constitutional: Negative for activity change, appetite change and fatigue.  HENT: Negative for congestion, sinus pressure and sore throat.   Eyes: Negative for visual disturbance.    Respiratory: Negative for cough, chest tightness, shortness of breath and wheezing.   Cardiovascular: Negative for chest pain and palpitations.  Gastrointestinal: Negative for abdominal distention, abdominal pain and constipation.  Endocrine: Negative for polydipsia.  Genitourinary: Negative for dysuria and frequency.  Musculoskeletal: Negative for arthralgias and back pain.  Skin:       See hpi  Neurological: Negative for tremors, light-headedness and numbness.  Hematological: Does not bruise/bleed easily.  Psychiatric/Behavioral: Negative for agitation and behavioral problems.    Objective:  BP (!) 187/81 (BP Location: Right Arm, Patient Position: Sitting, Cuff Size: Small)   Pulse 60   Temp 97.7 F (36.5 C) (Oral)   Ht 5\' 2"  (1.575 m)   Wt 148 lb 12.8 oz (67.5 kg)   SpO2 100%   BMI 27.22 kg/m   BP/Weight 12/23/2016 06/10/2016 02/26/2016  Systolic BP 187 181 169  Diastolic BP 81 84 80  Wt. (Lbs) 148.8 149.4 148.2  BMI 27.22 26.87 27.1     Physical Exam Constitutional: She is oriented to person, place, and time. She appears well-developed and well-nourished.  Cardiovascular: Normal rate, normal heart sounds and intact distal pulses.   No murmur heard. Pulmonary/Chest: Effort normal and breath sounds normal. She has no wheezes. She has no rales. She exhibits no tenderness.  Abdominal: Soft. Bowel sounds are normal. She exhibits no distension and no mass. There is no tenderness.  Musculoskeletal: Normal range of motion.  left big toe with edema and mild erythema  Neurological: She is alert and oriented to person, place, and time.  Skin: Skin is warm and dry.  Psychiatric: She has a normal mood and affect.   Lab Results  Component Value Date   HGBA1C 9.1 12/23/2016    CMP Latest Ref Rng & Units 06/10/2016 02/12/2016 01/16/2016  Glucose 65 - 99 mg/dL 161(W213(H) 960(A129(H) 540(J231(H)  BUN 7 - 25 mg/dL 17 19 20   Creatinine 0.50 - 0.99 mg/dL 8.11(B1.07(H) 1.47(W1.09(H) 2.95(A1.03(H)  Sodium 135 - 146  mmol/L 137 138 138  Potassium 3.5 - 5.3 mmol/L 4.6 4.4 3.9  Chloride 98 - 110 mmol/L 102 103 104  CO2 20 - 31 mmol/L 18(L) 24 24  Calcium 8.6 - 10.4 mg/dL 9.5 9.3 9.9  Total Protein 6.1 - 8.1 g/dL 7.1 6.5 -  Total Bilirubin 0.2 - 1.2 mg/dL 0.4 0.3 -  Alkaline Phos 33 - 130 U/L 90 71 -  AST 10 - 35 U/L 19 25 -  ALT 6 - 29 U/L 18 22 -    Lipid Panel     Component Value Date/Time   CHOL 120 (L) 02/12/2016 1707   TRIG 137 02/12/2016 1707   HDL 37 (L) 02/12/2016 1707   CHOLHDL 3.2 02/12/2016 1707   VLDL 27 02/12/2016 1707   LDLCALC 56 02/12/2016 1707    Assessment & Plan:   1. Non compliance w medication regimen To be have discussed noncompliance with regimen and and resolved however  the patient is nonchalant  2. PAD (peripheral artery disease) (HCC) - simvastatin (ZOCOR) 40 MG tablet; Take 1 tablet (40 mg total) by mouth at bedtime.  Dispense: 90 tablet; Refill: 3  3. Essential hypertension Uncontrolled due to running out of medications which I have refilled Low-sodium diet - metoprolol (LOPRESSOR) 100 MG tablet; Take 1 tablet (100 mg total) by mouth 2 (two) times daily.  Dispense: 180 tablet; Refill: 3 - losartan (COZAAR) 100 MG tablet; TAKE 1 TABLET BY MOUTH DAILY.  Dispense: 90 tablet; Refill: 1 - COMPLETE METABOLIC PANEL WITH GFR  4. Accelerated hypertension   5. Type 2 diabetes mellitus with complication, with long-term current use of insulin (HCC) Uncontrolled with A1c of 9.1 NovoLog 6 units administered for Sugar of 338 Strongly encouraged her to take her insulin and to reduce by 2 units in the event of glycemia - HgB A1c - Glucose (CBG) - metFORMIN (GLUCOPHAGE) 1000 MG tablet; Take 1 tablet (1,000 mg total) by mouth 2 (two) times daily with a meal.  Dispense: 180 tablet; Refill: 3 - glimepiride (AMARYL) 4 MG tablet; Take 1 tablet (4 mg total) by mouth daily with breakfast.  Dispense: 90 tablet; Refill: 3 - gabapentin (NEURONTIN) 100 MG capsule; Take 1 capsule (100  mg total) by mouth 3 (three) times daily.  Dispense: 270 capsule; Refill: 3 - insulin NPH-regular Human (NOVOLIN 70/30) (70-30) 100 UNIT/ML injection; Inject 12 Units into the skin 2 (two) times daily with a meal.  Dispense: 30 mL; Refill: 3 - glucose blood (TRUE METRIX BLOOD GLUCOSE TEST) test strip; Use 3 times daily before meals  Dispense: 100 each; Refill: 12 - Blood Glucose Monitoring Suppl (TRUE METRIX METER) DEVI; 1 each by Does not apply route 3 (three) times daily before meals.  Dispense: 1 Device; Refill: 0 - TRUEPLUS LANCETS 28G MISC; 1 each by Does not apply route 3 (three) times daily before meals.  Dispense: 100 each; Refill: 12  6. Moderate episode of recurrent major depressive disorder (HCC) - FLUoxetine (PROZAC) 20 MG tablet; Take 1 tablet (20 mg total) by mouth daily.  Dispense: 90 tablet; Refill: 3  7. Cellulitis of toe of left foot - cephALEXin (KEFLEX) 500 MG capsule; Take 1 capsule (500 mg total) by mouth 2 (two) times daily.  Dispense: 20 capsule; Refill: 0   Meds ordered this encounter  Medications  . simvastatin (ZOCOR) 40 MG tablet    Sig: Take 1 tablet (40 mg total) by mouth at bedtime.    Dispense:  90 tablet    Refill:  3  . metoprolol (LOPRESSOR) 100 MG tablet    Sig: Take 1 tablet (100 mg total) by mouth 2 (two) times daily.    Dispense:  180 tablet    Refill:  3  . losartan (COZAAR) 100 MG tablet    Sig: TAKE 1 TABLET BY MOUTH DAILY.    Dispense:  90 tablet    Refill:  1  . metFORMIN (GLUCOPHAGE) 1000 MG tablet    Sig: Take 1 tablet (1,000 mg total) by mouth 2 (two) times daily with a meal.    Dispense:  180 tablet    Refill:  3  . glimepiride (AMARYL) 4 MG tablet    Sig: Take 1 tablet (4 mg total) by mouth daily with breakfast.    Dispense:  90 tablet    Refill:  3  . gabapentin (NEURONTIN) 100 MG capsule    Sig: Take 1 capsule (100 mg total) by mouth 3 (three) times  daily.    Dispense:  270 capsule    Refill:  3  . aspirin 81 MG EC tablet     Sig: Take 1 tablet (81 mg total) by mouth daily.    Dispense:  30 tablet    Refill:  3  . FLUoxetine (PROZAC) 20 MG tablet    Sig: Take 1 tablet (20 mg total) by mouth daily.    Dispense:  90 tablet    Refill:  3  . cephALEXin (KEFLEX) 500 MG capsule    Sig: Take 1 capsule (500 mg total) by mouth 2 (two) times daily.    Dispense:  20 capsule    Refill:  0  . insulin NPH-regular Human (NOVOLIN 70/30) (70-30) 100 UNIT/ML injection    Sig: Inject 12 Units into the skin 2 (two) times daily with a meal.    Dispense:  30 mL    Refill:  3  . glucose blood (TRUE METRIX BLOOD GLUCOSE TEST) test strip    Sig: Use 3 times daily before meals    Dispense:  100 each    Refill:  12  . Blood Glucose Monitoring Suppl (TRUE METRIX METER) DEVI    Sig: 1 each by Does not apply route 3 (three) times daily before meals.    Dispense:  1 Device    Refill:  0  . TRUEPLUS LANCETS 28G MISC    Sig: 1 each by Does not apply route 3 (three) times daily before meals.    Dispense:  100 each    Refill:  12    Follow-up: Return in about 3 months (around 03/23/2017) for Follow-up on diabetes and hypertension.   Jaclyn ShaggyEnobong Amao MD

## 2016-12-24 ENCOUNTER — Telehealth: Payer: Self-pay

## 2016-12-24 NOTE — Telephone Encounter (Signed)
-----   Message from Jaclyn ShaggyEnobong Amao, MD sent at 12/24/2016  1:35 PM EST ----- Elevated glucose; please ensure compliance with insulin and oral diabetic medications.

## 2016-12-24 NOTE — Telephone Encounter (Signed)
Through [employer] writer called patient on her mobile and home number and was unable to LVM.  Writer sent her a letter in the mail.

## 2016-12-30 ENCOUNTER — Ambulatory Visit: Payer: Self-pay | Attending: Family Medicine

## 2017-01-19 ENCOUNTER — Other Ambulatory Visit: Payer: Self-pay | Admitting: Family Medicine

## 2017-01-19 DIAGNOSIS — L03032 Cellulitis of left toe: Secondary | ICD-10-CM

## 2017-01-19 MED FILL — LOSARTAN POTASSIUM 100 MG T: 100 | 30 days supply | Qty: 30 | Fill #1

## 2017-01-19 MED FILL — !NOVOLIN 70/30 100 UNITS/ML: (70-30) 100 | 40 days supply | Qty: 10 | Fill #1

## 2017-01-19 MED FILL — GLIMEPIRIDE 4 MG TABLET: 4 | 30 days supply | Qty: 30 | Fill #8

## 2017-01-19 MED FILL — FLUoxetine HCL 20 MG CAPS: 20 | 30 days supply | Qty: 30 | Fill #7

## 2017-01-19 MED FILL — metFORMIN HCL 1000 MG TABS: 1000 | 30 days supply | Qty: 60 | Fill #8

## 2017-01-19 MED FILL — ?SIMVASTATIN 40 MG TABLET: 40 MG | 30 days supply | Qty: 30 | Fill #9

## 2017-01-25 ENCOUNTER — Ambulatory Visit: Payer: Self-pay | Attending: Family Medicine

## 2017-01-28 ENCOUNTER — Other Ambulatory Visit: Payer: Self-pay | Admitting: Family Medicine

## 2017-01-28 MED ORDER — "INSULIN SYRINGE-NEEDLE U-100 31G X 15/64"" 0.5 ML MISC": 12 refills | Status: DC

## 2017-02-22 ENCOUNTER — Other Ambulatory Visit: Payer: Self-pay | Admitting: Family Medicine

## 2017-02-22 DIAGNOSIS — L03032 Cellulitis of left toe: Secondary | ICD-10-CM

## 2017-02-22 MED FILL — SIMVASTATIN 40 MG TABLET: 40 | 30 days supply | Qty: 30 | Fill #0

## 2017-02-22 MED FILL — GLIMEPIRIDE 4 MG TABLET: 4 | 30 days supply | Qty: 30 | Fill #0

## 2017-02-22 MED FILL — METOPROLOL TARTRATE 100 MG: 100 | 30 days supply | Qty: 60 | Fill #0

## 2017-02-22 MED FILL — GABAPENTIN 100 MG CAPSULE: 100 | 30 days supply | Qty: 90 | Fill #0

## 2017-02-22 MED FILL — LOSARTAN POTASSIUM 100 MG T: 100 | 30 days supply | Qty: 30 | Fill #2

## 2017-02-22 MED FILL — metFORMIN HCL 1000 MG TABS: 1000 | 30 days supply | Qty: 60 | Fill #0

## 2017-02-22 MED FILL — FLUoxetine HCL 20 MG CAPS: 20 | 30 days supply | Qty: 30 | Fill #0

## 2017-02-23 ENCOUNTER — Telehealth: Payer: Self-pay | Admitting: Family Medicine

## 2017-02-23 DIAGNOSIS — F331 Major depressive disorder, recurrent, moderate: Secondary | ICD-10-CM

## 2017-02-23 DIAGNOSIS — Z794 Long term (current) use of insulin: Secondary | ICD-10-CM

## 2017-02-23 DIAGNOSIS — I10 Essential (primary) hypertension: Secondary | ICD-10-CM

## 2017-02-23 DIAGNOSIS — E118 Type 2 diabetes mellitus with unspecified complications: Secondary | ICD-10-CM

## 2017-02-23 DIAGNOSIS — I739 Peripheral vascular disease, unspecified: Secondary | ICD-10-CM

## 2017-02-23 MED ORDER — SIMVASTATIN 40 MG PO TABS: 40.0000 mg | ORAL_TABLET | Freq: Every day | ORAL | 0 refills | Status: DC

## 2017-02-23 MED ORDER — FLUOXETINE HCL 20 MG PO TABS: 20.0000 mg | ORAL_TABLET | Freq: Every day | ORAL | 0 refills | Status: DC

## 2017-02-23 MED ORDER — GABAPENTIN 100 MG PO CAPS: 100.0000 mg | ORAL_CAPSULE | Freq: Three times a day (TID) | ORAL | 0 refills | Status: DC

## 2017-02-23 MED ORDER — INSULIN NPH ISOPHANE & REGULAR (70-30) 100 UNIT/ML ~~LOC~~ SUSP: 12.0000 [IU] | Freq: Two times a day (BID) | SUBCUTANEOUS | 3 refills | Status: DC

## 2017-02-23 MED ORDER — METFORMIN HCL 1000 MG PO TABS: 1000.0000 mg | ORAL_TABLET | Freq: Two times a day (BID) | ORAL | 0 refills | Status: DC

## 2017-02-23 MED ORDER — GLIMEPIRIDE 4 MG PO TABS: 4.0000 mg | ORAL_TABLET | Freq: Every day | ORAL | 0 refills | Status: DC

## 2017-02-23 MED ORDER — ASPIRIN 81 MG PO TBEC
81.0000 mg | DELAYED_RELEASE_TABLET | Freq: Every day | ORAL | 3 refills | Status: DC
Start: 2017-02-23 — End: 2017-03-23

## 2017-02-23 MED ORDER — METOPROLOL TARTRATE 100 MG PO TABS: 100.0000 mg | ORAL_TABLET | Freq: Two times a day (BID) | ORAL | 0 refills | Status: DC

## 2017-02-23 MED ORDER — LOSARTAN POTASSIUM 100 MG PO TABS: ORAL_TABLET | ORAL | 0 refills | Status: DC

## 2017-02-23 NOTE — Telephone Encounter (Signed)
Pt. Called requesting a refill on all her current medication. Please f/u °

## 2017-02-23 NOTE — Telephone Encounter (Signed)
Chronic medications refilled.

## 2017-03-02 ENCOUNTER — Other Ambulatory Visit: Payer: Self-pay | Admitting: *Deleted

## 2017-03-02 NOTE — Telephone Encounter (Signed)
OPENED FOR PASS PROGRAM

## 2017-03-22 MED FILL — LOSARTAN POTASSIUM 100 MG T: 100 | 30 days supply | Qty: 30 | Fill #3

## 2017-03-22 MED FILL — METOPROLOL TARTRATE 100 MG: 100 | 30 days supply | Qty: 60 | Fill #1

## 2017-03-22 MED FILL — SIMVASTATIN 40 MG TABLET: 40 | 30 days supply | Qty: 30 | Fill #1

## 2017-03-22 MED FILL — ?GABAPENTIN 100 MG CAP: 30 days supply | Qty: 90 | Fill #1

## 2017-03-22 MED FILL — GLIMEPIRIDE 4 MG TABLET: 4 | 30 days supply | Qty: 30 | Fill #1

## 2017-03-22 MED FILL — FLUoxetine HCL 20 MG CAPS: 20 | 30 days supply | Qty: 30 | Fill #1

## 2017-03-23 ENCOUNTER — Encounter: Payer: Self-pay | Admitting: Family Medicine

## 2017-03-23 ENCOUNTER — Ambulatory Visit: Payer: Self-pay | Attending: Family Medicine | Admitting: Family Medicine

## 2017-03-23 VITALS — BP 193/77 | HR 58 | Temp 98.2°F | Ht 62.5 in | Wt 147.8 lb

## 2017-03-23 DIAGNOSIS — E1149 Type 2 diabetes mellitus with other diabetic neurological complication: Secondary | ICD-10-CM | POA: Insufficient documentation

## 2017-03-23 DIAGNOSIS — F515 Nightmare disorder: Secondary | ICD-10-CM | POA: Insufficient documentation

## 2017-03-23 DIAGNOSIS — E118 Type 2 diabetes mellitus with unspecified complications: Secondary | ICD-10-CM

## 2017-03-23 DIAGNOSIS — E1151 Type 2 diabetes mellitus with diabetic peripheral angiopathy without gangrene: Secondary | ICD-10-CM | POA: Insufficient documentation

## 2017-03-23 DIAGNOSIS — E785 Hyperlipidemia, unspecified: Secondary | ICD-10-CM | POA: Insufficient documentation

## 2017-03-23 DIAGNOSIS — I739 Peripheral vascular disease, unspecified: Secondary | ICD-10-CM

## 2017-03-23 DIAGNOSIS — Z794 Long term (current) use of insulin: Secondary | ICD-10-CM | POA: Insufficient documentation

## 2017-03-23 DIAGNOSIS — I10 Essential (primary) hypertension: Secondary | ICD-10-CM | POA: Insufficient documentation

## 2017-03-23 DIAGNOSIS — Z79899 Other long term (current) drug therapy: Secondary | ICD-10-CM | POA: Insufficient documentation

## 2017-03-23 DIAGNOSIS — Z7982 Long term (current) use of aspirin: Secondary | ICD-10-CM | POA: Insufficient documentation

## 2017-03-23 DIAGNOSIS — F331 Major depressive disorder, recurrent, moderate: Secondary | ICD-10-CM | POA: Insufficient documentation

## 2017-03-23 LAB — GLUCOSE, POCT (MANUAL RESULT ENTRY): POC GLUCOSE: 160 mg/dL — AB (ref 70–99)

## 2017-03-23 LAB — POCT GLYCOSYLATED HEMOGLOBIN (HGB A1C): Hemoglobin A1C: 7.9

## 2017-03-23 MED ORDER — CARVEDILOL 25 MG PO TABS: 25.0000 mg | ORAL_TABLET | Freq: Two times a day (BID) | ORAL | 1 refills | Status: DC

## 2017-03-23 MED ORDER — LOSARTAN POTASSIUM 100 MG PO TABS: ORAL_TABLET | ORAL | 1 refills | Status: DC

## 2017-03-23 MED ORDER — ASPIRIN 81 MG PO TBEC
81.0000 mg | DELAYED_RELEASE_TABLET | Freq: Every day | ORAL | 3 refills | Status: DC
Start: 2017-03-23 — End: 2021-11-10

## 2017-03-23 MED ORDER — SIMVASTATIN 40 MG PO TABS: 40.0000 mg | ORAL_TABLET | Freq: Every day | ORAL | 0 refills | Status: DC

## 2017-03-23 MED ORDER — FLUOXETINE HCL 20 MG PO TABS: 40.0000 mg | ORAL_TABLET | Freq: Every day | ORAL | 1 refills | Status: DC

## 2017-03-23 MED ORDER — INSULIN NPH ISOPHANE & REGULAR (70-30) 100 UNIT/ML ~~LOC~~ SUSP: 10.0000 [IU] | Freq: Two times a day (BID) | SUBCUTANEOUS | 3 refills | Status: DC

## 2017-03-23 MED ORDER — CLONIDINE HCL 0.1 MG PO TABS: 0.1000 mg | ORAL_TABLET | Freq: Every day | ORAL | 1 refills | Status: DC

## 2017-03-23 MED ORDER — GLIMEPIRIDE 4 MG PO TABS: 4.0000 mg | ORAL_TABLET | Freq: Every day | ORAL | 1 refills | Status: DC

## 2017-03-23 MED ORDER — METFORMIN HCL 1000 MG PO TABS: 1000.0000 mg | ORAL_TABLET | Freq: Two times a day (BID) | ORAL | 1 refills | Status: DC

## 2017-03-23 MED ORDER — GABAPENTIN 300 MG PO CAPS: 300.0000 mg | ORAL_CAPSULE | Freq: Three times a day (TID) | ORAL | 1 refills | Status: DC

## 2017-03-23 MED FILL — ?METFORMIN HCL 1,000 MG TAB: 1000 | 30 days supply | Qty: 60 | Fill #0

## 2017-03-23 MED FILL — ?CLONIDINE HCL 0.1 MG TABL: 0.1 | 30 days supply | Qty: 30 | Fill #0

## 2017-03-23 MED FILL — ?CARVEDILOL 25 MG TABLET: 25 | 30 days supply | Qty: 60 | Fill #0

## 2017-03-23 NOTE — Patient Instructions (Signed)
Diabetes y cuidados del pie  (Diabetes and Foot Care)  La diabetes puede ser la causa de que el flujo sanguneo (circulacin) en las piernas y los pies sea deficiente. Debido a esto, la piel de los pies se torna ms delgada, se rompe con facilidad y se cura ms lentamente. La piel puede estar seca, despellejarse y agrietarse. Tambin pueden estar daados los nervios de las piernas y de los pies lo que provoca una disminucin de la sensibilidad. Es posible que no advierta heridas ms pequeas en los pies, que pueden causar infecciones graves. Cuidar sus pies es una de las cosas ms importantes que puede hacer por usted mismo.  INSTRUCCIONES PARA EL CUIDADO EN EL HOGAR   Use siempre calzado, an dentro de su casa. No camine descalzo. Caminar descalzo facilita que se lastime.   Controle sus pies diariamente para observar ampollas, cortes y enrojecimiento. Si no puede ver la planta del pie, use un espejo o pdale ayuda a otra persona.   Lave sus pies con agua tibia (no use agua caliente) y un jabn suave. Seque bien sus pies, y la zona entre los dedos dando palmaditas, hasta que estn completamente secos. Noremoje los pies, ya que esto puede resecar la piel.   Aplique una locin hidratante o vaselina (que no contenga alcohol ni perfume) en los pies y en las uas secas y quebradizas. No aplique locin entre los dedos.   Recorte las uas en forma recta. No escarbe debajo de las uas o alrededor de las cutculas. Lime los bordes de las uas con una lima o esmeril.   No corte las durezas o callosidades, ni trate de quitarlas con medicamentos.   Use calcetines de algodn o medias limpias todos los das. Asegrese de que no le ajusten demasiado. Nouse calcetines que le lleguen a las rodillas, ya que podran disminuir el flujo de sangre a las piernas.   Use zapatos de cuero que le queden bien y que sean acolchados. Para amoldar los zapatos, clcelos slo algunas horas por da. Esto evitar lesiones en los pies.  Revise siempre los zapatos antes de ponerlos para asegurarse de que no haya objetos en su interior.   No cruce las piernas. Esto puede disminuir el flujo de sangre a los pies.   Si algo le ha raspado, cortado o lastimado la piel de los pies, mantenga la piel de esa zona limpia y seca. Debe higienizar estas zonas con agua y un jabn suave. No limpie la zona con agua oxigenada, alcohol ni yodo.   Cuando se quite un vendaje adhesivo, asegrese de no daar la piel.   Si tiene una herida, obsrvela varias veces por da para asegurarse de que se est curando.   No use bolsas de agua caliente ni almohadillas trmicas. Podran causar quemaduras. Si ha perdido la sensibilidad en los pies o las piernas, no sabr lo que le est sucediendo hasta que sea demasiado tarde.   Asegrese de que su mdico le haga un examen completo de los pies por lo menos una vez al ao, o con ms frecuencia si usted tiene problemas en los pies. Informe todos los cortes, llagas o moretones a su mdico inmediatamente.    SOLICITE ATENCIN MDICA SI:   Tiene una lesin que no se cura.   Tiene cortes o rajaduras en la piel.   Tiene una ua encarnada.   Nota una zona irritada en las piernas o los pies.   Siente una sensacin de ardor u hormigueo en las piernas   o los pies.   Siente dolor o calambres en las piernas o los pies.   Las piernas o los pies estn adormecidos.   Siente los pies siempre fros.    SOLICITE ATENCIN MDICA DE INMEDIATO SI:   Presenta enrojecimiento, hinchazn o aumento del dolor en una herida.   Nota una lnea roja que sube por pierna.   Aparece pus en la herida.   Le sube la fiebre o segn lo que le indique el mdico.   Advierte un olor ftido que proviene de una lcera o una herida.    Esta informacin no tiene como fin reemplazar el consejo del mdico. Asegrese de hacerle al mdico cualquier pregunta que tenga.  Document Released: 12/14/2005 Document Revised: 04/06/2016 Document Reviewed: 05/23/2013  Elsevier  Interactive Patient Education  2017 [employer] Inc[employer]

## 2017-03-23 NOTE — Progress Notes (Signed)
Subjective:  Patient ID: Samantha [name], female    DOB: [birth year]  Age: 66 y.o. MRN: [id number]  CC: Diabetes and Hypertension   HPI Samantha [name] is a 66 year old female with a history of hypertension, type 2 diabetes mellitus (A1c 7.9 which has improved from 9.1 ), hyperlipidemia, depression peripheral vascular disease who comes into the clinic for a follow-up visit.  Endorses compliance with her medications but not a diabetic diet. She denies visual complaints or hypoglycemia. She does have neuropathy which is controlled on gabapentin.  Her blood pressure is elevated today despite compliance with her antihypertensives.  She complains of easy  irritability, worsening depression and anxiety but denies suicidal ideations or intents; she does have nightmares and has trouble sleeping. Remains on Prozac 20 mg but states she does not feel the effect.  Past Medical History:  Diagnosis Date  . Acute pyelonephritis   . Bacteremia, escherichia coli 01/12/2015  . Chest pain   . Critical lower limb ischemia   . Hyperlipidemia   . Hypertension   . Left carotid bruit   . PAD (peripheral artery disease) (HCC)   . Type II diabetes mellitus (HCC)     Past Surgical History:  Procedure Laterality Date  . ABDOMINAL ANGIOGRAM  05/16/2015   Procedure: Abdominal Angiogram;  Surgeon: Runell Gess, MD;  Location: [facility] INVASIVE CV LAB;  Service: Cardiovascular;;  . ABDOMINAL HYSTERECTOMY  ~ 2000  . PERIPHERAL VASCULAR CATHETERIZATION Bilateral 05/16/2015   Procedure: Lower Extremity Angiography;  Surgeon: Runell Gess, MD; renal arteries widely patent, L-SFA 75%, L-pop 95%, L-peroneal 90%; R-SFA 40%, R-pop 50%, R-prox peroneal 90%; directional atherectomy L-SFA, p-pop, drug-eluting balloon angioplasty reducing the stenoses to 0, small linear dissection was not flow limiting      . PERIPHERAL VASCULAR CATHETERIZATION Left 05/16/2015   Procedure: Peripheral Vascular Atherectomy;  Surgeon:  Runell Gess, MD;  Location: [facility] INVASIVE CV LAB;  Service: Cardiovascular;  Laterality: Left;  sfa    Outpatient Medications Prior to Visit  Medication Sig Dispense Refill  . Blood Glucose Monitoring Suppl (TRUE METRIX METER) DEVI 1 each by Does not apply route 3 (three) times daily before meals. 1 Device 0  . glucose blood (TRUE METRIX BLOOD GLUCOSE TEST) test strip Use 3 times daily before meals 100 each 12  . Insulin Pen Needle (PEN NEEDLES 3/16") 31G X 5 MM MISC 1 Stick by Does not apply route 2 (two) times daily at 8 am and 10 pm. 100 each 12  . Insulin Syringe-Needle U-100 (BD INSULIN SYRINGE ULTRAFINE) 31G X 15/64" 0.5 ML MISC Use subcutaneously twice daily 60 each 12  . TRUEPLUS LANCETS 28G MISC 1 each by Does not apply route 3 (three) times daily before meals. 100 each 12  . aspirin 81 MG EC tablet Take 1 tablet (81 mg total) by mouth daily. 30 tablet 3  . FLUoxetine (PROZAC) 20 MG tablet Take 1 tablet (20 mg total) by mouth daily. 90 tablet 0  . gabapentin (NEURONTIN) 100 MG capsule Take 1 capsule (100 mg total) by mouth 3 (three) times daily. 270 capsule 0  . glimepiride (AMARYL) 4 MG tablet Take 1 tablet (4 mg total) by mouth daily with breakfast. 90 tablet 0  . insulin NPH-regular Human (NOVOLIN 70/30) (70-30) 100 UNIT/ML injection Inject 12 Units into the skin 2 (two) times daily with a meal. 30 mL 3  . losartan (COZAAR) 100 MG tablet TAKE 1 TABLET BY MOUTH DAILY. 90 tablet 0  . metFORMIN (GLUCOPHAGE)  1000 MG tablet Take 1 tablet (1,000 mg total) by mouth 2 (two) times daily with a meal. 180 tablet 0  . metoprolol (LOPRESSOR) 100 MG tablet Take 1 tablet (100 mg total) by mouth 2 (two) times daily. 180 tablet 0  . simvastatin (ZOCOR) 40 MG tablet Take 1 tablet (40 mg total) by mouth at bedtime. 90 tablet 0  . ibuprofen (ADVIL,MOTRIN) 200 MG tablet Take 400 mg by mouth every 6 (six) hours as needed (pain/fever). Reported on 06/10/2016    . cephALEXin (KEFLEX) 500 MG capsule Take 1  capsule (500 mg total) by mouth 2 (two) times daily. 20 capsule 0   Facility-Administered Medications Prior to Visit  Medication Dose Route Frequency Provider Last Rate Last Dose  . valsartan-hydrochlorothiazide (DIOVAN-HCT) 160-12.5 MG per tablet 1 tablet  1 tablet Oral Daily Jaclyn ShaggyEnobong Amao, MD   1 tablet at 06/10/16 1110    ROS Review of Systems  Constitutional: Negative for activity change, appetite change and fatigue.  HENT: Negative for congestion, sinus pressure and sore throat.   Eyes: Negative for visual disturbance.  Respiratory: Negative for cough, chest tightness, shortness of breath and wheezing.   Cardiovascular: Negative for chest pain and palpitations.  Gastrointestinal: Negative for abdominal distention, abdominal pain and constipation.  Endocrine: Negative for polydipsia.  Genitourinary: Negative for dysuria and frequency.  Musculoskeletal: Negative for arthralgias and back pain.  Skin: Negative for rash.  Neurological: Negative for tremors, light-headedness and numbness.  Hematological: Does not bruise/bleed easily.  Psychiatric/Behavioral: Positive for dysphoric mood and sleep disturbance. Negative for agitation and behavioral problems.    Objective:  BP (!) 193/77 (BP Location: Right Arm, Patient Position: Sitting, Cuff Size: Small)   Pulse (!) 58   Temp 98.2 F (36.8 C) (Oral)   Ht 5' 2.5" (1.588 m)   Wt 147 lb 12.8 oz (67 kg)   SpO2 100%   BMI 26.60 kg/m   BP/Weight 03/23/2017 12/23/2016 06/10/2016  Systolic BP 193 187 181  Diastolic BP 77 81 84  Wt. (Lbs) 147.8 148.8 149.4  BMI 26.6 27.22 26.87      Physical Exam  Constitutional: She is oriented to person, place, and time. She appears well-developed and well-nourished.  Neck: No JVD present.  Cardiovascular: Normal heart sounds and intact distal pulses.  Bradycardia present.   No murmur heard. Pulmonary/Chest: Effort normal and breath sounds normal. She has no wheezes. She has no rales. She exhibits  no tenderness.  Abdominal: Soft. Bowel sounds are normal. She exhibits no distension and no mass. There is no tenderness.  Musculoskeletal: Normal range of motion.  Neurological: She is alert and oriented to person, place, and time.  Skin: Skin is warm and dry.  Psychiatric: She has a normal mood and affect.     Lab Results  Component Value Date   HGBA1C 7.9 03/23/2017    Assessment & Plan:   1. Type 2 diabetes mellitus with complication, with long-term current use of insulin (HCC) A1c of 7.9 Not fully optimized but this has improved from 9.1 previously Continue current medications, diabetic diet - Glucose (CBG) - HgB A1c - metFORMIN (GLUCOPHAGE) 1000 MG tablet; Take 1 tablet (1,000 mg total) by mouth 2 (two) times daily with a meal.  Dispense: 180 tablet; Refill: 1 - glimepiride (AMARYL) 4 MG tablet; Take 1 tablet (4 mg total) by mouth daily with breakfast.  Dispense: 90 tablet; Refill: 1 - insulin NPH-regular Human (NOVOLIN 70/30) (70-30) 100 UNIT/ML injection; Inject 10 Units into the skin 2 (  two) times daily with a meal.  Dispense: 30 mL; Refill: 3 - Comprehensive metabolic panel; Future - Lipid panel; Future - Microalbumin/Creatinine Ratio, Urine; Future  2. Essential hypertension Controlled Switch from metoprolol to carvedilol Reassess blood pressure at next visit - losartan (COZAAR) 100 MG tablet; TAKE 1 TABLET BY MOUTH DAILY.  Dispense: 90 tablet; Refill: 1 - carvedilol (COREG) 25 MG tablet; Take 1 tablet (25 mg total) by mouth 2 (two) times daily with a meal.  Dispense: 180 tablet; Refill: 1  3. PAD (peripheral artery disease) (HCC) Continue aspirin - simvastatin (ZOCOR) 40 MG tablet; Take 1 tablet (40 mg total) by mouth at bedtime.  Dispense: 90 tablet; Refill: 0 - aspirin 81 MG EC tablet; Take 1 tablet (81 mg total) by mouth daily.  Dispense: 30 tablet; Refill: 3  4. Moderate episode of recurrent major depressive disorder (HCC) Increased dose of Prozac due to  uncontrolled depression - FLUoxetine (PROZAC) 20 MG tablet; Take 2 tablets (40 mg total) by mouth daily.  Dispense: 180 tablet; Refill: 1  5. Nightmare disorder Questionable underlying postmenopausal symptoms We'll treat for nightmare - cloNIDine (CATAPRES) 0.1 MG tablet; Take 1 tablet (0.1 mg total) by mouth at bedtime.  Dispense: 90 tablet; Refill: 1  6. Type II diabetes mellitus with neurological manifestations (HCC) Stable - gabapentin (NEURONTIN) 300 MG capsule; Take 1 capsule (300 mg total) by mouth 3 (three) times daily.  Dispense: 90 capsule; Refill: 1   Meds ordered this encounter  Medications  . losartan (COZAAR) 100 MG tablet    Sig: TAKE 1 TABLET BY MOUTH DAILY.    Dispense:  90 tablet    Refill:  1  . simvastatin (ZOCOR) 40 MG tablet    Sig: Take 1 tablet (40 mg total) by mouth at bedtime.    Dispense:  90 tablet    Refill:  0  . metFORMIN (GLUCOPHAGE) 1000 MG tablet    Sig: Take 1 tablet (1,000 mg total) by mouth 2 (two) times daily with a meal.    Dispense:  180 tablet    Refill:  1  . glimepiride (AMARYL) 4 MG tablet    Sig: Take 1 tablet (4 mg total) by mouth daily with breakfast.    Dispense:  90 tablet    Refill:  1  . gabapentin (NEURONTIN) 300 MG capsule    Sig: Take 1 capsule (300 mg total) by mouth 3 (three) times daily.    Dispense:  90 capsule    Refill:  1  . aspirin 81 MG EC tablet    Sig: Take 1 tablet (81 mg total) by mouth daily.    Dispense:  30 tablet    Refill:  3  . FLUoxetine (PROZAC) 20 MG tablet    Sig: Take 2 tablets (40 mg total) by mouth daily.    Dispense:  180 tablet    Refill:  1  . insulin NPH-regular Human (NOVOLIN 70/30) (70-30) 100 UNIT/ML injection    Sig: Inject 10 Units into the skin 2 (two) times daily with a meal.    Dispense:  30 mL    Refill:  3  . carvedilol (COREG) 25 MG tablet    Sig: Take 1 tablet (25 mg total) by mouth 2 (two) times daily with a meal.    Dispense:  180 tablet    Refill:  1    Discontinue  metoprolol  . cloNIDine (CATAPRES) 0.1 MG tablet    Sig: Take 1 tablet (0.1 mg total) by  mouth at bedtime.    Dispense:  90 tablet    Refill:  1    Follow-up: Return in about 1 month (around 04/23/2017) for Follow-up on hypertension.   Jaclyn Shaggy MD

## 2017-03-23 NOTE — Progress Notes (Signed)
Only taking gabapentin BID

## 2017-03-24 ENCOUNTER — Ambulatory Visit: Payer: Self-pay | Attending: Family Medicine

## 2017-03-24 DIAGNOSIS — E118 Type 2 diabetes mellitus with unspecified complications: Secondary | ICD-10-CM | POA: Insufficient documentation

## 2017-03-24 DIAGNOSIS — Z794 Long term (current) use of insulin: Secondary | ICD-10-CM | POA: Insufficient documentation

## 2017-03-24 MED FILL — NOVOLIN 70/30 100 UNITS/ML: (70-30) 100 | 28 days supply | Qty: 10 | Fill #0

## 2017-03-24 MED FILL — TRUEPLUS SYR 0.3ML 30GX5/16: 30G X 5/16" | 50 days supply | Qty: 100 | Fill #0

## 2017-03-24 NOTE — Progress Notes (Signed)
Patient here for lab visit only 

## 2017-03-25 ENCOUNTER — Other Ambulatory Visit: Payer: Self-pay

## 2017-03-25 LAB — MICROALBUMIN / CREATININE URINE RATIO
CREATININE, UR: 102.3 mg/dL
Microalb/Creat Ratio: 1986.5 mg/g creat — ABNORMAL HIGH (ref 0.0–30.0)
Microalbumin, Urine: 2032.2 ug/mL

## 2017-03-25 LAB — LIPID PANEL
CHOL/HDL RATIO: 2.7 ratio (ref 0.0–4.4)
Cholesterol, Total: 133 mg/dL (ref 100–199)
HDL: 49 mg/dL (ref >39)
LDL Calculated: 59 mg/dL (ref 0–99)
TRIGLYCERIDES: 126 mg/dL (ref 0–149)
VLDL Cholesterol Cal: 25 mg/dL (ref 5–40)

## 2017-03-25 LAB — COMPREHENSIVE METABOLIC PANEL
A/G RATIO: 1.5 (ref 1.2–2.2)
ALT: 21 IU/L (ref 0–32)
AST: 21 IU/L (ref 0–40)
Albumin: 4.1 g/dL (ref 3.6–4.8)
Alkaline Phosphatase: 91 IU/L (ref 39–117)
BUN/Creatinine Ratio: 20 (ref 12–28)
BUN: 23 mg/dL (ref 8–27)
Bilirubin Total: 0.3 mg/dL (ref 0.0–1.2)
CALCIUM: 9.5 mg/dL (ref 8.7–10.3)
CO2: 22 mmol/L (ref 18–29)
Chloride: 101 mmol/L (ref 96–106)
Creatinine, Ser: 1.13 mg/dL — ABNORMAL HIGH (ref 0.57–1.00)
GFR, EST AFRICAN AMERICAN: 60 mL/min/{1.73_m2} (ref >59)
GFR, EST NON AFRICAN AMERICAN: 52 mL/min/{1.73_m2} — AB (ref >59)
Globulin, Total: 2.8 g/dL (ref 1.5–4.5)
Glucose: 145 mg/dL — ABNORMAL HIGH (ref 65–99)
POTASSIUM: 4.6 mmol/L (ref 3.5–5.2)
Sodium: 140 mmol/L (ref 134–144)
TOTAL PROTEIN: 6.9 g/dL (ref 6.0–8.5)

## 2017-03-25 MED ORDER — INSULIN ISOPHANE & REGULAR (HUMAN 70-30)100 UNIT/ML KWIKPEN: 12.0000 [IU] | PEN_INJECTOR | Freq: Two times a day (BID) | SUBCUTANEOUS | 3 refills | Status: DC

## 2017-03-30 ENCOUNTER — Ambulatory Visit: Payer: Self-pay | Attending: Family Medicine

## 2017-03-30 ENCOUNTER — Telehealth: Payer: Self-pay

## 2017-03-30 NOTE — Telephone Encounter (Signed)
Through [employer] patient was called and labs were discussed.  Patient's daughter stated understanding.

## 2017-03-30 NOTE — Telephone Encounter (Signed)
-----   Message from Jaclyn Shaggy, MD sent at 03/25/2017  2:54 PM EDT ----- Cholesterol is normal, labs reveal microalbuminuria which is an early sign of diabetes affecting the kidneys. Placing coverage tight glycemic control.

## 2017-04-27 ENCOUNTER — Ambulatory Visit: Payer: Self-pay | Attending: Family Medicine | Admitting: Family Medicine

## 2017-04-27 ENCOUNTER — Encounter: Payer: Self-pay | Admitting: Family Medicine

## 2017-04-27 VITALS — BP 192/82 | HR 60 | Temp 98.2°F | Resp 16 | Ht 63.0 in | Wt 140.0 lb

## 2017-04-27 DIAGNOSIS — Z7982 Long term (current) use of aspirin: Secondary | ICD-10-CM | POA: Insufficient documentation

## 2017-04-27 DIAGNOSIS — Z794 Long term (current) use of insulin: Secondary | ICD-10-CM | POA: Insufficient documentation

## 2017-04-27 DIAGNOSIS — F419 Anxiety disorder, unspecified: Secondary | ICD-10-CM | POA: Insufficient documentation

## 2017-04-27 DIAGNOSIS — E1151 Type 2 diabetes mellitus with diabetic peripheral angiopathy without gangrene: Secondary | ICD-10-CM | POA: Insufficient documentation

## 2017-04-27 DIAGNOSIS — E119 Type 2 diabetes mellitus without complications: Secondary | ICD-10-CM

## 2017-04-27 DIAGNOSIS — E785 Hyperlipidemia, unspecified: Secondary | ICD-10-CM | POA: Insufficient documentation

## 2017-04-27 DIAGNOSIS — F329 Major depressive disorder, single episode, unspecified: Secondary | ICD-10-CM | POA: Insufficient documentation

## 2017-04-27 DIAGNOSIS — I10 Essential (primary) hypertension: Secondary | ICD-10-CM | POA: Insufficient documentation

## 2017-04-27 DIAGNOSIS — Z79899 Other long term (current) drug therapy: Secondary | ICD-10-CM | POA: Insufficient documentation

## 2017-04-27 LAB — GLUCOSE, POCT (MANUAL RESULT ENTRY)

## 2017-04-27 MED ORDER — NIFEDIPINE ER OSMOTIC RELEASE 60 MG PO TB24: 60.0000 mg | ORAL_TABLET | Freq: Every day | ORAL | 3 refills | Status: DC

## 2017-04-27 MED ORDER — BUSPIRONE HCL 7.5 MG PO TABS: 7.5000 mg | ORAL_TABLET | Freq: Two times a day (BID) | ORAL | 3 refills | Status: DC

## 2017-04-27 MED FILL — ?METFORMIN HCL 1,000 MG TAB: 1000 | 30 days supply | Qty: 60 | Fill #1

## 2017-04-27 MED FILL — GLIMEPIRIDE 4 MG TABLET: 4 | 30 days supply | Qty: 30 | Fill #2

## 2017-04-27 MED FILL — CARVEDILOL 25 MG TABLET: 25 | 30 days supply | Qty: 60 | Fill #1

## 2017-04-27 MED FILL — SIMVASTATIN 40 MG TABLET: 40 | 30 days supply | Qty: 30 | Fill #2

## 2017-04-27 MED FILL — FLUoxetine HCL 20 MG CAPS: 20 | 30 days supply | Qty: 30 | Fill #2

## 2017-04-27 MED FILL — ?CLONIDINE HCL 0.1 MG TABL: 0.1 | 30 days supply | Qty: 30 | Fill #1

## 2017-04-27 MED FILL — LOSARTAN POTASSIUM 100 MG T: 100 | 30 days supply | Qty: 30 | Fill #4

## 2017-04-27 MED FILL — SIMVASTATIN 40 MG TABLET: 40 | 30 days supply | Qty: 30 | Fill #0

## 2017-04-27 MED FILL — GABAPENTIN 100 MG CAPSULE: 100 | 30 days supply | Qty: 90 | Fill #2

## 2017-04-27 NOTE — Progress Notes (Signed)
F/U DM Pt none fasting  Glucose running 109-260 at home No pain today  No suicidal thought in the past two weeks  No tobacco user  ETOH social  No drugs

## 2017-04-27 NOTE — Progress Notes (Signed)
Subjective:  Patient ID: Samantha [name], female    DOB: Mar 23, 1951  Age: 66 y.o. MRN: [id number]  CC: Hypertension and Diabetes   HPI Samantha [name] is a 66 year old female with a history of hypertension, type 2 diabetes mellitus (A1c 7.9 which has improved from 9.1 ), hyperlipidemia, depression peripheral vascular disease who comes into the clinic for a follow-up Of her hypertension.  Her blood pressure was elevated at her last visit and is 192/82 today despite her claiming compliance with her antihypertensive. She has not been compliant with a low-sodium diet: Diabetic diet. She denies chest pains, shortness of breath and has no other concerns today.  Past Medical History:  Diagnosis Date  . Acute pyelonephritis   . Bacteremia, escherichia coli 01/12/2015  . Chest pain   . Critical lower limb ischemia   . Hyperlipidemia   . Hypertension   . Left carotid bruit   . PAD (peripheral artery disease) (HCC)   . Type II diabetes mellitus (HCC)     Past Surgical History:  Procedure Laterality Date  . ABDOMINAL ANGIOGRAM  05/16/2015   Procedure: Abdominal Angiogram;  Surgeon: Runell Gess, MD;  Location: [facility] INVASIVE CV LAB;  Service: Cardiovascular;;  . ABDOMINAL HYSTERECTOMY  ~ 2000  . PERIPHERAL VASCULAR CATHETERIZATION Bilateral 05/16/2015   Procedure: Lower Extremity Angiography;  Surgeon: Runell Gess, MD; renal arteries widely patent, L-SFA 75%, L-pop 95%, L-peroneal 90%; R-SFA 40%, R-pop 50%, R-prox peroneal 90%; directional atherectomy L-SFA, p-pop, drug-eluting balloon angioplasty reducing the stenoses to 0, small linear dissection was not flow limiting      . PERIPHERAL VASCULAR CATHETERIZATION Left 05/16/2015   Procedure: Peripheral Vascular Atherectomy;  Surgeon: Runell Gess, MD;  Location: [facility] INVASIVE CV LAB;  Service: Cardiovascular;  Laterality: Left;  sfa    No Known Allergies    Outpatient Medications Prior to Visit  Medication Sig Dispense  Refill  . aspirin 81 MG EC tablet Take 1 tablet (81 mg total) by mouth daily. 30 tablet 3  . Blood Glucose Monitoring Suppl (TRUE METRIX METER) DEVI 1 each by Does not apply route 3 (three) times daily before meals. 1 Device 0  . carvedilol (COREG) 25 MG tablet Take 1 tablet (25 mg total) by mouth 2 (two) times daily with a meal. 180 tablet 1  . cloNIDine (CATAPRES) 0.1 MG tablet Take 1 tablet (0.1 mg total) by mouth at bedtime. 90 tablet 1  . FLUoxetine (PROZAC) 20 MG tablet Take 2 tablets (40 mg total) by mouth daily. 180 tablet 1  . gabapentin (NEURONTIN) 300 MG capsule Take 1 capsule (300 mg total) by mouth 3 (three) times daily. 90 capsule 1  . glimepiride (AMARYL) 4 MG tablet Take 1 tablet (4 mg total) by mouth daily with breakfast. 90 tablet 1  . glucose blood (TRUE METRIX BLOOD GLUCOSE TEST) test strip Use 3 times daily before meals 100 each 12  . ibuprofen (ADVIL,MOTRIN) 200 MG tablet Take 400 mg by mouth every 6 (six) hours as needed (pain/fever). Reported on 06/10/2016    . Insulin Isophane & Regular Human (HUMULIN 70/30 KWIKPEN) (70-30) 100 UNIT/ML PEN Inject 12 Units into the skin 2 (two) times daily with a meal. 30 mL 3  . insulin NPH-regular Human (NOVOLIN 70/30) (70-30) 100 UNIT/ML injection Inject 10 Units into the skin 2 (two) times daily with a meal. 30 mL 3  . Insulin Pen Needle (PEN NEEDLES 3/16") 31G X 5 MM MISC 1 Stick by Does not apply route 2 (  two) times daily at 8 am and 10 pm. 100 each 12  . Insulin Syringe-Needle U-100 (BD INSULIN SYRINGE ULTRAFINE) 31G X 15/64" 0.5 ML MISC Use subcutaneously twice daily 60 each 12  . losartan (COZAAR) 100 MG tablet TAKE 1 TABLET BY MOUTH DAILY. 90 tablet 1  . metFORMIN (GLUCOPHAGE) 1000 MG tablet Take 1 tablet (1,000 mg total) by mouth 2 (two) times daily with a meal. 180 tablet 1  . simvastatin (ZOCOR) 40 MG tablet Take 1 tablet (40 mg total) by mouth at bedtime. 90 tablet 0  . TRUEPLUS LANCETS 28G MISC 1 each by Does not apply route 3  (three) times daily before meals. 100 each 12   Facility-Administered Medications Prior to Visit  Medication Dose Route Frequency Provider Last Rate Last Dose  . valsartan-hydrochlorothiazide (DIOVAN-HCT) 160-12.5 MG per tablet 1 tablet  1 tablet Oral Daily Jaclyn Shaggy, MD   1 tablet at 06/10/16 1110    ROS Review of Systems  Constitutional: Negative for activity change and appetite change.  HENT: Negative for sinus pressure and sore throat.   Respiratory: Negative for chest tightness, shortness of breath and wheezing.   Cardiovascular: Negative for chest pain and palpitations.  Gastrointestinal: Negative for abdominal distention, abdominal pain and constipation.  Genitourinary: Negative.   Musculoskeletal: Negative.   Psychiatric/Behavioral: Negative for behavioral problems and dysphoric mood.    Objective:  BP (!) 192/82 (BP Location: Left Arm, Patient Position: Sitting, Cuff Size: Normal)   Pulse 60   Temp 98.2 F (36.8 C) (Oral)   Resp 16   Ht  (1.6 m)   Wt 140 lb (63.5 kg)   SpO2 100%   BMI 24.80 kg/m   BP/Weight 04/27/2017 03/23/2017 12/23/2016  Systolic BP 192 193 187  Diastolic BP 82 77 81  Wt. (Lbs) 140 147.8 148.8  BMI 24.8 26.6 27.22    Physical Exam Constitutional: She is oriented to person, place, and time. She appears well-developed and well-nourished.  Neck: No JVD present.  Cardiovascular: Normal heart rate and heart sounds and intact distal pulses.  No murmur heard. Pulmonary/Chest: Effort normal and breath sounds normal. She has no wheezes. She has no rales. She exhibits no tenderness.  Abdominal: Soft. Bowel sounds are normal. She exhibits no distension and no mass. There is no tenderness.  Musculoskeletal: Normal range of motion.  Neurological: She is alert and oriented to person, place, and time.  Skin: Skin is warm and dry.  Psychiatric: She has a normal mood and affect.  Lab Results  Component Value Date   HGBA1C 7.9 03/23/2017     Assessment & Plan:   1. Type 2 diabetes mellitus without complication, with long-term current use of insulin (HCC) Not fully optimized with A1c of 7.9 but this has improved compared to previous labs Continue current regimen Diabetic diet - POCT glucose (manual entry)  2. Anxiety Uncontrolled Nifedipine added to her regimen Low-sodium diet Continue carvedilol and losartan   Meds ordered this encounter  Medications  . NIFEdipine (PROCARDIA XL/ADALAT-CC) 60 MG 24 hr tablet    Sig: Take 1 tablet (60 mg total) by mouth daily.    Dispense:  30 tablet    Refill:  3  . busPIRone (BUSPAR) 7.5 MG tablet    Sig: Take 1 tablet (7.5 mg total) by mouth 2 (two) times daily.    Dispense:  60 tablet    Refill:  3    Follow-up: Return in about 1 month (around 05/28/2017) for follow up on  Hypertension.   Jaclyn Shaggy MD

## 2017-04-27 NOTE — Patient Instructions (Addendum)

## 2017-04-28 MED FILL — NIFEDIPINE ER 60 MG TABLET: 60 | 30 days supply | Qty: 30 | Fill #0

## 2017-04-28 MED FILL — busPIRone HCL 7.5 MG TABS: 7.5 | 30 days supply | Qty: 60 | Fill #0

## 2017-06-01 ENCOUNTER — Other Ambulatory Visit: Payer: Self-pay | Admitting: Family Medicine

## 2017-06-01 DIAGNOSIS — I10 Essential (primary) hypertension: Secondary | ICD-10-CM

## 2017-06-01 MED FILL — metFORMIN HCL 1000 MG TABS: 1000 | 30 days supply | Qty: 60 | Fill #2

## 2017-06-01 MED FILL — CARVEDILOL 25 MG TABLET: 25 | 30 days supply | Qty: 60 | Fill #2

## 2017-06-01 MED FILL — LOSARTAN POTASSIUM 100 MG T: 100 | 30 days supply | Qty: 30 | Fill #5

## 2017-06-01 MED FILL — busPIRone HCL 7.5 MG TABS: 7.5 | 30 days supply | Qty: 60 | Fill #1

## 2017-06-01 MED FILL — ?FLUOXETINE HCL 20 MG CAP: 20 | 30 days supply | Qty: 30 | Fill #3

## 2017-06-01 MED FILL — GLIMEPIRIDE 4 MG TABLET: 4 | 30 days supply | Qty: 30 | Fill #3

## 2017-06-01 MED FILL — ?CLONIDINE HCL 0.1 MG TABL: 0.1 | 30 days supply | Qty: 30 | Fill #2

## 2017-06-01 MED FILL — GABAPENTIN 100 MG CAPSULE: 100 | 30 days supply | Qty: 90 | Fill #3

## 2017-06-04 ENCOUNTER — Ambulatory Visit: Payer: Self-pay | Attending: Family Medicine | Admitting: Family Medicine

## 2017-06-04 ENCOUNTER — Encounter: Payer: Self-pay | Admitting: Family Medicine

## 2017-06-04 VITALS — BP 167/90 | HR 67 | Temp 98.1°F | Resp 16 | Ht 64.0 in | Wt 147.0 lb

## 2017-06-04 DIAGNOSIS — I10 Essential (primary) hypertension: Secondary | ICD-10-CM | POA: Insufficient documentation

## 2017-06-04 DIAGNOSIS — Z7982 Long term (current) use of aspirin: Secondary | ICD-10-CM | POA: Insufficient documentation

## 2017-06-04 DIAGNOSIS — E118 Type 2 diabetes mellitus with unspecified complications: Secondary | ICD-10-CM

## 2017-06-04 DIAGNOSIS — F329 Major depressive disorder, single episode, unspecified: Secondary | ICD-10-CM | POA: Insufficient documentation

## 2017-06-04 DIAGNOSIS — Z794 Long term (current) use of insulin: Secondary | ICD-10-CM | POA: Insufficient documentation

## 2017-06-04 DIAGNOSIS — Z9119 Patient's noncompliance with other medical treatment and regimen: Secondary | ICD-10-CM | POA: Insufficient documentation

## 2017-06-04 DIAGNOSIS — E1151 Type 2 diabetes mellitus with diabetic peripheral angiopathy without gangrene: Secondary | ICD-10-CM | POA: Insufficient documentation

## 2017-06-04 DIAGNOSIS — E785 Hyperlipidemia, unspecified: Secondary | ICD-10-CM | POA: Insufficient documentation

## 2017-06-04 LAB — GLUCOSE, POCT (MANUAL RESULT ENTRY): POC GLUCOSE: 156 mg/dL — AB (ref 70–99)

## 2017-06-04 NOTE — Progress Notes (Signed)
F/U DM Pt stated didn't take BP medication today  Glucose running 200-117 No tobacco user  No pain today  No suicidal thoughts in the past two weeks

## 2017-06-04 NOTE — Progress Notes (Signed)
Subjective:  Patient ID: Samantha [name], female    DOB: 1951/03/15  Age: 66 y.o. MRN: [id number]  CC: Diabetes   HPI Alianys Chacko s a 66 year old female with a history of hypertension, type 2 diabetes mellitus (A1c 7.9 which has improved from 9.1 ), hyperlipidemia, depression peripheral vascular disease who comes into the clinic for a follow-up Of her hypertension.  She has not been compliant with her medications as she informs me that she stopped taking nifedipine, BuSpar and carvedilol as they all caused palpitations and caused her not to feel well. She denies chest pains or shortness of breath  Past Medical History:  Diagnosis Date  . Acute pyelonephritis   . Bacteremia, escherichia coli 01/12/2015  . Chest pain   . Critical lower limb ischemia   . Hyperlipidemia   . Hypertension   . Left carotid bruit   . PAD (peripheral artery disease) (HCC)   . Type II diabetes mellitus (HCC)     Past Surgical History:  Procedure Laterality Date  . ABDOMINAL ANGIOGRAM  05/16/2015   Procedure: Abdominal Angiogram;  Surgeon: Runell Gess, MD;  Location: [facility] INVASIVE CV LAB;  Service: Cardiovascular;;  . ABDOMINAL HYSTERECTOMY  ~ 2000  . PERIPHERAL VASCULAR CATHETERIZATION Bilateral 05/16/2015   Procedure: Lower Extremity Angiography;  Surgeon: Runell Gess, MD; renal arteries widely patent, L-SFA 75%, L-pop 95%, L-peroneal 90%; R-SFA 40%, R-pop 50%, R-prox peroneal 90%; directional atherectomy L-SFA, p-pop, drug-eluting balloon angioplasty reducing the stenoses to 0, small linear dissection was not flow limiting      . PERIPHERAL VASCULAR CATHETERIZATION Left 05/16/2015   Procedure: Peripheral Vascular Atherectomy;  Surgeon: Runell Gess, MD;  Location: [facility] INVASIVE CV LAB;  Service: Cardiovascular;  Laterality: Left;  sfa    No Known Allergies   Outpatient Medications Prior to Visit  Medication Sig Dispense Refill  . aspirin 81 MG EC tablet Take 1 tablet (81 mg total)  by mouth daily. 30 tablet 3  . Blood Glucose Monitoring Suppl (TRUE METRIX METER) DEVI 1 each by Does not apply route 3 (three) times daily before meals. 1 Device 0  . busPIRone (BUSPAR) 7.5 MG tablet Take 1 tablet (7.5 mg total) by mouth 2 (two) times daily. 60 tablet 3  . carvedilol (COREG) 25 MG tablet Take 1 tablet (25 mg total) by mouth 2 (two) times daily with a meal. 180 tablet 1  . cloNIDine (CATAPRES) 0.1 MG tablet Take 1 tablet (0.1 mg total) by mouth at bedtime. 90 tablet 1  . FLUoxetine (PROZAC) 20 MG tablet Take 2 tablets (40 mg total) by mouth daily. 180 tablet 1  . gabapentin (NEURONTIN) 300 MG capsule Take 1 capsule (300 mg total) by mouth 3 (three) times daily. 90 capsule 1  . glimepiride (AMARYL) 4 MG tablet Take 1 tablet (4 mg total) by mouth daily with breakfast. 90 tablet 1  . glucose blood (TRUE METRIX BLOOD GLUCOSE TEST) test strip Use 3 times daily before meals 100 each 12  . ibuprofen (ADVIL,MOTRIN) 200 MG tablet Take 400 mg by mouth every 6 (six) hours as needed (pain/fever). Reported on 06/10/2016    . Insulin Isophane & Regular Human (HUMULIN 70/30 KWIKPEN) (70-30) 100 UNIT/ML PEN Inject 12 Units into the skin 2 (two) times daily with a meal. 30 mL 3  . insulin NPH-regular Human (NOVOLIN 70/30) (70-30) 100 UNIT/ML injection Inject 10 Units into the skin 2 (two) times daily with a meal. 30 mL 3  . Insulin Pen Needle (PEN  NEEDLES 3/16") 31G X 5 MM MISC 1 Stick by Does not apply route 2 (two) times daily at 8 am and 10 pm. 100 each 12  . Insulin Syringe-Needle U-100 (BD INSULIN SYRINGE ULTRAFINE) 31G X 15/64" 0.5 ML MISC Use subcutaneously twice daily 60 each 12  . losartan (COZAAR) 100 MG tablet TAKE 1 TABLET BY MOUTH DAILY. 90 tablet 1  . metFORMIN (GLUCOPHAGE) 1000 MG tablet Take 1 tablet (1,000 mg total) by mouth 2 (two) times daily with a meal. 180 tablet 1  . NIFEdipine (PROCARDIA XL/ADALAT-CC) 60 MG 24 hr tablet Take 1 tablet (60 mg total) by mouth daily. 30 tablet 3    . simvastatin (ZOCOR) 40 MG tablet Take 1 tablet (40 mg total) by mouth at bedtime. 90 tablet 0  . TRUEPLUS LANCETS 28G MISC 1 each by Does not apply route 3 (three) times daily before meals. 100 each 12   Facility-Administered Medications Prior to Visit  Medication Dose Route Frequency Provider Last Rate Last Dose  . valsartan-hydrochlorothiazide (DIOVAN-HCT) 160-12.5 MG per tablet 1 tablet  1 tablet Oral Daily Jaclyn ShaggyAmao, Dejha King, MD   1 tablet at 06/10/16 1110    ROS Review of Systems  Constitutional: Negative for activity change, appetite change and fatigue.  HENT: Negative for congestion, sinus pressure and sore throat.   Eyes: Negative for visual disturbance.  Respiratory: Negative for cough, chest tightness, shortness of breath and wheezing.   Cardiovascular: Negative for chest pain and palpitations.  Gastrointestinal: Negative for abdominal distention, abdominal pain and constipation.  Endocrine: Negative for polydipsia.  Genitourinary: Negative for dysuria and frequency.  Musculoskeletal: Negative for arthralgias and back pain.  Skin: Negative for rash.  Neurological: Negative for tremors, light-headedness and numbness.  Hematological: Does not bruise/bleed easily.  Psychiatric/Behavioral: Negative for agitation and behavioral problems.    Objective:  BP (!) 167/90 (BP Location: Right Arm, Patient Position: Sitting, Cuff Size: Normal)   Pulse 67   Temp 98.1 F (36.7 C) (Oral)   Resp 16   Ht 5\' 4"  (1.626 m)   Wt 147 lb (66.7 kg)   SpO2 100%   BMI 25.23 kg/m   BP/Weight 06/04/2017 04/27/2017 03/23/2017  Systolic BP 167 192 193  Diastolic BP 90 82 77  Wt. (Lbs) 147 140 147.8  BMI 25.23 24.8 26.6      Physical Exam Constitutional: She is oriented to person, place, and time. She appears well-developed and well-nourished.  Neck: No JVD present.  Cardiovascular: Normal heart rate and heart sounds and intact distal pulses.  No murmur heard. Pulmonary/Chest: Effort normal  and breath sounds normal. She has no wheezes. She has no rales. She exhibits no tenderness.  Abdominal: Soft. Bowel sounds are normal. She exhibits no distension and no mass. There is no tenderness.  Musculoskeletal: Normal range of motion.  Neurological: She is alert and oriented to person, place, and time.  Skin: Skin is warm and dry.  Psychiatric: She has a normal mood and affect.   Lab Results  Component Value Date   HGBA1C 7.9 03/23/2017    Assessment & Plan:   1. Type 2 diabetes mellitus with complication, with long-term current use of insulin (HCC) A1c of 7.9 Work on tight glycemic control with a goal of less than 7.0 Continue current regimen and diabetic diet - POCT glucose (manual entry)  2. Essential hypertension Uncontrolled due to noncompliance I have advised her to hold off on Buspar due to the above symptoms she complained of Continue all antihypertensives We'll  work on elimination at her next visit and adjustment of regimen to ensure better blood pressure control Low-sodium diet  This note has been created with Education officer, environmental. Any transcriptional errors are unintentional.       No orders of the defined types were placed in this encounter.   Follow-up: Return in about 3 weeks (around 06/25/2017) for Follow-up on hypertension.   Jaclyn Shaggy MD

## 2017-06-04 NOTE — Patient Instructions (Signed)

## 2017-06-24 ENCOUNTER — Other Ambulatory Visit: Payer: Self-pay | Admitting: Family Medicine

## 2017-06-24 DIAGNOSIS — I10 Essential (primary) hypertension: Secondary | ICD-10-CM

## 2017-06-24 MED FILL — SIMVASTATIN 40 MG TABLET: 40 | 30 days supply | Qty: 30 | Fill #1

## 2017-06-24 MED FILL — NOVOLIN 70/30 100 UNITS/ML: (70-30) 100 | 28 days supply | Qty: 10 | Fill #1

## 2017-06-28 ENCOUNTER — Encounter: Payer: Self-pay | Admitting: Family Medicine

## 2017-06-28 ENCOUNTER — Ambulatory Visit: Payer: Self-pay | Attending: Family Medicine | Admitting: Family Medicine

## 2017-06-28 VITALS — BP 132/72 | HR 67 | Temp 99.2°F | Resp 18 | Ht 62.0 in | Wt 150.0 lb

## 2017-06-28 DIAGNOSIS — Z9889 Other specified postprocedural states: Secondary | ICD-10-CM | POA: Insufficient documentation

## 2017-06-28 DIAGNOSIS — E1151 Type 2 diabetes mellitus with diabetic peripheral angiopathy without gangrene: Secondary | ICD-10-CM | POA: Insufficient documentation

## 2017-06-28 DIAGNOSIS — F419 Anxiety disorder, unspecified: Secondary | ICD-10-CM | POA: Insufficient documentation

## 2017-06-28 DIAGNOSIS — E118 Type 2 diabetes mellitus with unspecified complications: Secondary | ICD-10-CM

## 2017-06-28 DIAGNOSIS — E785 Hyperlipidemia, unspecified: Secondary | ICD-10-CM | POA: Insufficient documentation

## 2017-06-28 DIAGNOSIS — R002 Palpitations: Secondary | ICD-10-CM | POA: Insufficient documentation

## 2017-06-28 DIAGNOSIS — Z90710 Acquired absence of both cervix and uterus: Secondary | ICD-10-CM | POA: Insufficient documentation

## 2017-06-28 DIAGNOSIS — F329 Major depressive disorder, single episode, unspecified: Secondary | ICD-10-CM | POA: Insufficient documentation

## 2017-06-28 DIAGNOSIS — I10 Essential (primary) hypertension: Secondary | ICD-10-CM | POA: Insufficient documentation

## 2017-06-28 DIAGNOSIS — Z794 Long term (current) use of insulin: Secondary | ICD-10-CM | POA: Insufficient documentation

## 2017-06-28 LAB — POCT URINALYSIS DIPSTICK
Bilirubin, UA: NEGATIVE
Glucose, UA: 250
KETONE: NEGATIVE
Nitrite, UA: NEGATIVE
PH UA: 6 (ref 5.0–8.0)
Protein, UA: >=300
SPEC GRAV UA: 1.01 (ref 1.010–1.025)
Urobilinogen, UA: 0.2 E.U./dL

## 2017-06-28 LAB — POCT UA - MICROALBUMIN
CREATININE, POC: 50 mg/dL
MICROALBUMIN (UR) POC: 150 mg/L

## 2017-06-28 LAB — GLUCOSE, POCT (MANUAL RESULT ENTRY): POC GLUCOSE: 299 mg/dL — AB (ref 70–99)

## 2017-06-28 NOTE — Patient Instructions (Signed)
La diabetes mellitus y los alimentos (Diabetes Mellitus and Food) Es importante que controle su nivel de azcar en la sangre (glucosa). El nivel de glucosa en sangre depende en gran medida de lo que usted come. Comer alimentos saludables en las cantidades adecuadas a lo largo del da, aproximadamente a la misma hora todos los das, lo ayudar a controlar su nivel de glucosa en sangre. Tambin puede ayudarlo a retrasar o evitar el empeoramiento de la diabetes mellitus. Comer de manera saludable incluso puede ayudarlo a mejorar el nivel de presin arterial y a alcanzar o mantener un peso saludable. Entre las recomendaciones generales para alimentarse y cocinar los alimentos de forma saludable, se incluyen las siguientes:  Respetar las comidas principales y comer colaciones con regularidad. Evitar pasar largos perodos sin comer con el fin de perder peso.  Seguir una dieta que consista principalmente en alimentos de origen vegetal, como frutas, vegetales, frutos secos, legumbres y cereales integrales.  Utilizar mtodos de coccin a baja temperatura, como hornear, en lugar de mtodos de coccin a alta temperatura, como frer en abundante aceite. Trabaje con el nutricionista para aprender a usar la informacin nutricional de las etiquetas de los alimentos. CMO PUEDEN AFECTARME LOS ALIMENTOS? Carbohidratos Los carbohidratos afectan el nivel de glucosa en sangre ms que cualquier otro tipo de alimento. El nutricionista lo ayudar a determinar cuntos carbohidratos puede consumir en cada comida y ensearle a contarlos. El recuento de carbohidratos es importante para mantener la glucosa en sangre en un nivel saludable, en especial si utiliza insulina o toma determinados medicamentos para la diabetes mellitus. Alcohol El alcohol puede provocar disminuciones sbitas de la glucosa en sangre (hipoglucemia), en especial si utiliza insulina o toma determinados medicamentos para la diabetes mellitus. La  hipoglucemia es una afeccin que puede poner en peligro la vida. Los sntomas de la hipoglucemia (somnolencia, mareos y desorientacin) son similares a los sntomas de haber consumido mucho alcohol. Si el mdico lo autoriza a beber alcohol, hgalo con moderacin y siga estas pautas:  Las mujeres no deben beber ms de un trago por da, y los hombres no deben beber ms de dos tragos por da. Un trago es igual a:  12 onzas (355 ml) de cerveza  5 onzas de vino (150 ml) de vino  1,5onzas (45ml) de bebidas espirituosas  No beba con el estmago vaco.  Mantngase hidratado. Beba agua, gaseosas dietticas o t helado sin azcar.  Las gaseosas comunes, los jugos y otros refrescos podran contener muchos carbohidratos y se deben contar. QU ALIMENTOS NO SE RECOMIENDAN? Cuando haga las elecciones de alimentos, es importante que recuerde que todos los alimentos son distintos. Algunos tienen menos nutrientes que otros por porcin, aunque podran tener la misma cantidad de caloras o carbohidratos. Es difcil darle al cuerpo lo que necesita cuando consume alimentos con menos nutrientes. Estos son algunos ejemplos de alimentos que debera evitar ya que contienen muchas caloras y carbohidratos, pero pocos nutrientes:  Grasas trans (la mayora de los alimentos procesados incluyen grasas trans en la etiqueta de Informacin nutricional).  Gaseosas comunes.  Jugos.  Caramelos.  Dulces, como tortas, pasteles, rosquillas y galletas.  Comidas fritas. QU ALIMENTOS PUEDO COMER? Consuma alimentos ricos en nutrientes, que nutrirn el cuerpo y lo mantendrn saludable. Los alimentos que debe comer tambin dependern de varios factores, como:  Las caloras que necesita.  Los medicamentos que toma.  Su peso.  El nivel de glucosa en sangre.  El nivel de presin arterial.  El nivel de colesterol. Debe consumir   una amplia variedad de alimentos, por ejemplo:  Protenas.  Cortes de carne  magros.  Protenas con bajo contenido de grasas saturadas, como pescado, clara de huevo y frijoles. Evite las carnes procesadas.  Frutas y vegetales.  Frutas y vegetales que pueden ayudar a controlar los niveles sanguneos de glucosa, como manzanas, mangos y batatas.  Productos lcteos.  Elija productos lcteos sin grasa o con bajo contenido de grasa, como leche, yogur y queso.  Cereales, panes, pastas y arroz.  Elija cereales integrales, como panes multicereales, avena en grano y arroz integral. Estos alimentos pueden ayudar a controlar la presin arterial.  Grasas.  Alimentos que contengan grasas saludables, como frutos secos, aguacate, aceite de oliva, aceite de canola y pescado. TODOS LOS QUE PADECEN DIABETES MELLITUS TIENEN EL MISMO PLAN DE COMIDAS? Dado que todas las personas que padecen diabetes mellitus son distintas, no hay un solo plan de comidas que funcione para todos. Es muy importante que se rena con un nutricionista que lo ayudar a crear un plan de comidas adecuado para usted. Esta informacin no tiene como fin reemplazar el consejo del mdico. Asegrese de hacerle al mdico cualquier pregunta que tenga. Document Released: 03/22/2008 Document Revised: 01/04/2015 Document Reviewed: 11/10/2013 Elsevier Interactive Patient Education  2017 Elsevier Inc.  

## 2017-06-28 NOTE — Progress Notes (Signed)
Subjective:    Patient ID: Samantha [name], female    DOB: [birth year], 66 y.o.   MRN: [id number]  HPI Samantha [name] is a 66 year old female with a history of hypertension, type 2 diabetes mellitus (A1c 7.9 which has improved from 9.1 ), hyperlipidemia, depression peripheral vascular disease who comes into the clinic for a follow-up Of her hypertension.  At her last visit she had complained of palpitations which she had attributed to BuSpar and carvedilol. BuSpar had been placed on hold and she presents today informing me that when she discontinued BuSpar and clonidine she felt much better and palpitations ceased. She had also noticed somnolence and fatigue with one of her medications and this has improved ever since she discontinued   Past Medical History:  Diagnosis Date  . Acute pyelonephritis   . Bacteremia, escherichia coli 01/12/2015  . Chest pain   . Critical lower limb ischemia   . Hyperlipidemia   . Hypertension   . Left carotid bruit   . PAD (peripheral artery disease) (HCC)   . Type II diabetes mellitus (HCC)     Past Surgical History:  Procedure Laterality Date  . ABDOMINAL ANGIOGRAM  05/16/2015   Procedure: Abdominal Angiogram;  Surgeon: Runell GessJonathan J Berry, MD;  [facility];  Service: Cardiovascular;;  . ABDOMINAL HYSTERECTOMY  ~ 2000  . PERIPHERAL VASCULAR CATHETERIZATION Bilateral 05/16/2015   Procedure: Lower Extremity Angiography;  Surgeon: Runell GessJonathan J Berry, MD; renal arteries widely patent, L-SFA 75%, L-pop 95%, L-peroneal 90%; R-SFA 40%, R-pop 50%, R-prox peroneal 90%; directional atherectomy L-SFA, p-pop, drug-eluting balloon angioplasty reducing the stenoses to 0, small linear dissection was not flow limiting      . PERIPHERAL VASCULAR CATHETERIZATION Left 05/16/2015   Procedure: Peripheral Vascular Atherectomy;  Surgeon: Runell GessJonathan J Berry, MD;  [facility];  Service: Cardiovascular;  Laterality: Left;  sfa    No Known  Allergies     Review of Systems Constitutional: Negative for activity change, appetite change and fatigue.  HENT: Negative for congestion, sinus pressure and sore throat.   Eyes: Negative for visual disturbance.  Respiratory: Negative for cough, chest tightness, shortness of breath and wheezing.   Cardiovascular: Negative for chest pain and palpitations.  Gastrointestinal: Negative for abdominal distention, abdominal pain and constipation.  Endocrine: Negative for polydipsia.  Genitourinary: Negative for dysuria and frequency.  Musculoskeletal: Negative for arthralgias and back pain.  Skin: Negative for rash.  Neurological: Negative for tremors, light-headedness and numbness.  Hematological: Does not bruise/bleed easily.  Psychiatric/Behavioral: Negative for agitation and behavioral problems.    Objective: Vitals:   06/28/17 1503  BP: 132/72  Pulse: 67  Resp: 18  Temp: 99.2 F (37.3 C)  TempSrc: Oral  SpO2: 97%  Weight: 150 lb (68 kg)  Height: 5\' 2"  (1.575 m)      Physical Exam Constitutional: She is oriented to person, place, and time. She appears well-developed and well-nourished.  Neck: No JVD present.  Cardiovascular: Normal heart rate and heart sounds and intact distal pulses.  No murmur heard. Pulmonary/Chest: Effort normal and breath sounds normal. She has no wheezes. She has no rales. She exhibits no tenderness.  Abdominal: Soft. Bowel sounds are normal. She exhibits no distension and no mass. There is no tenderness.  Musculoskeletal: Normal range of motion.  Neurological: She is alert and oriented to person, place, and time.  Skin: Skin is warm and dry.  Psychiatric: She has a normal mood and affect.   Lab Results  Component Value Date   HGBA1C 7.9 03/23/2017        Assessment & Plan:  1. Type 2 diabetes mellitus with complication, with long-term current use of insulin (HCC) A1c of 7.9 - Glucose (CBG) - POCT UA - Microalbumin - Urinalysis  Dipstick  2. Essential hypertension Controlled Discontinued clonidine  3. Anxiety and depression Discontinued BuSpar Continue Prozac No orders of the defined types were placed in this encounter.   Follow-up: Return in about 3 months (around 09/28/2017) for Follow-up on chronic medical conditions.   Jaclyn Shaggy MD

## 2017-06-28 NOTE — Progress Notes (Signed)
Patient is here for HTN FU  Patient denies pain at this time.  Patient has taken medication today. Patient has eaten today.   

## 2017-07-19 MED FILL — ?GLIMEPIRIDE 4 MG TABLET: 4 | 30 days supply | Qty: 30 | Fill #4

## 2017-07-19 MED FILL — LOSARTAN POTASSIUM 100 MG T: 100 | 30 days supply | Qty: 30 | Fill #0

## 2017-07-19 MED FILL — ?METFORMIN HCL 1,000 MG TAB: 1000 | 30 days supply | Qty: 60 | Fill #3

## 2017-07-19 MED FILL — GABAPENTIN 100 MG CAPSULE: 100 | 30 days supply | Qty: 90 | Fill #4

## 2017-07-20 MED FILL — TRUEPLUS SYR 0.3ML 30GX5/16: 30G X 5/16" | 50 days supply | Qty: 100 | Fill #1

## 2017-08-23 MED FILL — LOSARTAN POTASSIUM 100 MG T: 100 | 30 days supply | Qty: 30 | Fill #1

## 2017-08-23 MED FILL — GLIMEPIRIDE 4 MG TABLET: 4 | 30 days supply | Qty: 30 | Fill #5

## 2017-08-23 MED FILL — GABAPENTIN 100 MG CAPSULE: 100 | 30 days supply | Qty: 90 | Fill #5

## 2017-08-23 MED FILL — SIMVASTATIN 40 MG TABLET: 40 | 30 days supply | Qty: 30 | Fill #2

## 2017-08-23 MED FILL — ?METFORMIN HCL 1,000 MG TAB: 1000 | 30 days supply | Qty: 60 | Fill #4

## 2017-08-23 MED FILL — FLUoxetine HCL 20 MG CAPS: 20 | 30 days supply | Qty: 30 | Fill #4

## 2017-08-23 MED FILL — $HUMULIN 70/30 KWIKPEN: (70-30) 100 | 37 days supply | Qty: 9 | Fill #0

## 2017-08-23 MED FILL — ?CARVEDILOL 25 MG TABLET: 25 | 30 days supply | Qty: 60 | Fill #3

## 2017-09-21 MED FILL — ?METFORMIN HCL 1,000 MG TAB: 1000 | 30 days supply | Qty: 60 | Fill #5

## 2017-09-21 MED FILL — ?GLIMEPIRIDE 4 MG TABLET: 4 | 30 days supply | Qty: 30 | Fill #6

## 2017-09-21 MED FILL — ?CARVEDILOL 25 MG TABLET: 25 | 30 days supply | Qty: 60 | Fill #4

## 2017-09-21 MED FILL — SIMVASTATIN 40 MG TABLET: 40 | 30 days supply | Qty: 30 | Fill #3

## 2017-09-21 MED FILL — FLUoxetine HCL 20 MG CAPS: 20 | 30 days supply | Qty: 30 | Fill #5

## 2017-09-21 MED FILL — GABAPENTIN 100 MG CAPSULE: 100 | 30 days supply | Qty: 90 | Fill #6

## 2017-09-21 MED FILL — LOSARTAN POTASSIUM 100 MG T: 100 | 30 days supply | Qty: 30 | Fill #2

## 2017-09-22 ENCOUNTER — Ambulatory Visit: Payer: Self-pay | Attending: Family Medicine

## 2017-09-23 MED FILL — $HUMULIN 70/30 KWIKPEN: (70-30) 100 | 37 days supply | Qty: 9 | Fill #1

## 2017-09-27 ENCOUNTER — Other Ambulatory Visit: Payer: Self-pay

## 2017-09-27 MED ORDER — PEN NEEDLES 3/16" 31G X 5 MM MISC
1.0000 | Freq: Two times a day (BID) | 12 refills | Status: DC
Start: 2017-09-27 — End: 2017-09-27

## 2017-09-27 MED ORDER — "PEN NEEDLES 3/16"" 31G X 5 MM MISC": 1.0000 | Freq: Two times a day (BID) | 12 refills | Status: DC

## 2017-09-27 MED FILL — TRUEPLUS PEN NDL 31G X 1/4": 31G X 6 MM | 50 days supply | Qty: 100 | Fill #0

## 2017-09-27 MED FILL — TRUEPLUS PEN NDL 31G X 1/4: 31G X 6 MM | 50 days supply | Qty: 100 | Fill #0

## 2017-10-15 ENCOUNTER — Encounter: Payer: Self-pay | Admitting: Family Medicine

## 2017-10-15 ENCOUNTER — Ambulatory Visit: Payer: Self-pay | Attending: Family Medicine | Admitting: Family Medicine

## 2017-10-15 VITALS — BP 169/72 | HR 64 | Temp 98.0°F | Ht 62.0 in | Wt 145.8 lb

## 2017-10-15 DIAGNOSIS — F331 Major depressive disorder, recurrent, moderate: Secondary | ICD-10-CM | POA: Insufficient documentation

## 2017-10-15 DIAGNOSIS — E1151 Type 2 diabetes mellitus with diabetic peripheral angiopathy without gangrene: Secondary | ICD-10-CM | POA: Insufficient documentation

## 2017-10-15 DIAGNOSIS — E1149 Type 2 diabetes mellitus with other diabetic neurological complication: Secondary | ICD-10-CM

## 2017-10-15 DIAGNOSIS — Z794 Long term (current) use of insulin: Secondary | ICD-10-CM | POA: Insufficient documentation

## 2017-10-15 DIAGNOSIS — E785 Hyperlipidemia, unspecified: Secondary | ICD-10-CM | POA: Insufficient documentation

## 2017-10-15 DIAGNOSIS — Z23 Encounter for immunization: Secondary | ICD-10-CM

## 2017-10-15 DIAGNOSIS — E118 Type 2 diabetes mellitus with unspecified complications: Secondary | ICD-10-CM

## 2017-10-15 DIAGNOSIS — Z90710 Acquired absence of both cervix and uterus: Secondary | ICD-10-CM | POA: Insufficient documentation

## 2017-10-15 DIAGNOSIS — Z1231 Encounter for screening mammogram for malignant neoplasm of breast: Secondary | ICD-10-CM

## 2017-10-15 DIAGNOSIS — Z9889 Other specified postprocedural states: Secondary | ICD-10-CM | POA: Insufficient documentation

## 2017-10-15 DIAGNOSIS — Z7982 Long term (current) use of aspirin: Secondary | ICD-10-CM | POA: Insufficient documentation

## 2017-10-15 DIAGNOSIS — Z79899 Other long term (current) drug therapy: Secondary | ICD-10-CM | POA: Insufficient documentation

## 2017-10-15 DIAGNOSIS — M79661 Pain in right lower leg: Secondary | ICD-10-CM | POA: Insufficient documentation

## 2017-10-15 DIAGNOSIS — Z791 Long term (current) use of non-steroidal anti-inflammatories (NSAID): Secondary | ICD-10-CM | POA: Insufficient documentation

## 2017-10-15 DIAGNOSIS — I739 Peripheral vascular disease, unspecified: Secondary | ICD-10-CM

## 2017-10-15 DIAGNOSIS — E11649 Type 2 diabetes mellitus with hypoglycemia without coma: Secondary | ICD-10-CM | POA: Insufficient documentation

## 2017-10-15 DIAGNOSIS — Z1239 Encounter for other screening for malignant neoplasm of breast: Secondary | ICD-10-CM

## 2017-10-15 DIAGNOSIS — I10 Essential (primary) hypertension: Secondary | ICD-10-CM | POA: Insufficient documentation

## 2017-10-15 LAB — GLUCOSE, POCT (MANUAL RESULT ENTRY): POC GLUCOSE: 275 mg/dL — AB (ref 70–99)

## 2017-10-15 LAB — POCT GLYCOSYLATED HEMOGLOBIN (HGB A1C): Hemoglobin A1C: 8

## 2017-10-15 MED ORDER — NIFEDIPINE ER OSMOTIC RELEASE 60 MG PO TB24: 60.0000 mg | ORAL_TABLET | Freq: Every day | ORAL | 3 refills | Status: DC

## 2017-10-15 MED ORDER — GABAPENTIN 300 MG PO CAPS: 300.0000 mg | ORAL_CAPSULE | Freq: Three times a day (TID) | ORAL | 1 refills | Status: DC

## 2017-10-15 MED ORDER — LOSARTAN POTASSIUM 100 MG PO TABS: ORAL_TABLET | ORAL | 1 refills | Status: DC

## 2017-10-15 MED ORDER — SIMVASTATIN 40 MG PO TABS: 40.0000 mg | ORAL_TABLET | Freq: Every day | ORAL | 1 refills | Status: DC

## 2017-10-15 MED ORDER — METFORMIN HCL 1000 MG PO TABS: 1000.0000 mg | ORAL_TABLET | Freq: Two times a day (BID) | ORAL | 1 refills | Status: DC

## 2017-10-15 MED ORDER — CARVEDILOL 25 MG PO TABS: 25.0000 mg | ORAL_TABLET | Freq: Two times a day (BID) | ORAL | 1 refills | Status: DC

## 2017-10-15 MED ORDER — FLUOXETINE HCL 20 MG PO TABS: 40.0000 mg | ORAL_TABLET | Freq: Every day | ORAL | 1 refills | Status: DC

## 2017-10-15 MED ORDER — GLIMEPIRIDE 4 MG PO TABS: 4.0000 mg | ORAL_TABLET | Freq: Every day | ORAL | 1 refills | Status: DC

## 2017-10-15 MED FILL — TRUE METRIX TEST STRIP: 30 days supply | Qty: 100 | Fill #1

## 2017-10-15 NOTE — Progress Notes (Signed)
Subjective:  Patient ID: Samantha [name], female    DOB: 12-25-1951  Age: 66 y.o. MRN: [id number]  CC: Hypertension and Diabetes   HPI Samantha [name]  is a 66 year old female with a history of hypertension, type 2 diabetes mellitus (A1c 8.0), hyperlipidemia, depression peripheral vascular disease who comes into the clinic for a follow-up visit.  She endorses compliance with her Humulin 70/30 but skips her evening dose when her sugars are in the 120s as she has experienced low sugars which she describes as a sugar of 90 three to four hours after administration of her evening dose of insulin. She had earlier informed me that her morning sugars were in the 125 range and evening sugars in the 200 range. She denies numbness in extremities, visual complaints.  Blood pressure is elevated and she endorses compliance with all her medications. She is nonadherent with a diabetic diet or low-sodium diet. Currently on a statin which she tolerates denies myalgias.  She complains of claudication pain in her right calf with walking but is absent at rest. She was last seen by vascular in 05/2015 and is status post directional atherectomy followed by drug-eluting balloon angioplasty to high-grade proximal and mid left SFA lesions. Follow-up lower extremity doppler and ABI from 06/2015 revealed normal ABI bilaterally, abnormal great toe - brachial indices indices bilaterally.  Past Medical History:  Diagnosis Date  . Acute pyelonephritis   . Bacteremia, escherichia coli 01/12/2015  . Chest pain   . Critical lower limb ischemia   . Hyperlipidemia   . Hypertension   . Left carotid bruit   . PAD (peripheral artery disease) (HCC)   . Type II diabetes mellitus (HCC)     Past Surgical History:  Procedure Laterality Date  . ABDOMINAL ANGIOGRAM  05/16/2015   Procedure: Abdominal Angiogram;  Surgeon: Runell GessJonathan J Berry, MD;  [facility];  Service: Cardiovascular;;  . ABDOMINAL  HYSTERECTOMY  ~ 2000  . PERIPHERAL VASCULAR CATHETERIZATION Bilateral 05/16/2015   Procedure: Lower Extremity Angiography;  Surgeon: Runell GessJonathan J Berry, MD; renal arteries widely patent, L-SFA 75%, L-pop 95%, L-peroneal 90%; R-SFA 40%, R-pop 50%, R-prox peroneal 90%; directional atherectomy L-SFA, p-pop, drug-eluting balloon angioplasty reducing the stenoses to 0, small linear dissection was not flow limiting      . PERIPHERAL VASCULAR CATHETERIZATION Left 05/16/2015   Procedure: Peripheral Vascular Atherectomy;  Surgeon: Runell GessJonathan J Berry, MD;  [facility];  Service: Cardiovascular;  Laterality: Left;  sfa    No Known Allergies   Outpatient Medications Prior to Visit  Medication Sig Dispense Refill  . aspirin 81 MG EC tablet Take 1 tablet (81 mg total) by mouth daily. 30 tablet 3  . Blood Glucose Monitoring Suppl (TRUE METRIX METER) DEVI 1 each by Does not apply route 3 (three) times daily before meals. 1 Device 0  . glucose blood (TRUE METRIX BLOOD GLUCOSE TEST) test strip Use 3 times daily before meals 100 each 12  . ibuprofen (ADVIL,MOTRIN) 200 MG tablet Take 400 mg by mouth every 6 (six) hours as needed (pain/fever). Reported on 06/10/2016    . Insulin Isophane & Regular Human (HUMULIN 70/30 KWIKPEN) (70-30) 100 UNIT/ML PEN Inject 12 Units into the skin 2 (two) times daily with a meal. 30 mL 3  . Insulin Pen Needle (PEN NEEDLES 3/16") 31G X 5 MM MISC 1 Stick by Does not apply route 2 (two) times daily at 8 am and 10 pm. 100 each 12  . Insulin Syringe-Needle U-100 (  BD INSULIN SYRINGE ULTRAFINE) 31G X 15/64" 0.5 ML MISC Use subcutaneously twice daily 60 each 12  . TRUEPLUS LANCETS 28G MISC 1 each by Does not apply route 3 (three) times daily before meals. 100 each 12  . carvedilol (COREG) 25 MG tablet Take 1 tablet (25 mg total) by mouth 2 (two) times daily with a meal. 180 tablet 1  . FLUoxetine (PROZAC) 20 MG tablet Take 2 tablets (40 mg total) by mouth daily. 180 tablet 1  .  gabapentin (NEURONTIN) 300 MG capsule Take 1 capsule (300 mg total) by mouth 3 (three) times daily. 90 capsule 1  . glimepiride (AMARYL) 4 MG tablet Take 1 tablet (4 mg total) by mouth daily with breakfast. 90 tablet 1  . insulin NPH-regular Human (NOVOLIN 70/30) (70-30) 100 UNIT/ML injection Inject 10 Units into the skin 2 (two) times daily with a meal. 30 mL 3  . losartan (COZAAR) 100 MG tablet TAKE 1 TABLET BY MOUTH DAILY. 90 tablet 1  . metFORMIN (GLUCOPHAGE) 1000 MG tablet Take 1 tablet (1,000 mg total) by mouth 2 (two) times daily with a meal. 180 tablet 1  . NIFEdipine (PROCARDIA XL/ADALAT-CC) 60 MG 24 hr tablet Take 1 tablet (60 mg total) by mouth daily. 30 tablet 3  . simvastatin (ZOCOR) 40 MG tablet Take 1 tablet (40 mg total) by mouth at bedtime. 90 tablet 0   Facility-Administered Medications Prior to Visit  Medication Dose Route Frequency Provider Last Rate Last Dose  . valsartan-hydrochlorothiazide (DIOVAN-HCT) 160-12.5 MG per tablet 1 tablet  1 tablet Oral Daily Jaclyn Shaggy, MD   1 tablet at 06/10/16 1110    ROS Review of Systems  Constitutional: Negative for activity change, appetite change and fatigue.  HENT: Negative for congestion, sinus pressure and sore throat.   Eyes: Negative for visual disturbance.  Respiratory: Negative for cough, chest tightness, shortness of breath and wheezing.   Cardiovascular: Negative for chest pain and palpitations.       Positive for claudication  Gastrointestinal: Negative for abdominal distention, abdominal pain and constipation.  Endocrine: Negative for polydipsia.  Genitourinary: Negative for dysuria and frequency.  Musculoskeletal: Negative for arthralgias and back pain.  Skin: Negative for rash.  Neurological: Negative for tremors, light-headedness and numbness.  Hematological: Does not bruise/bleed easily.  Psychiatric/Behavioral: Negative for agitation and behavioral problems.    Objective:  BP (!) 169/72   Pulse 64   Temp  98 F (36.7 C) (Oral)   Ht 5\' 2"  (1.575 m)   Wt 145 lb 12.8 oz (66.1 kg)   SpO2 98%   BMI 26.67 kg/m   BP/Weight 10/15/2017 06/28/2017 06/04/2017  Systolic BP 169 132 167  Diastolic BP 72 72 90  Wt. (Lbs) 145.8 150 147  BMI 26.67 27.44 25.23      Physical Exam  Constitutional: She is oriented to person, place, and time. She appears well-developed and well-nourished.  Cardiovascular: Normal rate and normal heart sounds.   No murmur heard. Pulses:      Dorsalis pedis pulses are 2+ on the right side, and 1+ on the left side.  Pulmonary/Chest: Effort normal and breath sounds normal. She has no wheezes. She has no rales. She exhibits no tenderness.  Abdominal: Soft. Bowel sounds are normal. She exhibits no distension and no mass. There is no tenderness.  Musculoskeletal: Normal range of motion.  Neurological: She is alert and oriented to person, place, and time.  Psychiatric: She has a normal mood and affect.  Lab Results  Component Value Date   HGBA1C 8.0 10/15/2017    Assessment & Plan:   1. Type 2 diabetes mellitus with complication, with long-term current use of insulin (HCC) Clinical with A1c of 8.0 She does complain of occasional hypoglycemia and so I will hold off on increasing her dose. Advised to avoid skipping evening dose of he cleaned 70/30 but rather take half his blood sugars are in the 100-120 range Diabetic diet, lifestyle modifications - POCT glucose (manual entry) - POCT glycosylated hemoglobin (Hb A1C) - metFORMIN (GLUCOPHAGE) 1000 MG tablet; Take 1 tablet (1,000 mg total) by mouth 2 (two) times daily with a meal.  Dispense: 180 tablet; Refill: 1 - glimepiride (AMARYL) 4 MG tablet; Take 1 tablet (4 mg total) by mouth daily with breakfast.  Dispense: 90 tablet; Refill: 1  2. Essential hypertension Uncontrolled Unable to assess compliance Advised to bring in all her medications at the next visit - losartan (COZAAR) 100 MG tablet; TAKE 1 TABLET BY MOUTH  DAILY.  Dispense: 90 tablet; Refill: 1 - carvedilol (COREG) 25 MG tablet; Take 1 tablet (25 mg total) by mouth 2 (two) times daily with a meal.  Dispense: 180 tablet; Refill: 1  3. PAD (peripheral artery disease) (HCC) Currently experiencing claudication pains 2+ dorsalis pedis in the right and 1+ in the left Status post revascularization procedure Will refer back to vascular surgery - simvastatin (ZOCOR) 40 MG tablet; Take 1 tablet (40 mg total) by mouth at bedtime.  Dispense: 90 tablet; Refill: 1 - Ambulatory referral to Vascular Surgery  4. Moderate episode of recurrent major depressive disorder (HCC) Controlled - FLUoxetine (PROZAC) 20 MG tablet; Take 2 tablets (40 mg total) by mouth daily.  Dispense: 180 tablet; Refill: 1  5. Type II diabetes mellitus with neurological manifestations (HCC) Stable - gabapentin (NEURONTIN) 300 MG capsule; Take 1 capsule (300 mg total) by mouth 3 (three) times daily.  Dispense: 90 capsule; Refill: 1  6. Need for influenza vaccination  7. Screening for breast cancer - MM Digital Screening; Future   Meds ordered this encounter  Medications  . losartan (COZAAR) 100 MG tablet    Sig: TAKE 1 TABLET BY MOUTH DAILY.    Dispense:  90 tablet    Refill:  1  . NIFEdipine (PROCARDIA XL/ADALAT-CC) 60 MG 24 hr tablet    Sig: Take 1 tablet (60 mg total) by mouth daily.    Dispense:  30 tablet    Refill:  3  . simvastatin (ZOCOR) 40 MG tablet    Sig: Take 1 tablet (40 mg total) by mouth at bedtime.    Dispense:  90 tablet    Refill:  1  . FLUoxetine (PROZAC) 20 MG tablet    Sig: Take 2 tablets (40 mg total) by mouth daily.    Dispense:  180 tablet    Refill:  1  . gabapentin (NEURONTIN) 300 MG capsule    Sig: Take 1 capsule (300 mg total) by mouth 3 (three) times daily.    Dispense:  90 capsule    Refill:  1  . carvedilol (COREG) 25 MG tablet    Sig: Take 1 tablet (25 mg total) by mouth 2 (two) times daily with a meal.    Dispense:  180 tablet     Refill:  1  . metFORMIN (GLUCOPHAGE) 1000 MG tablet    Sig: Take 1 tablet (1,000 mg total) by mouth 2 (two) times daily with a meal.    Dispense:  180  tablet    Refill:  1  . glimepiride (AMARYL) 4 MG tablet    Sig: Take 1 tablet (4 mg total) by mouth daily with breakfast.    Dispense:  90 tablet    Refill:  1    Follow-up: Return in about 6 weeks (around 11/26/2017) for follow up on Hypertension.   Jaclyn Shaggy MD

## 2017-10-26 MED FILL — LOSARTAN POTASSIUM 100 MG T: 100 | 30 days supply | Qty: 30 | Fill #0

## 2017-10-26 MED FILL — ?NIFEDIPINE ER 60 MG TABLET: 60 | 30 days supply | Qty: 30 | Fill #0

## 2017-10-26 MED FILL — GLIMEPIRIDE 4 MG TABLET: 4 | 30 days supply | Qty: 30 | Fill #0

## 2017-10-26 MED FILL — SIMVASTATIN 40 MG TABLET: 40 | 30 days supply | Qty: 30 | Fill #0

## 2017-10-26 MED FILL — ?FLUOXETINE HCL 20MG TABLET: 20 | 30 days supply | Qty: 60 | Fill #0

## 2017-10-26 MED FILL — CARVEDILOL 25 MG TABLET: 25 | 30 days supply | Qty: 60 | Fill #0

## 2017-10-26 MED FILL — GABAPENTIN 300 MG CAPSULE: 300 | 30 days supply | Qty: 90 | Fill #0

## 2017-10-26 MED FILL — ?METFORMIN HCL 1,000 MG TAB: 1000 | 30 days supply | Qty: 60 | Fill #0

## 2017-10-28 ENCOUNTER — Other Ambulatory Visit: Payer: Self-pay

## 2017-10-28 DIAGNOSIS — I70219 Atherosclerosis of native arteries of extremities with intermittent claudication, unspecified extremity: Secondary | ICD-10-CM

## 2017-12-06 ENCOUNTER — Encounter: Payer: Self-pay | Admitting: Surgery

## 2017-12-06 ENCOUNTER — Other Ambulatory Visit: Payer: Self-pay | Admitting: Family Medicine

## 2017-12-06 ENCOUNTER — Encounter (HOSPITAL_COMMUNITY): Payer: Self-pay

## 2017-12-06 DIAGNOSIS — E1149 Type 2 diabetes mellitus with other diabetic neurological complication: Secondary | ICD-10-CM

## 2017-12-06 MED FILL — ?GLIMEPIRIDE 4 MG TABLET: 4 | 30 days supply | Qty: 30 | Fill #1

## 2017-12-06 MED FILL — GABAPENTIN 300 MG CAPSULE: 300 | 30 days supply | Qty: 90 | Fill #1

## 2017-12-06 MED FILL — ?METFORMIN HCL 1,000 MG TAB: 1000 | 30 days supply | Qty: 60 | Fill #1

## 2017-12-06 MED FILL — $HUMULIN 70/30 KWIKPEN: (70-30) 100 | 37 days supply | Qty: 9 | Fill #2

## 2017-12-06 MED FILL — ?CARVEDILOL 25 MG TABLET: 25 | 30 days supply | Qty: 60 | Fill #1

## 2017-12-06 MED FILL — LOSARTAN POTASSIUM 100 MG T: 100 | 30 days supply | Qty: 30 | Fill #1

## 2017-12-06 MED FILL — SIMVASTATIN 40 MG TABLET: 40 | 30 days supply | Qty: 30 | Fill #1

## 2017-12-06 MED FILL — ?FLUOXETINE HCL 20MG TABLET: 20 | 30 days supply | Qty: 60 | Fill #1

## 2017-12-07 ENCOUNTER — Ambulatory Visit: Payer: Self-pay | Admitting: Family Medicine

## 2018-01-11 ENCOUNTER — Ambulatory Visit: Payer: Self-pay | Attending: Family Medicine | Admitting: Family Medicine

## 2018-01-11 ENCOUNTER — Encounter: Payer: Self-pay | Admitting: Family Medicine

## 2018-01-11 VITALS — BP 199/94 | HR 66 | Temp 97.9°F | Ht 62.0 in | Wt 144.0 lb

## 2018-01-11 DIAGNOSIS — I739 Peripheral vascular disease, unspecified: Secondary | ICD-10-CM | POA: Insufficient documentation

## 2018-01-11 DIAGNOSIS — E1149 Type 2 diabetes mellitus with other diabetic neurological complication: Secondary | ICD-10-CM

## 2018-01-11 DIAGNOSIS — E1142 Type 2 diabetes mellitus with diabetic polyneuropathy: Secondary | ICD-10-CM | POA: Insufficient documentation

## 2018-01-11 DIAGNOSIS — Z79899 Other long term (current) drug therapy: Secondary | ICD-10-CM | POA: Insufficient documentation

## 2018-01-11 DIAGNOSIS — F331 Major depressive disorder, recurrent, moderate: Secondary | ICD-10-CM | POA: Insufficient documentation

## 2018-01-11 DIAGNOSIS — E118 Type 2 diabetes mellitus with unspecified complications: Secondary | ICD-10-CM

## 2018-01-11 DIAGNOSIS — E1151 Type 2 diabetes mellitus with diabetic peripheral angiopathy without gangrene: Secondary | ICD-10-CM | POA: Insufficient documentation

## 2018-01-11 DIAGNOSIS — E785 Hyperlipidemia, unspecified: Secondary | ICD-10-CM | POA: Insufficient documentation

## 2018-01-11 DIAGNOSIS — Z7982 Long term (current) use of aspirin: Secondary | ICD-10-CM | POA: Insufficient documentation

## 2018-01-11 DIAGNOSIS — Z794 Long term (current) use of insulin: Secondary | ICD-10-CM

## 2018-01-11 DIAGNOSIS — I10 Essential (primary) hypertension: Secondary | ICD-10-CM | POA: Insufficient documentation

## 2018-01-11 LAB — POCT GLYCOSYLATED HEMOGLOBIN (HGB A1C): Hemoglobin A1C: 9.4

## 2018-01-11 LAB — GLUCOSE, POCT (MANUAL RESULT ENTRY): POC Glucose: 293 mg/dl — AB (ref 70–99)

## 2018-01-11 MED ORDER — CARVEDILOL 25 MG PO TABS: 25.0000 mg | ORAL_TABLET | Freq: Two times a day (BID) | ORAL | 1 refills | Status: DC

## 2018-01-11 MED ORDER — FLUOXETINE HCL 20 MG PO TABS: 40.0000 mg | ORAL_TABLET | Freq: Every day | ORAL | 1 refills | Status: DC

## 2018-01-11 MED ORDER — GLIMEPIRIDE 4 MG PO TABS: 4.0000 mg | ORAL_TABLET | Freq: Every day | ORAL | 1 refills | Status: DC

## 2018-01-11 MED ORDER — GABAPENTIN 300 MG PO CAPS: 300.0000 mg | ORAL_CAPSULE | Freq: Three times a day (TID) | ORAL | 1 refills | Status: DC

## 2018-01-11 MED ORDER — AMLODIPINE BESYLATE 10 MG PO TABS: 10.0000 mg | ORAL_TABLET | Freq: Every day | ORAL | 1 refills | Status: DC

## 2018-01-11 MED ORDER — INSULIN ISOPHANE & REGULAR (HUMAN 70-30)100 UNIT/ML KWIKPEN: 17.0000 [IU] | PEN_INJECTOR | Freq: Two times a day (BID) | SUBCUTANEOUS | 3 refills | Status: DC

## 2018-01-11 MED ORDER — ATORVASTATIN CALCIUM 40 MG PO TABS
40.0000 mg | ORAL_TABLET | Freq: Every day | ORAL | 1 refills | Status: DC
Start: 2018-01-11 — End: 2018-10-10

## 2018-01-11 MED ORDER — METFORMIN HCL 1000 MG PO TABS: 1000.0000 mg | ORAL_TABLET | Freq: Two times a day (BID) | ORAL | 1 refills | Status: DC

## 2018-01-11 MED ORDER — LOSARTAN POTASSIUM 100 MG PO TABS: ORAL_TABLET | ORAL | 1 refills | Status: DC

## 2018-01-11 MED FILL — AMLODIPINE BESYLATE 10 MG T: 10 | 30 days supply | Qty: 30 | Fill #0

## 2018-01-11 MED FILL — ?FLUOXETINE HCL 20MG TABLET: 20 | 30 days supply | Qty: 60 | Fill #0

## 2018-01-11 MED FILL — ?ATORVASTATIN 40MG TABLET: 40 | 30 days supply | Qty: 30 | Fill #0

## 2018-01-11 MED FILL — ?METFORMIN HCL 1,000 MG TAB: 1000 | 30 days supply | Qty: 60 | Fill #0

## 2018-01-11 MED FILL — ?GLIMEPIRIDE 4 MG TABLET: 4 | 30 days supply | Qty: 30 | Fill #0

## 2018-01-11 MED FILL — LOSARTAN POTASSIUM 100 MG T: 100 | 30 days supply | Qty: 30 | Fill #0

## 2018-01-11 MED FILL — GABAPENTIN 300 MG CAPSULE: 300 | 30 days supply | Qty: 90 | Fill #0

## 2018-01-11 MED FILL — $HUMULIN 70/30 KWIKPEN: (70-30) 100 | 26 days supply | Qty: 9 | Fill #0

## 2018-01-11 MED FILL — ?CARVEDILOL 25 MG TABLET: 25 | 30 days supply | Qty: 60 | Fill #0

## 2018-01-11 NOTE — Patient Instructions (Signed)
Diabetes Mellitus and Nutrition When you have diabetes (diabetes mellitus), it is very important to have healthy eating habits because your blood sugar (glucose) levels are greatly affected by what you eat and drink. Eating healthy foods in the appropriate amounts, at about the same times every day, can help you:  Control your blood glucose.  Lower your risk of heart disease.  Improve your blood pressure.  Reach or maintain a healthy weight.  Every person with diabetes is different, and each person has different needs for a meal plan. Your health care provider may recommend that you work with a diet and nutrition specialist (dietitian) to make a meal plan that is best for you. Your meal plan may vary depending on factors such as:  The calories you need.  The medicines you take.  Your weight.  Your blood glucose, blood pressure, and cholesterol levels.  Your activity level.  Other health conditions you have, such as heart or kidney disease.  How do carbohydrates affect me? Carbohydrates affect your blood glucose level more than any other type of food. Eating carbohydrates naturally increases the amount of glucose in your blood. Carbohydrate counting is a method for keeping track of how many carbohydrates you eat. Counting carbohydrates is important to keep your blood glucose at a healthy level, especially if you use insulin or take certain oral diabetes medicines. It is important to know how many carbohydrates you can safely have in each meal. This is different for every person. Your dietitian can help you calculate how many carbohydrates you should have at each meal and for snack. Foods that contain carbohydrates include:  Bread, cereal, rice, pasta, and crackers.  Potatoes and corn.  Peas, beans, and lentils.  Milk and yogurt.  Fruit and juice.  Desserts, such as cakes, cookies, ice cream, and candy.  How does alcohol affect me? Alcohol can cause a sudden decrease in blood  glucose (hypoglycemia), especially if you use insulin or take certain oral diabetes medicines. Hypoglycemia can be a life-threatening condition. Symptoms of hypoglycemia (sleepiness, dizziness, and confusion) are similar to symptoms of having too much alcohol. If your health care provider says that alcohol is safe for you, follow these guidelines:  Limit alcohol intake to no more than 1 drink per day for nonpregnant women and 2 drinks per day for men. One drink equals 12 oz of beer, 5 oz of wine, or 1 oz of hard liquor.  Do not drink on an empty stomach.  Keep yourself hydrated with water, diet soda, or unsweetened iced tea.  Keep in mind that regular soda, juice, and other mixers may contain a lot of sugar and must be counted as carbohydrates.  What are tips for following this plan? Reading food labels  Start by checking the serving size on the label. The amount of calories, carbohydrates, fats, and other nutrients listed on the label are based on one serving of the food. Many foods contain more than one serving per package.  Check the total grams (g) of carbohydrates in one serving. You can calculate the number of servings of carbohydrates in one serving by dividing the total carbohydrates by 15. For example, if a food has 30 g of total carbohydrates, it would be equal to 2 servings of carbohydrates.  Check the number of grams (g) of saturated and trans fats in one serving. Choose foods that have low or no amount of these fats.  Check the number of milligrams (mg) of sodium in one serving. Most people   should limit total sodium intake to less than 2,300 mg per day.  Always check the nutrition information of foods labeled as "low-fat" or "nonfat". These foods may be higher in added sugar or refined carbohydrates and should be avoided.  Talk to your dietitian to identify your daily goals for nutrients listed on the label. Shopping  Avoid buying canned, premade, or processed foods. These  foods tend to be high in fat, sodium, and added sugar.  Shop around the outside edge of the grocery store. This includes fresh fruits and vegetables, bulk grains, fresh meats, and fresh dairy. Cooking  Use low-heat cooking methods, such as baking, instead of high-heat cooking methods like deep frying.  Cook using healthy oils, such as olive, canola, or sunflower oil.  Avoid cooking with butter, cream, or high-fat meats. Meal planning  Eat meals and snacks regularly, preferably at the same times every day. Avoid going long periods of time without eating.  Eat foods high in fiber, such as fresh fruits, vegetables, beans, and whole grains. Talk to your dietitian about how many servings of carbohydrates you can eat at each meal.  Eat 4-6 ounces of lean protein each day, such as lean meat, chicken, fish, eggs, or tofu. 1 ounce is equal to 1 ounce of meat, chicken, or fish, 1 egg, or 1/4 cup of tofu.  Eat some foods each day that contain healthy fats, such as avocado, nuts, seeds, and fish. Lifestyle   Check your blood glucose regularly.  Exercise at least 30 minutes 5 or more days each week, or as told by your health care provider.  Take medicines as told by your health care provider.  Do not use any products that contain nicotine or tobacco, such as cigarettes and e-cigarettes. If you need help quitting, ask your health care provider.  Work with a counselor or diabetes educator to identify strategies to manage stress and any emotional and social challenges. What are some questions to ask my health care provider?  Do I need to meet with a diabetes educator?  Do I need to meet with a dietitian?  What number can I call if I have questions?  When are the best times to check my blood glucose? Where to find more information:  American Diabetes Association: diabetes.org/food-and-fitness/food  Academy of Nutrition and Dietetics:  www.eatright.org/resources/health/diseases-and-conditions/diabetes  National Institute of Diabetes and Digestive and Kidney Diseases (NIH): www.niddk.nih.gov/health-information/diabetes/overview/diet-eating-physical-activity Summary  A healthy meal plan will help you control your blood glucose and maintain a healthy lifestyle.  Working with a diet and nutrition specialist (dietitian) can help you make a meal plan that is best for you.  Keep in mind that carbohydrates and alcohol have immediate effects on your blood glucose levels. It is important to count carbohydrates and to use alcohol carefully. This information is not intended to replace advice given to you by your health care provider. Make sure you discuss any questions you have with your health care provider. Document Released: 09/10/2005 Document Revised: 01/18/2017 Document Reviewed: 01/18/2017 Elsevier Interactive Patient Education  2018 [employer]  

## 2018-01-11 NOTE — Progress Notes (Signed)
Subjective:  Patient ID: Samantha [name], female    DOB: Apr 11, 1951  Age: 67 y.o. MRN: [id number]  CC: Hypertension and Diabetes   HPI Samantha [name] is a 67 year old female with a history of hypertension, type 2 diabetes mellitus (A1c 9.4), hyperlipidemia, depression, peripheral vascular disease who comes into the clinic for a follow-up visit.  Her A1c is 9.4 which has trended up from 8.0 previously and she attributes this to eating lots of sweets over the [facility] but she has been compliant with her insulin. Her lowest fasting sugars have been in the 170s  And random sugars in the 317 range. Her neuropathy is controlled on Gabapentin.  Her blood pressure is significantly elevated but she attributes this to white coat hypertension as she endorses compliance with her medications. Review of her med list indicates she has not been taking Nifedipine. Tolerating her statin and denies myalgias.  Doing well on Prozac and denies suicidal or homicidal ideations.  Past Medical History:  Diagnosis Date  . Acute pyelonephritis   . Bacteremia, escherichia coli 01/12/2015  . Chest pain   . Critical lower limb ischemia   . Hyperlipidemia   . Hypertension   . Left carotid bruit   . PAD (peripheral artery disease) (HCC)   . Type II diabetes mellitus (HCC)     Past Surgical History:  Procedure Laterality Date  . ABDOMINAL ANGIOGRAM  05/16/2015   Procedure: Abdominal Angiogram;  Surgeon: Runell Gess, MD;  Location: [facility] INVASIVE CV LAB;  Service: Cardiovascular;;  . ABDOMINAL HYSTERECTOMY  ~ 2000  . PERIPHERAL VASCULAR CATHETERIZATION Bilateral 05/16/2015   Procedure: Lower Extremity Angiography;  Surgeon: Runell Gess, MD; renal arteries widely patent, L-SFA 75%, L-pop 95%, L-peroneal 90%; R-SFA 40%, R-pop 50%, R-prox peroneal 90%; directional atherectomy L-SFA, p-pop, drug-eluting balloon angioplasty reducing the stenoses to 0, small linear dissection was not flow limiting        . PERIPHERAL VASCULAR CATHETERIZATION Left 05/16/2015   Procedure: Peripheral Vascular Atherectomy;  Surgeon: Runell Gess, MD;  Location: [facility] INVASIVE CV LAB;  Service: Cardiovascular;  Laterality: Left;  sfa    No Known Allergies    Outpatient Medications Prior to Visit  Medication Sig Dispense Refill  . Blood Glucose Monitoring Suppl (TRUE METRIX METER) DEVI 1 each by Does not apply route 3 (three) times daily before meals. 1 Device 0  . glucose blood (TRUE METRIX BLOOD GLUCOSE TEST) test strip Use 3 times daily before meals 100 each 12  . Insulin Pen Needle (PEN NEEDLES 3/16") 31G X 5 MM MISC 1 Stick by Does not apply route 2 (two) times daily at 8 am and 10 pm. 100 each 12  . Insulin Syringe-Needle U-100 (BD INSULIN SYRINGE ULTRAFINE) 31G X 15/64" 0.5 ML MISC Use subcutaneously twice daily 60 each 12  . TRUEPLUS LANCETS 28G MISC 1 each by Does not apply route 3 (three) times daily before meals. 100 each 12  . carvedilol (COREG) 25 MG tablet Take 1 tablet (25 mg total) by mouth 2 (two) times daily with a meal. 180 tablet 1  . FLUoxetine (PROZAC) 20 MG tablet Take 2 tablets (40 mg total) by mouth daily. 180 tablet 1  . glimepiride (AMARYL) 4 MG tablet Take 1 tablet (4 mg total) by mouth daily with breakfast. 90 tablet 1  . Insulin Isophane & Regular Human (HUMULIN 70/30 KWIKPEN) (70-30) 100 UNIT/ML PEN Inject 12 Units into the skin 2 (two) times daily with a meal. 30 mL 3  .  losartan (COZAAR) 100 MG tablet TAKE 1 TABLET BY MOUTH DAILY. 90 tablet 1  . metFORMIN (GLUCOPHAGE) 1000 MG tablet Take 1 tablet (1,000 mg total) by mouth 2 (two) times daily with a meal. 180 tablet 1  . simvastatin (ZOCOR) 40 MG tablet Take 1 tablet (40 mg total) by mouth at bedtime. 90 tablet 1  . aspirin 81 MG EC tablet Take 1 tablet (81 mg total) by mouth daily. (Patient not taking: Reported on 01/11/2018) 30 tablet 3  . ibuprofen (ADVIL,MOTRIN) 200 MG tablet Take 400 mg by mouth every 6 (six) hours as needed  (pain/fever). Reported on 06/10/2016    . gabapentin (NEURONTIN) 300 MG capsule Take 1 capsule (300 mg total) by mouth 3 (three) times daily. (Patient not taking: Reported on 01/11/2018) 90 capsule 1  . NIFEdipine (PROCARDIA XL/ADALAT-CC) 60 MG 24 hr tablet Take 1 tablet (60 mg total) by mouth daily. (Patient not taking: Reported on 01/11/2018) 30 tablet 3   Facility-Administered Medications Prior to Visit  Medication Dose Route Frequency Provider Last Rate Last Dose  . valsartan-hydrochlorothiazide (DIOVAN-HCT) 160-12.5 MG per tablet 1 tablet  1 tablet Oral Daily Jaclyn Shaggy, MD   1 tablet at 06/10/16 1110    ROS Review of Systems  Constitutional: Negative for activity change, appetite change and fatigue.  HENT: Negative for congestion, sinus pressure and sore throat.   Eyes: Negative for visual disturbance.  Respiratory: Negative for cough, chest tightness, shortness of breath and wheezing.   Cardiovascular: Negative for chest pain and palpitations.  Gastrointestinal: Negative for abdominal distention, abdominal pain and constipation.  Endocrine: Negative for polydipsia.  Genitourinary: Negative for dysuria and frequency.  Musculoskeletal: Negative for arthralgias and back pain.  Skin: Negative for rash.  Neurological: Negative for tremors, light-headedness and numbness.  Hematological: Does not bruise/bleed easily.  Psychiatric/Behavioral: Negative for agitation and behavioral problems.    Objective:  BP (!) 199/94   Pulse 66   Temp 97.9 F (36.6 C) (Oral)   Ht 5\' 2"  (1.575 m)   Wt 144 lb (65.3 kg)   SpO2 99%   BMI 26.34 kg/m   BP/Weight 01/11/2018 10/15/2017 06/28/2017  Systolic BP 199 169 132  Diastolic BP 94 72 72  Wt. (Lbs) 144 145.8 150  BMI 26.34 26.67 27.44      Physical Exam  Constitutional: She is oriented to person, place, and time. She appears well-developed and well-nourished.  Cardiovascular: Normal rate and normal heart sounds.  No murmur heard. R   Dorsalis pedis 2+ L Dorsalis pedis 1+  Pulmonary/Chest: Effort normal and breath sounds normal. She has no wheezes. She has no rales. She exhibits no tenderness.  Abdominal: Soft. Bowel sounds are normal. She exhibits no distension and no mass. There is no tenderness.  Musculoskeletal: Normal range of motion.  Neurological: She is alert and oriented to person, place, and time.  Skin: Skin is warm and dry.  Psychiatric: She has a normal mood and affect.    CMP Latest Ref Rng & Units 03/24/2017 12/23/2016 06/10/2016  Glucose 65 - 99 mg/dL 161(W) 960(A) 540(J)  BUN 8 - 27 mg/dL 23 15 17   Creatinine 0.57 - 1.00 mg/dL 8.11(B) 1.47(W) 2.95(A)  Sodium 134 - 144 mmol/L 140 137 137  Potassium 3.5 - 5.2 mmol/L 4.6 4.6 4.6  Chloride 96 - 106 mmol/L 101 103 102  CO2 18 - 29 mmol/L 22 21 18(L)  Calcium 8.7 - 10.3 mg/dL 9.5 9.2 9.5  Total Protein 6.0 - 8.5 g/dL 6.9 6.4  7.1  Total Bilirubin 0.0 - 1.2 mg/dL 0.3 0.4 0.4  Alkaline Phos 39 - 117 IU/L 91 88 90  AST 0 - 40 IU/L 21 24 19   ALT 0 - 32 IU/L 21 23 18     Lipid Panel     Component Value Date/Time   CHOL 133 03/24/2017 0943   TRIG 126 03/24/2017 0943   HDL 49 03/24/2017 0943   CHOLHDL 2.7 03/24/2017 0943   CHOLHDL 3.2 02/12/2016 1707   VLDL 27 02/12/2016 1707   LDLCALC 59 03/24/2017 0943     Lab Results  Component Value Date   HGBA1C 9.4 01/11/2018     Assessment & Plan:   1. Type 2 diabetes mellitus with complication, with long-term current use of insulin (HCC) Uncontrolled with A1c of 9.4 due to nondiscretionary eating over the Holidays Increase doe of Novolog 70/30 Counseled on diabetic diet Keep blood sugar logs with fasting goals of 80-120 mg/dl, random of less than 161 and in the event of sugars less than 60 mg/dl or greater than 096 mg/dl please notify the clinic ASAP. It is recommended that you undergo annual eye exams and annual foot exams. Pneumovax is recommended every 5 years before the age of 81 and once for a  lifetime at or after the age of 45. - POCT glucose (manual entry) - POCT glycosylated hemoglobin (Hb A1C) - glimepiride (AMARYL) 4 MG tablet; Take 1 tablet (4 mg total) by mouth daily with breakfast.  Dispense: 90 tablet; Refill: 1 - metFORMIN (GLUCOPHAGE) 1000 MG tablet; Take 1 tablet (1,000 mg total) by mouth 2 (two) times daily with a meal.  Dispense: 180 tablet; Refill: 1  2. Essential hypertension Uncontrolled She attributed this to white coat hypertension I have switched from Nifedipine to Amlodipine - she has not been taking Nifedipine either Advised to keep a home blood pressure log to be reviewed at next visit. Counseled on blood pressure goal of less than 130/80, low-sodium, DASH diet, medication compliance, 150 minutes of moderate intensity exercise per week. Discussed medication compliance, adverse effects. - carvedilol (COREG) 25 MG tablet; Take 1 tablet (25 mg total) by mouth 2 (two) times daily with a meal.  Dispense: 180 tablet; Refill: 1 - losartan (COZAAR) 100 MG tablet; TAKE 1 TABLET BY MOUTH DAILY.  Dispense: 90 tablet; Refill: 1  3. Moderate episode of recurrent major depressive disorder (HCC) controlled - FLUoxetine (PROZAC) 20 MG tablet; Take 2 tablets (40 mg total) by mouth daily.  Dispense: 180 tablet; Refill: 1  4. Type II diabetes mellitus with neurological manifestations (HCC) Stable - gabapentin (NEURONTIN) 300 MG capsule; Take 1 capsule (300 mg total) by mouth 3 (three) times daily.  Dispense: 90 capsule; Refill: 1   Meds ordered this encounter  Medications  . Insulin Isophane & Regular Human (HUMULIN 70/30 KWIKPEN) (70-30) 100 UNIT/ML PEN    Sig: Inject 17 Units into the skin 2 (two) times daily with a meal.    Dispense:  30 mL    Refill:  3  . carvedilol (COREG) 25 MG tablet    Sig: Take 1 tablet (25 mg total) by mouth 2 (two) times daily with a meal.    Dispense:  180 tablet    Refill:  1  . FLUoxetine (PROZAC) 20 MG tablet    Sig: Take 2 tablets  (40 mg total) by mouth daily.    Dispense:  180 tablet    Refill:  1  . gabapentin (NEURONTIN) 300 MG capsule  Sig: Take 1 capsule (300 mg total) by mouth 3 (three) times daily.    Dispense:  90 capsule    Refill:  1  . glimepiride (AMARYL) 4 MG tablet    Sig: Take 1 tablet (4 mg total) by mouth daily with breakfast.    Dispense:  90 tablet    Refill:  1  . losartan (COZAAR) 100 MG tablet    Sig: TAKE 1 TABLET BY MOUTH DAILY.    Dispense:  90 tablet    Refill:  1  . metFORMIN (GLUCOPHAGE) 1000 MG tablet    Sig: Take 1 tablet (1,000 mg total) by mouth 2 (two) times daily with a meal.    Dispense:  180 tablet    Refill:  1  . amLODipine (NORVASC) 10 MG tablet    Sig: Take 1 tablet (10 mg total) by mouth daily.    Dispense:  90 tablet    Refill:  1    Discontinue Nifedipine  . atorvastatin (LIPITOR) 40 MG tablet    Sig: Take 1 tablet (40 mg total) by mouth daily.    Dispense:  90 tablet    Refill:  1    Discontinue Simvastatin    Follow-up: Return in about 1 month (around 02/11/2018) for follow up of Hypertension and Diabetes.   Jaclyn ShaggyEnobong Amao MD

## 2018-02-23 ENCOUNTER — Ambulatory Visit: Payer: Self-pay | Attending: Family Medicine | Admitting: Family Medicine

## 2018-02-23 ENCOUNTER — Encounter: Payer: Self-pay | Admitting: Family Medicine

## 2018-02-23 VITALS — BP 176/78 | HR 59 | Temp 98.0°F | Ht 62.0 in | Wt 144.4 lb

## 2018-02-23 DIAGNOSIS — E1165 Type 2 diabetes mellitus with hyperglycemia: Secondary | ICD-10-CM | POA: Insufficient documentation

## 2018-02-23 DIAGNOSIS — Z1159 Encounter for screening for other viral diseases: Secondary | ICD-10-CM

## 2018-02-23 DIAGNOSIS — E1151 Type 2 diabetes mellitus with diabetic peripheral angiopathy without gangrene: Secondary | ICD-10-CM | POA: Insufficient documentation

## 2018-02-23 DIAGNOSIS — Z7982 Long term (current) use of aspirin: Secondary | ICD-10-CM | POA: Insufficient documentation

## 2018-02-23 DIAGNOSIS — Z0189 Encounter for other specified special examinations: Secondary | ICD-10-CM | POA: Insufficient documentation

## 2018-02-23 DIAGNOSIS — Z79899 Other long term (current) drug therapy: Secondary | ICD-10-CM | POA: Insufficient documentation

## 2018-02-23 DIAGNOSIS — I10 Essential (primary) hypertension: Secondary | ICD-10-CM | POA: Insufficient documentation

## 2018-02-23 DIAGNOSIS — E118 Type 2 diabetes mellitus with unspecified complications: Secondary | ICD-10-CM

## 2018-02-23 DIAGNOSIS — Z9119 Patient's noncompliance with other medical treatment and regimen: Secondary | ICD-10-CM | POA: Insufficient documentation

## 2018-02-23 DIAGNOSIS — F329 Major depressive disorder, single episode, unspecified: Secondary | ICD-10-CM | POA: Insufficient documentation

## 2018-02-23 DIAGNOSIS — Z0000 Encounter for general adult medical examination without abnormal findings: Secondary | ICD-10-CM

## 2018-02-23 DIAGNOSIS — Z794 Long term (current) use of insulin: Secondary | ICD-10-CM | POA: Insufficient documentation

## 2018-02-23 DIAGNOSIS — E785 Hyperlipidemia, unspecified: Secondary | ICD-10-CM | POA: Insufficient documentation

## 2018-02-23 LAB — GLUCOSE, POCT (MANUAL RESULT ENTRY): POC GLUCOSE: 270 mg/dL — AB (ref 70–99)

## 2018-02-23 MED ORDER — ISOSORBIDE MONONITRATE ER 60 MG PO TB24: 60.0000 mg | ORAL_TABLET | Freq: Every day | ORAL | 3 refills | Status: DC

## 2018-02-23 NOTE — Progress Notes (Signed)
Subjective:  Patient ID: Samantha [name], female    DOB: 05-01-1951  Age: 67 y.o. MRN: [id number]  CC: Hypertension and Diabetes   HPI Samantha [name] is a 67 year old female with a history of hypertension, type 2 diabetes mellitus (A1c 9.4), hyperlipidemia, depression, peripheral vascular disease who comes into the clinic for a follow-up visit. Her antihypertensive regimen had been changed at her last office visit however her blood pressure remains elevated; I have reviewed her medication bottles personally. She brings in blood pressure log from home which reveals systolic blood pressures have been in the 150s and above. She has not been compliant with a low-sodium diabetic diet.  She is accompanied by her daughter today and has no acute concerns.  Past Medical History:  Diagnosis Date  . Acute pyelonephritis   . Bacteremia, escherichia coli 01/12/2015  . Chest pain   . Critical lower limb ischemia   . Hyperlipidemia   . Hypertension   . Left carotid bruit   . PAD (peripheral artery disease) ([place])   . Type II diabetes mellitus ([place])     Past Surgical History:  Procedure Laterality Date  . ABDOMINAL ANGIOGRAM  05/16/2015   Procedure: Abdominal Angiogram;  Surgeon: Lorretta Harp, MD;  Location: [facility];  Service: Cardiovascular;;  . ABDOMINAL HYSTERECTOMY  ~ 2000  . PERIPHERAL VASCULAR CATHETERIZATION Bilateral 05/16/2015   Procedure: Lower Extremity Angiography;  Surgeon: Lorretta Harp, MD; renal arteries widely patent, L-SFA 75%, L-pop 95%, L-peroneal 90%; R-SFA 40%, R-pop 50%, R-prox peroneal 90%; directional atherectomy L-SFA, p-pop, drug-eluting balloon angioplasty reducing the stenoses to 0, small linear dissection was not flow limiting      . PERIPHERAL VASCULAR CATHETERIZATION Left 05/16/2015   Procedure: Peripheral Vascular Atherectomy;  Surgeon: Lorretta Harp, MD;  Location: [place] [facility];  Service: Cardiovascular;  Laterality: Left;  sfa     No Known Allergies   Outpatient Medications Prior to Visit  Medication Sig Dispense Refill  . amLODipine (NORVASC) 10 MG tablet Take 1 tablet (10 mg total) by mouth daily. 90 tablet 1  . atorvastatin (LIPITOR) 40 MG tablet Take 1 tablet (40 mg total) by mouth daily. 90 tablet 1  . Blood Glucose Monitoring Suppl (TRUE METRIX METER) DEVI 1 each by Does not apply route 3 (three) times daily before meals. 1 Device 0  . carvedilol (COREG) 25 MG tablet Take 1 tablet (25 mg total) by mouth 2 (two) times daily with a meal. 180 tablet 1  . FLUoxetine (PROZAC) 20 MG tablet Take 2 tablets (40 mg total) by mouth daily. 180 tablet 1  . gabapentin (NEURONTIN) 300 MG capsule Take 1 capsule (300 mg total) by mouth 3 (three) times daily. 90 capsule 1  . glimepiride (AMARYL) 4 MG tablet Take 1 tablet (4 mg total) by mouth daily with breakfast. 90 tablet 1  . glucose blood (TRUE METRIX BLOOD GLUCOSE TEST) test strip Use 3 times daily before meals 100 each 12  . ibuprofen (ADVIL,MOTRIN) 200 MG tablet Take 400 mg by mouth every 6 (six) hours as needed (pain/fever). Reported on 06/10/2016    . Insulin Isophane & Regular Human (HUMULIN 70/30 KWIKPEN) (70-30) 100 UNIT/ML PEN Inject 17 Units into the skin 2 (two) times daily with a meal. 30 mL 3  . Insulin Pen Needle (PEN NEEDLES 3/16") 31G X 5 MM MISC 1 Stick by Does not apply route 2 (two) times daily at 8 am and 10 pm. 100 each 12  . Insulin  Syringe-Needle U-100 (BD INSULIN SYRINGE ULTRAFINE) 31G X 15/64" 0.5 ML MISC Use subcutaneously twice daily 60 each 12  . losartan (COZAAR) 100 MG tablet TAKE 1 TABLET BY MOUTH DAILY. 90 tablet 1  . metFORMIN (GLUCOPHAGE) 1000 MG tablet Take 1 tablet (1,000 mg total) by mouth 2 (two) times daily with a meal. 180 tablet 1  . TRUEPLUS LANCETS 28G MISC 1 each by Does not apply route 3 (three) times daily before meals. 100 each 12  . aspirin 81 MG EC tablet Take 1 tablet (81 mg total) by mouth daily. (Patient not taking: Reported  on 01/11/2018) 30 tablet 3   Facility-Administered Medications Prior to Visit  Medication Dose Route Frequency Provider Last Rate Last Dose  . valsartan-hydrochlorothiazide (DIOVAN-HCT) 160-12.5 MG per tablet 1 tablet  1 tablet Oral Daily Charlott Rakes, MD   1 tablet at 06/10/16 1110    ROS Review of Systems  Constitutional: Negative for activity change, appetite change and fatigue.  HENT: Negative for congestion, sinus pressure and sore throat.   Eyes: Negative for visual disturbance.  Respiratory: Negative for cough, chest tightness, shortness of breath and wheezing.   Cardiovascular: Negative for chest pain and palpitations.  Gastrointestinal: Negative for abdominal distention, abdominal pain and constipation.  Endocrine: Negative for polydipsia.  Genitourinary: Negative for dysuria and frequency.  Musculoskeletal: Negative for arthralgias and back pain.  Skin: Negative for rash.  Neurological: Negative for tremors, light-headedness and numbness.  Hematological: Does not bruise/bleed easily.  Psychiatric/Behavioral: Negative for agitation and behavioral problems.    Objective:  BP (!) 176/78   Pulse (!) 59   Temp 98 F (36.7 C) (Oral)   Ht '5\' 2"'  (1.575 m)   Wt 144 lb 6.4 oz (65.5 kg)   SpO2 100%   BMI 26.41 kg/m   BP/Weight 02/23/2018 01/11/2018 65/78/4696  Systolic BP 295 284 132  Diastolic BP 78 94 72  Wt. (Lbs) 144.4 144 145.8  BMI 26.41 26.34 26.67      Physical Exam  Constitutional: She is oriented to person, place, and time. She appears well-developed and well-nourished.  Cardiovascular: Normal rate, normal heart sounds and intact distal pulses.  No murmur heard. Pulmonary/Chest: Effort normal and breath sounds normal. She has no wheezes. She has no rales. She exhibits no tenderness.  Abdominal: Soft. Bowel sounds are normal. She exhibits no distension and no mass. There is no tenderness.  Musculoskeletal: Normal range of motion.  Neurological: She is alert  and oriented to person, place, and time.  Skin: Skin is warm and dry.  Psychiatric: She has a normal mood and affect.    Lab Results  Component Value Date   HGBA1C 9.4 01/11/2018    Assessment & Plan:   1. Type 2 diabetes mellitus with complication, with long-term current use of insulin (HCC) Uncontrolled with A1c of 9.4 Insulin regimen was adjusted at her last visit 1 month ago so no changes be made today - POCT glucose (manual entry) - Microalbumin/Creatinine Ratio, Urine  2. Essential hypertension Controlled Isosorbide added to regimen Counseled on blood pressure goal of less than 130/80, low-sodium, DASH diet, medication compliance, 150 minutes of moderate intensity exercise per week. Discussed medication compliance, adverse effects. - isosorbide mononitrate (IMDUR) 60 MG 24 hr tablet; Take 1 tablet (60 mg total) by mouth daily.  Dispense: 30 tablet; Refill: 3 - [id number]+EGFR  3. Healthcare maintenance Declines colonoscopy Referred for mammogram in 09/2017; she has been provided with information to schedule this  4. Need for hepatitis  C screening test - Hepatitis c antibody (reflex)   Meds ordered this encounter  Medications  . isosorbide mononitrate (IMDUR) 60 MG 24 hr tablet    Sig: Take 1 tablet (60 mg total) by mouth daily.    Dispense:  30 tablet    Refill:  3    Follow-up: Return in about 3 months (around 05/23/2018) for Follow-up on diabetes mellitus.   Charlott Rakes MD

## 2018-02-23 NOTE — Patient Instructions (Signed)

## 2018-02-24 LAB — CMP14+EGFR
ALBUMIN: 4.1 g/dL (ref 3.6–4.8)
ALT: 17 IU/L (ref 0–32)
AST: 15 IU/L (ref 0–40)
Albumin/Globulin Ratio: 1.5 (ref 1.2–2.2)
Alkaline Phosphatase: 109 IU/L (ref 39–117)
BILIRUBIN TOTAL: 0.3 mg/dL (ref 0.0–1.2)
BUN / CREAT RATIO: 15 (ref 12–28)
BUN: 17 mg/dL (ref 8–27)
CHLORIDE: 102 mmol/L (ref 96–106)
CO2: 19 mmol/L — AB (ref 20–29)
CREATININE: 1.12 mg/dL — AB (ref 0.57–1.00)
Calcium: 9.4 mg/dL (ref 8.7–10.3)
GFR calc non Af Amer: 52 mL/min/{1.73_m2} — ABNORMAL LOW (ref >59)
GFR, EST AFRICAN AMERICAN: 60 mL/min/{1.73_m2} (ref >59)
GLUCOSE: 260 mg/dL — AB (ref 65–99)
Globulin, Total: 2.8 g/dL (ref 1.5–4.5)
Potassium: 4.4 mmol/L (ref 3.5–5.2)
Sodium: 137 mmol/L (ref 134–144)
TOTAL PROTEIN: 6.9 g/dL (ref 6.0–8.5)

## 2018-02-24 LAB — MICROALBUMIN / CREATININE URINE RATIO
Creatinine, Urine: 168 mg/dL
MICROALB/CREAT RATIO: 816.5 mg/g{creat} — AB (ref 0.0–30.0)
Microalbumin, Urine: 1371.7 ug/mL

## 2018-02-24 LAB — HEPATITIS C ANTIBODY (REFLEX)

## 2018-02-24 LAB — HCV COMMENT:

## 2018-02-25 ENCOUNTER — Telehealth: Payer: Self-pay

## 2018-02-25 MED FILL — ?ATORVASTATIN 40MG TAB: 40 | 30 days supply | Qty: 30 | Fill #1

## 2018-02-25 MED FILL — ISOSORBIDE MN ER 60 MG TAB: 60 | 30 days supply | Qty: 30 | Fill #0

## 2018-02-25 MED FILL — ?GLIMEPIRIDE 4 MG TABLET: 4 | 30 days supply | Qty: 30 | Fill #1

## 2018-02-25 MED FILL — LOSARTAN POTASSIUM 100 MG T: 100 | 30 days supply | Qty: 30 | Fill #1

## 2018-02-25 MED FILL — AMLODIPINE BESYLATE 10 MG T: 10 | 30 days supply | Qty: 30 | Fill #1

## 2018-02-25 MED FILL — $HUMULIN 70/30 KWIKPEN: (70-30) 100 | 26 days supply | Qty: 9 | Fill #1

## 2018-02-25 MED FILL — GABAPENTIN 300 MG CAPSULE: 300 | 30 days supply | Qty: 90 | Fill #1

## 2018-02-25 MED FILL — ?METFORMIN HCL 1,000 MG TAB: 1000 | 30 days supply | Qty: 60 | Fill #1

## 2018-02-25 MED FILL — ?CARVEDILOL 25 MG TABLET: 25 | 30 days supply | Qty: 60 | Fill #1

## 2018-02-25 MED FILL — ?FLUOXETINE HCL 20MG TAB: 20 | 30 days supply | Qty: 60 | Fill #1

## 2018-02-25 NOTE — Telephone Encounter (Signed)
Patient was called and informed of lab results via interpretor. 

## 2018-03-16 ENCOUNTER — Ambulatory Visit: Payer: Self-pay | Attending: Family Medicine

## 2018-04-07 ENCOUNTER — Other Ambulatory Visit: Payer: Self-pay | Admitting: Family Medicine

## 2018-04-07 DIAGNOSIS — E1149 Type 2 diabetes mellitus with other diabetic neurological complication: Secondary | ICD-10-CM

## 2018-04-20 MED FILL — GABAPENTIN 300 MG CAPSULE: 300 | 30 days supply | Qty: 90 | Fill #0

## 2018-04-20 MED FILL — ATORVASTATIN CALCIUM 40 MG: 40 | 30 days supply | Qty: 30 | Fill #2

## 2018-04-20 MED FILL — $HUMULIN 70/30 KWIKPEN: (70-30) 100 | 26 days supply | Qty: 9 | Fill #2

## 2018-04-20 MED FILL — FLUoxetine HCL 20 MG TABS: 20 | 30 days supply | Qty: 60 | Fill #2

## 2018-04-20 MED FILL — metFORMIN HCL 1000 MG TABS: 1000 | 30 days supply | Qty: 60 | Fill #2

## 2018-04-20 MED FILL — CARVEDILOL 25 MG TABLET: 25 | 30 days supply | Qty: 60 | Fill #2

## 2018-04-20 MED FILL — LOSARTAN POTASSIUM 100 MG T: 100 | 30 days supply | Qty: 30 | Fill #2

## 2018-04-20 MED FILL — GLIMEPIRIDE 4 MG TABS: 4 | 30 days supply | Qty: 30 | Fill #2

## 2018-04-20 MED FILL — ISOSORBIDE MN ER 60 MG TAB: 60 | 30 days supply | Qty: 30 | Fill #1

## 2018-04-20 MED FILL — AMLODIPINE BESYLATE 10 MG T: 10 | 30 days supply | Qty: 30 | Fill #2

## 2018-05-24 ENCOUNTER — Ambulatory Visit: Payer: Self-pay | Admitting: Family Medicine

## 2018-06-06 ENCOUNTER — Ambulatory Visit: Payer: Self-pay | Attending: Family Medicine | Admitting: Family Medicine

## 2018-06-06 ENCOUNTER — Encounter: Payer: Self-pay | Admitting: Family Medicine

## 2018-06-06 VITALS — BP 168/90 | HR 88 | Temp 98.4°F | Ht 62.0 in | Wt 143.8 lb

## 2018-06-06 DIAGNOSIS — Z9889 Other specified postprocedural states: Secondary | ICD-10-CM | POA: Insufficient documentation

## 2018-06-06 DIAGNOSIS — Z09 Encounter for follow-up examination after completed treatment for conditions other than malignant neoplasm: Secondary | ICD-10-CM | POA: Insufficient documentation

## 2018-06-06 DIAGNOSIS — Z794 Long term (current) use of insulin: Secondary | ICD-10-CM | POA: Insufficient documentation

## 2018-06-06 DIAGNOSIS — F331 Major depressive disorder, recurrent, moderate: Secondary | ICD-10-CM

## 2018-06-06 DIAGNOSIS — E1151 Type 2 diabetes mellitus with diabetic peripheral angiopathy without gangrene: Secondary | ICD-10-CM | POA: Insufficient documentation

## 2018-06-06 DIAGNOSIS — Z79899 Other long term (current) drug therapy: Secondary | ICD-10-CM | POA: Insufficient documentation

## 2018-06-06 DIAGNOSIS — Z79891 Long term (current) use of opiate analgesic: Secondary | ICD-10-CM | POA: Insufficient documentation

## 2018-06-06 DIAGNOSIS — Z7984 Long term (current) use of oral hypoglycemic drugs: Secondary | ICD-10-CM | POA: Insufficient documentation

## 2018-06-06 DIAGNOSIS — E118 Type 2 diabetes mellitus with unspecified complications: Secondary | ICD-10-CM

## 2018-06-06 DIAGNOSIS — Z1211 Encounter for screening for malignant neoplasm of colon: Secondary | ICD-10-CM | POA: Insufficient documentation

## 2018-06-06 DIAGNOSIS — E1149 Type 2 diabetes mellitus with other diabetic neurological complication: Secondary | ICD-10-CM | POA: Insufficient documentation

## 2018-06-06 DIAGNOSIS — E785 Hyperlipidemia, unspecified: Secondary | ICD-10-CM | POA: Insufficient documentation

## 2018-06-06 DIAGNOSIS — F329 Major depressive disorder, single episode, unspecified: Secondary | ICD-10-CM | POA: Insufficient documentation

## 2018-06-06 DIAGNOSIS — I10 Essential (primary) hypertension: Secondary | ICD-10-CM | POA: Insufficient documentation

## 2018-06-06 DIAGNOSIS — I739 Peripheral vascular disease, unspecified: Secondary | ICD-10-CM | POA: Insufficient documentation

## 2018-06-06 DIAGNOSIS — Z7982 Long term (current) use of aspirin: Secondary | ICD-10-CM | POA: Insufficient documentation

## 2018-06-06 LAB — POCT GLYCOSYLATED HEMOGLOBIN (HGB A1C): HBA1C, POC (CONTROLLED DIABETIC RANGE): 9.6 % — AB (ref 0.0–7.0)

## 2018-06-06 LAB — GLUCOSE, POCT (MANUAL RESULT ENTRY): POC Glucose: 111 mg/dl — AB (ref 70–99)

## 2018-06-06 MED ORDER — AMLODIPINE BESYLATE 10 MG PO TABS: 10.0000 mg | ORAL_TABLET | Freq: Every day | ORAL | 1 refills | Status: DC

## 2018-06-06 MED ORDER — GLIMEPIRIDE 4 MG PO TABS: 4.0000 mg | ORAL_TABLET | Freq: Every day | ORAL | 1 refills | Status: DC

## 2018-06-06 MED ORDER — LOSARTAN POTASSIUM 100 MG PO TABS: ORAL_TABLET | ORAL | 1 refills | Status: DC

## 2018-06-06 MED ORDER — GABAPENTIN 300 MG PO CAPS: ORAL_CAPSULE | ORAL | 1 refills | Status: DC

## 2018-06-06 MED ORDER — INSULIN ISOPHANE & REGULAR (HUMAN 70-30)100 UNIT/ML KWIKPEN: PEN_INJECTOR | SUBCUTANEOUS | 3 refills | Status: DC

## 2018-06-06 MED ORDER — FLUOXETINE HCL 20 MG PO TABS: 40.0000 mg | ORAL_TABLET | Freq: Every day | ORAL | 1 refills | Status: DC

## 2018-06-06 MED ORDER — CARVEDILOL 25 MG PO TABS: 25.0000 mg | ORAL_TABLET | Freq: Two times a day (BID) | ORAL | 1 refills | Status: DC

## 2018-06-06 MED ORDER — METFORMIN HCL 1000 MG PO TABS: 1000.0000 mg | ORAL_TABLET | Freq: Two times a day (BID) | ORAL | 1 refills | Status: DC

## 2018-06-06 MED ORDER — ISOSORBIDE MONONITRATE ER 60 MG PO TB24: 60.0000 mg | ORAL_TABLET | Freq: Every day | ORAL | 1 refills | Status: DC

## 2018-06-06 MED FILL — ?ATORVASTATIN 40MG TABLET: 40 | 30 days supply | Qty: 30 | Fill #3

## 2018-06-06 MED FILL — LOSARTAN POTASSIUM 100 MG T: 100 | 30 days supply | Qty: 30 | Fill #3

## 2018-06-06 MED FILL — ISOSORBIDE MN ER 60 MG TAB: 60 | 30 days supply | Qty: 30 | Fill #2

## 2018-06-06 MED FILL — ?FLUOXETINE HCL 20MG TABLET: 20 | 30 days supply | Qty: 60 | Fill #3

## 2018-06-06 MED FILL — ?CARVEDILOL 25 MG TABLET: 25 | 30 days supply | Qty: 60 | Fill #3

## 2018-06-06 MED FILL — ?GLIMEPIRIDE 4 MG TABLET: 4 | 30 days supply | Qty: 30 | Fill #3

## 2018-06-06 MED FILL — $HUMULIN 70/30 KWIKPEN: (70-30) 100 | 30 days supply | Qty: 12 | Fill #0

## 2018-06-06 MED FILL — GABAPENTIN 300 MG CAPSULE: 300 | 30 days supply | Qty: 90 | Fill #1

## 2018-06-06 MED FILL — $HUMULIN 70/30 KWIKPEN: (70-30) 100 | 26 days supply | Qty: 9 | Fill #3

## 2018-06-06 MED FILL — ?METFORMIN HCL 1,000 MG TAB: 1000 | 30 days supply | Qty: 60 | Fill #3

## 2018-06-06 MED FILL — AMLODIPINE BESYLATE 10 MG T: 10 | 30 days supply | Qty: 30 | Fill #3

## 2018-06-06 NOTE — Progress Notes (Signed)
Subjective:  Patient ID: Samantha [name], female    DOB: Aug 07, 1951  Age: 67 y.o. MRN: [id number]  CC: Diabetes   HPI Samantha [name] is a 67 year old female with a history of hypertension, type 2 diabetes mellitus (A1c 9.6), hyperlipidemia, depression, peripheral vascular disease who comes into the clinic for a follow-up visit. A1c is 9.6 which has trended up from 9.4 previously and rather than taking her Humulin 70/30 twice daily 17 units in the morning and evening she has been taking 17 units in the morning and 12 units in the evening because taking 17 units in the evening causes her "to feel cold".  She denies other hypoglycemic symptoms and does not check her sugars when she "feels cold".  She informs me her fasting sugars have been in the 200s and random sugars in the 270 range.  Her diabetic neuropathy is controlled on gabapentin and she denies visual concerns. Her blood pressure is elevated and she has been out of her antihypertensives. Was previously compliant with her statin until she ran out and denies myalgias. She is accompanied by family member today and has no additional concerns.  Past Medical History:  Diagnosis Date  . Acute pyelonephritis   . Bacteremia, escherichia coli 01/12/2015  . Chest pain   . Critical lower limb ischemia   . Hyperlipidemia   . Hypertension   . Left carotid bruit   . PAD (peripheral artery disease) (HCC)   . Type II diabetes mellitus (HCC)     Past Surgical History:  Procedure Laterality Date  . ABDOMINAL ANGIOGRAM  05/16/2015   Procedure: Abdominal Angiogram;  Surgeon: Runell Gess, MD;  Location: [facility] INVASIVE CV LAB;  Service: Cardiovascular;;  . ABDOMINAL HYSTERECTOMY  ~ 2000  . PERIPHERAL VASCULAR CATHETERIZATION Bilateral 05/16/2015   Procedure: Lower Extremity Angiography;  Surgeon: Runell Gess, MD; renal arteries widely patent, L-SFA 75%, L-pop 95%, L-peroneal 90%; R-SFA 40%, R-pop 50%, R-prox peroneal 90%; directional  atherectomy L-SFA, p-pop, drug-eluting balloon angioplasty reducing the stenoses to 0, small linear dissection was not flow limiting      . PERIPHERAL VASCULAR CATHETERIZATION Left 05/16/2015   Procedure: Peripheral Vascular Atherectomy;  Surgeon: Runell Gess, MD;  Location: [facility] INVASIVE CV LAB;  Service: Cardiovascular;  Laterality: Left;  sfa    No Known Allergies   Outpatient Medications Prior to Visit  Medication Sig Dispense Refill  . aspirin 81 MG EC tablet Take 1 tablet (81 mg total) by mouth daily. 30 tablet 3  . atorvastatin (LIPITOR) 40 MG tablet Take 1 tablet (40 mg total) by mouth daily. 90 tablet 1  . Blood Glucose Monitoring Suppl (TRUE METRIX METER) DEVI 1 each by Does not apply route 3 (three) times daily before meals. 1 Device 0  . glucose blood (TRUE METRIX BLOOD GLUCOSE TEST) test strip Use 3 times daily before meals 100 each 12  . ibuprofen (ADVIL,MOTRIN) 200 MG tablet Take 400 mg by mouth every 6 (six) hours as needed (pain/fever). Reported on 06/10/2016    . Insulin Pen Needle (PEN NEEDLES 3/16") 31G X 5 MM MISC 1 Stick by Does not apply route 2 (two) times daily at 8 am and 10 pm. 100 each 12  . Insulin Syringe-Needle U-100 (BD INSULIN SYRINGE ULTRAFINE) 31G X 15/64" 0.5 ML MISC Use subcutaneously twice daily 60 each 12  . TRUEPLUS LANCETS 28G MISC 1 each by Does not apply route 3 (three) times daily before meals. 100 each 12  . carvedilol (COREG) 25 MG  tablet Take 1 tablet (25 mg total) by mouth 2 (two) times daily with a meal. 180 tablet 1  . FLUoxetine (PROZAC) 20 MG tablet Take 2 tablets (40 mg total) by mouth daily. 180 tablet 1  . gabapentin (NEURONTIN) 300 MG capsule TAKE 1 CAPSULE BY MOUTH 3 (THREE) TIMES DAILY. 90 capsule 1  . glimepiride (AMARYL) 4 MG tablet Take 1 tablet (4 mg total) by mouth daily with breakfast. 90 tablet 1  . Insulin Isophane & Regular Human (HUMULIN 70/30 KWIKPEN) (70-30) 100 UNIT/ML PEN Inject 17 Units into the skin 2 (two) times daily  with a meal. 30 mL 3  . isosorbide mononitrate (IMDUR) 60 MG 24 hr tablet Take 1 tablet (60 mg total) by mouth daily. 30 tablet 3  . losartan (COZAAR) 100 MG tablet TAKE 1 TABLET BY MOUTH DAILY. 90 tablet 1  . metFORMIN (GLUCOPHAGE) 1000 MG tablet Take 1 tablet (1,000 mg total) by mouth 2 (two) times daily with a meal. 180 tablet 1  . amLODipine (NORVASC) 10 MG tablet Take 1 tablet (10 mg total) by mouth daily. (Patient not taking: Reported on 06/06/2018) 90 tablet 1  . valsartan-hydrochlorothiazide (DIOVAN-HCT) 160-12.5 MG per tablet 1 tablet      No facility-administered medications prior to visit.     ROS Review of Systems  Constitutional: Negative for activity change, appetite change and fatigue.  HENT: Negative for congestion, sinus pressure and sore throat.   Eyes: Negative for visual disturbance.  Respiratory: Negative for cough, chest tightness, shortness of breath and wheezing.   Cardiovascular: Negative for chest pain and palpitations.  Gastrointestinal: Negative for abdominal distention, abdominal pain and constipation.  Endocrine: Negative for polydipsia.  Genitourinary: Negative for dysuria and frequency.  Musculoskeletal: Negative for arthralgias and back pain.  Skin: Negative for rash.  Neurological: Negative for tremors, light-headedness and numbness.  Hematological: Does not bruise/bleed easily.  Psychiatric/Behavioral: Negative for agitation and behavioral problems.    Objective:  BP (!) 168/90 Comment: no bp meds for 2 days  Pulse 88   Temp 98.4 F (36.9 C) (Oral)   Ht 5\' 2"  (1.575 m)   Wt 143 lb 12.8 oz (65.2 kg)   SpO2 98%   BMI 26.30 kg/m   BP/Weight 06/06/2018 02/23/2018 01/11/2018  Systolic BP 168 176 199  Diastolic BP 90 78 94  Wt. (Lbs) 143.8 144.4 144  BMI 26.3 26.41 26.34      Physical Exam  Constitutional: She is oriented to person, place, and time. She appears well-developed and well-nourished.  Cardiovascular: Normal rate, normal heart  sounds and intact distal pulses.  No murmur heard. Pulmonary/Chest: Effort normal and breath sounds normal. She has no wheezes. She has no rales. She exhibits no tenderness.  Abdominal: Soft. Bowel sounds are normal. She exhibits no distension and no mass. There is no tenderness.  Musculoskeletal: Normal range of motion.  Neurological: She is alert and oriented to person, place, and time.  Skin: Skin is warm and dry.  Psychiatric: She has a normal mood and affect.     CMP Latest Ref Rng & Units 02/23/2018 03/24/2017 12/23/2016  Glucose 65 - 99 mg/dL 295(A) 213(Y) 865(H)  BUN 8 - 27 mg/dL 17 23 15   Creatinine 0.57 - 1.00 mg/dL 8.46(N) 6.29(B) 2.84(X)  Sodium 134 - 144 mmol/L 137 140 137  Potassium 3.5 - 5.2 mmol/L 4.4 4.6 4.6  Chloride 96 - 106 mmol/L 102 101 103  CO2 20 - 29 mmol/L 19(L) 22 21  Calcium 8.7 -  10.3 mg/dL 9.4 9.5 9.2  Total Protein 6.0 - 8.5 g/dL 6.9 6.9 6.4  Total Bilirubin 0.0 - 1.2 mg/dL 0.3 0.3 0.4  Alkaline Phos 39 - 117 IU/L 109 91 88  AST 0 - 40 IU/L 15 21 24   ALT 0 - 32 IU/L 17 21 23     Lipid Panel     Component Value Date/Time   CHOL 133 03/24/2017 0943   TRIG 126 03/24/2017 0943   HDL 49 03/24/2017 0943   CHOLHDL 2.7 03/24/2017 0943   CHOLHDL 3.2 02/12/2016 1707   VLDL 27 02/12/2016 1707   LDLCALC 59 03/24/2017 0943    Lab Results  Component Value Date   HGBA1C 9.6 (A) 06/06/2018    Assessment & Plan:   1. Type 2 diabetes mellitus with complication, with long-term current use of insulin (HCC) Trolled with A1c of 9.6 Increased dose of Humulin 70/30 Counseled on Diabetic diet, my plate method, 409150 minutes of moderate intensity exercise/week Keep blood sugar logs with fasting goals of 80-120 mg/dl, random of less than 811180 and in the event of sugars less than 60 mg/dl or greater than 914400 mg/dl please notify the clinic ASAP. It is recommended that you undergo annual eye exams and annual foot exams. Pneumonia vaccine is recommended. - POCT glucose  (manual entry) - POCT glycosylated hemoglobin (Hb A1C) - Insulin Isophane & Regular Human (HUMULIN 70/30 KWIKPEN) (70-30) 100 UNIT/ML PEN; Inject subcutaneously twice daily 25 units in the morning and 15 units in the evening  Dispense: 30 mL; Refill: 3 - metFORMIN (GLUCOPHAGE) 1000 MG tablet; Take 1 tablet (1,000 mg total) by mouth 2 (two) times daily with a meal.  Dispense: 180 tablet; Refill: 1 - glimepiride (AMARYL) 4 MG tablet; Take 1 tablet (4 mg total) by mouth daily with breakfast.  Dispense: 90 tablet; Refill: 1  2. Essential hypertension Uncontrolled due to running out of medications which I refilled Counseled on blood pressure goal of less than 130/80, low-sodium, DASH diet, medication compliance, 150 minutes of moderate intensity exercise per week. Discussed medication compliance, adverse effects. - losartan (COZAAR) 100 MG tablet; TAKE 1 TABLET BY MOUTH DAILY.  Dispense: 90 tablet; Refill: 1 - isosorbide mononitrate (IMDUR) 60 MG 24 hr tablet; Take 1 tablet (60 mg total) by mouth daily.  Dispense: 90 tablet; Refill: 1 - carvedilol (COREG) 25 MG tablet; Take 1 tablet (25 mg total) by mouth 2 (two) times daily with a meal.  Dispense: 180 tablet; Refill: 1 - amLODipine (NORVASC) 10 MG tablet; Take 1 tablet (10 mg total) by mouth daily.  Dispense: 90 tablet; Refill: 1  3. Type II diabetes mellitus with neurological manifestations (HCC) Stable - gabapentin (NEURONTIN) 300 MG capsule; TAKE 1 CAPSULE BY MOUTH 3 (THREE) TIMES DAILY.  Dispense: 90 capsule; Refill: 1  4. Moderate episode of recurrent major depressive disorder (HCC) Controlled - FLUoxetine (PROZAC) 20 MG tablet; Take 2 tablets (40 mg total) by mouth daily.  Dispense: 180 tablet; Refill: 1  5. Screening for colon cancer - Ambulatory referral to Gastroenterology   Meds ordered this encounter  Medications  . Insulin Isophane & Regular Human (HUMULIN 70/30 KWIKPEN) (70-30) 100 UNIT/ML PEN    Sig: Inject subcutaneously  twice daily 25 units in the morning and 15 units in the evening    Dispense:  30 mL    Refill:  3    Discontinue previous dose  . metFORMIN (GLUCOPHAGE) 1000 MG tablet    Sig: Take 1 tablet (1,000 mg total) by  mouth 2 (two) times daily with a meal.    Dispense:  180 tablet    Refill:  1  . losartan (COZAAR) 100 MG tablet    Sig: TAKE 1 TABLET BY MOUTH DAILY.    Dispense:  90 tablet    Refill:  1  . isosorbide mononitrate (IMDUR) 60 MG 24 hr tablet    Sig: Take 1 tablet (60 mg total) by mouth daily.    Dispense:  90 tablet    Refill:  1  . glimepiride (AMARYL) 4 MG tablet    Sig: Take 1 tablet (4 mg total) by mouth daily with breakfast.    Dispense:  90 tablet    Refill:  1  . gabapentin (NEURONTIN) 300 MG capsule    Sig: TAKE 1 CAPSULE BY MOUTH 3 (THREE) TIMES DAILY.    Dispense:  90 capsule    Refill:  1  . FLUoxetine (PROZAC) 20 MG tablet    Sig: Take 2 tablets (40 mg total) by mouth daily.    Dispense:  180 tablet    Refill:  1  . carvedilol (COREG) 25 MG tablet    Sig: Take 1 tablet (25 mg total) by mouth 2 (two) times daily with a meal.    Dispense:  180 tablet    Refill:  1  . amLODipine (NORVASC) 10 MG tablet    Sig: Take 1 tablet (10 mg total) by mouth daily.    Dispense:  90 tablet    Refill:  1    Follow-up: Return in about 3 months (around 09/06/2018) for Follow-up of chronic medical conditions.   Hoy Register MD

## 2018-07-19 MED FILL — LOSARTAN POTASSIUM 100 MG T: 100 | 30 days supply | Qty: 30 | Fill #4

## 2018-07-19 MED FILL — AMLODIPINE BESYLATE 10 MG T: 10 | 30 days supply | Qty: 30 | Fill #4

## 2018-07-19 MED FILL — GABAPENTIN 300 MG CAPSULE: 300 | 30 days supply | Qty: 90 | Fill #0

## 2018-07-19 MED FILL — ?ATORVASTATIN 40MG TABLET: 40 | 30 days supply | Qty: 30 | Fill #4

## 2018-07-19 MED FILL — ?CARVEDILOL 25 MG TABLET: 25 | 30 days supply | Qty: 60 | Fill #4

## 2018-07-19 MED FILL — ?GLIMEPIRIDE 4 MG TABLET: 4 | 30 days supply | Qty: 30 | Fill #4

## 2018-07-19 MED FILL — ?FLUOXETINE HCL 20MG TABLET: 20 | 30 days supply | Qty: 60 | Fill #4

## 2018-07-19 MED FILL — $HUMULIN 70/30 KWIKPEN: (70-30) 100 | 30 days supply | Qty: 12 | Fill #1

## 2018-07-19 MED FILL — ISOSORBIDE MN ER 60 MG TAB: 60 | 30 days supply | Qty: 30 | Fill #3

## 2018-07-21 ENCOUNTER — Other Ambulatory Visit: Payer: Self-pay | Admitting: Family Medicine

## 2018-07-21 DIAGNOSIS — E118 Type 2 diabetes mellitus with unspecified complications: Secondary | ICD-10-CM

## 2018-07-21 DIAGNOSIS — Z794 Long term (current) use of insulin: Principal | ICD-10-CM

## 2018-07-21 MED FILL — TRUE METRIX TEST STRIP: 30 days supply | Qty: 100 | Fill #0

## 2018-07-21 MED FILL — TRUEPLUS PEN NDL 31G X 1/4: 31G X 6 MM | 50 days supply | Qty: 100 | Fill #1

## 2018-07-21 MED FILL — TRUEPLUS PEN NDL 31G X 1/4": 31G X 6 MM | 50 days supply | Qty: 100 | Fill #1

## 2018-08-22 ENCOUNTER — Other Ambulatory Visit: Payer: Self-pay | Admitting: Family Medicine

## 2018-08-22 DIAGNOSIS — E1149 Type 2 diabetes mellitus with other diabetic neurological complication: Secondary | ICD-10-CM

## 2018-08-22 DIAGNOSIS — I10 Essential (primary) hypertension: Secondary | ICD-10-CM

## 2018-08-22 DIAGNOSIS — Z794 Long term (current) use of insulin: Secondary | ICD-10-CM

## 2018-08-22 DIAGNOSIS — E118 Type 2 diabetes mellitus with unspecified complications: Secondary | ICD-10-CM

## 2018-08-22 DIAGNOSIS — F331 Major depressive disorder, recurrent, moderate: Secondary | ICD-10-CM

## 2018-08-22 MED FILL — LOSARTAN POTASSIUM 100 MG T: 100 | 30 days supply | Qty: 30 | Fill #5

## 2018-08-22 MED FILL — ?CARVEDILOL 25 MG TABLET: 25 | 30 days supply | Qty: 60 | Fill #5

## 2018-08-22 MED FILL — ISOSORBIDE MN ER 60 MG TAB: 60 | 30 days supply | Qty: 30 | Fill #0

## 2018-08-22 MED FILL — ?ATORVASTATIN 40MG TABLET: 40 | 30 days supply | Qty: 30 | Fill #5

## 2018-08-22 MED FILL — GABAPENTIN 300 MG CAPSULE: 300 | 30 days supply | Qty: 90 | Fill #1

## 2018-08-22 MED FILL — TRUE METRIX TEST STRIP: 30 days supply | Qty: 100 | Fill #1

## 2018-08-22 MED FILL — $HUMULIN 70/30 KWIKPEN: (70-30) 100 | 30 days supply | Qty: 12 | Fill #2

## 2018-08-22 MED FILL — ?FLUOXETINE HCL 20MG TABLET: 20 | 30 days supply | Qty: 60 | Fill #5

## 2018-08-22 MED FILL — AMLODIPINE BESYLATE 10 MG T: 10 | 30 days supply | Qty: 30 | Fill #5

## 2018-08-22 MED FILL — ?GLIMEPIRIDE 4 MG TABLET: 4 | 30 days supply | Qty: 30 | Fill #5

## 2018-09-13 ENCOUNTER — Encounter: Payer: Self-pay | Admitting: Family Medicine

## 2018-09-13 ENCOUNTER — Ambulatory Visit: Payer: Self-pay | Attending: Family Medicine | Admitting: Family Medicine

## 2018-09-13 VITALS — BP 137/77 | HR 62 | Temp 97.9°F | Ht 62.0 in | Wt 148.2 lb

## 2018-09-13 DIAGNOSIS — E1140 Type 2 diabetes mellitus with diabetic neuropathy, unspecified: Secondary | ICD-10-CM | POA: Insufficient documentation

## 2018-09-13 DIAGNOSIS — Z90710 Acquired absence of both cervix and uterus: Secondary | ICD-10-CM | POA: Insufficient documentation

## 2018-09-13 DIAGNOSIS — Z79899 Other long term (current) drug therapy: Secondary | ICD-10-CM | POA: Insufficient documentation

## 2018-09-13 DIAGNOSIS — Z114 Encounter for screening for human immunodeficiency virus [HIV]: Secondary | ICD-10-CM | POA: Insufficient documentation

## 2018-09-13 DIAGNOSIS — E1149 Type 2 diabetes mellitus with other diabetic neurological complication: Secondary | ICD-10-CM

## 2018-09-13 DIAGNOSIS — E7800 Pure hypercholesterolemia, unspecified: Secondary | ICD-10-CM | POA: Insufficient documentation

## 2018-09-13 DIAGNOSIS — I10 Essential (primary) hypertension: Secondary | ICD-10-CM | POA: Insufficient documentation

## 2018-09-13 DIAGNOSIS — Z7982 Long term (current) use of aspirin: Secondary | ICD-10-CM | POA: Insufficient documentation

## 2018-09-13 DIAGNOSIS — E1151 Type 2 diabetes mellitus with diabetic peripheral angiopathy without gangrene: Secondary | ICD-10-CM | POA: Insufficient documentation

## 2018-09-13 DIAGNOSIS — F329 Major depressive disorder, single episode, unspecified: Secondary | ICD-10-CM | POA: Insufficient documentation

## 2018-09-13 DIAGNOSIS — Z9862 Peripheral vascular angioplasty status: Secondary | ICD-10-CM | POA: Insufficient documentation

## 2018-09-13 DIAGNOSIS — E118 Type 2 diabetes mellitus with unspecified complications: Secondary | ICD-10-CM

## 2018-09-13 DIAGNOSIS — Z23 Encounter for immunization: Secondary | ICD-10-CM

## 2018-09-13 DIAGNOSIS — Z794 Long term (current) use of insulin: Secondary | ICD-10-CM | POA: Insufficient documentation

## 2018-09-13 DIAGNOSIS — Z1159 Encounter for screening for other viral diseases: Secondary | ICD-10-CM

## 2018-09-13 LAB — POCT GLYCOSYLATED HEMOGLOBIN (HGB A1C): Hemoglobin A1C: 9 % — AB (ref 4.0–5.6)

## 2018-09-13 LAB — GLUCOSE, POCT (MANUAL RESULT ENTRY): POC Glucose: 155 mg/dl — AB (ref 70–99)

## 2018-09-13 MED ORDER — INSULIN ISOPHANE & REGULAR (HUMAN 70-30)100 UNIT/ML KWIKPEN: PEN_INJECTOR | SUBCUTANEOUS | 3 refills | Status: DC

## 2018-09-13 MED FILL — $HUMULIN 70/30 KWIKPEN: (70-30) 100 | 90 days supply | Qty: 45 | Fill #0

## 2018-09-13 NOTE — Progress Notes (Signed)
Subjective:  Patient ID: Samantha [name], female    DOB: 03/18/1951  Age: 67 y.o. MRN: [id number]  CC: Diabetes   HPI Naisha Wisdom is a 67 year old female with a history of hypertension, type 2 diabetes mellitus (A1c 9.0), hyperlipidemia, depression, peripheral vascular disease who comes into the clinic for a follow-up visit. Her A1c is 9.0 which is slightly lower than 9.6 previously and she is not here with her blood sugar logs and is unable to tell me with her blood sugars at home have been.  She is not compliant with a diabetic diet or exercise.  Her neuropathy is controlled on gabapentin and she denies hypoglycemia symptoms or visual concerns.  Unable to undergo eye exam due to lack of medical coverage. Declines Pneumovax today. Doing well on her statin and antihypertensive with no complaints of adverse effects. She is accompanied by her daughter today and is seen with the aid of a Spanish video interpreter; she has no additional concerns.  Past Medical History:  Diagnosis Date  . Acute pyelonephritis   . Bacteremia, escherichia coli 01/12/2015  . Chest pain   . Critical lower limb ischemia   . Hyperlipidemia   . Hypertension   . Left carotid bruit   . PAD (peripheral artery disease) ([place])   . Type II diabetes mellitus ([place])     Past Surgical History:  Procedure Laterality Date  . ABDOMINAL ANGIOGRAM  05/16/2015   Procedure: Abdominal Angiogram;  Surgeon: Lorretta Harp, MD;  Location: [place] [facility];  Service: Cardiovascular;;  . ABDOMINAL HYSTERECTOMY  ~ 2000  . PERIPHERAL VASCULAR CATHETERIZATION Bilateral 05/16/2015   Procedure: Lower Extremity Angiography;  Surgeon: Lorretta Harp, MD; renal arteries widely patent, L-SFA 75%, L-pop 95%, L-peroneal 90%; R-SFA 40%, R-pop 50%, R-prox peroneal 90%; directional atherectomy L-SFA, p-pop, drug-eluting balloon angioplasty reducing the stenoses to 0, small linear dissection was not flow limiting      . PERIPHERAL  VASCULAR CATHETERIZATION Left 05/16/2015   Procedure: Peripheral Vascular Atherectomy;  Surgeon: Lorretta Harp, MD;  Location: [place] CV LAB;  Service: Cardiovascular;  Laterality: Left;  sfa    No Known Allergies   Outpatient Medications Prior to Visit  Medication Sig Dispense Refill  . amLODipine (NORVASC) 10 MG tablet Take 1 tablet (10 mg total) by mouth daily. 90 tablet 1  . aspirin 81 MG EC tablet Take 1 tablet (81 mg total) by mouth daily. 30 tablet 3  . atorvastatin (LIPITOR) 40 MG tablet Take 1 tablet (40 mg total) by mouth daily. 90 tablet 1  . Blood Glucose Monitoring Suppl (TRUE METRIX METER) DEVI 1 each by Does not apply route 3 (three) times daily before meals. 1 Device 0  . carvedilol (COREG) 25 MG tablet Take 1 tablet (25 mg total) by mouth 2 (two) times daily with a meal. 180 tablet 1  . FLUoxetine (PROZAC) 20 MG tablet Take 2 tablets (40 mg total) by mouth daily. 180 tablet 1  . gabapentin (NEURONTIN) 300 MG capsule TAKE 1 CAPSULE BY MOUTH 3 (THREE) TIMES DAILY. 90 capsule 1  . glimepiride (AMARYL) 4 MG tablet Take 1 tablet (4 mg total) by mouth daily with breakfast. 90 tablet 1  . glucose blood test strip Use 3 times daily before meals 100 each 12  . ibuprofen (ADVIL,MOTRIN) 200 MG tablet Take 400 mg by mouth every 6 (six) hours as needed (pain/fever). Reported on 06/10/2016    . Insulin Pen Needle (PEN NEEDLES 3/16") 31G X 5 MM  MISC 1 Stick by Does not apply route 2 (two) times daily at 8 am and 10 pm. 100 each 12  . Insulin Syringe-Needle U-100 (BD INSULIN SYRINGE ULTRAFINE) 31G X 15/64" 0.5 ML MISC Use subcutaneously twice daily 60 each 12  . isosorbide mononitrate (IMDUR) 60 MG 24 hr tablet Take 1 tablet (60 mg total) by mouth daily. 90 tablet 1  . losartan (COZAAR) 100 MG tablet TAKE 1 TABLET BY MOUTH DAILY. 90 tablet 1  . metFORMIN (GLUCOPHAGE) 1000 MG tablet Take 1 tablet (1,000 mg total) by mouth 2 (two) times daily with a meal. 180 tablet 1  . TRUEPLUS LANCETS  28G MISC 1 each by Does not apply route 3 (three) times daily before meals. 100 each 12  . Insulin Isophane & Regular Human (HUMULIN 70/30 KWIKPEN) (70-30) 100 UNIT/ML PEN Inject subcutaneously twice daily 25 units in the morning and 15 units in the evening 30 mL 3   No facility-administered medications prior to visit.     ROS Review of Systems  Constitutional: Negative for activity change, appetite change and fatigue.  HENT: Negative for congestion, sinus pressure and sore throat.   Eyes: Negative for visual disturbance.  Respiratory: Negative for cough, chest tightness, shortness of breath and wheezing.   Cardiovascular: Negative for chest pain and palpitations.  Gastrointestinal: Negative for abdominal distention, abdominal pain and constipation.  Endocrine: Negative for polydipsia.  Genitourinary: Negative for dysuria and frequency.  Musculoskeletal: Negative for arthralgias and back pain.  Skin: Negative for rash.  Neurological: Negative for tremors, light-headedness and numbness.  Hematological: Does not bruise/bleed easily.  Psychiatric/Behavioral: Negative for agitation and behavioral problems.    Objective:  BP 137/77   Pulse 62   Temp 97.9 F (36.6 C) (Oral)   Ht '5\' 2"'  (1.575 m)   Wt 148 lb 3.2 oz (67.2 kg)   SpO2 97%   BMI 27.11 kg/m   BP/Weight 09/13/2018 06/06/2018 2/56/3893  Systolic BP 734 287 681  Diastolic BP 77 90 78  Wt. (Lbs) 148.2 143.8 144.4  BMI 27.11 26.3 26.41      Physical Exam  Constitutional: She is oriented to person, place, and time. She appears well-developed and well-nourished.  HENT:  Right Ear: External ear normal.  Left Ear: External ear normal.  Mouth/Throat: Oropharynx is clear and moist.  Cardiovascular: Normal rate, normal heart sounds and intact distal pulses.  No murmur heard. Pulmonary/Chest: Effort normal and breath sounds normal. She has no wheezes. She has no rales. She exhibits no tenderness.  Abdominal: Soft. Bowel  sounds are normal. She exhibits no distension and no mass. There is no tenderness.  Musculoskeletal: Normal range of motion.  Neurological: She is alert and oriented to person, place, and time.  Skin: Skin is warm and dry.  Psychiatric: She has a normal mood and affect.    CMP Latest Ref Rng & Units 02/23/2018 03/24/2017 12/23/2016  Glucose 65 - 99 mg/dL 260(H) 145(H) 318(H)  BUN 8 - 27 mg/dL '17 23 15  ' Creatinine 0.57 - 1.00 mg/dL 1.12(H) 1.13(H) 1.02(H)  Sodium 134 - 144 mmol/L 137 140 137  Potassium 3.5 - 5.2 mmol/L 4.4 4.6 4.6  Chloride 96 - 106 mmol/L 102 101 103  CO2 20 - 29 mmol/L 19(L) 22 21  Calcium 8.7 - 10.3 mg/dL 9.4 9.5 9.2  Total Protein 6.0 - 8.5 g/dL 6.9 6.9 6.4  Total Bilirubin 0.0 - 1.2 mg/dL 0.3 0.3 0.4  Alkaline Phos 39 - 117 IU/L 109 91 88  AST 0 - 40 IU/L '15 21 24  ' ALT 0 - 32 IU/L '17 21 23    ' Lipid Panel     Component Value Date/Time   CHOL 133 03/24/2017 0943   TRIG 126 03/24/2017 0943   HDL 49 03/24/2017 0943   CHOLHDL 2.7 03/24/2017 0943   CHOLHDL 3.2 02/12/2016 1707   VLDL 27 02/12/2016 1707   LDLCALC 59 03/24/2017 0943    Lab Results  Component Value Date   HGBA1C 9.0 (A) 09/13/2018    Assessment & Plan:   1. Type 2 diabetes mellitus with complication, with long-term current use of insulin (HCC) Uncontrolled with A1c of 9.0 Increased dose of Humulin 70/30 Emphasized the need to be compliant with a diabetic diet and exercise - POCT glucose (manual entry) - POCT glycosylated hemoglobin (Hb A1C) - CMP14+EGFR - Lipid panel - Insulin Isophane & Regular Human (HUMULIN 70/30 KWIKPEN) (70-30) 100 UNIT/ML PEN; Inject subcutaneously twice daily 30 units in the morning and 20 units in the evening  Dispense: 30 mL; Refill: 3  2. Type II diabetes mellitus with neurological manifestations (HCC) Controlled on gabapentin  3. Pure hypercholesterolemia Controlled Lipid panel today Continue statin  4. Screening for viral disease - HIV Antibody  (routine testing w rflx)  5. Essential hypertension Controlled Continue losartan Counseled on blood pressure goal of less than 130/80, low-sodium, DASH diet, medication compliance, 150 minutes of moderate intensity exercise per week. Discussed medication compliance, adverse effects.    Meds ordered this encounter  Medications  . Insulin Isophane & Regular Human (HUMULIN 70/30 KWIKPEN) (70-30) 100 UNIT/ML PEN    Sig: Inject subcutaneously twice daily 30 units in the morning and 20 units in the evening    Dispense:  30 mL    Refill:  3    Discontinue previous dose    Follow-up: Return in about 3 months (around 12/13/2018) for Follow-up of chronic medical conditions.   Charlott Rakes MD

## 2018-09-13 NOTE — Patient Instructions (Signed)
Diabetes Mellitus and Nutrition When you have diabetes (diabetes mellitus), it is very important to have healthy eating habits because your blood sugar (glucose) levels are greatly affected by what you eat and drink. Eating healthy foods in the appropriate amounts, at about the same times every day, can help you:  Control your blood glucose.  Lower your risk of heart disease.  Improve your blood pressure.  Reach or maintain a healthy weight.  Every person with diabetes is different, and each person has different needs for a meal plan. Your health care provider may recommend that you work with a diet and nutrition specialist (dietitian) to make a meal plan that is best for you. Your meal plan may vary depending on factors such as:  The calories you need.  The medicines you take.  Your weight.  Your blood glucose, blood pressure, and cholesterol levels.  Your activity level.  Other health conditions you have, such as heart or kidney disease.  How do carbohydrates affect me? Carbohydrates affect your blood glucose level more than any other type of food. Eating carbohydrates naturally increases the amount of glucose in your blood. Carbohydrate counting is a method for keeping track of how many carbohydrates you eat. Counting carbohydrates is important to keep your blood glucose at a healthy level, especially if you use insulin or take certain oral diabetes medicines. It is important to know how many carbohydrates you can safely have in each meal. This is different for every person. Your dietitian can help you calculate how many carbohydrates you should have at each meal and for snack. Foods that contain carbohydrates include:  Bread, cereal, rice, pasta, and crackers.  Potatoes and corn.  Peas, beans, and lentils.  Milk and yogurt.  Fruit and juice.  Desserts, such as cakes, cookies, ice cream, and candy.  How does alcohol affect me? Alcohol can cause a sudden decrease in blood  glucose (hypoglycemia), especially if you use insulin or take certain oral diabetes medicines. Hypoglycemia can be a life-threatening condition. Symptoms of hypoglycemia (sleepiness, dizziness, and confusion) are similar to symptoms of having too much alcohol. If your health care provider says that alcohol is safe for you, follow these guidelines:  Limit alcohol intake to no more than 1 drink per day for nonpregnant women and 2 drinks per day for men. One drink equals 12 oz of beer, 5 oz of wine, or 1 oz of hard liquor.  Do not drink on an empty stomach.  Keep yourself hydrated with water, diet soda, or unsweetened iced tea.  Keep in mind that regular soda, juice, and other mixers may contain a lot of sugar and must be counted as carbohydrates.  What are tips for following this plan? Reading food labels  Start by checking the serving size on the label. The amount of calories, carbohydrates, fats, and other nutrients listed on the label are based on one serving of the food. Many foods contain more than one serving per package.  Check the total grams (g) of carbohydrates in one serving. You can calculate the number of servings of carbohydrates in one serving by dividing the total carbohydrates by 15. For example, if a food has 30 g of total carbohydrates, it would be equal to 2 servings of carbohydrates.  Check the number of grams (g) of saturated and trans fats in one serving. Choose foods that have low or no amount of these fats.  Check the number of milligrams (mg) of sodium in one serving. Most people   should limit total sodium intake to less than 2,300 mg per day.  Always check the nutrition information of foods labeled as "low-fat" or "nonfat". These foods may be higher in added sugar or refined carbohydrates and should be avoided.  Talk to your dietitian to identify your daily goals for nutrients listed on the label. Shopping  Avoid buying canned, premade, or processed foods. These  foods tend to be high in fat, sodium, and added sugar.  Shop around the outside edge of the grocery store. This includes fresh fruits and vegetables, bulk grains, fresh meats, and fresh dairy. Cooking  Use low-heat cooking methods, such as baking, instead of high-heat cooking methods like deep frying.  Cook using healthy oils, such as olive, canola, or sunflower oil.  Avoid cooking with butter, cream, or high-fat meats. Meal planning  Eat meals and snacks regularly, preferably at the same times every day. Avoid going long periods of time without eating.  Eat foods high in fiber, such as fresh fruits, vegetables, beans, and whole grains. Talk to your dietitian about how many servings of carbohydrates you can eat at each meal.  Eat 4-6 ounces of lean protein each day, such as lean meat, chicken, fish, eggs, or tofu. 1 ounce is equal to 1 ounce of meat, chicken, or fish, 1 egg, or 1/4 cup of tofu.  Eat some foods each day that contain healthy fats, such as avocado, nuts, seeds, and fish. Lifestyle   Check your blood glucose regularly.  Exercise at least 30 minutes 5 or more days each week, or as told by your health care provider.  Take medicines as told by your health care provider.  Do not use any products that contain nicotine or tobacco, such as cigarettes and e-cigarettes. If you need help quitting, ask your health care provider.  Work with a counselor or diabetes educator to identify strategies to manage stress and any emotional and social challenges. What are some questions to ask my health care provider?  Do I need to meet with a diabetes educator?  Do I need to meet with a dietitian?  What number can I call if I have questions?  When are the best times to check my blood glucose? Where to find more information:  American Diabetes Association: diabetes.org/food-and-fitness/food  Academy of Nutrition and Dietetics:  www.eatright.org/resources/health/diseases-and-conditions/diabetes  National Institute of Diabetes and Digestive and Kidney Diseases (NIH): www.niddk.nih.gov/health-information/diabetes/overview/diet-eating-physical-activity Summary  A healthy meal plan will help you control your blood glucose and maintain a healthy lifestyle.  Working with a diet and nutrition specialist (dietitian) can help you make a meal plan that is best for you.  Keep in mind that carbohydrates and alcohol have immediate effects on your blood glucose levels. It is important to count carbohydrates and to use alcohol carefully. This information is not intended to replace advice given to you by your health care provider. Make sure you discuss any questions you have with your health care provider. Document Released: 09/10/2005 Document Revised: 01/18/2017 Document Reviewed: 01/18/2017 Elsevier Interactive Patient Education  2018 [employer]  

## 2018-09-14 LAB — LIPID PANEL
CHOLESTEROL TOTAL: 139 mg/dL (ref 100–199)
Chol/HDL Ratio: 3.1 ratio (ref 0.0–4.4)
HDL: 45 mg/dL (ref >39)
LDL Calculated: 74 mg/dL (ref 0–99)
Triglycerides: 102 mg/dL (ref 0–149)
VLDL Cholesterol Cal: 20 mg/dL (ref 5–40)

## 2018-09-14 LAB — CMP14+EGFR
ALK PHOS: 112 IU/L (ref 39–117)
ALT: 18 IU/L (ref 0–32)
AST: 22 IU/L (ref 0–40)
Albumin/Globulin Ratio: 1.8 (ref 1.2–2.2)
Albumin: 4.3 g/dL (ref 3.6–4.8)
BUN/Creatinine Ratio: 14 (ref 12–28)
BUN: 19 mg/dL (ref 8–27)
Bilirubin Total: 0.4 mg/dL (ref 0.0–1.2)
CALCIUM: 9.3 mg/dL (ref 8.7–10.3)
CO2: 18 mmol/L — AB (ref 20–29)
CREATININE: 1.37 mg/dL — AB (ref 0.57–1.00)
Chloride: 105 mmol/L (ref 96–106)
GFR calc Af Amer: 47 mL/min/{1.73_m2} — ABNORMAL LOW (ref >59)
GFR, EST NON AFRICAN AMERICAN: 41 mL/min/{1.73_m2} — AB (ref >59)
GLOBULIN, TOTAL: 2.4 g/dL (ref 1.5–4.5)
GLUCOSE: 171 mg/dL — AB (ref 65–99)
Potassium: 4.4 mmol/L (ref 3.5–5.2)
SODIUM: 141 mmol/L (ref 134–144)
Total Protein: 6.7 g/dL (ref 6.0–8.5)

## 2018-09-14 LAB — HIV ANTIBODY (ROUTINE TESTING W REFLEX): HIV Screen 4th Generation wRfx: NONREACTIVE

## 2018-09-23 ENCOUNTER — Telehealth: Payer: Self-pay

## 2018-09-23 NOTE — Telephone Encounter (Signed)
-----   Message from Hoy Register, MD sent at 09/15/2018  8:41 AM EDT ----- Labs are stable

## 2018-09-23 NOTE — Telephone Encounter (Signed)
Patient was called and informed of lab results via [phone])

## 2018-10-10 ENCOUNTER — Other Ambulatory Visit: Payer: Self-pay | Admitting: Family Medicine

## 2018-10-10 DIAGNOSIS — E118 Type 2 diabetes mellitus with unspecified complications: Secondary | ICD-10-CM

## 2018-10-10 DIAGNOSIS — E1149 Type 2 diabetes mellitus with other diabetic neurological complication: Secondary | ICD-10-CM

## 2018-10-10 DIAGNOSIS — E7800 Pure hypercholesterolemia, unspecified: Secondary | ICD-10-CM

## 2018-10-10 DIAGNOSIS — Z794 Long term (current) use of insulin: Principal | ICD-10-CM

## 2018-10-10 MED FILL — LOSARTAN POTASSIUM 100 MG T: 100 | 30 days supply | Qty: 30 | Fill #2

## 2018-10-10 MED FILL — AMLODIPINE BESYLATE 10 MG T: 10 | 30 days supply | Qty: 30 | Fill #0

## 2018-10-10 MED FILL — FLUoxetine HCL 20 MG TABS: 20 | 30 days supply | Qty: 60 | Fill #2

## 2018-10-10 MED FILL — CARVEDILOL 25 MG TABLET: 25 | 30 days supply | Qty: 60 | Fill #2

## 2018-10-10 MED FILL — TRUEPLUS PEN NDL 31G X 1/4: 31G X 6 MM | 50 days supply | Qty: 100 | Fill #0

## 2018-10-10 MED FILL — TRUEPLUS PEN NDL 31G X 1/4": 31G X 6 MM | 50 days supply | Qty: 100 | Fill #0

## 2018-10-10 MED FILL — ATORVASTATIN CALCIUM 40 MG: 40 | 30 days supply | Qty: 30 | Fill #0

## 2018-10-10 MED FILL — GLIMEPIRIDE 4 MG TABLET: 4 | 30 days supply | Qty: 30 | Fill #2

## 2018-10-10 MED FILL — ISOSORBIDE MN ER 60 MG TAB: 60 | 30 days supply | Qty: 30 | Fill #1

## 2018-10-11 MED FILL — GABAPENTIN 300 MG CAPSULE: 300 | 30 days supply | Qty: 90 | Fill #0

## 2018-11-16 MED FILL — LOSARTAN POTASSIUM 100 MG T: 100 | 30 days supply | Qty: 30 | Fill #0

## 2018-11-16 MED FILL — ATORVASTATIN CALCIUM 40 MG: 40 | 30 days supply | Qty: 30 | Fill #1

## 2018-11-16 MED FILL — ISOSORBIDE MN ER 60 MG TAB: 60 | 30 days supply | Qty: 30 | Fill #2

## 2018-11-16 MED FILL — GABAPENTIN 300 MG CAPSULE: 300 | 30 days supply | Qty: 90 | Fill #1

## 2018-11-16 MED FILL — CARVEDILOL 25 MG TABLET: 25 | 30 days supply | Qty: 60 | Fill #0

## 2018-11-16 MED FILL — GLIMEPIRIDE 4 MG TABS: 4 | 30 days supply | Qty: 30 | Fill #0

## 2018-11-16 MED FILL — FLUoxetine HCL 20 MG TABS: 20 | 30 days supply | Qty: 60 | Fill #0

## 2018-11-16 MED FILL — AMLODIPINE BESYLATE 10 MG T: 10 | 30 days supply | Qty: 30 | Fill #1

## 2018-11-18 MED FILL — $HUMULIN 70/30 KWIKPEN: (70-30) 100 | 30 days supply | Qty: 15 | Fill #1

## 2018-11-22 ENCOUNTER — Ambulatory Visit: Payer: Self-pay | Attending: Family Medicine

## 2018-12-13 ENCOUNTER — Ambulatory Visit: Payer: Self-pay | Attending: Family Medicine | Admitting: Family Medicine

## 2018-12-13 ENCOUNTER — Encounter: Payer: Self-pay | Admitting: Family Medicine

## 2018-12-13 VITALS — BP 161/82 | HR 68 | Temp 97.5°F | Ht 62.0 in | Wt 148.6 lb

## 2018-12-13 DIAGNOSIS — Z7982 Long term (current) use of aspirin: Secondary | ICD-10-CM | POA: Insufficient documentation

## 2018-12-13 DIAGNOSIS — I10 Essential (primary) hypertension: Secondary | ICD-10-CM | POA: Insufficient documentation

## 2018-12-13 DIAGNOSIS — Z79899 Other long term (current) drug therapy: Secondary | ICD-10-CM | POA: Insufficient documentation

## 2018-12-13 DIAGNOSIS — E1149 Type 2 diabetes mellitus with other diabetic neurological complication: Secondary | ICD-10-CM | POA: Insufficient documentation

## 2018-12-13 DIAGNOSIS — E118 Type 2 diabetes mellitus with unspecified complications: Secondary | ICD-10-CM

## 2018-12-13 DIAGNOSIS — F331 Major depressive disorder, recurrent, moderate: Secondary | ICD-10-CM | POA: Insufficient documentation

## 2018-12-13 DIAGNOSIS — E1151 Type 2 diabetes mellitus with diabetic peripheral angiopathy without gangrene: Secondary | ICD-10-CM | POA: Insufficient documentation

## 2018-12-13 DIAGNOSIS — Z794 Long term (current) use of insulin: Secondary | ICD-10-CM | POA: Insufficient documentation

## 2018-12-13 DIAGNOSIS — R0989 Other specified symptoms and signs involving the circulatory and respiratory systems: Secondary | ICD-10-CM | POA: Insufficient documentation

## 2018-12-13 DIAGNOSIS — E785 Hyperlipidemia, unspecified: Secondary | ICD-10-CM | POA: Insufficient documentation

## 2018-12-13 DIAGNOSIS — E7800 Pure hypercholesterolemia, unspecified: Secondary | ICD-10-CM | POA: Insufficient documentation

## 2018-12-13 LAB — POCT GLYCOSYLATED HEMOGLOBIN (HGB A1C): Hemoglobin A1C: 8.9 % — AB (ref 4.0–5.6)

## 2018-12-13 LAB — GLUCOSE, POCT (MANUAL RESULT ENTRY): POC Glucose: 144 mg/dl — AB (ref 70–99)

## 2018-12-13 MED ORDER — AMLODIPINE BESYLATE 10 MG PO TABS: 10.0000 mg | ORAL_TABLET | Freq: Every day | ORAL | 1 refills | Status: DC

## 2018-12-13 MED ORDER — GLIMEPIRIDE 4 MG PO TABS: 4.0000 mg | ORAL_TABLET | Freq: Every day | ORAL | 1 refills | Status: DC

## 2018-12-13 MED ORDER — CARVEDILOL 25 MG PO TABS: 25.0000 mg | ORAL_TABLET | Freq: Two times a day (BID) | ORAL | 1 refills | Status: DC

## 2018-12-13 MED ORDER — INSULIN ISOPHANE & REGULAR (HUMAN 70-30)100 UNIT/ML KWIKPEN: PEN_INJECTOR | SUBCUTANEOUS | 3 refills | Status: DC

## 2018-12-13 MED ORDER — ATORVASTATIN CALCIUM 40 MG PO TABS: 40.0000 mg | ORAL_TABLET | Freq: Every day | ORAL | 1 refills | Status: DC

## 2018-12-13 MED ORDER — FLUOXETINE HCL 20 MG PO TABS: 40.0000 mg | ORAL_TABLET | Freq: Every day | ORAL | 1 refills | Status: DC

## 2018-12-13 MED ORDER — GABAPENTIN 300 MG PO CAPS: ORAL_CAPSULE | ORAL | 1 refills | Status: DC

## 2018-12-13 MED ORDER — ISOSORBIDE MONONITRATE ER 60 MG PO TB24: 60.0000 mg | ORAL_TABLET | Freq: Every day | ORAL | 1 refills | Status: DC

## 2018-12-13 MED ORDER — LOSARTAN POTASSIUM 100 MG PO TABS: ORAL_TABLET | ORAL | 1 refills | Status: DC

## 2018-12-13 MED FILL — ISOSORBIDE MN ER 60 MG TAB: 60 | 30 days supply | Qty: 30 | Fill #0

## 2018-12-13 MED FILL — CARVEDILOL 25 MG TABLET: 25 | 30 days supply | Qty: 60 | Fill #0

## 2018-12-13 MED FILL — ATORVASTATIN CALCIUM 40 MG: 40 | 30 days supply | Qty: 30 | Fill #0

## 2018-12-13 MED FILL — AMLODIPINE BESYLATE 10 MG T: 10 | 30 days supply | Qty: 30 | Fill #0

## 2018-12-13 MED FILL — ?GLIMEPIRIDE 4 MG TABLET: 4 | 30 days supply | Qty: 30 | Fill #0

## 2018-12-13 MED FILL — GABAPENTIN 300 MG CAPSULE: 300 | 30 days supply | Qty: 90 | Fill #0

## 2018-12-13 MED FILL — $HUMULIN 70/30 KWIKPEN: (70-30) 100 | 23 days supply | Qty: 30 | Fill #0

## 2018-12-13 MED FILL — LOSARTAN POTASSIUM 100 MG T: 100 | 30 days supply | Qty: 30 | Fill #0

## 2018-12-13 MED FILL — FLUoxetine HCL 20 MG TABS: 20 | 30 days supply | Qty: 60 | Fill #0

## 2018-12-13 NOTE — Progress Notes (Signed)
Subjective:  Patient ID: Samantha [name], female    DOB: [birth year]  Age: 67 y.o. MRN: [id number]  CC: Diabetes   HPI Samantha [name]  is a 67 year old female with a history of hypertension, type 2 diabetes mellitus (A1c 8.7), hyperlipidemia, depression, peripheral vascular disease who comes into the clinic for a follow-up visit. Her A1c is 8.9 and she endorses using Humulin 70/30, 35 units in the morning and 25 units in the evening and her highest sugar has been 230.  She denies hypoglycemia, numbness in extremities or visual concerns.  Not up-to-date on annual eye exam due to lack of medical coverage. Her blood pressure is elevated and she is yet to take her antihypertensive today. Her depression is controlled.  Her depression is controlled.  She is accompanied by her daughter and has no additional concerns today.  Past Medical History:  Diagnosis Date  . Acute pyelonephritis   . Bacteremia, escherichia coli 01/12/2015  . Chest pain   . Critical lower limb ischemia   . Hyperlipidemia   . Hypertension   . Left carotid bruit   . PAD (peripheral artery disease) (HCC)   . Type II diabetes mellitus (HCC)     Past Surgical History:  Procedure Laterality Date  . ABDOMINAL ANGIOGRAM  05/16/2015   Procedure: Abdominal Angiogram;  Surgeon: Runell GessJonathan J Berry, MD;  [facility];  Service: Cardiovascular;;  . ABDOMINAL HYSTERECTOMY  ~ 2000  . PERIPHERAL VASCULAR CATHETERIZATION Bilateral 05/16/2015   Procedure: Lower Extremity Angiography;  Surgeon: Runell GessJonathan J Berry, MD; renal arteries widely patent, L-SFA 75%, L-pop 95%, L-peroneal 90%; R-SFA 40%, R-pop 50%, R-prox peroneal 90%; directional atherectomy L-SFA, p-pop, drug-eluting balloon angioplasty reducing the stenoses to 0, small linear dissection was not flow limiting      . PERIPHERAL VASCULAR CATHETERIZATION Left 05/16/2015   Procedure: Peripheral Vascular Atherectomy;  Surgeon: Runell GessJonathan J Berry, MD;  [facility];  Service: Cardiovascular;  Laterality: Left;  sfa    No Known Allergies   Outpatient Medications Prior to Visit  Medication Sig Dispense Refill  . aspirin 81 MG EC tablet Take 1 tablet (81 mg total) by mouth daily. 30 tablet 3  . Blood Glucose Monitoring Suppl (TRUE METRIX METER) DEVI 1 each by Does not apply route 3 (three) times daily before meals. 1 Device 0  . glucose blood test strip Use 3 times daily before meals 100 each 12  . Insulin Syringe-Needle U-100 (BD INSULIN SYRINGE ULTRAFINE) 31G X 15/64" 0.5 ML MISC Use subcutaneously twice daily 60 each 12  . TRUEPLUS LANCETS 28G MISC 1 each by Does not apply route 3 (three) times daily before meals. 100 each 12  . TRUEPLUS PEN NEEDLES 31G X 6 MM MISC USE AS DIRECTED 2 TIMES DAILY AT 8 AM AND 10 PM 100 each 12  . amLODipine (NORVASC) 10 MG tablet Take 1 tablet (10 mg total) by mouth daily. 90 tablet 1  . atorvastatin (LIPITOR) 40 MG tablet TAKE 1 TABLET BY MOUTH DAILY. 90 tablet 0  . carvedilol (COREG) 25 MG tablet Take 1 tablet (25 mg total) by mouth 2 (two) times daily with a meal. 180 tablet 1  . FLUoxetine (PROZAC) 20 MG tablet Take 2 tablets (40 mg total) by mouth daily. 180 tablet 1  . gabapentin (NEURONTIN) 300 MG capsule TAKE 1 CAPSULE BY MOUTH 3 TIMES DAILY. 90 capsule 1  . glimepiride (AMARYL) 4 MG tablet Take 1 tablet (4 mg total) by mouth daily  with breakfast. 90 tablet 1  . Insulin Isophane & Regular Human (HUMULIN 70/30 KWIKPEN) (70-30) 100 UNIT/ML PEN Inject subcutaneously twice daily 30 units in the morning and 20 units in the evening 30 mL 3  . isosorbide mononitrate (IMDUR) 60 MG 24 hr tablet Take 1 tablet (60 mg total) by mouth daily. 90 tablet 1  . losartan (COZAAR) 100 MG tablet TAKE 1 TABLET BY MOUTH DAILY. 90 tablet 1  . ibuprofen (ADVIL,MOTRIN) 200 MG tablet Take 400 mg by mouth every 6 (six) hours as needed (pain/fever). Reported on 06/10/2016    . metFORMIN (GLUCOPHAGE) 1000 MG tablet Take 1 tablet  (1,000 mg total) by mouth 2 (two) times daily with a meal. (Patient not taking: Reported on 12/13/2018) 180 tablet 1   No facility-administered medications prior to visit.     ROS Review of Systems  Constitutional: Negative for activity change, appetite change and fatigue.  HENT: Negative for congestion, sinus pressure and sore throat.   Eyes: Negative for visual disturbance.  Respiratory: Negative for cough, chest tightness, shortness of breath and wheezing.   Cardiovascular: Negative for chest pain and palpitations.  Gastrointestinal: Negative for abdominal distention, abdominal pain and constipation.  Endocrine: Negative for polydipsia.  Genitourinary: Negative for dysuria and frequency.  Musculoskeletal: Negative for arthralgias and back pain.  Skin: Negative for rash.  Neurological: Negative for tremors, light-headedness and numbness.  Hematological: Does not bruise/bleed easily.  Psychiatric/Behavioral: Negative for agitation and behavioral problems.    Objective:  BP (!) 161/82   Pulse 68   Temp (!) 97.5 F (36.4 C) (Oral)   Ht 5\' 2"  (1.575 m)   Wt 148 lb 9.6 oz (67.4 kg)   SpO2 98%   BMI 27.18 kg/m   BP/Weight 12/13/2018 09/13/2018 06/06/2018  Systolic BP 161 137 168  Diastolic BP 82 77 90  Wt. (Lbs) 148.6 148.2 143.8  BMI 27.18 27.11 26.3      Physical Exam Constitutional:      Appearance: She is well-developed.  Cardiovascular:     Rate and Rhythm: Normal rate.     Heart sounds: Normal heart sounds. No murmur.  Pulmonary:     Effort: Pulmonary effort is normal.     Breath sounds: Normal breath sounds. No wheezing or rales.  Chest:     Chest wall: No tenderness.  Abdominal:     General: Bowel sounds are normal. There is no distension.     Palpations: Abdomen is soft. There is no mass.     Tenderness: There is no abdominal tenderness.  Musculoskeletal: Normal range of motion.  Neurological:     Mental Status: She is alert and oriented to person, place,  and time.  Psychiatric:        Mood and Affect: Mood normal.        Behavior: Behavior normal.       CMP Latest Ref Rng & Units 09/13/2018 02/23/2018 03/24/2017  Glucose 65 - 99 mg/dL 161(W) 960(A) 540(J)  BUN 8 - 27 mg/dL 19 17 23   Creatinine 0.57 - 1.00 mg/dL 8.11(B) 1.47(W) 2.95(A)  Sodium 134 - 144 mmol/L 141 137 140  Potassium 3.5 - 5.2 mmol/L 4.4 4.4 4.6  Chloride 96 - 106 mmol/L 105 102 101  CO2 20 - 29 mmol/L 18(L) 19(L) 22  Calcium 8.7 - 10.3 mg/dL 9.3 9.4 9.5  Total Protein 6.0 - 8.5 g/dL 6.7 6.9 6.9  Total Bilirubin 0.0 - 1.2 mg/dL 0.4 0.3 0.3  Alkaline Phos 39 - 117 IU/L  112 109 91  AST 0 - 40 IU/L 22 15 21   ALT 0 - 32 IU/L 18 17 21     Lipid Panel     Component Value Date/Time   CHOL 139 09/13/2018 1002   TRIG 102 09/13/2018 1002   HDL 45 09/13/2018 1002   CHOLHDL 3.1 09/13/2018 1002   CHOLHDL 3.2 02/12/2016 1707   VLDL 27 02/12/2016 1707   LDLCALC 74 09/13/2018 1002    Lab Results  Component Value Date   HGBA1C 8.9 (A) 12/13/2018     Assessment & Plan:   1. Type 2 diabetes mellitus with complication, with long-term current use of insulin (HCC) Uncontrolled with A1c of 8.9 Increased morning dose of Humulin 70/30 Diabetic diet - POCT glucose (manual entry) - POCT glycosylated hemoglobin (Hb A1C) - Insulin Isophane & Regular Human (HUMULIN 70/30 KWIKPEN) (70-30) 100 UNIT/ML PEN; Inject subcutaneously twice daily 40 units in the morning and 25 units in the evening  Dispense: 30 mL; Refill: 3 - glimepiride (AMARYL) 4 MG tablet; Take 1 tablet (4 mg total) by mouth daily with breakfast.  Dispense: 90 tablet; Refill: 1  2. Type II diabetes mellitus with neurological manifestations (HCC) Stable - gabapentin (NEURONTIN) 300 MG capsule; TAKE 1 CAPSULE BY MOUTH 3 TIMES DAILY.  Dispense: 90 capsule; Refill: 1  3. Essential hypertension Uncontrolled due to not taking antihypertensive Compliance emphasized Low-sodium diet - amLODipine (NORVASC) 10 MG tablet;  Take 1 tablet (10 mg total) by mouth daily.  Dispense: 90 tablet; Refill: 1 - carvedilol (COREG) 25 MG tablet; Take 1 tablet (25 mg total) by mouth 2 (two) times daily with a meal.  Dispense: 180 tablet; Refill: 1 - isosorbide mononitrate (IMDUR) 60 MG 24 hr tablet; Take 1 tablet (60 mg total) by mouth daily.  Dispense: 90 tablet; Refill: 1 - losartan (COZAAR) 100 MG tablet; TAKE 1 TABLET BY MOUTH DAILY.  Dispense: 90 tablet; Refill: 1  4. Pure hypercholesterolemia Controlled Low-cholesterol diet - atorvastatin (LIPITOR) 40 MG tablet; Take 1 tablet (40 mg total) by mouth daily.  Dispense: 90 tablet; Refill: 1  5. Moderate episode of recurrent major depressive disorder (HCC) Stable - FLUoxetine (PROZAC) 20 MG tablet; Take 2 tablets (40 mg total) by mouth daily.  Dispense: 180 tablet; Refill: 1   Meds ordered this encounter  Medications  . Insulin Isophane & Regular Human (HUMULIN 70/30 KWIKPEN) (70-30) 100 UNIT/ML PEN    Sig: Inject subcutaneously twice daily 40 units in the morning and 25 units in the evening    Dispense:  30 mL    Refill:  3    Discontinue previous dose  . gabapentin (NEURONTIN) 300 MG capsule    Sig: TAKE 1 CAPSULE BY MOUTH 3 TIMES DAILY.    Dispense:  90 capsule    Refill:  1  . amLODipine (NORVASC) 10 MG tablet    Sig: Take 1 tablet (10 mg total) by mouth daily.    Dispense:  90 tablet    Refill:  1  . atorvastatin (LIPITOR) 40 MG tablet    Sig: Take 1 tablet (40 mg total) by mouth daily.    Dispense:  90 tablet    Refill:  1  . carvedilol (COREG) 25 MG tablet    Sig: Take 1 tablet (25 mg total) by mouth 2 (two) times daily with a meal.    Dispense:  180 tablet    Refill:  1  . FLUoxetine (PROZAC) 20 MG tablet    Sig: Take 2  tablets (40 mg total) by mouth daily.    Dispense:  180 tablet    Refill:  1  . glimepiride (AMARYL) 4 MG tablet    Sig: Take 1 tablet (4 mg total) by mouth daily with breakfast.    Dispense:  90 tablet    Refill:  1  .  isosorbide mononitrate (IMDUR) 60 MG 24 hr tablet    Sig: Take 1 tablet (60 mg total) by mouth daily.    Dispense:  90 tablet    Refill:  1  . losartan (COZAAR) 100 MG tablet    Sig: TAKE 1 TABLET BY MOUTH DAILY.    Dispense:  90 tablet    Refill:  1    Follow-up: Return in about 1 month (around 01/13/2019) for Complete physical exam.   Hoy Register MD

## 2019-01-23 ENCOUNTER — Encounter: Payer: Self-pay | Admitting: Family Medicine

## 2019-01-23 ENCOUNTER — Ambulatory Visit: Payer: Self-pay | Attending: Family Medicine | Admitting: Family Medicine

## 2019-01-23 VITALS — BP 160/72 | HR 65 | Temp 98.2°F | Ht 62.0 in | Wt 152.2 lb

## 2019-01-23 DIAGNOSIS — Z794 Long term (current) use of insulin: Secondary | ICD-10-CM

## 2019-01-23 DIAGNOSIS — E2839 Other primary ovarian failure: Secondary | ICD-10-CM

## 2019-01-23 DIAGNOSIS — Z1239 Encounter for other screening for malignant neoplasm of breast: Secondary | ICD-10-CM

## 2019-01-23 DIAGNOSIS — Z1211 Encounter for screening for malignant neoplasm of colon: Secondary | ICD-10-CM

## 2019-01-23 DIAGNOSIS — E118 Type 2 diabetes mellitus with unspecified complications: Secondary | ICD-10-CM

## 2019-01-23 DIAGNOSIS — Z0000 Encounter for general adult medical examination without abnormal findings: Secondary | ICD-10-CM

## 2019-01-23 LAB — GLUCOSE, POCT (MANUAL RESULT ENTRY): POC Glucose: 157 mg/dl — AB (ref 70–99)

## 2019-01-23 MED FILL — AMLODIPINE BESYLATE 10 MG T: 10 | 30 days supply | Qty: 30 | Fill #1

## 2019-01-23 MED FILL — TRUEPLUS PEN NDL 31G X 1/4: 31G X 6 MM | 50 days supply | Qty: 100 | Fill #1

## 2019-01-23 MED FILL — GABAPENTIN 300 MG CAPSULE: 300 | 30 days supply | Qty: 90 | Fill #1

## 2019-01-23 MED FILL — ?GLIMEPIRIDE 4 MG TABLET: 4 | 30 days supply | Qty: 30 | Fill #1

## 2019-01-23 MED FILL — CARVEDILOL 25 MG TABLET: 25 | 30 days supply | Qty: 60 | Fill #1

## 2019-01-23 MED FILL — FLUoxetine HCL 20 MG TABS: 20 | 30 days supply | Qty: 60 | Fill #1

## 2019-01-23 MED FILL — TRUE METRIX TEST STRIP: 30 days supply | Qty: 100 | Fill #2

## 2019-01-23 MED FILL — ATORVASTATIN CALCIUM 40 MG: 40 | 30 days supply | Qty: 30 | Fill #1

## 2019-01-23 MED FILL — $HUMULIN 70/30 KWIKPEN: (70-30) 100 | 32 days supply | Qty: 21 | Fill #1

## 2019-01-23 MED FILL — ISOSORBIDE MN ER 60 MG TAB: 60 | 30 days supply | Qty: 30 | Fill #1

## 2019-01-23 MED FILL — TRUEPLUS PEN NDL 31G X 1/4": 31G X 6 MM | 50 days supply | Qty: 100 | Fill #1

## 2019-01-23 MED FILL — LOSARTAN POTASSIUM 100 MG T: 100 | 30 days supply | Qty: 30 | Fill #1

## 2019-01-23 NOTE — Progress Notes (Signed)
Subjective:  Patient ID: Samantha [name], female    DOB: Dec 15, 1951  Age: 68 y.o. MRN: [id number]  CC: Annual Exam   HPI [name] presents for complete physical exam. She has no additional concerns today  Past Medical History:  Diagnosis Date  . Acute pyelonephritis   . Bacteremia, escherichia coli 01/12/2015  . Chest pain   . Critical lower limb ischemia   . Hyperlipidemia   . Hypertension   . Left carotid bruit   . PAD (peripheral artery disease) (HCC)   . Type II diabetes mellitus (HCC)     Past Surgical History:  Procedure Laterality Date  . ABDOMINAL ANGIOGRAM  05/16/2015   Procedure: Abdominal Angiogram;  Surgeon: Runell Gess, MD;  [facility] INVASIVE CV LAB;  Service: Cardiovascular;;  . ABDOMINAL HYSTERECTOMY  ~ 2000  . PERIPHERAL VASCULAR CATHETERIZATION Bilateral 05/16/2015   Procedure: Lower Extremity Angiography;  Surgeon: Runell Gess, MD; renal arteries widely patent, L-SFA 75%, L-pop 95%, L-peroneal 90%; R-SFA 40%, R-pop 50%, R-prox peroneal 90%; directional atherectomy L-SFA, p-pop, drug-eluting balloon angioplasty reducing the stenoses to 0, small linear dissection was not flow limiting      . PERIPHERAL VASCULAR CATHETERIZATION Left 05/16/2015   Procedure: Peripheral Vascular Atherectomy;  Surgeon: Runell Gess, MD;  Location: [facility] INVASIVE CV LAB;  Service: Cardiovascular;  Laterality: Left;  sfa    No Known Allergies   Outpatient Medications Prior to Visit  Medication Sig Dispense Refill  . amLODipine (NORVASC) 10 MG tablet Take 1 tablet (10 mg total) by mouth daily. 90 tablet 1  . aspirin 81 MG EC tablet Take 1 tablet (81 mg total) by mouth daily. 30 tablet 3  . atorvastatin (LIPITOR) 40 MG tablet Take 1 tablet (40 mg total) by mouth daily. 90 tablet 1  . Blood Glucose Monitoring Suppl (TRUE METRIX METER) DEVI 1 each by Does not apply route 3 (three) times daily before meals. 1 Device 0  . carvedilol (COREG) 25 MG tablet Take 1  tablet (25 mg total) by mouth 2 (two) times daily with a meal. 180 tablet 1  . FLUoxetine (PROZAC) 20 MG tablet Take 2 tablets (40 mg total) by mouth daily. 180 tablet 1  . gabapentin (NEURONTIN) 300 MG capsule TAKE 1 CAPSULE BY MOUTH 3 TIMES DAILY. 90 capsule 1  . glimepiride (AMARYL) 4 MG tablet Take 1 tablet (4 mg total) by mouth daily with breakfast. 90 tablet 1  . glucose blood test strip Use 3 times daily before meals 100 each 12  . ibuprofen (ADVIL,MOTRIN) 200 MG tablet Take 400 mg by mouth every 6 (six) hours as needed (pain/fever). Reported on 06/10/2016    . Insulin Isophane & Regular Human (HUMULIN 70/30 KWIKPEN) (70-30) 100 UNIT/ML PEN Inject subcutaneously twice daily 40 units in the morning and 25 units in the evening 30 mL 3  . Insulin Syringe-Needle U-100 (BD INSULIN SYRINGE ULTRAFINE) 31G X 15/64" 0.5 ML MISC Use subcutaneously twice daily 60 each 12  . isosorbide mononitrate (IMDUR) 60 MG 24 hr tablet Take 1 tablet (60 mg total) by mouth daily. 90 tablet 1  . losartan (COZAAR) 100 MG tablet TAKE 1 TABLET BY MOUTH DAILY. 90 tablet 1  . metFORMIN (GLUCOPHAGE) 1000 MG tablet Take 1 tablet (1,000 mg total) by mouth 2 (two) times daily with a meal. 180 tablet 1  . TRUEPLUS LANCETS 28G MISC 1 each by Does not apply route 3 (three) times daily before meals. 100 each 12  . TRUEPLUS  PEN NEEDLES 31G X 6 MM MISC USE AS DIRECTED 2 TIMES DAILY AT 8 AM AND 10 PM 100 each 12   No facility-administered medications prior to visit.     ROS Review of Systems  Constitutional: Negative for activity change, appetite change and fatigue.  HENT: Negative for congestion, sinus pressure and sore throat.   Eyes: Negative for visual disturbance.  Respiratory: Negative for cough, chest tightness, shortness of breath and wheezing.   Cardiovascular: Negative for chest pain and palpitations.  Gastrointestinal: Negative for abdominal distention, abdominal pain and constipation.  Endocrine: Negative for  polydipsia.  Genitourinary: Negative for dysuria and frequency.  Musculoskeletal: Negative for arthralgias and back pain.  Skin: Negative for rash.  Neurological: Negative for tremors, light-headedness and numbness.  Hematological: Does not bruise/bleed easily.  Psychiatric/Behavioral: Negative for agitation and behavioral problems.    Objective:  BP (!) 160/72   Pulse 65   Temp 98.2 F (36.8 C) (Oral)   Ht 5\' 2"  (1.575 m)   Wt 152 lb 3.2 oz (69 kg)   SpO2 99%   BMI 27.84 kg/m   BP/Weight 01/23/2019 12/13/2018 09/13/2018  Systolic BP 160 161 137  Diastolic BP 72 82 77  Wt. (Lbs) 152.2 148.6 148.2  BMI 27.84 27.18 27.11      Physical Exam Constitutional:      General: She is not in acute distress.    Appearance: She is well-developed. She is not diaphoretic.  HENT:     Head: Normocephalic.     Right Ear: External ear normal.     Left Ear: External ear normal.     Nose: Nose normal.  Eyes:     Conjunctiva/sclera: Conjunctivae normal.     Pupils: Pupils are equal, round, and reactive to light.  Neck:     Musculoskeletal: Normal range of motion.     Vascular: No JVD.  Cardiovascular:     Rate and Rhythm: Normal rate and regular rhythm.     Heart sounds: Normal heart sounds. No murmur. No gallop.   Pulmonary:     Effort: Pulmonary effort is normal. No respiratory distress.     Breath sounds: Normal breath sounds. No wheezing or rales.  Chest:     Chest wall: No tenderness.     Breasts:        Right: Normal. No mass.        Left: Normal. No mass.  Abdominal:     General: Bowel sounds are normal. There is no distension.     Palpations: Abdomen is soft. There is no mass.     Tenderness: There is no abdominal tenderness.  Musculoskeletal: Normal range of motion.        General: No tenderness.  Skin:    General: Skin is warm and dry.  Neurological:     Mental Status: She is alert and oriented to person, place, and time.     Deep Tendon Reflexes: Reflexes are  normal and symmetric.  Psychiatric:        Mood and Affect: Mood normal.        Behavior: Behavior normal.     Lab Results  Component Value Date   HGBA1C 8.9 (A) 12/13/2018    Assessment & Plan:   1. Type 2 diabetes mellitus with complication, with long-term current use of insulin (HCC) Uncontrolled with A1c of 8.9 Addressed at last office visit - POCT glucose (manual entry)  2. Annual physical exam Counseled on 150 minutes of exercise per week, healthy  eating (including decreased daily intake of saturated fats, cholesterol, added sugars, sodium), STI prevention, routine healthcare maintenance.   3. Screening for breast cancer - MM Digital Screening; Future  4. Screening for colon cancer - Ambulatory referral to Gastroenterology  5. Estrogen deficiency - DG Bone Density; Future   No orders of the defined types were placed in this encounter.   Follow-up: Return in about 3 months (around 04/24/2019) for follow up of chronic medical conditions.   Hoy RegisterEnobong Soraida Vickers MD

## 2019-01-23 NOTE — Patient Instructions (Signed)
Mantenimiento de la salud despus de los 65 aos de edad Health Maintenance After Age 65 Despus de los 65 aos de edad, corre un riesgo mayor de padecer ciertas enfermedades e infecciones a largo plazo, como tambin de sufrir lesiones por cadas. Las cadas son la causa principal de las fracturas de huesos y lesiones en la cabeza de personas mayores de 65 aos de edad. Recibir cuidados preventivos de forma regular puede ayudarlo a mantenerse saludable y en buen estado. Los cuidados preventivos incluyen realizarse anlisis de forma regular y realizar cambios en el estilo de vida segn las recomendaciones del mdico. Converse con el profesional que lo asiste sobre:  Las pruebas de deteccin y los anlisis que debe realizarse. Una prueba de deteccin es un estudio que se para detectar la presencia de una enfermedad cuando no tiene sntomas.  Un plan de dieta y ejercicios adecuado para usted. Qu debo saber sobre las pruebas de deteccin y los anlisis para prevenir cadas? Realizarse pruebas de deteccin y anlisis es la mejor manera de detectar un problema de salud de forma temprana. El diagnstico y tratamiento tempranos le brindan la mejor oportunidad de controlar las afecciones mdicas que son comunes despus de los 65 aos de edad. Ciertas afecciones y elecciones de estilo de vida pueden hacer que sea ms propenso a sufrir una cada. El mdico puede recomendarle lo siguiente:  Controles regulares de la visin. Una visin deficiente y afecciones como las cataratas pueden hacer que sea ms propenso a sufrir una cada. Si usa lentes, asegrese de obtener una receta actualizada si su visin cambia.  Revisin de medicamentos. Revise regularmente con el mdico todos los medicamentos que toma, incluidos los medicamentos de venta libre. Consulte al mdico sobre los efectos secundarios que pueden hacer que sea ms propenso a sufrir una cada. Informe al mdico si alguno de los medicamentos que toma lo hace  sentir mareado o somnoliento.  Pruebas de deteccin para la osteoporosis. La osteoporosis es una afeccin que hace que los huesos se vuelvan ms frgiles. En consecuencia, los huesos pueden debilitarse y quebrarse ms fcilmente.  Pruebas de deteccin para la presin arterial. Los cambios en la presin arterial y los medicamentos para controlar la presin arterial pueden hacerlo sentir mareado.  Controles de fuerza y equilibrio. El mdico puede recomendar ciertos estudios para controlar su fuerza y equilibrio al estar de pie, al caminar o al cambiar de posicin.  Examen de los pies. El dolor y el adormecimiento en los pies, como tambin no utilizar el calzado adecuado, pueden hacer que sea ms propenso a sufrir una cada.  Prueba de deteccin de la depresin. Es ms probable que sufra una cada si tiene miedo a caerse, se siente mal emocionalmente o se siente incapaz de realizar actividades que sola hacer.  Prueba de deteccin de consumo de alcohol. Beber demasiado alcohol puede afectar su equilibrio y puede hacer que sea ms propenso a sufrir una cada. Qu medidas puedo tomar para reducir mi riesgo de sufrir una cada? Instrucciones generales  Hable con el mdico sobre sus riesgos de sufrir una cada. Infrmele a su mdico si: ? Se cae. Asegrese de informarle a su mdico acerca de todas las cadas, incluso aquellas que parecen ser menores. ? Se siente mareado, somnoliento o que pierde el equilibrio.  Tome los medicamentos de venta libre y los recetados solamente como se lo haya indicado el mdico. Estos incluyen todos los suplementos.  Siga una dieta sana y mantenga un peso saludable. Una dieta saludable incluye   productos lcteos descremados, carnes bajas en contenido de grasa (magras, fibra de granos enteros, frijoles y muchas frutas y verduras. La seguridad en el hogar  Retire los objetos que puedan causar tropiezos tales como alfombras, cables u obstculos.  Instale equipos de  seguridad, como barras para sostn en los baos y barandas de seguridad en las escaleras.  Mantenga las habitaciones y los pasillos bien iluminados. Actividad   Siga un programa de ejercicio regular para mantenerse en forma. Esto lo ayudar a mantener el equilibrio. Consulte al mdico qu tipos de ejercicios son adecuados para usted.  Si necesita un bastn o un andador, selo segn las recomendaciones del mdico.  Utilice calzado con buen apoyo y suela antideslizante. Estilo de vida  No beba alcohol si el mdico le indica que no beba.  Si bebe alcohol, limite la cantidad que consume: ? De 0 a 1 medida por da para las mujeres. ? De 0 a 2 medidas por da para los hombres.  Est atento a la cantidad de alcohol que contiene su bebida. [place] una medida equivale a una botella tpica de cerveza (12 onzas), media copa de vino (5 onzas) o una medida de bebida blanca (1 onza).  No consuma ningn producto que contenga nicotina o tabaco, como cigarrillos y cigarrillos electrnicos. Si necesita ayuda para dejar de fumar, consulte al mdico. Resumen  Tener un estilo de vida saludable y recibir cuidados preventivos pueden ayudar a promover la salud y el bienestar despus de los 65 aos de edad.  Realizarse pruebas de deteccin y anlisis es la mejor manera de detectar un problema de salud de forma temprana y ayudarlo a evitar una cada. El diagnstico y tratamiento tempranos le brindan la mejor oportunidad de controlar las afecciones mdicas ms comunes en las personas mayores de 65 aos de edad.  Las cadas son la causa principal de las fracturas de huesos y lesiones en la cabeza de personas mayores de 65 aos de edad. Tome precauciones para evitar una cada en su casa.  Trabaje con el mdico para saber qu cambios que puede hacer para mejorar su salud y bienestar, y para prevenir las cadas. Esta informacin no tiene como fin reemplazar el consejo del mdico. Asegrese de hacerle al  mdico cualquier pregunta que tenga. Document Released: 01/27/2018 Document Revised: 01/27/2018 Document Reviewed: 01/27/2018 Elsevier Interactive Patient Education  2019 [employer]  

## 2019-01-24 ENCOUNTER — Encounter: Payer: Self-pay | Admitting: Gastroenterology

## 2019-01-30 ENCOUNTER — Other Ambulatory Visit: Payer: Self-pay

## 2019-01-30 MED ORDER — PNEUMOCOCCAL 13-VAL CONJ VACC IM SUSP: 0.5000 mL | INTRAMUSCULAR | 0 refills | Status: DC

## 2019-03-20 MED FILL — LOSARTAN POTASSIUM 100 MG T: 100 | 30 days supply | Qty: 30 | Fill #2

## 2019-03-20 MED FILL — ATORVASTATIN CALCIUM 40 MG: 40 | 30 days supply | Qty: 30 | Fill #2

## 2019-03-20 MED FILL — ISOSORBIDE MN ER 60 MG TAB: 60 | 90 days supply | Qty: 90 | Fill #2

## 2019-03-20 MED FILL — GLIMEPIRIDE 4 MG TABS: 4 | 90 days supply | Qty: 90 | Fill #2

## 2019-03-20 MED FILL — AMLODIPINE BESYLATE 10 MG T: 10 | 30 days supply | Qty: 30 | Fill #2

## 2019-03-20 MED FILL — $HUMULIN 70/30 KWIKPEN: (70-30) 100 | 91 days supply | Qty: 60 | Fill #2

## 2019-03-20 MED FILL — CARVEDILOL 25 MG TABLET: 25 | 30 days supply | Qty: 60 | Fill #2

## 2019-03-20 MED FILL — ?FLUOXETINE HCL 20MG TAB: 20 | 90 days supply | Qty: 180 | Fill #2

## 2019-03-31 ENCOUNTER — Ambulatory Visit: Payer: Self-pay | Admitting: Gastroenterology

## 2019-04-25 ENCOUNTER — Ambulatory Visit: Payer: Self-pay | Admitting: Family Medicine

## 2019-05-23 ENCOUNTER — Other Ambulatory Visit: Payer: Self-pay

## 2019-05-23 ENCOUNTER — Encounter: Payer: Self-pay | Admitting: Family Medicine

## 2019-05-23 ENCOUNTER — Ambulatory Visit: Payer: Self-pay | Attending: Family Medicine | Admitting: Family Medicine

## 2019-05-23 DIAGNOSIS — Z794 Long term (current) use of insulin: Secondary | ICD-10-CM

## 2019-05-23 DIAGNOSIS — E1149 Type 2 diabetes mellitus with other diabetic neurological complication: Secondary | ICD-10-CM

## 2019-05-23 DIAGNOSIS — I10 Essential (primary) hypertension: Secondary | ICD-10-CM

## 2019-05-23 DIAGNOSIS — E7800 Pure hypercholesterolemia, unspecified: Secondary | ICD-10-CM

## 2019-05-23 DIAGNOSIS — E118 Type 2 diabetes mellitus with unspecified complications: Secondary | ICD-10-CM

## 2019-05-23 DIAGNOSIS — F331 Major depressive disorder, recurrent, moderate: Secondary | ICD-10-CM

## 2019-05-23 MED ORDER — GABAPENTIN 300 MG PO CAPS: ORAL_CAPSULE | ORAL | 1 refills | Status: DC

## 2019-05-23 MED ORDER — FLUOXETINE HCL 20 MG PO TABS: 40.0000 mg | ORAL_TABLET | Freq: Every day | ORAL | 1 refills | Status: DC

## 2019-05-23 MED ORDER — AMLODIPINE BESYLATE 10 MG PO TABS: 10.0000 mg | ORAL_TABLET | Freq: Every day | ORAL | 1 refills | Status: DC

## 2019-05-23 MED ORDER — INSULIN ISOPHANE & REGULAR (HUMAN 70-30)100 UNIT/ML KWIKPEN: PEN_INJECTOR | SUBCUTANEOUS | 3 refills | Status: DC

## 2019-05-23 MED ORDER — ISOSORBIDE MONONITRATE ER 60 MG PO TB24: 60.0000 mg | ORAL_TABLET | Freq: Every day | ORAL | 1 refills | Status: DC

## 2019-05-23 MED ORDER — CARVEDILOL 25 MG PO TABS: 25.0000 mg | ORAL_TABLET | Freq: Two times a day (BID) | ORAL | 1 refills | Status: DC

## 2019-05-23 MED ORDER — ATORVASTATIN CALCIUM 40 MG PO TABS: 40.0000 mg | ORAL_TABLET | Freq: Every day | ORAL | 1 refills | Status: DC

## 2019-05-23 MED ORDER — LOSARTAN POTASSIUM 100 MG PO TABS: ORAL_TABLET | ORAL | 1 refills | Status: DC

## 2019-05-23 MED ORDER — METFORMIN HCL 1000 MG PO TABS: 1000.0000 mg | ORAL_TABLET | Freq: Two times a day (BID) | ORAL | 1 refills | Status: DC

## 2019-05-23 MED FILL — ?ATORVASTATIN 40MG TABLET: 40 | 90 days supply | Qty: 90 | Fill #0

## 2019-05-23 MED FILL — ?CARVEDILOL 25 MG TABLET: 25 | 90 days supply | Qty: 180 | Fill #0

## 2019-05-23 MED FILL — !HUMULIN 70/30 KWIKPEN: (70-30) 100 | 32 days supply | Qty: 21 | Fill #0

## 2019-05-23 MED FILL — LOSARTAN POTASSIUM 100 MG T: 100 | 30 days supply | Qty: 30 | Fill #0

## 2019-05-23 MED FILL — ISOSORBIDE MN ER 60 MG TAB: 60 | 30 days supply | Qty: 30 | Fill #0

## 2019-05-23 MED FILL — FLUoxetine HCL 20 MG TABS: 20 | 30 days supply | Qty: 60 | Fill #0

## 2019-05-23 MED FILL — ?AMLODIPINE BESYLATE 10 MG: 10 | 90 days supply | Qty: 90 | Fill #0

## 2019-05-23 MED FILL — metFORMIN HCL 1000 MG TABS: 1000 | 30 days supply | Qty: 60 | Fill #0

## 2019-05-23 MED FILL — GABAPENTIN 300 MG CAPSULE: 300 | 30 days supply | Qty: 90 | Fill #0

## 2019-05-23 NOTE — Progress Notes (Signed)
Virtual Visit via Telephone Note  I connected with [name], on 05/23/2019 at 10:04 AM by telephone due to the COVID-19 pandemic and verified that I am speaking with the correct person using two identifiers.   Consent: I discussed the limitations, risks, security and privacy concerns of performing an evaluation and management service by telephone and the availability of in person appointments. I also discussed with the patient that there may be a patient responsible charge related to this service. The patient expressed understanding and agreed to proceed.   Location of Patient: Home  Location of Provider: Clinic   Persons participating in Telemedicine visit: Nikky Duba DV#[id number] - Interpreter Doloris Hall -CMA Dr Margarita Rana - PCP     History of Present Illness: Samantha [name]  is a 68 year old female with a history of hypertension, type 2 diabetes mellitus (A1c 8.7), hyperlipidemia, depression, peripheral vascular disease who is seen for a follow-up visit today. Her blood pressure has been controlled with systolics in the 371 range and she informs me her highest fasting blood sugar has been 140.  She denies hypoglycemic episodes, numbness in extremities or blurry vision.  Not up-to-date on annual eye exam due to lack of medical coverage. She exercises regularly and goes for walks outside of the house. She has no chest pain, dyspnea or wheezing and denies pedal edema. Doing well on her statin and denies myalgias or other adverse effect. Her depression is controlled on her antidepressant and she denies suicidal ideation or intent.  She denies presence of intermittent claudication and has no additional concerns today.  Past Medical History:  Diagnosis Date  . Acute pyelonephritis   . Bacteremia, escherichia coli 01/12/2015  . Chest pain   . Critical lower limb ischemia   . Hyperlipidemia   . Hypertension   . Left carotid bruit   . PAD (peripheral  artery disease) ([place])   . Type II diabetes mellitus (HCC)    No Known Allergies  Current Outpatient Medications on File Prior to Visit  Medication Sig Dispense Refill  . amLODipine (NORVASC) 10 MG tablet Take 1 tablet (10 mg total) by mouth daily. 90 tablet 1  . aspirin 81 MG EC tablet Take 1 tablet (81 mg total) by mouth daily. 30 tablet 3  . atorvastatin (LIPITOR) 40 MG tablet Take 1 tablet (40 mg total) by mouth daily. 90 tablet 1  . Blood Glucose Monitoring Suppl (TRUE METRIX METER) DEVI 1 each by Does not apply route 3 (three) times daily before meals. 1 Device 0  . carvedilol (COREG) 25 MG tablet Take 1 tablet (25 mg total) by mouth 2 (two) times daily with a meal. 180 tablet 1  . FLUoxetine (PROZAC) 20 MG tablet Take 2 tablets (40 mg total) by mouth daily. 180 tablet 1  . gabapentin (NEURONTIN) 300 MG capsule TAKE 1 CAPSULE BY MOUTH 3 TIMES DAILY. 90 capsule 1  . glimepiride (AMARYL) 4 MG tablet Take 1 tablet (4 mg total) by mouth daily with breakfast. 90 tablet 1  . glucose blood test strip Use 3 times daily before meals 100 each 12  . ibuprofen (ADVIL,MOTRIN) 200 MG tablet Take 400 mg by mouth every 6 (six) hours as needed (pain/fever). Reported on 06/10/2016    . Insulin Isophane & Regular Human (HUMULIN 70/30 KWIKPEN) (70-30) 100 UNIT/ML PEN Inject subcutaneously twice daily 40 units in the morning and 25 units in the evening 30 mL 3  . Insulin Syringe-Needle U-100 (BD INSULIN SYRINGE ULTRAFINE) 31G X 15/64"  0.5 ML MISC Use subcutaneously twice daily 60 each 12  . isosorbide mononitrate (IMDUR) 60 MG 24 hr tablet Take 1 tablet (60 mg total) by mouth daily. 90 tablet 1  . losartan (COZAAR) 100 MG tablet TAKE 1 TABLET BY MOUTH DAILY. 90 tablet 1  . metFORMIN (GLUCOPHAGE) 1000 MG tablet Take 1 tablet (1,000 mg total) by mouth 2 (two) times daily with a meal. 180 tablet 1  . pneumococcal 13-valent conjugate vaccine (PREVNAR 13) SUSP injection Inject 0.5 mLs into the muscle as directed.  0.5 mL 0  . TRUEPLUS LANCETS 28G MISC 1 each by Does not apply route 3 (three) times daily before meals. 100 each 12  . TRUEPLUS PEN NEEDLES 31G X 6 MM MISC USE AS DIRECTED 2 TIMES DAILY AT 8 AM AND 10 PM 100 each 12   No current facility-administered medications on file prior to visit.     Observations/Objective: Alert, awake, oriented x3 Not in acute distress  CMP Latest Ref Rng & Units 09/13/2018 02/23/2018 03/24/2017  Glucose 65 - 99 mg/dL 171(H) 260(H) 145(H)  BUN 8 - 27 mg/dL '19 17 23  ' Creatinine 0.57 - 1.00 mg/dL 1.37(H) 1.12(H) 1.13(H)  Sodium 134 - 144 mmol/L 141 137 140  Potassium 3.5 - 5.2 mmol/L 4.4 4.4 4.6  Chloride 96 - 106 mmol/L 105 102 101  CO2 20 - 29 mmol/L 18(L) 19(L) 22  Calcium 8.7 - 10.3 mg/dL 9.3 9.4 9.5  Total Protein 6.0 - 8.5 g/dL 6.7 6.9 6.9  Total Bilirubin 0.0 - 1.2 mg/dL 0.4 0.3 0.3  Alkaline Phos 39 - 117 IU/L 112 109 91  AST 0 - 40 IU/L '22 15 21  ' ALT 0 - 32 IU/L '18 17 21    ' Lipid Panel     Component Value Date/Time   CHOL 139 09/13/2018 1002   TRIG 102 09/13/2018 1002   HDL 45 09/13/2018 1002   CHOLHDL 3.1 09/13/2018 1002   CHOLHDL 3.2 02/12/2016 1707   VLDL 27 02/12/2016 1707   LDLCALC 74 09/13/2018 1002    Lab Results  Component Value Date   HGBA1C 8.9 (A) 12/13/2018    Assessment and Plan: 1. Essential hypertension Controlled from blood sugar logs at home Counseled on blood pressure goal of less than 130/80, low-sodium, DASH diet, medication compliance, 150 minutes of moderate intensity exercise per week. Discussed medication compliance, adverse effects. - amLODipine (NORVASC) 10 MG tablet; Take 1 tablet (10 mg total) by mouth daily.  Dispense: 90 tablet; Refill: 1 - carvedilol (COREG) 25 MG tablet; Take 1 tablet (25 mg total) by mouth 2 (two) times daily with a meal.  Dispense: 180 tablet; Refill: 1 - isosorbide mononitrate (IMDUR) 60 MG 24 hr tablet; Take 1 tablet (60 mg total) by mouth daily.  Dispense: 90 tablet; Refill: 1 -  losartan (COZAAR) 100 MG tablet; TAKE 1 TABLET BY MOUTH DAILY.  Dispense: 90 tablet; Refill: 1  2. Pure hypercholesterolemia Controlled Low-cholesterol diet - atorvastatin (LIPITOR) 40 MG tablet; Take 1 tablet (40 mg total) by mouth daily.  Dispense: 90 tablet; Refill: 1  3. Moderate episode of recurrent major depressive disorder (HCC) Stable - FLUoxetine (PROZAC) 20 MG tablet; Take 2 tablets (40 mg total) by mouth daily.  Dispense: 180 tablet; Refill: 1  4. Type II diabetes mellitus with neurological manifestations (HCC) Stable - gabapentin (NEURONTIN) 300 MG capsule; TAKE 1 CAPSULE BY MOUTH 3 TIMES DAILY.  Dispense: 270 capsule; Refill: 1  5. Type 2 diabetes mellitus with complication, with long-term  current use of insulin ([place]) Uncontrolled with A1c of 8.7 We will order A1c and adjust regimen accordingly Diabetic diet, lifestyle modifications. - Insulin Isophane & Regular Human (HUMULIN 70/30 KWIKPEN) (70-30) 100 UNIT/ML PEN; Inject subcutaneously twice daily 40 units in the morning and 25 units in the evening  Dispense: 30 mL; Refill: 3 - metFORMIN (GLUCOPHAGE) 1000 MG tablet; Take 1 tablet (1,000 mg total) by mouth 2 (two) times daily with a meal.  Dispense: 180 tablet; Refill: 1 - CMP14+EGFR; Future - Lipid panel; Future - Microalbumin/Creatinine Ratio, Urine; Future - Hemoglobin A1c; Future   Follow Up Instructions: Return in about 3 months (around 08/23/2019).    I discussed the assessment and treatment plan with the patient. The patient was provided an opportunity to ask questions and all were answered. The patient agreed with the plan and demonstrated an understanding of the instructions.   The patient was advised to call back or seek an in-person evaluation if the symptoms worsen or if the condition fails to improve as anticipated.     I provided 26 minutes total of non-face-to-face time during this encounter including median intraservice time, reviewing previous  notes, labs, imaging, medications and explaining diagnosis and management.     Charlott Rakes, MD, FAAFP. [facility] and [place], [place]   05/23/2019, 10:04 AM

## 2019-05-23 NOTE — Progress Notes (Signed)
Patient has been called and DOB has been verified. Patient has been screened and transferred to PCP to start phone visit.     

## 2019-06-15 ENCOUNTER — Telehealth: Payer: Self-pay | Admitting: Gastroenterology

## 2019-06-15 ENCOUNTER — Ambulatory Visit: Payer: Self-pay | Admitting: Gastroenterology

## 2019-06-15 ENCOUNTER — Encounter: Payer: Self-pay | Admitting: Gastroenterology

## 2019-06-15 NOTE — Telephone Encounter (Signed)
PATIENT WAS A NO SHOW AND LETTER SENT  °

## 2019-06-29 ENCOUNTER — Other Ambulatory Visit: Payer: Self-pay

## 2019-06-29 DIAGNOSIS — Z794 Long term (current) use of insulin: Secondary | ICD-10-CM

## 2019-06-29 DIAGNOSIS — E118 Type 2 diabetes mellitus with unspecified complications: Secondary | ICD-10-CM

## 2019-06-29 MED ORDER — HUMULIN 70/30 KWIKPEN (70-30) 100 UNIT/ML ~~LOC~~ SUPN: PEN_INJECTOR | SUBCUTANEOUS | 0 refills | Status: DC

## 2019-06-29 NOTE — Telephone Encounter (Signed)
This patient was last seen 05/23/2019,has no future appt scheduled,please authorize PASS fill if appropriate.  This script will go to [facility].

## 2021-08-18 ENCOUNTER — Encounter: Payer: Self-pay | Admitting: Family Medicine

## 2021-08-18 ENCOUNTER — Ambulatory Visit: Payer: Self-pay | Attending: Family Medicine | Admitting: Family Medicine

## 2021-08-18 ENCOUNTER — Other Ambulatory Visit: Payer: Self-pay

## 2021-08-18 VITALS — BP 217/103 | HR 82 | Ht 62.0 in | Wt 150.4 lb

## 2021-08-18 DIAGNOSIS — E118 Type 2 diabetes mellitus with unspecified complications: Secondary | ICD-10-CM

## 2021-08-18 DIAGNOSIS — Z794 Long term (current) use of insulin: Secondary | ICD-10-CM

## 2021-08-18 DIAGNOSIS — E1165 Type 2 diabetes mellitus with hyperglycemia: Secondary | ICD-10-CM

## 2021-08-18 DIAGNOSIS — E7800 Pure hypercholesterolemia, unspecified: Secondary | ICD-10-CM

## 2021-08-18 DIAGNOSIS — I10 Essential (primary) hypertension: Secondary | ICD-10-CM

## 2021-08-18 LAB — POCT URINALYSIS DIP (CLINITEK)
Bilirubin, UA: NEGATIVE
Glucose, UA: >=1000 mg/dL — AB
Ketones, POC UA: NEGATIVE mg/dL
Nitrite, UA: NEGATIVE
POC PROTEIN,UA: >=300 — AB
Spec Grav, UA: 1.015 (ref 1.010–1.025)
Urobilinogen, UA: 0.2 E.U./dL
pH, UA: 7 (ref 5.0–8.0)

## 2021-08-18 LAB — GLUCOSE, POCT (MANUAL RESULT ENTRY)
POC Glucose: 506 mg/dl — AB (ref 70–99)
POC Glucose: 534 mg/dl — AB (ref 70–99)

## 2021-08-18 LAB — POCT GLYCOSYLATED HEMOGLOBIN (HGB A1C): HbA1c, POC (controlled diabetic range): 11.6 % — AB (ref 0.0–7.0)

## 2021-08-18 MED ORDER — HUMULIN 70/30 KWIKPEN (70-30) 100 UNIT/ML ~~LOC~~ SUPN
PEN_INJECTOR | SUBCUTANEOUS | 6 refills | Status: DC
  Filled 2021-08-18: qty 15, 23d supply, fill #0
  Filled 2021-09-18: qty 15, 23d supply, fill #1
  Filled 2021-10-15: qty 15, 23d supply, fill #2

## 2021-08-18 MED ORDER — INSULIN ASPART 100 UNIT/ML IJ SOLN
20.0000 [IU] | Freq: Once | INTRAMUSCULAR | Status: AC
  Administered 2021-08-18: 20 [IU] via SUBCUTANEOUS

## 2021-08-18 MED ORDER — CARVEDILOL 25 MG PO TABS
25.0000 mg | ORAL_TABLET | Freq: Two times a day (BID) | ORAL | 1 refills | Status: DC
  Filled 2021-08-18: qty 60, 30d supply, fill #0
  Filled 2021-09-18: qty 60, 30d supply, fill #1
  Filled 2021-10-15 (×2): qty 60, 30d supply, fill #2
  Filled 2021-11-26: qty 60, 30d supply, fill #3
  Filled 2022-01-01: qty 60, 30d supply, fill #4
  Filled 2022-01-01: qty 60, 30d supply, fill #0
  Filled 2022-02-02: qty 60, 30d supply, fill #1

## 2021-08-18 MED ORDER — CLONIDINE HCL 0.2 MG PO TABS
0.2000 mg | ORAL_TABLET | Freq: Once | ORAL | Status: AC
  Administered 2021-08-18: 0.2 mg via ORAL

## 2021-08-18 MED ORDER — ATORVASTATIN CALCIUM 40 MG PO TABS
40.0000 mg | ORAL_TABLET | Freq: Every day | ORAL | 1 refills | Status: DC
  Filled 2021-08-18: qty 90, 90d supply, fill #0
  Filled 2021-10-15: qty 90, 90d supply, fill #1

## 2021-08-18 MED ORDER — LOSARTAN POTASSIUM 100 MG PO TABS
ORAL_TABLET | ORAL | 1 refills | Status: DC
  Filled 2021-08-18: qty 30, 30d supply, fill #0
  Filled 2021-09-18: qty 30, 30d supply, fill #1
  Filled 2021-10-15: qty 30, 30d supply, fill #2
  Filled 2021-11-26: qty 30, 30d supply, fill #3
  Filled 2022-01-01: qty 30, 30d supply, fill #0
  Filled 2022-01-01: qty 30, 30d supply, fill #4
  Filled 2022-02-02: qty 30, 30d supply, fill #1

## 2021-08-18 MED ORDER — METFORMIN HCL 1000 MG PO TABS
1000.0000 mg | ORAL_TABLET | Freq: Two times a day (BID) | ORAL | 1 refills | Status: DC
  Filled 2021-08-18: qty 60, 30d supply, fill #0
  Filled 2021-09-18: qty 60, 30d supply, fill #1
  Filled 2021-10-15: qty 60, 30d supply, fill #2
  Filled 2021-11-26: qty 60, 30d supply, fill #3
  Filled 2022-01-01: qty 60, 30d supply, fill #0
  Filled 2022-01-01: qty 60, 30d supply, fill #4
  Filled 2022-02-02: qty 60, 30d supply, fill #1

## 2021-08-18 NOTE — Progress Notes (Signed)
Subjective:  Patient ID: Samantha [name], female    DOB: 11/11/1951  Age: 70 y.o. MRN: [id number]  CC: Diabetes   HPI Samantha [name] is a 70 y.o. year old female with a history of hypertension, type 2 diabetes mellitus (A1c 11.6), hyperlipidemia, depression, peripheral vascular disease who is seen for a follow-up visit today.  Last visit was a telehealth visit on 05/22/2021. She was in [place] fr 2 years   Interval History: She has been out of insulin for 2 weeks.  A1c is 11.6 up from 8.7 previously.  Blood sugar is 506 in the clinic today.  She denies presence of blurry vision, abdominal pain, nausea or vomiting.  Has been out of all her antihypertensives and has been on just Losartan with her systolic BP running around 170 at home per patient.  Blood pressure in the clinic is significantly elevated at 217/103.  She informs me that while in [place] she was only on losartan. She is accompanied by her daughter today and has no additional concerns.  Past Medical History:  Diagnosis Date   Acute pyelonephritis    Bacteremia, escherichia coli 01/12/2015   Chest pain    Critical lower limb ischemia (HCC)    Hyperlipidemia    Hypertension    Left carotid bruit    PAD (peripheral artery disease) (HCC)    Type II diabetes mellitus (HCC)     Past Surgical History:  Procedure Laterality Date   ABDOMINAL ANGIOGRAM  05/16/2015   Procedure: Abdominal Angiogram;  Surgeon: Runell Gess, MD;  Location: [facility];  Service: Cardiovascular;;   ABDOMINAL HYSTERECTOMY  ~ 2000   PERIPHERAL VASCULAR CATHETERIZATION Bilateral 05/16/2015   Procedure: Lower Extremity Angiography;  Surgeon: Runell Gess, MD; renal arteries widely patent, L-SFA 75%, L-pop 95%, L-peroneal 90%; R-SFA 40%, R-pop 50%, R-prox peroneal 90%; directional atherectomy L-SFA, p-pop, drug-eluting balloon angioplasty reducing the stenoses to 0, small linear dissection was not flow limiting       PERIPHERAL VASCULAR  CATHETERIZATION Left 05/16/2015   Procedure: Peripheral Vascular Atherectomy;  Surgeon: Runell Gess, MD;  Location: [facility];  Service: Cardiovascular;  Laterality: Left;  sfa    Family History  Problem Relation Age of Onset   Hypertension Mother     No Known Allergies  Outpatient Medications Prior to Visit  Medication Sig Dispense Refill   amLODipine (NORVASC) 10 MG tablet Take 1 tablet (10 mg total) by mouth daily. 90 tablet 1   aspirin 81 MG EC tablet Take 1 tablet (81 mg total) by mouth daily. 30 tablet 3   Blood Glucose Monitoring Suppl (TRUE METRIX METER) DEVI 1 each by Does not apply route 3 (three) times daily before meals. 1 Device 0   FLUoxetine (PROZAC) 20 MG tablet Take 2 tablets (40 mg total) by mouth daily. 180 tablet 1   gabapentin (NEURONTIN) 300 MG capsule TAKE 1 CAPSULE BY MOUTH 3 TIMES DAILY. 270 capsule 1   glimepiride (AMARYL) 4 MG tablet Take 1 tablet (4 mg total) by mouth daily with breakfast. 90 tablet 1   glucose blood test strip Use 3 times daily before meals 100 each 12   ibuprofen (ADVIL,MOTRIN) 200 MG tablet Take 400 mg by mouth every 6 (six) hours as needed (pain/fever). Reported on 06/10/2016     Insulin Syringe-Needle U-100 (BD INSULIN SYRINGE ULTRAFINE) 31G X 15/64" 0.5 ML MISC Use subcutaneously twice daily 60 each 12   pneumococcal 13-valent conjugate vaccine (PREVNAR 13) SUSP injection  Inject 0.5 mLs into the muscle as directed. 0.5 mL 0   TRUEPLUS LANCETS 28G MISC 1 each by Does not apply route 3 (three) times daily before meals. 100 each 12   TRUEPLUS PEN NEEDLES 31G X 6 MM MISC USE AS DIRECTED 2 TIMES DAILY AT 8 AM AND 10 PM 100 each 12   atorvastatin (LIPITOR) 40 MG tablet Take 1 tablet (40 mg total) by mouth daily. 90 tablet 1   carvedilol (COREG) 25 MG tablet Take 1 tablet (25 mg total) by mouth 2 (two) times daily with a meal. 180 tablet 1   Insulin Isophane & Regular Human (HUMULIN 70/30 KWIKPEN) (70-30) 100 UNIT/ML PEN Inject  subcutaneously twice daily 40 units in the morning and 25 units in the evening 90 mL 0   isosorbide mononitrate (IMDUR) 60 MG 24 hr tablet Take 1 tablet (60 mg total) by mouth daily. 90 tablet 1   losartan (COZAAR) 100 MG tablet TAKE 1 TABLET BY MOUTH DAILY. 90 tablet 1   metFORMIN (GLUCOPHAGE) 1000 MG tablet Take 1 tablet (1,000 mg total) by mouth 2 (two) times daily with a meal. 180 tablet 1   No facility-administered medications prior to visit.     ROS Review of Systems  Constitutional:  Negative for activity change, appetite change and fatigue.  HENT:  Negative for congestion, sinus pressure and sore throat.   Eyes:  Negative for visual disturbance.  Respiratory:  Negative for cough, chest tightness, shortness of breath and wheezing.   Cardiovascular:  Negative for chest pain and palpitations.  Gastrointestinal:  Negative for abdominal distention, abdominal pain and constipation.  Endocrine: Negative for polydipsia.  Genitourinary:  Negative for dysuria and frequency.  Musculoskeletal:  Negative for arthralgias and back pain.  Skin:  Negative for rash.  Neurological:  Negative for tremors, light-headedness and numbness.  Hematological:  Does not bruise/bleed easily.  Psychiatric/Behavioral:  Negative for agitation and behavioral problems.    Objective:  BP (!) 217/103   Pulse 82   Ht 5\' 2"  (1.575 m)   Wt 150 lb 6.4 oz (68.2 kg)   SpO2 98%   BMI 27.51 kg/m   BP/Weight 08/18/2021 01/23/2019 12/13/2018  Systolic BP 217 160 161  Diastolic BP 103 72 82  Wt. (Lbs) 150.4 152.2 148.6  BMI 27.51 27.84 27.18      Physical Exam Constitutional:      Appearance: She is well-developed.  Cardiovascular:     Rate and Rhythm: Normal rate.     Heart sounds: Normal heart sounds. No murmur heard. Pulmonary:     Effort: Pulmonary effort is normal.     Breath sounds: Normal breath sounds. No wheezing or rales.  Chest:     Chest wall: No tenderness.  Abdominal:     General: Bowel  sounds are normal. There is no distension.     Palpations: Abdomen is soft. There is no mass.     Tenderness: There is no abdominal tenderness.  Musculoskeletal:        General: Normal range of motion.     Right lower leg: No edema.     Left lower leg: No edema.  Neurological:     Mental Status: She is alert and oriented to person, place, and time.  Psychiatric:        Mood and Affect: Mood normal.    CMP Latest Ref Rng & Units 09/13/2018 02/23/2018 03/24/2017  Glucose 65 - 99 mg/dL 960(A171(H) 540(J260(H) 811(B145(H)  BUN 8 - 27 mg/dL 19  17 23  Creatinine 0.57 - 1.00 mg/dL 8.17(R) 1.16(F) 7.90(X)  Sodium 134 - 144 mmol/L 141 137 140  Potassium 3.5 - 5.2 mmol/L 4.4 4.4 4.6  Chloride 96 - 106 mmol/L 105 102 101  CO2 20 - 29 mmol/L 18(L) 19(L) 22  Calcium 8.7 - 10.3 mg/dL 9.3 9.4 9.5  Total Protein 6.0 - 8.5 g/dL 6.7 6.9 6.9  Total Bilirubin 0.0 - 1.2 mg/dL 0.4 0.3 0.3  Alkaline Phos 39 - 117 IU/L 112 109 91  AST 0 - 40 IU/L 22 15 21   ALT 0 - 32 IU/L 18 17 21     Lipid Panel     Component Value Date/Time   CHOL 139 09/13/2018 1002   TRIG 102 09/13/2018 1002   HDL 45 09/13/2018 1002   CHOLHDL 3.1 09/13/2018 1002   CHOLHDL 3.2 02/12/2016 1707   VLDL 27 02/12/2016 1707   LDLCALC 74 09/13/2018 1002    CBC    Component Value Date/Time   WBC 11.1 (H) 01/16/2016 1843   RBC 4.36 01/16/2016 1843   HGB 14.1 01/16/2016 1843   HCT 39.0 01/16/2016 1843   PLT 328 01/16/2016 1843   MCV 89.4 01/16/2016 1843   MCH 32.3 01/16/2016 1843   MCHC 36.2 (H) 01/16/2016 1843   RDW 12.3 01/16/2016 1843   LYMPHSABS 2.1 01/16/2016 1843   MONOABS 0.8 01/16/2016 1843   EOSABS 0.3 01/16/2016 1843   BASOSABS 0.1 01/16/2016 1843    Lab Results  Component Value Date   HGBA1C 11.6 (A) 08/18/2021    Assessment & Plan:  1. Type 2 diabetes mellitus with hyperglycemia, with long-term current use of insulin (HCC) Uncontrolled with A1c of 11.6; goal is less than 7.0 Severe hyperglycemia of 506 with patient at  risk of progressing to DKA.  Urine negative for ketones NovoLog 20 units administered and patient provided with oral hydration.  Observed in clinic for 40 minutes. Restarted her insulin Counseled on Diabetic diet, my plate method, 01/18/2016 minutes of moderate intensity exercise/week Blood sugar logs with fasting goals of 80-120 mg/dl, random of less than 08/20/2021 and in the event of sugars less than 60 mg/dl or greater than 833 mg/dl encouraged to notify the clinic. Advised on the need for annual eye exams, annual foot exams, Pneumonia vaccine.  - POCT glucose (manual entry) - POCT glycosylated hemoglobin (Hb A1C) - insulin isophane & regular human KwikPen (HUMULIN 70/30 KWIKPEN) (70-30) 100 UNIT/ML KwikPen; Inject subcutaneously twice daily 40 units in the morning and 25 units in the evening  Dispense: 90 mL; Refill: 6 - metFORMIN (GLUCOPHAGE) 1000 MG tablet; Take 1 tablet (1,000 mg total) by mouth 2 (two) times daily with a meal.  Dispense: 180 tablet; Refill: 1 - insulin aspart (novoLOG) injection 20 Units - POCT glucose (manual entry)  2. Accelerated hypertension Clonidine administered in the clinic impression observed for 40 minutes with repeat blood pressure performed We will add back her antihypertensive 1 at a time to prevent rapid hypotension We will see back in 1 month and add amlodipine if still above goal - carvedilol (COREG) 25 MG tablet; Take 1 tablet (25 mg total) by mouth 2 (two) times daily with a meal.  Dispense: 180 tablet; Refill: 1 - losartan (COZAAR) 100 MG tablet; TAKE 1 TABLET BY MOUTH DAILY.  Dispense: 90 tablet; Refill: 1 - cloNIDine (CATAPRES) tablet 0.2 mg  3. Pure hypercholesterolemia She needs a lipid panel but is not fasting today Will send of lipid panel at next visit - atorvastatin (  LIPITOR) 40 MG tablet; Take 1 tablet (40 mg total) by mouth daily.  Dispense: 90 tablet; Refill: 1   Health Care Maintenance: Since NovoLog and clonidine have been administered today we  will follow-up on additional care gaps at next visit Due for labs with patient left before this could be obtained.  Will order at next visit. Meds ordered this encounter  Medications   atorvastatin (LIPITOR) 40 MG tablet    Sig: Take 1 tablet (40 mg total) by mouth daily.    Dispense:  90 tablet    Refill:  1   carvedilol (COREG) 25 MG tablet    Sig: Take 1 tablet (25 mg total) by mouth 2 (two) times daily with a meal.    Dispense:  180 tablet    Refill:  1   insulin isophane & regular human KwikPen (HUMULIN 70/30 KWIKPEN) (70-30) 100 UNIT/ML KwikPen    Sig: Inject subcutaneously twice daily 40 units in the morning and 25 units in the evening    Dispense:  90 mL    Refill:  6   losartan (COZAAR) 100 MG tablet    Sig: TAKE 1 TABLET BY MOUTH DAILY.    Dispense:  90 tablet    Refill:  1   metFORMIN (GLUCOPHAGE) 1000 MG tablet    Sig: Take 1 tablet (1,000 mg total) by mouth 2 (two) times daily with a meal.    Dispense:  180 tablet    Refill:  1   cloNIDine (CATAPRES) tablet 0.2 mg   insulin aspart (novoLOG) injection 20 Units    Follow-up: Return in about 1 month (around 09/18/2021) for Follow-up on hypertension.    45 minutes of total time spent including median intraservice time, monitoring after administering insulin and clonidine and educating patient on the need to be compliant with her medications and risks of noncompliance.  Provided education with regards to home blood pressure and blood glucose monitoring and this will be reviewed at her next visit.   Hoy Register, MD, FAAFP. [facility] [place], [place] [phone]   08/18/2021, 11:56 AM

## 2021-08-19 ENCOUNTER — Other Ambulatory Visit: Payer: Self-pay | Admitting: Family Medicine

## 2021-08-19 ENCOUNTER — Other Ambulatory Visit: Payer: Self-pay

## 2021-08-19 DIAGNOSIS — E118 Type 2 diabetes mellitus with unspecified complications: Secondary | ICD-10-CM

## 2021-08-19 DIAGNOSIS — Z794 Long term (current) use of insulin: Secondary | ICD-10-CM

## 2021-08-19 MED ORDER — TRUEPLUS PEN NEEDLES 31G X 6 MM MISC
1.0000 | Freq: Two times a day (BID) | 12 refills | Status: DC
  Filled 2021-08-19: qty 100, 50d supply, fill #0
  Filled 2021-10-15: qty 100, 50d supply, fill #1
  Filled 2021-11-19: qty 100, 50d supply, fill #2
  Filled 2022-07-02: qty 100, 50d supply, fill #0

## 2021-09-02 ENCOUNTER — Encounter: Payer: Self-pay | Admitting: Family Medicine

## 2021-09-02 LAB — HM DIABETES EYE EXAM

## 2021-09-18 ENCOUNTER — Telehealth: Payer: Self-pay | Admitting: Family Medicine

## 2021-09-18 ENCOUNTER — Other Ambulatory Visit: Payer: Self-pay

## 2021-09-18 DIAGNOSIS — Z794 Long term (current) use of insulin: Secondary | ICD-10-CM

## 2021-09-18 DIAGNOSIS — E118 Type 2 diabetes mellitus with unspecified complications: Secondary | ICD-10-CM

## 2021-09-18 NOTE — Telephone Encounter (Signed)
[facility] [facility] [employer] called and spoke to Herbst, Pensions consultant about the refill(s) requested. She says she can refill the Carvedilol, Hum 70/30 Kwikpen, Losartan, and Metformin. She says the Trueplus Pen Needles were picked up on 08/19/21 #50 and not due for a refill until 10/08/21; Atorvastatin #90 picked up on 8/22 and not due for a refill until 11/16/21. I advised I will let the patient know. Using [facility] ES#[id number]. Patient called and advised of the above, she verbalized understanding.

## 2021-09-18 NOTE — Telephone Encounter (Signed)
Medication Refill - Medication:  Insulin Pen Needle (TRUEPLUS PEN NEEDLES) 31G X 6 MM MISC  atorvastatin (LIPITOR) 40 MG tablet  carvedilol (COREG) 25 MG tablet  insulin isophane & regular human KwikPen (HUMULIN 70/30 KWIKPEN) (70-30) 100 UNIT/ML KwikPen losartan (COZAAR) 100 MG tablet  metFORMIN (GLUCOPHAGE) 1000 MG tablet   Has the patient contacted their pharmacy? No.  Preferred Pharmacy (with phone number or street name):  [facility]  [address], [place] [address]  Phone:  [phone]  Fax:  [phone]  Has the patient been seen for an appointment in the last year OR does the patient have an upcoming appointment? Yes.    Agent: Please be advised that RX refills may take up to 3 business days. We ask that you follow-up with your pharmacy.

## 2021-09-19 ENCOUNTER — Other Ambulatory Visit: Payer: Self-pay

## 2021-09-23 ENCOUNTER — Telehealth: Payer: Self-pay | Admitting: Family Medicine

## 2021-09-23 NOTE — Telephone Encounter (Signed)
Copied from CRM [id number]. Topic: Appointment Scheduling - Scheduling Inquiry for Clinic >> Sep 18, 2021 10:55 AM Traci Sermon wrote: Reason for CRM: Pt called in wanting to get an appt with Mikle Bosworth, please advise.

## 2021-09-23 NOTE — Telephone Encounter (Signed)
I return Pt call, I schedule a financial appt 

## 2021-09-30 ENCOUNTER — Ambulatory Visit: Payer: Self-pay | Attending: Family Medicine

## 2021-09-30 ENCOUNTER — Other Ambulatory Visit: Payer: Self-pay

## 2021-10-15 ENCOUNTER — Other Ambulatory Visit: Payer: Self-pay

## 2021-10-16 ENCOUNTER — Other Ambulatory Visit: Payer: Self-pay

## 2021-10-20 ENCOUNTER — Other Ambulatory Visit: Payer: Self-pay

## 2021-11-08 ENCOUNTER — Emergency Department (HOSPITAL_COMMUNITY): Payer: Self-pay

## 2021-11-08 ENCOUNTER — Inpatient Hospital Stay (HOSPITAL_COMMUNITY): Payer: Self-pay

## 2021-11-08 ENCOUNTER — Inpatient Hospital Stay (HOSPITAL_COMMUNITY)
Admission: EM | Admit: 2021-11-08 | Discharge: 2021-11-10 | DRG: 066 | Disposition: A | Discharge status: Home / Self Care | Payer: Self-pay | Attending: Internal Medicine | Admitting: Internal Medicine

## 2021-11-08 ENCOUNTER — Encounter (HOSPITAL_COMMUNITY): Payer: Self-pay

## 2021-11-08 DIAGNOSIS — E785 Hyperlipidemia, unspecified: Secondary | ICD-10-CM | POA: Diagnosis present

## 2021-11-08 DIAGNOSIS — I639 Cerebral infarction, unspecified: Secondary | ICD-10-CM | POA: Diagnosis present

## 2021-11-08 DIAGNOSIS — I63539 Cerebral infarction due to unspecified occlusion or stenosis of unspecified posterior cerebral artery: Secondary | ICD-10-CM | POA: Diagnosis present

## 2021-11-08 DIAGNOSIS — E1149 Type 2 diabetes mellitus with other diabetic neurological complication: Secondary | ICD-10-CM

## 2021-11-08 DIAGNOSIS — R200 Anesthesia of skin: Secondary | ICD-10-CM

## 2021-11-08 DIAGNOSIS — I63542 Cerebral infarction due to unspecified occlusion or stenosis of left cerebellar artery: Principal | ICD-10-CM | POA: Diagnosis present

## 2021-11-08 DIAGNOSIS — I129 Hypertensive chronic kidney disease with stage 1 through stage 4 chronic kidney disease, or unspecified chronic kidney disease: Secondary | ICD-10-CM | POA: Diagnosis present

## 2021-11-08 DIAGNOSIS — E1140 Type 2 diabetes mellitus with diabetic neuropathy, unspecified: Secondary | ICD-10-CM | POA: Diagnosis present

## 2021-11-08 DIAGNOSIS — E1151 Type 2 diabetes mellitus with diabetic peripheral angiopathy without gangrene: Secondary | ICD-10-CM | POA: Diagnosis present

## 2021-11-08 DIAGNOSIS — Z7982 Long term (current) use of aspirin: Secondary | ICD-10-CM

## 2021-11-08 DIAGNOSIS — R209 Unspecified disturbances of skin sensation: Secondary | ICD-10-CM | POA: Diagnosis present

## 2021-11-08 DIAGNOSIS — E1165 Type 2 diabetes mellitus with hyperglycemia: Secondary | ICD-10-CM | POA: Diagnosis present

## 2021-11-08 DIAGNOSIS — Z794 Long term (current) use of insulin: Secondary | ICD-10-CM

## 2021-11-08 DIAGNOSIS — Z7984 Long term (current) use of oral hypoglycemic drugs: Secondary | ICD-10-CM

## 2021-11-08 DIAGNOSIS — R29701 NIHSS score 1: Secondary | ICD-10-CM | POA: Diagnosis present

## 2021-11-08 DIAGNOSIS — Z79899 Other long term (current) drug therapy: Secondary | ICD-10-CM

## 2021-11-08 DIAGNOSIS — Z8249 Family history of ischemic heart disease and other diseases of the circulatory system: Secondary | ICD-10-CM

## 2021-11-08 DIAGNOSIS — I712 Thoracic aortic aneurysm, without rupture, unspecified: Secondary | ICD-10-CM | POA: Diagnosis present

## 2021-11-08 DIAGNOSIS — E1122 Type 2 diabetes mellitus with diabetic chronic kidney disease: Secondary | ICD-10-CM | POA: Diagnosis present

## 2021-11-08 DIAGNOSIS — Z8673 Personal history of transient ischemic attack (TIA), and cerebral infarction without residual deficits: Secondary | ICD-10-CM

## 2021-11-08 DIAGNOSIS — Z66 Do not resuscitate: Secondary | ICD-10-CM | POA: Diagnosis present

## 2021-11-08 DIAGNOSIS — I6322 Cerebral infarction due to unspecified occlusion or stenosis of basilar arteries: Secondary | ICD-10-CM

## 2021-11-08 DIAGNOSIS — N189 Chronic kidney disease, unspecified: Secondary | ICD-10-CM | POA: Diagnosis present

## 2021-11-08 DIAGNOSIS — Z20822 Contact with and (suspected) exposure to covid-19: Secondary | ICD-10-CM | POA: Diagnosis present

## 2021-11-08 LAB — BLOOD GAS, VENOUS
Acid-Base Excess: 1 mmol/L (ref 0.0–2.0)
Bicarbonate: 23.1 mmol/L (ref 20.0–28.0)
O2 Saturation: 63 %
Patient temperature: 98.6
pCO2, Ven: 31.9 mmHg — ABNORMAL LOW (ref 44.0–60.0)
pH, Ven: 7.473 — ABNORMAL HIGH (ref 7.250–7.430)
pO2, Ven: 33.1 mmHg (ref 32.0–45.0)

## 2021-11-08 LAB — CBC WITH DIFFERENTIAL/PLATELET
Abs Immature Granulocytes: 0.04 10*3/uL (ref 0.00–0.07)
Basophils Absolute: 0.1 10*3/uL (ref 0.0–0.1)
Basophils Relative: 1 %
Eosinophils Absolute: 0.3 10*3/uL (ref 0.0–0.5)
Eosinophils Relative: 3 %
HCT: 38.6 % (ref 36.0–46.0)
Hemoglobin: 13.7 g/dL (ref 12.0–15.0)
Immature Granulocytes: 0 %
Lymphocytes Relative: 15 %
Lymphs Abs: 1.4 10*3/uL (ref 0.7–4.0)
MCH: 32.7 pg (ref 26.0–34.0)
MCHC: 35.5 g/dL (ref 30.0–36.0)
MCV: 92.1 fL (ref 80.0–100.0)
Monocytes Absolute: 0.5 10*3/uL (ref 0.1–1.0)
Monocytes Relative: 5 %
Neutro Abs: 7.2 10*3/uL (ref 1.7–7.7)
Neutrophils Relative %: 76 %
Platelets: 377 10*3/uL (ref 150–400)
RBC: 4.19 MIL/uL (ref 3.87–5.11)
RDW: 12.1 % (ref 11.5–15.5)
WBC: 9.5 10*3/uL (ref 4.0–10.5)
nRBC: 0 % (ref 0.0–0.2)

## 2021-11-08 LAB — COMPREHENSIVE METABOLIC PANEL
ALT: 24 U/L (ref 0–44)
AST: 23 U/L (ref 15–41)
Albumin: 4.2 g/dL (ref 3.5–5.0)
Alkaline Phosphatase: 82 U/L (ref 38–126)
Anion gap: 10 (ref 5–15)
BUN: 24 mg/dL — ABNORMAL HIGH (ref 8–23)
CO2: 21 mmol/L — ABNORMAL LOW (ref 22–32)
Calcium: 9.8 mg/dL (ref 8.9–10.3)
Chloride: 102 mmol/L (ref 98–111)
Creatinine, Ser: 1.57 mg/dL — ABNORMAL HIGH (ref 0.44–1.00)
GFR, Estimated: 36 mL/min — ABNORMAL LOW (ref >60)
Glucose, Bld: 261 mg/dL — ABNORMAL HIGH (ref 70–99)
Potassium: 4.3 mmol/L (ref 3.5–5.1)
Sodium: 133 mmol/L — ABNORMAL LOW (ref 135–145)
Total Bilirubin: 0.8 mg/dL (ref 0.3–1.2)
Total Protein: 7.5 g/dL (ref 6.5–8.1)

## 2021-11-08 LAB — ETHANOL: Alcohol, Ethyl (B): <10 mg/dL (ref <10)

## 2021-11-08 LAB — I-STAT CHEM 8, ED
BUN: 22 mg/dL (ref 8–23)
Calcium, Ion: 1.1 mmol/L — ABNORMAL LOW (ref 1.15–1.40)
Chloride: 107 mmol/L (ref 98–111)
Creatinine, Ser: 1.5 mg/dL — ABNORMAL HIGH (ref 0.44–1.00)
Glucose, Bld: 188 mg/dL — ABNORMAL HIGH (ref 70–99)
HCT: 33 % — ABNORMAL LOW (ref 36.0–46.0)
Hemoglobin: 11.2 g/dL — ABNORMAL LOW (ref 12.0–15.0)
Potassium: 3.5 mmol/L (ref 3.5–5.1)
Sodium: 141 mmol/L (ref 135–145)
TCO2: 20 mmol/L — ABNORMAL LOW (ref 22–32)

## 2021-11-08 LAB — RESP PANEL BY RT-PCR (FLU A&B, COVID) ARPGX2
Influenza A by PCR: NEGATIVE
Influenza B by PCR: NEGATIVE
SARS Coronavirus 2 by RT PCR: NEGATIVE

## 2021-11-08 LAB — APTT: aPTT: 33 seconds (ref 24–36)

## 2021-11-08 LAB — MAGNESIUM: Magnesium: 1.9 mg/dL (ref 1.7–2.4)

## 2021-11-08 LAB — GLUCOSE, CAPILLARY: Glucose-Capillary: 318 mg/dL — ABNORMAL HIGH (ref 70–99)

## 2021-11-08 LAB — TROPONIN I (HIGH SENSITIVITY)
Troponin I (High Sensitivity): 6 ng/L (ref <18)
Troponin I (High Sensitivity): 6 ng/L (ref <18)

## 2021-11-08 LAB — PROTIME-INR
INR: 1 (ref 0.8–1.2)
Prothrombin Time: 13.6 seconds (ref 11.4–15.2)

## 2021-11-08 MED ORDER — ASPIRIN 300 MG RE SUPP
300.0000 mg | Freq: Every day | RECTAL | Status: DC
  Filled 2021-11-08: qty 1

## 2021-11-08 MED ORDER — SENNOSIDES-DOCUSATE SODIUM 8.6-50 MG PO TABS: 1.0000 | ORAL_TABLET | Freq: Every evening | ORAL | Status: DC | PRN

## 2021-11-08 MED ORDER — GADOBUTROL 1 MMOL/ML IV SOLN
7.0000 mL | Freq: Once | INTRAVENOUS | Status: AC | PRN
  Administered 2021-11-08: 7 mL via INTRAVENOUS

## 2021-11-08 MED ORDER — ACETAMINOPHEN 160 MG/5ML PO SOLN: 650.0000 mg | ORAL | Status: DC | PRN

## 2021-11-08 MED ORDER — ACETAMINOPHEN 650 MG RE SUPP: 650.0000 mg | RECTAL | Status: DC | PRN

## 2021-11-08 MED ORDER — CLOPIDOGREL BISULFATE 300 MG PO TABS
300.0000 mg | ORAL_TABLET | Freq: Once | ORAL | Status: AC
  Administered 2021-11-08: 300 mg via ORAL
  Filled 2021-11-08: qty 1

## 2021-11-08 MED ORDER — ASPIRIN 325 MG PO TABS
325.0000 mg | ORAL_TABLET | Freq: Every day | ORAL | Status: DC
  Administered 2021-11-08 – 2021-11-10 (×3): 325 mg via ORAL
  Filled 2021-11-08 (×3): qty 1

## 2021-11-08 MED ORDER — ATORVASTATIN CALCIUM 80 MG PO TABS
80.0000 mg | ORAL_TABLET | Freq: Every day | ORAL | Status: DC
  Administered 2021-11-08 – 2021-11-10 (×3): 80 mg via ORAL
  Filled 2021-11-08 (×3): qty 1

## 2021-11-08 MED ORDER — STROKE: EARLY STAGES OF RECOVERY BOOK
Freq: Once | Status: AC
  Filled 2021-11-08: qty 1

## 2021-11-08 MED ORDER — SODIUM CHLORIDE 0.9 % IV SOLN: Freq: Once | INTRAVENOUS | Status: AC

## 2021-11-08 MED ORDER — INSULIN ASPART 100 UNIT/ML IJ SOLN
0.0000 [IU] | Freq: Three times a day (TID) | INTRAMUSCULAR | Status: DC
  Administered 2021-11-09 (×2): 5 [IU] via SUBCUTANEOUS
  Administered 2021-11-09: 3 [IU] via SUBCUTANEOUS
  Administered 2021-11-10: 5 [IU] via SUBCUTANEOUS
  Administered 2021-11-10: 8 [IU] via SUBCUTANEOUS

## 2021-11-08 MED ORDER — INSULIN ASPART 100 UNIT/ML IJ SOLN
0.0000 [IU] | Freq: Every day | INTRAMUSCULAR | Status: DC
  Administered 2021-11-08: 4 [IU] via SUBCUTANEOUS

## 2021-11-08 MED ORDER — ACETAMINOPHEN 325 MG PO TABS: 650.0000 mg | ORAL_TABLET | ORAL | Status: DC | PRN

## 2021-11-08 NOTE — ED Notes (Signed)
Provider at bedside assessing patient's symptoms

## 2021-11-08 NOTE — Progress Notes (Signed)
  X-cover Note: Pt wants to change her code status to FULL CODE.  Carollee Herter, DO [facility]

## 2021-11-08 NOTE — Consult Note (Signed)
NEUROLOGY CONSULTATION NOTE   Date of service: November 08, 2021 Patient Name: Samantha [name] MRN:  [id number] DOB:  Dec 12, 1951 Reason for consult: "Vertigo and L sided numbness" Requesting Provider: Jonnie Finner, DO _ _ _   _ __   _ __ _ _  __ __   _ __   __ _  History of Present Illness  Samantha [name] is a 70 y.o. female with PMH significant for hx of hyperlipidemia, hypertension that is poorly controlled, peripheral arterial disease, diabetes that is poorly controlled who presents with sudden onset vertigo along with numbness in the left side of her body and incoordination in her left lower extremity.  Patient reports that she got up in the morning on 11/06/2021 and was feeling absolutely fine.  She reports that around 8 AM she had sudden onset vertigo along with her entire left upper extremity being numb and her left lower extremity being very wobbly and incoordinated.  mRS: 0 LKW: 0800 11/06/21. TNKase/thrombectomy: Outside the window for both.  NIHSS components Score: Comment  1a Level of Conscious 0[x]  1[]  2[]  3[]      1b LOC Questions 0[x]  1[]  2[]       1c LOC Commands 0[x]  1[]  2[]       2 Best Gaze 0[x]  1[]  2[]       3 Visual 0[x]  1[]  2[]  3[]      4 Facial Palsy 0[x]  1[]  2[]  3[]      5a Motor Arm - left 0[x]  1[]  2[]  3[]  4[]  UN[]    5b Motor Arm - Right 0[x]  1[]  2[]  3[]  4[]  UN[]    6a Motor Leg - Left 0[x]  1[]  2[]  3[]  4[]  UN[]    6b Motor Leg - Right 0[x]  1[]  2[]  3[]  4[]  UN[]    7 Limb Ataxia 0[]  1[x]  2[]  3[]  UN[]     8 Sensory 0[x]  1[]  2[]  UN[]      9 Best Language 0[x]  1[]  2[]  3[]      10 Dysarthria 0[x]  1[]  2[]  UN[]      11 Extinct. and Inattention 0[x]  1[]  2[]       TOTAL:       ROS   Constitutional Denies weight loss, fever and chills.   HEENT Denies changes in vision and hearing.   Respiratory Denies SOB and cough.   CV Denies palpitations and CP   GI Denies abdominal pain, nausea, vomiting and diarrhea.   GU Denies dysuria and urinary frequency.   MSK Denies  myalgia and joint pain.   Skin Denies rash and pruritus.   Neurological Denies headache and syncope.   Psychiatric Denies recent changes in mood. Denies anxiety and depression.    Past History   Past Medical History:  Diagnosis Date   Acute pyelonephritis    Bacteremia, escherichia coli 01/12/2015   Chest pain    Critical lower limb ischemia (HCC)    Hyperlipidemia    Hypertension    Left carotid bruit    PAD (peripheral artery disease) ([place])    Type II diabetes mellitus ([place])    Past Surgical History:  Procedure Laterality Date   ABDOMINAL ANGIOGRAM  05/16/2015   Procedure: Abdominal Angiogram;  Surgeon: Lorretta Harp, MD;  Location: [facility];  Service: Cardiovascular;;   ABDOMINAL HYSTERECTOMY  ~ 2000   PERIPHERAL VASCULAR CATHETERIZATION Bilateral 05/16/2015   Procedure: Lower Extremity Angiography;  Surgeon: Lorretta Harp, MD; renal arteries widely patent, L-SFA 75%, L-pop 95%, L-peroneal 90%; R-SFA 40%, R-pop 50%, R-prox peroneal 90%; directional atherectomy L-SFA, p-pop, drug-eluting balloon angioplasty reducing the  stenoses to 0, small linear dissection was not flow limiting       PERIPHERAL VASCULAR CATHETERIZATION Left 05/16/2015   Procedure: Peripheral Vascular Atherectomy;  Surgeon: Runell Gess, MD;  Location: [facility];  Service: Cardiovascular;  Laterality: Left;  sfa   Family History  Problem Relation Age of Onset   Hypertension Mother    Social History   Socioeconomic History   Marital status: Widowed    Spouse name: Not on file   Number of children: Not on file   Years of education: Not on file   Highest education level: Not on file  Occupational History   Not on file  Tobacco Use   Smoking status: Never   Smokeless tobacco: Never  Substance and Sexual Activity   Alcohol use: Yes    Alcohol/week: 1.0 - 2.0 standard drink    Types: 1 - 2 Cans of beer per week    Comment: occas   Drug use: No   Sexual activity: Never  Other  Topics Concern   Not on file  Social History Narrative   Not on file   Social Determinants of Health   Financial Resource Strain: Not on file  Food Insecurity: Not on file  Transportation Needs: Not on file  Physical Activity: Not on file  Stress: Not on file  Social Connections: Not on file   No Known Allergies  Medications   Medications Prior to Admission  Medication Sig Dispense Refill Last Dose   atorvastatin (LIPITOR) 40 MG tablet Take 1 tablet (40 mg total) by mouth daily. (Patient taking differently: Take 40 mg by mouth at bedtime.) 90 tablet 1 11/07/2021   carvedilol (COREG) 25 MG tablet Take 1 tablet (25 mg total) by mouth 2 (two) times daily with a meal. 180 tablet 1 11/08/2021 at 0800   insulin isophane & regular human KwikPen (HUMULIN 70/30 KWIKPEN) (70-30) 100 UNIT/ML KwikPen Inject subcutaneously twice daily 40 units in the morning and 25 units in the evening (Patient taking differently: Inject 25 Units into the skin 2 (two) times daily.) 90 mL 6 11/08/2021   losartan (COZAAR) 100 MG tablet TAKE 1 TABLET BY MOUTH DAILY. (Patient taking differently: Take 100 mg by mouth daily.) 90 tablet 1 11/08/2021   metFORMIN (GLUCOPHAGE) 1000 MG tablet Take 1 tablet (1,000 mg total) by mouth 2 (two) times daily with a meal. 180 tablet 1 11/08/2021   amLODipine (NORVASC) 10 MG tablet Take 1 tablet (10 mg total) by mouth daily. (Patient not taking: No sig reported) 90 tablet 1 Not Taking   aspirin 81 MG EC tablet Take 1 tablet (81 mg total) by mouth daily. (Patient not taking: No sig reported) 30 tablet 3 Not Taking   Blood Glucose Monitoring Suppl (TRUE METRIX METER) DEVI 1 each by Does not apply route 3 (three) times daily before meals. 1 Device 0    FLUoxetine (PROZAC) 20 MG tablet Take 2 tablets (40 mg total) by mouth daily. (Patient not taking: No sig reported) 180 tablet 1 Not Taking   gabapentin (NEURONTIN) 300 MG capsule TAKE 1 CAPSULE BY MOUTH 3 TIMES DAILY. (Patient not taking:  No sig reported) 270 capsule 1 Not Taking   glimepiride (AMARYL) 4 MG tablet Take 1 tablet (4 mg total) by mouth daily with breakfast. (Patient not taking: No sig reported) 90 tablet 1 Not Taking   glucose blood test strip Use 3 times daily before meals 100 each 12    Insulin Pen Needle (TRUEPLUS PEN  NEEDLES) 31G X 6 MM MISC Inject 1 each into the skin 2 (two) times daily. 100 each 12    Insulin Syringe-Needle U-100 (BD INSULIN SYRINGE ULTRAFINE) 31G X 15/64" 0.5 ML MISC Use subcutaneously twice daily 60 each 12    pneumococcal 13-valent conjugate vaccine (PREVNAR 13) SUSP injection Inject 0.5 mLs into the muscle as directed. 0.5 mL 0    TRUEPLUS LANCETS 28G MISC 1 each by Does not apply route 3 (three) times daily before meals. 100 each 12      Vitals   Vitals:   11/08/21 1200 11/08/21 1230 11/08/21 1430 11/08/21 1740  BP: (!) 171/75 (!) 157/81 (!) 168/90 (!) 186/78  Pulse: 66 70 70 65  Resp: 16 (!) 22 14 17   Temp:      TempSrc:      SpO2: 95% 96% 97% 98%     There is no height or weight on file to calculate BMI.  Physical Exam   General: Laying comfortably in bed; in no acute distress.  HENT: Normal oropharynx and mucosa. Normal external appearance of ears and nose.  Neck: Supple, no pain or tenderness  CV: No JVD. No peripheral edema.  Pulmonary: Symmetric Chest rise. Normal respiratory effort.  Abdomen: Soft to touch, non-tender.  Ext: No cyanosis, edema, or deformity  Skin: No rash. Normal palpation of skin.   Musculoskeletal: Normal digits and nails by inspection. No clubbing.   Neurologic Examination  Mental status/Cognition: Alert, oriented to self, place, month and year, good attention.  Speech/language: Fluent, comprehension intact, object naming intact, repetition intact.  Cranial nerves:   CN II Pupils equal and reactive to light, no VF deficits    CN III,IV,VI EOM intact, no gaze preference or deviation, no nystagmus    CN V normal sensation in V1, V2, and V3  segments bilaterally    CN VII no asymmetry, no nasolabial fold flattening    CN VIII normal hearing to speech    CN IX & X normal palatal elevation, no uvular deviation    CN XI 5/5 head turn and 5/5 shoulder shrug bilaterally    CN XII midline tongue protrusion    Motor:  Muscle bulk: normal, tone normal, pronator drift none tremor Yes, action tremor BL Mvmt Root Nerve  Muscle Right Left Comments  SA C5/6 Ax Deltoid 5 5   EF C5/6 Mc Biceps 5 5   EE C6/7/8 Rad Triceps 5 5   WF C6/7 Med FCR     WE C7/8 PIN ECU     F Ab C8/T1 U ADM/FDI 5 5   HF L1/2/3 Fem Illopsoas 5 5   KE L2/3/4 Fem Quad 5 5   DF L4/5 D Peron Tib Ant 5 5   PF S1/2 Tibial Grc/Sol 5 5    Reflexes:  Right Left Comments  Pectoralis      Biceps (C5/6) 2 2   Brachioradialis (C5/6) 2 2    Triceps (C6/7) 2 2    Patellar (L3/4) 2 2    Achilles (S1)      Hoffman      Plantar     Jaw jerk    Sensation:  Light touch Intact bilaterally   Pin prick    Temperature    Vibration   Proprioception    Coordination/Complex Motor:  - Finger to Nose with ataxia that is most notable in left upper extremity with some action tremors bilaterally - Heel to shin ataxic and left lower extremity - Rapid alternating  movement slowed in left upper extremity - Gait: Deferred due to ataxia. Labs   CBC:  Recent Labs  Lab 11/08/21 0935 11/08/21 1119  WBC 9.5  --   NEUTROABS 7.2  --   HGB 13.7 11.2*  HCT 38.6 33.0*  MCV 92.1  --   PLT 377  --     Basic Metabolic Panel:  Lab Results  Component Value Date   NA 141 11/08/2021   K 3.5 11/08/2021   CO2 21 (L) 11/08/2021   GLUCOSE 188 (H) 11/08/2021   BUN 22 11/08/2021   CREATININE 1.50 (H) 11/08/2021   CALCIUM 9.8 11/08/2021   GFRNONAA 36 (L) 11/08/2021   GFRAA 47 (L) 09/13/2018   Lipid Panel:  Lab Results  Component Value Date   LDLCALC 74 09/13/2018   HgbA1c:  Lab Results  Component Value Date   HGBA1C 11.6 (A) 08/18/2021   Urine Drug Screen: No results  found for: LABOPIA, COCAINSCRNUR, LABBENZ, AMPHETMU, THCU, LABBARB  Alcohol Level     Component Value Date/Time   ETH <10 11/08/2021 0941    CT Head without contrast(personally reviewed): There are no signs of bleeding within the cranium. Low-density foci is seen in the upper left cerebellum and possibly in the left posterior parieto-occipital cortex. Findings suggest acute or chronic ischemia in the left PCA circulation. Neurological evaluation and MRI should be considered.  MRA head(personally reviewed):   1. Severe stenosis versus short-segment occlusion of the distal left M1 MCA with preserved flow related signal and proximal M2 branches. 2. 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 3. Moderate left P2 PCA stenosis. 4. 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   MRA neck(personally reviewed):   1. Multifocal irregularity of the left vertebral artery in the neck with multifocal severe stenosis. Given the relative paucity of atherosclerosis/narrowing in the neck, dissection is a differential consideration. A CTA neck could further evaluate if clinically indicated. 2. Otherwise, no significant (greater than 50%) stenosis in the neck.    MRI(personally reviewed):   1. Acute left cerebellar infarct.  Mild edema without mass effect. 2. Mild-to-moderate chronic microvascular ischemic disease  Impression   Samantha [name] is a 70 y.o. female with PMH significant for hyperlipidemia, hypertension that is poorly controlled, peripheral arterial disease, diabetes that is poorly controlled who presents with sudden onset vertigo along with numbness in the left side of her body and incoordination in her left lower extremity. Found to have an acute L cerebellum stroke along with multifocal multivessel atherosclerotic disease.  Most notably she has multifocal irregularity of the left vertebral artery in the neck  with multifocal severe stenosis and some concern for potential dissection.  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   Secondary Diagnosis: Essential (primary) hypertension and Type 2 diabetes mellitus w/o complications  Recommendations  Plan:  - Frequent Neuro checks per stroke unit protocol - Recommend obtaining TTE - Recommend obtaining Lipid panel with LDL - Please start statin if LDL > 70 - Recommend HbA1c - Antithrombotic - Aspirin 81mg  daily along with plavix 75mg  daily x 21 days followed by Aspirin 81mg  daily alone. - Recommend DVT ppx - SBP goal - permissive hypertension first 24 h < 220/110. Held home meds.  - Recommend Telemetry monitoring for arrythmia - Recommend bedside swallow screen prior to PO intake. - Stroke education booklet - Recommend PT/OT/SLP consult - Will get CT Angio head and Neck given  concern for L vertebral artery dissection on non contrast MR Angio neck.  ______________________________________________________________________  Discussed with Dr. Kristopher Oppenheim over secure chat.  Thank you for the opportunity to take part in the care of this patient. If you have any further questions, please contact the neurology consultation attending.  Signed,  [place] Pager Number [phone] _ _ _   _ __   _ __ _ _  __ __   _ __   __ _

## 2021-11-08 NOTE — Progress Notes (Signed)
   11/08/21 2250  Provider Notification  Provider Name/Title Dr. Imogene Burn  Date Provider Notified 11/08/21  Time Provider Notified 2251  Notification Type Page  Notification Reason Other (Comment) (pt wants her DNR code status change to full code)  Provider response See new orders  Date of Provider Response 11/08/21  Time of Provider Response 2255

## 2021-11-08 NOTE — H&P (Addendum)
History and Physical    Legacy Carrender GBT:[id number] DOB: 08/23/1951 DOA: 11/08/2021  PCP: Hoy Register, MD  Patient coming from: Home  Chief Complaint: dizziness, weakness.  HPI: Samantha [name] is a 70 y.o. female with medical history significant of DM2, HTN, HLD. Her symptoms started 3 days ago with dizziness. It came in waves. She had no specific activity that caused it. She just noticed that she would have episodes of lightheadedness and dizziness that would cause her to have to slow down and collect herself.  She did not have visual or auditory changes at the time. She did not take any specific medicines. She says that she had left arm numbness and spasm starting yesterday. No specific activity brought it no. It also came in short waves. Everything seemed to come together this morning around 8 am. She noticed that she has a sudden loss of strength in her left leg while walking. She had to sit to compose herself. She felt spasms in the left arm and leg. She had numbness in the same areas. She had black spot and floaters. She felt dizzy. She spoke with her daughter and they came to the ED for help. She denies any other aggravating or alleviating factors.    ED Course: CT showed PCA stroke. Neuro was consulted. TRH was called for admission.   Review of Systems:  Denies CP, dyspnea, abdominal pain, N/V/D, fever, sick contacts. Review of systems is otherwise negative for all not mentioned in HPI.   PMHx Past Medical History:  Diagnosis Date   Acute pyelonephritis    Bacteremia, escherichia coli 01/12/2015   Chest pain    Critical lower limb ischemia (HCC)    Hyperlipidemia    Hypertension    Left carotid bruit    PAD (peripheral artery disease) (HCC)    Type II diabetes mellitus (HCC)     PSHx Past Surgical History:  Procedure Laterality Date   ABDOMINAL ANGIOGRAM  05/16/2015   Procedure: Abdominal Angiogram;  Surgeon: Runell Gess, MD;  Location: [facility];   Service: Cardiovascular;;   ABDOMINAL HYSTERECTOMY  ~ 2000   PERIPHERAL VASCULAR CATHETERIZATION Bilateral 05/16/2015   Procedure: Lower Extremity Angiography;  Surgeon: Runell Gess, MD; renal arteries widely patent, L-SFA 75%, L-pop 95%, L-peroneal 90%; R-SFA 40%, R-pop 50%, R-prox peroneal 90%; directional atherectomy L-SFA, p-pop, drug-eluting balloon angioplasty reducing the stenoses to 0, small linear dissection was not flow limiting       PERIPHERAL VASCULAR CATHETERIZATION Left 05/16/2015   Procedure: Peripheral Vascular Atherectomy;  Surgeon: Runell Gess, MD;  Location: [facility];  Service: Cardiovascular;  Laterality: Left;  sfa    SocHx  reports that she has never smoked. She has never used smokeless tobacco. She reports current alcohol use of about 1.0 - 2.0 standard drink per week. She reports that she does not use drugs.  No Known Allergies  FamHx Family History  Problem Relation Age of Onset   Hypertension Mother     Prior to Admission medications   Medication Sig Start Date End Date Taking? Authorizing Provider  atorvastatin (LIPITOR) 40 MG tablet Take 1 tablet (40 mg total) by mouth daily. Patient taking differently: Take 40 mg by mouth at bedtime. 08/18/21  Yes Hoy Register, MD  carvedilol (COREG) 25 MG tablet Take 1 tablet (25 mg total) by mouth 2 (two) times daily with a meal. 08/18/21  Yes Newlin, Enobong, MD  insulin isophane & regular human KwikPen (HUMULIN 70/30 KWIKPEN) (70-30)  100 UNIT/ML KwikPen Inject subcutaneously twice daily 40 units in the morning and 25 units in the evening Patient taking differently: Inject 25 Units into the skin 2 (two) times daily. 08/18/21  Yes Newlin, Odette Horns, MD  losartan (COZAAR) 100 MG tablet TAKE 1 TABLET BY MOUTH DAILY. Patient taking differently: Take 100 mg by mouth daily. 08/18/21  Yes Hoy Register, MD  metFORMIN (GLUCOPHAGE) 1000 MG tablet Take 1 tablet (1,000 mg total) by mouth 2 (two) times daily with a  meal. 08/18/21  Yes Newlin, Enobong, MD  amLODipine (NORVASC) 10 MG tablet Take 1 tablet (10 mg total) by mouth daily. Patient not taking: No sig reported 05/23/19   Hoy Register, MD  aspirin 81 MG EC tablet Take 1 tablet (81 mg total) by mouth daily. Patient not taking: No sig reported 03/23/17   Hoy Register, MD  Blood Glucose Monitoring Suppl (TRUE METRIX METER) DEVI 1 each by Does not apply route 3 (three) times daily before meals. 12/23/16   Hoy Register, MD  FLUoxetine (PROZAC) 20 MG tablet Take 2 tablets (40 mg total) by mouth daily. Patient not taking: No sig reported 05/23/19   Hoy Register, MD  gabapentin (NEURONTIN) 300 MG capsule TAKE 1 CAPSULE BY MOUTH 3 TIMES DAILY. Patient not taking: No sig reported 05/23/19   Hoy Register, MD  glimepiride (AMARYL) 4 MG tablet Take 1 tablet (4 mg total) by mouth daily with breakfast. Patient not taking: No sig reported 12/13/18   Hoy Register, MD  glucose blood test strip Use 3 times daily before meals 07/21/18   Hoy Register, MD  Insulin Pen Needle (TRUEPLUS PEN NEEDLES) 31G X 6 MM MISC Inject 1 each into the skin 2 (two) times daily. 08/19/21   Hoy Register, MD  Insulin Syringe-Needle U-100 (BD INSULIN SYRINGE ULTRAFINE) 31G X 15/64" 0.5 ML MISC Use subcutaneously twice daily 01/28/17   Hoy Register, MD  pneumococcal 13-valent conjugate vaccine (PREVNAR 13) SUSP injection Inject 0.5 mLs into the muscle as directed. 01/30/19   Hoy Register, MD  TRUEPLUS LANCETS 28G MISC 1 each by Does not apply route 3 (three) times daily before meals. 12/23/16   Hoy Register, MD    Physical Exam: Vitals:   11/08/21 1015 11/08/21 1135 11/08/21 1200 11/08/21 1230  BP: (!) 176/80 (!) 166/79 (!) 171/75 (!) 157/81  Pulse: 66 65 66 70  Resp: 20 (!) 23 16 (!) 22  Temp:      TempSrc:      SpO2: 95% 99% 95% 96%    General: 70 y.o. female resting in bed in NAD Eyes: PERRL, normal sclera ENMT: Nares patent w/o discharge, orophaynx clear,  dentition poor, ears w/o discharge/lesions/ulcers Neck: Supple, trachea midline Cardiovascular: RRR, +S1, S2, no m/g/r, equal pulses throughout Respiratory: CTABL, no w/r/r, normal WOB GI: BS+, NDNT, no masses noted, no organomegaly noted MSK: No e/c/c Skin: No rashes, bruises, ulcerations noted Neuro: A&O x 3, no focal deficits Psyc: Appropriate interaction and affect, calm/cooperative  Labs on Admission: I have personally reviewed following labs and imaging studies  CBC: Recent Labs  Lab 11/08/21 0935 11/08/21 1119  WBC 9.5  --   NEUTROABS 7.2  --   HGB 13.7 11.2*  HCT 38.6 33.0*  MCV 92.1  --   PLT 377  --    Basic Metabolic Panel: Recent Labs  Lab 11/08/21 0935 11/08/21 1119  NA 133* 141  K 4.3 3.5  CL 102 107  CO2 21*  --   GLUCOSE 261* 188*  BUN 24* 22  CREATININE 1.57* 1.50*  CALCIUM 9.8  --   MG 1.9  --    GFR: CrCl cannot be calculated (Unknown ideal weight.). Liver Function Tests: Recent Labs  Lab 11/08/21 0935  AST 23  ALT 24  ALKPHOS 82  BILITOT 0.8  PROT 7.5  ALBUMIN 4.2   No results for input(s): LIPASE, AMYLASE in the last 168 hours. No results for input(s): AMMONIA in the last 168 hours. Coagulation Profile: Recent Labs  Lab 11/08/21 0941  INR 1.0   Cardiac Enzymes: No results for input(s): CKTOTAL, CKMB, CKMBINDEX, TROPONINI in the last 168 hours. BNP (last 3 results) No results for input(s): PROBNP in the last 8760 hours. HbA1C: No results for input(s): HGBA1C in the last 72 hours. CBG: No results for input(s): GLUCAP in the last 168 hours. Lipid Profile: No results for input(s): CHOL, HDL, LDLCALC, TRIG, CHOLHDL, LDLDIRECT in the last 72 hours. Thyroid Function Tests: No results for input(s): TSH, T4TOTAL, FREET4, T3FREE, THYROIDAB in the last 72 hours. Anemia Panel: No results for input(s): VITAMINB12, FOLATE, FERRITIN, TIBC, IRON, RETICCTPCT in the last 72 hours. Urine analysis:    Component Value Date/Time   COLORURINE  YELLOW 01/15/2015 1032   APPEARANCEUR CLEAR 01/15/2015 1032   LABSPEC <1.005 (L) 01/15/2015 1032   PHURINE 6.0 01/15/2015 1032   GLUCOSEU NEG 01/15/2015 1032   HGBUR NEG 01/15/2015 1032   BILIRUBINUR negative 08/18/2021 1207   BILIRUBINUR neg 06/28/2017 1508   KETONESUR negative 08/18/2021 1207   KETONESUR neg 06/28/2017 1508   KETONESUR NEG 01/15/2015 1032   PROTEINUR >=300 06/28/2017 1508   PROTEINUR 30 (A) 01/15/2015 1032   UROBILINOGEN 0.2 08/18/2021 1207   UROBILINOGEN 0.2 01/15/2015 1032   NITRITE Negative 08/18/2021 1207   NITRITE neg 06/28/2017 1508   NITRITE NEG 01/15/2015 1032   LEUKOCYTESUR Trace (A) 08/18/2021 1207    Radiological Exams on Admission: CT Head Wo Contrast  Result Date: 11/08/2021 CLINICAL DATA:  Neuro deficits EXAM: CT HEAD WITHOUT CONTRAST TECHNIQUE: Contiguous axial images were obtained from the base of the skull through the vertex without intravenous contrast. COMPARISON:  None. FINDINGS: Brain: Ventricles are not dilated. There is no shift of midline structures. There are no signs of recent bleeding within the cranium. There is low-density in the upper left cerebellum. There is subtle decreased density in the posterior left parieto-occipital cortex. There is decreased density in the periventricular white matter. Cortical sulci are slightly prominent. Vascular: Unremarkable Skull: Unremarkable Sinuses/Orbits: There is mucosal thickening in the ethmoid and maxillary sinuses. Other: There is increased amount of CSF insula suggesting partial empty sella. IMPRESSION: There are no signs of bleeding within the cranium. Low-density foci is seen in the upper left cerebellum and possibly in the left posterior parieto-occipital cortex. Findings suggest acute or chronic ischemia in the left PCA circulation. Neurological evaluation and MRI should be considered. Other findings as described in the body of the report. Imaging findings were relayed to patient's provider Mertha Baars by telephone call at 10:44 a.m. on 11/08/2021. Electronically Signed   By: Ernie Avena M.D.   On: 11/08/2021 10:48    EKG: Independently reviewed. Sinus, no st elevations  Assessment/Plan CVA     - admit to inpt, tele @ [facility]     - Neuro consulted, appreciate assistance     - echo, MRI brain, MRA     - lipids, A1c     - PT/OT/TOC     - swallow screen     -  ASA, statin     - UPDATE: notified of MRI results by radiology; spoke with neuro; ok to start plavix, ASA  DM2     - after passing swallow screen: SSI, DM diet, A1c, glucose checks  HTN     - allow for permissive HTN for now  HLD     - atorvastatin  DVT prophylaxis: SCDs  Code Status: DNR  (confirmed through interpreter w/ multiple variations of these questions) Family Communication: w/ dtr at bedside  Consults called: EDP spoke with neurology   Status is: Inpatient  Remains inpatient appropriate because: severity of illness  Teddy Spike DO [facility]  If 7PM-7AM, please contact night-coverage [email or web address]  11/08/2021, 1:59 PM

## 2021-11-08 NOTE — ED Provider Notes (Signed)
[facility]-EMERGENCY DEPT Provider Note   CSN: [id number] Arrival date & time: 11/08/21  4098     History Chief Complaint  Patient presents with   Numbness    Samantha [name] is a 70 y.o. female.  With past medical history of type 2 diabetes, hyperlipidemia, hypertension who presents emergency department with numbness.  Last known well last night at unknown time.  Per the daughter at bedside who provides most of the history the patient has been feeling unwell for about 3 days.  States that this morning she woke up went to work in the patient called her stating that she had a headache and numbness in her left side.  She states that she was unable to walk this morning which was new for her.  Daughter states that she took patient's blood pressure this morning which was high.  She states that there was some abnormality/twitching/odd movement of her tongue.  She states that she gave her her blood pressure medication and tongue symptoms subsided.  However patient is still complaining of black spots in her eyes, numbness to the left side of her face, arm, leg.  She does state that she gets intermittent numbness on her left leg from time to time when she gets out of bed and walks.   Daughter also states that she has poorly controlled type 2 diabetes with elevated blood sugars over the past week.  She denies her recently being sick, fevers, cough, chest pain, shortness of breath, syncope, lightheadedness or dizziness.  HPI     Past Medical History:  Diagnosis Date   Acute pyelonephritis    Bacteremia, escherichia coli 01/12/2015   Chest pain    Critical lower limb ischemia (HCC)    Hyperlipidemia    Hypertension    Left carotid bruit    PAD (peripheral artery disease) (HCC)    Type II diabetes mellitus (HCC)     Patient Active Problem List   Diagnosis Date Noted   Anxiety 04/27/2017   Non compliance w medication regimen 12/23/2016   History of ulceration  03/30/2016   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   Type 2 diabetes mellitus with complication, with long-term current use of insulin (HCC) 02/06/2016   Healthcare maintenance 02/06/2016   Critical lower limb ischemia (HCC) 05/16/2015   Hyperlipidemia 04/12/2015   Depression 04/04/2015   PAD (peripheral artery disease) (HCC) 04/04/2015   Diabetic ulcer of left foot associated with type 2 diabetes mellitus (HCC) 02/25/2015   Diabetic neuropathy, painful (HCC) 02/25/2015   Major depressive disorder, recurrent episode, moderate (HCC) 02/25/2015   Essential hypertension 01/13/2015   Protein-calorie malnutrition, severe (HCC) 01/11/2015   Diabetic toe ulcer (HCC) 01/10/2015   Uncontrolled diabetes mellitus 01/10/2015    Past Surgical History:  Procedure Laterality Date   ABDOMINAL ANGIOGRAM  05/16/2015   Procedure: Abdominal Angiogram;  Surgeon: Runell Gess, MD;  Location: [facility];  Service: Cardiovascular;;   ABDOMINAL HYSTERECTOMY  ~ 2000   PERIPHERAL VASCULAR CATHETERIZATION Bilateral 05/16/2015   Procedure: Lower Extremity Angiography;  Surgeon: Runell Gess, MD; renal arteries widely patent, L-SFA 75%, L-pop 95%, L-peroneal 90%; R-SFA 40%, R-pop 50%, R-prox peroneal 90%; directional atherectomy L-SFA, p-pop, drug-eluting balloon angioplasty reducing the stenoses to 0, small linear dissection was not flow limiting       PERIPHERAL VASCULAR CATHETERIZATION Left 05/16/2015   Procedure: Peripheral Vascular Atherectomy;  Surgeon: Runell Gess, MD;  Location: [facility];  Service:  Cardiovascular;  Laterality: Left;  sfa     OB History   No obstetric history on file.     Family History  Problem Relation Age of Onset   Hypertension Mother     Social History   Tobacco Use   Smoking status: Never   Smokeless tobacco: Never  Substance Use Topics   Alcohol use: Yes    Alcohol/week: 1.0 - 2.0 standard drink    Types:  1 - 2 Cans of beer per week    Comment: occas   Drug use: No    Home Medications Prior to Admission medications   Medication Sig Start Date End Date Taking? Authorizing Provider  amLODipine (NORVASC) 10 MG tablet Take 1 tablet (10 mg total) by mouth daily. 05/23/19   Hoy Register, MD  aspirin 81 MG EC tablet Take 1 tablet (81 mg total) by mouth daily. 03/23/17   Hoy Register, MD  atorvastatin (LIPITOR) 40 MG tablet Take 1 tablet (40 mg total) by mouth daily. 08/18/21   Hoy Register, MD  Blood Glucose Monitoring Suppl (TRUE METRIX METER) DEVI 1 each by Does not apply route 3 (three) times daily before meals. 12/23/16   Hoy Register, MD  carvedilol (COREG) 25 MG tablet Take 1 tablet (25 mg total) by mouth 2 (two) times daily with a meal. 08/18/21   Hoy Register, MD  FLUoxetine (PROZAC) 20 MG tablet Take 2 tablets (40 mg total) by mouth daily. 05/23/19   Hoy Register, MD  gabapentin (NEURONTIN) 300 MG capsule TAKE 1 CAPSULE BY MOUTH 3 TIMES DAILY. 05/23/19   Hoy Register, MD  glimepiride (AMARYL) 4 MG tablet Take 1 tablet (4 mg total) by mouth daily with breakfast. 12/13/18   Hoy Register, MD  glucose blood test strip Use 3 times daily before meals 07/21/18   Hoy Register, MD  ibuprofen (ADVIL,MOTRIN) 200 MG tablet Take 400 mg by mouth every 6 (six) hours as needed (pain/fever). Reported on 06/10/2016    [provider]  insulin isophane & regular human KwikPen (HUMULIN 70/30 KWIKPEN) (70-30) 100 UNIT/ML KwikPen Inject subcutaneously twice daily 40 units in the morning and 25 units in the evening 08/18/21   Hoy Register, MD  Insulin Pen Needle (TRUEPLUS PEN NEEDLES) 31G X 6 MM MISC Inject 1 each into the skin 2 (two) times daily. 08/19/21   Hoy Register, MD  Insulin Syringe-Needle U-100 (BD INSULIN SYRINGE ULTRAFINE) 31G X 15/64" 0.5 ML MISC Use subcutaneously twice daily 01/28/17   Hoy Register, MD  losartan (COZAAR) 100 MG tablet TAKE 1 TABLET BY MOUTH DAILY.  08/18/21   Hoy Register, MD  metFORMIN (GLUCOPHAGE) 1000 MG tablet Take 1 tablet (1,000 mg total) by mouth 2 (two) times daily with a meal. 08/18/21   Hoy Register, MD  pneumococcal 13-valent conjugate vaccine (PREVNAR 13) SUSP injection Inject 0.5 mLs into the muscle as directed. 01/30/19   Hoy Register, MD  TRUEPLUS LANCETS 28G MISC 1 each by Does not apply route 3 (three) times daily before meals. 12/23/16   Hoy Register, MD    Allergies    Patient has no known allergies.  Review of Systems   Review of Systems  Constitutional:  Negative for fever.  Respiratory:  Negative for shortness of breath.   Cardiovascular:  Negative for chest pain.  Gastrointestinal:  Negative for abdominal pain.  Musculoskeletal:  Positive for gait problem.  Neurological:  Positive for weakness, numbness and headaches. Negative for dizziness, syncope, facial asymmetry and light-headedness.  All other systems  reviewed and are negative.  Physical Exam Updated Vital Signs BP (!) 157/81   Pulse 70   Temp 98.2 F (36.8 C) (Oral)   Resp (!) 22   SpO2 96%   Physical Exam Vitals and nursing note reviewed.  Constitutional:      General: She is not in acute distress.    Appearance: Normal appearance. She is not toxic-appearing.  HENT:     Head: Normocephalic and atraumatic.     Nose: Nose normal.     Mouth/Throat:     Mouth: Mucous membranes are moist.     Pharynx: Oropharynx is clear.  Eyes:     General: No scleral icterus.    Extraocular Movements: Extraocular movements intact.     Pupils: Pupils are equal, round, and reactive to light.  Cardiovascular:     Rate and Rhythm: Normal rate and regular rhythm.     Pulses: Normal pulses.     Heart sounds: No murmur heard. Pulmonary:     Effort: Pulmonary effort is normal. No respiratory distress.     Breath sounds: Normal breath sounds.  Abdominal:     General: Bowel sounds are normal.     Palpations: Abdomen is soft.     Tenderness: There is  no abdominal tenderness.  Musculoskeletal:        General: Normal range of motion.     Cervical back: Normal range of motion and neck supple.  Skin:    General: Skin is warm and dry.     Capillary Refill: Capillary refill takes less than 2 seconds.  Neurological:     General: No focal deficit present.     Mental Status: She is alert and oriented to person, place, and time.     GCS: GCS eye subscore is 4. GCS verbal subscore is 5. GCS motor subscore is 6.     Cranial Nerves: Cranial nerves 2-12 are intact. No cranial nerve deficit, dysarthria or facial asymmetry.     Sensory: No sensory deficit.     Motor: Weakness and tremor present. No pronator drift.     Gait: Gait abnormal.  Psychiatric:        Mood and Affect: Mood normal.        Behavior: Behavior normal.        Thought Content: Thought content normal.        Judgment: Judgment normal.    ED Results / Procedures / Treatments   Labs (all labs ordered are listed, but only abnormal results are displayed) Labs Reviewed  BLOOD GAS, VENOUS - Abnormal; Notable for the following components:      Result Value   pH, Ven 7.473 (*)    pCO2, Ven 31.9 (*)    All other components within normal limits  COMPREHENSIVE METABOLIC PANEL - Abnormal; Notable for the following components:   Sodium 133 (*)    CO2 21 (*)    Glucose, Bld 261 (*)    BUN 24 (*)    Creatinine, Ser 1.57 (*)    GFR, Estimated 36 (*)    All other components within normal limits  I-STAT CHEM 8, ED - Abnormal; Notable for the following components:   Creatinine, Ser 1.50 (*)    Glucose, Bld 188 (*)    Calcium, Ion 1.10 (*)    TCO2 20 (*)    Hemoglobin 11.2 (*)    HCT 33.0 (*)    All other components within normal limits  RESP PANEL BY RT-PCR (FLU A&B, COVID) ARPGX2  CBC WITH DIFFERENTIAL/PLATELET  MAGNESIUM  ETHANOL  PROTIME-INR  APTT  RAPID URINE DRUG SCREEN, HOSP PERFORMED  URINALYSIS, ROUTINE W REFLEX MICROSCOPIC  TROPONIN I (HIGH SENSITIVITY)  TROPONIN I  (HIGH SENSITIVITY)    EKG EKG Interpretation  Date/Time:  Saturday November 08 2021 09:37:40 EST Ventricular Rate:  71 PR Interval:  186 QRS Duration: 81 QT Interval:  386 QTC Calculation: 420 R Axis:   -6 Text Interpretation: Sinus rhythm Low voltage, precordial leads No significant change since last tracing Confirmed by Gwyneth Sprout ([id number]) on 11/08/2021 9:40:12 AM  Radiology CT Head Wo Contrast  Result Date: 11/08/2021 CLINICAL DATA:  Neuro deficits EXAM: CT HEAD WITHOUT CONTRAST TECHNIQUE: Contiguous axial images were obtained from the base of the skull through the vertex without intravenous contrast. COMPARISON:  None. FINDINGS: Brain: Ventricles are not dilated. There is no shift of midline structures. There are no signs of recent bleeding within the cranium. There is low-density in the upper left cerebellum. There is subtle decreased density in the posterior left parieto-occipital cortex. There is decreased density in the periventricular white matter. Cortical sulci are slightly prominent. Vascular: Unremarkable Skull: Unremarkable Sinuses/Orbits: There is mucosal thickening in the ethmoid and maxillary sinuses. Other: There is increased amount of CSF insula suggesting partial empty sella. IMPRESSION: There are no signs of bleeding within the cranium. Low-density foci is seen in the upper left cerebellum and possibly in the left posterior parieto-occipital cortex. Findings suggest acute or chronic ischemia in the left PCA circulation. Neurological evaluation and MRI should be considered. Other findings as described in the body of the report. Imaging findings were relayed to patient's provider Mertha Baars by telephone call at 10:44 a.m. on 11/08/2021. Electronically Signed   By: Ernie Avena M.D.   On: 11/08/2021 10:48    Procedures Procedures   Medications Ordered in ED Medications - No data to display  ED Course  I have reviewed the triage vital signs and the nursing  notes.  Pertinent labs & imaging results that were available during my care of the patient were reviewed by me and considered in my medical decision making (see chart for details).  1050: poke with radiologist Dr. Glynis Smiles who states patient has low density foci in the left cerebellum, left parieto-occipital cortex of unknown chronicity.  Will obtain MRI brain. -Consult to Neurology placed   1105: Spoke with Dr. Iver Nestle with neurology who requests MRA head and neck to evaluate CT findings.  She also advises permissive hypertension up to 220/120. MDM Rules/Calculators/A&P 70 year old female who presents emergency department with left-sided numbness and gait disturbance.  Code stroke not initiated as last known well was early yesterday. Initial CT head shows likely acute infarct of the left cerebellum and left parietal occipital cortex.  Radiology requests MRI. Dr. Iver Nestle with neurology consulted who recommends MRA of head and neck in addition to MRI brain.  Orders have been placed. CMP with creatinine of 1.57 which appears to be slowly increasing over the past few years. Troponin negative, EKG without ischemia or infarction, doubt ACS as confounding factor. COVID/flu negative Patient initially hypertensive to 211 over 80s.  Dr. But got requesting that we allow permissive hypertension up to 220/120 while in the emergency department.  Spoke with Dr. Ronaldo Miyamoto, hospitalist, who agrees to admit the patient to [facility] for continued work-up. Final Clinical Impression(s) / ED Diagnoses Final diagnoses:  Numbness    Rx / DC Orders ED Discharge Orders     None  Cristopher Peru, PA-C 11/08/21 1413    Gwyneth Sprout, MD 11/10/21 [id number]

## 2021-11-08 NOTE — Progress Notes (Signed)
Pt admitted from [facility], alert and oriented, spanish speaking, daughter at bedside, settled in bed with call light within pt's reach, tele monitor put and later verified on pt, admit Doc and Neurologist on call also paged and notified of pt's arrival, was however reassured and will continue to monitor, safety concern initiated as well. Obasogie-Asidi, Anthem Frazer Efe

## 2021-11-08 NOTE — ED Triage Notes (Signed)
Pt arrives with daughter. Pt c/o waking up with a headache and then about 40 mins ago she began having numbness and weakness to left arm, left leg, and left side of face.

## 2021-11-09 ENCOUNTER — Inpatient Hospital Stay (HOSPITAL_COMMUNITY): Payer: Self-pay

## 2021-11-09 DIAGNOSIS — I679 Cerebrovascular disease, unspecified: Secondary | ICD-10-CM

## 2021-11-09 DIAGNOSIS — Z794 Long term (current) use of insulin: Secondary | ICD-10-CM

## 2021-11-09 DIAGNOSIS — E7800 Pure hypercholesterolemia, unspecified: Secondary | ICD-10-CM

## 2021-11-09 DIAGNOSIS — I6322 Cerebral infarction due to unspecified occlusion or stenosis of basilar arteries: Secondary | ICD-10-CM

## 2021-11-09 DIAGNOSIS — I6389 Other cerebral infarction: Secondary | ICD-10-CM

## 2021-11-09 DIAGNOSIS — E1140 Type 2 diabetes mellitus with diabetic neuropathy, unspecified: Secondary | ICD-10-CM

## 2021-11-09 DIAGNOSIS — I10 Essential (primary) hypertension: Secondary | ICD-10-CM

## 2021-11-09 DIAGNOSIS — E11649 Type 2 diabetes mellitus with hypoglycemia without coma: Secondary | ICD-10-CM

## 2021-11-09 LAB — LIPID PANEL
Cholesterol: 134 mg/dL (ref 0–200)
HDL: 33 mg/dL — ABNORMAL LOW (ref >40)
LDL Cholesterol: 67 mg/dL (ref 0–99)
Total CHOL/HDL Ratio: 4.1 RATIO
Triglycerides: 168 mg/dL — ABNORMAL HIGH (ref <150)
VLDL: 34 mg/dL (ref 0–40)

## 2021-11-09 LAB — URINALYSIS, ROUTINE W REFLEX MICROSCOPIC
Bilirubin Urine: NEGATIVE
Glucose, UA: 150 mg/dL — AB
Hgb urine dipstick: NEGATIVE
Ketones, ur: NEGATIVE mg/dL
Nitrite: NEGATIVE
Protein, ur: 100 mg/dL — AB
Specific Gravity, Urine: 1.014 (ref 1.005–1.030)
WBC, UA: >50 WBC/hpf — ABNORMAL HIGH (ref 0–5)
pH: 6 (ref 5.0–8.0)

## 2021-11-09 LAB — GLUCOSE, CAPILLARY
Glucose-Capillary: 161 mg/dL — ABNORMAL HIGH (ref 70–99)
Glucose-Capillary: 237 mg/dL — ABNORMAL HIGH (ref 70–99)
Glucose-Capillary: 231 mg/dL — ABNORMAL HIGH (ref 70–99)
Glucose-Capillary: 177 mg/dL — ABNORMAL HIGH (ref 70–99)

## 2021-11-09 LAB — RAPID URINE DRUG SCREEN, HOSP PERFORMED
Amphetamines: NOT DETECTED
Barbiturates: NOT DETECTED
Benzodiazepines: NOT DETECTED
Cocaine: NOT DETECTED
Opiates: NOT DETECTED
Tetrahydrocannabinol: NOT DETECTED

## 2021-11-09 LAB — ECHOCARDIOGRAM COMPLETE
Area-P 1/2: 2.39 cm2
Height: 64 in
S' Lateral: 3.2 cm
Weight: 2578.5 oz

## 2021-11-09 LAB — HIV ANTIBODY (ROUTINE TESTING W REFLEX): HIV Screen 4th Generation wRfx: NONREACTIVE

## 2021-11-09 LAB — HEMOGLOBIN A1C
Hgb A1c MFr Bld: 8.9 % — ABNORMAL HIGH (ref 4.8–5.6)
Mean Plasma Glucose: 208.73 mg/dL

## 2021-11-09 MED ORDER — CLOPIDOGREL BISULFATE 75 MG PO TABS
75.0000 mg | ORAL_TABLET | Freq: Every day | ORAL | Status: DC
  Administered 2021-11-09 – 2021-11-10 (×2): 75 mg via ORAL
  Filled 2021-11-09 (×2): qty 1

## 2021-11-09 MED ORDER — AMLODIPINE BESYLATE 10 MG PO TABS
10.0000 mg | ORAL_TABLET | Freq: Every day | ORAL | Status: DC
  Administered 2021-11-10 (×2): 10 mg via ORAL
  Filled 2021-11-09 (×2): qty 1

## 2021-11-09 MED ORDER — PERFLUTREN LIPID MICROSPHERE
1.0000 mL | INTRAVENOUS | Status: AC | PRN
Start: 2021-11-09 — End: 2021-11-09
  Administered 2021-11-09: 2 mL via INTRAVENOUS
  Filled 2021-11-09: qty 10

## 2021-11-09 MED ORDER — IOHEXOL 350 MG/ML SOLN
50.0000 mL | Freq: Once | INTRAVENOUS | Status: AC | PRN
  Administered 2021-11-09: 50 mL via INTRAVENOUS

## 2021-11-09 NOTE — Evaluation (Addendum)
Speech Language Pathology Evaluation Patient Details Name: Breleigh Carpino MRN: [id number] DOB: 09-Jul-1951 Today's Date: 11/09/2021 Time: 6659-9357 SLP Time Calculation (min) (ACUTE ONLY): 23 min  Problem List:  Patient Active Problem List   Diagnosis Date Noted   CVA (cerebral vascular accident) (HCC) 11/08/2021   Anxiety 04/27/2017   Non compliance w medication regimen 12/23/2016   History of ulceration 03/30/2016   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   Type 2 diabetes mellitus with complication, with long-term current use of insulin (HCC) 02/06/2016   Healthcare maintenance 02/06/2016   Critical lower limb ischemia (HCC) 05/16/2015   Hyperlipidemia 04/12/2015   Depression 04/04/2015   PAD (peripheral artery disease) (HCC) 04/04/2015   Diabetic ulcer of left foot associated with type 2 diabetes mellitus (HCC) 02/25/2015   Diabetic neuropathy, painful (HCC) 02/25/2015   Major depressive disorder, recurrent episode, moderate (HCC) 02/25/2015   Essential hypertension 01/13/2015   Protein-calorie malnutrition, severe (HCC) 01/11/2015   Diabetic toe ulcer (HCC) 01/10/2015   Uncontrolled diabetes mellitus 01/10/2015   Past Medical History:  Past Medical History:  Diagnosis Date   Acute pyelonephritis    Bacteremia, escherichia coli 01/12/2015   Chest pain    Critical lower limb ischemia (HCC)    Hyperlipidemia    Hypertension    Left carotid bruit    PAD (peripheral artery disease) (HCC)    Type II diabetes mellitus (HCC)    Past Surgical History:  Past Surgical History:  Procedure Laterality Date   ABDOMINAL ANGIOGRAM  05/16/2015   Procedure: Abdominal Angiogram;  Surgeon: Runell Gess, MD;  Location: [facility];  Service: Cardiovascular;;   ABDOMINAL HYSTERECTOMY  ~ 2000   PERIPHERAL VASCULAR CATHETERIZATION Bilateral 05/16/2015   Procedure: Lower Extremity Angiography;  Surgeon: Runell Gess, MD; renal arteries  widely patent, L-SFA 75%, L-pop 95%, L-peroneal 90%; R-SFA 40%, R-pop 50%, R-prox peroneal 90%; directional atherectomy L-SFA, p-pop, drug-eluting balloon angioplasty reducing the stenoses to 0, small linear dissection was not flow limiting       PERIPHERAL VASCULAR CATHETERIZATION Left 05/16/2015   Procedure: Peripheral Vascular Atherectomy;  Surgeon: Runell Gess, MD;  Location: [facility];  Service: Cardiovascular;  Laterality: Left;  sfa   HPI:  70 y.o. female with medical history significant of DM2, HTN, HLD. Her symptoms started 3 days ago (11/06/21) with dizziness. It came in waves. She had no specific activity that caused it. She just noticed that she would have episodes of lightheadedness and dizziness that would cause her to have to slow down and collect herself.  She did not have visual or auditory changes at the time. She did not take any specific medicines. She says that she had left arm numbness and spasm starting beginning on 11/08/21. No specific activity brought it on. It also came in short waves. Everything seemed to come together this morning around 8 am. She noticed that she has a sudden loss of strength in her left leg while walking. She had to sit to compose herself. She felt spasms in the left arm and leg. She had numbness in the same areas. She had black spot and floaters. She felt dizzy. She spoke with her daughter and they came to the ED for help; MRI 11/08/21 indicated  Acute left cerebellar infarct.  Mild edema without mass effect; Passed Yale swallow screen; BSE and speech/language/cognitive evaluations generated   Assessment / Plan / Recommendation Clinical Impression  Pt assessed via informal means and with portions  of SLUMS ([employer] Mental Status Examination) with daughter's assistance for interpretation of evaluation instructions.  Pt able to follow directives for multi-step directives and answering questions accurately.  Pt oriented x4.  Pt's speech  clarity adequate for simple conversation and 100% intelligible.  Pt able to read environmental signs in room and pamphlet re: CVA with comprehension accurate.  Graphic expression not assessed.  Attention tasks unremarkable.  Memory was not formally assessed, but pt/daughter deny difficulty in this area.  Discussed if issues arise once pt is discharged, family can refer to ST to f/u in next venue.  At baseline, pt was able to perform most of her ADLs, but lives with her daughter (not one present for evaluation).   ST will s/o in this setting as pt appears to be functioning at a baseline level.  Thank you for this consult.    SLP Assessment  SLP Recommendation/Assessment: Patient does not need any further Speech Language Pathology Services SLP Visit Diagnosis: Cognitive communication deficit (R41.841)    Recommendations for follow up therapy are one component of a multi-disciplinary discharge planning process, led by the attending physician.  Recommendations may be updated based on patient status, additional functional criteria and insurance authorization.    Follow Up Recommendations  No SLP follow up    Assistance Recommended at Discharge   (TBD)  Functional Status Assessment    Frequency and Duration  (Evaluation only)         SLP Evaluation Cognition  Overall Cognitive Status: Within Functional Limits for tasks assessed Arousal/Alertness: Awake/alert Orientation Level: Oriented X4 Year: 2022 Month: November Day of Week: Correct Attention: Sustained Sustained Attention: Appears intact Memory: Appears intact Immediate Memory Recall: Sock;Blue;Bed Memory Recall Sock: Without Cue Memory Recall Blue: Without Cue Memory Recall Bed: Without Cue Awareness: Appears intact Problem Solving: Appears intact Safety/Judgment: Appears intact       Comprehension  Auditory Comprehension Overall Auditory Comprehension: Appears within functional limits for tasks assessed Yes/No Questions:  Within Functional Limits Commands: Within Functional Limits Conversation: Simple Visual Recognition/Discrimination Discrimination: Within Function Limits Reading Comprehension Reading Status: Within funtional limits (with environmental signs)    Expression Expression Primary Mode of Expression: Verbal Verbal Expression Overall Verbal Expression: Appears within functional limits for tasks assessed Level of Generative/Spontaneous Verbalization: Conversation Repetition: No impairment Naming: No impairment Pragmatics: No impairment Non-Verbal Means of Communication: Not applicable Written Expression Written Expression: Not tested   Oral / Motor  Oral Motor/Sensory Function Overall Oral Motor/Sensory Function: Within functional limits Motor Speech Overall Motor Speech: Appears within functional limits for tasks assessed Respiration: Within functional limits Phonation: Normal Resonance: Within functional limits Articulation: Within functional limitis Intelligibility: Intelligible Motor Planning: Witnin functional limits Motor Speech Errors: Not applicable                       Tressie Stalker, M.S., CCC-SLP 11/09/2021, 2:15 PM

## 2021-11-09 NOTE — Progress Notes (Addendum)
[facility] PROGRESS NOTE   Samantha [name] [id number] DOB: May 17, 1951 DOA: 11/08/2021  PCP: Charlott Rakes, MD  Brief History/Interval Summary: 70 y.o. female with medical history significant of DM2, HTN, HLD. Her symptoms started 3 days ago with dizziness.  She also mention left-sided numbness.  Evaluation in the emergency department raise concern for acute stroke.  She was hospitalized for further management.   Reason for Visit: Acute stroke  Consultants: Neurology  Procedures: Transthoracic echocardiogram is pending    Subjective/Interval History: Daughter was at bedside who was able to interpret.  Patient mentions that she continues to have dizziness but better than yesterday.  Continues to have some left-sided numbness but better than yesterday.  Denies any dysuria or frequent urination.  No chest pain or shortness of breath.     Assessment/Plan:  Acute stroke Patient presented with dizziness and left-sided numbness.  MRI showed acute left cerebellar infarct.   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  Concern was also raised for dissection in the left vertebral artery.  CT angiogram has been ordered and is pending.   Neurology is following.  Stroke work-up in progress. LDL 67.  Patient already on statin.  Check HbA1c. Patient currently on aspirin and Plavix. PT OT SLP evaluation.  Diabetes mellitus type 2, uncontrolled with hyperglycemia/diabetic neuropathy Check HbA1c.  SSI.  Prior to admission she was on glimepiride, 70/30 insulin and metformin.  Essential hypertension Allowing permissive hypertension.  Prior to admission she was on amlodipine, carvedilol and losartan.  Elevated creatinine Likely has chronic kidney disease, unknown stage.  Monitor urine output.  Monitor labs closely.  Hyperlipidemia LDL noted to be 67.  She is on statin at home which is being continued.   DVT Prophylaxis: Lovenox Code Status: Full code Family Communication:  Discussed with patient's daughter Disposition Plan: To be determined  Status is: Inpatient  Remains inpatient appropriate because: Acute stroke evaluation      Medications: Scheduled:  aspirin  300 mg Rectal Daily   Or   aspirin  325 mg Oral Daily   atorvastatin  80 mg Oral Daily   clopidogrel  75 mg Oral Daily   insulin aspart  0-15 Units Subcutaneous TID WC   insulin aspart  0-5 Units Subcutaneous QHS   Continuous: [id number] **OR** acetaminophen (TYLENOL) oral liquid 160 mg/5 mL **OR** acetaminophen, senna-docusate  Antibiotics: Anti-infectives (From admission, onward)    None       Objective:  Vital Signs  Vitals:   11/08/21 2049 11/09/21 0006 11/09/21 0430 11/09/21 0803  BP: (!) 152/72 (!) 179/90 (!) 188/66 (!) 172/79  Pulse: 75 69 63 79  Resp: 18 17 17 18   Temp:  98 F (36.7 C) 97.9 F (36.6 C) 98 F (36.7 C)  TempSrc:  Oral Oral Oral  SpO2:  97% 98% 99%  Weight:      Height:       No intake or output data in the 24 hours ending 11/09/21 0956 Filed Weights   11/08/21 1943  Weight: 73.1 kg    General appearance: Awake alert.  In no distress Resp: Clear to auscultation bilaterally.  Normal effort Cardio: S1-S2 is normal regular.  No S3-S4.  No rubs murmurs or bruit GI: Abdomen is soft.  Nontender nondistended.  Bowel sounds are present normal.  No masses organomegaly Extremities: No edema.  Full range of motion of lower extremities. Neurologic: No facial asymmetry.  Motor strength equal bilateral upper and lower extremities.   Lab Results:  Data  Reviewed: I have personally reviewed following labs and imaging studies  CBC: Recent Labs  Lab 11/08/21 0935 11/08/21 1119  WBC 9.5  --   NEUTROABS 7.2  --   HGB 13.7 11.2*  HCT 38.6 33.0*  MCV 92.1  --   PLT 377  --      Basic Metabolic Panel: Recent Labs  Lab 11/08/21 0935 11/08/21 1119  NA 133* 141  K 4.3 3.5  CL 102 107  CO2 21*  --   GLUCOSE 261* 188*  BUN 24* 22   CREATININE 1.57* 1.50*  CALCIUM 9.8  --   MG 1.9  --      GFR: Estimated Creatinine Clearance: 35.2 mL/min (A) (by C-G formula based on SCr of 1.5 mg/dL (H)).  Liver Function Tests: Recent Labs  Lab 11/08/21 0935  AST 23  ALT 24  ALKPHOS 82  BILITOT 0.8  PROT 7.5  ALBUMIN 4.2       Coagulation Profile: Recent Labs  Lab 11/08/21 0941  INR 1.0      CBG: Recent Labs  Lab 11/08/21 2113 11/09/21 0659  GLUCAP 318* 161*     Lipid Profile: Recent Labs    11/09/21 0257  CHOL 134  HDL 33*  LDLCALC 67  TRIG 168*  CHOLHDL 4.1      Recent Results (from the past 240 hour(s))  Resp Panel by RT-PCR (Flu A&B, Covid) Nasopharyngeal Swab     Status: None   Collection Time: 11/08/21  9:41 AM   Specimen: Nasopharyngeal Swab; Nasopharyngeal(NP) swabs in vial transport medium  Result Value Ref Range Status   SARS Coronavirus 2 by RT PCR NEGATIVE NEGATIVE Final    Comment: (NOTE) SARS-CoV-2 target nucleic acids are NOT DETECTED.  The SARS-CoV-2 RNA is generally detectable in upper respiratory specimens during the acute phase of infection. The lowest concentration of SARS-CoV-2 viral copies this assay can detect is 138 copies/mL. A negative result does not preclude SARS-Cov-2 infection and should not be used as the sole basis for treatment or other patient management decisions. A negative result may occur with  improper specimen collection/handling, submission of specimen other than nasopharyngeal swab, presence of viral mutation(s) within the areas targeted by this assay, and inadequate number of viral copies(<138 copies/mL). A negative result must be combined with clinical observations, patient history, and epidemiological information. The expected result is Negative.  Fact Sheet for Patients:  EntrepreneurPulse.com.au  Fact Sheet for Healthcare Providers:  IncredibleEmployment.be  This test is no t yet approved or  cleared by the [place] FDA and  has been authorized for detection and/or diagnosis of SARS-CoV-2 by FDA under an Emergency Use Authorization (EUA). This EUA will remain  in effect (meaning this test can be used) for the duration of the COVID-19 declaration under Section 564(b)(1) of the Act, 21 U.S.C.section 360bbb-3(b)(1), unless the authorization is terminated  or revoked sooner.       Influenza A by PCR NEGATIVE NEGATIVE Final   Influenza B by PCR NEGATIVE NEGATIVE Final    Comment: (NOTE) The Xpert Xpress SARS-CoV-2/FLU/RSV plus assay is intended as an aid in the diagnosis of influenza from Nasopharyngeal swab specimens and should not be used as a sole basis for treatment. Nasal washings and aspirates are unacceptable for Xpert Xpress SARS-CoV-2/FLU/RSV testing.  Fact Sheet for Patients: EntrepreneurPulse.com.au  Fact Sheet for Healthcare Providers: IncredibleEmployment.be  This test is not yet approved or cleared by the [place] FDA and has been authorized for detection and/or diagnosis of  SARS-CoV-2 by FDA under an Emergency Use Authorization (EUA). This EUA will remain in effect (meaning this test can be used) for the duration of the COVID-19 declaration under Section 564(b)(1) of the Act, 21 U.S.C. section 360bbb-3(b)(1), unless the authorization is terminated or revoked.  Performed at [facility], [place] [address] [place], [place] [address]        Radiology Studies: CT Head Wo Contrast  Result Date: 11/08/2021 CLINICAL DATA:  Neuro deficits EXAM: CT HEAD WITHOUT CONTRAST TECHNIQUE: Contiguous axial images were obtained from the base of the skull through the vertex without intravenous contrast. COMPARISON:  None. FINDINGS: Brain: Ventricles are not dilated. There is no shift of midline structures. There are no signs of recent bleeding within the cranium. There is low-density in the upper left  cerebellum. There is subtle decreased density in the posterior left parieto-occipital cortex. There is decreased density in the periventricular white matter. Cortical sulci are slightly prominent. Vascular: Unremarkable Skull: Unremarkable Sinuses/Orbits: There is mucosal thickening in the ethmoid and maxillary sinuses. Other: There is increased amount of CSF insula suggesting partial empty sella. IMPRESSION: There are no signs of bleeding within the cranium. Low-density foci is seen in the upper left cerebellum and possibly in the left posterior parieto-occipital cortex. Findings suggest acute or chronic ischemia in the left PCA circulation. Neurological evaluation and MRI should be considered. Other findings as described in the body of the report. Imaging findings were relayed to patient's provider Theodis Blaze by telephone call at 10:44 a.m. on 11/08/2021. Electronically Signed   By: Elmer Picker M.D.   On: 11/08/2021 10:48   MR ANGIO HEAD WO CONTRAST  Addendum Date: 11/08/2021   ADDENDUM REPORT: 11/08/2021 17:07 ADDENDUM: Findings discussed with Dr. Cherylann Ratel via telephone at 5:00 p.m. Electronically Signed   By: Margaretha Sheffield M.D.   On: 11/08/2021 17:07   Result Date: 11/08/2021 CLINICAL DATA:  Neuro deficit, acute, stroke suspected EXAM: MRI HEAD WITHOUT CONTRAST MRA HEAD WITHOUT CONTRAST MRA OF THE NECK WITHOUT AND WITH CONTRAST TECHNIQUE: Multiplanar, multi-echo pulse sequences of the brain and surrounding structures were acquired without intravenous contrast. Angiographic images of the Circle of Willis were acquired using MRA technique without intravenous contrast. Angiographic images of the neck were acquired using MRA technique without and with intravenous contrast. Carotid stenosis measurements (when applicable) are obtained utilizing NASCET criteria, using the distal internal carotid diameter as the denominator. CONTRAST:  25mL GADAVIST GADOBUTROL 1 MMOL/ML IV SOLN COMPARISON:  CT  head from the same day. FINDINGS: MR HEAD FINDINGS Brain: Acute left cerebellar infarct. Mild edema without significant mass effect. Additional mild to moderate scattered T2 hyperintensities in the white matter, nonspecific but compatible with chronic microvascular ischemic disease. No hydrocephalus, mass lesion, midline shift, or acute hemorrhage. Punctate focus of susceptibility artifact in the left periventricular frontal lobe, probably the sequela of prior microhemorrhage. Probable small remote lacunar infarct in left corona radiata. Vascular: See below. Skull and upper cervical spine: Normal marrow signal. Sinuses/Orbits: Mild paranasal sinus mucosal thickening. Other: No mastoid effusions MRA HEAD FINDINGS Anterior circulation: Bilateral intracranial ICAs are patent with mild paraclinoid ICA stenosis. Severe stenosis versus short-segment occlusion of the distal left M1 MCA. Bilateral proximal M2 MCA branches are patent. Bilateral ACAs are patent. Small right A1 ACA. Posterior circulation: Poor flow related signal proximally (see findings in MRA neck below). 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 Right intradural vertebral artery is patent. Multifocal mild to moderate narrowing of a small  and irregular basilar artery with fetal like PCAs. MRA NECK FINDINGS Aortic arch: Great vessel origins are patent. Right carotid system: Patent. No significant (greater than 50%) stenosis. Left carotid system: Patent. No significant (greater than 50%) stenosis. Vertebral arteries: Multifocal irregularity of the left vertebral artery with multifocal severe stenosis. Right vertebral artery is patent without significant stenosis. Bilateral PCAs are patent with moderate left P2 PCA stenosis. Other: None. Brain: Acute infarct in the left cerebellum. IMPRESSION: MRI: 1. Acute left cerebellar infarct.  Mild edema without mass effect. 2. Mild-to-moderate chronic microvascular ischemic disease MRA head: 1.  Severe stenosis versus short-segment occlusion of the distal left M1 MCA with preserved flow related signal and proximal M2 branches. 2. 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 3. Moderate left P2 PCA stenosis. 4. 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 MRA neck: 1. Multifocal irregularity of the left vertebral artery in the neck with multifocal severe stenosis. Given the relative paucity of atherosclerosis/narrowing in the neck, dissection is a differential consideration. A CTA neck could further evaluate if clinically indicated. 2. Otherwise, no significant (greater than 50%) stenosis in the neck. Electronically Signed: By: Margaretha Sheffield M.D. On: 11/08/2021 16:52   MR Angiogram Neck W or Wo Contrast  Addendum Date: 11/08/2021   ADDENDUM REPORT: 11/08/2021 17:07 ADDENDUM: Findings discussed with Dr. Cherylann Ratel via telephone at 5:00 p.m. Electronically Signed   By: Margaretha Sheffield M.D.   On: 11/08/2021 17:07   Result Date: 11/08/2021 CLINICAL DATA:  Neuro deficit, acute, stroke suspected EXAM: MRI HEAD WITHOUT CONTRAST MRA HEAD WITHOUT CONTRAST MRA OF THE NECK WITHOUT AND WITH CONTRAST TECHNIQUE: Multiplanar, multi-echo pulse sequences of the brain and surrounding structures were acquired without intravenous contrast. Angiographic images of the Circle of Willis were acquired using MRA technique without intravenous contrast. Angiographic images of the neck were acquired using MRA technique without and with intravenous contrast. Carotid stenosis measurements (when applicable) are obtained utilizing NASCET criteria, using the distal internal carotid diameter as the denominator. CONTRAST:  21mL GADAVIST GADOBUTROL 1 MMOL/ML IV SOLN COMPARISON:  CT head from the same day. FINDINGS: MR HEAD FINDINGS Brain: Acute left cerebellar infarct. Mild edema without significant mass effect. Additional mild to moderate scattered T2  hyperintensities in the white matter, nonspecific but compatible with chronic microvascular ischemic disease. No hydrocephalus, mass lesion, midline shift, or acute hemorrhage. Punctate focus of susceptibility artifact in the left periventricular frontal lobe, probably the sequela of prior microhemorrhage. Probable small remote lacunar infarct in left corona radiata. Vascular: See below. Skull and upper cervical spine: Normal marrow signal. Sinuses/Orbits: Mild paranasal sinus mucosal thickening. Other: No mastoid effusions MRA HEAD FINDINGS Anterior circulation: Bilateral intracranial ICAs are patent with mild paraclinoid ICA stenosis. Severe stenosis versus short-segment occlusion of the distal left M1 MCA. Bilateral proximal M2 MCA branches are patent. Bilateral ACAs are patent. Small right A1 ACA. Posterior circulation: Poor flow related signal proximally (see findings in MRA neck below). 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 Right intradural vertebral artery is patent. Multifocal mild to moderate narrowing of a small and irregular basilar artery with fetal like PCAs. MRA NECK FINDINGS Aortic arch: Great vessel origins are patent. Right carotid system: Patent. No significant (greater than 50%) stenosis. Left carotid system: Patent. No significant (greater than 50%) stenosis. Vertebral arteries: Multifocal irregularity of the left vertebral artery with multifocal severe stenosis. Right vertebral artery is patent without significant stenosis. Bilateral PCAs are patent  with moderate left P2 PCA stenosis. Other: None. Brain: Acute infarct in the left cerebellum. IMPRESSION: MRI: 1. Acute left cerebellar infarct.  Mild edema without mass effect. 2. Mild-to-moderate chronic microvascular ischemic disease MRA head: 1. Severe stenosis versus short-segment occlusion of the distal left M1 MCA with preserved flow related signal and proximal M2 branches. 2. Severe stenosis of the distal intradural  left vertebral artery. 3. Moderate left P2 PCA stenosis. 4. 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 MRA neck: 1. Multifocal irregularity of the left vertebral artery in the neck with multifocal severe stenosis. Given the relative paucity of atherosclerosis/narrowing in the neck, dissection is a differential consideration. A CTA neck could further evaluate if clinically indicated. 2. Otherwise, no significant (greater than 50%) stenosis in the neck. Electronically Signed: By: Margaretha Sheffield M.D. On: 11/08/2021 16:52   MR Brain Wo Contrast (neuro protocol)  Addendum Date: 11/08/2021   ADDENDUM REPORT: 11/08/2021 17:07 ADDENDUM: Findings discussed with Dr. Cherylann Ratel via telephone at 5:00 p.m. Electronically Signed   By: Margaretha Sheffield M.D.   On: 11/08/2021 17:07   Result Date: 11/08/2021 CLINICAL DATA:  Neuro deficit, acute, stroke suspected EXAM: MRI HEAD WITHOUT CONTRAST MRA HEAD WITHOUT CONTRAST MRA OF THE NECK WITHOUT AND WITH CONTRAST TECHNIQUE: Multiplanar, multi-echo pulse sequences of the brain and surrounding structures were acquired without intravenous contrast. Angiographic images of the Circle of Willis were acquired using MRA technique without intravenous contrast. Angiographic images of the neck were acquired using MRA technique without and with intravenous contrast. Carotid stenosis measurements (when applicable) are obtained utilizing NASCET criteria, using the distal internal carotid diameter as the denominator. CONTRAST:  88mL GADAVIST GADOBUTROL 1 MMOL/ML IV SOLN COMPARISON:  CT head from the same day. FINDINGS: MR HEAD FINDINGS Brain: Acute left cerebellar infarct. Mild edema without significant mass effect. Additional mild to moderate scattered T2 hyperintensities in the white matter, nonspecific but compatible with chronic microvascular ischemic disease. No hydrocephalus, mass lesion, midline shift, or acute hemorrhage. Punctate  focus of susceptibility artifact in the left periventricular frontal lobe, probably the sequela of prior microhemorrhage. Probable small remote lacunar infarct in left corona radiata. Vascular: See below. Skull and upper cervical spine: Normal marrow signal. Sinuses/Orbits: Mild paranasal sinus mucosal thickening. Other: No mastoid effusions MRA HEAD FINDINGS Anterior circulation: Bilateral intracranial ICAs are patent with mild paraclinoid ICA stenosis. Severe stenosis versus short-segment occlusion of the distal left M1 MCA. Bilateral proximal M2 MCA branches are patent. Bilateral ACAs are patent. Small right A1 ACA. Posterior circulation: Poor flow related signal proximally (see findings in MRA neck below). 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 Right intradural vertebral artery is patent. Multifocal mild to moderate narrowing of a small and irregular basilar artery with fetal like PCAs. MRA NECK FINDINGS Aortic arch: Great vessel origins are patent. Right carotid system: Patent. No significant (greater than 50%) stenosis. Left carotid system: Patent. No significant (greater than 50%) stenosis. Vertebral arteries: Multifocal irregularity of the left vertebral artery with multifocal severe stenosis. Right vertebral artery is patent without significant stenosis. Bilateral PCAs are patent with moderate left P2 PCA stenosis. Other: None. Brain: Acute infarct in the left cerebellum. IMPRESSION: MRI: 1. Acute left cerebellar infarct.  Mild edema without mass effect. 2. Mild-to-moderate chronic microvascular ischemic disease MRA head: 1. Severe stenosis versus short-segment occlusion of the distal left M1 MCA with preserved flow related signal and proximal M2 branches. 2. Severe stenosis of the distal intradural  left vertebral artery. 3. Moderate left P2 PCA stenosis. 4. 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 MRA neck: 1. Multifocal  irregularity of the left vertebral artery in the neck with multifocal severe stenosis. Given the relative paucity of atherosclerosis/narrowing in the neck, dissection is a differential consideration. A CTA neck could further evaluate if clinically indicated. 2. Otherwise, no significant (greater than 50%) stenosis in the neck. Electronically Signed: By: Feliberto Harts M.D. On: 11/08/2021 16:52       LOS: 1 day   Samantha [name]  [facility] Pager on [email or web address]  11/09/2021, 9:56 AM

## 2021-11-09 NOTE — Progress Notes (Signed)
OT Cancellation Note  Patient Details Name: Alan Riles MRN: [id number] DOB: 10-Jan-1951   Cancelled Treatment:    Reason Eval/Treat Not Completed: Patient not medically ready (concern for dissection - CTA ordered but not yet completed.). OT will continue to follow for evaluation  Emelda Fear 11/09/2021, 9:22 AM  Nyoka Cowden OTR/L Acute [facility] Services Pager: [phone] Office: [phone]

## 2021-11-09 NOTE — Progress Notes (Signed)
PT Cancellation Note  Patient Details Name: Samantha [name] MRN: [id number] DOB: January 29, 1951   Cancelled Treatment:    Reason Eval/Treat Not Completed: Patient not medically ready Noted concern for dissection - CTA ordered but not yet completed. Will hold comprehensive PT evaluation until results cleared. I will check back with [name] this afternoon.   Berton Mount 11/09/2021, 8:37 AM

## 2021-11-09 NOTE — Progress Notes (Signed)
STROKE TEAM PROGRESS NOTE   ATTENDING NOTE: I reviewed above note and agree with the assessment and plan. Pt was seen and examined.   70 year old female with history of hypertension, hyperlipidemia, PAD, diabetes admitted for episode of vertigo, left-sided numbness and discoordination.  CT showed left cerebellum hypodensity.  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  MRA head and neck showed left M1 high-grade stenosis with short segment occlusion, left VA, basilar artery and tandem stenosis, bilateral fetal PCAs with left P2 moderate to high stenosis.  CTA head and neck again showed severe stenosis of left VA with possible dissection, severe left M1 segment stenosis, moderate atheromatous narrowing of proximal basilar artery and bilateral PCAs.  EF 60 to 65%, LDL 67, UDS negative, A1c 8.9.  Creatinine 1.50.  BP persistently elevated.  On exam, daughter at bedside, used ipad interpreter.  Patient stated symptoms much improved and now resolved.  Examination neurologically intact, no focal deficit, no nystagmus or ataxia.  Etiology for patient stroke likely due to large vessel disease including left VA stenosis versus dissection, basilar artery stenosis, other intracranial stenosis including left M1 and bilateral PCA.  Patient has multiple risk factors not in good control, especially with uncontrolled diabetes and hypertension.  Recommend aspirin 325 and Plavix 75 DAPT for 3 months and then Plavix alone given multifocal intracranial stenosis.  Increase Lipitor 40 to 80 for better stroke prevention.  Aggressive risk factor modification.  BP goal 130-150 given multifocal intracranial large vessel stenosis.  PT/OT pending.  For detailed assessment and plan, please refer to above as I have made changes wherever appropriate.   Neurology will sign off. Please call with questions. Pt will follow up with stroke clinic NP at [facility] in about 4 weeks. Thanks for the  consult.   Marvel Plan, MD PhD Stroke Neurology 11/09/2021 5:45 PM    INTERVAL HISTORY Patient is seen in her room with her daughter at the bedside.  She is Spanish-speaking, and a video interpreter was used to communicate with the patient and conduct the exam.  Patient reports feeling dizziness Friday and yesterday and states that yesterday, she felt acutely dizzy at 0800 with numbness and incoordination of her left leg.  Most symptoms have now resolved with patient stating that she feels only a little residual dizziness today.  MRI reveals acute left cerebellar infarct.  Vitals:   11/09/21 0006 11/09/21 0430 11/09/21 0803 11/09/21 1142  BP: (!) 179/90 (!) 188/66 (!) 172/79 (!) 192/78  Pulse: 69 63 79 66  Resp: 17 17 18 16   Temp: 98 F (36.7 C) 97.9 F (36.6 C) 98 F (36.7 C) 98.7 F (37.1 C)  TempSrc: Oral Oral Oral Oral  SpO2: 97% 98% 99% 95%  Weight:      Height:       CBC:  Recent Labs  Lab 11/08/21 0935 11/08/21 1119  WBC 9.5  --   NEUTROABS 7.2  --   HGB 13.7 11.2*  HCT 38.6 33.0*  MCV 92.1  --   PLT 377  --    Basic Metabolic Panel:  Recent Labs  Lab 11/08/21 0935 11/08/21 1119  NA 133* 141  K 4.3 3.5  CL 102 107  CO2 21*  --   GLUCOSE 261* 188*  BUN 24* 22  CREATININE 1.57* 1.50*  CALCIUM 9.8  --   MG 1.9  --    Lipid Panel:  Recent Labs  Lab 11/09/21 0257  CHOL 134  TRIG 168*  HDL 33*  CHOLHDL 4.1  VLDL 34  LDLCALC 67   HgbA1c: No results for input(s): HGBA1C in the last 168 hours. Urine Drug Screen:  Recent Labs  Lab 11/08/21 1906  LABOPIA NONE DETECTED  COCAINSCRNUR NONE DETECTED  LABBENZ NONE DETECTED  AMPHETMU NONE DETECTED  THCU NONE DETECTED  LABBARB NONE DETECTED    Alcohol Level  Recent Labs  Lab 11/08/21 0941  ETH <10    IMAGING past 24 hours CT ANGIO HEAD NECK W WO CM  Result Date: 11/09/2021 CLINICAL DATA:  Vertebral dissection suspected EXAM: CT ANGIOGRAPHY HEAD AND NECK TECHNIQUE: Multidetector CT imaging  of the head and neck was performed using the standard protocol during bolus administration of intravenous contrast. Multiplanar CT image reconstructions and MIPs were obtained to evaluate the vascular anatomy. Carotid stenosis measurements (when applicable) are obtained utilizing NASCET criteria, using the distal internal carotid diameter as the denominator. CONTRAST:  10mL OMNIPAQUE IOHEXOL 350 MG/ML SOLN COMPARISON:  Brain MRI and MRA from yesterday FINDINGS: CT HEAD FINDINGS Brain: Acute left superior cerebellar territory are infarct. No acute hemorrhage. No hydrocephalus or masslike finding Vascular: See below Skull: Benign Sinuses: Benign Orbits: Negative Review of the MIP images confirms the above findings CTA NECK FINDINGS Aortic arch: Atheromatous plaque.  Two vessel branching. Right carotid system: Atheromatous calcification at the bifurcation. No stenosis or ulceration. Left carotid system: Atheromatous plaque at the bifurcation. No stenosis or ulceration Vertebral arteries: Proximal subclavian atherosclerosis especially on the left where there is a low-density plaque and vessel kink causing 65% narrowing. Intermittent thready flow in the left vertebral artery with faint density especially at the V3 segment, dissection is again suspected. Skeleton: Negative Other neck: Negative Upper chest: Negative Review of the MIP images confirms the above findings CTA HEAD FINDINGS Anterior circulation: Atheromatous plaque affecting the carotid siphons. Notable right paraclinoid ICA stenosis measuring ~50%. Severe stenosis the left M1 segment with symmetric density of downstream vessels. Negative for aneurysm Posterior circulation: Small vertebral and basilar arteries in the setting of fetal type circulation. Minimal faint flow in the left vertebral artery with high-grade distal narrowing before the basilar. Diffusely small basilar from fetal type circulation with tandem at least moderate stenoses best seen on thick  MIPS. Moderate left P2 and bilateral P3 segment stenosis. Venous sinuses: Unremarkable in the arterial phase Anatomic variants: As above Review of the MIP images confirms the above findings IMPRESSION: 1. Stable from MRI/MRA yesterday. 2. Intermittent severe stenosis of the left vertebral artery with minimal intradural contribution, dissection is again a strong consideration given the degree of irregularity. 65% stenosis of the proximal left subclavian artery from atherosclerosis and vessel kink. 3. Severe left M1 segment stenosis. 4. At least moderate atheromatous narrowings of the proximal basilar. Moderate bilateral PCA narrowings. Electronically Signed   By: Tiburcio Pea M.D.   On: 11/09/2021 10:10   MR ANGIO HEAD WO CONTRAST  Addendum Date: 11/08/2021   ADDENDUM REPORT: 11/08/2021 17:07 ADDENDUM: Findings discussed with Dr. Margie Ege via telephone at 5:00 p.m. Electronically Signed   By: Feliberto Harts M.D.   On: 11/08/2021 17:07   Result Date: 11/08/2021 CLINICAL DATA:  Neuro deficit, acute, stroke suspected EXAM: MRI HEAD WITHOUT CONTRAST MRA HEAD WITHOUT CONTRAST MRA OF THE NECK WITHOUT AND WITH CONTRAST TECHNIQUE: Multiplanar, multi-echo pulse sequences of the brain and surrounding structures were acquired without intravenous contrast. Angiographic images of the Circle of Willis were acquired using MRA technique without intravenous contrast. Angiographic images of the neck were  acquired using MRA technique without and with intravenous contrast. Carotid stenosis measurements (when applicable) are obtained utilizing NASCET criteria, using the distal internal carotid diameter as the denominator. CONTRAST:  45mL GADAVIST GADOBUTROL 1 MMOL/ML IV SOLN COMPARISON:  CT head from the same day. FINDINGS: MR HEAD FINDINGS Brain: Acute left cerebellar infarct. Mild edema without significant mass effect. Additional mild to moderate scattered T2 hyperintensities in the white matter, nonspecific but  compatible with chronic microvascular ischemic disease. No hydrocephalus, mass lesion, midline shift, or acute hemorrhage. Punctate focus of susceptibility artifact in the left periventricular frontal lobe, probably the sequela of prior microhemorrhage. Probable small remote lacunar infarct in left corona radiata. Vascular: See below. Skull and upper cervical spine: Normal marrow signal. Sinuses/Orbits: Mild paranasal sinus mucosal thickening. Other: No mastoid effusions MRA HEAD FINDINGS Anterior circulation: Bilateral intracranial ICAs are patent with mild paraclinoid ICA stenosis. Severe stenosis versus short-segment occlusion of the distal left M1 MCA. Bilateral proximal M2 MCA branches are patent. Bilateral ACAs are patent. Small right A1 ACA. Posterior circulation: Poor flow related signal proximally (see findings in MRA neck below). 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 Right intradural vertebral artery is patent. Multifocal mild to moderate narrowing of a small and irregular basilar artery with fetal like PCAs. MRA NECK FINDINGS Aortic arch: Great vessel origins are patent. Right carotid system: Patent. No significant (greater than 50%) stenosis. Left carotid system: Patent. No significant (greater than 50%) stenosis. Vertebral arteries: Multifocal irregularity of the left vertebral artery with multifocal severe stenosis. Right vertebral artery is patent without significant stenosis. Bilateral PCAs are patent with moderate left P2 PCA stenosis. Other: None. Brain: Acute infarct in the left cerebellum. IMPRESSION: MRI: 1. Acute left cerebellar infarct.  Mild edema without mass effect. 2. Mild-to-moderate chronic microvascular ischemic disease MRA head: 1. Severe stenosis versus short-segment occlusion of the distal left M1 MCA with preserved flow related signal and proximal M2 branches. 2. 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 3. Moderate left P2 PCA  stenosis. 4. 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 MRA neck: 1. Multifocal irregularity of the left vertebral artery in the neck with multifocal severe stenosis. Given the relative paucity of atherosclerosis/narrowing in the neck, dissection is a differential consideration. A CTA neck could further evaluate if clinically indicated. 2. Otherwise, no significant (greater than 50%) stenosis in the neck. Electronically Signed: By: Margaretha Sheffield M.D. On: 11/08/2021 16:52   MR Angiogram Neck W or Wo Contrast  Addendum Date: 11/08/2021   ADDENDUM REPORT: 11/08/2021 17:07 ADDENDUM: Findings discussed with Dr. Cherylann Ratel via telephone at 5:00 p.m. Electronically Signed   By: Margaretha Sheffield M.D.   On: 11/08/2021 17:07   Result Date: 11/08/2021 CLINICAL DATA:  Neuro deficit, acute, stroke suspected EXAM: MRI HEAD WITHOUT CONTRAST MRA HEAD WITHOUT CONTRAST MRA OF THE NECK WITHOUT AND WITH CONTRAST TECHNIQUE: Multiplanar, multi-echo pulse sequences of the brain and surrounding structures were acquired without intravenous contrast. Angiographic images of the Circle of Willis were acquired using MRA technique without intravenous contrast. Angiographic images of the neck were acquired using MRA technique without and with intravenous contrast. Carotid stenosis measurements (when applicable) are obtained utilizing NASCET criteria, using the distal internal carotid diameter as the denominator. CONTRAST:  20mL GADAVIST GADOBUTROL 1 MMOL/ML IV SOLN COMPARISON:  CT head from the same day. FINDINGS: MR HEAD FINDINGS Brain: Acute left cerebellar infarct. Mild edema without significant mass effect. Additional mild to  moderate scattered T2 hyperintensities in the white matter, nonspecific but compatible with chronic microvascular ischemic disease. No hydrocephalus, mass lesion, midline shift, or acute hemorrhage. Punctate focus of susceptibility artifact in the left  periventricular frontal lobe, probably the sequela of prior microhemorrhage. Probable small remote lacunar infarct in left corona radiata. Vascular: See below. Skull and upper cervical spine: Normal marrow signal. Sinuses/Orbits: Mild paranasal sinus mucosal thickening. Other: No mastoid effusions MRA HEAD FINDINGS Anterior circulation: Bilateral intracranial ICAs are patent with mild paraclinoid ICA stenosis. Severe stenosis versus short-segment occlusion of the distal left M1 MCA. Bilateral proximal M2 MCA branches are patent. Bilateral ACAs are patent. Small right A1 ACA. Posterior circulation: Poor flow related signal proximally (see findings in MRA neck below). 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 Right intradural vertebral artery is patent. Multifocal mild to moderate narrowing of a small and irregular basilar artery with fetal like PCAs. MRA NECK FINDINGS Aortic arch: Great vessel origins are patent. Right carotid system: Patent. No significant (greater than 50%) stenosis. Left carotid system: Patent. No significant (greater than 50%) stenosis. Vertebral arteries: Multifocal irregularity of the left vertebral artery with multifocal severe stenosis. Right vertebral artery is patent without significant stenosis. Bilateral PCAs are patent with moderate left P2 PCA stenosis. Other: None. Brain: Acute infarct in the left cerebellum. IMPRESSION: MRI: 1. Acute left cerebellar infarct.  Mild edema without mass effect. 2. Mild-to-moderate chronic microvascular ischemic disease MRA head: 1. Severe stenosis versus short-segment occlusion of the distal left M1 MCA with preserved flow related signal and proximal M2 branches. 2. 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 3. Moderate left P2 PCA stenosis. 4. 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 MRA neck: 1. Multifocal irregularity of the left vertebral artery in the  neck with multifocal severe stenosis. Given the relative paucity of atherosclerosis/narrowing in the neck, dissection is a differential consideration. A CTA neck could further evaluate if clinically indicated. 2. Otherwise, no significant (greater than 50%) stenosis in the neck. Electronically Signed: By: Margaretha Sheffield M.D. On: 11/08/2021 16:52   MR Brain Wo Contrast (neuro protocol)  Addendum Date: 11/08/2021   ADDENDUM REPORT: 11/08/2021 17:07 ADDENDUM: Findings discussed with Dr. Cherylann Ratel via telephone at 5:00 p.m. Electronically Signed   By: Margaretha Sheffield M.D.   On: 11/08/2021 17:07   Result Date: 11/08/2021 CLINICAL DATA:  Neuro deficit, acute, stroke suspected EXAM: MRI HEAD WITHOUT CONTRAST MRA HEAD WITHOUT CONTRAST MRA OF THE NECK WITHOUT AND WITH CONTRAST TECHNIQUE: Multiplanar, multi-echo pulse sequences of the brain and surrounding structures were acquired without intravenous contrast. Angiographic images of the Circle of Willis were acquired using MRA technique without intravenous contrast. Angiographic images of the neck were acquired using MRA technique without and with intravenous contrast. Carotid stenosis measurements (when applicable) are obtained utilizing NASCET criteria, using the distal internal carotid diameter as the denominator. CONTRAST:  60mL GADAVIST GADOBUTROL 1 MMOL/ML IV SOLN COMPARISON:  CT head from the same day. FINDINGS: MR HEAD FINDINGS Brain: Acute left cerebellar infarct. Mild edema without significant mass effect. Additional mild to moderate scattered T2 hyperintensities in the white matter, nonspecific but compatible with chronic microvascular ischemic disease. No hydrocephalus, mass lesion, midline shift, or acute hemorrhage. Punctate focus of susceptibility artifact in the left periventricular frontal lobe, probably the sequela of prior microhemorrhage. Probable small remote lacunar infarct in left corona radiata. Vascular: See below. Skull and upper  cervical spine: Normal marrow signal. Sinuses/Orbits:  Mild paranasal sinus mucosal thickening. Other: No mastoid effusions MRA HEAD FINDINGS Anterior circulation: Bilateral intracranial ICAs are patent with mild paraclinoid ICA stenosis. Severe stenosis versus short-segment occlusion of the distal left M1 MCA. Bilateral proximal M2 MCA branches are patent. Bilateral ACAs are patent. Small right A1 ACA. Posterior circulation: Poor flow related signal proximally (see findings in MRA neck below). 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 Right intradural vertebral artery is patent. Multifocal mild to moderate narrowing of a small and irregular basilar artery with fetal like PCAs. MRA NECK FINDINGS Aortic arch: Great vessel origins are patent. Right carotid system: Patent. No significant (greater than 50%) stenosis. Left carotid system: Patent. No significant (greater than 50%) stenosis. Vertebral arteries: Multifocal irregularity of the left vertebral artery with multifocal severe stenosis. Right vertebral artery is patent without significant stenosis. Bilateral PCAs are patent with moderate left P2 PCA stenosis. Other: None. Brain: Acute infarct in the left cerebellum. IMPRESSION: MRI: 1. Acute left cerebellar infarct.  Mild edema without mass effect. 2. Mild-to-moderate chronic microvascular ischemic disease MRA head: 1. Severe stenosis versus short-segment occlusion of the distal left M1 MCA with preserved flow related signal and proximal M2 branches. 2. 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 3. Moderate left P2 PCA stenosis. 4. 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 MRA neck: 1. Multifocal irregularity of the left vertebral artery in the neck with multifocal severe stenosis. Given the relative paucity of atherosclerosis/narrowing in the neck, dissection is a differential consideration. A CTA neck could further  evaluate if clinically indicated. 2. Otherwise, no significant (greater than 50%) stenosis in the neck. Electronically Signed: By: Margaretha Sheffield M.D. On: 11/08/2021 16:52   ECHOCARDIOGRAM COMPLETE  Result Date: 11/09/2021    ECHOCARDIOGRAM REPORT   Patient Name:   MARNAE CHILTON Date of Exam: 11/09/2021 Medical Rec #:  [id number]          Height:       64.0 in Accession #:    [id number]         Weight:       161.2 lb Date of Birth:  May 25, 1951          BSA:          1.785 m Patient Age:    61 years           BP:           192/78 mmHg Patient Gender: F                  HR:           64 bpm. Exam Location:  Inpatient Procedure: 2D Echo, Cardiac Doppler, Color Doppler and Intracardiac            Opacification Agent Indications:    stroke work up  History:        Patient has prior history of Echocardiogram examinations.  Sonographer:    Ula Lingo Referring Phys: [id number] [place]  1. Left ventricular ejection fraction, by estimation, is 60 to 65%. The left ventricle has normal function. The left ventricle has no regional wall motion abnormalities. There is moderate left ventricular hypertrophy. Left ventricular diastolic parameters are consistent with Grade I diastolic dysfunction (impaired relaxation).  2. Right ventricular systolic function is normal. The right ventricular size is normal. Tricuspid regurgitation signal is inadequate for assessing PA pressure.  3. The mitral valve is normal in structure. No  evidence of mitral valve regurgitation.  4. The aortic valve is normal in structure. Aortic valve regurgitation is not visualized. No aortic stenosis is present.  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  6. The inferior vena cava is normal in size with greater than 50% respiratory variability, suggesting right atrial pressure of 3 mmHg. Conclusion(s)/Recommendation(s): Normal biventricular function without evidence of hemodynamically significant valvular heart  disease. FINDINGS  Left Ventricle: Left ventricular ejection fraction, by estimation, is 60 to 65%. The left ventricle has normal function. The left ventricle has no regional wall motion abnormalities. The left ventricular internal cavity size was normal in size. There is  moderate left ventricular hypertrophy. Left ventricular diastolic parameters are consistent with Grade I diastolic dysfunction (impaired relaxation). Right Ventricle: The right ventricular size is normal. No increase in right ventricular wall thickness. Right ventricular systolic function is normal. Tricuspid regurgitation signal is inadequate for assessing PA pressure. Left Atrium: Left atrial size was normal in size. Right Atrium: Right atrial size was normal in size. Pericardium: There is no evidence of pericardial effusion. Mitral Valve: The mitral valve is normal in structure. No evidence of mitral valve regurgitation. Tricuspid Valve: The tricuspid valve is not well visualized. Tricuspid valve regurgitation is not demonstrated. Aortic Valve: The aortic valve is normal in structure. Aortic valve regurgitation is not visualized. No aortic stenosis is present. Pulmonic Valve: The pulmonic valve was not well visualized. Pulmonic valve regurgitation is not visualized. 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 Venous: The inferior vena cava is normal in size with greater than 50% respiratory variability, suggesting right atrial pressure of 3 mmHg. IAS/Shunts: No atrial level shunt detected by color flow Doppler.  LEFT VENTRICLE PLAX 2D LVIDd:         4.40 cm   Diastology LVIDs:         3.20 cm   LV e' medial:    3.81 cm/s LV PW:         1.30 cm   LV E/e' medial:  18.0 LV IVS:        1.50 cm   LV e' lateral:   7.29 cm/s LVOT diam:     1.90 cm   LV E/e' lateral: 9.4 LV SV:         42 LV SV Index:   24 LVOT Area:     2.84 cm  RIGHT VENTRICLE TAPSE (M-mode): 1.7 cm LEFT ATRIUM             Index LA diam:        3.60 cm 2.02 cm/m LA  Vol (A2C):   46.7 ml 26.17 ml/m LA Vol (A4C):   35.8 ml 20.06 ml/m LA Biplane Vol: 41.2 ml 23.08 ml/m  AORTIC VALVE LVOT Vmax:   73.80 cm/s LVOT Vmean:  51.600 cm/s LVOT VTI:    0.149 m  AORTA Ao Root diam: 3.10 cm MITRAL VALVE MV Area (PHT): 2.39 cm     SHUNTS MV Decel Time: 318 msec     Systemic VTI:  0.15 m MV E velocity: 68.60 cm/s   Systemic Diam: 1.90 cm MV A velocity: 116.00 cm/s MV E/A ratio:  0.59 Landscape architect signed by Phineas Inches Signature Date/Time: 11/09/2021/2:32:29 PM    Final     PHYSICAL EXAM General:  Patient is an alert, well-developed, well-nourished female in no acute distress   NEURO:  Mental Status: AA&Ox3  Speech/Language: speech is without dysarthria or aphasia.  Fluency, and comprehension intact.  Cranial Nerves:  II: PERRL. Visual fields full.  III, IV, VI: EOMI. Eyelids elevate symmetrically.  V: Sensation is intact to light touch and symmetrical to face.  VII: Smile is symmetrical. Able to puff cheeks and raise eyebrows.  VIII: hearing intact to voice. IX, X: Phonation is normal.  XII: tongue is midline without fasciculations. Motor: 5/5 strength to all muscle groups tested.  Tone: is normal and bulk is normal Sensation- Intact to light touch bilaterally.   Coordination: FTN intact bilaterally, HKS: no ataxia in BLE.No drift.  Gait- deferred   ASSESSMENT/PLAN [name] is a 70 y.o. female with history of HTN, HLD, DM and peripheral arterial disease presenting with dizziness, left-sided numbness and incoordination of left lower extremity. Patient reports that on 11/11, she felt acutely dizzy, and that on 11/12 at about 0800, she continued to have feelings of dizziness along with left sided numbness and incoordination of her left leg.  MRI reveals an acute left cerebellar infarct, with MRA showing stenosis of left M1 MCA, left vertebral artery and basilar artery.  Patient will undergo CTA of her head and neck to better characterize  these  vessels.  Stroke:  left SCA territory infarct secondary to large vessel disease with multifocal intracranial stenosis including left VA and basilar artery CT head Low-density foci in upper left cerebellum and left posterior parieto-occipital cortex. CTA head & neck Severe stenosis of left VA with possible dissection, severe left M1 segment stenosis, moderate atheromatous narrowing of proximal basilar artery and bilateral PCA MRI  acute left cerebellar infarct MRA  stenosis of distal left M1 MCA, severe stenosis of distal intradural left vertebral artery, moderate left P2 stenosis and stenosis of basilar artery, irregularity of left vertebral artery in the neck 2D Echo EF [id number], grade 1 diastolic dysfunction, no atrial level shunt. LDL 67 HgbA1c 8.9 VTE prophylaxis - SCDs aspirin 81 mg daily prior to admission, now on aspirin 325 mg daily and clopidogrel 75 mg daily DAPT for 3 months and then Plavix alone given multifocal intracranial large vessel stenosis. Therapy recommendations:  pending Disposition:  pending  Hypertension Home meds:  Coreg 25 mg BID, losartan 100 mg daily, amlodipine 10 mg daily Stable Gradually lowered BP to goal in 3 to 5 days Long-term BP goal 130-150 given multifocal intracranial large vessel stenosis  Hyperlipidemia Home meds:  Atorvastating 40 mg daily LDL 67, goal < 70 On lipitor 80 now High intensity statin initiated Continue statin at discharge  Diabetes type II Uncontrolled Home meds:  insulin 70/20 40 units q AM and 25 units q HS, metformin 1000 mg BID  Patient states that her blood glucose runs high at home, around 170-200 HgbA1c 8.9, goal < 7.0 CBGs Diabetes educator consult SSI Close PCP follow-up as outpatient for better DM control  Other Stroke Risk Factors Advanced age >59 Congestive heart failure  Other Active Problems   Hospital day # [place], NP   To contact Stroke Continuity provider, please refer to  [email or web address] After hours, contact General Neurology

## 2021-11-09 NOTE — H&P (Deleted)
[facility] PROGRESS NOTE   Samantha [name] JME:[id number] DOB: [birth year] DOA: 11/08/2021  PCP: Hoy Register, MD  Brief History/Interval Summary: 70 y.o. female with medical history significant of DM2, HTN, HLD. Her symptoms started 3 days ago with dizziness.  She also mention left-sided numbness.  Evaluation in the emergency department raise concern for acute stroke.  She was hospitalized for further management.   Reason for Visit: Acute stroke  Consultants: Neurology  Procedures: Transthoracic echocardiogram is pending    Subjective/Interval History: Daughter was at bedside who was able to interpret.  Patient mentions that she continues to have dizziness but better than yesterday.  Continues to have some left-sided numbness but better than yesterday.  Denies any dysuria or frequent urination.  No chest pain or shortness of breath.     Assessment/Plan:  Acute stroke Patient presented with dizziness and left-sided numbness.  MRI showed acute left cerebellar infarct.   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  Concern was also raised for dissection in the left vertebral artery.  CT angiogram has been ordered and is pending.   Neurology is following.  Stroke work-up in progress. LDL 67.  Patient already on statin.  Check HbA1c. Patient currently on aspirin and Plavix. PT OT SLP evaluation.  Diabetes mellitus type 2, uncontrolled with hyperglycemia/diabetic neuropathy Check HbA1c.  SSI.  Prior to admission she was on glimepiride, 70/30 insulin and metformin. Resume gabapentin.  Essential hypertension Allowing permissive hypertension.  Prior to admission she was on amlodipine, carvedilol and losartan.  Elevated creatinine Likely has chronic kidney disease, unknown stage.  Monitor urine output.  Monitor labs closely.  Hyperlipidemia LDL noted to be 67.  She is on statin at home which is being continued.   DVT Prophylaxis: Lovenox Code Status: Full  code Family Communication: Discussed with patient's daughter Disposition Plan: To be determined  Status is: Inpatient  Remains inpatient appropriate because: Acute stroke evaluation      Medications: Scheduled:  aspirin  300 mg Rectal Daily   Or   aspirin  325 mg Oral Daily   atorvastatin  80 mg Oral Daily   clopidogrel  75 mg Oral Daily   insulin aspart  0-15 Units Subcutaneous TID WC   insulin aspart  0-5 Units Subcutaneous QHS   Continuous: IWL:[id number] **OR** acetaminophen (TYLENOL) oral liquid 160 mg/5 mL **OR** acetaminophen, senna-docusate  Antibiotics: Anti-infectives (From admission, onward)    None       Objective:  Vital Signs  Vitals:   11/08/21 2049 11/09/21 0006 11/09/21 0430 11/09/21 0803  BP: (!) 152/72 (!) 179/90 (!) 188/66 (!) 172/79  Pulse: 75 69 63 79  Resp: 18 17 17 18   Temp:  98 F (36.7 C) 97.9 F (36.6 C) 98 F (36.7 C)  TempSrc:  Oral Oral Oral  SpO2:  97% 98% 99%  Weight:      Height:       No intake or output data in the 24 hours ending 11/09/21 0947 Filed Weights   11/08/21 1943  Weight: 73.1 kg    General appearance: Awake alert.  In no distress Resp: Clear to auscultation bilaterally.  Normal effort Cardio: S1-S2 is normal regular.  No S3-S4.  No rubs murmurs or bruit GI: Abdomen is soft.  Nontender nondistended.  Bowel sounds are present normal.  No masses organomegaly Extremities: No edema.  Full range of motion of lower extremities. Neurologic: No facial asymmetry.  Motor strength equal bilateral upper and lower extremities.   Lab Results:  Data Reviewed: I have personally reviewed following labs and imaging studies  CBC: Recent Labs  Lab 11/08/21 0935 11/08/21 1119  WBC 9.5  --   NEUTROABS 7.2  --   HGB 13.7 11.2*  HCT 38.6 33.0*  MCV 92.1  --   PLT 377  --     Basic Metabolic Panel: Recent Labs  Lab 11/08/21 0935 11/08/21 1119  NA 133* 141  K 4.3 3.5  CL 102 107  CO2 21*  --   GLUCOSE  261* 188*  BUN 24* 22  CREATININE 1.57* 1.50*  CALCIUM 9.8  --   MG 1.9  --     GFR: Estimated Creatinine Clearance: 35.2 mL/min (A) (by C-G formula based on SCr of 1.5 mg/dL (H)).  Liver Function Tests: Recent Labs  Lab 11/08/21 0935  AST 23  ALT 24  ALKPHOS 82  BILITOT 0.8  PROT 7.5  ALBUMIN 4.2      Coagulation Profile: Recent Labs  Lab 11/08/21 0941  INR 1.0     CBG: Recent Labs  Lab 11/08/21 2113 11/09/21 0659  GLUCAP 318* 161*    Lipid Profile: Recent Labs    11/09/21 0257  CHOL 134  HDL 33*  LDLCALC 67  TRIG 168*  CHOLHDL 4.1     Recent Results (from the past 240 hour(s))  Resp Panel by RT-PCR (Flu A&B, Covid) Nasopharyngeal Swab     Status: None   Collection Time: 11/08/21  9:41 AM   Specimen: Nasopharyngeal Swab; Nasopharyngeal(NP) swabs in vial transport medium  Result Value Ref Range Status   SARS Coronavirus 2 by RT PCR NEGATIVE NEGATIVE Final    Comment: (NOTE) SARS-CoV-2 target nucleic acids are NOT DETECTED.  The SARS-CoV-2 RNA is generally detectable in upper respiratory specimens during the acute phase of infection. The lowest concentration of SARS-CoV-2 viral copies this assay can detect is 138 copies/mL. A negative result does not preclude SARS-Cov-2 infection and should not be used as the sole basis for treatment or other patient management decisions. A negative result may occur with  improper specimen collection/handling, submission of specimen other than nasopharyngeal swab, presence of viral mutation(s) within the areas targeted by this assay, and inadequate number of viral copies(<138 copies/mL). A negative result must be combined with clinical observations, patient history, and epidemiological information. The expected result is Negative.  Fact Sheet for Patients:  EntrepreneurPulse.com.au  Fact Sheet for Healthcare Providers:  IncredibleEmployment.be  This test is no t yet  approved or cleared by the [place] FDA and  has been authorized for detection and/or diagnosis of SARS-CoV-2 by FDA under an Emergency Use Authorization (EUA). This EUA will remain  in effect (meaning this test can be used) for the duration of the COVID-19 declaration under Section 564(b)(1) of the Act, 21 U.S.C.section 360bbb-3(b)(1), unless the authorization is terminated  or revoked sooner.       Influenza A by PCR NEGATIVE NEGATIVE Final   Influenza B by PCR NEGATIVE NEGATIVE Final    Comment: (NOTE) The Xpert Xpress SARS-CoV-2/FLU/RSV plus assay is intended as an aid in the diagnosis of influenza from Nasopharyngeal swab specimens and should not be used as a sole basis for treatment. Nasal washings and aspirates are unacceptable for Xpert Xpress SARS-CoV-2/FLU/RSV testing.  Fact Sheet for Patients: EntrepreneurPulse.com.au  Fact Sheet for Healthcare Providers: IncredibleEmployment.be  This test is not yet approved or cleared by the [place] FDA and has been authorized for detection and/or diagnosis of SARS-CoV-2 by FDA under an  Emergency Use Authorization (EUA). This EUA will remain in effect (meaning this test can be used) for the duration of the COVID-19 declaration under Section 564(b)(1) of the Act, 21 U.S.C. section 360bbb-3(b)(1), unless the authorization is terminated or revoked.  Performed at [facility], [place] [address] [place], [place] [address]       Radiology Studies: CT Head Wo Contrast  Result Date: 11/08/2021 CLINICAL DATA:  Neuro deficits EXAM: CT HEAD WITHOUT CONTRAST TECHNIQUE: Contiguous axial images were obtained from the base of the skull through the vertex without intravenous contrast. COMPARISON:  None. FINDINGS: Brain: Ventricles are not dilated. There is no shift of midline structures. There are no signs of recent bleeding within the cranium. There is low-density in the upper  left cerebellum. There is subtle decreased density in the posterior left parieto-occipital cortex. There is decreased density in the periventricular white matter. Cortical sulci are slightly prominent. Vascular: Unremarkable Skull: Unremarkable Sinuses/Orbits: There is mucosal thickening in the ethmoid and maxillary sinuses. Other: There is increased amount of CSF insula suggesting partial empty sella. IMPRESSION: There are no signs of bleeding within the cranium. Low-density foci is seen in the upper left cerebellum and possibly in the left posterior parieto-occipital cortex. Findings suggest acute or chronic ischemia in the left PCA circulation. Neurological evaluation and MRI should be considered. Other findings as described in the body of the report. Imaging findings were relayed to patient's provider Theodis Blaze by telephone call at 10:44 a.m. on 11/08/2021. Electronically Signed   By: Elmer Picker M.D.   On: 11/08/2021 10:48   MR ANGIO HEAD WO CONTRAST  Addendum Date: 11/08/2021   ADDENDUM REPORT: 11/08/2021 17:07 ADDENDUM: Findings discussed with Dr. Cherylann Ratel via telephone at 5:00 p.m. Electronically Signed   By: Margaretha Sheffield M.D.   On: 11/08/2021 17:07   Result Date: 11/08/2021 CLINICAL DATA:  Neuro deficit, acute, stroke suspected EXAM: MRI HEAD WITHOUT CONTRAST MRA HEAD WITHOUT CONTRAST MRA OF THE NECK WITHOUT AND WITH CONTRAST TECHNIQUE: Multiplanar, multi-echo pulse sequences of the brain and surrounding structures were acquired without intravenous contrast. Angiographic images of the Circle of Willis were acquired using MRA technique without intravenous contrast. Angiographic images of the neck were acquired using MRA technique without and with intravenous contrast. Carotid stenosis measurements (when applicable) are obtained utilizing NASCET criteria, using the distal internal carotid diameter as the denominator. CONTRAST:  31mL GADAVIST GADOBUTROL 1 MMOL/ML IV SOLN COMPARISON:   CT head from the same day. FINDINGS: MR HEAD FINDINGS Brain: Acute left cerebellar infarct. Mild edema without significant mass effect. Additional mild to moderate scattered T2 hyperintensities in the white matter, nonspecific but compatible with chronic microvascular ischemic disease. No hydrocephalus, mass lesion, midline shift, or acute hemorrhage. Punctate focus of susceptibility artifact in the left periventricular frontal lobe, probably the sequela of prior microhemorrhage. Probable small remote lacunar infarct in left corona radiata. Vascular: See below. Skull and upper cervical spine: Normal marrow signal. Sinuses/Orbits: Mild paranasal sinus mucosal thickening. Other: No mastoid effusions MRA HEAD FINDINGS Anterior circulation: Bilateral intracranial ICAs are patent with mild paraclinoid ICA stenosis. Severe stenosis versus short-segment occlusion of the distal left M1 MCA. Bilateral proximal M2 MCA branches are patent. Bilateral ACAs are patent. Small right A1 ACA. Posterior circulation: Poor flow related signal proximally (see findings in MRA neck below). 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 Right intradural vertebral artery is patent. Multifocal mild to moderate narrowing of a small and irregular basilar artery with fetal  like PCAs. MRA NECK FINDINGS Aortic arch: Great vessel origins are patent. Right carotid system: Patent. No significant (greater than 50%) stenosis. Left carotid system: Patent. No significant (greater than 50%) stenosis. Vertebral arteries: Multifocal irregularity of the left vertebral artery with multifocal severe stenosis. Right vertebral artery is patent without significant stenosis. Bilateral PCAs are patent with moderate left P2 PCA stenosis. Other: None. Brain: Acute infarct in the left cerebellum. IMPRESSION: MRI: 1. Acute left cerebellar infarct.  Mild edema without mass effect. 2. Mild-to-moderate chronic microvascular ischemic disease MRA head: 1.  Severe stenosis versus short-segment occlusion of the distal left M1 MCA with preserved flow related signal and proximal M2 branches. 2. 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 3. Moderate left P2 PCA stenosis. 4. 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 MRA neck: 1. Multifocal irregularity of the left vertebral artery in the neck with multifocal severe stenosis. Given the relative paucity of atherosclerosis/narrowing in the neck, dissection is a differential consideration. A CTA neck could further evaluate if clinically indicated. 2. Otherwise, no significant (greater than 50%) stenosis in the neck. Electronically Signed: By: Margaretha Sheffield M.D. On: 11/08/2021 16:52   MR Angiogram Neck W or Wo Contrast  Addendum Date: 11/08/2021   ADDENDUM REPORT: 11/08/2021 17:07 ADDENDUM: Findings discussed with Dr. Cherylann Ratel via telephone at 5:00 p.m. Electronically Signed   By: Margaretha Sheffield M.D.   On: 11/08/2021 17:07   Result Date: 11/08/2021 CLINICAL DATA:  Neuro deficit, acute, stroke suspected EXAM: MRI HEAD WITHOUT CONTRAST MRA HEAD WITHOUT CONTRAST MRA OF THE NECK WITHOUT AND WITH CONTRAST TECHNIQUE: Multiplanar, multi-echo pulse sequences of the brain and surrounding structures were acquired without intravenous contrast. Angiographic images of the Circle of Willis were acquired using MRA technique without intravenous contrast. Angiographic images of the neck were acquired using MRA technique without and with intravenous contrast. Carotid stenosis measurements (when applicable) are obtained utilizing NASCET criteria, using the distal internal carotid diameter as the denominator. CONTRAST:  19mL GADAVIST GADOBUTROL 1 MMOL/ML IV SOLN COMPARISON:  CT head from the same day. FINDINGS: MR HEAD FINDINGS Brain: Acute left cerebellar infarct. Mild edema without significant mass effect. Additional mild to moderate scattered T2  hyperintensities in the white matter, nonspecific but compatible with chronic microvascular ischemic disease. No hydrocephalus, mass lesion, midline shift, or acute hemorrhage. Punctate focus of susceptibility artifact in the left periventricular frontal lobe, probably the sequela of prior microhemorrhage. Probable small remote lacunar infarct in left corona radiata. Vascular: See below. Skull and upper cervical spine: Normal marrow signal. Sinuses/Orbits: Mild paranasal sinus mucosal thickening. Other: No mastoid effusions MRA HEAD FINDINGS Anterior circulation: Bilateral intracranial ICAs are patent with mild paraclinoid ICA stenosis. Severe stenosis versus short-segment occlusion of the distal left M1 MCA. Bilateral proximal M2 MCA branches are patent. Bilateral ACAs are patent. Small right A1 ACA. Posterior circulation: Poor flow related signal proximally (see findings in MRA neck below). 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 Right intradural vertebral artery is patent. Multifocal mild to moderate narrowing of a small and irregular basilar artery with fetal like PCAs. MRA NECK FINDINGS Aortic arch: Great vessel origins are patent. Right carotid system: Patent. No significant (greater than 50%) stenosis. Left carotid system: Patent. No significant (greater than 50%) stenosis. Vertebral arteries: Multifocal irregularity of the left vertebral artery with multifocal severe stenosis. Right vertebral artery is patent without significant stenosis. Bilateral PCAs are patent with moderate left P2 PCA stenosis.  Other: None. Brain: Acute infarct in the left cerebellum. IMPRESSION: MRI: 1. Acute left cerebellar infarct.  Mild edema without mass effect. 2. Mild-to-moderate chronic microvascular ischemic disease MRA head: 1. Severe stenosis versus short-segment occlusion of the distal left M1 MCA with preserved flow related signal and proximal M2 branches. 2. Severe stenosis of the distal intradural  left vertebral artery. 3. Moderate left P2 PCA stenosis. 4. 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 MRA neck: 1. Multifocal irregularity of the left vertebral artery in the neck with multifocal severe stenosis. Given the relative paucity of atherosclerosis/narrowing in the neck, dissection is a differential consideration. A CTA neck could further evaluate if clinically indicated. 2. Otherwise, no significant (greater than 50%) stenosis in the neck. Electronically Signed: By: Margaretha Sheffield M.D. On: 11/08/2021 16:52   MR Brain Wo Contrast (neuro protocol)  Addendum Date: 11/08/2021   ADDENDUM REPORT: 11/08/2021 17:07 ADDENDUM: Findings discussed with Dr. Cherylann Ratel via telephone at 5:00 p.m. Electronically Signed   By: Margaretha Sheffield M.D.   On: 11/08/2021 17:07   Result Date: 11/08/2021 CLINICAL DATA:  Neuro deficit, acute, stroke suspected EXAM: MRI HEAD WITHOUT CONTRAST MRA HEAD WITHOUT CONTRAST MRA OF THE NECK WITHOUT AND WITH CONTRAST TECHNIQUE: Multiplanar, multi-echo pulse sequences of the brain and surrounding structures were acquired without intravenous contrast. Angiographic images of the Circle of Willis were acquired using MRA technique without intravenous contrast. Angiographic images of the neck were acquired using MRA technique without and with intravenous contrast. Carotid stenosis measurements (when applicable) are obtained utilizing NASCET criteria, using the distal internal carotid diameter as the denominator. CONTRAST:  74mL GADAVIST GADOBUTROL 1 MMOL/ML IV SOLN COMPARISON:  CT head from the same day. FINDINGS: MR HEAD FINDINGS Brain: Acute left cerebellar infarct. Mild edema without significant mass effect. Additional mild to moderate scattered T2 hyperintensities in the white matter, nonspecific but compatible with chronic microvascular ischemic disease. No hydrocephalus, mass lesion, midline shift, or acute hemorrhage. Punctate  focus of susceptibility artifact in the left periventricular frontal lobe, probably the sequela of prior microhemorrhage. Probable small remote lacunar infarct in left corona radiata. Vascular: See below. Skull and upper cervical spine: Normal marrow signal. Sinuses/Orbits: Mild paranasal sinus mucosal thickening. Other: No mastoid effusions MRA HEAD FINDINGS Anterior circulation: Bilateral intracranial ICAs are patent with mild paraclinoid ICA stenosis. Severe stenosis versus short-segment occlusion of the distal left M1 MCA. Bilateral proximal M2 MCA branches are patent. Bilateral ACAs are patent. Small right A1 ACA. Posterior circulation: Poor flow related signal proximally (see findings in MRA neck below). 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 Right intradural vertebral artery is patent. Multifocal mild to moderate narrowing of a small and irregular basilar artery with fetal like PCAs. MRA NECK FINDINGS Aortic arch: Great vessel origins are patent. Right carotid system: Patent. No significant (greater than 50%) stenosis. Left carotid system: Patent. No significant (greater than 50%) stenosis. Vertebral arteries: Multifocal irregularity of the left vertebral artery with multifocal severe stenosis. Right vertebral artery is patent without significant stenosis. Bilateral PCAs are patent with moderate left P2 PCA stenosis. Other: None. Brain: Acute infarct in the left cerebellum. IMPRESSION: MRI: 1. Acute left cerebellar infarct.  Mild edema without mass effect. 2. Mild-to-moderate chronic microvascular ischemic disease MRA head: 1. Severe stenosis versus short-segment occlusion of the distal left M1 MCA with preserved flow related signal and proximal M2 branches. 2. 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 3. Moderate left  P2 PCA stenosis. 4. 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 MRA neck: 1. Multifocal  irregularity of the left vertebral artery in the neck with multifocal severe stenosis. Given the relative paucity of atherosclerosis/narrowing in the neck, dissection is a differential consideration. A CTA neck could further evaluate if clinically indicated. 2. Otherwise, no significant (greater than 50%) stenosis in the neck. Electronically Signed: By: Margaretha Sheffield M.D. On: 11/08/2021 16:52       LOS: 1 day   [facility] Pager on [email or web address]  11/09/2021, 9:47 AM

## 2021-11-09 NOTE — Evaluation (Addendum)
Clinical/Bedside Swallow Evaluation Patient Details  Name: Samantha [name] MRN: [id number] Date of Birth: March 03, 1951  Today's Date: 11/09/2021 Time: SLP Start Time (ACUTE ONLY): 1315 SLP Stop Time (ACUTE ONLY): 1338 SLP Time Calculation (min) (ACUTE ONLY): 23 min  Past Medical History:  Past Medical History:  Diagnosis Date   Acute pyelonephritis    Bacteremia, escherichia coli 01/12/2015   Chest pain    Critical lower limb ischemia (HCC)    Hyperlipidemia    Hypertension    Left carotid bruit    PAD (peripheral artery disease) (HCC)    Type II diabetes mellitus (HCC)    Past Surgical History:  Past Surgical History:  Procedure Laterality Date   ABDOMINAL ANGIOGRAM  05/16/2015   Procedure: Abdominal Angiogram;  Surgeon: Runell Gess, MD;  Location: [facility];  Service: Cardiovascular;;   ABDOMINAL HYSTERECTOMY  ~ 2000   PERIPHERAL VASCULAR CATHETERIZATION Bilateral 05/16/2015   Procedure: Lower Extremity Angiography;  Surgeon: Runell Gess, MD; renal arteries widely patent, L-SFA 75%, L-pop 95%, L-peroneal 90%; R-SFA 40%, R-pop 50%, R-prox peroneal 90%; directional atherectomy L-SFA, p-pop, drug-eluting balloon angioplasty reducing the stenoses to 0, small linear dissection was not flow limiting       PERIPHERAL VASCULAR CATHETERIZATION Left 05/16/2015   Procedure: Peripheral Vascular Atherectomy;  Surgeon: Runell Gess, MD;  Location: [facility];  Service: Cardiovascular;  Laterality: Left;  sfa   HPI:  70 y.o. female with medical history significant of DM2, HTN, HLD. Her symptoms started 3 days ago with dizziness. It came in waves. She had no specific activity that caused it. She just noticed that she would have episodes of lightheadedness and dizziness that would cause her to have to slow down and collect herself.  She did not have visual or auditory changes at the time. She did not take any specific medicines. She says that she had left arm numbness  and spasm starting yesterday. No specific activity brought it no. It also came in short waves. Everything seemed to come together this morning around 8 am. She noticed that she has a sudden loss of strength in her left leg while walking. She had to sit to compose herself. She felt spasms in the left arm and leg. She had numbness in the same areas. She had black spot and floaters. She felt dizzy. She spoke with her daughter and they came to the ED for help; MRI 11/08/21 indicated  Acute left cerebellar infarct.  Mild edema without mass effect; Passed [facility] swallow screen; no recent CXR noted in chart.  BSE generated    Assessment / Plan / Recommendation  Clinical Impression  Pt seen for clinical swallowing evaluation with lunch tray administration with adequate oral control/propulsion, timely swallow and no overt s/s of aspiration noted throughout the evaluation with thin via straw, puree and solids.  Vocal quality adequate within simple conversation.  OME unremarkable.  Pt with missing dentition, but this did not impede mastication with solids.  Pt denies previous dysphagia and daughter confirmed information. Recommend continuing Regular/thin liquids with general swallowing precautions in place.  ST will s/o for swallowing needs in acute setting.  Thank you for this consult.  SLP Visit Diagnosis: Dysphagia, unspecified (R13.10)    Aspiration Risk  No limitations    Diet Recommendation   Regular consistency/thin liquids  Medication Administration: Whole meds with liquid    Other  Recommendations Oral Care Recommendations: Oral care BID    Recommendations for follow up therapy are  one component of a multi-disciplinary discharge planning process, led by the attending physician.  Recommendations may be updated based on patient status, additional functional criteria and insurance authorization.  Follow up Recommendations No SLP follow up      Assistance Recommended at Discharge Other (comment) (TBD)   Functional Status Assessment    Frequency and Duration  (Evaluation only)          Prognosis Prognosis for Safe Diet Advancement: Good      Swallow Study   General Date of Onset: 11/08/21 HPI: 70 y.o. female with medical history significant of DM2, HTN, HLD. Her symptoms started 3 days ago with dizziness. It came in waves. She had no specific activity that caused it. She just noticed that she would have episodes of lightheadedness and dizziness that would cause her to have to slow down and collect herself.  She did not have visual or auditory changes at the time. She did not take any specific medicines. She says that she had left arm numbness and spasm starting yesterday. No specific activity brought it no. It also came in short waves. Everything seemed to come together this morning around 8 am. She noticed that she has a sudden loss of strength in her left leg while walking. She had to sit to compose herself. She felt spasms in the left arm and leg. She had numbness in the same areas. She had black spot and floaters. She felt dizzy. She spoke with her daughter and they came to the ED for help; MRI 11/08/21 indicated  Acute left cerebellar infarct.  Mild edema without mass effect; Passed [facility] swallow screen; BSE generated Type of Study: Bedside Swallow Evaluation Previous Swallow Assessment: [facility] passed 11/08/21 Diet Prior to this Study: Regular;Thin liquids Temperature Spikes Noted: No Respiratory Status: Room air History of Recent Intubation: No Behavior/Cognition: Alert;Cooperative Oral Cavity Assessment: Within Functional Limits Oral Care Completed by SLP: No (pt eating lunch when SLP arrived) Oral Cavity - Dentition: Missing dentition Vision: Functional for self-feeding Self-Feeding Abilities: Able to feed self Patient Positioning: Upright in bed Baseline Vocal Quality: Normal Volitional Cough: Strong Volitional Swallow: Able to elicit    Oral/Motor/Sensory Function Overall Oral  Motor/Sensory Function: Within functional limits   Ice Chips Ice chips: Not tested   Thin Liquid Thin Liquid: Within functional limits Presentation: Straw    Nectar Thick Nectar Thick Liquid: Not tested   Honey Thick Honey Thick Liquid: Not tested   Puree Puree: Within functional limits Presentation: Self Fed   Solid     Solid: Within functional limits Presentation: Self Fed      Tressie Stalker, M.S., CCC-SLP 11/09/2021,1:58 PM

## 2021-11-09 NOTE — Progress Notes (Signed)
PT Cancellation Note  Patient Details Name: Samantha [name] MRN: [id number] DOB: Dec 18, 1951   Cancelled Treatment:    Reason Eval/Treat Not Completed: Patient not medically ready Noted updated CTA report stating "Strong Consideration" for vertebral artery dissection.  Will await Neuro recommendations/plan before proceeding with comprehensive Physical Therapy evaluation.    Berton Mount 11/09/2021, 2:34 PM

## 2021-11-10 ENCOUNTER — Other Ambulatory Visit: Payer: Self-pay

## 2021-11-10 LAB — GLUCOSE, CAPILLARY
Glucose-Capillary: 226 mg/dL — ABNORMAL HIGH (ref 70–99)
Glucose-Capillary: 270 mg/dL — ABNORMAL HIGH (ref 70–99)

## 2021-11-10 LAB — BASIC METABOLIC PANEL
Anion gap: 8 (ref 5–15)
BUN: 22 mg/dL (ref 8–23)
CO2: 21 mmol/L — ABNORMAL LOW (ref 22–32)
Calcium: 8.8 mg/dL — ABNORMAL LOW (ref 8.9–10.3)
Chloride: 107 mmol/L (ref 98–111)
Creatinine, Ser: 1.72 mg/dL — ABNORMAL HIGH (ref 0.44–1.00)
GFR, Estimated: 32 mL/min — ABNORMAL LOW (ref >60)
Glucose, Bld: 199 mg/dL — ABNORMAL HIGH (ref 70–99)
Potassium: 4 mmol/L (ref 3.5–5.1)
Sodium: 136 mmol/L (ref 135–145)

## 2021-11-10 LAB — CBC
HCT: 36.2 % (ref 36.0–46.0)
Hemoglobin: 12.5 g/dL (ref 12.0–15.0)
MCH: 32.1 pg (ref 26.0–34.0)
MCHC: 34.5 g/dL (ref 30.0–36.0)
MCV: 93.1 fL (ref 80.0–100.0)
Platelets: 327 10*3/uL (ref 150–400)
RBC: 3.89 MIL/uL (ref 3.87–5.11)
RDW: 12 % (ref 11.5–15.5)
WBC: 9.3 10*3/uL (ref 4.0–10.5)
nRBC: 0 % (ref 0.0–0.2)

## 2021-11-10 MED ORDER — CLOPIDOGREL BISULFATE 75 MG PO TABS
75.0000 mg | ORAL_TABLET | Freq: Every day | ORAL | 3 refills | Status: DC
  Filled 2021-11-10: qty 30, 30d supply, fill #0
  Filled 2021-11-26: qty 30, 30d supply, fill #1
  Filled 2022-01-01: qty 30, 30d supply, fill #2
  Filled 2022-01-01: qty 30, 30d supply, fill #0

## 2021-11-10 MED ORDER — ATORVASTATIN CALCIUM 80 MG PO TABS
80.0000 mg | ORAL_TABLET | Freq: Every day | ORAL | 3 refills | Status: DC
  Filled 2021-11-10: qty 30, 30d supply, fill #0
  Filled 2021-11-26: qty 30, 30d supply, fill #1
  Filled 2022-01-01: qty 30, 30d supply, fill #2
  Filled 2022-01-01: qty 30, 30d supply, fill #0

## 2021-11-10 MED ORDER — ASPIRIN 325 MG PO TABS
325.0000 mg | ORAL_TABLET | Freq: Every day | ORAL | 2 refills | Status: AC
  Filled 2021-11-10: qty 30, 30d supply, fill #0

## 2021-11-10 NOTE — Progress Notes (Signed)
   11/09/21 2354  Provider Notification  Provider Name/Title Dr Imogene Burn  Date Provider Notified 11/09/21  Time Provider Notified 2354  Notification Type Page  Notification Reason Other (Comment) (pt' SBP still in the 170s)  Provider response See new orders  Date of Provider Response 11/09/21  Time of Provider Response 2356

## 2021-11-10 NOTE — Discharge Summary (Signed)
[facility]  Physician Discharge Summary   Patient ID: Samantha [name] MRN: [id number] DOB/AGE: [birth year] 70 y.o.  Admit date: 11/08/2021 Discharge date: 11/10/2021    PCP: Charlott Rakes, MD  DISCHARGE DIAGNOSES:  Acute stroke Diabetes mellitus type 2, uncontrolled with hyperglycemia Diabetic neuropathy Essential hypertension Thoracic aortic aneurysm Elevated creatinine  RECOMMENDATIONS FOR OUTPATIENT FOLLOW UP: Needs surveillance for small aortic root aneurysm noted on echocardiogram Ambulatory referral sent to neurology Monitor renal function closely in the outpatient setting.   Home Health: Outpatient PT and OT Equipment/Devices: None  CODE STATUS: Full code  DISCHARGE CONDITION: fair  Diet recommendation: Modified carbohydrate  INITIAL HISTORY: 70 y.o. female with medical history significant of DM2, HTN, HLD. Her symptoms started 3 days ago with dizziness.  She also mention left-sided numbness.  Evaluation in the emergency department raise concern for acute stroke.  She was hospitalized for further management.    Reason for Visit: Acute stroke   Consultants: Neurology   Procedures: Transthoracic echocardiogram    HOSPITAL COURSE:   Acute stroke Patient presented with dizziness and left-sided numbness.  MRI showed acute left cerebellar infarct.  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  Concern was also raised for dissection in the left vertebral artery.  Seen by neurology.  LDL noted to be 67.  Statin was continued.  HbA1c 8.9. Neurology recommended aspirin and Plavix for 3 months followed by Plavix alone.  Increased Lipitor to 80 mg daily.  High blood pressure goal of 1 30-1 50 considering multifocal intracranial large vessel stenosis.  Seen by physical occupational and speech therapy.  Outpatient therapy recommended.   Diabetes mellitus type 2, uncontrolled with hyperglycemia/diabetic neuropathy HbA1c 8.9.  Continue home  medications.  Essential hypertension Permissive hypertension was allowed in the hospital.  May resume home medications   Elevated creatinine Likely has chronic kidney disease, unknown stage.  Outpatient follow-up  Hyperlipidemia LDL noted to be 67.  Dose of Lipitor has been increased.   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 Aortic dilatation noted.  Will need surveillance in the outpatient setting.  Patient is stable.  Okay for discharge home today.   PERTINENT LABS:  The results of significant diagnostics from this hospitalization (including imaging, microbiology, ancillary and laboratory) are listed below for reference.    Microbiology: Recent Results (from the past 240 hour(s))  Resp Panel by RT-PCR (Flu A&B, Covid) Nasopharyngeal Swab     Status: None   Collection Time: 11/08/21  9:41 AM   Specimen: Nasopharyngeal Swab; Nasopharyngeal(NP) swabs in vial transport medium  Result Value Ref Range Status   SARS Coronavirus 2 by RT PCR NEGATIVE NEGATIVE Final    Comment: (NOTE) SARS-CoV-2 target nucleic acids are NOT DETECTED.  The SARS-CoV-2 RNA is generally detectable in upper respiratory specimens during the acute phase of infection. The lowest concentration of SARS-CoV-2 viral copies this assay can detect is 138 copies/mL. A negative result does not preclude SARS-Cov-2 infection and should not be used as the sole basis for treatment or other patient management decisions. A negative result may occur with  improper specimen collection/handling, submission of specimen other than nasopharyngeal swab, presence of viral mutation(s) within the areas targeted by this assay, and inadequate number of viral copies(<138 copies/mL). A negative result must be combined with clinical observations, patient history, and epidemiological information. The expected result is Negative.  Fact Sheet for Patients:  EntrepreneurPulse.com.au  Fact Sheet for Healthcare  Providers:  IncredibleEmployment.be  This test is no t yet  approved or cleared by the [place] and  has been authorized for detection and/or diagnosis of SARS-CoV-2 by FDA under an Emergency Use Authorization (EUA). This EUA will remain  in effect (meaning this test can be used) for the duration of the COVID-19 declaration under Section 564(b)(1) of the Act, 21 U.S.C.section 360bbb-3(b)(1), unless the authorization is terminated  or revoked sooner.       Influenza A by PCR NEGATIVE NEGATIVE Final   Influenza B by PCR NEGATIVE NEGATIVE Final    Comment: (NOTE) The Xpert Xpress SARS-CoV-2/FLU/RSV plus assay is intended as an aid in the diagnosis of influenza from Nasopharyngeal swab specimens and should not be used as a sole basis for treatment. Nasal washings and aspirates are unacceptable for Xpert Xpress SARS-CoV-2/FLU/RSV testing.  Fact Sheet for Patients: EntrepreneurPulse.com.au  Fact Sheet for Healthcare Providers: IncredibleEmployment.be  This test is not yet approved or cleared by the [place] FDA and has been authorized for detection and/or diagnosis of SARS-CoV-2 by FDA under an Emergency Use Authorization (EUA). This EUA will remain in effect (meaning this test can be used) for the duration of the COVID-19 declaration under Section 564(b)(1) of the Act, 21 U.S.C. section 360bbb-3(b)(1), unless the authorization is terminated or revoked.  Performed at [facility], [place] [address], [place], [place] [address]      Labs:  COVID-19 Labs   Lab Results  Component Value Date   [place] NEGATIVE 11/08/2021      Basic Metabolic Panel: Recent Labs  Lab 11/08/21 0935 11/08/21 1119 11/10/21 0330  NA 133* 141 136  K 4.3 3.5 4.0  CL 102 107 107  CO2 21*  --  21*  GLUCOSE 261* 188* 199*  BUN 24* 22 22  CREATININE 1.57* 1.50* 1.72*  CALCIUM 9.8  --  8.8*  MG 1.9  --    --    Liver Function Tests: Recent Labs  Lab 11/08/21 0935  AST 23  ALT 24  ALKPHOS 82  BILITOT 0.8  PROT 7.5  ALBUMIN 4.2    CBC: Recent Labs  Lab 11/08/21 0935 11/08/21 1119 11/10/21 0330  WBC 9.5  --  9.3  NEUTROABS 7.2  --   --   HGB 13.7 11.2* 12.5  HCT 38.6 33.0* 36.2  MCV 92.1  --  93.1  PLT 377  --  327     CBG: Recent Labs  Lab 11/09/21 1147 11/09/21 1622 11/09/21 2132 11/10/21 0653 11/10/21 1147  GLUCAP 237* 231* 177* 226* 270*     IMAGING STUDIES CT ANGIO HEAD NECK W WO CM  Result Date: 11/09/2021 CLINICAL DATA:  Vertebral dissection suspected EXAM: CT ANGIOGRAPHY HEAD AND NECK TECHNIQUE: Multidetector CT imaging of the head and neck was performed using the standard protocol during bolus administration of intravenous contrast. Multiplanar CT image reconstructions and MIPs were obtained to evaluate the vascular anatomy. Carotid stenosis measurements (when applicable) are obtained utilizing NASCET criteria, using the distal internal carotid diameter as the denominator. CONTRAST:  95mL OMNIPAQUE IOHEXOL 350 MG/ML SOLN COMPARISON:  Brain MRI and MRA from yesterday FINDINGS: CT HEAD FINDINGS Brain: Acute left superior cerebellar territory are infarct. No acute hemorrhage. No hydrocephalus or masslike finding Vascular: See below Skull: Benign Sinuses: Benign Orbits: Negative Review of the MIP images confirms the above findings CTA NECK FINDINGS Aortic arch: Atheromatous plaque.  Two vessel branching. Right carotid system: Atheromatous calcification at the bifurcation. No stenosis or ulceration. Left carotid system: Atheromatous plaque at the bifurcation. No  stenosis or ulceration Vertebral arteries: Proximal subclavian atherosclerosis especially on the left where there is a low-density plaque and vessel kink causing 65% narrowing. Intermittent thready flow in the left vertebral artery with faint density especially at the V3 segment, dissection is again suspected.  Skeleton: Negative Other neck: Negative Upper chest: Negative Review of the MIP images confirms the above findings CTA HEAD FINDINGS Anterior circulation: Atheromatous plaque affecting the carotid siphons. Notable right paraclinoid ICA stenosis measuring ~50%. Severe stenosis the left M1 segment with symmetric density of downstream vessels. Negative for aneurysm Posterior circulation: Small vertebral and basilar arteries in the setting of fetal type circulation. Minimal faint flow in the left vertebral artery with high-grade distal narrowing before the basilar. Diffusely small basilar from fetal type circulation with tandem at least moderate stenoses best seen on thick MIPS. Moderate left P2 and bilateral P3 segment stenosis. Venous sinuses: Unremarkable in the arterial phase Anatomic variants: As above Review of the MIP images confirms the above findings IMPRESSION: 1. Stable from MRI/MRA yesterday. 2. Intermittent severe stenosis of the left vertebral artery with minimal intradural contribution, dissection is again a strong consideration given the degree of irregularity. 65% stenosis of the proximal left subclavian artery from atherosclerosis and vessel kink. 3. Severe left M1 segment stenosis. 4. At least moderate atheromatous narrowings of the proximal basilar. Moderate bilateral PCA narrowings. Electronically Signed   By: Jorje Guild M.D.   On: 11/09/2021 10:10   CT Head Wo Contrast  Result Date: 11/08/2021 CLINICAL DATA:  Neuro deficits EXAM: CT HEAD WITHOUT CONTRAST TECHNIQUE: Contiguous axial images were obtained from the base of the skull through the vertex without intravenous contrast. COMPARISON:  None. FINDINGS: Brain: Ventricles are not dilated. There is no shift of midline structures. There are no signs of recent bleeding within the cranium. There is low-density in the upper left cerebellum. There is subtle decreased density in the posterior left parieto-occipital cortex. There is decreased  density in the periventricular white matter. Cortical sulci are slightly prominent. Vascular: Unremarkable Skull: Unremarkable Sinuses/Orbits: There is mucosal thickening in the ethmoid and maxillary sinuses. Other: There is increased amount of CSF insula suggesting partial empty sella. IMPRESSION: There are no signs of bleeding within the cranium. Low-density foci is seen in the upper left cerebellum and possibly in the left posterior parieto-occipital cortex. Findings suggest acute or chronic ischemia in the left PCA circulation. Neurological evaluation and MRI should be considered. Other findings as described in the body of the report. Imaging findings were relayed to patient's provider Theodis Blaze by telephone call at 10:44 a.m. on 11/08/2021. Electronically Signed   By: Elmer Picker M.D.   On: 11/08/2021 10:48   MR ANGIO HEAD WO CONTRAST  Addendum Date: 11/08/2021   ADDENDUM REPORT: 11/08/2021 17:07 ADDENDUM: Findings discussed with Dr. Cherylann Ratel via telephone at 5:00 p.m. Electronically Signed   By: Margaretha Sheffield M.D.   On: 11/08/2021 17:07   Result Date: 11/08/2021 CLINICAL DATA:  Neuro deficit, acute, stroke suspected EXAM: MRI HEAD WITHOUT CONTRAST MRA HEAD WITHOUT CONTRAST MRA OF THE NECK WITHOUT AND WITH CONTRAST TECHNIQUE: Multiplanar, multi-echo pulse sequences of the brain and surrounding structures were acquired without intravenous contrast. Angiographic images of the Circle of Willis were acquired using MRA technique without intravenous contrast. Angiographic images of the neck were acquired using MRA technique without and with intravenous contrast. Carotid stenosis measurements (when applicable) are obtained utilizing NASCET criteria, using the distal internal carotid diameter as the denominator. CONTRAST:  93mL GADAVIST  GADOBUTROL 1 MMOL/ML IV SOLN COMPARISON:  CT head from the same day. FINDINGS: MR HEAD FINDINGS Brain: Acute left cerebellar infarct. Mild edema without  significant mass effect. Additional mild to moderate scattered T2 hyperintensities in the white matter, nonspecific but compatible with chronic microvascular ischemic disease. No hydrocephalus, mass lesion, midline shift, or acute hemorrhage. Punctate focus of susceptibility artifact in the left periventricular frontal lobe, probably the sequela of prior microhemorrhage. Probable small remote lacunar infarct in left corona radiata. Vascular: See below. Skull and upper cervical spine: Normal marrow signal. Sinuses/Orbits: Mild paranasal sinus mucosal thickening. Other: No mastoid effusions MRA HEAD FINDINGS Anterior circulation: Bilateral intracranial ICAs are patent with mild paraclinoid ICA stenosis. Severe stenosis versus short-segment occlusion of the distal left M1 MCA. Bilateral proximal M2 MCA branches are patent. Bilateral ACAs are patent. Small right A1 ACA. Posterior circulation: Poor flow related signal proximally (see findings in MRA neck below). 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 Right intradural vertebral artery is patent. Multifocal mild to moderate narrowing of a small and irregular basilar artery with fetal like PCAs. MRA NECK FINDINGS Aortic arch: Great vessel origins are patent. Right carotid system: Patent. No significant (greater than 50%) stenosis. Left carotid system: Patent. No significant (greater than 50%) stenosis. Vertebral arteries: Multifocal irregularity of the left vertebral artery with multifocal severe stenosis. Right vertebral artery is patent without significant stenosis. Bilateral PCAs are patent with moderate left P2 PCA stenosis. Other: None. Brain: Acute infarct in the left cerebellum. IMPRESSION: MRI: 1. Acute left cerebellar infarct.  Mild edema without mass effect. 2. Mild-to-moderate chronic microvascular ischemic disease MRA head: 1. Severe stenosis versus short-segment occlusion of the distal left M1 MCA with preserved flow related signal and  proximal M2 branches. 2. 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 3. Moderate left P2 PCA stenosis. 4. 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 MRA neck: 1. Multifocal irregularity of the left vertebral artery in the neck with multifocal severe stenosis. Given the relative paucity of atherosclerosis/narrowing in the neck, dissection is a differential consideration. A CTA neck could further evaluate if clinically indicated. 2. Otherwise, no significant (greater than 50%) stenosis in the neck. Electronically Signed: By: Margaretha Sheffield M.D. On: 11/08/2021 16:52   MR Angiogram Neck W or Wo Contrast  Addendum Date: 11/08/2021   ADDENDUM REPORT: 11/08/2021 17:07 ADDENDUM: Findings discussed with Dr. Cherylann Ratel via telephone at 5:00 p.m. Electronically Signed   By: Margaretha Sheffield M.D.   On: 11/08/2021 17:07   Result Date: 11/08/2021 CLINICAL DATA:  Neuro deficit, acute, stroke suspected EXAM: MRI HEAD WITHOUT CONTRAST MRA HEAD WITHOUT CONTRAST MRA OF THE NECK WITHOUT AND WITH CONTRAST TECHNIQUE: Multiplanar, multi-echo pulse sequences of the brain and surrounding structures were acquired without intravenous contrast. Angiographic images of the Circle of Willis were acquired using MRA technique without intravenous contrast. Angiographic images of the neck were acquired using MRA technique without and with intravenous contrast. Carotid stenosis measurements (when applicable) are obtained utilizing NASCET criteria, using the distal internal carotid diameter as the denominator. CONTRAST:  59mL GADAVIST GADOBUTROL 1 MMOL/ML IV SOLN COMPARISON:  CT head from the same day. FINDINGS: MR HEAD FINDINGS Brain: Acute left cerebellar infarct. Mild edema without significant mass effect. Additional mild to moderate scattered T2 hyperintensities in the white matter, nonspecific but compatible with chronic microvascular ischemic disease. No  hydrocephalus, mass lesion, midline shift, or acute hemorrhage. Punctate focus of susceptibility artifact in  the left periventricular frontal lobe, probably the sequela of prior microhemorrhage. Probable small remote lacunar infarct in left corona radiata. Vascular: See below. Skull and upper cervical spine: Normal marrow signal. Sinuses/Orbits: Mild paranasal sinus mucosal thickening. Other: No mastoid effusions MRA HEAD FINDINGS Anterior circulation: Bilateral intracranial ICAs are patent with mild paraclinoid ICA stenosis. Severe stenosis versus short-segment occlusion of the distal left M1 MCA. Bilateral proximal M2 MCA branches are patent. Bilateral ACAs are patent. Small right A1 ACA. Posterior circulation: Poor flow related signal proximally (see findings in MRA neck below). 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 Right intradural vertebral artery is patent. Multifocal mild to moderate narrowing of a small and irregular basilar artery with fetal like PCAs. MRA NECK FINDINGS Aortic arch: Great vessel origins are patent. Right carotid system: Patent. No significant (greater than 50%) stenosis. Left carotid system: Patent. No significant (greater than 50%) stenosis. Vertebral arteries: Multifocal irregularity of the left vertebral artery with multifocal severe stenosis. Right vertebral artery is patent without significant stenosis. Bilateral PCAs are patent with moderate left P2 PCA stenosis. Other: None. Brain: Acute infarct in the left cerebellum. IMPRESSION: MRI: 1. Acute left cerebellar infarct.  Mild edema without mass effect. 2. Mild-to-moderate chronic microvascular ischemic disease MRA head: 1. Severe stenosis versus short-segment occlusion of the distal left M1 MCA with preserved flow related signal and proximal M2 branches. 2. 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 3. Moderate left P2 PCA stenosis. 4. Multifocal mild-to-moderate stenosis of a small  basilar artery with bilateral fetal like PCAs. MRA neck: 1. Multifocal irregularity of the left vertebral artery in the neck with multifocal severe stenosis. Given the relative paucity of atherosclerosis/narrowing in the neck, dissection is a differential consideration. A CTA neck could further evaluate if clinically indicated. 2. Otherwise, no significant (greater than 50%) stenosis in the neck. Electronically Signed: By: Feliberto Harts M.D. On: 11/08/2021 16:52   MR Brain Wo Contrast (neuro protocol)  Addendum Date: 11/08/2021   ADDENDUM REPORT: 11/08/2021 17:07 ADDENDUM: Findings discussed with Dr. Margie Ege via telephone at 5:00 p.m. Electronically Signed   By: Feliberto Harts M.D.   On: 11/08/2021 17:07   Result Date: 11/08/2021 CLINICAL DATA:  Neuro deficit, acute, stroke suspected EXAM: MRI HEAD WITHOUT CONTRAST MRA HEAD WITHOUT CONTRAST MRA OF THE NECK WITHOUT AND WITH CONTRAST TECHNIQUE: Multiplanar, multi-echo pulse sequences of the brain and surrounding structures were acquired without intravenous contrast. Angiographic images of the Circle of Willis were acquired using MRA technique without intravenous contrast. Angiographic images of the neck were acquired using MRA technique without and with intravenous contrast. Carotid stenosis measurements (when applicable) are obtained utilizing NASCET criteria, using the distal internal carotid diameter as the denominator. CONTRAST:  65mL GADAVIST GADOBUTROL 1 MMOL/ML IV SOLN COMPARISON:  CT head from the same day. FINDINGS: MR HEAD FINDINGS Brain: Acute left cerebellar infarct. Mild edema without significant mass effect. Additional mild to moderate scattered T2 hyperintensities in the white matter, nonspecific but compatible with chronic microvascular ischemic disease. No hydrocephalus, mass lesion, midline shift, or acute hemorrhage. Punctate focus of susceptibility artifact in the left periventricular frontal lobe, probably the sequela of prior  microhemorrhage. Probable small remote lacunar infarct in left corona radiata. Vascular: See below. Skull and upper cervical spine: Normal marrow signal. Sinuses/Orbits: Mild paranasal sinus mucosal thickening. Other: No mastoid effusions MRA HEAD FINDINGS Anterior circulation: Bilateral intracranial ICAs are patent with mild paraclinoid ICA stenosis. Severe stenosis versus short-segment occlusion of the distal  left M1 MCA. Bilateral proximal M2 MCA branches are patent. Bilateral ACAs are patent. Small right A1 ACA. Posterior circulation: Poor flow related signal proximally (see findings in MRA neck below). 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 Right intradural vertebral artery is patent. Multifocal mild to moderate narrowing of a small and irregular basilar artery with fetal like PCAs. MRA NECK FINDINGS Aortic arch: Great vessel origins are patent. Right carotid system: Patent. No significant (greater than 50%) stenosis. Left carotid system: Patent. No significant (greater than 50%) stenosis. Vertebral arteries: Multifocal irregularity of the left vertebral artery with multifocal severe stenosis. Right vertebral artery is patent without significant stenosis. Bilateral PCAs are patent with moderate left P2 PCA stenosis. Other: None. Brain: Acute infarct in the left cerebellum. IMPRESSION: MRI: 1. Acute left cerebellar infarct.  Mild edema without mass effect. 2. Mild-to-moderate chronic microvascular ischemic disease MRA head: 1. Severe stenosis versus short-segment occlusion of the distal left M1 MCA with preserved flow related signal and proximal M2 branches. 2. 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 3. Moderate left P2 PCA stenosis. 4. 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 MRA neck: 1. Multifocal irregularity of the left vertebral artery in the neck with multifocal severe stenosis. Given the relative  paucity of atherosclerosis/narrowing in the neck, dissection is a differential consideration. A CTA neck could further evaluate if clinically indicated. 2. Otherwise, no significant (greater than 50%) stenosis in the neck. Electronically Signed: By: Margaretha Sheffield M.D. On: 11/08/2021 16:52   ECHOCARDIOGRAM COMPLETE  Result Date: 11/09/2021    ECHOCARDIOGRAM REPORT   Patient Name:   Samantha [name] Date of Exam: 11/09/2021 Medical Rec #:  [id number]          Height:       64.0 in Accession #:    [id number]         Weight:       161.2 lb Date of Birth:  10-Nov-1951          BSA:          1.785 m Patient Age:    55 years           BP:           192/78 mmHg Patient Gender: F                  HR:           64 bpm. Exam Location:  Inpatient Procedure: 2D Echo, Cardiac Doppler, Color Doppler and Intracardiac            Opacification Agent Indications:    stroke work up  History:        Patient has prior history of Echocardiogram examinations.  Sonographer:    Ula Lingo Referring Phys: [id number] [place]  1. Left ventricular ejection fraction, by estimation, is 60 to 65%. The left ventricle has normal function. The left ventricle has no regional wall motion abnormalities. There is moderate left ventricular hypertrophy. Left ventricular diastolic parameters are consistent with Grade I diastolic dysfunction (impaired relaxation).  2. Right ventricular systolic function is normal. The right ventricular size is normal. Tricuspid regurgitation signal is inadequate for assessing PA pressure.  3. The mitral valve is normal in structure. No evidence of mitral valve regurgitation.  4. The aortic valve is normal in structure. Aortic valve regurgitation is not visualized. No aortic stenosis is present.  5. 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  Aortic dilatation noted.  6. The inferior vena cava is normal in size with greater than 50% respiratory variability, suggesting right atrial pressure of 3 mmHg.  Conclusion(s)/Recommendation(s): Normal biventricular function without evidence of hemodynamically significant valvular heart disease. FINDINGS  Left Ventricle: Left ventricular ejection fraction, by estimation, is 60 to 65%. The left ventricle has normal function. The left ventricle has no regional wall motion abnormalities. The left ventricular internal cavity size was normal in size. There is  moderate left ventricular hypertrophy. Left ventricular diastolic parameters are consistent with Grade I diastolic dysfunction (impaired relaxation). Right Ventricle: The right ventricular size is normal. No increase in right ventricular wall thickness. Right ventricular systolic function is normal. Tricuspid regurgitation signal is inadequate for assessing PA pressure. Left Atrium: Left atrial size was normal in size. Right Atrium: Right atrial size was normal in size. Pericardium: There is no evidence of pericardial effusion. Mitral Valve: The mitral valve is normal in structure. No evidence of mitral valve regurgitation. Tricuspid Valve: The tricuspid valve is not well visualized. Tricuspid valve regurgitation is not demonstrated. Aortic Valve: The aortic valve is normal in structure. Aortic valve regurgitation is not visualized. No aortic stenosis is present. Pulmonic Valve: The pulmonic valve was not well visualized. Pulmonic valve regurgitation is not visualized. 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 Venous: The inferior vena cava is normal in size with greater than 50% respiratory variability, suggesting right atrial pressure of 3 mmHg. IAS/Shunts: No atrial level shunt detected by color flow Doppler.  LEFT VENTRICLE PLAX 2D LVIDd:         4.40 cm   Diastology LVIDs:         3.20 cm   LV e' medial:    3.81 cm/s LV PW:         1.30 cm   LV E/e' medial:  18.0 LV IVS:        1.50 cm   LV e' lateral:   7.29 cm/s LVOT diam:     1.90 cm   LV E/e' lateral: 9.4 LV SV:         42 LV SV Index:   24 LVOT  Area:     2.84 cm  RIGHT VENTRICLE TAPSE (M-mode): 1.7 cm LEFT ATRIUM             Index LA diam:        3.60 cm 2.02 cm/m LA Vol (A2C):   46.7 ml 26.17 ml/m LA Vol (A4C):   35.8 ml 20.06 ml/m LA Biplane Vol: 41.2 ml 23.08 ml/m  AORTIC VALVE LVOT Vmax:   73.80 cm/s LVOT Vmean:  51.600 cm/s LVOT VTI:    0.149 m  AORTA Ao Root diam: 3.10 cm MITRAL VALVE MV Area (PHT): 2.39 cm     SHUNTS MV Decel Time: 318 msec     Systemic VTI:  0.15 m MV E velocity: 68.60 cm/s   Systemic Diam: 1.90 cm MV A velocity: 116.00 cm/s MV E/A ratio:  0.59 Landscape architect signed by Phineas Inches Signature Date/Time: 11/09/2021/2:32:29 PM    Final     DISCHARGE EXAMINATION: Vitals:   11/09/21 1942 11/10/21 0027 11/10/21 0420 11/10/21 0900  BP: (!) 175/73 (!) 179/72 (!) 177/66 (!) 175/84  Pulse: 64 69 68 76  Resp: 18 17 17 17   Temp: 97.8 F (36.6 C) 98.4 F (36.9 C) 98.4 F (36.9 C) 98.5 F (36.9 C)  TempSrc: Oral Oral Oral Oral  SpO2: 95% 96% 96% 99%  Weight:  Height:       General appearance: Awake alert.  In no distress Resp: Clear to auscultation bilaterally.  Normal effort Cardio: S1-S2 is normal regular.  No S3-S4.  No rubs murmurs or bruit GI: Abdomen is soft.  Nontender nondistended.  Bowel sounds are present normal.  No masses organomegaly    DISPOSITION: Home  Discharge Instructions     Ambulatory referral to Neurology   Complete by: As directed    Follow up with stroke clinic NP (Jessica Vanschaick or Cecille Rubin, if both not available, consider Zachery Dauer, or Ahern) at [facility] in about 4 weeks. Thanks.   Ambulatory referral to Occupational Therapy   Complete by: As directed    Ambulatory referral to Physical Therapy   Complete by: As directed    Call MD for:  difficulty breathing, headache or visual disturbances   Complete by: As directed    Call MD for:  extreme fatigue   Complete by: As directed    Call MD for:  persistant dizziness or light-headedness   Complete by: As  directed    Call MD for:  persistant nausea and vomiting   Complete by: As directed    Call MD for:  severe uncontrolled pain   Complete by: As directed    Call MD for:  temperature >100.4   Complete by: As directed    Diet - low sodium heart healthy   Complete by: As directed    Diet Carb Modified   Complete by: As directed    Discharge instructions   Complete by: As directed    Please be sure to follow-up with your primary care provider in 1 week.  A referral has been sent for outpatient follow-up with neurology.  Take your blood pressure medications and your medications for diabetes.  You were cared for by a hospitalist during your hospital stay. If you have any questions about your discharge medications or the care you received while you were in the hospital after you are discharged, you can call the unit and asked to speak with the hospitalist on call if the hospitalist that took care of you is not available. Once you are discharged, your primary care physician will handle any further medical issues. Please note that NO REFILLS for any discharge medications will be authorized once you are discharged, as it is imperative that you return to your primary care physician (or establish a relationship with a primary care physician if you do not have one) for your aftercare needs so that they can reassess your need for medications and monitor your lab values. If you do not have a primary care physician, you can call [phone] for a physician referral.   Increase activity slowly   Complete by: As directed          Allergies as of 11/10/2021       Reactions   Perflutren Other (See Comments)   Pt with back pain as soon as administered.        Medication List     STOP taking these medications    amLODipine 10 MG tablet Commonly known as: NORVASC   aspirin 81 MG EC tablet Replaced by: aspirin 325 MG tablet   FLUoxetine 20 MG tablet Commonly known as: PROZAC   gabapentin 300 MG  capsule Commonly known as: NEURONTIN   pneumococcal 13-valent conjugate vaccine Susp injection Commonly known as: PREVNAR 13       TAKE these medications    aspirin 325 MG tablet  Take 1 tablet (325 mg total) by mouth daily. For 3 months only Start taking on: November 11, 2021 Replaces: aspirin 81 MG EC tablet   atorvastatin 80 MG tablet Commonly known as: LIPITOR Tome 1 tableta (80 mg en total) por va oral diariamente. (Take 1 tablet (80 mg total) by mouth daily.) Start taking on: November 11, 2021 What changed:  medication strength how much to take   carvedilol 25 MG tablet Commonly known as: COREG Take 1 tablet (25 mg total) by mouth 2 (two) times daily with a meal.   clopidogrel 75 MG tablet Commonly known as: PLAVIX Tome 1 tableta (75 mg en total) por va oral diariamente. (Take 1 tablet (75 mg total) by mouth daily.) Start taking on: November 11, 2021   glimepiride 4 MG tablet Commonly known as: AMARYL Take 1 tablet (4 mg total) by mouth daily with breakfast.   glucose blood test strip Use 3 times daily before meals   HumuLIN 70/30 KwikPen (70-30) 100 UNIT/ML KwikPen Generic drug: insulin isophane & regular human KwikPen Inject subcutaneously twice daily 40 units in the morning and 25 units in the evening What changed:  how much to take how to take this when to take this additional instructions   Insulin Syringe-Needle U-100 31G X 15/64" 0.5 ML Misc Commonly known as: BD Insulin Syringe Ultrafine Use subcutaneously twice daily   losartan 100 MG tablet Commonly known as: COZAAR Tome 1 tableta por va oral diariamente. (TAKE 1 TABLET BY MOUTH DAILY.) What changed:  how much to take how to take this when to take this additional instructions   metFORMIN 1000 MG tablet Commonly known as: GLUCOPHAGE Take 1 tablet (1,000 mg total) by mouth 2 (two) times daily with a meal.   True Metrix Meter Devi 1 each by Does not apply route 3 (three) times daily  before meals.   TRUEplus Lancets 28G Misc 1 each by Does not apply route 3 (three) times daily before meals.   TRUEplus Pen Needles 31G X 6 MM Misc Generic drug: Insulin Pen Needle Inject 1 each into the skin 2 (two) times daily.          Follow-up Information     [facility]. Schedule an appointment as soon as possible for a visit in 1 month(s).   Specialty: Neurology Why: stroke clinic Contact information: [place] [phone]        Charlott Rakes, MD. Schedule an appointment as soon as possible for a visit in 1 week(s).   Specialty: Family Medicine Contact information: [place] [address] [phone]         [facility]. Schedule an appointment as soon as possible for a visit in 1 week(s).   Specialty: Rehabilitation Contact information: [place] [address] [place], [place] [id number] [place] [address] [phone]                TOTAL DISCHARGE TIME: 44 minutes  [place] Pager on [email or web address]  11/10/2021, 5:16 PM

## 2021-11-10 NOTE — Evaluation (Signed)
Occupational Therapy Evaluation Patient Details Name: Samantha [name] MRN: [id number] DOB: Apr 15, 1951 Today's Date: 11/10/2021   History of Present Illness 70 year old femaleadmitted for episode of vertigo, left-sided numbness and discoordination.  CT showed left cerebellum hypodensity.  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 Past medical history of hypertension, hyperlipidemia, PAD, diabetes   Clinical Impression   Samantha [name] was indep PTA, she lives in a 1 level home, 3 STE with her daughter. Pt now is requiring up to supervision assistance with ADLs and functional mobility within the room, she demonstrated good insight to safety. Pt is limited by LUE incoordination and weakness. She would benefit from continued OT acutely. Recommend d/c home with 24/7 supervision initially and OP OT follow up rehab to progress her L sided strength and coordination.    Recommendations for follow up therapy are one component of a multi-disciplinary discharge planning process, led by the attending physician.  Recommendations may be updated based on patient status, additional functional criteria and insurance authorization.   Follow Up Recommendations  Outpatient OT    Assistance Recommended at Discharge Intermittent Supervision/Assistance  Functional Status Assessment  Patient has had a recent decline in their functional status and demonstrates the ability to make significant improvements in function in a reasonable and predictable amount of time.  Equipment Recommendations  None recommended by OT    Recommendations for Other Services       Precautions / Restrictions Precautions Precautions: Fall Restrictions Weight Bearing Restrictions: No      Mobility Bed Mobility Overal bed mobility: Modified Independent                  Transfers Overall transfer level: Needs assistance    Transfers: Sit to/from Stand Sit to Stand: Supervision           General transfer comment: no LOB, good safety awareness      Balance Overall balance assessment: Needs assistance Sitting-balance support: Feet supported Sitting balance-Leahy Scale: Good     Standing balance support: No upper extremity supported;During functional activity Standing balance-Leahy Scale: Good                             ADL either performed or assessed with clinical judgement   ADL Overall ADL's : Needs assistance/impaired Eating/Feeding: Independent;Sitting   Grooming: Supervision/safety;Standing   Upper Body Bathing: Set up;Sitting   Lower Body Bathing: Supervison/ safety;Sit to/from stand   Upper Body Dressing : Set up;Sitting   Lower Body Dressing: Supervision/safety;Sit to/from stand   Toilet Transfer: Supervision/safety;Ambulation;Regular Toilet   Toileting- Clothing Manipulation and Hygiene: Supervision/safety;Sitting/lateral lean       Functional mobility during ADLs: Supervision/safety;Rolling walker (2 wheels) General ADL Comments: supervision for safety, no LOB. Pt with mild incoordination of LUE     Vision Baseline Vision/History: 4 Cataracts Ability to See in Adequate Light: 0 Adequate Vision Assessment?: Yes Eye Alignment: Within Functional Limits Ocular Range of Motion: Within Functional Limits Alignment/Gaze Preference: Within Defined Limits Tracking/Visual Pursuits: Decreased smoothness of horizontal tracking Saccades: Within functional limits Convergence: Within functional limits Visual Fields: No apparent deficits Additional Comments: pt states she has cataracts and has blurry vision at baseline     Perception     Praxis      Pertinent Vitals/Pain Pain Assessment: No/denies pain     Hand Dominance Right   Extremity/Trunk Assessment Upper Extremity Assessment Upper Extremity  Assessment: LUE deficits/detail LUE Deficits / Details:  globally 4/5 MMT, ROM is WFL, grip strength is slightly diminished. slowed finger to nose coordination, poor thumb to finger coordination and poor bimanual forearm flipping LUE Sensation: WNL LUE Coordination: decreased fine motor   Lower Extremity Assessment Lower Extremity Assessment: Defer to PT evaluation   Cervical / Trunk Assessment Cervical / Trunk Assessment: Normal   Communication Communication Communication: Prefers language other than English   Cognition Arousal/Alertness: Awake/alert Behavior During Therapy: WFL for tasks assessed/performed Overall Cognitive Status: Within Functional Limits for tasks assessed                                 General Comments: pt expressed concern with OP follow up since she does not drive ans her daughter works     Solicitor Comments  VSS on RA, daughter present and supportive throughout session. Interpreter utilized.    Exercises     Shoulder Instructions      Home Living Family/patient expects to be discharged to:: Private residence Living Arrangements: Children Available Help at Discharge: Family;Available 24 hours/day Type of Home: House Home Access: Stairs to enter [employer] of Steps: 3 Entrance Stairs-Rails: Left Home Layout: One level     Bathroom Shower/Tub: Teacher, early years/pre: Standard Bathroom Accessibility: No   Home Equipment: Shower seat;Grab bars - tub/shower;BSC/3in1   Additional Comments: daughter works during the day, neice can assist when daughter is not home  Lives With: Family;Daughter    Prior Functioning/Environment Prior Level of Function : History of Falls (last six months);Independent/Modified Independent                        OT Problem List: Decreased strength;Decreased coordination;Impaired UE functional use      OT Treatment/Interventions: Self-care/ADL training;Therapeutic exercise;DME and/or AE instruction;Therapeutic  activities;Patient/family education;Balance training;Neuromuscular education    OT Goals(Current goals can be found in the care plan section) Acute Rehab OT Goals Patient Stated Goal: home today OT Goal Formulation: With patient Time For Goal Achievement: 11/24/21 Potential to Achieve Goals: Fair ADL Goals Pt Will Perform Grooming: with modified independence;standing Pt Will Perform Lower Body Bathing: Independently;sit to/from stand Pt Will Perform Lower Body Dressing: Independently;sit to/from stand Pt Will Transfer to Toilet: ambulating;regular height toilet;Independently Pt Will Perform Toileting - Clothing Manipulation and hygiene: Independently;sitting/lateral leans Pt/caregiver will Perform Home Exercise Program: Increased ROM;Increased strength;Left upper extremity;With written HEP provided  OT Frequency: Min 2X/week    AM-PAC OT "6 Clicks" Daily Activity     Outcome Measure Help from another person eating meals?: None Help from another person taking care of personal grooming?: A Little Help from another person toileting, which includes using toliet, bedpan, or urinal?: A Little Help from another person bathing (including washing, rinsing, drying)?: A Little Help from another person to put on and taking off regular upper body clothing?: None Help from another person to put on and taking off regular lower body clothing?: A Little 6 Click Score: 20   End of Session Equipment Utilized During Treatment: Gait belt Nurse Communication: Mobility status  Activity Tolerance: Patient tolerated treatment well Patient left: in bed;with call bell/phone within reach;with family/visitor present  OT Visit Diagnosis: Hemiplegia and hemiparesis Hemiplegia - Right/Left: Left Hemiplegia - dominant/non-dominant: Non-Dominant Hemiplegia - caused by: Cerebral infarction                Time: [id number] OT  Time Calculation (min): 15 min Charges:  OT General Charges $OT Visit: 1 Visit OT  Evaluation $OT Eval Moderate Complexity: 1 Mod  Solae Norling A Joannah Gitlin 11/10/2021, 11:35 AM

## 2021-11-10 NOTE — TOC Initial Note (Signed)
Transition of Care [facility]) - Initial/Assessment Note    Patient Details  Name: Samantha [name] MRN: [id number] Date of Birth: [birth year]  [facility]) CM/SW Contact:    Kermit Balo, RN Phone Number: 11/10/2021, 11:55 AM  Clinical Narrative:                 Patient is from home with her daughter. Daughter is at the bedside and provided interpreter services. Pt has no current DME at home. CM provided choice for outpatient therapy and daughter selected [facility]. Orders in Epic and information on the AVS. Pt without insurance but is active with [facility] and their pharmacy. Daughter says they have had no issues with home medications. Daughter denies transportation issues.  TOC following.  Expected Discharge Plan: OP Rehab Barriers to Discharge: Inadequate or no insurance, Continued Medical Work up   Patient Goals and CMS Choice   CMS Medicare.gov Compare Post Acute Care list provided to:: Patient Represenative (must comment) Choice offered to / list presented to : Adult Children (daughter)  Expected Discharge Plan and Services Expected Discharge Plan: OP Rehab   Discharge Planning Services: CM Consult   Living arrangements for the past 2 months: Single Family Home                                      Prior Living Arrangements/Services Living arrangements for the past 2 months: Single Family Home Lives with:: Adult Children Patient language and need for interpreter reviewed:: Yes Do you feel safe going back to the place where you live?: Yes        Care giver support system in place?: Yes (comment)   Criminal Activity/Legal Involvement Pertinent to Current Situation/Hospitalization: No - Comment as needed  Activities of Daily Living      Permission Sought/Granted                  Emotional Assessment Appearance:: Appears stated age     Orientation: : Oriented to Self, Oriented to Place, Oriented to  Time, Oriented to Situation   Psych  Involvement: No (comment)  Admission diagnosis:  Numbness [R20.0] CVA (cerebral vascular accident) [facility]) [I63.9] Patient Active Problem List   Diagnosis Date Noted   CVA (cerebral vascular accident) (HCC) 11/08/2021   Anxiety 04/27/2017   Non compliance w medication regimen 12/23/2016   History of ulceration 03/30/2016   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   Type 2 diabetes mellitus with complication, with long-term current use of insulin (HCC) 02/06/2016   Healthcare maintenance 02/06/2016   Critical lower limb ischemia (HCC) 05/16/2015   Hyperlipidemia 04/12/2015   Depression 04/04/2015   PAD (peripheral artery disease) (HCC) 04/04/2015   Diabetic ulcer of left foot associated with type 2 diabetes mellitus (HCC) 02/25/2015   Diabetic neuropathy, painful (HCC) 02/25/2015   Major depressive disorder, recurrent episode, moderate (HCC) 02/25/2015   Essential hypertension 01/13/2015   Protein-calorie malnutrition, severe (HCC) 01/11/2015   Diabetic toe ulcer (HCC) 01/10/2015   Uncontrolled diabetes mellitus 01/10/2015   PCP:  Hoy Register, MD Pharmacy:   [facility] [address] [place] [address] Phone: [phone] Fax: [phone]  [facility] [place], [place] - [address] [place] [address] Phone: [phone] Fax: [phone]     Social Determinants of Health (SDOH) Interventions  Readmission Risk Interventions No flowsheet data found.   

## 2021-11-10 NOTE — Progress Notes (Signed)
Inpatient Diabetes Program Recommendations  AACE/ADA: New Consensus Statement on Inpatient Glycemic Control (2015)  Target Ranges:  Prepandial:   less than 140 mg/dL      Peak postprandial:   less than 180 mg/dL (1-2 hours)      Critically ill patients:  140 - 180 mg/dL   Lab Results  Component Value Date   GLUCAP 270 (H) 11/10/2021   HGBA1C 8.9 (H) 11/09/2021    Review of Glycemic Control Results for Samantha [name], Samantha [name] (MRN [id number]) as of 11/10/2021 14:16  Ref. Range 11/09/2021 11:47 11/09/2021 16:22 11/09/2021 21:32 11/10/2021 06:53 11/10/2021 11:47  Glucose-Capillary Latest Ref Range: 70 - 99 mg/dL 650 (H) 354 (H) 656 (H) 226 (H) 270 (H)   Diabetes history: DM 2 Outpatient Diabetes medications:  Humulin 70/30 25 units bid Metformin 1000 mg bid Amaryl 4 mg daily (patient not taking) Current orders for Inpatient glycemic control:  Novolog moderate tid with meals and HS  Inpatient Diabetes Program Recommendations:    Referral received.  A1C consistent from 2019.  Patient is on insulin at home.  May need adjustment in insulins at home to improve glycemic control.  Recommend close f/u with PCP and possible adjustment of medications for DM.   Thanks,  Beryl Meager, RN, BC-ADM Inpatient Diabetes Coordinator Pager [phone]  (8a-5p)

## 2021-11-10 NOTE — TOC Transition Note (Signed)
Transition of Care [facility]) - CM/SW Discharge Note   Patient Details  Name: Samantha [name] MRN: [id number] Date of Birth: 1951-09-11  [facility] of Care [facility]) CM/SW Contact:  Kermit Balo, RN Phone Number: 11/10/2021, 12:48 PM   Clinical Narrative:    Patient is discharging home with daughter. Outpatient therapy information on the AVS. Daughter to provide transport home.  D/c meds sent to [facility] pharmacy.    Final next level of care: OP Rehab Barriers to Discharge: Inadequate or no insurance, Barriers Unresolved (comment)   Patient Goals and CMS Choice   CMS Medicare.gov Compare Post Acute Care list provided to:: Patient Represenative (must comment) Choice offered to / list presented to : Adult Children (daughter)  Discharge Placement                       Discharge Plan and Services   Discharge Planning Services: CM Consult                                 Social Determinants of Health (SDOH) Interventions     Readmission Risk Interventions No flowsheet data found.

## 2021-11-10 NOTE — Evaluation (Signed)
Physical Therapy Evaluation Patient Details Name: Samantha [name] MRN: [id number] DOB: 07-Oct-1951 Today's Date: 11/10/2021  History of Present Illness  Pt is a 70 y/o female admitted for episode of vertigo, left-sided numbness and discoordination.  CT showed left cerebellum hypodensity.  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 Past medical history of hypertension, hyperlipidemia, PAD, diabetes.   Clinical Impression  Pt admitted with above diagnosis. Pt currently with functional limitations due to the deficits listed below (see PT Problem List). At the time of PT eval pt was able to perform transfers and ambulation with gross min guard assist to supervision for safety and no AD. Pt scored 18/24 on the DGI indicating she is at a higher risk for falls at this time. Pt seems agreeable to outpatient PT for higher level balance training for hopeful return to PLOF. Acutely, pt will benefit from skilled PT to increase their independence and safety with mobility to allow discharge to the venue listed below.          Recommendations for follow up therapy are one component of a multi-disciplinary discharge planning process, led by the attending physician.  Recommendations may be updated based on patient status, additional functional criteria and insurance authorization.  Follow Up Recommendations Outpatient PT    Assistance Recommended at Discharge PRN  Functional Status Assessment Patient has had a recent decline in their functional status and demonstrates the ability to make significant improvements in function in a reasonable and predictable amount of time.  Equipment Recommendations  None recommended by PT    Recommendations for Other Services       Precautions / Restrictions Precautions Precautions: Fall Restrictions Weight Bearing Restrictions: No      Mobility  Bed  Mobility Overal bed mobility: Modified Independent             General bed mobility comments: Pt was received sitting up EOB when PT arrived.    Transfers Overall transfer level: Needs assistance Equipment used: None Transfers: Sit to/from Stand Sit to Stand: Supervision           General transfer comment: Light supervision for safety as pt powered up to full stand. No unsteadiness or LOB noted.    Ambulation/Gait Ambulation/Gait assistance: Supervision;Min guard Gait Distance (Feet): 300 Feet Assistive device: None Gait Pattern/deviations: Step-through pattern;Decreased stride length;Drifts right/left Gait velocity: Decreased Gait velocity interpretation: 1.31 - 2.62 ft/sec, indicative of limited community ambulator   General Gait Details: Min guard assist progressing to supervision for safety by end of gait training. Intermittent hands-on guarding provided during DGI.  Stairs Stairs: Yes Stairs assistance: Min guard Stair Management: One rail Left;Alternating pattern;Forwards Number of Stairs: 5 General stair comments: Practice stairs in the rehab gym. No assist required, however pt appears mildly uncoordinated with descent and lower to next step down was not smooth. Min guard assist provided for safety however no physical assist required.  Wheelchair Mobility    Modified Rankin (Stroke Patients Only) Modified Rankin (Stroke Patients Only) Pre-Morbid Rankin Score: No symptoms Modified Rankin: Moderately severe disability     Balance Overall balance assessment: Needs assistance Sitting-balance support: Feet supported Sitting balance-Leahy Scale: Good     Standing balance support: No upper extremity supported;During functional activity Standing balance-Leahy Scale: Fair                   Standardized Balance Assessment Standardized Balance Assessment : Dynamic Gait Index  Dynamic Gait Index Level Surface: Normal Change in Gait Speed: Mild  Impairment Gait with Horizontal Head Turns: Mild Impairment Gait with Vertical Head Turns: Mild Impairment Gait and Pivot Turn: Normal Step Over Obstacle: Mild Impairment Step Around Obstacles: Mild Impairment Steps: Mild Impairment Total Score: 18       Pertinent Vitals/Pain Pain Assessment: No/denies pain    Home Living Family/patient expects to be discharged to:: Private residence Living Arrangements: Children Available Help at Discharge: Family;Available 24 hours/day Type of Home: House Home Access: Stairs to enter Entrance Stairs-Rails: Left Entrance Stairs-Number of Steps: 3   Home Layout: One level Home Equipment: Shower seat;Grab bars - tub/shower;BSC/3in1 Additional Comments: daughter works during the day, neice can assist when daughter is not home    Prior Function Prior Level of Function : History of Falls (last six months);Independent/Modified Independent (chair that she was sitting in broke and she fell backwards. Unclear if there was another fall.)                     Hand Dominance   Dominant Hand: Right    Extremity/Trunk Assessment   Upper Extremity Assessment Upper Extremity Assessment: Defer to OT evaluation LUE Deficits / Details: globally 4/5 MMT, ROM is WFL, grip strength is slightly diminished. slowed finger to nose coordination, poor thumb to finger coordination and poor bimanual forearm flipping LUE Sensation: WNL LUE Coordination: decreased fine motor    Lower Extremity Assessment Lower Extremity Assessment: LLE deficits/detail LLE Deficits / Details: Mild weakness on the L in quads/hamstrings/hip flexors; grossly 4/5 LLE Sensation: WNL    Cervical / Trunk Assessment Cervical / Trunk Assessment: Normal  Communication   Communication: Prefers language other than English  Cognition Arousal/Alertness: Awake/alert Behavior During Therapy: WFL for tasks assessed/performed Overall Cognitive Status: Within Functional Limits for tasks  assessed                                 General Comments: pt expressed concern with OP follow up since she does not drive and her daughter works        Solicitor Comments General comments (skin integrity, edema, etc.): VSS on RA, daughter present and supportive throughout session. Interpreter utilized.    Exercises     Assessment/Plan    PT Assessment Patient needs continued PT services  PT Problem List Decreased strength;Decreased activity tolerance;Decreased balance;Decreased mobility;Decreased coordination;Decreased knowledge of use of DME;Decreased safety awareness;Decreased knowledge of precautions       PT Treatment Interventions DME instruction;Gait training;Stair training;Functional mobility training;Therapeutic activities;Therapeutic exercise;Neuromuscular re-education;Patient/family education    PT Goals (Current goals can be found in the Care Plan section)  Acute Rehab PT Goals Patient Stated Goal: Home today - more time to recover PT Goal Formulation: With patient/family Time For Goal Achievement: 11/17/21 Potential to Achieve Goals: Good Additional Goals Additional Goal #1: Pt will score 19 or above on the DGI to indicate a decreased risk for falls.    Frequency Min 3X/week   Barriers to discharge        Co-evaluation               AM-PAC PT "6 Clicks" Mobility  Outcome Measure Help needed turning from your back to your side while in a flat bed without using bedrails?: None Help needed moving from lying on your back to sitting on the side of a flat bed without using bedrails?: None Help needed moving to  and from a bed to a chair (including a wheelchair)?: A Little Help needed standing up from a chair using your arms (e.g., wheelchair or bedside chair)?: A Little Help needed to walk in hospital room?: A Little Help needed climbing 3-5 steps with a railing? : A Little 6 Click Score: 20    End of Session Equipment Utilized During  Treatment: Gait belt Activity Tolerance: Patient tolerated treatment well Patient left:  (Sitting EOB awaiting OT) Nurse Communication: Mobility status PT Visit Diagnosis: Unsteadiness on feet (R26.81);Other symptoms and signs involving the nervous system (R29.898)    Time: [id number] PT Time Calculation (min) (ACUTE ONLY): 31 min   Charges:   PT Evaluation $PT Eval Low Complexity: 1 Low PT Treatments $Gait Training: 8-22 mins        Rolinda Roan, PT, DPT Acute Rehabilitation Services Pager: [phone] Office: [phone]   Thelma Comp 11/10/2021, 1:38 PM

## 2021-11-10 NOTE — Plan of Care (Signed)
  Problem: Health Behavior/Discharge Planning: Goal: Ability to manage health-related needs will improve Outcome: Adequate for Discharge   Problem: Clinical Measurements: Goal: Ability to maintain clinical measurements within normal limits will improve Outcome: Adequate for Discharge Goal: Will remain free from infection Outcome: Adequate for Discharge Goal: Diagnostic test results will improve Outcome: Adequate for Discharge Goal: Respiratory complications will improve Outcome: Adequate for Discharge   Problem: Coping: Goal: Level of anxiety will decrease Outcome: Adequate for Discharge   Problem: Safety: Goal: Ability to remain free from injury will improve Outcome: Adequate for Discharge   Problem: Ischemic Stroke/TIA Tissue Perfusion: Goal: Complications of ischemic stroke/TIA will be minimized Outcome: Adequate for Discharge   Problem: Health Behavior/Discharge Planning: Goal: Ability to manage health-related needs will improve Outcome: Adequate for Discharge

## 2021-11-10 NOTE — Progress Notes (Signed)
Samantha [name],  D/C'd Home per MD order.  Discussed with the patient and all questions fully answered.  VSS, Skin clean, dry and intact without evidence of skin break down, no evidence of skin tears noted. IV catheter discontinued intact. Site without signs and symptoms of complications. Dressing and pressure applied.  An After Visit Summary was printed and given to the patient. Patient received prescription.  D/c education completed with patient and family including follow up instructions, medication list, d/c activities limitations if indicated, with other d/c instructions as indicated by MD - patient able to verbalize understanding, all questions fully answered.   Patient instructed to return to ED, call 911, or call MD for any changes in condition.   Patient escorted via WC, and D/C home via private auto at 1412.  Samantha [name] 11/10/2021 2:01 PM

## 2021-11-11 ENCOUNTER — Telehealth: Payer: Self-pay

## 2021-11-11 NOTE — Telephone Encounter (Signed)
From the discharge call:  She said she is doing better and she wanted to know what she can do to prevent another stroke. Discussed importance of taking all of her medications as ordered, controlling blood sugars and BP as well as proper diet.  She said she will discuss further with Dr Alvis Lemmings at her upcoming appointment.    She said she has all medications and did not have any questions about her med regime. She said she has a glucometer checks her own blood sugars and gives her own insulin   Has been scheduled for outpatient PT/OT at [facility]    Scheduled to see Dr Alvis Lemmings  on 11/19/2021.  Has not scheduled appointment with Neurology yet

## 2021-11-11 NOTE — Telephone Encounter (Signed)
Transition Care Management Follow-up Telephone Call  Call completed with assistance of Spanish Interpreter # [id number]/[employer] Date of discharge and from where: 11/10/2021, [facility]  How have you been since you were released from the hospital? She said she is doing better.  Any questions or concerns? Yes  - she wanted to know what she can do to prevent another stroke. Discussed importance of taking all of her medications as ordered, controlling blood sugars and BP as well as proper diet.  She said she will discuss further with Dr Alvis Lemmings at her upcoming appointment.   Items Reviewed: Did the pt receive and understand the discharge instructions provided? Yes  Medications obtained and verified? Yes - She said she has all medications and did not have any questions about her med regime. She said she has a glucometer checks her own blood sugars and gives her own insulin  Other? No  Any new allergies since your discharge? No  Dietary orders reviewed? Yes Do you have support at home? Yes   Home Care and Equipment/Supplies: Were home health services ordered? no If so, what is the name of the agency? N/a  Has the agency set up a time to come to the patient's home? not applicable Were any new equipment or medical supplies ordered?  No What is the name of the medical supply agency? N/a Were you able to get the supplies/equipment? not applicable Do you have any questions related to the use of the equipment or supplies? No  Has been scheduled for outpatient PT/OT at [facility]   Functional Questionnaire: (I = Independent and D = Dependent) ADLs: independent   Follow up appointments reviewed:  PCP Hospital f/u appt confirmed? Yes  Scheduled to see Dr Alvis Lemmings  on 11/19/2021. Specialist Hospital f/u appt confirmed?  Has not scheduled appointment with Neurology yet   Are transportation arrangements needed? No  If their condition worsens, is the pt aware to call PCP or go to the  Emergency Dept.? Yes Was the patient provided with contact information for the PCP's office or ED? Yes Was to pt encouraged to call back with questions or concerns? Yes

## 2021-11-14 ENCOUNTER — Telehealth: Payer: Self-pay | Admitting: Family Medicine

## 2021-11-14 NOTE — Telephone Encounter (Signed)
Copied from CRM [id number]. Topic: Appointment Scheduling - Scheduling Inquiry for Clinic >> Nov 11, 2021  9:45 AM Marylen Ponto wrote: Reason for CRM: Pt would like to schedule an appt for financial counseling.

## 2021-11-14 NOTE — Telephone Encounter (Signed)
I return Pt call, LVM to call me back to schedule a financial appt

## 2021-11-17 ENCOUNTER — Ambulatory Visit: Payer: Self-pay | Admitting: Physical Therapy

## 2021-11-17 ENCOUNTER — Encounter: Payer: Self-pay | Admitting: Occupational Therapy

## 2021-11-19 ENCOUNTER — Other Ambulatory Visit: Payer: Self-pay

## 2021-11-19 ENCOUNTER — Ambulatory Visit: Payer: Self-pay | Attending: Family Medicine | Admitting: Family Medicine

## 2021-11-19 VITALS — BP 176/80 | HR 77 | Ht 63.0 in | Wt 150.0 lb

## 2021-11-19 DIAGNOSIS — E1165 Type 2 diabetes mellitus with hyperglycemia: Secondary | ICD-10-CM

## 2021-11-19 DIAGNOSIS — E118 Type 2 diabetes mellitus with unspecified complications: Secondary | ICD-10-CM

## 2021-11-19 DIAGNOSIS — N1832 Chronic kidney disease, stage 3b: Secondary | ICD-10-CM

## 2021-11-19 DIAGNOSIS — I10 Essential (primary) hypertension: Secondary | ICD-10-CM

## 2021-11-19 DIAGNOSIS — Z8673 Personal history of transient ischemic attack (TIA), and cerebral infarction without residual deficits: Secondary | ICD-10-CM

## 2021-11-19 DIAGNOSIS — I6350 Cerebral infarction due to unspecified occlusion or stenosis of unspecified cerebral artery: Secondary | ICD-10-CM

## 2021-11-19 DIAGNOSIS — I7121 Aneurysm of the ascending aorta, without rupture: Secondary | ICD-10-CM

## 2021-11-19 DIAGNOSIS — E1122 Type 2 diabetes mellitus with diabetic chronic kidney disease: Secondary | ICD-10-CM

## 2021-11-19 DIAGNOSIS — Z794 Long term (current) use of insulin: Secondary | ICD-10-CM

## 2021-11-19 DIAGNOSIS — H538 Other visual disturbances: Secondary | ICD-10-CM

## 2021-11-19 LAB — GLUCOSE, POCT (MANUAL RESULT ENTRY): POC Glucose: 256 mg/dl — AB (ref 70–99)

## 2021-11-19 MED ORDER — AMLODIPINE BESYLATE 5 MG PO TABS
5.0000 mg | ORAL_TABLET | Freq: Every day | ORAL | 3 refills | Status: DC
  Filled 2021-11-19: qty 30, 30d supply, fill #0

## 2021-11-19 MED ORDER — HUMULIN 70/30 KWIKPEN (70-30) 100 UNIT/ML ~~LOC~~ SUPN
PEN_INJECTOR | SUBCUTANEOUS | 6 refills | Status: DC
  Filled 2021-11-19: qty 9, 16d supply, fill #0
  Filled 2021-11-26: qty 15, 27d supply, fill #1
  Filled 2022-01-02 – 2022-01-05 (×4): qty 15, 27d supply, fill #0
  Filled 2022-02-02: qty 15, 27d supply, fill #1
  Filled 2022-02-27 – 2022-03-02 (×2): qty 15, 27d supply, fill #2

## 2021-11-19 NOTE — Patient Instructions (Signed)
Cmo controlar su hipertensin Managing Your Hypertension La hipertensin, tambin conocida como presin arterial alta, se produce cuando la sangre ejerce presin contra las paredes de las arterias con demasiada fuerza. Las arterias son los vasos sanguneos que transportan la sangre desde el corazn hacia todas las partes del cuerpo. La hipertensin hace que el corazn haga ms esfuerzo para bombear la sangre y puede provocar que las arterias se estrechen o endurezcan. Qu significan las lecturas de la presin arterial La presin arterial deseada puede variar en funcin de las enfermedades, la edad y otros factores personales. Una lectura de la presin arterial consta de un nmero ms alto sobre un nmero ms bajo. En condiciones ideales, la presin arterial debe estar por debajo de 120/80. Es importante que conozca lo siguiente: El primer nmero, o nmero superior, es la presin sistlica. Es la medida de la presin de las arterias cuando el corazn late. El segundo nmero, o nmero inferior, es la presin diastlica. Es la medida de la presin en las arterias cuando el corazn se relaja. La presin arterial se clasifica en cuatro etapas. Sobre la base de la lectura de su presin arterial, el mdico puede usar las siguientes etapas para determinar si necesita tratamiento y de qu tipo. La presin sistlica y la presin diastlica se miden en una unidad llamada mm Hg. Normales Presin sistlica: por debajo de 120. Presin diastlica: por debajo de 80. Elevada Presin sistlica: 120-129. Presin diastlica: por debajo de 80. Etapa 1 de hipertensin Presin sistlica: 130-139. Presin diastlica: 80-89. Etapa 2 de hipertensin Presin sistlica: 140 o ms. Presin diastlica: 90 o ms. Cmo puede afectarme esta enfermedad? Controlar la hipertensin es una responsabilidad importante. Con el transcurso del tiempo, la hipertensin puede daar las arterias y disminuir el flujo de sangre hacia partes  importantes del cuerpo que incluyen el cerebro, el corazn y los riones. Tener hipertensin no tratada o no controlada puede causar: Infarto de miocardio. Accidente cerebrovascular. Debilitamiento de los vasos sanguneos (aneurisma). Insuficiencia cardaca. Dao renal. Dao ocular. Sndrome metablico. Problemas de memoria y concentracin. Demencia vascular. Qu medidas puedo tomar para controlar esta afeccin? La hipertensin se puede controlar haciendo cambios en el estilo de vida y, posiblemente, tomando medicamentos. Su mdico le ayudar a crear un plan para bajar la presin arterial al rango normal. Nutricin  Siga una dieta con alto contenido de fibras y potasio, y con bajo contenido de sal (sodio), azcar agregada y grasas. Un ejemplo de plan alimenticio es la dieta sobre mtodos alimenticios para detener la hipertensin llamada DASH (Dietary Approaches to Stop Hypertension). Para alimentarse de esta manera: Coma mucha fruta y verdura fresca. Trate de que la mitad del plato de cada comida sea de frutas y verduras. Coma cereales integrales, como pasta integral, arroz integral o pan integral. Llene aproximadamente un cuarto del plato con cereales integrales. Consuma productos lcteos descremados. Evite la ingesta de cortes de carne grasa, carne procesada o curada, y carne de ave con piel. Llene aproximadamente un cuarto del plato con protenas magras, como pescado, pollo sin piel, frijoles, huevos o tofu. Evite ingerir alimentos prehechos y procesados. En general, estos tienen mayor cantidad de sodio, azcar agregada y grasa. Reduzca su ingesta diaria de sodio. La mayora de las personas que tienen hipertensin deben comer menos de 1500 mg de sodio por da. Estilo de vida  Trabaje con su mdico para mantener un peso saludable o perder peso. Pregntele cul es el peso recomendado para usted. Realice al menos 30 minutos de ejercicio que   haga que se acelere su corazn (ejercicio aerbico)  la mayora de los das de la semana. Estas actividades pueden incluir caminar, nadar o andar en bicicleta. Incluya ejercicios para fortalecer sus msculos (ejercicios de resistencia), como levantamiento de pesas, como parte de su rutina semanal de ejercicios. Intente realizar 30 minutos de este tipo de ejercicios al menos tres das a la semana. No consuma ningn producto que contenga nicotina o tabaco, como cigarrillos, cigarrillos electrnicos y tabaco de mascar. Si necesita ayuda para dejar de fumar, consulte al mdico. Controle las enfermedades a largo plazo (crnicas), como el colesterol alto o la diabetes. Identifique sus causas de estrs y encuentre maneras de controlar el estrs. Esto puede incluir meditacin, respiracin profunda o hacerse tiempo para realizar actividades divertidas. Consumo de alcohol No beba alcohol si: Su mdico le indica no hacerlo. Est embarazada, puede estar embarazada o est tratando de quedar embarazada. Si bebe alcohol: Limite la cantidad que bebe: De 0 a 1 medida por da para las mujeres. De 0 a 2 medidas por da para los hombres. Est atento a la cantidad de alcohol que hay en las bebidas que toma. En los [place], una medida equivale a una botella de cerveza de 12 oz (355 ml), un vaso de vino de 5 oz (148 ml) o un vaso de una bebida alcohlica de alta graduacin de 1 oz (44 ml). Medicamentos El mdico puede recetarle medicamentos si los cambios en el estilo de vida no son suficientes para lograr controlar la presin arterial y si: Su presin arterial sistlica es de 130 o ms. Su presin arterial diastlica es de 80 o ms. Use los medicamentos solamente como se lo haya indicado el mdico. Siga cuidadosamente las indicaciones. Los medicamentos para la presin arterial deben tomarse como se lo haya indicado el mdico. Los medicamentos pierden eficacia al omitir las dosis. El hecho de omitir las dosis tambin aumenta el riesgo de otros  problemas. Monitoreo Antes de controlarse la presin arterial: No fume, no consuma bebidas con cafena ni haga ejercicio dentro de los 30 minutos antes de tomar la medicin. Vaya al bao y vace la vejiga (orine). Permanezca sentado tranquilamente durante al menos 5 minutos antes de tomar las mediciones. Contrlese la presin arterial en su casa segn las indicaciones del mdico. Para hacer esto: Sintese con la espalda recta y con apoyo. Coloque los pies planos en el piso. No se cruce de piernas. Apoye el brazo sobre una superficie plana, como una mesa. Asegrese de que la parte superior del brazo est al nivel del corazn. Cada vez que tome una medicin, tome dos o tres lecturas con un minuto de separacin y anote los resultados. Posiblemente tambin sea necesario que el mdico le controle la presin arterial de manera regular. Informacin general Hable con su mdico acerca de la dieta, hbitos de ejercicio y otros factores del estilo de vida que pueden contribuir a la hipertensin. Revise con su mdico todos los medicamentos que toma ya que puede haber efectos secundarios o interacciones. Concurra a todas las visitas tal como se lo haya indicado el mdico. El mdico puede ayudarle a crear y ajustar su plan para controlar la presin arterial alta. Dnde buscar ms informacin National Heart, Lung, and Blood Institute (Instituto Nacional del Corazn, los Pulmones y la Sangre): www.nhlbi.nih.gov American Heart Association (Asociacin Estadounidense del Corazn): www.heart.org Comunquese con un mdico si: Piensa que tiene una reaccin alrgica a los medicamentos que ha tomado. Tiene dolores de cabeza frecuentes (recurrentes). Siente mareos. Tiene hinchazn   en los tobillos. Tiene problemas de visin. Solicite ayuda de inmediato si: Siente un dolor de cabeza intenso o confusin. Siente debilidad inusual, adormecimiento o que se desmayar. Siente un dolor intenso en el pecho o el  abdomen. Vomita repetidas veces. Tiene dificultad para respirar. Estos sntomas pueden representar un problema grave que constituye una emergencia. No espere a ver si los sntomas desaparecen. Solicite atencin mdica de inmediato. Comunquese con el servicio de emergencias de su localidad (911 en los [place]). No conduzca por sus propios medios hasta el hospital. Resumen La hipertensin se produce cuando la sangre bombea en las arterias con mucha fuerza. Si esta afeccin no se controla, podra correr riesgo de tener complicaciones graves. La presin arterial deseada puede variar en funcin de las enfermedades, la edad y otros factores personales. Para la mayora de las personas, una presin arterial normal es menor que 120/80. La hipertensin se puede controlar mediante cambios en el estilo de vida, tomando medicamentos, o ambas cosas. Los cambios en el estilo de vida para ayudar a controlar la hipertensin incluyen prdida de peso, seguir una dieta saludable, con bajo contenido de sodio, hacer ms ejercicio, dejar de fumar y limitar el consumo de alcohol. Esta informacin no tiene como fin reemplazar el consejo del mdico. Asegrese de hacerle al mdico cualquier pregunta que tenga. Document Revised: 01/19/2020 Document Reviewed: 01/19/2020 Elsevier Patient Education  2022 [employer] Inc.  

## 2021-11-19 NOTE — Progress Notes (Signed)
Had a stroke on Nov, 12 2022

## 2021-11-19 NOTE — Progress Notes (Signed)
Subjective:  Patient ID: Samantha [name], female    DOB: Apr 30, 1951  Age: 70 y.o. MRN: [id number]  CC: Diabetes   HPI Samantha [name] is a 70 y.o. year old female with a history of hypertension, type 2 diabetes mellitus (A1c 8.9), hyperlipidemia, depression, peripheral vascular disease who is seen for a transition of care visit today after hospitalization at [facility] from 11/08/2021 through 11/10/2021 for an acute stroke. She had presented with dizziness and left-sided numbness and MRI revealed acute left cerebellar infarct, severe left M1 MCA stenosis, concern for dissection in the vertebral artery.  CT angio head and neck revealed: IMPRESSION: 1. Stable from MRI/MRA yesterday. 2. Intermittent severe stenosis of the left vertebral artery with minimal intradural contribution, dissection is again a strong consideration given the degree of irregularity. 65% stenosis of the proximal left subclavian artery from atherosclerosis and vessel kink. 3. Severe left M1 segment stenosis. 4. At least moderate atheromatous narrowings of the proximal basilar. Moderate bilateral PCA narrowings.  MRI/MRA brain revealed: IMPRESSION: MRI:   1. Acute left cerebellar infarct.  Mild edema without mass effect. 2. Mild-to-moderate chronic microvascular ischemic disease   MRA head:   1. Severe stenosis versus short-segment occlusion of the distal left M1 MCA with preserved flow related signal and proximal M2 branches. 2. 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 3. Moderate left P2 PCA stenosis. 4. 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   MRA neck:   1. Multifocal irregularity of the left vertebral artery in the neck with multifocal severe stenosis. Given the relative paucity of atherosclerosis/narrowing in the neck, dissection is a differential consideration. A CTA neck could further evaluate if  clinically indicated. 2. Otherwise, no significant (greater than 50%) stenosis in the neck.   Echo revealed EF of 60 to 65%, no regional wall motion abnormalities, moderate LVH, small aortic root aneurysm measuring 3.1 cm, aortic dilatation noted.  Seen by neurology and aspirin and Plavix commenced for 3 months after which she will continue Plavix alone, Lipitor dose was increased.  Labs did reveal elevated creatinine of 1.79 and a GFR of 32.  She also underwent PT, OT.  Interval History: Left-sided weakness which she previously had at the time of admission has resolved.  She does have neurology follow-up in 1 month She complains of blurry vision and heaviness in her head. Her blurry vision has always been abnormal  due to her cataracts however she states she has addition visual blurring. Med list reveals she should be on Humulin 70/30, 40 units in the morning and 25 in the evening but she administers 25 units bid. She has not been checking her sugars as she ran out of test strips.   Blood pressure is still elevated and on further questioning I am able to gather that she takes carvedilol just once a day rather than twice a day but adheres to losartan. Accompanied by her daughter to today's visit. Past Medical History:  Diagnosis Date   Acute pyelonephritis    Bacteremia, escherichia coli 01/12/2015   Chest pain    Critical lower limb ischemia (HCC)    Hyperlipidemia    Hypertension    Left carotid bruit    PAD (peripheral artery disease) ([place])    Type II diabetes mellitus ([place])     Past Surgical History:  Procedure Laterality Date   ABDOMINAL ANGIOGRAM  05/16/2015   Procedure: Abdominal Angiogram;  Surgeon: Lorretta Harp, MD;  Location:  [facility];  Service: Cardiovascular;;   ABDOMINAL HYSTERECTOMY  ~ 2000   PERIPHERAL VASCULAR CATHETERIZATION Bilateral 05/16/2015   Procedure: Lower Extremity Angiography;  Surgeon: Lorretta Harp, MD; renal arteries widely patent,  L-SFA 75%, L-pop 95%, L-peroneal 90%; R-SFA 40%, R-pop 50%, R-prox peroneal 90%; directional atherectomy L-SFA, p-pop, drug-eluting balloon angioplasty reducing the stenoses to 0, small linear dissection was not flow limiting       PERIPHERAL VASCULAR CATHETERIZATION Left 05/16/2015   Procedure: Peripheral Vascular Atherectomy;  Surgeon: Lorretta Harp, MD;  Location: [facility];  Service: Cardiovascular;  Laterality: Left;  sfa    Family History  Problem Relation Age of Onset   Hypertension Mother     Allergies  Allergen Reactions   Perflutren Other (See Comments)    Pt with back pain as soon as administered.    Outpatient Medications Prior to Visit  Medication Sig Dispense Refill   aspirin 325 MG tablet Take 1 tablet (325 mg total) by mouth daily. For 3 months only 30 tablet 2   atorvastatin (LIPITOR) 80 MG tablet Take 1 tablet (80 mg total) by mouth daily. 30 tablet 3   Blood Glucose Monitoring Suppl (TRUE METRIX METER) DEVI 1 each by Does not apply route 3 (three) times daily before meals. 1 Device 0   carvedilol (COREG) 25 MG tablet Take 1 tablet (25 mg total) by mouth 2 (two) times daily with a meal. 180 tablet 1   clopidogrel (PLAVIX) 75 MG tablet Take 1 tablet (75 mg total) by mouth daily. 30 tablet 3   glimepiride (AMARYL) 4 MG tablet Take 1 tablet (4 mg total) by mouth daily with breakfast. 90 tablet 1   glucose blood test strip Use 3 times daily before meals 100 each 12   Insulin Pen Needle (TRUEPLUS PEN NEEDLES) 31G X 6 MM MISC Inject 1 each into the skin 2 (two) times daily. 100 each 12   Insulin Syringe-Needle U-100 (BD INSULIN SYRINGE ULTRAFINE) 31G X 15/64" 0.5 ML MISC Use subcutaneously twice daily 60 each 12   losartan (COZAAR) 100 MG tablet TAKE 1 TABLET BY MOUTH DAILY. (Patient taking differently: Take 100 mg by mouth daily.) 90 tablet 1   metFORMIN (GLUCOPHAGE) 1000 MG tablet Take 1 tablet (1,000 mg total) by mouth 2 (two) times daily with a meal. 180 tablet  1   TRUEPLUS LANCETS 28G MISC 1 each by Does not apply route 3 (three) times daily before meals. 100 each 12   insulin isophane & regular human KwikPen (HUMULIN 70/30 KWIKPEN) (70-30) 100 UNIT/ML KwikPen Inject subcutaneously twice daily 40 units in the morning and 25 units in the evening (Patient taking differently: Inject 25 Units into the skin 2 (two) times daily.) 90 mL 6   No facility-administered medications prior to visit.     ROS Review of Systems  Constitutional:  Negative for activity change, appetite change and fatigue.  HENT:  Negative for congestion, sinus pressure and sore throat.   Eyes:  Positive for visual disturbance.  Respiratory:  Negative for cough, chest tightness, shortness of breath and wheezing.   Cardiovascular:  Negative for chest pain and palpitations.  Gastrointestinal:  Negative for abdominal distention, abdominal pain and constipation.  Endocrine: Negative for polydipsia.  Genitourinary:  Negative for dysuria and frequency.  Musculoskeletal:  Negative for arthralgias and back pain.  Skin:  Negative for rash.  Neurological:  Negative for tremors, light-headedness and numbness.  Hematological:  Does not bruise/bleed easily.  Psychiatric/Behavioral:  Negative for agitation and behavioral problems.    Objective:  BP (!) 176/80   Pulse 77   Ht '5\' 3"'  (1.6 m)   Wt 150 lb (68 kg)   SpO2 99%   BMI 26.57 kg/m   BP/Weight 11/19/2021 11/10/2021 46/50/3546  Systolic BP 568 127 -  Diastolic BP 80 84 -  Wt. (Lbs) 150 - 161.16  BMI 26.57 - 27.66      Physical Exam Constitutional:      Appearance: She is well-developed.  Cardiovascular:     Rate and Rhythm: Normal rate.     Heart sounds: Normal heart sounds. No murmur heard. Pulmonary:     Effort: Pulmonary effort is normal.     Breath sounds: Normal breath sounds. No wheezing or rales.  Chest:     Chest wall: No tenderness.  Abdominal:     General: Bowel sounds are normal. There is no distension.      Palpations: Abdomen is soft. There is no mass.     Tenderness: There is no abdominal tenderness.  Musculoskeletal:        General: Normal range of motion.     Right lower leg: No edema.     Left lower leg: No edema.  Neurological:     Mental Status: She is alert and oriented to person, place, and time.  Psychiatric:        Mood and Affect: Mood normal.    CMP Latest Ref Rng & Units 11/10/2021 11/08/2021 11/08/2021  Glucose 70 - 99 mg/dL 199(H) 188(H) 261(H)  BUN 8 - 23 mg/dL 22 22 24(H)  Creatinine 0.44 - 1.00 mg/dL 1.72(H) 1.50(H) 1.57(H)  Sodium 135 - 145 mmol/L 136 141 133(L)  Potassium 3.5 - 5.1 mmol/L 4.0 3.5 4.3  Chloride 98 - 111 mmol/L 107 107 102  CO2 22 - 32 mmol/L 21(L) - 21(L)  Calcium 8.9 - 10.3 mg/dL 8.8(L) - 9.8  Total Protein 6.5 - 8.1 g/dL - - 7.5  Total Bilirubin 0.3 - 1.2 mg/dL - - 0.8  Alkaline Phos 38 - 126 U/L - - 82  AST 15 - 41 U/L - - 23  ALT 0 - 44 U/L - - 24    Lipid Panel     Component Value Date/Time   CHOL 134 11/09/2021 0257   CHOL 139 09/13/2018 1002   TRIG 168 (H) 11/09/2021 0257   HDL 33 (L) 11/09/2021 0257   HDL 45 09/13/2018 1002   CHOLHDL 4.1 11/09/2021 0257   VLDL 34 11/09/2021 0257   LDLCALC 67 11/09/2021 0257   LDLCALC 74 09/13/2018 1002    CBC    Component Value Date/Time   WBC 9.3 11/10/2021 0330   RBC 3.89 11/10/2021 0330   HGB 12.5 11/10/2021 0330   HCT 36.2 11/10/2021 0330   PLT 327 11/10/2021 0330   MCV 93.1 11/10/2021 0330   MCH 32.1 11/10/2021 0330   MCHC 34.5 11/10/2021 0330   RDW 12.0 11/10/2021 0330   LYMPHSABS 1.4 11/08/2021 0935   MONOABS 0.5 11/08/2021 0935   EOSABS 0.3 11/08/2021 0935   BASOSABS 0.1 11/08/2021 0935    Lab Results  Component Value Date   HGBA1C 8.9 (H) 11/09/2021    Vision Screening   Right eye Left eye Both eyes  Without correction '20/50 20/50 20/50 '  With correction       Assessment & Plan:  1. Type 2 diabetes mellitus with stage 3b chronic kidney disease, with  long-term current use of insulin ([place]) Uncontrolled  with A1c of 8.9; goal is less than 7.0 Increased Humulin 70/30 from 25 mg twice daily to 30 units in the morning and 25 units in the evening I would love to place her on a GLP-1 however she declines due to the fact that she has a decreased appetite already and would not want to worsen that We will send off labs to assess renal function Counseled on Diabetic diet, my plate method, 047 minutes of moderate intensity exercise/week Blood sugar logs with fasting goals of 80-120 mg/dl, random of less than 180 and in the event of sugars less than 60 mg/dl or greater than 400 mg/dl encouraged to notify the clinic. Advised on the need for annual eye exams, annual foot exams, Pneumonia vaccine. - POCT glucose (manual entry) - CMP14+EGFR  2. Aortic root aneurysm Echo revealed diameter of 3.1 cm We will monitor  3. Type 2 diabetes mellitus with hyperglycemia, with long-term current use of insulin (HCC) See #1 above - insulin isophane & regular human KwikPen (HUMULIN 70/30 KWIKPEN) (70-30) 100 UNIT/ML KwikPen; Inject subcutaneously twice daily 30 units in the morning and 25 units in the evening  Dispense: 90 mL; Refill: 6  4. Blurry vision, bilateral Could be secondary to elevated blood pressure Clonidine not available in the clinic to administer Advised to take second dose of carvedilol as soon as she gets home Visual acuity is 20/50 bilaterally Advised to see ophthalmologist for management of cataract  5. Accelerated hypertension Uncontrolled I have explained the correct instructions of her medications to the patient and her daughter Amlodipine added and she will follow-up with the Pharm.D. for reassessment Counseled on blood pressure goal of less than 130/80, low-sodium, DASH diet, medication compliance, 150 minutes of moderate intensity exercise per week. Discussed medication compliance, adverse effects. - amLODipine (NORVASC) 5 MG tablet;  Take 1 tablet (5 mg total) by mouth daily.  Dispense: 30 tablet; Refill: 3  6. History of stroke No residual deficits Counseled about aggressive risk factor modification to prevent future strokes including hypertension, hyperlipidemia, diabetes mellitus We have discussed compliance with medications and the fact that her daughter might need to be more hands-on with regards to assisting this patient with compliance and medication adherence   7.  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  Meds ordered this encounter  Medications   amLODipine (NORVASC) 5 MG tablet    Sig: Take 1 tablet (5 mg total) by mouth daily.    Dispense:  30 tablet    Refill:  3   insulin isophane & regular human KwikPen (HUMULIN 70/30 KWIKPEN) (70-30) 100 UNIT/ML KwikPen    Sig: Inject subcutaneously twice daily 30 units in the morning and 25 units in the evening    Dispense:  90 mL    Refill:  6     Follow-up: Return in about 2 weeks (around 12/03/2021) for Blood pressure follow-up with Lurena Joiner; 3 months with PCP.       Charlott Rakes, MD, FAAFP. [facility] and [place], [place]   11/19/2021, 4:30 PM

## 2021-11-20 LAB — CMP14+EGFR
ALT: 18 IU/L (ref 0–32)
AST: 17 IU/L (ref 0–40)
Albumin/Globulin Ratio: 1.6 (ref 1.2–2.2)
Albumin: 4.2 g/dL (ref 3.8–4.8)
Alkaline Phosphatase: 149 IU/L — ABNORMAL HIGH (ref 44–121)
BUN/Creatinine Ratio: 15 (ref 12–28)
BUN: 23 mg/dL (ref 8–27)
Bilirubin Total: 0.3 mg/dL (ref 0.0–1.2)
CO2: 21 mmol/L (ref 20–29)
Calcium: 9.5 mg/dL (ref 8.7–10.3)
Chloride: 103 mmol/L (ref 96–106)
Creatinine, Ser: 1.55 mg/dL — ABNORMAL HIGH (ref 0.57–1.00)
Globulin, Total: 2.7 g/dL (ref 1.5–4.5)
Glucose: 219 mg/dL — ABNORMAL HIGH (ref 70–99)
Potassium: 4.6 mmol/L (ref 3.5–5.2)
Sodium: 140 mmol/L (ref 134–144)
Total Protein: 6.9 g/dL (ref 6.0–8.5)
eGFR: 36 mL/min/{1.73_m2} — ABNORMAL LOW (ref >59)

## 2021-11-22 ENCOUNTER — Other Ambulatory Visit: Payer: Self-pay | Admitting: Family Medicine

## 2021-11-22 DIAGNOSIS — E1122 Type 2 diabetes mellitus with diabetic chronic kidney disease: Secondary | ICD-10-CM

## 2021-11-24 ENCOUNTER — Telehealth: Payer: Self-pay

## 2021-11-24 ENCOUNTER — Other Ambulatory Visit: Payer: Self-pay

## 2021-11-24 NOTE — Telephone Encounter (Signed)
Patient was called and a voicemail was left informing patient to return phone call for lab results. 

## 2021-11-24 NOTE — Telephone Encounter (Signed)
-----   Message from Hoy Register, MD sent at 11/22/2021  9:57 AM EST ----- Please inform her that kidney function has improved from previous labs but is still abnormal. I have referred her to Nephrology.

## 2021-11-26 ENCOUNTER — Other Ambulatory Visit: Payer: Self-pay

## 2021-11-26 ENCOUNTER — Ambulatory Visit: Payer: Self-pay | Attending: Family Medicine

## 2021-11-27 ENCOUNTER — Other Ambulatory Visit: Payer: Self-pay

## 2021-12-03 ENCOUNTER — Telehealth: Payer: Self-pay

## 2021-12-03 NOTE — Telephone Encounter (Signed)
-----   Message from Hoy Register, MD sent at 11/22/2021  9:57 AM EST ----- Please inform her that kidney function has improved from previous labs but is still abnormal. I have referred her to Nephrology.

## 2021-12-03 NOTE — Telephone Encounter (Signed)
Patient was called and a voicemail was left informing patient to return phone call for lab results.   Crm created, letter has been mailed.

## 2021-12-04 ENCOUNTER — Other Ambulatory Visit: Payer: Self-pay

## 2021-12-04 ENCOUNTER — Ambulatory Visit: Payer: Self-pay | Attending: Family Medicine | Admitting: Pharmacist

## 2021-12-05 ENCOUNTER — Telehealth: Payer: Self-pay | Admitting: Family Medicine

## 2021-12-05 NOTE — Telephone Encounter (Signed)
Copied from CRM [id number]. Topic: General - Other >> Dec 04, 2021 12:46 PM Samantha [name] A wrote: Reason for CRM: The patient has requested to peak with a member of clinical staff about their lab results  The patient has received the voicemail from [place] and would like to discuss their concerns further

## 2021-12-05 NOTE — Telephone Encounter (Signed)
Patient name and DOB has been verified Patient was informed of lab results. Patient had no questions.  

## 2021-12-18 ENCOUNTER — Other Ambulatory Visit: Payer: Self-pay

## 2021-12-18 ENCOUNTER — Encounter: Payer: Self-pay | Admitting: Adult Health

## 2021-12-18 ENCOUNTER — Ambulatory Visit (INDEPENDENT_AMBULATORY_CARE_PROVIDER_SITE_OTHER): Payer: Self-pay | Admitting: Adult Health

## 2021-12-18 VITALS — BP 136/74 | HR 69 | Ht 63.0 in | Wt 153.0 lb

## 2021-12-18 DIAGNOSIS — I63212 Cerebral infarction due to unspecified occlusion or stenosis of left vertebral arteries: Secondary | ICD-10-CM

## 2021-12-18 DIAGNOSIS — E785 Hyperlipidemia, unspecified: Secondary | ICD-10-CM

## 2021-12-18 DIAGNOSIS — E118 Type 2 diabetes mellitus with unspecified complications: Secondary | ICD-10-CM

## 2021-12-18 DIAGNOSIS — Z794 Long term (current) use of insulin: Secondary | ICD-10-CM

## 2021-12-18 DIAGNOSIS — I10 Essential (primary) hypertension: Secondary | ICD-10-CM

## 2021-12-18 NOTE — Progress Notes (Signed)
[facility] [address] [place]. [place] [address] [phone]       [place] Samantha [name] Date of Birth:  July 19, 1951 Medical Record Number:  [id number]   Reason for Referral:  hospital stroke follow up    SUBJECTIVE:   CHIEF COMPLAINT:  Chief Complaint  Patient presents with   Eye Problem    Rm 2 with daughter and interpreter here for hsp f/u- pt reports she has been doing well since being d/c home.    HPI:   Samantha [name] is a 70 year old female with history of hypertension, hyperlipidemia, PAD, diabetes who presented on 11/08/2021 with episode of vertigo, left-sided numbness and discoordination.  Personally reviewed hospitalization pertinent progress notes, lab work and imaging.  Evaluated by Dr. Erlinda Hong.  CT showed left cerebellum hypodensity.  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  MRA head and neck showed left M1 high-grade stenosis with short segment occlusion, left VA, basilar artery and tandem stenosis, bilateral fetal PCAs with left P2 moderate to high stenosis.  CTA head and neck again showed severe stenosis of left VA with possible dissection, severe left M1 segment stenosis, moderate atheromatous narrowing of proximal basilar artery and bilateral PCAs.  EF 60 to 65%, LDL 67, UDS negative, A1c 8.9.  Etiology likely due to large vessel disease including L VA stenosis vs dissection, BA stenosis and other intracranial stenosis.  Recommended DAPT for 3 months then Plavix alone and increase atorvastatin from 40 mg to 80 mg daily.  BP goal 130-150 given multifocal intracranial stenosis.  Other stroke risk factors include advanced age and CHF.  Therapy eval's recommended outpatient PT/OT.    Today, 12/18/2021, patient being seen for initial hospital follow-up accompanied by her daughter and [facility] health interpreter.  Reports she has recovered well without residual deficits.  Denies  new or reoccurring stroke/TIA symptoms. Compliant on aspirin and Plavix as well as atorvastatin without side effects.  Blood pressure today 136/74. Reports monitoring at home and typically 180s/70-110s. She will experience blurred vision with elevated levels.  PCP made medication adjustments last month. Glucose levels this morning 170 - last night 200s. No further concerns at this time.        PERTINENT IMAGING  Per recent hospitalization CT head Low-density foci in upper left cerebellum and left posterior parieto-occipital cortex. CTA head & neck Severe stenosis of left VA with possible dissection, severe left M1 segment stenosis, moderate atheromatous narrowing of proximal basilar artery and bilateral PCA MRI  acute left cerebellar infarct MRA  stenosis of distal left M1 MCA, severe stenosis of distal intradural left vertebral artery, moderate left P2 stenosis and stenosis of basilar artery, irregularity of left vertebral artery in the neck 2D Echo EF 123456, grade 1 diastolic dysfunction, no atrial level shunt. LDL 67 HgbA1c 8.9    ROS:   14 system review of systems performed and negative with exception of no complaints  PMH:  Past Medical History:  Diagnosis Date   Acute pyelonephritis    Bacteremia, escherichia coli 01/12/2015   Chest pain    Critical lower limb ischemia (HCC)    Hyperlipidemia    Hypertension    Left carotid bruit    PAD (peripheral artery disease) (HCC)    Type II diabetes mellitus (HCC)     PSH:  Past Surgical History:  Procedure Laterality Date   ABDOMINAL ANGIOGRAM  05/16/2015   Procedure: Abdominal Angiogram;  Surgeon: Lorretta Harp, MD;  Location: [facility];  Service: Cardiovascular;;   ABDOMINAL HYSTERECTOMY  ~ 2000   PERIPHERAL VASCULAR CATHETERIZATION Bilateral 05/16/2015   Procedure: Lower Extremity Angiography;  Surgeon: Lorretta Harp, MD; renal arteries widely patent, L-SFA 75%, L-pop 95%, L-peroneal 90%; R-SFA 40%, R-pop 50%,  R-prox peroneal 90%; directional atherectomy L-SFA, p-pop, drug-eluting balloon angioplasty reducing the stenoses to 0, small linear dissection was not flow limiting       PERIPHERAL VASCULAR CATHETERIZATION Left 05/16/2015   Procedure: Peripheral Vascular Atherectomy;  Surgeon: Lorretta Harp, MD;  Location: [facility];  Service: Cardiovascular;  Laterality: Left;  sfa    Social History:  Social History   Socioeconomic History   Marital status: Widowed    Spouse name: Not on file   Number of children: Not on file   Years of education: Not on file   Highest education level: Not on file  Occupational History   Not on file  Tobacco Use   Smoking status: Never   Smokeless tobacco: Never  Substance and Sexual Activity   Alcohol use: Yes    Alcohol/week: 1.0 - 2.0 standard drink    Types: 1 - 2 Cans of beer per week    Comment: occas   Drug use: No   Sexual activity: Never  Other Topics Concern   Not on file  Social History Narrative   Not on file   Social Determinants of Health   Financial Resource Strain: Not on file  Food Insecurity: Not on file  Transportation Needs: Not on file  Physical Activity: Not on file  Stress: Not on file  Social Connections: Not on file  Intimate Partner Violence: Not on file    Family History:  Family History  Problem Relation Age of Onset   Hypertension Mother     Medications:   Current Outpatient Medications on File Prior to Visit  Medication Sig Dispense Refill   amLODipine (NORVASC) 5 MG tablet Take 1 tablet (5 mg total) by mouth daily. 30 tablet 3   aspirin 325 MG tablet Take 1 tablet (325 mg total) by mouth daily. For 3 months only 30 tablet 2   atorvastatin (LIPITOR) 80 MG tablet Take 1 tablet (80 mg total) by mouth daily. 30 tablet 3   Blood Glucose Monitoring Suppl (TRUE METRIX METER) DEVI 1 each by Does not apply route 3 (three) times daily before meals. 1 Device 0   carvedilol (COREG) 25 MG tablet Take 1 tablet (25  mg total) by mouth 2 (two) times daily with a meal. 180 tablet 1   clopidogrel (PLAVIX) 75 MG tablet Take 1 tablet (75 mg total) by mouth daily. 30 tablet 3   glimepiride (AMARYL) 4 MG tablet Take 1 tablet (4 mg total) by mouth daily with breakfast. 90 tablet 1   glucose blood test strip Use 3 times daily before meals 100 each 12   insulin isophane & regular human KwikPen (HUMULIN 70/30 KWIKPEN) (70-30) 100 UNIT/ML KwikPen Inject subcutaneously twice daily 30 units in the morning and 25 units in the evening 90 mL 6   Insulin Pen Needle (TRUEPLUS PEN NEEDLES) 31G X 6 MM MISC Inject 1 each into the skin 2 (two) times daily. 100 each 12   Insulin Syringe-Needle U-100 (BD INSULIN SYRINGE ULTRAFINE) 31G X 15/64" 0.5 ML MISC Use subcutaneously twice daily 60 each 12   losartan (COZAAR) 100 MG tablet TAKE 1 TABLET BY MOUTH DAILY. (Patient taking differently: Take 100 mg by mouth daily.)  90 tablet 1   metFORMIN (GLUCOPHAGE) 1000 MG tablet Take 1 tablet (1,000 mg total) by mouth 2 (two) times daily with a meal. 180 tablet 1   TRUEPLUS LANCETS 28G MISC 1 each by Does not apply route 3 (three) times daily before meals. 100 each 12   No current facility-administered medications on file prior to visit.    Allergies:   Allergies  Allergen Reactions   Perflutren Other (See Comments)    Pt with back pain as soon as administered.      OBJECTIVE:  Physical Exam  Vitals:   12/18/21 1503  BP: 136/74  Pulse: 69  SpO2: 97%  Weight: 153 lb (69.4 kg)  Height: 5\' 3"  (1.6 m)   Body mass index is 27.1 kg/m. No results found.  Post stroke PHQ 2/9 Depression screen PHQ 2/9 11/19/2021  Decreased Interest 0  Down, Depressed, Hopeless 0  PHQ - 2 Score 0  Altered sleeping 0  Tired, decreased energy 1  Change in appetite 0  Feeling bad or failure about yourself  0  Trouble concentrating 0  Moving slowly or fidgety/restless 0  Suicidal thoughts 0  PHQ-9 Score 1  Some recent data might be hidden      General: well developed, well nourished, pleasant elderly female, seated, in no evident distress Head: head normocephalic and atraumatic.   Neck: supple with no carotid or supraclavicular bruits Cardiovascular: regular rate and rhythm, no murmurs Musculoskeletal: no deformity Skin:  no rash/petichiae Vascular:  Normal pulses all extremities   Neurologic Exam Mental Status: Awake and fully alert. Spanish speaking - denies speech/language difficulty. Oriented to place and time. Recent and remote memory intact. Attention span, concentration and fund of knowledge appropriate. Mood and affect appropriate.  Cranial Nerves: Fundoscopic exam reveals sharp disc margins. Pupils equal, briskly reactive to light. Extraocular movements full without nystagmus. Visual fields full to confrontation. Hearing intact. Facial sensation intact. Face, tongue, palate moves normally and symmetrically.  Motor: Normal bulk and tone. Normal strength in all tested extremity muscles Sensory.: intact to touch , pinprick , position and vibratory sensation.  Coordination: Rapid alternating movements normal in all extremities. Finger-to-nose and heel-to-shin performed accurately bilaterally. Gait and Station: Arises from chair without difficulty. Stance is normal. Gait demonstrates normal stride length and balance without use of AD. Tandem walk and heel toe with mild difficulty.  Reflexes: 1+ and symmetric. Toes downgoing.     NIHSS  0 Modified Rankin  0      ASSESSMENT: Samantha [name] is a 70 y.o. 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 Vascular risk factors include HTN, HLD, DM, diffuse intracranial stenosis, advanced age and CHF.      PLAN:  L SCA stroke :  Recovered well without residual deficit Continue aspirin 325 mg daily and clopidogrel 75 mg daily  and atorvastatin 80 mg  daily for secondary stroke prevention.  Continue aspirin for additional 2 months then discontinue.   Discussed secondary stroke prevention measures and importance of close PCP follow up for aggressive stroke risk factor management. I have gone over the pathophysiology of stroke, warning signs and symptoms, risk factors and their management in some detail with instructions to go to the closest emergency room for symptoms of concern. HTN: BP goal 130-150.  Stable today although uncontrolled per home readings -  question cuff accuracy. Discussed correct way to check blood pressure. Advised to  f/u with PCP if remains elevated HLD: LDL goal <70. Recent LDL 67.  Continue atorvastatin 80 mg daily DMII: A1c goal<7.0. Recent A1c 8.9. managed by PCP    Follow up in 6 months or call earlier if needed   CC:  [facility] provider: Dr. Pearlean Brownie PCP: Hoy Register, MD    I spent 54 minutes of face-to-face and non-face-to-face time with patient and daughter.  This included previsit chart review including review of recent hospitalization, lab review, study review, electronic health record documentation, patient and daughter education regarding recent stroke including etiology, secondary stroke prevention measures and importance of managing stroke risk factors, and answered all other questions to patient and daughters satisfaction  Ihor Austin, AGNP-BC  [facility] [address] [place], [place] [address]  Phone [phone] Fax [phone] Note: This document was prepared with digital dictation and possible smart phrase technology. Any transcriptional errors that result from this process are unintentional.

## 2021-12-18 NOTE — Progress Notes (Signed)
I agree with the above plan 

## 2021-12-18 NOTE — Patient Instructions (Addendum)
Continue aspirin 325 mg daily and clopidogrel 75 mg daily  and atorvastatin 80mg  daily  for secondary stroke prevention - continue aspirin for additional 2 months then stop   Please continue to monitor your blood pressure at home - if continues to be significantly elevated, please let your PCP know   Continue to follow up with PCP regarding cholesterol, blood pressure and diabetes management  Maintain strict control of hypertension with blood pressure goal between 130-150, diabetes with hemoglobin A1c goal below 7.0 % and cholesterol with LDL cholesterol (bad cholesterol) goal below 70 mg/dL.   Signs of a Stroke? Follow the BEFAST method:  Balance Watch for a sudden loss of balance, trouble with coordination or vertigo Eyes Is there a sudden loss of vision in one or both eyes? Or double vision?  Face: Ask the person to smile. Does one side of the face droop or is it numb?  Arms: Ask the person to raise both arms. Does one arm drift downward? Is there weakness or numbness of a leg? Speech: Ask the person to repeat a simple phrase. Does the speech sound slurred/strange? Is the person confused ? Time: If you observe any of these signs, call 911.     Followup in the future with me in 6 months or call earlier if needed      Thank you for coming to see at [facility]. I hope we have been able to provide you high quality care today.  You may receive a patient satisfaction survey over the next few weeks. We would appreciate your feedback and comments so that we may continue to improve ourselves and the health of our patients.  Prevencin del accidente cerebrovascular Stroke Prevention Algunas enfermedades y conductas pueden dar lugar a un aumento en el riesgo de sufrir un accidente cerebrovascular. Puede ayudar a prevenir un accidente cerebrovascular optando por una alimentacin saludable, hacer ejercicio, no fumar y [facility] bajo control cualquier afeccin mdica que  tenga. El accidente cerebrovascular es una de las principales causas del deterioro funcional. La prevencin primaria es especialmente importante porque la mayora de los accidentes cerebrovasculares son eventos que se producen por primera vez. El accidente cerebrovascular cambia la vida no solo de Pharmacologist lo tienen, sino tambin de su familia y otros cuidadores. Cmo puede afectarme esta enfermedad? Un accidente cerebrovascular es una emergencia mdica y debe tratarse de inmediato. El accidente cerebrovascular puede causar dao cerebral y, a veces, ser potencialmente mortal. Si la persona recibe tratamiento mdico de inmediato, hay ms probabilidades de sobrevivir y de recuperarse de un accidente cerebrovascular. Qu puede aumentar el riesgo? Las siguientes enfermedades pueden aumentar el riesgo de esta afeccin: Enfermedad cardiovascular. Presin arterial alta (hipertensin). Diabetes. Colesterol alto. Anemia drepanoctica. Trastornos de [employer]). Obesidad. Trastornos del sueo (apnea obstructiva del sueo). Otros factores de riesgo son los siguientes: Ser mayor de 60 aos de edad. Tener antecedentes de cogulos de [place], accidente cerebrovascular o miniaccidente cerebrovascular (accidente isqumico transitorio, AIT). Factores genticos, como raza, origen tnico o antecedentes familiares de accidente cerebrovascular. Fumar cigarrillos o consumir otros productos que contengan tabaco. Tomar pldoras anticonceptivas, en especial si tambin consume tabaco. Gran consumo de alcohol o drogas, especialmente cocana y metanfetamina. Falta de actividad fsica. Qu medidas de prevencin puedo tomar? Controle su estado de salud Niveles elevados de colesterol. Llevar una dieta saludable es importante para prevenir el colesterol alto. Si el colesterol no puede controlarse nicamente con la dieta, es posible que deba tomar medicamentos. [place]  para  controlar el colesterol como se lo haya indicado su mdico. Hipertensin. Para reducir su riesgo de accidente cerebrovascular, trate de mantener su presin arterial por debajo de 130/80. Llevar una dieta saludable y hacer ejercicio regularmente son importantes del control de la presin arterial. Si estas medidas no son suficientes para controlar su presin arterial, es posible que tenga que tomar medicamentos. Tome los medicamentos para controlar la hipertensin como se lo haya indicado su mdico. Pregntele a su mdico si debera medirse su presin arterial en el hogar. Controle su presin arterial todos los aos, incluso si su presin arterial es normal. La presin arterial aumenta con la edad y algunas afecciones mdicas. Diabetes. Llevar una dieta saludable y hacer ejercicio regularmente son partes importantes del control de su nivel de azcar en la sangre (glucosa). Si su nivel de azcar en la sangre no puede controlarse mediante dieta y ejercicio, es posible que deba tomar medicamentos. Tome los medicamentos para controlar la diabetes como se lo haya indicado su mdico. Hgase una evaluacin de la apnea obstructiva del sueo. Hable con su mdico sobre cmo hacer una evaluacin del sueo si ronca mucho o tiene excesiva somnolencia. Asegrese de tener bajo control cualquier otra afeccin mdica que tenga, como la fibrilacin auricular o la aterosclerosis. Nutricin Siga las instrucciones de su mdico sobre qu comer o beber para Contractor a Chief Operating Officer su estado de [place]. Estas instrucciones pueden incluir lo siguiente: Reducir su ingesta calrica diaria. Limitar la cantidad de sal (sodio) que ingiere a 1500 miligramos (mg) por Futures trader. Usar solo aceites saludables para cocinar, como el aceite de [place], canola o [place]. Consumir alimentos saludables. Para esto, puede hacer lo siguiente: Elija alimentos ricos en fibra, como cereales integrales y frutas y verduras frescas. Coma al menos 5 porciones de  frutas y verduras al da. Trate de llenar la mitad del plato de cada comida con frutas y verduras. Elija alimentos con protenas magras, como cortes de carne magros, carne de ave sin piel, pescado, tofu, frijoles y nueces. Coma productos lcteos descremados. Evite los alimentos con alto contenido de [place]. Esto puede ayudar a Personal assistant presin arterial. Evite los alimentos que contienen grasas saturadas, grasas trans y [place]. Esto puede ayudar a Architectural technologist. Evite los alimentos procesados y preparados. Contar su consumo diario de carbohidratos.  Estilo de vida Si bebe alcohol: Limite la cantidad que bebe a lo siguiente: De 0 a 1 medida por da para las mujeres que no estn embarazadas. De 0 a 2 medidas por da para los hombres. Sepa cunta cantidad de alcohol hay en las bebidas que toma. En los [address], una medida equivale a una botella de cerveza de 12 oz (355 ml), un vaso de vino de 5 oz (148 ml) o un vaso de una bebida alcohlica de alta graduacin de 1 oz (44 ml). No consuma ningn producto que contenga nicotina o tabaco. Estos productos incluyen cigarrillos, tabaco para Theatre manager y aparatos de vapeo, como los Administrator, Civil Service. Si necesita ayuda para dejar de fumar, consulte al mdico. Evite ser fumador pasivo. No consuma drogas. Actividad  Intente mantener un peso saludable. Trate de hacer al menos 30 minutos de ejercicio casi todos [place], por ejemplo: Caminar rpidamente. Andar en bicicleta. Natacin. Medicamentos [employer] medicamentos de venta libre y los recetados solamente como se lo haya indicado el mdico. Es posible que se le recomiende tomar aspirina o un diluyente de la sangre (antiplaquetario o anticoagulante) para reducir el riesgo de formacin de cogulos  de sangre que pueden provocar una accidente cerebrovascular. Evite tomar pldoras anticonceptivas. Hable con su mdico [employer] de tomar pldoras anticonceptivas si: Es mayor  de 35 aos. Fuma. Tiene dolores de [place] muy intensos. Alguna vez ha tenido un cogulo de [employer]. Dnde buscar ms informacin American Stroke Association (Asociacin Americana de Accidente Cerebrovascular): www.strokeassociation.org Solicite ayuda de inmediato si: Usted o un ser querido tiene algn sntoma de un accidente cerebrovascular. BE FAST es una manera fcil de recordar los principales signos de advertencia de un accidente cerebrovascular: B: Balance (equilibrio). Los signos son mareos, dificultad repentina para caminar o prdida del equilibrio. E: Eyes (ojos). Los signos son problemas para ver o un cambio repentino en la visin. F: Face (rostro). Los signos son debilidad repentina o adormecimiento del rostro, o el rostro o el prpado que se caen hacia un lado. A: Arms (brazos). Los signos son debilidad o adormecimiento en un brazo. Esto sucede de repente y generalmente en un lado del cuerpo. S: Speech (habla). Los signos son dificultad para hablar, hablar arrastrando las palabras o dificultad para comprender lo que las Forensic scientist. T: Time (tiempo). Es tiempo de llamar al servicio de Sports administrator. Anote la hora a la que [employer] sntomas. Usted o un ser querido tiene otros signos de un accidente cerebrovascular, como: Dolor de cabeza sbito e intenso que no tiene causa aparente. Nuseas o vmitos. Convulsiones. Estos sntomas pueden representar un problema grave que constituye Radio broadcast assistant. No espere a ver si los sntomas desaparecen. Solicite atencin mdica de inmediato. Comunquese con el servicio de emergencias de su localidad (911 en los [place]). No conduzca por sus propios medios [employer] hospital. Resumen Puede prevenir un accidente cerebrovascular optando por una alimentacin saludable, hacer ejercicio, no fumar, limitar el consumo de alcohol, y manteniendo bajo control cualquier afeccin mdica que tenga. No consuma ningn producto que contenga nicotina o  tabaco. Estos incluyen cigarrillos, tabaco para mascar y aparatos de vapeo, como los Administrator, Civil Service. Si necesita ayuda para dejar de fumar, consulte al mdico. Recuerde las seales de alerta de un accidente cerebrovascular BE FAST. Obtenga ayuda de inmediato si usted o un ser querido presenta alguna de estas seales de Control and instrumentation engineer. Esta informacin no tiene Theme park manager el consejo del mdico. Asegrese de hacerle al mdico cualquier pregunta que tenga. Document Revised: 08/30/2020 Document Reviewed: 08/30/2020 Elsevier Patient Education  2022 [employer].

## 2022-01-02 ENCOUNTER — Other Ambulatory Visit (HOSPITAL_COMMUNITY): Payer: Self-pay

## 2022-01-02 ENCOUNTER — Other Ambulatory Visit: Payer: Self-pay

## 2022-01-03 ENCOUNTER — Other Ambulatory Visit: Payer: Self-pay

## 2022-01-05 ENCOUNTER — Other Ambulatory Visit: Payer: Self-pay

## 2022-01-05 ENCOUNTER — Other Ambulatory Visit (HOSPITAL_COMMUNITY): Payer: Self-pay

## 2022-01-21 ENCOUNTER — Other Ambulatory Visit: Payer: Self-pay

## 2022-01-27 ENCOUNTER — Other Ambulatory Visit: Payer: Self-pay

## 2022-02-03 ENCOUNTER — Other Ambulatory Visit: Payer: Self-pay

## 2022-02-23 ENCOUNTER — Other Ambulatory Visit: Payer: Self-pay

## 2022-02-23 ENCOUNTER — Ambulatory Visit: Payer: Self-pay | Attending: Family Medicine | Admitting: Family Medicine

## 2022-02-23 VITALS — BP 186/78 | HR 73 | Ht 63.0 in | Wt 154.2 lb

## 2022-02-23 DIAGNOSIS — E1165 Type 2 diabetes mellitus with hyperglycemia: Secondary | ICD-10-CM

## 2022-02-23 DIAGNOSIS — H538 Other visual disturbances: Secondary | ICD-10-CM

## 2022-02-23 DIAGNOSIS — N1832 Chronic kidney disease, stage 3b: Secondary | ICD-10-CM

## 2022-02-23 DIAGNOSIS — Z1211 Encounter for screening for malignant neoplasm of colon: Secondary | ICD-10-CM

## 2022-02-23 DIAGNOSIS — I10 Essential (primary) hypertension: Secondary | ICD-10-CM

## 2022-02-23 DIAGNOSIS — E2839 Other primary ovarian failure: Secondary | ICD-10-CM

## 2022-02-23 DIAGNOSIS — Z1231 Encounter for screening mammogram for malignant neoplasm of breast: Secondary | ICD-10-CM

## 2022-02-23 DIAGNOSIS — Z794 Long term (current) use of insulin: Secondary | ICD-10-CM

## 2022-02-23 DIAGNOSIS — E1122 Type 2 diabetes mellitus with diabetic chronic kidney disease: Secondary | ICD-10-CM

## 2022-02-23 LAB — POCT GLYCOSYLATED HEMOGLOBIN (HGB A1C): HbA1c, POC (controlled diabetic range): 9.1 % — AB (ref 0.0–7.0)

## 2022-02-23 LAB — GLUCOSE, POCT (MANUAL RESULT ENTRY): POC Glucose: 177 mg/dl — AB (ref 70–99)

## 2022-02-23 MED ORDER — METFORMIN HCL 1000 MG PO TABS
1000.0000 mg | ORAL_TABLET | Freq: Two times a day (BID) | ORAL | 1 refills | Status: DC
  Filled 2022-02-23: qty 180, 90d supply, fill #0
  Filled 2022-07-01: qty 60, 30d supply, fill #0
  Filled 2022-09-20: qty 60, 30d supply, fill #1
  Filled 2022-11-10: qty 60, 30d supply, fill #2
  Filled 2023-01-12 – 2023-01-14 (×2): qty 60, 30d supply, fill #3

## 2022-02-23 MED ORDER — LOSARTAN POTASSIUM 100 MG PO TABS
ORAL_TABLET | ORAL | 1 refills | Status: DC
  Filled 2022-02-23: qty 90, fill #0
  Filled 2022-02-27: qty 30, 30d supply, fill #0
  Filled 2022-07-01: qty 30, 30d supply, fill #1
  Filled 2022-09-20: qty 30, 30d supply, fill #2
  Filled 2022-11-10: qty 30, 30d supply, fill #3
  Filled 2023-01-12 – 2023-01-14 (×2): qty 30, 30d supply, fill #4

## 2022-02-23 MED ORDER — CARVEDILOL 25 MG PO TABS
25.0000 mg | ORAL_TABLET | Freq: Two times a day (BID) | ORAL | 1 refills | Status: DC
  Filled 2022-02-23 – 2022-02-27 (×2): qty 180, 90d supply, fill #0
  Filled 2022-07-01: qty 60, 30d supply, fill #1
  Filled 2022-09-20: qty 60, 30d supply, fill #2
  Filled 2022-11-10: qty 60, 30d supply, fill #3

## 2022-02-23 MED ORDER — AMLODIPINE BESYLATE 10 MG PO TABS
10.0000 mg | ORAL_TABLET | Freq: Every day | ORAL | 1 refills | Status: DC
  Filled 2022-02-23 – 2023-01-14 (×2): qty 30, 30d supply, fill #0

## 2022-02-23 NOTE — Progress Notes (Signed)
Referral to eye doctor for eye redness and blurred vision.

## 2022-02-23 NOTE — Progress Notes (Signed)
Subjective:  Patient ID: Samantha [name], female    DOB: Jun 25, 1951  Age: 71 y.o. MRN: [id number]  CC: Diabetes   HPI Samantha [name] is a 71 y.o. year old female with a history of hypertension, type 2 diabetes mellitus (A1c 9.1), hyperlipidemia, depression, peripheral vascular disease.  Interval History: She was informed she had cataracts by the Ophthalmologist but 3 weeks ago she noticed a vertical line in the visual field of her R eye with worsening blurry vision. She saw an Ophthalmologist [facility] who diagnosed her with cataracts.  Her BP is elevated and she endorses compliance with her antihypertensives. She states at home her BP is controlled but daughter states systolic is around 173.   Random blood sugars are around 270 and fasting are around 114 per patient.She has been administerig 25 units bid rather than 30 units in the morning and 25 units in the evening.  A1c is 9.1 today.  Denies presence of neuropathy or hypoglycemic symptoms. 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  She does not ambulate with the aid of a cane.  Requests a handicap form for the DMV. Past Medical History:  Diagnosis Date   Acute pyelonephritis    Bacteremia, escherichia coli 01/12/2015   Chest pain    Critical lower limb ischemia (HCC)    Hyperlipidemia    Hypertension    Left carotid bruit    PAD (peripheral artery disease) (HCC)    Type II diabetes mellitus (HCC)     Past Surgical History:  Procedure Laterality Date   ABDOMINAL ANGIOGRAM  05/16/2015   Procedure: Abdominal Angiogram;  Surgeon: Runell GessJonathan J Berry, MD;  Location: [facility];  Service: Cardiovascular;;   ABDOMINAL HYSTERECTOMY  ~ 2000   PERIPHERAL VASCULAR CATHETERIZATION Bilateral 05/16/2015   Procedure: Lower Extremity Angiography;  Surgeon: Runell GessJonathan J Berry, MD; renal arteries widely patent, L-SFA 75%, L-pop 95%, L-peroneal 90%; R-SFA 40%, R-pop 50%,  R-prox peroneal 90%; directional atherectomy L-SFA, p-pop, drug-eluting balloon angioplasty reducing the stenoses to 0, small linear dissection was not flow limiting       PERIPHERAL VASCULAR CATHETERIZATION Left 05/16/2015   Procedure: Peripheral Vascular Atherectomy;  Surgeon: Runell GessJonathan J Berry, MD;  Location: [facility];  Service: Cardiovascular;  Laterality: Left;  sfa    Family History  Problem Relation Age of Onset   Hypertension Mother     Allergies  Allergen Reactions   Perflutren Other (See Comments)    Pt with back pain as soon as administered.    Outpatient Medications Prior to Visit  Medication Sig Dispense Refill   amLODipine (NORVASC) 5 MG tablet Take 1 tablet (5 mg total) by mouth daily. 30 tablet 3   atorvastatin (LIPITOR) 80 MG tablet Take 1 tablet (80 mg total) by mouth daily. 30 tablet 3   Blood Glucose Monitoring Suppl (TRUE METRIX METER) DEVI 1 each by Does not apply route 3 (three) times daily before meals. 1 Device 0   carvedilol (COREG) 25 MG tablet Take 1 tablet (25 mg total) by mouth 2 (two) times daily with a meal. 180 tablet 1   clopidogrel (PLAVIX) 75 MG tablet Take 1 tablet (75 mg total) by mouth daily. 30 tablet 3   glimepiride (AMARYL) 4 MG tablet Take 1 tablet (4 mg total) by mouth daily with breakfast. 90 tablet 1   glucose blood test strip Use 3 times daily before meals 100 each 12  insulin isophane & regular human KwikPen (HUMULIN 70/30 KWIKPEN) (70-30) 100 UNIT/ML KwikPen Inject subcutaneously twice daily 30 units in the morning and 25 units in the evening 90 mL 6   Insulin Pen Needle (TRUEPLUS PEN NEEDLES) 31G X 6 MM MISC Inject 1 each into the skin 2 (two) times daily. 100 each 12   Insulin Syringe-Needle U-100 (BD INSULIN SYRINGE ULTRAFINE) 31G X 15/64" 0.5 ML MISC Use subcutaneously twice daily 60 each 12   losartan (COZAAR) 100 MG tablet TAKE 1 TABLET BY MOUTH DAILY. (Patient taking differently: Take 100 mg by mouth daily.) 90 tablet 1    metFORMIN (GLUCOPHAGE) 1000 MG tablet Take 1 tablet (1,000 mg total) by mouth 2 (two) times daily with a meal. 180 tablet 1   TRUEPLUS LANCETS 28G MISC 1 each by Does not apply route 3 (three) times daily before meals. 100 each 12   No facility-administered medications prior to visit.     ROS Review of Systems  Constitutional:  Negative for activity change, appetite change and fatigue.  HENT:  Negative for congestion, sinus pressure and sore throat.   Eyes:  Negative for visual disturbance.  Respiratory:  Negative for cough, chest tightness, shortness of breath and wheezing.   Cardiovascular:  Negative for chest pain and palpitations.  Gastrointestinal:  Negative for abdominal distention, abdominal pain and constipation.  Endocrine: Negative for polydipsia.  Genitourinary:  Negative for dysuria and frequency.  Musculoskeletal:  Negative for arthralgias and back pain.  Skin:  Negative for rash.  Neurological:  Positive for weakness. Negative for tremors, light-headedness and numbness.  Hematological:  Does not bruise/bleed easily.  Psychiatric/Behavioral:  Negative for agitation and behavioral problems.    Objective:  BP (!) 186/78    Pulse 73    Ht 5\' 3"  (1.6 m)    Wt 154 lb 3.2 oz (69.9 kg)    SpO2 98%    BMI 27.32 kg/m   BP/Weight 02/23/2022 12/18/2021 [id number]  Systolic BP [id number]  Diastolic BP 78 74 80  Wt. (Lbs) 154.2 153 150  BMI 27.32 27.1 26.57      Physical Exam Constitutional:      Appearance: She is well-developed.  Cardiovascular:     Rate and Rhythm: Normal rate.     Heart sounds: Normal heart sounds. No murmur heard. Pulmonary:     Effort: Pulmonary effort is normal.     Breath sounds: Normal breath sounds. No wheezing or rales.  Chest:     Chest wall: No tenderness.  Abdominal:     General: Bowel sounds are normal. There is no distension.     Palpations: Abdomen is soft. There is no mass.     Tenderness: There is no abdominal tenderness.   Musculoskeletal:        General: Normal range of motion.     Right lower leg: No edema.     Left lower leg: No edema.  Neurological:     Mental Status: She is alert and oriented to person, place, and time.  Psychiatric:        Mood and Affect: Mood normal.    Diabetic Foot Exam - Simple   Simple Foot Form Diabetic Foot exam was performed with the following findings: Yes 02/23/2022  4:28 PM  Visual Inspection No deformities, no ulcerations, no other skin breakdown bilaterally: Yes Sensation Testing Intact to touch and monofilament testing bilaterally: Yes Pulse Check Posterior Tibialis and Dorsalis pulse intact bilaterally: Yes Comments     CMP  Latest Ref Rng & Units 11/19/2021 11/10/2021 11/08/2021  Glucose 70 - 99 mg/dL 219(H) 199(H) 188(H)  BUN 8 - 27 mg/dL 23 22 22   Creatinine 0.57 - 1.00 mg/dL 1.55(H) 1.72(H) 1.50(H)  Sodium 134 - 144 mmol/L 140 136 141  Potassium 3.5 - 5.2 mmol/L 4.6 4.0 3.5  Chloride 96 - 106 mmol/L 103 107 107  CO2 20 - 29 mmol/L 21 21(L) -  Calcium 8.7 - 10.3 mg/dL 9.5 8.8(L) -  Total Protein 6.0 - 8.5 g/dL 6.9 - -  Total Bilirubin 0.0 - 1.2 mg/dL 0.3 - -  Alkaline Phos 44 - 121 IU/L 149(H) - -  AST 0 - 40 IU/L 17 - -  ALT 0 - 32 IU/L 18 - -    Lipid Panel     Component Value Date/Time   CHOL 134 11/09/2021 0257   CHOL 139 09/13/2018 1002   TRIG 168 (H) 11/09/2021 0257   HDL 33 (L) 11/09/2021 0257   HDL 45 09/13/2018 1002   CHOLHDL 4.1 11/09/2021 0257   VLDL 34 11/09/2021 0257   LDLCALC 67 11/09/2021 0257   LDLCALC 74 09/13/2018 1002    CBC    Component Value Date/Time   WBC 9.3 11/10/2021 0330   RBC 3.89 11/10/2021 0330   HGB 12.5 11/10/2021 0330   HCT 36.2 11/10/2021 0330   PLT 327 11/10/2021 0330   MCV 93.1 11/10/2021 0330   MCH 32.1 11/10/2021 0330   MCHC 34.5 11/10/2021 0330   RDW 12.0 11/10/2021 0330   LYMPHSABS 1.4 11/08/2021 0935   MONOABS 0.5 11/08/2021 0935   EOSABS 0.3 11/08/2021 0935   BASOSABS 0.1 11/08/2021  0935    Lab Results  Component Value Date   HGBA1C 9.1 (A) 02/23/2022    Assessment & Plan:  1. Type 2 diabetes mellitus with stage 3b chronic kidney disease, with long-term current use of insulin (HCC) Uncontrolled with A1c of 9.1; goal is less than 7.0 Encouraged to adjust her regimen to previously prescribed regimen of 30 units in the morning and 25 in the evening of her Humulin 70/30 Counseled on Diabetic diet, my plate method, [id number] minutes of moderate intensity exercise/week Blood sugar logs with fasting goals of 80-120 mg/dl, random of less than 180 and in the event of sugars less than 60 mg/dl or greater than 400 mg/dl encouraged to notify the clinic. Advised on the need for annual eye exams, annual foot exams, Pneumonia vaccine. - POCT glucose (manual entry) - POCT glycosylated hemoglobin (Hb A1C)  2. Accelerated hypertension Uncontrolled Increased dose of amlodipine She will follow-up with the clinical pharmacist in 1 week to reassess her blood pressure Counseled on blood pressure goal of less than 130/80, low-sodium, DASH diet, medication compliance, 150 minutes of moderate intensity exercise per week. Discussed medication compliance, adverse effects. - carvedilol (COREG) 25 MG tablet; Take 1 tablet (25 mg total) by mouth 2 (two) times daily with a meal.  Dispense: 180 tablet; Refill: 1 - losartan (COZAAR) 100 MG tablet; TAKE 1 TABLET BY MOUTH DAILY.  Dispense: 90 tablet; Refill: 1 - amLODipine (NORVASC) 10 MG tablet; Take 1 tablet (10 mg total) by mouth daily.  Dispense: 90 tablet; Refill: 1  3. Type 2 diabetes mellitus with hyperglycemia, with long-term current use of insulin (HCC) See #1 above - metFORMIN (GLUCOPHAGE) 1000 MG tablet; Take 1 tablet (1,000 mg total) by mouth 2 (two) times daily with a meal.  Dispense: 180 tablet; Refill: 1  4. Blurry vision, bilateral In the setting  of cataract She will need urgent evaluation to evaluate for diabetic retinopathy Advised  that due to lack of medical coverage the ophthalmology office might request a payment plan of them and they are willing to proceed with this option - Ambulatory referral to Ophthalmology  5. Encounter for screening mammogram for malignant neoplasm of breast - MM 3D SCREEN BREAST BILATERAL; Future  6. Screening for colon cancer - Fecal occult blood, imunochemical([facility])  7. Estrogen deficiency - DG Bone Density; Future    No orders of the defined types were placed in this encounter.   Return in about 1 week (around 03/02/2022) for Blood pressure follow-up with Lurena Joiner; 3 months with PCP.       Charlott Rakes, MD, FAAFP. [facility] and [place], [place]   02/23/2022, 4:12 PM

## 2022-03-02 ENCOUNTER — Other Ambulatory Visit: Payer: Self-pay

## 2022-03-19 ENCOUNTER — Ambulatory Visit: Payer: Self-pay | Admitting: Pharmacist

## 2022-04-02 ENCOUNTER — Other Ambulatory Visit: Payer: Self-pay

## 2022-05-26 ENCOUNTER — Ambulatory Visit: Payer: Self-pay | Admitting: Family Medicine

## 2022-06-16 ENCOUNTER — Other Ambulatory Visit: Payer: Self-pay

## 2022-06-18 ENCOUNTER — Encounter: Payer: Self-pay | Admitting: Adult Health

## 2022-06-18 ENCOUNTER — Ambulatory Visit (INDEPENDENT_AMBULATORY_CARE_PROVIDER_SITE_OTHER): Payer: Self-pay | Admitting: Adult Health

## 2022-06-18 VITALS — BP 164/86 | HR 75 | Ht 65.0 in | Wt 161.0 lb

## 2022-06-18 DIAGNOSIS — I63212 Cerebral infarction due to unspecified occlusion or stenosis of left vertebral arteries: Secondary | ICD-10-CM

## 2022-06-18 NOTE — Patient Instructions (Addendum)
Overall stable from stroke standpoint and recommend follow up as needed at this time   Recommendations:   Please follow up with your PCP regarding left leg weakness sensation as this could be due from a tendon or knee issue  Continue clopidogrel 75 mg daily  and atorvastatin for secondary stroke prevention  Continue to follow up with PCP regarding cholesterol, blood pressure and diabetes management - please schedule a follow-up visit with your primary care doctor for uncontrolled blood pressure and ongoing monitoring of diabetes and cholesterol Maintain strict control of hypertension with blood pressure goal below 130/90, diabetes with hemoglobin A1c goal below 7.0 % and cholesterol with LDL cholesterol (bad cholesterol) goal below 70 mg/dL.   Signs of a Stroke? Follow the BEFAST method:  Balance Watch for a sudden loss of balance, trouble with coordination or vertigo Eyes Is there a sudden loss of vision in one or both eyes? Or double vision?  Face: Ask the person to smile. Does one side of the face droop or is it numb?  Arms: Ask the person to raise both arms. Does one arm drift downward? Is there weakness or numbness of a leg? Speech: Ask the person to repeat a simple phrase. Does the speech sound slurred/strange? Is the person confused ? Time: If you observe any of these signs, call 911.       Thank you for coming to see [place] at [facility]. I hope we have been able to provide you high quality care today.  You may receive a patient satisfaction survey over the next few weeks. We would appreciate your feedback and comments so that we may continue to improve ourselves and the health of our patients.

## 2022-06-18 NOTE — Progress Notes (Signed)
[facility] [address] [place]. [place] [address] [phone]       STROKE FOLLOW UP NOTE  [name] Date of Birth:  [birth year] Medical Record Number:  [id number]   Reason for Referral: stroke follow up    SUBJECTIVE:   CHIEF COMPLAINT:  Chief Complaint  Patient presents with   Follow-up    Rm 3 with grandson and interpreter (carlos) here for 6 month f/u. Reports she has been doing well over the last 6 months. Occasional left leg numbness but not everyday and mostly when she stands up.     HPI:   Update 06/18/2022 JM: Patient returns for 45-month stroke follow-up accompanied by grandson and [place] interpreter.  At prior visit, reports she recovered well without residual deficits.  She now reports occasional left leg weakness only on anterior thigh when trying to stand, step or walk - reports this has been present since her stroke. Can feel unsteady or as if her leg will not hold her up.  Denies any right leg symptoms.  Denies any pain in left hip, knee or in lower back.  Denies any numbness.  Ambulates without assistive device, denies any recent falls.  Denies any other residual stroke deficits or new stroke/TIA symptoms. Compliant on Plavix and atorvastatin, denies side effects.  Blood pressure today 199/89 and on recheck 164/86. Does not routinely monitor at home - grandson is currently going through nursing school - he will ensure this starts to get monitored at home.  Prior A1c 9.1 (01/2022).  Has not had recent follow-up with PCP.  No further concerns at this time.     History provided for reference purposes only Initial visit 12/18/2021 JM: Patient being seen for initial hospital follow-up accompanied by her daughter and [facility] health interpreter.  Reports she has recovered well without residual deficits.  Denies new or reoccurring stroke/TIA symptoms. Compliant on aspirin and Plavix as well as atorvastatin without side effects.  Blood  pressure today 136/74. Reports monitoring at home and typically 180s/70-110s. She will experience blurred vision with elevated levels.  PCP made medication adjustments last month. Glucose levels this morning 170 - last night 200s. No further concerns at this time.   Stroke admission 06/18/2022 Samantha [name] is a 71 year old female with history of hypertension, hyperlipidemia, PAD, diabetes who presented on 11/08/2021 with episode of vertigo, left-sided numbness and discoordination.  Personally reviewed hospitalization pertinent progress notes, lab work and imaging.  Evaluated by Dr. Erlinda Hong.  CT showed left cerebellum hypodensity.  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  MRA head and neck showed left M1 high-grade stenosis with short segment occlusion, left VA, basilar artery and tandem stenosis, bilateral fetal PCAs with left P2 moderate to high stenosis.  CTA head and neck again showed severe stenosis of left VA with possible dissection, severe left M1 segment stenosis, moderate atheromatous narrowing of proximal basilar artery and bilateral PCAs.  EF 60 to 65%, LDL 67, UDS negative, A1c 8.9.  Etiology likely due to large vessel disease including L VA stenosis vs dissection, BA stenosis and other intracranial stenosis.  Recommended DAPT for 3 months then Plavix alone and increase atorvastatin from 40 mg to 80 mg daily.  BP goal 130-150 given multifocal intracranial stenosis.  Other stroke risk factors include advanced age and CHF.  Therapy eval's recommended outpatient PT/OT.       PERTINENT IMAGING  Per recent hospitalization CT head Low-density foci in upper left cerebellum and  left posterior parieto-occipital cortex. CTA head & neck Severe stenosis of left VA with possible dissection, severe left M1 segment stenosis, moderate atheromatous narrowing of proximal basilar artery and bilateral PCA MRI  acute left cerebellar infarct MRA  stenosis of  distal left M1 MCA, severe stenosis of distal intradural left vertebral artery, moderate left P2 stenosis and stenosis of basilar artery, irregularity of left vertebral artery in the neck 2D Echo EF 60-65%, grade 1 diastolic dysfunction, no atrial level shunt. LDL 67 HgbA1c 8.9    ROS:   14 system review of systems performed and negative with exception of those listed in HPI  PMH:  Past Medical History:  Diagnosis Date   Acute pyelonephritis    Bacteremia, escherichia coli 01/12/2015   Chest pain    Critical lower limb ischemia (HCC)    Hyperlipidemia    Hypertension    Left carotid bruit    PAD (peripheral artery disease) (HCC)    Type II diabetes mellitus (HCC)     PSH:  Past Surgical History:  Procedure Laterality Date   ABDOMINAL ANGIOGRAM  05/16/2015   Procedure: Abdominal Angiogram;  Surgeon: Runell Gess, MD;  Location: [facility];  Service: Cardiovascular;;   ABDOMINAL HYSTERECTOMY  ~ 2000   PERIPHERAL VASCULAR CATHETERIZATION Bilateral 05/16/2015   Procedure: Lower Extremity Angiography;  Surgeon: Runell Gess, MD; renal arteries widely patent, L-SFA 75%, L-pop 95%, L-peroneal 90%; R-SFA 40%, R-pop 50%, R-prox peroneal 90%; directional atherectomy L-SFA, p-pop, drug-eluting balloon angioplasty reducing the stenoses to 0, small linear dissection was not flow limiting       PERIPHERAL VASCULAR CATHETERIZATION Left 05/16/2015   Procedure: Peripheral Vascular Atherectomy;  Surgeon: Runell Gess, MD;  Location: [facility];  Service: Cardiovascular;  Laterality: Left;  sfa    Social History:  Social History   Socioeconomic History   Marital status: Widowed    Spouse name: Not on file   Number of children: Not on file   Years of education: Not on file   Highest education level: Not on file  Occupational History   Not on file  Tobacco Use   Smoking status: Never   Smokeless tobacco: Never  Substance and Sexual Activity   Alcohol use: Yes     Alcohol/week: 1.0 - 2.0 standard drink of alcohol    Types: 1 - 2 Cans of beer per week    Comment: occas   Drug use: No   Sexual activity: Never  Other Topics Concern   Not on file  Social History Narrative   Not on file   Social Determinants of Health   Financial Resource Strain: Not on file  Food Insecurity: Not on file  Transportation Needs: Not on file  Physical Activity: Not on file  Stress: Not on file  Social Connections: Not on file  Intimate Partner Violence: Not on file    Family History:  Family History  Problem Relation Age of Onset   Hypertension Mother     Medications:   Current Outpatient Medications on File Prior to Visit  Medication Sig Dispense Refill   amLODipine (NORVASC) 10 MG tablet Take 1 tablet (10 mg total) by mouth daily. 90 tablet 1   atorvastatin (LIPITOR) 80 MG tablet Take 1 tablet (80 mg total) by mouth daily. 30 tablet 3   Blood Glucose Monitoring Suppl (TRUE METRIX METER) DEVI 1 each by Does not apply route 3 (three) times daily before meals. 1 Device 0   carvedilol (  COREG) 25 MG tablet Take 1 tablet (25 mg total) by mouth 2 (two) times daily with a meal. 180 tablet 1   clopidogrel (PLAVIX) 75 MG tablet Take 1 tablet (75 mg total) by mouth daily. 30 tablet 3   glimepiride (AMARYL) 4 MG tablet Take 1 tablet (4 mg total) by mouth daily with breakfast. 90 tablet 1   glucose blood test strip Use 3 times daily before meals 100 each 12   insulin isophane & regular human KwikPen (HUMULIN 70/30 KWIKPEN) (70-30) 100 UNIT/ML KwikPen Inject subcutaneously twice daily 30 units in the morning and 25 units in the evening 90 mL 6   Insulin Pen Needle (TRUEPLUS PEN NEEDLES) 31G X 6 MM MISC Inject 1 each into the skin 2 (two) times daily. 100 each 12   Insulin Syringe-Needle U-100 (BD INSULIN SYRINGE ULTRAFINE) 31G X 15/64" 0.5 ML MISC Use subcutaneously twice daily 60 each 12   losartan (COZAAR) 100 MG tablet TAKE 1 TABLET BY MOUTH DAILY. 90 tablet 1    metFORMIN (GLUCOPHAGE) 1000 MG tablet Take 1 tablet (1,000 mg total) by mouth 2 (two) times daily with a meal. 180 tablet 1   TRUEPLUS LANCETS 28G MISC 1 each by Does not apply route 3 (three) times daily before meals. 100 each 12   No current facility-administered medications on file prior to visit.    Allergies:   Allergies  Allergen Reactions   Perflutren Other (See Comments)    Pt with back pain as soon as administered.      OBJECTIVE:  Physical Exam  Vitals:   06/18/22 1522 06/18/22 1544  BP: (!) 199/89 (!) 164/86  Pulse: 75   Weight: 161 lb (73 kg)   Height: 5\' 5"  (1.651 m)     Body mass index is 26.79 kg/m. No results found.   General: well developed, well nourished, very pleasant elderly female, seated, in no evident distress Head: head normocephalic and atraumatic.   Neck: supple with no carotid or supraclavicular bruits Cardiovascular: regular rate and rhythm, no murmurs Musculoskeletal: no deformity; tenderness to palpation of left rectus femoris Skin:  no rash/petichiae Vascular:  Normal pulses all extremities   Neurologic Exam Mental Status: Awake and fully alert. Spanish speaking - denies speech/language difficulty. Oriented to place and time. Recent and remote memory intact. Attention span, concentration and fund of knowledge appropriate. Mood and affect appropriate.  Cranial Nerves: Pupils equal, briskly reactive to light. Extraocular movements full without nystagmus. Visual fields full to confrontation. Hearing intact. Facial sensation intact. Face, tongue, palate moves normally and symmetrically.  Motor: Normal bulk and tone. Normal strength in all tested extremity muscles Sensory.: intact to touch , pinprick , position and vibratory sensation.  Coordination: Rapid alternating movements normal in all extremities. Finger-to-nose and heel-to-shin performed accurately bilaterally. Gait and Station: Arises from chair without difficulty. Stance is normal.  Gait demonstrates initial mild favoring of left leg but then normal stride length and balance without use of AD.   Reflexes: 1+ and symmetric. Toes downgoing.          ASSESSMENT: Samantha [name] is a 71 y.o. 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 Vascular risk factors include HTN, HLD, DM, diffuse intracranial stenosis, advanced age and CHF.      PLAN:  L SCA stroke :  Initially, reported recovered without residual deficits but today reports continued occasional left thigh weakness when  standing, stepping or walking - confirms this has been present since her stroke. Does have some tightness and tenderness over the left rectus femoris - unsure if etiology is stroke related but did advise to f/u with PCP for further evaluation and possible need of Ortho consult. Declined interest in PT at this present time due to lack of insurance Continue clopidogrel 75 mg daily  and atorvastatin 80 mg daily for secondary stroke prevention which are managed by PCP Discussed secondary stroke prevention measures and importance of close PCP follow up and importance of scheduling follow-up visit for uncontrolled vascular risk factors.  Discussed importance of aggressive stroke risk factor management including BP goal<130/90 with avoidance of hypotension, HLD with LDL goal<70 and DM with A1c.<7.  I have gone over the pathophysiology of stroke, warning signs and symptoms, risk factors and their management in some detail with instructions to go to the closest emergency room for symptoms of concern.    Stable from stroke standpoint without further recommendations and risk factors are managed by PCP. She may follow up PRN, as usual for our patients who are strictly being followed for stroke. If any new neurological issues should arise, request PCP place referral for evaluation by one of our  neurologists. Thank you.     CC:  PCP: Charlott Rakes, MD    I spent 36 minutes of face-to-face and non-face-to-face time with patient and grandson accompanied by interpreter.  This included previsit chart review, lab review, study review, electronic health record documentation, patient and son education regarding prior stroke and reported residual deficits, secondary stroke prevention measures and importance of managing stroke risk factors, and answered all other questions to patient and sons satisfaction  Frann Rider, AGNP-BC  [facility] [address] [place], [place] [address]  Phone [phone] Fax [phone] Note: This document was prepared with digital dictation and possible smart phrase technology. Any transcriptional errors that result from this process are unintentional.

## 2022-07-02 ENCOUNTER — Other Ambulatory Visit: Payer: Self-pay

## 2022-07-06 ENCOUNTER — Other Ambulatory Visit: Payer: Self-pay

## 2022-07-10 ENCOUNTER — Other Ambulatory Visit (HOSPITAL_COMMUNITY): Payer: Self-pay

## 2022-09-21 ENCOUNTER — Other Ambulatory Visit: Payer: Self-pay

## 2022-11-10 ENCOUNTER — Other Ambulatory Visit: Payer: Self-pay | Admitting: Family Medicine

## 2022-11-10 ENCOUNTER — Other Ambulatory Visit: Payer: Self-pay

## 2022-11-10 DIAGNOSIS — Z794 Long term (current) use of insulin: Secondary | ICD-10-CM

## 2022-11-13 ENCOUNTER — Other Ambulatory Visit: Payer: Self-pay

## 2022-11-27 ENCOUNTER — Other Ambulatory Visit: Payer: Self-pay

## 2022-11-30 ENCOUNTER — Other Ambulatory Visit: Payer: Self-pay | Admitting: Family Medicine

## 2022-11-30 DIAGNOSIS — E1165 Type 2 diabetes mellitus with hyperglycemia: Secondary | ICD-10-CM

## 2022-12-18 ENCOUNTER — Other Ambulatory Visit: Payer: Self-pay

## 2023-01-12 ENCOUNTER — Other Ambulatory Visit: Payer: Self-pay

## 2023-01-14 ENCOUNTER — Other Ambulatory Visit: Payer: Self-pay | Admitting: Family Medicine

## 2023-01-14 ENCOUNTER — Other Ambulatory Visit: Payer: Self-pay

## 2023-01-14 DIAGNOSIS — I10 Essential (primary) hypertension: Secondary | ICD-10-CM

## 2023-01-14 DIAGNOSIS — E118 Type 2 diabetes mellitus with unspecified complications: Secondary | ICD-10-CM

## 2023-01-14 NOTE — Telephone Encounter (Signed)
Requested medication (s) are due for refill today: yes  Requested medication (s) are on the active medication list: yes    Last refill: coreg  02/23/22  #180  1 refill  Pen needles 08/19/21 100 12  Future visit scheduled no  Notes to clinic:Coreg failed due to labs, Insulin pen needles expired. Please review, thank you.  Requested Prescriptions  Pending Prescriptions Disp Refills   Insulin Pen Needle (TRUEPLUS 5-BEVEL PEN NEEDLES) 31G X 6 MM MISC 100 each 12    Sig: Inject 1 each into the skin 2 (two) times daily.     Endocrinology: Diabetes - Testing Supplies Passed - 01/14/2023  8:43 AM      Passed - Valid encounter within last 12 months    Recent Outpatient Visits           10 months ago Type 2 diabetes mellitus with stage 3b chronic kidney disease, with long-term current use of insulin ([place])   [place], Charlane Ferretti, MD   1 year ago Type 2 diabetes mellitus with stage 3b chronic kidney disease, with long-term current use of insulin ([place])   [place], Charlane Ferretti, MD   1 year ago Type 2 diabetes mellitus with hyperglycemia, with long-term current use of insulin ([place])   [place], [place], MD   3 years ago Essential hypertension   [place], Charlane Ferretti, MD   3 years ago Type 2 diabetes mellitus with complication, with long-term current use of insulin ([place])   [place], [place], MD               carvedilol (COREG) 25 MG tablet 180 tablet 1    Sig: Take 1 tablet (25 mg total) by mouth 2 (two) times daily with a meal.     Cardiovascular: Beta Blockers 3 Failed - 01/14/2023  8:43 AM      Failed - Cr in normal range and within 360 days    Creat  Date Value Ref Range Status  12/23/2016 1.02 (H) 0.50 - 0.99 mg/dL Final    Comment:      For patients > or = 70 years of age: The upper reference  limit for Creatinine is approximately 13% higher for people identified as African-American.      Creatinine, Ser  Date Value Ref Range Status  11/19/2021 1.55 (H) 0.57 - 1.00 mg/dL Final   Creatinine, POC  Date Value Ref Range Status  06/28/2017 50 mg/dL Final   Creatinine, Urine  Date Value Ref Range Status  06/10/2016 172 20 - 320 mg/dL Final         Failed - AST in normal range and within 360 days    AST  Date Value Ref Range Status  11/19/2021 17 0 - 40 IU/L Final         Failed - ALT in normal range and within 360 days    ALT  Date Value Ref Range Status  11/19/2021 18 0 - 32 IU/L Final         Failed - Last BP in normal range    BP Readings from Last 1 Encounters:  06/18/22 (!) 164/86         Failed - Valid encounter within last 6 months    Recent Outpatient Visits           10 months ago Type 2 diabetes mellitus  with stage 3b chronic kidney disease, with long-term current use of insulin ([place])   [place], [place], MD   1 year ago Type 2 diabetes mellitus with stage 3b chronic kidney disease, with long-term current use of insulin ([place])   [place], [place], MD   1 year ago Type 2 diabetes mellitus with hyperglycemia, with long-term current use of insulin ([place])   [place] Charlott Rakes, MD   3 years ago Essential hypertension   [place], Charlane Ferretti, MD   3 years ago Type 2 diabetes mellitus with complication, with long-term current use of insulin [facility])   [place], [place], MD              Passed - Last Heart Rate in normal range    Pulse Readings from Last 1 Encounters:  06/18/22 75

## 2023-01-15 ENCOUNTER — Other Ambulatory Visit: Payer: Self-pay

## 2023-01-15 NOTE — Telephone Encounter (Signed)
Patient will need to hav office visit.

## 2023-01-18 ENCOUNTER — Other Ambulatory Visit: Payer: Self-pay

## 2023-01-25 ENCOUNTER — Other Ambulatory Visit: Payer: Self-pay | Admitting: Family Medicine

## 2023-01-25 ENCOUNTER — Other Ambulatory Visit: Payer: Self-pay

## 2023-01-25 ENCOUNTER — Other Ambulatory Visit: Payer: Self-pay | Admitting: Pharmacist

## 2023-01-25 DIAGNOSIS — E118 Type 2 diabetes mellitus with unspecified complications: Secondary | ICD-10-CM

## 2023-01-25 DIAGNOSIS — I10 Essential (primary) hypertension: Secondary | ICD-10-CM

## 2023-01-25 MED ORDER — TRUEPLUS PEN NEEDLES 31G X 6 MM MISC
1.0000 | Freq: Two times a day (BID) | 0 refills | Status: DC
  Filled 2023-01-25 – 2023-02-08 (×2): qty 100, 50d supply, fill #0

## 2023-01-25 MED ORDER — TRUEPLUS PEN NEEDLES 31G X 6 MM MISC
1.0000 | Freq: Two times a day (BID) | 0 refills | Status: DC
  Filled 2023-01-25: qty 180, 90d supply, fill #0

## 2023-01-25 NOTE — Telephone Encounter (Signed)
Requested Prescriptions  Pending Prescriptions Disp Refills   Insulin Pen Needle (TRUEPLUS PEN NEEDLES) 31G X 6 MM MISC 180 each 0    Sig: Inject 1 each into the skin 2 (two) times daily.     Endocrinology: Diabetes - Testing Supplies Passed - 01/25/2023  3:27 PM      Passed - Valid encounter within last 12 months    Recent Outpatient Visits           11 months ago Type 2 diabetes mellitus with stage 3b chronic kidney disease, with long-term current use of insulin ([place])   [place], [place], MD   1 year ago Type 2 diabetes mellitus with stage 3b chronic kidney disease, with long-term current use of insulin ([place])   [place], [place], MD   1 year ago Type 2 diabetes mellitus with hyperglycemia, with long-term current use of insulin ([place])   [place], [place], MD   3 years ago Essential hypertension   [place], Charlane Ferretti, MD   4 years ago Type 2 diabetes mellitus with complication, with long-term current use of insulin ([place])   [place], Charlane Ferretti, MD       Future Appointments             In 2 months Charlott Rakes, MD [place]             carvedilol (COREG) 25 MG tablet 180 tablet 1    Sig: Take 1 tablet (25 mg total) by mouth 2 (two) times daily with a meal.     Cardiovascular: Beta Blockers 3 Failed - 01/25/2023  3:27 PM      Failed - Cr in normal range and within 360 days    Creat  Date Value Ref Range Status  12/23/2016 1.02 (H) 0.50 - 0.99 mg/dL Final    Comment:      For patients > or = 70 years of age: The upper reference limit for Creatinine is approximately 13% higher for people identified as African-American.      Creatinine, Ser  Date Value Ref Range Status  11/19/2021 1.55 (H) 0.57 - 1.00 mg/dL Final    Creatinine, POC  Date Value Ref Range Status  06/28/2017 50 mg/dL Final   Creatinine, Urine  Date Value Ref Range Status  06/10/2016 172 20 - 320 mg/dL Final         Failed - AST in normal range and within 360 days    AST  Date Value Ref Range Status  11/19/2021 17 0 - 40 IU/L Final         Failed - ALT in normal range and within 360 days    ALT  Date Value Ref Range Status  11/19/2021 18 0 - 32 IU/L Final         Failed - Last BP in normal range    BP Readings from Last 1 Encounters:  06/18/22 (!) 164/86         Failed - Valid encounter within last 6 months    Recent Outpatient Visits           11 months ago Type 2 diabetes mellitus with stage 3b chronic kidney disease, with long-term current use of insulin ([place])   [place] Charlott Rakes, MD  1 year ago Type 2 diabetes mellitus with stage 3b chronic kidney disease, with long-term current use of insulin ([place])   [place], [place], MD   1 year ago Type 2 diabetes mellitus with hyperglycemia, with long-term current use of insulin [facility])   [place] Charlott Rakes, MD   3 years ago Essential hypertension   [place], Charlane Ferretti, MD   4 years ago Type 2 diabetes mellitus with complication, with long-term current use of insulin [facility])   [place], [place], MD       Future Appointments             In 2 months Charlott Rakes, MD [place]            Passed - Last Heart Rate in normal range    Pulse Readings from Last 1 Encounters:  06/18/22 75

## 2023-01-25 NOTE — Telephone Encounter (Signed)
Requested medications are due for refill today.  yes  Requested medications are on the active medications list.  yes  Last refill. 02/23/2022  Future visit scheduled.   yes  Notes to clinic. Labs are expired.    Requested Prescriptions  Pending Prescriptions Disp Refills   carvedilol (COREG) 25 MG tablet 180 tablet 1    Sig: Take 1 tablet (25 mg total) by mouth 2 (two) times daily with a meal.     Cardiovascular: Beta Blockers 3 Failed - 01/25/2023  3:27 PM      Failed - Cr in normal range and within 360 days    Creat  Date Value Ref Range Status  12/23/2016 1.02 (H) 0.50 - 0.99 mg/dL Final    Comment:      For patients > or = 70 years of age: The upper reference limit for Creatinine is approximately 13% higher for people identified as African-American.      Creatinine, Ser  Date Value Ref Range Status  11/19/2021 1.55 (H) 0.57 - 1.00 mg/dL Final   Creatinine, POC  Date Value Ref Range Status  06/28/2017 50 mg/dL Final   Creatinine, Urine  Date Value Ref Range Status  06/10/2016 172 20 - 320 mg/dL Final         Failed - AST in normal range and within 360 days    AST  Date Value Ref Range Status  11/19/2021 17 0 - 40 IU/L Final         Failed - ALT in normal range and within 360 days    ALT  Date Value Ref Range Status  11/19/2021 18 0 - 32 IU/L Final         Failed - Last BP in normal range    BP Readings from Last 1 Encounters:  06/18/22 (!) 164/86         Failed - Valid encounter within last 6 months    Recent Outpatient Visits           11 months ago Type 2 diabetes mellitus with stage 3b chronic kidney disease, with long-term current use of insulin ([place])   [place], Charlane Ferretti, MD   1 year ago Type 2 diabetes mellitus with stage 3b chronic kidney disease, with long-term current use of insulin ([place])   [place], [place], MD   1 year ago Type 2 diabetes mellitus  with hyperglycemia, with long-term current use of insulin ([place])   [place] Charlott Rakes, MD   3 years ago Essential hypertension   [place], [place], MD   4 years ago Type 2 diabetes mellitus with complication, with long-term current use of insulin ([place])   [place], [place]ong, MD       Future Appointments             In 2 months Charlott Rakes, MD [place]            Passed - Last Heart Rate in normal range    Pulse Readings from Last 1 Encounters:  06/18/22 75         Signed Prescriptions Disp Refills   Insulin Pen Needle (TRUEPLUS PEN NEEDLES) 31G X 6 MM MISC 180 each 0    Sig: Inject 1 each into the skin 2 (two) times daily.  Endocrinology: Diabetes - Testing Supplies Passed - 01/25/2023  3:27 PM      Passed - Valid encounter within last 12 months    Recent Outpatient Visits           11 months ago Type 2 diabetes mellitus with stage 3b chronic kidney disease, with long-term current use of insulin ([place])   [place], Charlane Ferretti, MD   1 year ago Type 2 diabetes mellitus with stage 3b chronic kidney disease, with long-term current use of insulin ([place])   [place], Charlane Ferretti, MD   1 year ago Type 2 diabetes mellitus with hyperglycemia, with long-term current use of insulin [facility])   [place] Charlott Rakes, MD   3 years ago Essential hypertension   [place], Charlane Ferretti, MD   4 years ago Type 2 diabetes mellitus with complication, with long-term current use of insulin [facility])   [place] Charlott Rakes, MD       Future Appointments             In 2 months Charlott Rakes, MD [place]

## 2023-01-25 NOTE — Telephone Encounter (Signed)
Already sent to pharmacy today.

## 2023-01-25 NOTE — Telephone Encounter (Signed)
Pt needs refill for pen needles / pt received her insulin medication delivery but doesn't have needles to take insulin / pt scheduled next opening with Dr. Margarita Rana in April / please advise asap   [place]

## 2023-01-27 ENCOUNTER — Other Ambulatory Visit: Payer: Self-pay

## 2023-01-27 MED ORDER — CARVEDILOL 25 MG PO TABS
25.0000 mg | ORAL_TABLET | Freq: Two times a day (BID) | ORAL | 0 refills | Status: DC
  Filled 2023-01-27 – 2023-03-16 (×2): qty 180, 90d supply, fill #0

## 2023-02-01 ENCOUNTER — Other Ambulatory Visit: Payer: Self-pay

## 2023-02-03 ENCOUNTER — Other Ambulatory Visit: Payer: Self-pay

## 2023-02-08 ENCOUNTER — Other Ambulatory Visit: Payer: Self-pay

## 2023-02-08 ENCOUNTER — Telehealth: Payer: Self-pay | Admitting: Family Medicine

## 2023-02-08 NOTE — Telephone Encounter (Signed)
Call placed to patient unable to reach message left on VM. Call to notify patient that insulin needles have been sent to her pharmacy.

## 2023-02-08 NOTE — Telephone Encounter (Signed)
Patient called in to see if her diabetic needles had been approved. Please advise patient.

## 2023-03-16 ENCOUNTER — Other Ambulatory Visit: Payer: Self-pay

## 2023-03-16 ENCOUNTER — Other Ambulatory Visit: Payer: Self-pay | Admitting: Family Medicine

## 2023-03-16 DIAGNOSIS — N1832 Chronic kidney disease, stage 3b: Secondary | ICD-10-CM

## 2023-03-16 DIAGNOSIS — I10 Essential (primary) hypertension: Secondary | ICD-10-CM

## 2023-03-17 ENCOUNTER — Other Ambulatory Visit: Payer: Self-pay

## 2023-03-17 MED ORDER — METFORMIN HCL 1000 MG PO TABS
1000.0000 mg | ORAL_TABLET | Freq: Two times a day (BID) | ORAL | 0 refills | Status: DC
  Filled 2023-03-17: qty 60, 30d supply, fill #0

## 2023-03-17 MED ORDER — LOSARTAN POTASSIUM 100 MG PO TABS
ORAL_TABLET | ORAL | 0 refills | Status: DC
  Filled 2023-03-17: qty 30, 30d supply, fill #0

## 2023-03-17 MED ORDER — AMLODIPINE BESYLATE 10 MG PO TABS
10.0000 mg | ORAL_TABLET | Freq: Every day | ORAL | 0 refills | Status: DC
  Filled 2023-03-17: qty 30, 30d supply, fill #0

## 2023-03-18 ENCOUNTER — Other Ambulatory Visit: Payer: Self-pay

## 2023-04-12 ENCOUNTER — Encounter: Payer: Self-pay | Admitting: Family Medicine

## 2023-04-12 ENCOUNTER — Other Ambulatory Visit: Payer: Self-pay

## 2023-04-12 ENCOUNTER — Ambulatory Visit: Payer: Medicaid Other | Attending: Family Medicine | Admitting: Family Medicine

## 2023-04-12 VITALS — BP 189/80 | HR 70 | Wt 156.8 lb

## 2023-04-12 DIAGNOSIS — Z794 Long term (current) use of insulin: Secondary | ICD-10-CM | POA: Diagnosis not present

## 2023-04-12 DIAGNOSIS — E1122 Type 2 diabetes mellitus with diabetic chronic kidney disease: Secondary | ICD-10-CM

## 2023-04-12 DIAGNOSIS — E118 Type 2 diabetes mellitus with unspecified complications: Secondary | ICD-10-CM | POA: Diagnosis not present

## 2023-04-12 DIAGNOSIS — Z1231 Encounter for screening mammogram for malignant neoplasm of breast: Secondary | ICD-10-CM

## 2023-04-12 DIAGNOSIS — E1165 Type 2 diabetes mellitus with hyperglycemia: Secondary | ICD-10-CM

## 2023-04-12 DIAGNOSIS — E2839 Other primary ovarian failure: Secondary | ICD-10-CM

## 2023-04-12 DIAGNOSIS — N1832 Chronic kidney disease, stage 3b: Secondary | ICD-10-CM

## 2023-04-12 DIAGNOSIS — Z1211 Encounter for screening for malignant neoplasm of colon: Secondary | ICD-10-CM | POA: Diagnosis not present

## 2023-04-12 DIAGNOSIS — Z8673 Personal history of transient ischemic attack (TIA), and cerebral infarction without residual deficits: Secondary | ICD-10-CM

## 2023-04-12 DIAGNOSIS — I10 Essential (primary) hypertension: Secondary | ICD-10-CM

## 2023-04-12 LAB — POCT GLYCOSYLATED HEMOGLOBIN (HGB A1C): HbA1c, POC (controlled diabetic range): 8 % — AB (ref 0.0–7.0)

## 2023-04-12 LAB — GLUCOSE, POCT (MANUAL RESULT ENTRY): POC Glucose: 224 mg/dl — AB (ref 70–99)

## 2023-04-12 MED ORDER — ATORVASTATIN CALCIUM 80 MG PO TABS
80.0000 mg | ORAL_TABLET | Freq: Every day | ORAL | 1 refills | Status: DC
  Filled 2023-04-12: qty 90, 90d supply, fill #0

## 2023-04-12 MED ORDER — GLIMEPIRIDE 4 MG PO TABS
4.0000 mg | ORAL_TABLET | Freq: Every day | ORAL | 1 refills | Status: DC
  Filled 2023-04-12: qty 90, 90d supply, fill #0

## 2023-04-12 MED ORDER — AMLODIPINE BESYLATE 10 MG PO TABS
10.0000 mg | ORAL_TABLET | Freq: Every day | ORAL | 1 refills | Status: DC
  Filled 2023-04-12: qty 90, 90d supply, fill #0

## 2023-04-12 MED ORDER — CLOPIDOGREL BISULFATE 75 MG PO TABS
75.0000 mg | ORAL_TABLET | Freq: Every day | ORAL | 1 refills | Status: DC
  Filled 2023-04-12: qty 90, 90d supply, fill #0

## 2023-04-12 MED ORDER — CARVEDILOL 25 MG PO TABS
25.0000 mg | ORAL_TABLET | Freq: Two times a day (BID) | ORAL | 1 refills | Status: DC
  Filled 2023-04-12: qty 180, 90d supply, fill #0

## 2023-04-12 MED ORDER — VALSARTAN 320 MG PO TABS
320.0000 mg | ORAL_TABLET | Freq: Every day | ORAL | 1 refills | Status: DC
  Filled 2023-04-12: qty 90, 90d supply, fill #0

## 2023-04-12 MED ORDER — HUMULIN 70/30 KWIKPEN (70-30) 100 UNIT/ML ~~LOC~~ SUPN
PEN_INJECTOR | SUBCUTANEOUS | 6 refills | Status: DC
  Filled 2023-04-12: qty 90, 15d supply, fill #0
  Filled 2023-04-13: qty 15, 27d supply, fill #0

## 2023-04-12 MED ORDER — METFORMIN HCL 1000 MG PO TABS
1000.0000 mg | ORAL_TABLET | Freq: Two times a day (BID) | ORAL | 1 refills | Status: DC
  Filled 2023-04-12: qty 180, 90d supply, fill #0

## 2023-04-12 NOTE — Progress Notes (Signed)
Subjective:  Patient ID: Samantha [name], female    DOB: [birth year]  Age: 72 y.o. MRN: [id number]  CC: Hypertension and Diabetes   HPI Samantha [name] is a 72 y.o. year old female with a history of hypertension, type 2 diabetes mellitus (A1c 8.0), hyperlipidemia, depression, peripheral vascular disease.   Interval History:  Her BP has been elevated at home despite medication adherence.  A1c is 8.0 down from 9.1 and she endorses that her is with her medications.  She has experienced some hypoglycemic episodes when she skipped her meals but other than that has been doing good. She endorses presence of blurry vision but was informed she had cataracts at her last eye appointment. Endorses adherence with her statin and Plavix as well. She denies presence of additional concerns.  Past Medical History:  Diagnosis Date   Acute pyelonephritis    Bacteremia, escherichia coli 01/12/2015   Chest pain    Critical lower limb ischemia    Hyperlipidemia    Hypertension    Left carotid bruit    PAD (peripheral artery disease)    Type II diabetes mellitus     Past Surgical History:  Procedure Laterality Date   ABDOMINAL ANGIOGRAM  05/16/2015   Procedure: Abdominal Angiogram;  Surgeon: Runell Gess, MD;  Location: [facility];  Service: Cardiovascular;;   ABDOMINAL HYSTERECTOMY  ~ 2000   PERIPHERAL VASCULAR CATHETERIZATION Bilateral 05/16/2015   Procedure: Lower Extremity Angiography;  Surgeon: Runell Gess, MD; renal arteries widely patent, L-SFA 75%, L-pop 95%, L-peroneal 90%; R-SFA 40%, R-pop 50%, R-prox peroneal 90%; directional atherectomy L-SFA, p-pop, drug-eluting balloon angioplasty reducing the stenoses to 0, small linear dissection was not flow limiting       PERIPHERAL VASCULAR CATHETERIZATION Left 05/16/2015   Procedure: Peripheral Vascular Atherectomy;  Surgeon: Runell Gess, MD;  Location: [facility];  Service: Cardiovascular;  Laterality: Left;  sfa     Family History  Problem Relation Age of Onset   Hypertension Mother     Social History   Socioeconomic History   Marital status: Widowed    Spouse name: Not on file   Number of children: Not on file   Years of education: Not on file   Highest education level: Not on file  Occupational History   Not on file  Tobacco Use   Smoking status: Never   Smokeless tobacco: Never  Substance and Sexual Activity   Alcohol use: Yes    Alcohol/week: 1.0 - 2.0 standard drink of alcohol    Types: 1 - 2 Cans of beer per week    Comment: occas   Drug use: No   Sexual activity: Never  Other Topics Concern   Not on file  Social History Narrative   Not on file   Social Determinants of Health   Financial Resource Strain: Not on file  Food Insecurity: Not on file  Transportation Needs: Not on file  Physical Activity: Not on file  Stress: Not on file  Social Connections: Not on file    Allergies  Allergen Reactions   Perflutren Other (See Comments)    Pt with back pain as soon as administered.    Outpatient Medications Prior to Visit  Medication Sig Dispense Refill   Blood Glucose Monitoring Suppl (TRUE METRIX METER) DEVI 1 each by Does not apply route 3 (three) times daily before meals. 1 Device 0   glucose blood test strip Use 3 times daily before meals 100 each 12  Insulin Pen Needle (TRUEPLUS PEN NEEDLES) 31G X 6 MM MISC Inject 1 each into the skin 2 (two) times daily. 100 each 0   Insulin Syringe-Needle U-100 (BD INSULIN SYRINGE ULTRAFINE) 31G X 15/64" 0.5 ML MISC Use subcutaneously twice daily 60 each 12   TRUEPLUS LANCETS 28G MISC 1 each by Does not apply route 3 (three) times daily before meals. 100 each 12   amLODipine (NORVASC) 10 MG tablet Take 1 tablet (10 mg total) by mouth daily. 30 tablet 0   atorvastatin (LIPITOR) 80 MG tablet Take 1 tablet (80 mg total) by mouth daily. 30 tablet 3   carvedilol (COREG) 25 MG tablet Take 1 tablet (25 mg total) by mouth 2 (two)  times daily with a meal. 180 tablet 0   clopidogrel (PLAVIX) 75 MG tablet Take 1 tablet (75 mg total) by mouth daily. 30 tablet 3   glimepiride (AMARYL) 4 MG tablet Take 1 tablet (4 mg total) by mouth daily with breakfast. 90 tablet 1   insulin isophane & regular human KwikPen (HUMULIN 70/30 KWIKPEN) (70-30) 100 UNIT/ML KwikPen Inject subcutaneously twice daily 30 units in the morning and 25 units in the evening 90 mL 6   losartan (COZAAR) 100 MG tablet TAKE 1 TABLET BY MOUTH DAILY. 30 tablet 0   metFORMIN (GLUCOPHAGE) 1000 MG tablet Take 1 tablet (1,000 mg total) by mouth 2 (two) times daily with a meal. 60 tablet 0   No facility-administered medications prior to visit.     ROS Review of Systems  Constitutional:  Negative for activity change and appetite change.  HENT:  Negative for sinus pressure and sore throat.   Respiratory:  Negative for chest tightness, shortness of breath and wheezing.   Cardiovascular:  Negative for chest pain and palpitations.  Gastrointestinal:  Negative for abdominal distention, abdominal pain and constipation.  Genitourinary: Negative.   Musculoskeletal: Negative.   Psychiatric/Behavioral:  Negative for behavioral problems and dysphoric mood.     Objective:  BP (!) 189/80 (BP Location: Right Arm, Patient Position: Sitting, Cuff Size: Normal)   Pulse 70   Wt 156 lb 12.8 oz (71.1 kg)   SpO2 94%   BMI 26.09 kg/m      04/12/2023    4:18 PM 06/18/2022    3:44 PM 06/18/2022    3:22 PM  BP/Weight  Systolic BP 189 164 199  Diastolic BP 80 86 89  Wt. (Lbs) 156.8  161  BMI 26.09 kg/m2  26.79 kg/m2      Physical Exam Constitutional:      Appearance: She is well-developed.  Cardiovascular:     Rate and Rhythm: Normal rate.     Heart sounds: Normal heart sounds. No murmur heard. Pulmonary:     Effort: Pulmonary effort is normal.     Breath sounds: Normal breath sounds. No wheezing or rales.  Chest:     Chest wall: No tenderness.  Abdominal:      General: Bowel sounds are normal. There is no distension.     Palpations: Abdomen is soft. There is no mass.     Tenderness: There is no abdominal tenderness.  Musculoskeletal:        General: Normal range of motion.     Right lower leg: No edema.     Left lower leg: No edema.  Neurological:     Mental Status: She is alert and oriented to person, place, and time.  Psychiatric:        Mood and Affect: Mood normal.  Latest Ref Rng & Units 11/19/2021    4:49 PM 11/10/2021    3:30 AM 11/08/2021   11:19 AM  CMP  Glucose 70 - 99 mg/dL 161  096  045   BUN 8 - 27 mg/dL 23  22  22    Creatinine 0.57 - 1.00 mg/dL 4.09  8.11  9.14   Sodium 134 - 144 mmol/L 140  136  141   Potassium 3.5 - 5.2 mmol/L 4.6  4.0  3.5   Chloride 96 - 106 mmol/L 103  107  107   CO2 20 - 29 mmol/L 21  21    Calcium 8.7 - 10.3 mg/dL 9.5  8.8    Total Protein 6.0 - 8.5 g/dL 6.9     Total Bilirubin 0.0 - 1.2 mg/dL 0.3     Alkaline Phos 44 - 121 IU/L 149     AST 0 - 40 IU/L 17     ALT 0 - 32 IU/L 18       Lipid Panel     Component Value Date/Time   CHOL 134 11/09/2021 0257   CHOL 139 09/13/2018 1002   TRIG 168 (H) 11/09/2021 0257   HDL 33 (L) 11/09/2021 0257   HDL 45 09/13/2018 1002   CHOLHDL 4.1 11/09/2021 0257   VLDL 34 11/09/2021 0257   LDLCALC 67 11/09/2021 0257   LDLCALC 74 09/13/2018 1002    CBC    Component Value Date/Time   WBC 9.3 11/10/2021 0330   RBC 3.89 11/10/2021 0330   HGB 12.5 11/10/2021 0330   HCT 36.2 11/10/2021 0330   PLT 327 11/10/2021 0330   MCV 93.1 11/10/2021 0330   MCH 32.1 11/10/2021 0330   MCHC 34.5 11/10/2021 0330   RDW 12.0 11/10/2021 0330   LYMPHSABS 1.4 11/08/2021 0935   MONOABS 0.5 11/08/2021 0935   EOSABS 0.3 11/08/2021 0935   BASOSABS 0.1 11/08/2021 0935    Lab Results  Component Value Date   HGBA1C 8.0 (A) 04/12/2023    Assessment & Plan:  1. Type 2 diabetes mellitus with complication, with long-term current use of insulin A1c of 8 which is  slightly above goal of less than 7 Due to some episodes of hypoglycemia I will hold off on adjusting her regimen Advised to avoid skipping meals and eat snacks during the day Counseled on Diabetic diet, my plate method, 782 minutes of moderate intensity exercise/week Blood sugar logs with fasting goals of 80-120 mg/dl, random of less than 956 and in the event of sugars less than 60 mg/dl or greater than 213 mg/dl encouraged to notify the clinic. Advised on the need for annual eye exams, annual foot exams, Pneumonia vaccine. - POCT glucose (manual entry) - POCT glycosylated hemoglobin (Hb A1C) - CMP14+EGFR - Microalbumin/Creatinine Ratio, Urine - glimepiride (AMARYL) 4 MG tablet; Take 1 tablet (4 mg total) by mouth daily with breakfast.  Dispense: 90 tablet; Refill: 1  2. Encounter for screening mammogram for malignant neoplasm of breast - MS 3D SCR MAMMO BILAT BR (aka MM); Future  3. Estrogen deficiency - DG Bone Density; Future  4. Screening for colon cancer - Fecal occult blood, imunochemical  5. Accelerated hypertension Uncontrolled Losartan substituted with Diovan Counseled on blood pressure goal of less than 130/80, low-sodium, DASH diet, medication compliance, 150 minutes of moderate intensity exercise per week. Discussed medication compliance, adverse effects. - valsartan (DIOVAN) 320 MG tablet; Take 1 tablet (320 mg total) by mouth daily.  Dispense: 90 tablet; Refill: 1 - amLODipine (  NORVASC) 10 MG tablet; Take 1 tablet (10 mg total) by mouth daily.  Dispense: 90 tablet; Refill: 1 - carvedilol (COREG) 25 MG tablet; Take 1 tablet (25 mg total) by mouth 2 (two) times daily with a meal.  Dispense: 180 tablet; Refill: 1  6. Type 2 diabetes mellitus with hyperglycemia, with long-term current use of insulin See #1 above - insulin isophane & regular human KwikPen (HUMULIN 70/30 KWIKPEN) (70-30) 100 UNIT/ML KwikPen; Inject subcutaneously twice daily 30 units in the morning and 25  units in the evening  Dispense: 90 mL; Refill: 6  7. Type 2 diabetes mellitus with stage 3b chronic kidney disease, with long-term current use of insulin Will check renal function and adjust metformin dose if EGFR is low - metFORMIN (GLUCOPHAGE) 1000 MG tablet; Take 1 tablet (1,000 mg total) by mouth 2 (two) times daily with a meal.  Dispense: 180 tablet; Refill: 1  8. History of stroke Secondary risk factor modification - clopidogrel (PLAVIX) 75 MG tablet; Take 1 tablet (75 mg total) by mouth daily.  Dispense: 90 tablet; Refill: 1    Meds ordered this encounter  Medications   valsartan (DIOVAN) 320 MG tablet    Sig: Take 1 tablet (320 mg total) by mouth daily.    Dispense:  90 tablet    Refill:  1    Discontinue Losartan   amLODipine (NORVASC) 10 MG tablet    Sig: Take 1 tablet (10 mg total) by mouth daily.    Dispense:  90 tablet    Refill:  1   atorvastatin (LIPITOR) 80 MG tablet    Sig: Take 1 tablet (80 mg total) by mouth daily.    Dispense:  90 tablet    Refill:  1   carvedilol (COREG) 25 MG tablet    Sig: Take 1 tablet (25 mg total) by mouth 2 (two) times daily with a meal.    Dispense:  180 tablet    Refill:  1   clopidogrel (PLAVIX) 75 MG tablet    Sig: Take 1 tablet (75 mg total) by mouth daily.    Dispense:  90 tablet    Refill:  1   glimepiride (AMARYL) 4 MG tablet    Sig: Take 1 tablet (4 mg total) by mouth daily with breakfast.    Dispense:  90 tablet    Refill:  1   insulin isophane & regular human KwikPen (HUMULIN 70/30 KWIKPEN) (70-30) 100 UNIT/ML KwikPen    Sig: Inject subcutaneously twice daily 30 units in the morning and 25 units in the evening    Dispense:  90 mL    Refill:  6   metFORMIN (GLUCOPHAGE) 1000 MG tablet    Sig: Take 1 tablet (1,000 mg total) by mouth 2 (two) times daily with a meal.    Dispense:  180 tablet    Refill:  1    Follow-up: Return in about 1 month (around 05/12/2023) for Chronic medical conditions, Blood Pressure follow-up.        Hoy Register, MD, FAAFP. [facility] [place], [place] [phone]   04/12/2023, 5:52 PM

## 2023-04-12 NOTE — Patient Instructions (Signed)
Cmo controlar su hipertensin Managing Your Hypertension La hipertensin, tambin conocida como presin arterial alta, se produce cuando la sangre ejerce presin contra las paredes de las arterias con demasiada fuerza. Las arterias son los vasos sanguneos que transportan la sangre desde el corazn hacia todas las partes del cuerpo. La hipertensin hace que el corazn haga ms esfuerzo para bombear la sangre y puede provocar que las arterias se estrechen o endurezcan. Qu significan las lecturas de la presin arterial Una lectura de la presin arterial consta de un nmero ms alto sobre un nmero ms bajo. El primer nmero, o nmero superior, es la presin sistlica. Es la medida de la presin de las arterias cuando el corazn late. El segundo nmero, o nmero inferior, es la presin diastlica. Es la medida de la presin en las arterias cuando el corazn se relaja. Para la mayora de las personas, una presin arterial normal est por debajo de 120/80. La presin arterial deseada puede variar en funcin de las enfermedades, la edad y otros factores personales. La presin arterial se clasifica en cuatro etapas. Sobre la base de la lectura de su presin arterial, el mdico puede usar las siguientes etapas para determinar si necesita tratamiento y de qu tipo. La presin sistlica y la presin diastlica se miden en una unidad llamada milmetros de mercurio (mm Hg). Normal Presin sistlica: por debajo de 120. Presin diastlica: por debajo de 80. Elevada Presin sistlica: 120-129. Presin diastlica: por debajo de 80. Etapa 1 de hipertensin Presin sistlica: 130-139. Presin diastlica: 80-89. Etapa 2 de hipertensin Presin sistlica: 140 o ms. Presin diastlica: 90 o ms. Cmo puede afectarme esta enfermedad? Controlar la hipertensin es muy importante. Con el transcurso del tiempo, la hipertensin puede daar las arterias y disminuir el flujo de sangre hacia partes del cuerpo que  incluyen el cerebro, el corazn y los riones. Tener hipertensin no tratada o no controlada puede causar: Infarto de miocardio. Accidente cerebrovascular. Debilitamiento de los vasos sanguneos (aneurisma). Insuficiencia cardaca. Dao renal. Dao ocular. Problemas de memoria y concentracin. Demencia vascular. Qu medidas puedo tomar para controlar esta afeccin? La hipertensin se puede controlar haciendo cambios en el estilo de vida y, posiblemente, tomando medicamentos. Su mdico le ayudar a crear un plan para bajar la presin arterial al rango normal. Es posible que lo deriven para que reciba asesoramiento sobre una dieta saludable y actividad fsica. Nutricin  Siga una dieta con alto contenido de fibras y potasio, y con bajo contenido de sal (sodio), azcar agregada y grasas. Un ejemplo de plan de alimentacin se denomina dieta DASH. DASH es la sigla en ingls de "Enfoques alimentarios para detener la hipertensin". Para alimentarse de esta manera: Coma mucha fruta y verdura fresca. Trate de que la mitad del plato de cada comida sea de frutas y verduras. Coma cereales integrales, como pasta integral, arroz integral o pan integral. Llene aproximadamente un cuarto del plato con cereales integrales. Consuma productos lcteos descremados. Evite la ingesta de cortes de carne grasa, carne procesada o curada, y carne de ave con piel. Llene aproximadamente un cuarto del plato con protenas magras, como pescado, pollo sin piel, frijoles, huevos o tofu. Evite ingerir alimentos prehechos y procesados. En general, estos tienen mayor cantidad de sodio, azcar agregada y grasa. Reduzca su ingesta diaria de sodio. Muchas personas que tienen hipertensin deben comer menos de 1500 mg de sodio por da. Estilo de vida  Trabaje con su mdico para mantener un peso saludable o perder peso. Pregntele cul es el peso recomendado   para usted. Realice al menos 30 minutos de ejercicio que haga que se acelere  su corazn (ejercicio aerbico) la mayora de los das de la semana. Estas actividades pueden incluir caminar, nadar o andar en bicicleta. Incluya ejercicios para fortalecer sus msculos (ejercicios de resistencia), como levantamiento de pesas, como parte de su rutina semanal de ejercicios. Intente realizar 30 minutos de este tipo de ejercicios al menos tres das a la semana. No consuma ningn producto que contenga nicotina o tabaco. Estos productos incluyen cigarrillos, tabaco para mascar y aparatos de vapeo, como los cigarrillos electrnicos. Si necesita ayuda para dejar de consumir estos productos, consulte al mdico. Controle las enfermedades a largo plazo (crnicas), como el colesterol alto o la diabetes. Identifique sus causas de estrs y encuentre maneras de controlar el estrs. Esto puede incluir meditacin, respiracin profunda o hacerse tiempo para realizar actividades divertidas. Consumo de alcohol No beba alcohol si: Su mdico le indica no hacerlo. Est embarazada, puede estar embarazada o est tratando de quedar embarazada. Si bebe alcohol: Limite la cantidad que bebe a lo siguiente: De 0 a 1 medida por da para las mujeres. De 0 a 2 medidas por da para los hombres. Sepa cunta cantidad de alcohol hay en las bebidas que toma. En los [place], una medida equivale a una botella de cerveza de 12 oz (355 ml), un vaso de vino de 5 oz (148 ml) o un vaso de una bebida alcohlica de alta graduacin de 1 oz (44 ml). Medicamentos El mdico puede recetarle medicamentos si los cambios en el estilo de vida no son suficientes para lograr controlar la presin arterial y si: Su presin arterial sistlica es de 130 o ms. Su presin arterial diastlica es de 80 o ms. Use los medicamentos solamente como se lo haya indicado el mdico. Siga cuidadosamente las indicaciones. Los medicamentos para la presin arterial deben tomarse como se lo haya indicado el mdico. Los medicamentos pierden eficacia  al omitir las dosis. El hecho de omitir las dosis tambin aumenta el riesgo de otros problemas. Monitoreo Antes de controlarse la presin arterial: No fume, no consuma bebidas con cafena ni haga ejercicio dentro de los 30 minutos antes de tomar la medicin. Vaya al bao y vace la vejiga (orine). Permanezca sentado tranquilamente durante al menos 5 minutos antes de tomar las mediciones. Contrlese la presin arterial en su casa segn las indicaciones del mdico. Para hacer esto: Sintese con la espalda recta y con apoyo. Coloque los pies planos en el piso. No se cruce de piernas. Apoye el brazo sobre una superficie plana, como una mesa. Asegrese de que la parte superior del brazo est al nivel del corazn. Cada vez que tome una medicin, tome dos o tres lecturas con un minuto de separacin y anote los resultados. Posiblemente tambin sea necesario que el mdico le controle la presin arterial de manera regular. Informacin general Hable con su mdico acerca de la dieta, hbitos de ejercicio y otros factores del estilo de vida que pueden contribuir a la hipertensin. Revise con su mdico todos los medicamentos que toma ya que puede haber efectos secundarios o interacciones. Concurra a todas las visitas de seguimiento. El mdico puede ayudarle a crear y ajustar su plan para controlar la presin arterial alta. Dnde obtener ms informacin National Heart, Lung, and Blood Institute (Instituto Nacional del Corazn, los Pulmones y la Sangre): www.nhlbi.nih.gov American Heart Association (Asociacin Estadounidense del Corazn): www.heart.org Comunquese con un mdico si: Piensa que tiene una reaccin alrgica a los medicamentos   que ha tomado. Tiene dolores de cabeza frecuentes (recurrentes). Siente mareos. Tiene hinchazn en los tobillos. Tiene problemas de visin. Solicite ayuda de inmediato si: Siente un dolor de cabeza intenso o confusin. Siente debilidad inusual, adormecimiento o que se  desmayar. Siente un dolor intenso en el pecho o el abdomen. Vomita repetidas veces. Tiene dificultad para respirar. Estos sntomas pueden indicar una emergencia. Solicite ayuda de inmediato. Llame al 911. No espere a ver si los sntomas desaparecen. No conduzca por sus propios medios hasta el hospital. Resumen La hipertensin se produce cuando la sangre bombea en las arterias con mucha fuerza. Si esta afeccin no se controla, podra correr riesgo de tener complicaciones graves. La presin arterial deseada puede variar en funcin de las enfermedades, la edad y otros factores personales. Para la mayora de las personas, una presin arterial normal es menor que 120/80. La hipertensin se puede controlar mediante cambios en el estilo de vida, tomando medicamentos, o ambas cosas. Los cambios en el estilo de vida para ayudar a controlar la hipertensin incluyen prdida de peso, seguir una dieta saludable, con bajo contenido de sodio, hacer ms ejercicio, dejar de fumar y limitar el consumo de alcohol. Esta informacin no tiene como fin reemplazar el consejo del mdico. Asegrese de hacerle al mdico cualquier pregunta que tenga. Document Revised: 09/22/2021 Document Reviewed: 09/22/2021 Elsevier Patient Education  2023 [employer]vier Inc.  

## 2023-04-13 ENCOUNTER — Other Ambulatory Visit: Payer: Self-pay

## 2023-04-13 LAB — CMP14+EGFR
ALT: 14 IU/L (ref 0–32)
AST: 16 IU/L (ref 0–40)
Albumin/Globulin Ratio: 1.3 (ref 1.2–2.2)
Albumin: 3.9 g/dL (ref 3.9–4.9)
Alkaline Phosphatase: 108 IU/L (ref 44–121)
BUN/Creatinine Ratio: 15 (ref 12–28)
BUN: 39 mg/dL — ABNORMAL HIGH (ref 8–27)
Bilirubin Total: <0.2 mg/dL (ref 0.0–1.2)
CO2: 19 mmol/L — ABNORMAL LOW (ref 20–29)
Calcium: 9.3 mg/dL (ref 8.7–10.3)
Chloride: 103 mmol/L (ref 96–106)
Creatinine, Ser: 2.56 mg/dL — ABNORMAL HIGH (ref 0.57–1.00)
Globulin, Total: 2.9 g/dL (ref 1.5–4.5)
Glucose: 246 mg/dL — ABNORMAL HIGH (ref 70–99)
Potassium: 4.6 mmol/L (ref 3.5–5.2)
Sodium: 140 mmol/L (ref 134–144)
Total Protein: 6.8 g/dL (ref 6.0–8.5)
eGFR: 20 mL/min/{1.73_m2} — ABNORMAL LOW (ref >59)

## 2023-04-14 ENCOUNTER — Emergency Department (HOSPITAL_COMMUNITY): Payer: Medicaid Other

## 2023-04-14 ENCOUNTER — Inpatient Hospital Stay (HOSPITAL_COMMUNITY)
Admission: EM | Admit: 2023-04-14 | Discharge: 2023-04-18 | DRG: 689 | Disposition: A | Discharge status: Home / Self Care | Payer: Medicaid Other | Attending: Internal Medicine | Admitting: Internal Medicine

## 2023-04-14 ENCOUNTER — Encounter (HOSPITAL_COMMUNITY): Payer: Self-pay | Admitting: Pharmacy Technician

## 2023-04-14 ENCOUNTER — Other Ambulatory Visit: Payer: Self-pay

## 2023-04-14 DIAGNOSIS — N184 Chronic kidney disease, stage 4 (severe): Secondary | ICD-10-CM | POA: Diagnosis present

## 2023-04-14 DIAGNOSIS — E11649 Type 2 diabetes mellitus with hypoglycemia without coma: Secondary | ICD-10-CM | POA: Diagnosis present

## 2023-04-14 DIAGNOSIS — Z1152 Encounter for screening for COVID-19: Secondary | ICD-10-CM | POA: Diagnosis not present

## 2023-04-14 DIAGNOSIS — E1140 Type 2 diabetes mellitus with diabetic neuropathy, unspecified: Secondary | ICD-10-CM | POA: Diagnosis present

## 2023-04-14 DIAGNOSIS — Z7984 Long term (current) use of oral hypoglycemic drugs: Secondary | ICD-10-CM

## 2023-04-14 DIAGNOSIS — A419 Sepsis, unspecified organism: Secondary | ICD-10-CM

## 2023-04-14 DIAGNOSIS — G9341 Metabolic encephalopathy: Secondary | ICD-10-CM

## 2023-04-14 DIAGNOSIS — Z8249 Family history of ischemic heart disease and other diseases of the circulatory system: Secondary | ICD-10-CM | POA: Diagnosis not present

## 2023-04-14 DIAGNOSIS — Z90710 Acquired absence of both cervix and uterus: Secondary | ICD-10-CM | POA: Diagnosis not present

## 2023-04-14 DIAGNOSIS — N390 Urinary tract infection, site not specified: Secondary | ICD-10-CM | POA: Diagnosis not present

## 2023-04-14 DIAGNOSIS — Z794 Long term (current) use of insulin: Secondary | ICD-10-CM | POA: Diagnosis not present

## 2023-04-14 DIAGNOSIS — E1151 Type 2 diabetes mellitus with diabetic peripheral angiopathy without gangrene: Secondary | ICD-10-CM | POA: Diagnosis present

## 2023-04-14 DIAGNOSIS — Z79899 Other long term (current) drug therapy: Secondary | ICD-10-CM

## 2023-04-14 DIAGNOSIS — I129 Hypertensive chronic kidney disease with stage 1 through stage 4 chronic kidney disease, or unspecified chronic kidney disease: Secondary | ICD-10-CM | POA: Diagnosis present

## 2023-04-14 DIAGNOSIS — I10 Essential (primary) hypertension: Secondary | ICD-10-CM | POA: Diagnosis present

## 2023-04-14 DIAGNOSIS — N179 Acute kidney failure, unspecified: Secondary | ICD-10-CM | POA: Diagnosis present

## 2023-04-14 DIAGNOSIS — E1149 Type 2 diabetes mellitus with other diabetic neurological complication: Secondary | ICD-10-CM

## 2023-04-14 DIAGNOSIS — E118 Type 2 diabetes mellitus with unspecified complications: Secondary | ICD-10-CM | POA: Diagnosis not present

## 2023-04-14 DIAGNOSIS — J810 Acute pulmonary edema: Secondary | ICD-10-CM | POA: Diagnosis present

## 2023-04-14 DIAGNOSIS — E1122 Type 2 diabetes mellitus with diabetic chronic kidney disease: Secondary | ICD-10-CM | POA: Diagnosis present

## 2023-04-14 DIAGNOSIS — B9620 Unspecified Escherichia coli [E. coli] as the cause of diseases classified elsewhere: Secondary | ICD-10-CM | POA: Diagnosis present

## 2023-04-14 DIAGNOSIS — E162 Hypoglycemia, unspecified: Secondary | ICD-10-CM | POA: Diagnosis not present

## 2023-04-14 DIAGNOSIS — Z888 Allergy status to other drugs, medicaments and biological substances status: Secondary | ICD-10-CM

## 2023-04-14 DIAGNOSIS — F419 Anxiety disorder, unspecified: Secondary | ICD-10-CM | POA: Diagnosis present

## 2023-04-14 DIAGNOSIS — F32A Depression, unspecified: Secondary | ICD-10-CM | POA: Diagnosis present

## 2023-04-14 DIAGNOSIS — Z8673 Personal history of transient ischemic attack (TIA), and cerebral infarction without residual deficits: Secondary | ICD-10-CM | POA: Diagnosis not present

## 2023-04-14 DIAGNOSIS — Z7902 Long term (current) use of antithrombotics/antiplatelets: Secondary | ICD-10-CM | POA: Diagnosis not present

## 2023-04-14 DIAGNOSIS — N12 Tubulo-interstitial nephritis, not specified as acute or chronic: Secondary | ICD-10-CM | POA: Diagnosis present

## 2023-04-14 DIAGNOSIS — R55 Syncope and collapse: Secondary | ICD-10-CM | POA: Diagnosis present

## 2023-04-14 DIAGNOSIS — E785 Hyperlipidemia, unspecified: Secondary | ICD-10-CM | POA: Diagnosis present

## 2023-04-14 DIAGNOSIS — R6520 Severe sepsis without septic shock: Secondary | ICD-10-CM | POA: Diagnosis present

## 2023-04-14 DIAGNOSIS — J40 Bronchitis, not specified as acute or chronic: Secondary | ICD-10-CM | POA: Diagnosis present

## 2023-04-14 DIAGNOSIS — E860 Dehydration: Secondary | ICD-10-CM | POA: Diagnosis present

## 2023-04-14 LAB — CBC WITH DIFFERENTIAL/PLATELET
Abs Immature Granulocytes: 0.05 10*3/uL (ref 0.00–0.07)
Basophils Absolute: 0.1 10*3/uL (ref 0.0–0.1)
Basophils Relative: 0 %
Eosinophils Absolute: 0 10*3/uL (ref 0.0–0.5)
Eosinophils Relative: 0 %
HCT: 36.3 % (ref 36.0–46.0)
Hemoglobin: 12.1 g/dL (ref 12.0–15.0)
Immature Granulocytes: 0 %
Lymphocytes Relative: 7 %
Lymphs Abs: 0.8 10*3/uL (ref 0.7–4.0)
MCH: 31.1 pg (ref 26.0–34.0)
MCHC: 33.3 g/dL (ref 30.0–36.0)
MCV: 93.3 fL (ref 80.0–100.0)
Monocytes Absolute: 1.1 10*3/uL — ABNORMAL HIGH (ref 0.1–1.0)
Monocytes Relative: 9 %
Neutro Abs: 10 10*3/uL — ABNORMAL HIGH (ref 1.7–7.7)
Neutrophils Relative %: 84 %
Platelets: 324 10*3/uL (ref 150–400)
RBC: 3.89 MIL/uL (ref 3.87–5.11)
RDW: 12.6 % (ref 11.5–15.5)
WBC: 12 10*3/uL — ABNORMAL HIGH (ref 4.0–10.5)
nRBC: 0 % (ref 0.0–0.2)

## 2023-04-14 LAB — URINALYSIS, ROUTINE W REFLEX MICROSCOPIC
Bilirubin Urine: NEGATIVE
Glucose, UA: NEGATIVE mg/dL
Ketones, ur: NEGATIVE mg/dL
Nitrite: NEGATIVE
Protein, ur: >=300 mg/dL — AB
Specific Gravity, Urine: 1.01 (ref 1.005–1.030)
pH: 5 (ref 5.0–8.0)

## 2023-04-14 LAB — COMPREHENSIVE METABOLIC PANEL
ALT: 11 U/L (ref 0–44)
AST: 18 U/L (ref 15–41)
Albumin: 3.4 g/dL — ABNORMAL LOW (ref 3.5–5.0)
Alkaline Phosphatase: 62 U/L (ref 38–126)
Anion gap: 10 (ref 5–15)
BUN: 36 mg/dL — ABNORMAL HIGH (ref 8–23)
CO2: 20 mmol/L — ABNORMAL LOW (ref 22–32)
Calcium: 8.4 mg/dL — ABNORMAL LOW (ref 8.9–10.3)
Chloride: 106 mmol/L (ref 98–111)
Creatinine, Ser: 2.88 mg/dL — ABNORMAL HIGH (ref 0.44–1.00)
GFR, Estimated: 17 mL/min — ABNORMAL LOW (ref >60)
Glucose, Bld: 95 mg/dL (ref 70–99)
Potassium: 3.6 mmol/L (ref 3.5–5.1)
Sodium: 136 mmol/L (ref 135–145)
Total Bilirubin: 0.7 mg/dL (ref 0.3–1.2)
Total Protein: 6.9 g/dL (ref 6.5–8.1)

## 2023-04-14 LAB — BLOOD GAS, VENOUS
Acid-base deficit: 6 mmol/L — ABNORMAL HIGH (ref 0.0–2.0)
Bicarbonate: 19.6 mmol/L — ABNORMAL LOW (ref 20.0–28.0)
O2 Saturation: 72.5 %
Patient temperature: 37
pCO2, Ven: 38 mmHg — ABNORMAL LOW (ref 44–60)
pH, Ven: 7.32 (ref 7.25–7.43)
pO2, Ven: 42 mmHg (ref 32–45)

## 2023-04-14 LAB — RESP PANEL BY RT-PCR (RSV, FLU A&B, COVID)  RVPGX2
Influenza A by PCR: NEGATIVE
Influenza B by PCR: NEGATIVE
Resp Syncytial Virus by PCR: NEGATIVE
SARS Coronavirus 2 by RT PCR: NEGATIVE

## 2023-04-14 LAB — CBG MONITORING, ED
Glucose-Capillary: 93 mg/dL (ref 70–99)
Glucose-Capillary: 71 mg/dL (ref 70–99)
Glucose-Capillary: 79 mg/dL (ref 70–99)
Glucose-Capillary: 88 mg/dL (ref 70–99)

## 2023-04-14 LAB — TROPONIN I (HIGH SENSITIVITY)
Troponin I (High Sensitivity): 16 ng/L (ref <18)
Troponin I (High Sensitivity): 15 ng/L (ref <18)

## 2023-04-14 LAB — BRAIN NATRIURETIC PEPTIDE: B Natriuretic Peptide: 319.2 pg/mL — ABNORMAL HIGH (ref 0.0–100.0)

## 2023-04-14 LAB — MICROALBUMIN / CREATININE URINE RATIO
Creatinine, Urine: 154.9 mg/dL
Microalb/Creat Ratio: 1814 mg/g creat — ABNORMAL HIGH (ref 0–29)
Microalbumin, Urine: 2809.9 ug/mL

## 2023-04-14 LAB — LIPASE, BLOOD: Lipase: 34 U/L (ref 11–51)

## 2023-04-14 LAB — GLUCOSE, CAPILLARY: Glucose-Capillary: 84 mg/dL (ref 70–99)

## 2023-04-14 LAB — AMMONIA: Ammonia: 15 umol/L (ref 9–35)

## 2023-04-14 MED ORDER — ENOXAPARIN SODIUM 30 MG/0.3ML IJ SOSY
30.0000 mg | PREFILLED_SYRINGE | INTRAMUSCULAR | Status: DC
  Administered 2023-04-15 – 2023-04-18 (×4): 30 mg via SUBCUTANEOUS
  Filled 2023-04-14 (×4): qty 0.3

## 2023-04-14 MED ORDER — SODIUM CHLORIDE 0.9 % IV SOLN
1.0000 g | Freq: Once | INTRAVENOUS | Status: AC
  Administered 2023-04-14: 1 g via INTRAVENOUS
  Filled 2023-04-14: qty 10

## 2023-04-14 MED ORDER — CARVEDILOL 25 MG PO TABS
25.0000 mg | ORAL_TABLET | Freq: Two times a day (BID) | ORAL | Status: DC
  Administered 2023-04-15 – 2023-04-18 (×7): 25 mg via ORAL
  Filled 2023-04-14 (×7): qty 1

## 2023-04-14 MED ORDER — ACETAMINOPHEN 325 MG PO TABS: 650.0000 mg | ORAL_TABLET | Freq: Four times a day (QID) | ORAL | Status: DC | PRN

## 2023-04-14 MED ORDER — CLOPIDOGREL BISULFATE 75 MG PO TABS
75.0000 mg | ORAL_TABLET | Freq: Every day | ORAL | Status: DC
  Administered 2023-04-15 – 2023-04-18 (×4): 75 mg via ORAL
  Filled 2023-04-14 (×4): qty 1

## 2023-04-14 MED ORDER — LACTATED RINGERS IV SOLN: INTRAVENOUS | Status: AC

## 2023-04-14 MED ORDER — AMLODIPINE BESYLATE 10 MG PO TABS
10.0000 mg | ORAL_TABLET | Freq: Every day | ORAL | Status: DC
  Administered 2023-04-15 – 2023-04-18 (×4): 10 mg via ORAL
  Filled 2023-04-14 (×4): qty 1

## 2023-04-14 MED ORDER — SODIUM CHLORIDE 0.9 % IV SOLN: INTRAVENOUS | Status: DC

## 2023-04-14 MED ORDER — SODIUM CHLORIDE 0.9 % IV BOLUS
1000.0000 mL | Freq: Once | INTRAVENOUS | Status: AC
  Administered 2023-04-14: 1000 mL via INTRAVENOUS

## 2023-04-14 MED ORDER — ATORVASTATIN CALCIUM 40 MG PO TABS
80.0000 mg | ORAL_TABLET | Freq: Every day | ORAL | Status: DC
  Administered 2023-04-15 – 2023-04-18 (×4): 80 mg via ORAL
  Filled 2023-04-14 (×4): qty 2

## 2023-04-14 MED ORDER — SODIUM CHLORIDE 0.9 % IV SOLN: 1.0000 g | INTRAVENOUS | Status: DC

## 2023-04-14 NOTE — ED Triage Notes (Signed)
Pt bib ems from home where pt was found by daughter to be responsive to pain. Pt CBG 56 on scene, pt normal blood sugar is in the 200's. Recently increased dose of metformin. Pt given 12.5g D10 with improvement to 203. Pt has had decreased PO intake recently, complains of "not feeling well". Pt clammy on arrival.

## 2023-04-14 NOTE — Assessment & Plan Note (Signed)
-  secondary to hypoglycemia -now resolved

## 2023-04-14 NOTE — Assessment & Plan Note (Signed)
-  continue home amlodipine, Coreg -hold ACE-I due to AKI

## 2023-04-14 NOTE — ED Provider Notes (Signed)
[facility] Provider Note  CSN: [id number] Arrival date & time: 04/14/23 1703  Chief Complaint(s) Altered Mental Status  HPI Samantha [name] is a 72 y.o. female with past medical history as below, significant for T2DM, HLD, HTN, CVA, PAD who presents to the ED with complaint of AMS.  Patient Spanish-speaking, offered interpreter but prefers to have daughter at bedside translate for her.  Reports that around 11 AM this morning she felt her legs were weak, she passed out.  She was in her bed and was briefly unresponsive.  Did not stop breathing, CPR was not performed by bystander.  Reports has been feeling weak for the past 2 to 3 days, left-sided abdominal pain, nausea and vomiting, poor p.o. intake.  She feels her symptoms have greatly improved after dextrose that was given by EMS, her CBG on arrival was in the 50s per EMS.  Typical blood sugars around 200 per family.  No recent falls, no fevers, she is currently nauseated.  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   CVA (cerebral vascular accident) 11/08/2021   Anxiety 04/27/2017   Non compliance w medication regimen 12/23/2016   History of ulceration 03/30/2016   Type II diabetes mellitus with neurological manifestations 03/30/2016   Type 2 diabetes mellitus with complication, with long-term current use of insulin 02/06/2016    Healthcare maintenance 02/06/2016   Critical lower limb ischemia 05/16/2015   Hyperlipidemia 04/12/2015   Depression 04/04/2015   PAD (peripheral artery disease) 04/04/2015   Diabetic ulcer of left foot associated with type 2 diabetes mellitus 02/25/2015   Diabetic neuropathy, painful 02/25/2015   Major depressive disorder, recurrent episode, moderate 02/25/2015   Essential hypertension 01/13/2015   Protein-calorie malnutrition, severe 01/11/2015   Diabetic toe ulcer 01/10/2015   Uncontrolled diabetes mellitus 01/10/2015   Home Medication(s) Prior to Admission medications   Medication Sig Start Date End Date Taking? Authorizing Provider  ibuprofen (ADVIL) 200 MG tablet Take 200 mg by mouth as needed for moderate pain.   Yes [provider]  insulin isophane & regular human KwikPen (HUMULIN 70/30 KWIKPEN) (70-30) 100 UNIT/ML KwikPen Inject 30 Units into the skin in the morning AND 25 Units every evening. Patient taking differently: Inject 30 Units into the skin in the morning AND 26 Units every evening. 04/12/23  Yes Hoy Register, MD  metFORMIN (GLUCOPHAGE) 1000 MG tablet Take 1 tablet (1,000 mg total) by mouth 2 (two) times daily with a meal. Patient taking differently: Take 1,000 mg by mouth daily. 04/12/23  Yes Hoy Register, MD  amLODipine (NORVASC) 10 MG tablet Take 1 tablet (10 mg total) by mouth daily. 04/12/23   Hoy Register, MD  atorvastatin (LIPITOR) 80 MG tablet Take 1 tablet (80 mg total) by mouth daily. Patient not taking: Reported on 04/15/2023 04/12/23   Hoy Register, MD  carvedilol (COREG) 25 MG tablet  Take 1 tablet (25 mg total) by mouth 2 (two) times daily with a meal. 04/12/23   Hoy Register, MD  clopidogrel (PLAVIX) 75 MG tablet Take 1 tablet (75 mg total) by mouth daily. Patient not taking: Reported on 04/15/2023 04/12/23   Hoy Register, MD  glimepiride (AMARYL) 4 MG tablet Take 1 tablet (4 mg total) by mouth daily with breakfast. Patient not taking:  Reported on 04/15/2023 04/12/23   Hoy Register, MD  glucose blood test strip Use 3 times daily before meals 07/21/18   Hoy Register, MD  Insulin Syringe-Needle U-100 (BD INSULIN SYRINGE ULTRAFINE) 31G X 15/64" 0.5 ML MISC Use subcutaneously twice daily 01/28/17   Hoy Register, MD  TRUEPLUS LANCETS 28G MISC 1 each by Does not apply route 3 (three) times daily before meals. 12/23/16   Hoy Register, MD  valsartan (DIOVAN) 320 MG tablet Take 1 tablet (320 mg total) by mouth daily. 04/12/23   Hoy Register, MD                                                                                                                                    Past Surgical History Past Surgical History:  Procedure Laterality Date   ABDOMINAL ANGIOGRAM  05/16/2015   Procedure: Abdominal Angiogram;  Surgeon: Runell Gess, MD;  Location: [facility] INVASIVE CV LAB;  Service: Cardiovascular;;   ABDOMINAL HYSTERECTOMY  ~ 2000   PERIPHERAL VASCULAR CATHETERIZATION Bilateral 05/16/2015   Procedure: Lower Extremity Angiography;  Surgeon: Runell Gess, MD; renal arteries widely patent, L-SFA 75%, L-pop 95%, L-peroneal 90%; R-SFA 40%, R-pop 50%, R-prox peroneal 90%; directional atherectomy L-SFA, p-pop, drug-eluting balloon angioplasty reducing the stenoses to 0, small linear dissection was not flow limiting       PERIPHERAL VASCULAR CATHETERIZATION Left 05/16/2015   Procedure: Peripheral Vascular Atherectomy;  Surgeon: Runell Gess, MD;  Location: [facility] INVASIVE CV LAB;  Service: Cardiovascular;  Laterality: Left;  sfa   Family History Family History  Problem Relation Age of Onset   Hypertension Mother     Social History Social History   Tobacco Use   Smoking status: Never   Smokeless tobacco: Never  Substance Use Topics   Alcohol use: Yes    Alcohol/week: 1.0 - 2.0 standard drink of alcohol    Types: 1 - 2 Cans of beer per week    Comment: occas   Drug use: No   Allergies Perflutren  Review of  Systems Review of Systems  Constitutional:  Positive for appetite change.  HENT:  Negative for congestion.   Respiratory:  Positive for cough and chest tightness.   Cardiovascular:  Negative for palpitations and leg swelling.  Gastrointestinal:  Positive for abdominal pain, nausea and vomiting.  Genitourinary:  Negative for dysuria and urgency.  Musculoskeletal:  Negative for gait problem and joint swelling.  Skin:  Negative for rash and wound.  Neurological:  Negative for tremors, seizures and headaches.  Psychiatric/Behavioral:  Negative for hallucinations. The patient is not hyperactive.     Physical Exam Vital Signs  I have reviewed the triage vital signs BP (!) 147/66 (BP Location: Left Arm)   Pulse 68   Temp 99.1 F (37.3 C) (Oral)   Resp 19   SpO2 97%  Physical Exam Vitals and nursing note reviewed.  Constitutional:      General: She is not in acute distress.    Appearance: Normal appearance.  HENT:     Head: Normocephalic and atraumatic.     Comments: No external signs of head trauma    Right Ear: External ear normal.     Left Ear: External ear normal.     Nose: Nose normal.     Mouth/Throat:     Mouth: Mucous membranes are dry.  Eyes:     General: No scleral icterus.       Right eye: No discharge.        Left eye: No discharge.     Extraocular Movements: Extraocular movements intact.     Pupils: Pupils are equal, round, and reactive to light.  Cardiovascular:     Rate and Rhythm: Normal rate and regular rhythm.     Pulses: Normal pulses.     Heart sounds: Normal heart sounds.  Pulmonary:     Effort: Pulmonary effort is normal. No respiratory distress.     Breath sounds: Normal breath sounds.  Abdominal:     General: Abdomen is flat.     Palpations: Abdomen is soft.     Tenderness: There is abdominal tenderness. There is no guarding or rebound.       Comments: Not peritoneal  Musculoskeletal:     Cervical back: No rigidity.     Right lower leg: No  edema.     Left lower leg: No edema.  Skin:    General: Skin is warm and dry.     Capillary Refill: Capillary refill takes less than 2 seconds.  Neurological:     Mental Status: She is alert and oriented to person, place, and time.     GCS: GCS eye subscore is 4. GCS verbal subscore is 5. GCS motor subscore is 6.     Cranial Nerves: Cranial nerves 2-12 are intact.     Sensory: Sensation is intact.     Motor: Motor function is intact. No tremor.     Coordination: Coordination is intact.     Comments: Gait not tested secondary to patient safety  Psychiatric:        Mood and Affect: Mood normal.        Behavior: Behavior normal.     ED Results and Treatments Labs (all labs ordered are listed, but only abnormal results are displayed) Labs Reviewed  CBC WITH DIFFERENTIAL/PLATELET - Abnormal; Notable for the following components:      Result Value   WBC 12.0 (*)    Neutro Abs 10.0 (*)    Monocytes Absolute 1.1 (*)    All other components within normal limits  COMPREHENSIVE METABOLIC PANEL - Abnormal; Notable for the following components:   CO2 20 (*)    BUN 36 (*)    Creatinine, Ser 2.88 (*)    Calcium 8.4 (*)    Albumin 3.4 (*)    GFR, Estimated 17 (*)    All other components within normal limits  URINALYSIS, ROUTINE W REFLEX MICROSCOPIC - Abnormal; Notable for the following components:   APPearance HAZY (*)    Hgb  urine dipstick MODERATE (*)    Protein, ur >=300 (*)    Leukocytes,Ua MODERATE (*)    Bacteria, UA MANY (*)    All other components within normal limits  BLOOD GAS, VENOUS - Abnormal; Notable for the following components:   pCO2, Ven 38 (*)    Bicarbonate 19.6 (*)    Acid-base deficit 6.0 (*)    All other components within normal limits  BRAIN NATRIURETIC PEPTIDE - Abnormal; Notable for the following components:   B Natriuretic Peptide 319.2 (*)    All other components within normal limits  CBC - Abnormal; Notable for the following components:   WBC 12.4 (*)     RBC 3.55 (*)    Hemoglobin 11.2 (*)    HCT 33.0 (*)    All other components within normal limits  BASIC METABOLIC PANEL - Abnormal; Notable for the following components:   CO2 18 (*)    BUN 35 (*)    Creatinine, Ser 2.74 (*)    Calcium 8.1 (*)    GFR, Estimated 18 (*)    All other components within normal limits  HEMOGLOBIN A1C - Abnormal; Notable for the following components:   Hgb A1c MFr Bld 7.7 (*)    All other components within normal limits  GLUCOSE, CAPILLARY - Abnormal; Notable for the following components:   Glucose-Capillary 185 (*)    All other components within normal limits  RESP PANEL BY RT-PCR (RSV, FLU A&B, COVID)  [id number]  URINE CULTURE  CULTURE, BLOOD (ROUTINE X 2)  CULTURE, BLOOD (ROUTINE X 2)  LIPASE, BLOOD  AMMONIA  CK  HIV ANTIBODY (ROUTINE TESTING W REFLEX)  GLUCOSE, CAPILLARY  GLUCOSE, CAPILLARY  GLUCOSE, CAPILLARY  CBG MONITORING, ED  CBG MONITORING, ED  CBG MONITORING, ED  CBG MONITORING, ED  TROPONIN I (HIGH SENSITIVITY)  TROPONIN I (HIGH SENSITIVITY)                                                                                                                          Radiology CT CHEST ABDOMEN PELVIS WO CONTRAST  Result Date: 04/14/2023 CLINICAL DATA:  Abnormal chest x-ray, and left lower quadrant abdominal pain. EXAM: CT CHEST, ABDOMEN AND PELVIS WITHOUT CONTRAST TECHNIQUE: Multidetector CT imaging of the chest, abdomen and pelvis was performed following the standard protocol without IV contrast. RADIATION DOSE REDUCTION: This exam was performed according to the departmental dose-optimization program which includes automated exposure control, adjustment of the mA and/or kV according to patient size and/or use of iterative reconstruction technique. COMPARISON:  Portable chest today, portable chest 01/10/2015. No prior body CT for comparison. FINDINGS: CT CHEST FINDINGS Cardiovascular: The heart is upper limit of normal in size. There is no  pericardial effusion. There are calcifications in the left main, lad and circumflex coronary arteries. The pulmonary arteries and veins are normal caliber. There are moderate patchy calcifications in the arch and descending aorta and proximal left subclavian artery, and a two-vessel aortic arch with normal  variant brachiobicarotid trunk. No aortic aneurysm is seen, with mild tortuosity in the descending segment. There is suspected at least 60-70% calcific stenosis in the proximal 1 cm of the left subclavian artery. Mediastinum/Nodes: Thyroid gland, axillary spaces are unremarkable. There are slightly prominent right of midline precarinal, and subcarinal lymph nodes up to 1 cm in short axis, similar sized single right mid hilar lymph node, but there is no further adenopathy. There is a small hiatal hernia with unremarkable thoracic esophagus. No abnormality is seen within the thoracic trachea and main bronchi apart from mucoid septations. There are also tracheobronchial chondral calcifications. Lungs/Pleura: There is diffuse bronchial thickening. No bronchial plugging is seen. There is subpleural reticulation in the mid to lower lung fields, without honeycombing, findings consistent with mild interstitial lung disease. There are mild atelectatic changes of the posterior lungs, without evidence of focal pneumonia. No lung nodules are seen. No pleural effusion, thickening or pneumothorax. Musculoskeletal: There are degenerative changes of the spine and shoulders. No acute or significant osseous findings or chest wall mass. CT ABDOMEN PELVIS FINDINGS Hepatobiliary: There are vascular calcifications at the hepatic hilum and a few tiny calcified granulomas in the liver. The liver is otherwise unremarkable. There is mild dilatation of the gallbladder and a few small stones in the proximal lumen, but no appreciable wall thickening, pericholecystic edema or biliary dilatation. Pancreas: There is mild glandular atrophy. No  focal mass is seen without contrast. There are either scattered vascular calcifications in the parenchyma or calcifications of chronic pancreatitis, but no acute inflammation is evident. Spleen: There are calcified granulomas. Patchy heavy calcification splenic artery. No splenomegaly or focal mass is seen without contrast. Adrenals/Urinary Tract: There is no adrenal mass. There is no focal abnormality of the unenhanced kidneys. Perinephric stranding symmetric and likely on a senescent basis. There are renovascular calcifications extending along both renal hila. No collecting system stones are seen and no hydronephrosis or ureteral stones. The bladder is contracted and not well seen but no adjacent inflammatory reaction or intravesical stones are noted. Stomach/Bowel: No dilatation or wall thickening including the appendix. There is sigmoid diverticulosis but no findings of acute diverticulitis. Mild fecal stasis. Vascular/Lymphatic: There is heavy aortoiliac and visceral branch vessel atherosclerosis. No AAA. No abdominopelvic adenopathy. Reproductive: Status post hysterectomy. No adnexal masses. Other: There is a small umbilical fat hernia. There is no incarcerated hernia. There is no free fluid, free hemorrhage or free air. Musculoskeletal: Mild osteopenia and degenerative change lumbar spine, spurring of the SI joints, and bridging osteophytes over the left inferior SI joint. No acute or other significant osseous findings. IMPRESSION: 1. Bronchitis without evidence of pneumonia. 2. Subpleural reticulation in the mid to lower lung fields consistent with mild interstitial lung disease. 3. Aortic and coronary artery atherosclerosis. Visceral branch vessel atherosclerosis in the abdomen. 4. 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 5. Small hiatal hernia. 6. Cholelithiasis with mild gallbladder dilatation but no wall thickening or pericholecystic edema. 7. Scattered tiny  calcifications in the pancreas, could be vascular or due to chronic calcific pancreatitis. No acute peripancreatic edema. 8. Constipation and diverticulosis. No bowel obstruction or inflammation. 9. Umbilical fat hernia. 10. Osteopenia and degenerative change. Electronically Signed   By: Almira Bar M.D.   On: 04/14/2023 20:28   CT Head Wo Contrast  Result Date: 04/14/2023 CLINICAL DATA:  Delirium. EXAM: CT HEAD WITHOUT CONTRAST CT CERVICAL SPINE WITHOUT CONTRAST TECHNIQUE: Multidetector CT imaging of the head and cervical spine was performed following  the standard protocol without intravenous contrast. Multiplanar CT image reconstructions of the cervical spine were also generated. RADIATION DOSE REDUCTION: This exam was performed according to the departmental dose-optimization program which includes automated exposure control, adjustment of the mA and/or kV according to patient size and/or use of iterative reconstruction technique. COMPARISON:  Head CT dated 11/08/2021. FINDINGS: CT HEAD FINDINGS Brain: The ventricles and sulci appropriate size for patient's age. Small area of infarct in the left cerebellar hemisphere, small left cerebellar old infarct. Subcentimeter right basal ganglia hypodense focus, likely a small old lacunar infarct. There is no acute intracranial hemorrhage. No mass effect or midline shift. No extra-axial fluid collection. Vascular: No hyperdense vessel or unexpected calcification. Skull: Normal. Negative for fracture or focal lesion. Sinuses/Orbits: Diffuse mucoperiosteal thickening of paranasal sinuses with partial opacification of maxillary sinuses. The mastoid air cells are clear. Other: None CT CERVICAL SPINE FINDINGS Alignment: No acute subluxation. Skull base and vertebrae: No acute fracture. Soft tissues and spinal canal: No prevertebral fluid or swelling. No visible canal hematoma. Disc levels:  No acute findings.  Degenerative changes. Upper chest: Negative. Other: Bilateral  carotid bulb calcified plaques. IMPRESSION: 1. No acute intracranial pathology. 2. 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 3. No acute/traumatic cervical spine pathology. 4. Paranasal sinus disease. Electronically Signed   By: Elgie Collard M.D.   On: 04/14/2023 20:04   CT CERVICAL SPINE WO CONTRAST  Result Date: 04/14/2023 CLINICAL DATA:  Delirium. EXAM: CT HEAD WITHOUT CONTRAST CT CERVICAL SPINE WITHOUT CONTRAST TECHNIQUE: Multidetector CT imaging of the head and cervical spine was performed following the standard protocol without intravenous contrast. Multiplanar CT image reconstructions of the cervical spine were also generated. RADIATION DOSE REDUCTION: This exam was performed according to the departmental dose-optimization program which includes automated exposure control, adjustment of the mA and/or kV according to patient size and/or use of iterative reconstruction technique. COMPARISON:  Head CT dated 11/08/2021. FINDINGS: CT HEAD FINDINGS Brain: The ventricles and sulci appropriate size for patient's age. Small area of infarct in the left cerebellar hemisphere, small left cerebellar old infarct. Subcentimeter right basal ganglia hypodense focus, likely a small old lacunar infarct. There is no acute intracranial hemorrhage. No mass effect or midline shift. No extra-axial fluid collection. Vascular: No hyperdense vessel or unexpected calcification. Skull: Normal. Negative for fracture or focal lesion. Sinuses/Orbits: Diffuse mucoperiosteal thickening of paranasal sinuses with partial opacification of maxillary sinuses. The mastoid air cells are clear. Other: None CT CERVICAL SPINE FINDINGS Alignment: No acute subluxation. Skull base and vertebrae: No acute fracture. Soft tissues and spinal canal: No prevertebral fluid or swelling. No visible canal hematoma. Disc levels:  No acute findings.  Degenerative changes. Upper chest: Negative. Other: Bilateral carotid bulb calcified plaques.  IMPRESSION: 1. No acute intracranial pathology. 2. 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 3. No acute/traumatic cervical spine pathology. 4. Paranasal sinus disease. Electronically Signed   By: Elgie Collard M.D.   On: 04/14/2023 20:04   DG Chest Portable 1 View  Result Date: 04/14/2023 CLINICAL DATA:  Chest pain. EXAM: PORTABLE CHEST 1 VIEW COMPARISON:  Chest x-ray 01/10/2015. FINDINGS: Low lung volumes. Streaky left basilar opacities. No visible pleural effusions or pneumothorax. No acute osseous abnormality. Cardiac silhouette is accentuated by technique and enlarged. IMPRESSION: Low lung volumes with streaky left basilar opacities, most likely atelectasis. Dedicated PA and lateral radiographs could better assess if clinically warranted. Electronically Signed   By: Feliberto Harts M.D.   On: 04/14/2023 18:23  Pertinent labs & imaging results that were available during my care of the patient were reviewed by me and considered in my medical decision making (see MDM for details).  Medications Ordered in ED Medications  acetaminophen (TYLENOL) tablet 650 mg (has no administration in time range)  lactated ringers infusion (has no administration in time range)  enoxaparin (LOVENOX) injection 30 mg (30 mg Subcutaneous Given 04/15/23 0943)  amLODipine (NORVASC) tablet 10 mg (10 mg Oral Given 04/15/23 0942)  atorvastatin (LIPITOR) tablet 80 mg (80 mg Oral Given 04/15/23 0942)  carvedilol (COREG) tablet 25 mg (25 mg Oral Given 04/15/23 0731)  clopidogrel (PLAVIX) tablet 75 mg (75 mg Oral Given 04/15/23 0943)  cefTRIAXone (ROCEPHIN) 2 g in sodium chloride 0.9 % 100 mL IVPB (has no administration in time range)  labetalol (NORMODYNE) injection 10 mg (has no administration in time range)  insulin aspart (novoLOG) injection 0-9 Units (2 Units Subcutaneous Given 04/15/23 1142)  insulin aspart (novoLOG) injection 0-5 Units (has no administration in time range)  guaiFENesin-dextromethorphan  (ROBITUSSIN DM) 100-10 MG/5ML syrup 5 mL (5 mLs Oral Given 04/15/23 1424)  sodium chloride 0.9 % bolus 1,000 mL (0 mLs Intravenous Stopped 04/14/23 2111)  cefTRIAXone (ROCEPHIN) 1 g in sodium chloride 0.9 % 100 mL IVPB (0 g Intravenous Stopped 04/14/23 2222)                                                                                                                                     Procedures .Critical Care  Performed by: Sloan Leiter, DO Authorized by: Sloan Leiter, DO   Critical care provider statement:    Critical care time (minutes):  40   Critical care time was exclusive of:  Separately billable procedures and treating other patients   Critical care was necessary to treat or prevent imminent or life-threatening deterioration of the following conditions:  Renal failure   Critical care was time spent personally by me on the following activities:  Development of treatment plan with patient or surrogate, discussions with consultants, evaluation of patient's response to treatment, examination of patient, ordering and review of laboratory studies, ordering and review of radiographic studies, ordering and performing treatments and interventions, pulse oximetry, re-evaluation of patient's condition, review of old charts and obtaining history from patient or surrogate   Care discussed with: admitting provider     (including critical care time)  Medical Decision Making / ED Course    Medical Decision Making:    Cacie Gaskins is a 72 y.o. female with past medical history as below, significant for T2DM, HLD, HTN, CVA, PAD who presents to the ED with complaint of AMS.. The complaint involves an extensive differential diagnosis and also carries with it a high risk of complications and morbidity.  Serious etiology was considered. Ddx includes but is not limited to: Differential diagnoses for altered mental status includes but is not exclusive to alcohol, illicit or prescription  medications, intracranial pathology such as stroke, intracerebral hemorrhage, fever or infectious causes including sepsis, hypoxemia, uremia, trauma, endocrine related disorders such as diabetes, hypoglycemia, thyroid-related diseases, etc. Differential diagnosis includes but is not exclusive to ectopic pregnancy, ovarian cyst, ovarian torsion, acute appendicitis, urinary tract infection, endometriosis, bowel obstruction, hernia, colitis, renal colic, gastroenteritis, volvulus etc. Differential includes all life-threatening causes for chest pain. This includes but is not exclusive to acute coronary syndrome, aortic dissection, pulmonary embolism, cardiac tamponade, community-acquired pneumonia, pericarditis, musculoskeletal chest wall pain, etc.   Complete initial physical exam performed, notably the patient  was AO x 3, neuroexam is nonfocal.    Reviewed and confirmed nursing documentation for past medical history, family history, social history.  Vital signs reviewed.    Clinical Course as of 04/15/23 1517  Wed Apr 14, 2023  1912 Creatinine(!): 2.88 Baseline around 1.5-1.8, concern for AKI, family reports poor po last few days w/ nausea and vomiting. Prior echo reviewed, continue IVF  Echo 11/22 LVEF 60-65% G1DD  [SG]  2103 Bacteria, UA(!): MANY [SG]  2103 WBC, UA: 21-50 [SG]  2103 Glori Luis): MODERATE Send urine culture, start rocephin [SG]  2104 RBC / HPF: 0-5 No stone on CT or hydronephrosis/hydroureter [SG]  2133 Bronchitis noted on CT, no pna [SG]    Clinical Course User Index [SG] Tanda Rockers A, DO   Urinalysis consistent with UTI, she is left flank pain CVA tenderness, nausea and vomiting.  Concern for possible pyelonephritis.  CT imaging does not demonstrate acute pyelonephritis fortunately but it was a noncontrasted study.  Sent urine culture, start Rocephin.  Creatinine worsened from prior, AKI.  Poor p.o. intake last 2 days associate with her nausea and  vomiting.  Continue IVF and anti-emetic  Patient encephalopathy, possibly secondary to hypoglycemia versus UTI/pyelonephritis, AKI.  Recommend admission for the above.  Patient family agreeable    Additional history obtained: -Additional history obtained from family -External records from outside source obtained and reviewed including: Chart review including previous notes, labs, imaging, consultation notes including primary care documentation, home medications, prior labs and imaging PCP note from 4/15 reviewed, recent adjustment for metformin   Lab Tests: -I ordered, reviewed, and interpreted labs.   The pertinent results include:   Labs Reviewed  CBC WITH DIFFERENTIAL/PLATELET - Abnormal; Notable for the following components:      Result Value   WBC 12.0 (*)    Neutro Abs 10.0 (*)    Monocytes Absolute 1.1 (*)    All other components within normal limits  COMPREHENSIVE METABOLIC PANEL - Abnormal; Notable for the following components:   CO2 20 (*)    BUN 36 (*)    Creatinine, Ser 2.88 (*)    Calcium 8.4 (*)    Albumin 3.4 (*)    GFR, Estimated 17 (*)    All other components within normal limits  URINALYSIS, ROUTINE W REFLEX MICROSCOPIC - Abnormal; Notable for the following components:   APPearance HAZY (*)    Hgb urine dipstick MODERATE (*)    Protein, ur >=300 (*)    Leukocytes,Ua MODERATE (*)    Bacteria, UA MANY (*)    All other components within normal limits  BLOOD GAS, VENOUS - Abnormal; Notable for the following components:   pCO2, Ven 38 (*)    Bicarbonate 19.6 (*)    Acid-base deficit 6.0 (*)    All other components within normal limits  BRAIN NATRIURETIC PEPTIDE - Abnormal; Notable for the following components:   B Natriuretic Peptide 319.2 (*)  All other components within normal limits  CBC - Abnormal; Notable for the following components:   WBC 12.4 (*)    RBC 3.55 (*)    Hemoglobin 11.2 (*)    HCT 33.0 (*)    All other components within normal  limits  BASIC METABOLIC PANEL - Abnormal; Notable for the following components:   CO2 18 (*)    BUN 35 (*)    Creatinine, Ser 2.74 (*)    Calcium 8.1 (*)    GFR, Estimated 18 (*)    All other components within normal limits  HEMOGLOBIN A1C - Abnormal; Notable for the following components:   Hgb A1c MFr Bld 7.7 (*)    All other components within normal limits  GLUCOSE, CAPILLARY - Abnormal; Notable for the following components:   Glucose-Capillary 185 (*)    All other components within normal limits  RESP PANEL BY RT-PCR (RSV, FLU A&B, COVID)  [id number]  URINE CULTURE  CULTURE, BLOOD (ROUTINE X 2)  CULTURE, BLOOD (ROUTINE X 2)  LIPASE, BLOOD  AMMONIA  CK  HIV ANTIBODY (ROUTINE TESTING W REFLEX)  GLUCOSE, CAPILLARY  GLUCOSE, CAPILLARY  GLUCOSE, CAPILLARY  CBG MONITORING, ED  CBG MONITORING, ED  CBG MONITORING, ED  CBG MONITORING, ED  TROPONIN I (HIGH SENSITIVITY)  TROPONIN I (HIGH SENSITIVITY)    Notable for as above  EKG   EKG Interpretation  Date/Time:  Wednesday April 14 2023 17:11:06 EDT Ventricular Rate:  60 PR Interval:  202 QRS Duration: 88 QT Interval:  470 QTC Calculation: 470 R Axis:   4 Text Interpretation: Sinus rhythm Repol abnrm suggests ischemia, lateral leads When compared with ECG of 11/08/2021, T wave inversion Lateral leads is now present Confirmed by Dione Booze ([id number]) on 04/14/2023 10:45:14 PM         Imaging Studies ordered: I ordered imaging studies including CT head/chest/abd/pelvis xr I independently visualized the following imaging with scope of interpretation limited to determining acute life threatening conditions related to emergency care; findings noted above, significant for bronchitis, chronic changes I independently visualized and interpreted imaging. I agree with the radiologist interpretation   Medicines ordered and prescription drug management: Meds ordered this encounter  Medications   sodium chloride 0.9 % bolus 1,000 mL    DISCONTD: 0.9 %  sodium chloride infusion   cefTRIAXone (ROCEPHIN) 1 g in sodium chloride 0.9 % 100 mL IVPB    Order Specific Question:   Antibiotic Indication:    Answer:   UTI   acetaminophen (TYLENOL) tablet 650 mg   lactated ringers infusion   enoxaparin (LOVENOX) injection 30 mg   DISCONTD: cefTRIAXone (ROCEPHIN) 1 g in sodium chloride 0.9 % 100 mL IVPB    Order Specific Question:   Antibiotic Indication:    Answer:   UTI   amLODipine (NORVASC) tablet 10 mg   atorvastatin (LIPITOR) tablet 80 mg   carvedilol (COREG) tablet 25 mg   clopidogrel (PLAVIX) tablet 75 mg   cefTRIAXone (ROCEPHIN) 2 g in sodium chloride 0.9 % 100 mL IVPB    Order Specific Question:   Antibiotic Indication:    Answer:   UTI   labetalol (NORMODYNE) injection 10 mg   insulin aspart (novoLOG) injection 0-9 Units    Order Specific Question:   Correction coverage:    Answer:   Sensitive (thin, NPO, renal)    Order Specific Question:   CBG < 70:    Answer:   Implement Hypoglycemia Standing Orders and refer to Hypoglycemia Standing Orders sidebar  report    Order Specific Question:   CBG 70 - 120:    Answer:   0 units    Order Specific Question:   CBG 121 - 150:    Answer:   1 unit    Order Specific Question:   CBG 151 - 200:    Answer:   2 units    Order Specific Question:   CBG 201 - 250:    Answer:   3 units    Order Specific Question:   CBG 251 - 300:    Answer:   5 units    Order Specific Question:   CBG 301 - 350:    Answer:   7 units    Order Specific Question:   CBG 351 - 400    Answer:   9 units    Order Specific Question:   CBG > 400    Answer:   call MD and obtain STAT lab verification   insulin aspart (novoLOG) injection 0-5 Units    Order Specific Question:   Correction coverage:    Answer:   HS scale    Order Specific Question:   CBG < 70:    Answer:   Implement Hypoglycemia Standing Orders and refer to Hypoglycemia Standing Orders sidebar report    Order Specific Question:   CBG 70 -  120:    Answer:   0 units    Order Specific Question:   CBG 121 - 150:    Answer:   0 units    Order Specific Question:   CBG 151 - 200:    Answer:   0 units    Order Specific Question:   CBG 201 - 250:    Answer:   2 units    Order Specific Question:   CBG 251 - 300:    Answer:   3 units    Order Specific Question:   CBG 301 - 350:    Answer:   4 units    Order Specific Question:   CBG 351 - 400:    Answer:   5 units    Order Specific Question:   CBG > 400    Answer:   call MD and obtain STAT lab verification   guaiFENesin-dextromethorphan (ROBITUSSIN DM) 100-10 MG/5ML syrup 5 mL    -I have reviewed the patients home medicines and have made adjustments as needed   Consultations Obtained: na   Cardiac Monitoring: The patient was maintained on a cardiac monitor.  I personally viewed and interpreted the cardiac monitored which showed an underlying rhythm of: NSR  Social Determinants of Health:  Diagnosis or treatment significantly limited by social determinants of health: spanish speakng   Reevaluation: After the interventions noted above, I reevaluated the patient and found that they have improved  Co morbidities that complicate the patient evaluation  Past Medical History:  Diagnosis Date   Acute pyelonephritis    Bacteremia, escherichia coli 01/12/2015   Chest pain    Critical lower limb ischemia    Hyperlipidemia    Hypertension    Left carotid bruit    PAD (peripheral artery disease)    Type II diabetes mellitus       Dispostion: Disposition decision including need for hospitalization was considered, and patient discharged from emergency department.    Final Clinical Impression(s) / ED Diagnoses Final diagnoses:  AKI (acute kidney injury)  Dehydration  Pyelonephritis     This chart was dictated using voice recognition software.  Despite  best efforts to proofread,  errors can occur which can change the documentation meaning.    Sloan Leiter,  DO 04/15/23 1517

## 2023-04-14 NOTE — ED Notes (Signed)
Pt is back from CT

## 2023-04-14 NOTE — Assessment & Plan Note (Signed)
-  continue plavix 

## 2023-04-14 NOTE — Assessment & Plan Note (Signed)
Pyelonephritis -as evident by fever, leukocytosis and positive UA -continue daily IV Rocephin -continuous IV fluid

## 2023-04-14 NOTE — ED Notes (Signed)
Pt went to CT

## 2023-04-14 NOTE — ED Notes (Signed)
Pt notified of need for urine sample. Pt placed on bedpan, unable to void at this time.

## 2023-04-14 NOTE — H&P (Signed)
History and Physical    Patient: Samantha [name] RUE:[id number] DOB: 12-10-1951 DOA: 04/14/2023 DOS: the patient was seen and examined on 04/14/2023 PCP: Hoy Register, MD  Patient coming from: Home  Chief Complaint:  Chief Complaint  Patient presents with   Altered Mental Status   HPI: Samantha [name] is a 72 y.o. female with medical history significant of PAD, HTN, insulin dependent T2DM, CKD3b, history of CVA, depression and anxiety who presents with altered mental status.   Pt is spanish-speaking only and daughter at bedside wanted to translate. Reports yesterday pt complained of feeling unwell. Had a mild cough, with chills. Decrease appetite. Today went to nap but daughter had difficulty waking her up. Found to be hypoglycemic with EMS down to 50s. Patient had continued to use her insulin despite poor oral intake. Had 26 units of her 70/30 last night and then 30 units this morning. Denies nausea or vomiting. Has left lower abdominal and left flank pain. No diarrhea. Unsure of fever.   In the ED, she was febrile with temperature 100.4 F, heart rate of 59, BP of 175/56 on room air.  Had leukocytosis of 12 with left shift.  Hemoglobin 12.1.  Creatinine with AKI of 2.88 from a baseline of around 1.5.  LFTs are within normal limits.  Lactate is normal.  Ammonia level was normal.  Negative flu/COVID/RSV PCR.  UA with moderate leukocyte, negative nitrite, moderate hemoglobin and many bacteria.  CT chest with findings consistent with mild interstital lung disease.  CT abd/pelvis with perinephric stranding symmetric and likely on a senescent basis. No hydronephrosis or ureteral stones. No other acute pathology.   She was given IV fluid bolus and started on IV Rocephin and hospitalist was consulted for admission.  Review of Systems: As mentioned in the history of present illness. All other systems reviewed and are negative. Past Medical History:  Diagnosis Date   Acute  pyelonephritis    Bacteremia, escherichia coli 01/12/2015   Chest pain    Critical lower limb ischemia    Hyperlipidemia    Hypertension    Left carotid bruit    PAD (peripheral artery disease)    Type II diabetes mellitus    Past Surgical History:  Procedure Laterality Date   ABDOMINAL ANGIOGRAM  05/16/2015   Procedure: Abdominal Angiogram;  Surgeon: Runell Gess, MD;  Location: [facility];  Service: Cardiovascular;;   ABDOMINAL HYSTERECTOMY  ~ 2000   PERIPHERAL VASCULAR CATHETERIZATION Bilateral 05/16/2015   Procedure: Lower Extremity Angiography;  Surgeon: Runell Gess, MD; renal arteries widely patent, L-SFA 75%, L-pop 95%, L-peroneal 90%; R-SFA 40%, R-pop 50%, R-prox peroneal 90%; directional atherectomy L-SFA, p-pop, drug-eluting balloon angioplasty reducing the stenoses to 0, small linear dissection was not flow limiting       PERIPHERAL VASCULAR CATHETERIZATION Left 05/16/2015   Procedure: Peripheral Vascular Atherectomy;  Surgeon: Runell Gess, MD;  Location: [facility];  Service: Cardiovascular;  Laterality: Left;  sfa   Social History:  reports that she has never smoked. She has never used smokeless tobacco. She reports current alcohol use of about 1.0 - 2.0 standard drink of alcohol per week. She reports that she does not use drugs.  Allergies  Allergen Reactions   Perflutren Other (See Comments)    Pt with back pain as soon as administered.    Family History  Problem Relation Age of Onset   Hypertension Mother     Prior to Admission medications   Medication Sig Start  Date End Date Taking? Authorizing Provider  amLODipine (NORVASC) 10 MG tablet Take 1 tablet (10 mg total) by mouth daily. 04/12/23   Hoy Register, MD  atorvastatin (LIPITOR) 80 MG tablet Take 1 tablet (80 mg total) by mouth daily. 04/12/23   Hoy Register, MD  Blood Glucose Monitoring Suppl (TRUE METRIX METER) DEVI 1 each by Does not apply route 3 (three) times daily before  meals. 12/23/16   Hoy Register, MD  carvedilol (COREG) 25 MG tablet Take 1 tablet (25 mg total) by mouth 2 (two) times daily with a meal. 04/12/23   Hoy Register, MD  clopidogrel (PLAVIX) 75 MG tablet Take 1 tablet (75 mg total) by mouth daily. 04/12/23   Hoy Register, MD  glimepiride (AMARYL) 4 MG tablet Take 1 tablet (4 mg total) by mouth daily with breakfast. 04/12/23   Hoy Register, MD  glucose blood test strip Use 3 times daily before meals 07/21/18   Hoy Register, MD  insulin isophane & regular human KwikPen (HUMULIN 70/30 KWIKPEN) (70-30) 100 UNIT/ML KwikPen Inject 30 Units into the skin in the morning AND 25 Units every evening. 04/12/23   Hoy Register, MD  Insulin Pen Needle (TRUEPLUS PEN NEEDLES) 31G X 6 MM MISC Inject 1 each into the skin 2 (two) times daily. 01/25/23   Hoy Register, MD  Insulin Syringe-Needle U-100 (BD INSULIN SYRINGE ULTRAFINE) 31G X 15/64" 0.5 ML MISC Use subcutaneously twice daily 01/28/17   Hoy Register, MD  metFORMIN (GLUCOPHAGE) 1000 MG tablet Take 1 tablet (1,000 mg total) by mouth 2 (two) times daily with a meal. 04/12/23   Hoy Register, MD  TRUEPLUS LANCETS 28G MISC 1 each by Does not apply route 3 (three) times daily before meals. 12/23/16   Hoy Register, MD  valsartan (DIOVAN) 320 MG tablet Take 1 tablet (320 mg total) by mouth daily. 04/12/23   Hoy Register, MD    Physical Exam: Vitals:   04/14/23 2140 04/14/23 2147 04/14/23 2150 04/14/23 2304  BP:    (!) 198/62  Pulse:  76 78 79  Resp:    18  Temp: (!) 100.4 F (38 C)   99.7 F (37.6 C)  TempSrc: Oral   Oral  SpO2:  99% 100% 96%   Constitutional: NAD, calm, comfortable, ill appearing elderly female appearing younger than stated age laying in bed Eyes: lids and conjunctivae normal ENMT: Mucous membranes are moist. Neck: normal, supple Respiratory: clear to auscultation bilaterally, no wheezing, no crackles. Normal respiratory effort. No accessory muscle use.   Cardiovascular: Regular rate and rhythm, no murmurs / rubs / gallops. No extremity edema.  Abdomen: Soft, nondistended, mild left lower quadrant and left CVA tenderness.  Bowel sounds positive.  Musculoskeletal: no clubbing / cyanosis. No joint deformity upper and lower extremities. Good ROM, no contractures. Normal muscle tone.  Skin: no rashes, lesions, ulcers. No induration Neurologic: CN 2-12 grossly intact. Strength 5/5 in all 4.  Psychiatric: Normal judgment and insight. Alert and oriented x 3. Normal mood. Data Reviewed:  See HPI  Assessment and Plan: * Sepsis secondary to UTI Pyelonephritis -as evident by fever, leukocytosis and positive UA -continue daily IV Rocephin -continuous IV fluid  Hypoglycemia -in the setting of insulin use and low oral intake -EMS found CBG 50s in the field. Improved wit D10 -continue to check q4hr overnight -hold insulin. Home regimen: Humulin 70/30 - 30 units in the AM, 26 units at night -hold metformin and SU  Acute metabolic encephalopathy -secondary to hypoglycemia -now resolved  History of CVA (cerebrovascular accident) -continue plavix  AKI (acute kidney injury) -Creatinine with AKI of 2.88 from a baseline of around 1.5. -pre-renal from decrease oral intake -continuous IV fluid  -avoid nephrotoxic agent or contrasted study  Essential hypertension -continue home amlodipine, Coreg -hold ACE-I due to AKI      Advance Care Planning: Full  Consults: none  Family Communication: Daughter at bedside  Severity of Illness: The appropriate patient status for this patient is INPATIENT. Inpatient status is judged to be reasonable and necessary in order to provide the required intensity of service to ensure the patient's safety. The patient's presenting symptoms, physical exam findings, and initial radiographic and laboratory data in the context of their chronic comorbidities is felt to place them at high risk for further clinical  deterioration. Furthermore, it is not anticipated that the patient will be medically stable for discharge from the hospital within 2 midnights of admission.   * I certify that at the point of admission it is my clinical judgment that the patient will require inpatient hospital care spanning beyond 2 midnights from the point of admission due to high intensity of service, high risk for further deterioration and high frequency of surveillance required.*  Author: Anselm Jungling, DO 04/14/2023 11:38 PM  For on call review [email or web address].

## 2023-04-14 NOTE — Assessment & Plan Note (Signed)
-  in the setting of insulin use and low oral intake -EMS found CBG 50s in the field. Improved wit D10 -continue to check q4hr overnight -hold insulin. Home regimen: Humulin 70/30 - 30 units in the AM, 26 units at night -hold metformin and SU

## 2023-04-14 NOTE — ED Notes (Signed)
ED TO INPATIENT HANDOFF REPORT  ED Nurse Name and Phone #: [phone] [name] Name/Age/Gender Samantha [name] 72 y.o. female Room/Bed: WA18/WA18  Code Status   Code Status: Prior  Home/SNF/Other Home Patient oriented to: self, place, time, and situation Is this baseline? Yes    Triage Complete: Triage complete  Chief Complaint Pyelonephritis [N12]  Triage Note Pt bib ems from home where pt was found by daughter to be responsive to pain. Pt CBG 56 on scene, pt normal blood sugar is in the 200's. Recently increased dose of metformin. Pt given 12.5g D10 with improvement to 203. Pt has had decreased PO intake recently, complains of "not feeling well". Pt clammy on arrival.    Allergies Allergies  Allergen Reactions   Perflutren Other (See Comments)    Pt with back pain as soon as administered.    Level of Care/Admitting Diagnosis ED Disposition     ED Disposition  Admit   Condition  --   Comment  Hospital Area: [facility] [[id number]]  Level of Care: Telemetry [5]  Admit to tele based on following criteria: Other see comments  Comments: rate  May admit patient to [facility] or [facility] if equivalent level of care is available:: No  Covid Evaluation: Asymptomatic - no recent exposure (last 10 days) testing not required  Diagnosis: Pyelonephritis [[id number]]  Admitting Physician: Anselm Jungling [[id number]]  Attending Physician: Anselm Jungling [[id number]]  Certification:: I certify this patient will need inpatient services for at least 2 midnights  Estimated Length of Stay: 2          B Medical/Surgery History Past Medical History:  Diagnosis Date   Acute pyelonephritis    Bacteremia, escherichia coli 01/12/2015   Chest pain    Critical lower limb ischemia    Hyperlipidemia    Hypertension    Left carotid bruit    PAD (peripheral artery disease)    Type II diabetes mellitus    Past Surgical History:  Procedure Laterality Date   ABDOMINAL  ANGIOGRAM  05/16/2015   Procedure: Abdominal Angiogram;  Surgeon: Runell Gess, MD;  Location: [facility];  Service: Cardiovascular;;   ABDOMINAL HYSTERECTOMY  ~ 2000   PERIPHERAL VASCULAR CATHETERIZATION Bilateral 05/16/2015   Procedure: Lower Extremity Angiography;  Surgeon: Runell Gess, MD; renal arteries widely patent, L-SFA 75%, L-pop 95%, L-peroneal 90%; R-SFA 40%, R-pop 50%, R-prox peroneal 90%; directional atherectomy L-SFA, p-pop, drug-eluting balloon angioplasty reducing the stenoses to 0, small linear dissection was not flow limiting       PERIPHERAL VASCULAR CATHETERIZATION Left 05/16/2015   Procedure: Peripheral Vascular Atherectomy;  Surgeon: Runell Gess, MD;  Location: [facility];  Service: Cardiovascular;  Laterality: Left;  sfa     A IV Location/Drains/Wounds Patient Lines/Drains/Airways Status     Active Line/Drains/Airways     Name Placement date Placement time Site Days   Peripheral IV 11/08/21 18 G Right Antecubital 11/08/21  --  Antecubital  522   Peripheral IV 04/14/23 18 G Left Antecubital 04/14/23  1726  Antecubital  less than 1   Wound / Incision (Open or Dehisced) 01/15/15 Diabetic ulcer Foot Left DM foot ulcer to 4th toe on left foot 01/15/15  1800  Foot  3011            Intake/Output Last 24 hours  Intake/Output Summary (Last 24 hours) at 04/14/2023 2220 Last data filed at 04/14/2023 2111 Gross per 24 hour  Intake 1000  ml  Output --  Net 1000 ml    Labs/Imaging Results for orders placed or performed during the hospital encounter of 04/14/23 (from the past 48 hour(s))  CBG monitoring, ED     Status: None   Collection Time: 04/14/23  5:10 PM  Result Value Ref Range   Glucose-Capillary 93 70 - 99 mg/dL    Comment: Glucose reference range applies only to samples taken after fasting for at least 8 hours.  CBC with Differential     Status: Abnormal   Collection Time: 04/14/23  5:15 PM  Result Value Ref Range   WBC 12.0 (H)  4.0 - 10.5 K/uL   RBC 3.89 3.87 - 5.11 MIL/uL   Hemoglobin 12.1 12.0 - 15.0 g/dL   HCT 09.8 11.9 - 14.7 %   MCV 93.3 80.0 - 100.0 fL   MCH 31.1 26.0 - 34.0 pg   MCHC 33.3 30.0 - 36.0 g/dL   RDW 82.9 56.2 - 13.0 %   Platelets 324 150 - 400 K/uL   nRBC 0.0 0.0 - 0.2 %   Neutrophils Relative % 84 %   Neutro Abs 10.0 (H) 1.7 - 7.7 K/uL   Lymphocytes Relative 7 %   Lymphs Abs 0.8 0.7 - 4.0 K/uL   Monocytes Relative 9 %   Monocytes Absolute 1.1 (H) 0.1 - 1.0 K/uL   Eosinophils Relative 0 %   Eosinophils Absolute 0.0 0.0 - 0.5 K/uL   Basophils Relative 0 %   Basophils Absolute 0.1 0.0 - 0.1 K/uL   Immature Granulocytes 0 %   Abs Immature Granulocytes 0.05 0.00 - 0.07 K/uL    Comment: Performed at [facility], [address], [place], [place] [address]  Comprehensive metabolic panel     Status: Abnormal   Collection Time: 04/14/23  5:15 PM  Result Value Ref Range   Sodium 136 135 - 145 mmol/L   Potassium 3.6 3.5 - 5.1 mmol/L   Chloride 106 98 - 111 mmol/L   CO2 20 (L) 22 - 32 mmol/L   Glucose, Bld 95 70 - 99 mg/dL    Comment: Glucose reference range applies only to samples taken after fasting for at least 8 hours.   BUN 36 (H) 8 - 23 mg/dL   Creatinine, Ser 4.69 (H) 0.44 - 1.00 mg/dL   Calcium 8.4 (L) 8.9 - 10.3 mg/dL   Total Protein 6.9 6.5 - 8.1 g/dL   Albumin 3.4 (L) 3.5 - 5.0 g/dL   AST 18 15 - 41 U/L   ALT 11 0 - 44 U/L   Alkaline Phosphatase 62 38 - 126 U/L   Total Bilirubin 0.7 0.3 - 1.2 mg/dL   GFR, Estimated 17 (L) >60 mL/min    Comment: (NOTE) Calculated using the CKD-EPI Creatinine Equation (2021)    Anion gap 10 5 - 15    Comment: Performed at [facility], [address] [place], [place] [address]  Lipase, blood     Status: None   Collection Time: 04/14/23  5:15 PM  Result Value Ref Range   Lipase 34 11 - 51 U/L    Comment: Performed at [facility], [address], [place], [place] [address]  Troponin  I (High Sensitivity)     Status: None   Collection Time: 04/14/23  5:15 PM  Result Value Ref Range   Troponin I (High Sensitivity) 16 <18 ng/L    Comment: (NOTE) Elevated high sensitivity troponin I (hsTnI) values and significant  changes across serial measurements may suggest ACS but many other  chronic and acute conditions are known to elevate hsTnI results.  Refer to the "Links" section for chest pain algorithms and additional  guidance. Performed at [facility], [address] [place], [place] [address]   Urinalysis, Routine w reflex microscopic -Urine, Clean Catch     Status: Abnormal   Collection Time: 04/14/23  5:18 PM  Result Value Ref Range   Color, Urine YELLOW YELLOW   APPearance HAZY (A) CLEAR   Specific Gravity, Urine 1.010 1.005 - 1.030   pH 5.0 5.0 - 8.0   Glucose, UA NEGATIVE NEGATIVE mg/dL   Hgb urine dipstick MODERATE (A) NEGATIVE   Bilirubin Urine NEGATIVE NEGATIVE   Ketones, ur NEGATIVE NEGATIVE mg/dL   Protein, ur >=604 (A) NEGATIVE mg/dL   Nitrite NEGATIVE NEGATIVE   Leukocytes,Ua MODERATE (A) NEGATIVE   RBC / HPF 0-5 0 - 5 RBC/hpf   WBC, UA 21-50 0 - 5 WBC/hpf   Bacteria, UA MANY (A) NONE SEEN   Squamous Epithelial / HPF 0-5 0 - 5 /HPF   WBC Clumps PRESENT    Budding Yeast PRESENT     Comment: Performed at [facility], [address] [place], [place] [address]  Ammonia     Status: None   Collection Time: 04/14/23  5:30 PM  Result Value Ref Range   Ammonia 15 9 - 35 umol/L    Comment: Performed at [facility], [address] [place], [place] [address]  Brain natriuretic peptide     Status: Abnormal   Collection Time: 04/14/23  5:30 PM  Result Value Ref Range   B Natriuretic Peptide 319.2 (H) 0.0 - 100.0 pg/mL    Comment: Performed at [facility], [address], [place], [place] [address]  Resp panel by RT-PCR (RSV, Flu A&B, Covid) Anterior Nasal Swab     Status: None    Collection Time: 04/14/23  5:30 PM   Specimen: Anterior Nasal Swab  Result Value Ref Range   SARS Coronavirus 2 by RT PCR NEGATIVE NEGATIVE    Comment: (NOTE) SARS-CoV-2 target nucleic acids are NOT DETECTED.  The SARS-CoV-2 RNA is generally detectable in upper respiratory specimens during the acute phase of infection. The lowest concentration of SARS-CoV-2 viral copies this assay can detect is 138 copies/mL. A negative result does not preclude SARS-Cov-2 infection and should not be used as the sole basis for treatment or other patient management decisions. A negative result may occur with  improper specimen collection/handling, submission of specimen other than nasopharyngeal swab, presence of viral mutation(s) within the areas targeted by this assay, and inadequate number of viral copies(<138 copies/mL). A negative result must be combined with clinical observations, patient history, and epidemiological information. The expected result is Negative.  Fact Sheet for Patients:  BloggerCourse.com  Fact Sheet for Healthcare Providers:  SeriousBroker.it  This test is no t yet approved or cleared by the [place] FDA and  has been authorized for detection and/or diagnosis of SARS-CoV-2 by FDA under an Emergency Use Authorization (EUA). This EUA will remain  in effect (meaning this test can be used) for the duration of the COVID-19 declaration under Section 564(b)(1) of the Act, 21 U.S.C.section 360bbb-3(b)(1), unless the authorization is terminated  or revoked sooner.       Influenza A by PCR NEGATIVE NEGATIVE   Influenza B by PCR NEGATIVE NEGATIVE    Comment: (NOTE) The Xpert Xpress  SARS-CoV-2/FLU/RSV plus assay is intended as an aid in the diagnosis of influenza from Nasopharyngeal swab specimens and should not be used as a sole basis for treatment. Nasal washings and aspirates are unacceptable for Xpert Xpress  SARS-CoV-2/FLU/RSV testing.  Fact Sheet for Patients: BloggerCourse.com  Fact Sheet for Healthcare Providers: SeriousBroker.it  This test is not yet approved or cleared by the [place] FDA and has been authorized for detection and/or diagnosis of SARS-CoV-2 by FDA under an Emergency Use Authorization (EUA). This EUA will remain in effect (meaning this test can be used) for the duration of the COVID-19 declaration under Section 564(b)(1) of the Act, 21 U.S.C. section 360bbb-3(b)(1), unless the authorization is terminated or revoked.     Resp Syncytial Virus by PCR NEGATIVE NEGATIVE    Comment: (NOTE) Fact Sheet for Patients: BloggerCourse.com  Fact Sheet for Healthcare Providers: SeriousBroker.it  This test is not yet approved or cleared by the [place] FDA and has been authorized for detection and/or diagnosis of SARS-CoV-2 by FDA under an Emergency Use Authorization (EUA). This EUA will remain in effect (meaning this test can be used) for the duration of the COVID-19 declaration under Section 564(b)(1) of the Act, 21 U.S.C. section 360bbb-3(b)(1), unless the authorization is terminated or revoked.  Performed at [facility], [address] [place], [place] [address]   CBG monitoring, ED     Status: None   Collection Time: 04/14/23  5:35 PM  Result Value Ref Range   Glucose-Capillary 79 70 - 99 mg/dL    Comment: Glucose reference range applies only to samples taken after fasting for at least 8 hours.  CBG monitoring, ED     Status: None   Collection Time: 04/14/23  6:01 PM  Result Value Ref Range   Glucose-Capillary 71 70 - 99 mg/dL    Comment: Glucose reference range applies only to samples taken after fasting for at least 8 hours.  Blood gas, venous (at [facility])     Status: Abnormal   Collection Time: 04/14/23  6:40 PM  Result Value Ref  Range   pH, Ven 7.32 7.25 - 7.43   pCO2, Ven 38 (L) 44 - 60 mmHg   pO2, Ven 42 32 - 45 mmHg   Bicarbonate 19.6 (L) 20.0 - 28.0 mmol/L   Acid-base deficit 6.0 (H) 0.0 - 2.0 mmol/L   O2 Saturation 72.5 %   Patient temperature 37.0     Comment: Performed at [facility], [address] [place], [place] [address]  Troponin I (High Sensitivity)     Status: None   Collection Time: 04/14/23  7:22 PM  Result Value Ref Range   Troponin I (High Sensitivity) 15 <18 ng/L    Comment: (NOTE) Elevated high sensitivity troponin I (hsTnI) values and significant  changes across serial measurements may suggest ACS but many other  chronic and acute conditions are known to elevate hsTnI results.  Refer to the "Links" section for chest pain algorithms and additional  guidance. Performed at [facility], [address] [place], [place] [address]   CBG monitoring, ED     Status: None   Collection Time: 04/14/23  7:55 PM  Result Value Ref Range   Glucose-Capillary 88 70 - 99 mg/dL    Comment: Glucose reference range applies only to samples taken after fasting for at least 8 hours.   Comment 1 Notify RN    CT CHEST ABDOMEN PELVIS WO CONTRAST  Result Date:  04/14/2023 CLINICAL DATA:  Abnormal chest x-ray, and left lower quadrant abdominal pain. EXAM: CT CHEST, ABDOMEN AND PELVIS WITHOUT CONTRAST TECHNIQUE: Multidetector CT imaging of the chest, abdomen and pelvis was performed following the standard protocol without IV contrast. RADIATION DOSE REDUCTION: This exam was performed according to the departmental dose-optimization program which includes automated exposure control, adjustment of the mA and/or kV according to patient size and/or use of iterative reconstruction technique. COMPARISON:  Portable chest today, portable chest 01/10/2015. No prior body CT for comparison. FINDINGS: CT CHEST FINDINGS Cardiovascular: The heart is upper limit of normal in size. There is no  pericardial effusion. There are calcifications in the left main, lad and circumflex coronary arteries. The pulmonary arteries and veins are normal caliber. There are moderate patchy calcifications in the arch and descending aorta and proximal left subclavian artery, and a two-vessel aortic arch with normal variant brachiobicarotid trunk. No aortic aneurysm is seen, with mild tortuosity in the descending segment. There is suspected at least 60-70% calcific stenosis in the proximal 1 cm of the left subclavian artery. Mediastinum/Nodes: Thyroid gland, axillary spaces are unremarkable. There are slightly prominent right of midline precarinal, and subcarinal lymph nodes up to 1 cm in short axis, similar sized single right mid hilar lymph node, but there is no further adenopathy. There is a small hiatal hernia with unremarkable thoracic esophagus. No abnormality is seen within the thoracic trachea and main bronchi apart from mucoid septations. There are also tracheobronchial chondral calcifications. Lungs/Pleura: There is diffuse bronchial thickening. No bronchial plugging is seen. There is subpleural reticulation in the mid to lower lung fields, without honeycombing, findings consistent with mild interstitial lung disease. There are mild atelectatic changes of the posterior lungs, without evidence of focal pneumonia. No lung nodules are seen. No pleural effusion, thickening or pneumothorax. Musculoskeletal: There are degenerative changes of the spine and shoulders. No acute or significant osseous findings or chest wall mass. CT ABDOMEN PELVIS FINDINGS Hepatobiliary: There are vascular calcifications at the hepatic hilum and a few tiny calcified granulomas in the liver. The liver is otherwise unremarkable. There is mild dilatation of the gallbladder and a few small stones in the proximal lumen, but no appreciable wall thickening, pericholecystic edema or biliary dilatation. Pancreas: There is mild glandular atrophy. No  focal mass is seen without contrast. There are either scattered vascular calcifications in the parenchyma or calcifications of chronic pancreatitis, but no acute inflammation is evident. Spleen: There are calcified granulomas. Patchy heavy calcification splenic artery. No splenomegaly or focal mass is seen without contrast. Adrenals/Urinary Tract: There is no adrenal mass. There is no focal abnormality of the unenhanced kidneys. Perinephric stranding symmetric and likely on a senescent basis. There are renovascular calcifications extending along both renal hila. No collecting system stones are seen and no hydronephrosis or ureteral stones. The bladder is contracted and not well seen but no adjacent inflammatory reaction or intravesical stones are noted. Stomach/Bowel: No dilatation or wall thickening including the appendix. There is sigmoid diverticulosis but no findings of acute diverticulitis. Mild fecal stasis. Vascular/Lymphatic: There is heavy aortoiliac and visceral branch vessel atherosclerosis. No AAA. No abdominopelvic adenopathy. Reproductive: Status post hysterectomy. No adnexal masses. Other: There is a small umbilical fat hernia. There is no incarcerated hernia. There is no free fluid, free hemorrhage or free air. Musculoskeletal: Mild osteopenia and degenerative change lumbar spine, spurring of the SI joints, and bridging osteophytes over the left inferior SI joint. No acute or other significant osseous findings. IMPRESSION: 1. Bronchitis  without evidence of pneumonia. 2. Subpleural reticulation in the mid to lower lung fields consistent with mild interstitial lung disease. 3. Aortic and coronary artery atherosclerosis. Visceral branch vessel atherosclerosis in the abdomen. 4. 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 5. Small hiatal hernia. 6. Cholelithiasis with mild gallbladder dilatation but no wall thickening or pericholecystic edema. 7. Scattered tiny  calcifications in the pancreas, could be vascular or due to chronic calcific pancreatitis. No acute peripancreatic edema. 8. Constipation and diverticulosis. No bowel obstruction or inflammation. 9. Umbilical fat hernia. 10. Osteopenia and degenerative change. Electronically Signed   By: Almira Bar M.D.   On: 04/14/2023 20:28   CT Head Wo Contrast  Result Date: 04/14/2023 CLINICAL DATA:  Delirium. EXAM: CT HEAD WITHOUT CONTRAST CT CERVICAL SPINE WITHOUT CONTRAST TECHNIQUE: Multidetector CT imaging of the head and cervical spine was performed following the standard protocol without intravenous contrast. Multiplanar CT image reconstructions of the cervical spine were also generated. RADIATION DOSE REDUCTION: This exam was performed according to the departmental dose-optimization program which includes automated exposure control, adjustment of the mA and/or kV according to patient size and/or use of iterative reconstruction technique. COMPARISON:  Head CT dated 11/08/2021. FINDINGS: CT HEAD FINDINGS Brain: The ventricles and sulci appropriate size for patient's age. Small area of infarct in the left cerebellar hemisphere, small left cerebellar old infarct. Subcentimeter right basal ganglia hypodense focus, likely a small old lacunar infarct. There is no acute intracranial hemorrhage. No mass effect or midline shift. No extra-axial fluid collection. Vascular: No hyperdense vessel or unexpected calcification. Skull: Normal. Negative for fracture or focal lesion. Sinuses/Orbits: Diffuse mucoperiosteal thickening of paranasal sinuses with partial opacification of maxillary sinuses. The mastoid air cells are clear. Other: None CT CERVICAL SPINE FINDINGS Alignment: No acute subluxation. Skull base and vertebrae: No acute fracture. Soft tissues and spinal canal: No prevertebral fluid or swelling. No visible canal hematoma. Disc levels:  No acute findings.  Degenerative changes. Upper chest: Negative. Other: Bilateral  carotid bulb calcified plaques. IMPRESSION: 1. No acute intracranial pathology. 2. 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 3. No acute/traumatic cervical spine pathology. 4. Paranasal sinus disease. Electronically Signed   By: Elgie Collard M.D.   On: 04/14/2023 20:04   CT CERVICAL SPINE WO CONTRAST  Result Date: 04/14/2023 CLINICAL DATA:  Delirium. EXAM: CT HEAD WITHOUT CONTRAST CT CERVICAL SPINE WITHOUT CONTRAST TECHNIQUE: Multidetector CT imaging of the head and cervical spine was performed following the standard protocol without intravenous contrast. Multiplanar CT image reconstructions of the cervical spine were also generated. RADIATION DOSE REDUCTION: This exam was performed according to the departmental dose-optimization program which includes automated exposure control, adjustment of the mA and/or kV according to patient size and/or use of iterative reconstruction technique. COMPARISON:  Head CT dated 11/08/2021. FINDINGS: CT HEAD FINDINGS Brain: The ventricles and sulci appropriate size for patient's age. Small area of infarct in the left cerebellar hemisphere, small left cerebellar old infarct. Subcentimeter right basal ganglia hypodense focus, likely a small old lacunar infarct. There is no acute intracranial hemorrhage. No mass effect or midline shift. No extra-axial fluid collection. Vascular: No hyperdense vessel or unexpected calcification. Skull: Normal. Negative for fracture or focal lesion. Sinuses/Orbits: Diffuse mucoperiosteal thickening of paranasal sinuses with partial opacification of maxillary sinuses. The mastoid air cells are clear. Other: None CT CERVICAL SPINE FINDINGS Alignment: No acute subluxation. Skull base and vertebrae: No acute fracture. Soft tissues and spinal canal: No prevertebral fluid or swelling. No  visible canal hematoma. Disc levels:  No acute findings.  Degenerative changes. Upper chest: Negative. Other: Bilateral carotid bulb calcified plaques.  IMPRESSION: 1. No acute intracranial pathology. 2. 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 3. No acute/traumatic cervical spine pathology. 4. Paranasal sinus disease. Electronically Signed   By: Elgie Collard M.D.   On: 04/14/2023 20:04   DG Chest Portable 1 View  Result Date: 04/14/2023 CLINICAL DATA:  Chest pain. EXAM: PORTABLE CHEST 1 VIEW COMPARISON:  Chest x-ray 01/10/2015. FINDINGS: Low lung volumes. Streaky left basilar opacities. No visible pleural effusions or pneumothorax. No acute osseous abnormality. Cardiac silhouette is accentuated by technique and enlarged. IMPRESSION: Low lung volumes with streaky left basilar opacities, most likely atelectasis. Dedicated PA and lateral radiographs could better assess if clinically warranted. Electronically Signed   By: Feliberto Harts M.D.   On: 04/14/2023 18:23    Pending Labs Unresulted Labs (From admission, onward)     Start     Ordered   04/14/23 2104  Urine Culture  Once,   URGENT       Question:  Indication  Answer:  Dysuria   04/14/23 2103   04/14/23 1726  CK  Once,   URGENT        04/14/23 1725            Vitals/Pain Today's Vitals   04/14/23 2119 04/14/23 2140 04/14/23 2147 04/14/23 2150  BP:      Pulse:   76 78  Resp:      Temp: 98.8 F (37.1 C) (!) 100.4 F (38 C)    TempSrc: Oral Oral    SpO2:   99% 100%  PainSc:        Isolation Precautions No active isolations  Medications Medications  acetaminophen (TYLENOL) tablet 650 mg (has no administration in time range)  lactated ringers infusion (has no administration in time range)  sodium chloride 0.9 % bolus 1,000 mL (0 mLs Intravenous Stopped 04/14/23 2111)  cefTRIAXone (ROCEPHIN) 1 g in sodium chloride 0.9 % 100 mL IVPB (1 g Intravenous New Bag/Given 04/14/23 2143)    Mobility manual wheelchair     Focused Assessments Cardiac Assessment Handoff:    Lab Results  Component Value Date   TROPONINI 0.04 (H) 01/11/2015   No results found  for: "DDIMER" Does the Patient currently have chest pain? Yes    R Recommendations: See Admitting Provider Note  Report given to:   Additional Notes: assist with walker

## 2023-04-14 NOTE — ED Notes (Signed)
Pt. CBG 88, RN made aware.

## 2023-04-14 NOTE — Assessment & Plan Note (Signed)
-  Creatinine with AKI of 2.88 from a baseline of around 1.5. -pre-renal from decrease oral intake -continuous IV fluid  -avoid nephrotoxic agent or contrasted study

## 2023-04-14 NOTE — ED Notes (Signed)
Sent urine with culture  

## 2023-04-14 NOTE — ED Notes (Signed)
Pt. Made aware for the need of urine specimen. 

## 2023-04-15 LAB — BASIC METABOLIC PANEL
Anion gap: 9 (ref 5–15)
BUN: 35 mg/dL — ABNORMAL HIGH (ref 8–23)
CO2: 18 mmol/L — ABNORMAL LOW (ref 22–32)
Calcium: 8.1 mg/dL — ABNORMAL LOW (ref 8.9–10.3)
Chloride: 109 mmol/L (ref 98–111)
Creatinine, Ser: 2.74 mg/dL — ABNORMAL HIGH (ref 0.44–1.00)
GFR, Estimated: 18 mL/min — ABNORMAL LOW (ref >60)
Glucose, Bld: 84 mg/dL (ref 70–99)
Potassium: 3.6 mmol/L (ref 3.5–5.1)
Sodium: 136 mmol/L (ref 135–145)

## 2023-04-15 LAB — CBC
HCT: 33 % — ABNORMAL LOW (ref 36.0–46.0)
Hemoglobin: 11.2 g/dL — ABNORMAL LOW (ref 12.0–15.0)
MCH: 31.5 pg (ref 26.0–34.0)
MCHC: 33.9 g/dL (ref 30.0–36.0)
MCV: 93 fL (ref 80.0–100.0)
Platelets: 280 10*3/uL (ref 150–400)
RBC: 3.55 MIL/uL — ABNORMAL LOW (ref 3.87–5.11)
RDW: 12.6 % (ref 11.5–15.5)
WBC: 12.4 10*3/uL — ABNORMAL HIGH (ref 4.0–10.5)
nRBC: 0 % (ref 0.0–0.2)

## 2023-04-15 LAB — GLUCOSE, CAPILLARY
Glucose-Capillary: 89 mg/dL (ref 70–99)
Glucose-Capillary: 84 mg/dL (ref 70–99)
Glucose-Capillary: 185 mg/dL — ABNORMAL HIGH (ref 70–99)
Glucose-Capillary: 136 mg/dL — ABNORMAL HIGH (ref 70–99)
Glucose-Capillary: 210 mg/dL — ABNORMAL HIGH (ref 70–99)

## 2023-04-15 LAB — HIV ANTIBODY (ROUTINE TESTING W REFLEX): HIV Screen 4th Generation wRfx: NONREACTIVE

## 2023-04-15 LAB — URINE CULTURE

## 2023-04-15 LAB — HEMOGLOBIN A1C
Hgb A1c MFr Bld: 7.7 % — ABNORMAL HIGH (ref 4.8–5.6)
Mean Plasma Glucose: 174.29 mg/dL

## 2023-04-15 LAB — CK: Total CK: 233 U/L (ref 38–234)

## 2023-04-15 MED ORDER — INSULIN ASPART 100 UNIT/ML IJ SOLN
0.0000 [IU] | Freq: Three times a day (TID) | INTRAMUSCULAR | Status: DC
  Administered 2023-04-15: 2 [IU] via SUBCUTANEOUS
  Administered 2023-04-15: 1 [IU] via SUBCUTANEOUS
  Administered 2023-04-16: 2 [IU] via SUBCUTANEOUS
  Administered 2023-04-16: 3 [IU] via SUBCUTANEOUS
  Administered 2023-04-16: 1 [IU] via SUBCUTANEOUS
  Administered 2023-04-17: 2 [IU] via SUBCUTANEOUS
  Administered 2023-04-17 (×2): 3 [IU] via SUBCUTANEOUS
  Administered 2023-04-18: 5 [IU] via SUBCUTANEOUS
  Administered 2023-04-18: 2 [IU] via SUBCUTANEOUS

## 2023-04-15 MED ORDER — INSULIN ASPART 100 UNIT/ML IJ SOLN
0.0000 [IU] | Freq: Every day | INTRAMUSCULAR | Status: DC
  Administered 2023-04-15: 2 [IU] via SUBCUTANEOUS
  Administered 2023-04-16: 3 [IU] via SUBCUTANEOUS

## 2023-04-15 MED ORDER — GUAIFENESIN-DM 100-10 MG/5ML PO SYRP
5.0000 mL | ORAL_SOLUTION | ORAL | Status: DC | PRN
  Administered 2023-04-15 – 2023-04-17 (×6): 5 mL via ORAL
  Filled 2023-04-15 (×6): qty 5

## 2023-04-15 MED ORDER — MELATONIN 5 MG PO TABS
5.0000 mg | ORAL_TABLET | Freq: Every evening | ORAL | Status: DC | PRN
  Administered 2023-04-18: 5 mg via ORAL
  Filled 2023-04-15: qty 1

## 2023-04-15 MED ORDER — LABETALOL HCL 5 MG/ML IV SOLN: 10.0000 mg | INTRAVENOUS | Status: DC | PRN

## 2023-04-15 MED ORDER — SODIUM CHLORIDE 0.9 % IV SOLN
2.0000 g | INTRAVENOUS | Status: DC
  Administered 2023-04-15: 2 g via INTRAVENOUS
  Filled 2023-04-15: qty 20

## 2023-04-15 NOTE — Progress Notes (Signed)
PROGRESS NOTE  Samantha [name]  ZOX:[id number] DOB: 10-24-1951 DOA: 04/14/2023 PCP: Hoy Register, MD   Brief Narrative: Patient is a 72 year old female with history of peripheral artery disease, hypertension, insulin-dependent diabetes type 2, CKD stage IIIb, CVA, anxiety, depression who presented with altered mental status, feeling unwell, chills..  Patient speaks Spanish.  Her daughter found her difficulty to wake up.  EMS saw her and found to be hypoglycemic.  On presentation, she was febrile, hypertensive.  Lab work showed leukocytosis.  Creatinine was 2.88, baseline creatinine around 1.5.  Ammonia level normal.  UA showed moderate leukocytes, negative nitrate, many bacteria.  CT abdomen/pelvis showed symmetrical perinephric stranding, no hydronephrosis or ureteral stones.  Patient was admitted for the management of UTI, AKI and possible pyonephritis.  Assessment & Plan:  Principal Problem:   Sepsis secondary to UTI Active Problems:   Essential hypertension   Type 2 diabetes mellitus with complication, with long-term current use of insulin   Pyelonephritis   AKI (acute kidney injury)   History of CVA (cerebrovascular accident)   Acute metabolic encephalopathy   Hypoglycemia  Sepsis secondary to UTI/ +/-pyonephritis,:Presented with fever, leukocytosis.  UA suspicious for UTI.CT abdomen/pelvis showed symmetrical  perinephric stranding, no hydronephrosis or ureteral stones.  She does not have costovertebral tenderness but complains of lower abdominal discomfort. started on ceftriaxone.  Continue IV fluids.  Follow-up urine culture, blood culture  Diabetes type 2/hypoglycemia: Found to be hypoglycemic at home.  Takes hemoglobin 70/30, metformin at home.  Continue sliding scale.  Monitor blood sugars.  A1c of 7.7  Acute metabolic encephalopathy: Most likely secondary to hypoglycemia.  Currently alert and oriented.  History of CVA: On Plavix  AKI on CKD stage IIIb: Baseline  creatinine 1.5.  Presented with urine in the range of 2.8. AKI most likely from decreased oral intake.  Continue IV fluids. Avoid nephrotoxins.  She needs nephrology referral as an outpatient  Hypertension: on amlodipine, Coreg.  ARB inhibitor on hold due to AKI.  Remains hypertensive.  Continue prn medications for severe hypertension  Generalized weakness: PT consulted        DVT prophylaxis:enoxaparin (LOVENOX) injection 30 mg Start: 04/15/23 1000     Code Status: Full Code  Family Communication: Discussed with daughter at bedside on 4/18  Patient status: Inpatient  Patient is from : Home  Anticipated discharge to: Home  Estimated DC date: 1 to 2 days   Consultants: None  Procedures: None  Antimicrobials:  Anti-infectives (From admission, onward)    Start     Dose/Rate Route Frequency Ordered Stop   04/15/23 2200  cefTRIAXone (ROCEPHIN) 1 g in sodium chloride 0.9 % 100 mL IVPB        1 g 200 mL/hr over 30 Minutes Intravenous Every 24 hours 04/14/23 2325     04/14/23 2115  cefTRIAXone (ROCEPHIN) 1 g in sodium chloride 0.9 % 100 mL IVPB        1 g 200 mL/hr over 30 Minutes Intravenous  Once 04/14/23 2103 04/14/23 2222       Subjective: Patient seen and examined at bedside today.  Hemodynamically stable.  She complains of weakness and not feeling well.  She had mild grade fever of 100.4 F last night but afebrile this morning blood pressure was high.  Daughter present at bedside.  Complains of some lower abdominal discomfort  Objective: Vitals:   04/14/23 2147 04/14/23 2150 04/14/23 2304 04/15/23 0427  BP:   (!) 198/62 (!) 183/60  Pulse: 76 78 79 79  Resp:   18 18  Temp:   99.7 F (37.6 C) 99.9 F (37.7 C)  TempSrc:   Oral Oral  SpO2: 99% 100% 96% 96%    Intake/Output Summary (Last 24 hours) at 04/15/2023 0817 Last data filed at 04/14/2023 2222 Gross per 24 hour  Intake 1100 ml  Output --  Net 1100 ml   There were no vitals filed for this  visit.  Examination:  General exam: Overall comfortable, not in distress, weak appearing HEENT: PERRL Respiratory system:  no wheezes or crackles  Cardiovascular system: S1 & S2 heard, RRR.  Gastrointestinal system: Abdomen is nondistended, soft and has mild tenderness on the lower abdomen.  Bowel sounds present.  No costovertebral angle tenderness Central nervous system: Alert and oriented Extremities: No edema, no clubbing ,no cyanosis Skin: No rashes, no ulcers,no icterus     Data Reviewed: I have personally reviewed following labs and imaging studies  CBC: Recent Labs  Lab 04/14/23 1715 04/15/23 0549  WBC 12.0* 12.4*  NEUTROABS 10.0*  --   HGB 12.1 11.2*  HCT 36.3 33.0*  MCV 93.3 93.0  PLT 324 280   Basic Metabolic Panel: Recent Labs  Lab 04/12/23 1702 04/14/23 1715 04/15/23 0549  NA 140 136 136  K 4.6 3.6 3.6  CL 103 106 109  CO2 19* 20* 18*  GLUCOSE 246* 95 84  BUN 39* 36* 35*  CREATININE 2.56* 2.88* 2.74*  CALCIUM 9.3 8.4* 8.1*     Recent Results (from the past 240 hour(s))  Resp panel by RT-PCR (RSV, Flu A&B, Covid) Anterior Nasal Swab     Status: None   Collection Time: 04/14/23  5:30 PM   Specimen: Anterior Nasal Swab  Result Value Ref Range Status   SARS Coronavirus 2 by RT PCR NEGATIVE NEGATIVE Final    Comment: (NOTE) SARS-CoV-2 target nucleic acids are NOT DETECTED.  The SARS-CoV-2 RNA is generally detectable in upper respiratory specimens during the acute phase of infection. The lowest concentration of SARS-CoV-2 viral copies this assay can detect is 138 copies/mL. A negative result does not preclude SARS-Cov-2 infection and should not be used as the sole basis for treatment or other patient management decisions. A negative result may occur with  improper specimen collection/handling, submission of specimen other than nasopharyngeal swab, presence of viral mutation(s) within the areas targeted by this assay, and inadequate number of  viral copies(<138 copies/mL). A negative result must be combined with clinical observations, patient history, and epidemiological information. The expected result is Negative.  Fact Sheet for Patients:  BloggerCourse.com  Fact Sheet for Healthcare Providers:  SeriousBroker.it  This test is no t yet approved or cleared by the [place] FDA and  has been authorized for detection and/or diagnosis of SARS-CoV-2 by FDA under an Emergency Use Authorization (EUA). This EUA will remain  in effect (meaning this test can be used) for the duration of the COVID-19 declaration under Section 564(b)(1) of the Act, 21 U.S.C.section 360bbb-3(b)(1), unless the authorization is terminated  or revoked sooner.       Influenza A by PCR NEGATIVE NEGATIVE Final   Influenza B by PCR NEGATIVE NEGATIVE Final    Comment: (NOTE) The Xpert Xpress SARS-CoV-2/FLU/RSV plus assay is intended as an aid in the diagnosis of influenza from Nasopharyngeal swab specimens and should not be used as a sole basis for treatment. Nasal washings and aspirates are unacceptable for Xpert Xpress SARS-CoV-2/FLU/RSV testing.  Fact Sheet for Patients: BloggerCourse.com  Fact Sheet for Healthcare Providers:  SeriousBroker.it  This test is not yet approved or cleared by the [place] and has been authorized for detection and/or diagnosis of SARS-CoV-2 by FDA under an Emergency Use Authorization (EUA). This EUA will remain in effect (meaning this test can be used) for the duration of the COVID-19 declaration under Section 564(b)(1) of the Act, 21 U.S.C. section 360bbb-3(b)(1), unless the authorization is terminated or revoked.     Resp Syncytial Virus by PCR NEGATIVE NEGATIVE Final    Comment: (NOTE) Fact Sheet for Patients: BloggerCourse.com  Fact Sheet for Healthcare  Providers: SeriousBroker.it  This test is not yet approved or cleared by the [place] FDA and has been authorized for detection and/or diagnosis of SARS-CoV-2 by FDA under an Emergency Use Authorization (EUA). This EUA will remain in effect (meaning this test can be used) for the duration of the COVID-19 declaration under Section 564(b)(1) of the Act, 21 U.S.C. section 360bbb-3(b)(1), unless the authorization is terminated or revoked.  Performed at [facility], [address] [place], [place] [address]      Radiology Studies: CT CHEST ABDOMEN PELVIS WO CONTRAST  Result Date: 04/14/2023 CLINICAL DATA:  Abnormal chest x-ray, and left lower quadrant abdominal pain. EXAM: CT CHEST, ABDOMEN AND PELVIS WITHOUT CONTRAST TECHNIQUE: Multidetector CT imaging of the chest, abdomen and pelvis was performed following the standard protocol without IV contrast. RADIATION DOSE REDUCTION: This exam was performed according to the departmental dose-optimization program which includes automated exposure control, adjustment of the mA and/or kV according to patient size and/or use of iterative reconstruction technique. COMPARISON:  Portable chest today, portable chest 01/10/2015. No prior body CT for comparison. FINDINGS: CT CHEST FINDINGS Cardiovascular: The heart is upper limit of normal in size. There is no pericardial effusion. There are calcifications in the left main, lad and circumflex coronary arteries. The pulmonary arteries and veins are normal caliber. There are moderate patchy calcifications in the arch and descending aorta and proximal left subclavian artery, and a two-vessel aortic arch with normal variant brachiobicarotid trunk. No aortic aneurysm is seen, with mild tortuosity in the descending segment. There is suspected at least 60-70% calcific stenosis in the proximal 1 cm of the left subclavian artery. Mediastinum/Nodes: Thyroid gland, axillary  spaces are unremarkable. There are slightly prominent right of midline precarinal, and subcarinal lymph nodes up to 1 cm in short axis, similar sized single right mid hilar lymph node, but there is no further adenopathy. There is a small hiatal hernia with unremarkable thoracic esophagus. No abnormality is seen within the thoracic trachea and main bronchi apart from mucoid septations. There are also tracheobronchial chondral calcifications. Lungs/Pleura: There is diffuse bronchial thickening. No bronchial plugging is seen. There is subpleural reticulation in the mid to lower lung fields, without honeycombing, findings consistent with mild interstitial lung disease. There are mild atelectatic changes of the posterior lungs, without evidence of focal pneumonia. No lung nodules are seen. No pleural effusion, thickening or pneumothorax. Musculoskeletal: There are degenerative changes of the spine and shoulders. No acute or significant osseous findings or chest wall mass. CT ABDOMEN PELVIS FINDINGS Hepatobiliary: There are vascular calcifications at the hepatic hilum and a few tiny calcified granulomas in the liver. The liver is otherwise unremarkable. There is mild dilatation of the gallbladder and a few small stones in the proximal lumen, but no appreciable wall thickening, pericholecystic edema or biliary dilatation. Pancreas: There is mild glandular atrophy. No focal mass is seen without contrast. There are either scattered vascular calcifications  in the parenchyma or calcifications of chronic pancreatitis, but no acute inflammation is evident. Spleen: There are calcified granulomas. Patchy heavy calcification splenic artery. No splenomegaly or focal mass is seen without contrast. Adrenals/Urinary Tract: There is no adrenal mass. There is no focal abnormality of the unenhanced kidneys. Perinephric stranding symmetric and likely on a senescent basis. There are renovascular calcifications extending along both renal  hila. No collecting system stones are seen and no hydronephrosis or ureteral stones. The bladder is contracted and not well seen but no adjacent inflammatory reaction or intravesical stones are noted. Stomach/Bowel: No dilatation or wall thickening including the appendix. There is sigmoid diverticulosis but no findings of acute diverticulitis. Mild fecal stasis. Vascular/Lymphatic: There is heavy aortoiliac and visceral branch vessel atherosclerosis. No AAA. No abdominopelvic adenopathy. Reproductive: Status post hysterectomy. No adnexal masses. Other: There is a small umbilical fat hernia. There is no incarcerated hernia. There is no free fluid, free hemorrhage or free air. Musculoskeletal: Mild osteopenia and degenerative change lumbar spine, spurring of the SI joints, and bridging osteophytes over the left inferior SI joint. No acute or other significant osseous findings. IMPRESSION: 1. Bronchitis without evidence of pneumonia. 2. Subpleural reticulation in the mid to lower lung fields consistent with mild interstitial lung disease. 3. Aortic and coronary artery atherosclerosis. Visceral branch vessel atherosclerosis in the abdomen. 4. 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 5. Small hiatal hernia. 6. Cholelithiasis with mild gallbladder dilatation but no wall thickening or pericholecystic edema. 7. Scattered tiny calcifications in the pancreas, could be vascular or due to chronic calcific pancreatitis. No acute peripancreatic edema. 8. Constipation and diverticulosis. No bowel obstruction or inflammation. 9. Umbilical fat hernia. 10. Osteopenia and degenerative change. Electronically Signed   By: Almira Bar M.D.   On: 04/14/2023 20:28   CT Head Wo Contrast  Result Date: 04/14/2023 CLINICAL DATA:  Delirium. EXAM: CT HEAD WITHOUT CONTRAST CT CERVICAL SPINE WITHOUT CONTRAST TECHNIQUE: Multidetector CT imaging of the head and cervical spine was performed following the  standard protocol without intravenous contrast. Multiplanar CT image reconstructions of the cervical spine were also generated. RADIATION DOSE REDUCTION: This exam was performed according to the departmental dose-optimization program which includes automated exposure control, adjustment of the mA and/or kV according to patient size and/or use of iterative reconstruction technique. COMPARISON:  Head CT dated 11/08/2021. FINDINGS: CT HEAD FINDINGS Brain: The ventricles and sulci appropriate size for patient's age. Small area of infarct in the left cerebellar hemisphere, small left cerebellar old infarct. Subcentimeter right basal ganglia hypodense focus, likely a small old lacunar infarct. There is no acute intracranial hemorrhage. No mass effect or midline shift. No extra-axial fluid collection. Vascular: No hyperdense vessel or unexpected calcification. Skull: Normal. Negative for fracture or focal lesion. Sinuses/Orbits: Diffuse mucoperiosteal thickening of paranasal sinuses with partial opacification of maxillary sinuses. The mastoid air cells are clear. Other: None CT CERVICAL SPINE FINDINGS Alignment: No acute subluxation. Skull base and vertebrae: No acute fracture. Soft tissues and spinal canal: No prevertebral fluid or swelling. No visible canal hematoma. Disc levels:  No acute findings.  Degenerative changes. Upper chest: Negative. Other: Bilateral carotid bulb calcified plaques. IMPRESSION: 1. No acute intracranial pathology. 2. 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 3. No acute/traumatic cervical spine pathology. 4. Paranasal sinus disease. Electronically Signed   By: Elgie Collard M.D.   On: 04/14/2023 20:04   CT CERVICAL SPINE WO CONTRAST  Result Date: 04/14/2023 CLINICAL DATA:  Delirium. EXAM: CT  HEAD WITHOUT CONTRAST CT CERVICAL SPINE WITHOUT CONTRAST TECHNIQUE: Multidetector CT imaging of the head and cervical spine was performed following the standard protocol without  intravenous contrast. Multiplanar CT image reconstructions of the cervical spine were also generated. RADIATION DOSE REDUCTION: This exam was performed according to the departmental dose-optimization program which includes automated exposure control, adjustment of the mA and/or kV according to patient size and/or use of iterative reconstruction technique. COMPARISON:  Head CT dated 11/08/2021. FINDINGS: CT HEAD FINDINGS Brain: The ventricles and sulci appropriate size for patient's age. Small area of infarct in the left cerebellar hemisphere, small left cerebellar old infarct. Subcentimeter right basal ganglia hypodense focus, likely a small old lacunar infarct. There is no acute intracranial hemorrhage. No mass effect or midline shift. No extra-axial fluid collection. Vascular: No hyperdense vessel or unexpected calcification. Skull: Normal. Negative for fracture or focal lesion. Sinuses/Orbits: Diffuse mucoperiosteal thickening of paranasal sinuses with partial opacification of maxillary sinuses. The mastoid air cells are clear. Other: None CT CERVICAL SPINE FINDINGS Alignment: No acute subluxation. Skull base and vertebrae: No acute fracture. Soft tissues and spinal canal: No prevertebral fluid or swelling. No visible canal hematoma. Disc levels:  No acute findings.  Degenerative changes. Upper chest: Negative. Other: Bilateral carotid bulb calcified plaques. IMPRESSION: 1. No acute intracranial pathology. 2. 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 3. No acute/traumatic cervical spine pathology. 4. Paranasal sinus disease. Electronically Signed   By: Elgie Collard M.D.   On: 04/14/2023 20:04   DG Chest Portable 1 View  Result Date: 04/14/2023 CLINICAL DATA:  Chest pain. EXAM: PORTABLE CHEST 1 VIEW COMPARISON:  Chest x-ray 01/10/2015. FINDINGS: Low lung volumes. Streaky left basilar opacities. No visible pleural effusions or pneumothorax. No acute osseous abnormality. Cardiac silhouette is  accentuated by technique and enlarged. IMPRESSION: Low lung volumes with streaky left basilar opacities, most likely atelectasis. Dedicated PA and lateral radiographs could better assess if clinically warranted. Electronically Signed   By: Feliberto Harts M.D.   On: 04/14/2023 18:23    Scheduled Meds:  amLODipine  10 mg Oral Daily   atorvastatin  80 mg Oral Daily   carvedilol  25 mg Oral BID WC   clopidogrel  75 mg Oral Daily   enoxaparin (LOVENOX) injection  30 mg Subcutaneous Q24H   Continuous Infusions:  cefTRIAXone (ROCEPHIN)  IV     lactated ringers       LOS: 1 day   Burnadette Pop, MD [facility] P4/18/2024, 8:17 AM

## 2023-04-15 NOTE — TOC Initial Note (Signed)
Transition of Care [facility]) - Initial/Assessment Note    Patient Details  Name: Samantha [name] MRN: [id number] Date of Birth: 03/28/1951  [facility]) CM/SW Contact:    Durenda Guthrie, RN Phone Number: 04/15/2023, 9:44 AM  Clinical Narrative:                  Transition of Care [facility]) Department has reviewed patient and no TOC needs have been identified at this time. We will continue to monitor patient advancement through Interdisciplinary progressions and if new patient needs arise, please place a consult.       Patient Goals and CMS Choice            Expected Discharge Plan and Services                                              Prior Living Arrangements/Services                       Activities of Daily Living      Permission Sought/Granted                  Emotional Assessment              Admission diagnosis:  Dehydration [E86.0] Pyelonephritis [N12] AKI (acute kidney injury) [N17.9] Patient Active Problem List   Diagnosis Date Noted   Pyelonephritis 04/14/2023   Sepsis secondary to UTI 04/14/2023   AKI (acute kidney injury) 04/14/2023   History of CVA (cerebrovascular accident) 04/14/2023   Acute metabolic encephalopathy 04/14/2023   Hypoglycemia 04/14/2023   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   CVA (cerebral vascular accident) 11/08/2021   Anxiety 04/27/2017   Non compliance w medication regimen 12/23/2016   History of ulceration 03/30/2016   Type II diabetes mellitus with neurological manifestations 03/30/2016   Type 2 diabetes mellitus with complication, with long-term current use of insulin 02/06/2016   Healthcare maintenance 02/06/2016   Critical lower limb ischemia 05/16/2015   Hyperlipidemia 04/12/2015   Depression 04/04/2015   PAD (peripheral artery disease) 04/04/2015   Diabetic ulcer of left foot associated with type 2 diabetes mellitus 02/25/2015   Diabetic neuropathy, painful 02/25/2015    Major depressive disorder, recurrent episode, moderate 02/25/2015   Essential hypertension 01/13/2015   Protein-calorie malnutrition, severe 01/11/2015   Diabetic toe ulcer 01/10/2015   Uncontrolled diabetes mellitus 01/10/2015   PCP:  Hoy Register, MD Pharmacy:   [facility] - [facility] [address], [address] [place] [address] Phone: [phone] Fax: [phone]  [facility] [place], [place] - [address] [place] [address] Phone: [phone] Fax: [phone]  [facility] - [place], [place] - [address] [place] [address] Phone: [phone] Fax: [phone]     Social Determinants of Health (SDOH) Social History: SDOH Screenings   Depression [phone]): Low Risk  (11/19/2021)  Tobacco Use: Low Risk  (04/14/2023)   SDOH Interventions:     Readmission Risk Interventions     No data to display

## 2023-04-16 ENCOUNTER — Inpatient Hospital Stay (HOSPITAL_COMMUNITY): Payer: Medicaid Other

## 2023-04-16 DIAGNOSIS — N390 Urinary tract infection, site not specified: Secondary | ICD-10-CM

## 2023-04-16 DIAGNOSIS — A419 Sepsis, unspecified organism: Secondary | ICD-10-CM

## 2023-04-16 LAB — BASIC METABOLIC PANEL
Anion gap: 7 (ref 5–15)
BUN: 36 mg/dL — ABNORMAL HIGH (ref 8–23)
CO2: 19 mmol/L — ABNORMAL LOW (ref 22–32)
Calcium: 8.2 mg/dL — ABNORMAL LOW (ref 8.9–10.3)
Chloride: 108 mmol/L (ref 98–111)
Creatinine, Ser: 2.64 mg/dL — ABNORMAL HIGH (ref 0.44–1.00)
GFR, Estimated: 19 mL/min — ABNORMAL LOW (ref >60)
Glucose, Bld: 122 mg/dL — ABNORMAL HIGH (ref 70–99)
Potassium: 3.8 mmol/L (ref 3.5–5.1)
Sodium: 134 mmol/L — ABNORMAL LOW (ref 135–145)

## 2023-04-16 LAB — GLUCOSE, CAPILLARY
Glucose-Capillary: 124 mg/dL — ABNORMAL HIGH (ref 70–99)
Glucose-Capillary: 203 mg/dL — ABNORMAL HIGH (ref 70–99)
Glucose-Capillary: 161 mg/dL — ABNORMAL HIGH (ref 70–99)
Glucose-Capillary: 265 mg/dL — ABNORMAL HIGH (ref 70–99)

## 2023-04-16 LAB — CBC
HCT: 32.4 % — ABNORMAL LOW (ref 36.0–46.0)
Hemoglobin: 10.7 g/dL — ABNORMAL LOW (ref 12.0–15.0)
MCH: 31.5 pg (ref 26.0–34.0)
MCHC: 33 g/dL (ref 30.0–36.0)
MCV: 95.3 fL (ref 80.0–100.0)
Platelets: 260 10*3/uL (ref 150–400)
RBC: 3.4 MIL/uL — ABNORMAL LOW (ref 3.87–5.11)
RDW: 12.6 % (ref 11.5–15.5)
WBC: 9.9 10*3/uL (ref 4.0–10.5)
nRBC: 0 % (ref 0.0–0.2)

## 2023-04-16 LAB — URINE CULTURE: Culture: >=100000 — AB

## 2023-04-16 LAB — CULTURE, BLOOD (ROUTINE X 2): Culture: NO GROWTH

## 2023-04-16 LAB — SODIUM, URINE, RANDOM: Sodium, Ur: 67 mmol/L

## 2023-04-16 MED ORDER — STERILE WATER FOR INJECTION IV SOLN
INTRAVENOUS | Status: DC
  Filled 2023-04-16: qty 150
  Filled 2023-04-16: qty 1000

## 2023-04-16 MED ORDER — GUAIFENESIN ER 600 MG PO TB12
600.0000 mg | ORAL_TABLET | Freq: Two times a day (BID) | ORAL | Status: DC
  Administered 2023-04-16 (×2): 600 mg via ORAL
  Filled 2023-04-16 (×2): qty 1

## 2023-04-16 MED ORDER — HYDRALAZINE HCL 25 MG PO TABS
25.0000 mg | ORAL_TABLET | Freq: Three times a day (TID) | ORAL | Status: DC
  Administered 2023-04-16 – 2023-04-18 (×7): 25 mg via ORAL
  Filled 2023-04-16 (×7): qty 1

## 2023-04-16 MED ORDER — SODIUM CHLORIDE 0.9 % IV SOLN: INTRAVENOUS | Status: DC

## 2023-04-16 MED ORDER — SODIUM BICARBONATE 650 MG PO TABS
650.0000 mg | ORAL_TABLET | Freq: Three times a day (TID) | ORAL | Status: DC
  Administered 2023-04-16 – 2023-04-18 (×7): 650 mg via ORAL
  Filled 2023-04-16 (×7): qty 1

## 2023-04-16 MED ORDER — SODIUM CHLORIDE 0.9 % IV BOLUS
500.0000 mL | Freq: Once | INTRAVENOUS | Status: AC
  Administered 2023-04-16: 500 mL via INTRAVENOUS

## 2023-04-16 MED ORDER — POLYVINYL ALCOHOL 1.4 % OP SOLN
1.0000 [drp] | OPHTHALMIC | Status: DC | PRN
  Filled 2023-04-16: qty 15

## 2023-04-16 MED ORDER — CEPHALEXIN 500 MG PO CAPS
500.0000 mg | ORAL_CAPSULE | Freq: Two times a day (BID) | ORAL | Status: DC
  Administered 2023-04-16 – 2023-04-18 (×5): 500 mg via ORAL
  Filled 2023-04-16 (×5): qty 1

## 2023-04-16 MED ORDER — ONDANSETRON HCL 4 MG/2ML IJ SOLN
4.0000 mg | Freq: Four times a day (QID) | INTRAMUSCULAR | Status: DC | PRN
  Administered 2023-04-16: 4 mg via INTRAVENOUS
  Filled 2023-04-16: qty 2

## 2023-04-16 MED ORDER — EYE WASH OP SOLN: 1.0000 [drp] | OPHTHALMIC | Status: DC | PRN

## 2023-04-16 NOTE — Progress Notes (Signed)
PROGRESS NOTE  Samantha [name]  QIO:[id number] DOB: 01-Feb-1951 DOA: 04/14/2023 PCP: Hoy Register, MD   Brief Narrative: Patient is a 72 year old female with history of peripheral artery disease, hypertension, insulin-dependent diabetes type 2, CKD stage IIIb, CVA, anxiety, depression who presented with altered mental status, feeling unwell, chills..  Patient speaks Spanish.  Her daughter found her difficulty to wake up.  EMS saw her and found to be hypoglycemic.  On presentation, she was febrile, hypertensive.  Lab work showed leukocytosis.  Creatinine was 2.88, baseline creatinine around 1.5.  Ammonia level normal.  UA showed moderate leukocytes, negative nitrate, many bacteria.  CT abdomen/pelvis showed symmetrical perinephric stranding, no hydronephrosis or ureteral stones.  Patient was admitted for the management of UTI, AKI and possible pyonephritis.  Assessment & Plan:  Principal Problem:   Sepsis secondary to UTI Active Problems:   Essential hypertension   Type 2 diabetes mellitus with complication, with long-term current use of insulin   Pyelonephritis   AKI (acute kidney injury)   History of CVA (cerebrovascular accident)   Acute metabolic encephalopathy   Hypoglycemia  Sepsis secondary to UTI/ +/-pyonephritis,:Presented with fever, leukocytosis.  UA suspicious for UTI.CT abdomen/pelvis showed symmetrical  perinephric stranding, no hydronephrosis or ureteral stones.  She does not have costovertebral tenderness but complained of lower abdominal discomfort. started on ceftriaxone.  Urine cultures for pansensitive E. coli.  Antibiotics changed to cefadroxil  AKI on CKD stage IIIb: Baseline creatinine 1.5.  Presented with urine in the range of 2.8. AKI thought to be most likely from decreased oral intake.  Started on IV fluids but not much improvement. IV fluids stopped after finding of some congestion in the lungs Avoid nephrotoxins.  I have reached out to nephrology.  She needs  nephrology referral as an outpatient  Diabetes type 2/hypoglycemia: Found to be hypoglycemic at home.  Takes hemoglobin 70/30, metformin at home.  Continue sliding scale.  Monitor blood sugars.  A1c of 7.7  Acute metabolic encephalopathy: Most likely secondary to hypoglycemia.  Currently alert and oriented.  History of CVA: On Plavix  Hypertension: on amlodipine, Coreg.  ARB inhibitor on hold due to AKI.  Remains hypertensive.  Continue prn medications for severe hypertension.  Added hydralazine  Generalized weakness: PT consulted,no follow up recommended        DVT prophylaxis:enoxaparin (LOVENOX) injection 30 mg Start: 04/15/23 1000     Code Status: Full Code  Family Communication: Discussed with daughter at bedside on 4/19  Patient status: Inpatient  Patient is from : Home  Anticipated discharge to: Home  Estimated DC date: tomorrow if kidney function improves   Consultants: None  Procedures: None  Antimicrobials:  Anti-infectives (From admission, onward)    Start     Dose/Rate Route Frequency Ordered Stop   04/16/23 1215  cephALEXin (KEFLEX) capsule 500 mg        500 mg Oral Every 12 hours 04/16/23 1124 04/21/23 0959   04/15/23 2200  cefTRIAXone (ROCEPHIN) 1 g in sodium chloride 0.9 % 100 mL IVPB  Status:  Discontinued        1 g 200 mL/hr over 30 Minutes Intravenous Every 24 hours 04/14/23 2325 04/15/23 0821   04/15/23 2200  cefTRIAXone (ROCEPHIN) 2 g in sodium chloride 0.9 % 100 mL IVPB  Status:  Discontinued        2 g 200 mL/hr over 30 Minutes Intravenous Every 24 hours 04/15/23 0821 04/16/23 1124   04/14/23 2115  cefTRIAXone (ROCEPHIN) 1 g in sodium chloride 0.9 %  100 mL IVPB        1 g 200 mL/hr over 30 Minutes Intravenous  Once 04/14/23 2103 04/14/23 2222       Subjective: Patient seen and examined at bedside today.  Hemodynamically stable.  She looks comfortable than yesterday.  She denies any lower abdominal discomfort.  Complains of some cough.   She does not have leg edema.  Daughter at the bedside.  She complains of some shortness of breath on ambulation  Objective: Vitals:   04/15/23 0827 04/15/23 1133 04/15/23 2000 04/16/23 0513  BP: (!) 147/61 (!) 147/66 (!) 157/59 (!) 165/74  Pulse: 70 68 69 76  Resp: 18 19 18 18   Temp: 99.1 F (37.3 C) 99.1 F (37.3 C) 98.5 F (36.9 C) 98.2 F (36.8 C)  TempSrc: Oral Oral  Oral  SpO2: 97% 97% 91% 92%    Intake/Output Summary (Last 24 hours) at 04/16/2023 1202 Last data filed at 04/16/2023 1023 Gross per 24 hour  Intake 690 ml  Output 300 ml  Net 390 ml   There were no vitals filed for this visit.  Examination:  General exam: Overall comfortable, not in distress HEENT: PERRL Respiratory system: Few crackles on bilateral bases Cardiovascular system: S1 & S2 heard, RRR.  Gastrointestinal system: Abdomen is nondistended, soft and nontender. Central nervous system: Alert and oriented Extremities: No edema, no clubbing ,no cyanosis Skin: No rashes, no ulcers,no icterus     Data Reviewed: I have personally reviewed following labs and imaging studies  CBC: Recent Labs  Lab 04/14/23 1715 04/15/23 0549 04/16/23 0505  WBC 12.0* 12.4* 9.9  NEUTROABS 10.0*  --   --   HGB 12.1 11.2* 10.7*  HCT 36.3 33.0* 32.4*  MCV 93.3 93.0 95.3  PLT 324 280 260   Basic Metabolic Panel: Recent Labs  Lab 04/12/23 1702 04/14/23 1715 04/15/23 0549 04/16/23 0505  NA 140 136 136 134*  K 4.6 3.6 3.6 3.8  CL 103 106 109 108  CO2 19* 20* 18* 19*  GLUCOSE 246* 95 84 122*  BUN 39* 36* 35* 36*  CREATININE 2.56* 2.88* 2.74* 2.64*  CALCIUM 9.3 8.4* 8.1* 8.2*     Recent Results (from the past 240 hour(s))  Resp panel by RT-PCR (RSV, Flu A&B, Covid) Anterior Nasal Swab     Status: None   Collection Time: 04/14/23  5:30 PM   Specimen: Anterior Nasal Swab  Result Value Ref Range Status   SARS Coronavirus 2 by RT PCR NEGATIVE NEGATIVE Final    Comment: (NOTE) SARS-CoV-2 target nucleic acids  are NOT DETECTED.  The SARS-CoV-2 RNA is generally detectable in upper respiratory specimens during the acute phase of infection. The lowest concentration of SARS-CoV-2 viral copies this assay can detect is 138 copies/mL. A negative result does not preclude SARS-Cov-2 infection and should not be used as the sole basis for treatment or other patient management decisions. A negative result may occur with  improper specimen collection/handling, submission of specimen other than nasopharyngeal swab, presence of viral mutation(s) within the areas targeted by this assay, and inadequate number of viral copies(<138 copies/mL). A negative result must be combined with clinical observations, patient history, and epidemiological information. The expected result is Negative.  Fact Sheet for Patients:  BloggerCourse.com  Fact Sheet for Healthcare Providers:  SeriousBroker.it  This test is no t yet approved or cleared by the [place] FDA and  has been authorized for detection and/or diagnosis of SARS-CoV-2 by FDA under an Emergency Use Authorization (  EUA). This EUA will remain  in effect (meaning this test can be used) for the duration of the COVID-19 declaration under Section 564(b)(1) of the Act, 21 U.S.C.section 360bbb-3(b)(1), unless the authorization is terminated  or revoked sooner.       Influenza A by PCR NEGATIVE NEGATIVE Final   Influenza B by PCR NEGATIVE NEGATIVE Final    Comment: (NOTE) The Xpert Xpress SARS-CoV-2/FLU/RSV plus assay is intended as an aid in the diagnosis of influenza from Nasopharyngeal swab specimens and should not be used as a sole basis for treatment. Nasal washings and aspirates are unacceptable for Xpert Xpress SARS-CoV-2/FLU/RSV testing.  Fact Sheet for Patients: BloggerCourse.com  Fact Sheet for Healthcare Providers: SeriousBroker.it  This test is  not yet approved or cleared by the [place] FDA and has been authorized for detection and/or diagnosis of SARS-CoV-2 by FDA under an Emergency Use Authorization (EUA). This EUA will remain in effect (meaning this test can be used) for the duration of the COVID-19 declaration under Section 564(b)(1) of the Act, 21 U.S.C. section 360bbb-3(b)(1), unless the authorization is terminated or revoked.     Resp Syncytial Virus by PCR NEGATIVE NEGATIVE Final    Comment: (NOTE) Fact Sheet for Patients: BloggerCourse.com  Fact Sheet for Healthcare Providers: SeriousBroker.it  This test is not yet approved or cleared by the [place] FDA and has been authorized for detection and/or diagnosis of SARS-CoV-2 by FDA under an Emergency Use Authorization (EUA). This EUA will remain in effect (meaning this test can be used) for the duration of the COVID-19 declaration under Section 564(b)(1) of the Act, 21 U.S.C. section 360bbb-3(b)(1), unless the authorization is terminated or revoked.  Performed at [facility], [address] [place], [place] [address]   Urine Culture     Status: Abnormal   Collection Time: 04/14/23  9:10 PM   Specimen: Urine, Clean Catch  Result Value Ref Range Status   Specimen Description   Final    URINE, CLEAN CATCH Performed at [facility], [address], [place], [place] [address]    Special Requests   Final    NONE Performed at [facility], [address], [place], [place] [address]    Culture >=100,000 COLONIES/mL ESCHERICHIA COLI (A)  Final   Report Status 04/16/2023 FINAL  Final   Organism ID, Bacteria ESCHERICHIA COLI (A)  Final      Susceptibility   Escherichia coli - MIC*    AMPICILLIN 4 SENSITIVE Sensitive     CEFAZOLIN <=4 SENSITIVE Sensitive     CEFEPIME <=0.12 SENSITIVE Sensitive     CEFTRIAXONE <=0.25 SENSITIVE Sensitive      CIPROFLOXACIN <=0.25 SENSITIVE Sensitive     GENTAMICIN <=1 SENSITIVE Sensitive     IMIPENEM <=0.25 SENSITIVE Sensitive     NITROFURANTOIN <=16 SENSITIVE Sensitive     TRIMETH/SULFA <=20 SENSITIVE Sensitive     AMPICILLIN/SULBACTAM <=2 SENSITIVE Sensitive     PIP/TAZO <=4 SENSITIVE Sensitive     * >=100,000 COLONIES/mL ESCHERICHIA COLI  Culture, blood (Routine X 2) w Reflex to ID Panel     Status: None (Preliminary result)   Collection Time: 04/15/23  8:34 AM   Specimen: BLOOD RIGHT HAND  Result Value Ref Range Status   Specimen Description   Final    BLOOD RIGHT HAND Performed at [facility], [address] [place], [place] [address]    Special Requests   Final    BOTTLES DRAWN AEROBIC ONLY  Blood Culture adequate volume Performed at [facility], [address], [place], [place] [address]    Culture   Final    NO GROWTH < 24 HOURS Performed at [facility], [address] [place], [place] [address]    Report Status PENDING  Incomplete  Culture, blood (Routine X 2) w Reflex to ID Panel     Status: None (Preliminary result)   Collection Time: 04/15/23  8:34 AM   Specimen: BLOOD RIGHT ARM  Result Value Ref Range Status   Specimen Description   Final    BLOOD RIGHT ARM Performed at [facility], [address], [place], [place] [address]    Special Requests   Final    AEROBIC BOTTLE ONLY Blood Culture results may not be optimal due to an inadequate volume of blood received in culture bottles Performed at [facility], [address], [place], [place] [address]    Culture   Final    NO GROWTH < 24 HOURS Performed at [facility], [address] [place], [place] [address]    Report Status PENDING  Incomplete     Radiology Studies: DG CHEST PORT 1 VIEW  Result Date: 04/16/2023 CLINICAL DATA:  Cough. EXAM: PORTABLE CHEST 1 VIEW COMPARISON:  04/14/2023 FINDINGS: Lungs are somewhat  hypoinflated demonstrate hazy prominence of the central pulmonary vessels likely representing mild vascular congestion. Slight increased density in the right infrahilar region could be due to superimposed airspace process. No definite effusion. No pneumothorax. Mild stable cardiomegaly. Remainder of the exam is unchanged. IMPRESSION: 1. Slight increased density in the right infrahilar region which could be due to superimposed airspace process such as atelectasis or infection. Consider PA and lateral chest radiograph for better evaluation. 2. Mild stable cardiomegaly with suggestion of mild vascular congestion. Electronically Signed   By: Elberta Fortis M.D.   On: 04/16/2023 10:41   CT CHEST ABDOMEN PELVIS WO CONTRAST  Result Date: 04/14/2023 CLINICAL DATA:  Abnormal chest x-ray, and left lower quadrant abdominal pain. EXAM: CT CHEST, ABDOMEN AND PELVIS WITHOUT CONTRAST TECHNIQUE: Multidetector CT imaging of the chest, abdomen and pelvis was performed following the standard protocol without IV contrast. RADIATION DOSE REDUCTION: This exam was performed according to the departmental dose-optimization program which includes automated exposure control, adjustment of the mA and/or kV according to patient size and/or use of iterative reconstruction technique. COMPARISON:  Portable chest today, portable chest 01/10/2015. No prior body CT for comparison. FINDINGS: CT CHEST FINDINGS Cardiovascular: The heart is upper limit of normal in size. There is no pericardial effusion. There are calcifications in the left main, lad and circumflex coronary arteries. The pulmonary arteries and veins are normal caliber. There are moderate patchy calcifications in the arch and descending aorta and proximal left subclavian artery, and a two-vessel aortic arch with normal variant brachiobicarotid trunk. No aortic aneurysm is seen, with mild tortuosity in the descending segment. There is suspected at least 60-70% calcific stenosis in the  proximal 1 cm of the left subclavian artery. Mediastinum/Nodes: Thyroid gland, axillary spaces are unremarkable. There are slightly prominent right of midline precarinal, and subcarinal lymph nodes up to 1 cm in short axis, similar sized single right mid hilar lymph node, but there is no further adenopathy. There is a small hiatal hernia with unremarkable thoracic esophagus. No abnormality is seen within the thoracic trachea and main bronchi apart from mucoid septations. There are also tracheobronchial chondral calcifications. Lungs/Pleura: There is diffuse  bronchial thickening. No bronchial plugging is seen. There is subpleural reticulation in the mid to lower lung fields, without honeycombing, findings consistent with mild interstitial lung disease. There are mild atelectatic changes of the posterior lungs, without evidence of focal pneumonia. No lung nodules are seen. No pleural effusion, thickening or pneumothorax. Musculoskeletal: There are degenerative changes of the spine and shoulders. No acute or significant osseous findings or chest wall mass. CT ABDOMEN PELVIS FINDINGS Hepatobiliary: There are vascular calcifications at the hepatic hilum and a few tiny calcified granulomas in the liver. The liver is otherwise unremarkable. There is mild dilatation of the gallbladder and a few small stones in the proximal lumen, but no appreciable wall thickening, pericholecystic edema or biliary dilatation. Pancreas: There is mild glandular atrophy. No focal mass is seen without contrast. There are either scattered vascular calcifications in the parenchyma or calcifications of chronic pancreatitis, but no acute inflammation is evident. Spleen: There are calcified granulomas. Patchy heavy calcification splenic artery. No splenomegaly or focal mass is seen without contrast. Adrenals/Urinary Tract: There is no adrenal mass. There is no focal abnormality of the unenhanced kidneys. Perinephric stranding symmetric and likely on  a senescent basis. There are renovascular calcifications extending along both renal hila. No collecting system stones are seen and no hydronephrosis or ureteral stones. The bladder is contracted and not well seen but no adjacent inflammatory reaction or intravesical stones are noted. Stomach/Bowel: No dilatation or wall thickening including the appendix. There is sigmoid diverticulosis but no findings of acute diverticulitis. Mild fecal stasis. Vascular/Lymphatic: There is heavy aortoiliac and visceral branch vessel atherosclerosis. No AAA. No abdominopelvic adenopathy. Reproductive: Status post hysterectomy. No adnexal masses. Other: There is a small umbilical fat hernia. There is no incarcerated hernia. There is no free fluid, free hemorrhage or free air. Musculoskeletal: Mild osteopenia and degenerative change lumbar spine, spurring of the SI joints, and bridging osteophytes over the left inferior SI joint. No acute or other significant osseous findings. IMPRESSION: 1. Bronchitis without evidence of pneumonia. 2. Subpleural reticulation in the mid to lower lung fields consistent with mild interstitial lung disease. 3. Aortic and coronary artery atherosclerosis. Visceral branch vessel atherosclerosis in the abdomen. 4. 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 5. Small hiatal hernia. 6. Cholelithiasis with mild gallbladder dilatation but no wall thickening or pericholecystic edema. 7. Scattered tiny calcifications in the pancreas, could be vascular or due to chronic calcific pancreatitis. No acute peripancreatic edema. 8. Constipation and diverticulosis. No bowel obstruction or inflammation. 9. Umbilical fat hernia. 10. Osteopenia and degenerative change. Electronically Signed   By: Almira Bar M.D.   On: 04/14/2023 20:28   CT Head Wo Contrast  Result Date: 04/14/2023 CLINICAL DATA:  Delirium. EXAM: CT HEAD WITHOUT CONTRAST CT CERVICAL SPINE WITHOUT CONTRAST TECHNIQUE:  Multidetector CT imaging of the head and cervical spine was performed following the standard protocol without intravenous contrast. Multiplanar CT image reconstructions of the cervical spine were also generated. RADIATION DOSE REDUCTION: This exam was performed according to the departmental dose-optimization program which includes automated exposure control, adjustment of the mA and/or kV according to patient size and/or use of iterative reconstruction technique. COMPARISON:  Head CT dated 11/08/2021. FINDINGS: CT HEAD FINDINGS Brain: The ventricles and sulci appropriate size for patient's age. Small area of infarct in the left cerebellar hemisphere, small left cerebellar old infarct. Subcentimeter right basal ganglia hypodense focus, likely a small old lacunar infarct. There is no acute intracranial hemorrhage. No mass effect or midline  shift. No extra-axial fluid collection. Vascular: No hyperdense vessel or unexpected calcification. Skull: Normal. Negative for fracture or focal lesion. Sinuses/Orbits: Diffuse mucoperiosteal thickening of paranasal sinuses with partial opacification of maxillary sinuses. The mastoid air cells are clear. Other: None CT CERVICAL SPINE FINDINGS Alignment: No acute subluxation. Skull base and vertebrae: No acute fracture. Soft tissues and spinal canal: No prevertebral fluid or swelling. No visible canal hematoma. Disc levels:  No acute findings.  Degenerative changes. Upper chest: Negative. Other: Bilateral carotid bulb calcified plaques. IMPRESSION: 1. No acute intracranial pathology. 2. 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 3. No acute/traumatic cervical spine pathology. 4. Paranasal sinus disease. Electronically Signed   By: Elgie Collard M.D.   On: 04/14/2023 20:04   CT CERVICAL SPINE WO CONTRAST  Result Date: 04/14/2023 CLINICAL DATA:  Delirium. EXAM: CT HEAD WITHOUT CONTRAST CT CERVICAL SPINE WITHOUT CONTRAST TECHNIQUE: Multidetector CT imaging of the head  and cervical spine was performed following the standard protocol without intravenous contrast. Multiplanar CT image reconstructions of the cervical spine were also generated. RADIATION DOSE REDUCTION: This exam was performed according to the departmental dose-optimization program which includes automated exposure control, adjustment of the mA and/or kV according to patient size and/or use of iterative reconstruction technique. COMPARISON:  Head CT dated 11/08/2021. FINDINGS: CT HEAD FINDINGS Brain: The ventricles and sulci appropriate size for patient's age. Small area of infarct in the left cerebellar hemisphere, small left cerebellar old infarct. Subcentimeter right basal ganglia hypodense focus, likely a small old lacunar infarct. There is no acute intracranial hemorrhage. No mass effect or midline shift. No extra-axial fluid collection. Vascular: No hyperdense vessel or unexpected calcification. Skull: Normal. Negative for fracture or focal lesion. Sinuses/Orbits: Diffuse mucoperiosteal thickening of paranasal sinuses with partial opacification of maxillary sinuses. The mastoid air cells are clear. Other: None CT CERVICAL SPINE FINDINGS Alignment: No acute subluxation. Skull base and vertebrae: No acute fracture. Soft tissues and spinal canal: No prevertebral fluid or swelling. No visible canal hematoma. Disc levels:  No acute findings.  Degenerative changes. Upper chest: Negative. Other: Bilateral carotid bulb calcified plaques. IMPRESSION: 1. No acute intracranial pathology. 2. 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 3. No acute/traumatic cervical spine pathology. 4. Paranasal sinus disease. Electronically Signed   By: Elgie Collard M.D.   On: 04/14/2023 20:04   DG Chest Portable 1 View  Result Date: 04/14/2023 CLINICAL DATA:  Chest pain. EXAM: PORTABLE CHEST 1 VIEW COMPARISON:  Chest x-ray 01/10/2015. FINDINGS: Low lung volumes. Streaky left basilar opacities. No visible pleural effusions  or pneumothorax. No acute osseous abnormality. Cardiac silhouette is accentuated by technique and enlarged. IMPRESSION: Low lung volumes with streaky left basilar opacities, most likely atelectasis. Dedicated PA and lateral radiographs could better assess if clinically warranted. Electronically Signed   By: Feliberto Harts M.D.   On: 04/14/2023 18:23    Scheduled Meds:  amLODipine  10 mg Oral Daily   atorvastatin  80 mg Oral Daily   carvedilol  25 mg Oral BID WC   cephALEXin  500 mg Oral Q12H   clopidogrel  75 mg Oral Daily   enoxaparin (LOVENOX) injection  30 mg Subcutaneous Q24H   guaiFENesin  600 mg Oral BID   hydrALAZINE  25 mg Oral Q8H   insulin aspart  0-5 Units Subcutaneous QHS   insulin aspart  0-9 Units Subcutaneous TID WC   sodium bicarbonate  650 mg Oral TID   Continuous Infusions:     LOS: 2  days   Burnadette Pop, MD [facility] P4/19/2024, 12:02 PM

## 2023-04-16 NOTE — Consult Note (Addendum)
Renal Service Consult Note [facility]  Samantha [name] 04/16/2023 Samantha Krabbe, MD Requesting Physician: Dr. Renford Dills  Reason for Consult: Renal failure HPI: The patient is a 72 y.o. year-old w/ PMH as below who presented to ED on 4/17 w/ AMS, chills, not feeling well. EMS found low BS. In ED pt was febrile w/ high Bp's. WBC up, creat 2.8 (b/l 1.5), NH3 wnl and UA showed +LE and bacteria. CT abd showed perinephric stranding, no hydro. Pt was admitted for UTI w/ AKI and pyelonephritis. Pt rec'd 1 L NS on 4/17 and IVF"s overnight. IV abx were given. Home bp meds were continued w/ coreg and norvasc. Creat improved minimally to 2.7 yesterday and 2.6 today. We are asked to see for renal failure.   Pt seen in room. BP's have been high to normal here. Home ARB was held on admission. Pt seen in room. Pt denies any SOB over the last couple of days. No orthopnea, no leg swelling. Does have a cough which is dry. She has not been told she has any kidney problems in her past.   ROS - denies CP, no joint pain, no HA, no blurry vision, no rash, no diarrhea, no nausea/ vomiting, no dysuria, no difficulty voiding   Past Medical History  Past Medical History:  Diagnosis Date   Acute pyelonephritis    Bacteremia, escherichia coli 01/12/2015   Chest pain    Critical lower limb ischemia    Hyperlipidemia    Hypertension    Left carotid bruit    PAD (peripheral artery disease)    Type II diabetes mellitus    Past Surgical History  Past Surgical History:  Procedure Laterality Date   ABDOMINAL ANGIOGRAM  05/16/2015   Procedure: Abdominal Angiogram;  Surgeon: Runell Gess, MD;  Location: [facility];  Service: Cardiovascular;;   ABDOMINAL HYSTERECTOMY  ~ 2000   PERIPHERAL VASCULAR CATHETERIZATION Bilateral 05/16/2015   Procedure: Lower Extremity Angiography;  Surgeon: Runell Gess, MD; renal arteries widely patent, L-SFA 75%, L-pop 95%, L-peroneal 90%; R-SFA 40%, R-pop  50%, R-prox peroneal 90%; directional atherectomy L-SFA, p-pop, drug-eluting balloon angioplasty reducing the stenoses to 0, small linear dissection was not flow limiting       PERIPHERAL VASCULAR CATHETERIZATION Left 05/16/2015   Procedure: Peripheral Vascular Atherectomy;  Surgeon: Runell Gess, MD;  Location: [facility] CV [facility];  Service: Cardiovascular;  Laterality: Left;  sfa   Family History  Family History  Problem Relation Age of Onset   Hypertension Mother    Social History  reports that she has never smoked. She has never used smokeless tobacco. She reports current alcohol use of about 1.0 - 2.0 standard drink of alcohol per week. She reports that she does not use drugs. Allergies  Allergies  Allergen Reactions   Perflutren Other (See Comments)    Pt with back pain as soon as administered.   Home medications Prior to Admission medications   Medication Sig Start Date End Date Taking? Authorizing Provider  ibuprofen (ADVIL) 200 MG tablet Take 200 mg by mouth as needed for moderate pain.   Yes [provider]  insulin isophane & regular human KwikPen (HUMULIN 70/30 KWIKPEN) (70-30) 100 UNIT/ML KwikPen Inject 30 Units into the skin in the morning AND 25 Units every evening. Patient taking differently: Inject 30 Units into the skin in the morning AND 26 Units every evening. 04/12/23  Yes Hoy Register, MD  metFORMIN (GLUCOPHAGE) 1000 MG tablet Take 1 tablet (  1,000 mg total) by mouth 2 (two) times daily with a meal. Patient taking differently: Take 1,000 mg by mouth daily. 04/12/23  Yes Hoy Register, MD  amLODipine (NORVASC) 10 MG tablet Take 1 tablet (10 mg total) by mouth daily. 04/12/23   Hoy Register, MD  atorvastatin (LIPITOR) 80 MG tablet Take 1 tablet (80 mg total) by mouth daily. Patient not taking: Reported on 04/15/2023 04/12/23   Hoy Register, MD  carvedilol (COREG) 25 MG tablet Take 1 tablet (25 mg total) by mouth 2 (two) times daily with a meal. 04/12/23    Hoy Register, MD  clopidogrel (PLAVIX) 75 MG tablet Take 1 tablet (75 mg total) by mouth daily. Patient not taking: Reported on 04/15/2023 04/12/23   Hoy Register, MD  glimepiride (AMARYL) 4 MG tablet Take 1 tablet (4 mg total) by mouth daily with breakfast. Patient not taking: Reported on 04/15/2023 04/12/23   Hoy Register, MD  glucose blood test strip Use 3 times daily before meals 07/21/18   Hoy Register, MD  Insulin Syringe-Needle U-100 (BD INSULIN SYRINGE ULTRAFINE) 31G X 15/64" 0.5 ML MISC Use subcutaneously twice daily 01/28/17   Hoy Register, MD  TRUEPLUS LANCETS 28G MISC 1 each by Does not apply route 3 (three) times daily before meals. 12/23/16   Hoy Register, MD  valsartan (DIOVAN) 320 MG tablet Take 1 tablet (320 mg total) by mouth daily. 04/12/23   Hoy Register, MD     Vitals:   04/15/23 1133 04/15/23 2000 04/16/23 0513 04/16/23 1230  BP: (!) 147/66 (!) 157/59 (!) 165/74 (!) 168/65  Pulse: 68 69 76 71  Resp: Temp: 99.1 F (37.3 C) 98.5 F (36.9 C) 98.2 F (36.8 C) 98.6 F (37 C)  TempSrc: Oral  Oral Oral  SpO2: 97% 91% 92% 93%   Exam Gen alert, no distress, sitting up in bed, on room air Calm and pleasant  No rash, cyanosis or gangrene Sclera anicteric, throat clear  No jvd or bruits, flat neck veins Chest mild bibasilar crackles, no wheezing, no ^wob RRR no MRG Abd soft ntnd no mass or ascites +bs GU defer MS no joint effusions or deformity Ext no LE or UE edema, no wounds or ulcers Neuro is alert, Ox 3 , nf    Home meds include - ibuprofen, insulin  70-30, metformin, norvasc 10, lipitor, coreg 25 bid, plavix, amaryl, valsartan 320 qd, prns    Date   Creat  eGFR   2016- 2017  0.97- 1.13   2018- 2019  1.12- 1.37 41- 52 ml/min   Nov 2022  1.57- 1.72 32- 36 ml/min    4/15   2.56  20 ml/min    4/17   2.88   4/18   2.74   04/16/23  2.64  19 ml/min    UA 4/17 - prot >300, many bact, wbc 21-50, rbc 0-5    UNa 67    CT abd noncon  4.17 --> Urinary Tract: there is no focal abnormality of the unenhanced kidneys. Kidneys appear to be on the smaller side. Perinephric stranding bilaterally There are renovascular calcifications extending along both renal hila. No collecting system stones are seen and no hydronephrosis or ureteral stones. The bladder is contracted and not well seen but no adjacent inflammatory reaction or intravesical stones are noted.       BPs 160s- 70s, HR 70s RR 18 temp 98.6  RA 93%       UOP today = 300 cc  Assessment/ Plan: AKI on CKD 3b - b/l creat 1.5- 1.7 from nov 2022. Creat here was 2.56 on admit in setting of sepsis, prob UTI/ pyelonephritis w/ CT showing bilat perirenal stranding. CT showed no obstruction and UA was c/w UTI. Pt rec'd 1 L bolus in ED and the IVF's which were stopped though due to concern for fluid in the lungs by CXR. Pt was on ARB and nsaid's at home. On exam no jvd or edema, mild rales at the bases. No SOB. Suspect AKI due to vol depletion+ home ARB/ nsaids, vs progression of CKD since 2022 to new baseline of CKD 4. Will give cautious IVF's and see if creat improves any. Cont holding ARB for now. Will follow.   Sepsis/ UTI - possible pyelonephritis, UCx grew EColi, getting appropriate IV abx.  DM2 - per pmd H/o CVA HTN - on coreg and norvasc. ARB on hold.       Samantha Moselle  MD [facility] 04/16/2023, 1:42 PM  Recent Labs  Lab 04/12/23 1702 04/12/23 1702 04/14/23 1715 04/15/23 0549 04/16/23 0505  HGB  --    < > 12.1 11.2* 10.7*  ALBUMIN 3.9  --  3.4*  --   --   CALCIUM 9.3  --  8.4* 8.1* 8.2*  CREATININE 2.56*  --  2.88* 2.74* 2.64*  K 4.6  --  3.6 3.6 3.8   < > = values in this interval not displayed.   Inpatient medications:  amLODipine  10 mg Oral Daily   atorvastatin  80 mg Oral Daily   carvedilol  25 mg Oral BID WC   cephALEXin  500 mg Oral Q12H   clopidogrel  75 mg Oral Daily   enoxaparin (LOVENOX) injection  30 mg Subcutaneous Q24H   guaiFENesin  600 mg  Oral BID   hydrALAZINE  25 mg Oral Q8H   insulin aspart  0-5 Units Subcutaneous QHS   insulin aspart  0-9 Units Subcutaneous TID WC   sodium bicarbonate  650 mg Oral TID    acetaminophen, guaiFENesin-dextromethorphan, labetalol, melatonin, polyvinyl alcohol

## 2023-04-16 NOTE — Progress Notes (Signed)
Mobility Specialist - Progress Note   04/16/23 0800  Mobility  Activity Ambulated with assistance to bathroom  Level of Assistance Standby assist, set-up cues, supervision of patient - no hands on  Assistive Device Front wheel walker  Distance Ambulated (ft) 20 ft  Activity Response Tolerated well  Mobility Referral Yes  $Mobility charge 1 Mobility   Pt received in bed and requested assistance to bathroom. Mod I for bed mobility and sit to stand, pt went to restroom and returned to bed with all needs met and alarm back on.   Marilynne Halsted Mobility Specialist

## 2023-04-16 NOTE — Evaluation (Signed)
Physical Therapy Evaluation Patient Details Name: Samantha [name] MRN: [id number] DOB: [birth year] Today's Date: 04/16/2023  History of Present Illness  72 year old female with history of peripheral artery disease, hypertension, insulin-dependent diabetes type 2, CKD stage IIIb, CVA, anxiety, depression who presented with altered mental status, feeling unwell, chills..  Patient speaks Spanish.  Her daughter found her difficulty to wake up.  EMS saw her and found to be hypoglycemic.  On presentation, she was febrile, hypertensive.  Lab work showed leukocytosis.  Creatinine was 2.88, baseline creatinine around 1.5.  Ammonia level normal.  UA showed moderate leukocytes, negative nitrate, many bacteria.  CT abdomen/pelvis showed symmetrical perinephric stranding, no hydronephrosis or ureteral stones.  Patient was admitted for the management of UTI, AKI and possible pyonephritis.  Clinical Impression  Pt ambulated 220' with RW without loss of balance. At baseline she ambulates without an assistive device and is independent with ADLs. She reported feeling mildly unsteady when walking without a RW today. Pt was advised to use RW for now, until she regains her baseline strength. Pt was encouraged to walk in the hall TID with family assisting with IV pole to minimize deconditioning during hospitalization. No further PT indicated, will sign off, mobility team following.         Recommendations for follow up therapy are one component of a multi-disciplinary discharge planning process, led by the attending physician.  Recommendations may be updated based on patient status, additional functional criteria and insurance authorization.  Follow Up Recommendations       Assistance Recommended at Discharge None  Patient can return home with the following       Equipment Recommendations None recommended by PT  Recommendations for Other Services       Functional Status Assessment Patient has not had a recent  decline in their functional status     Precautions / Restrictions Precautions Precautions: Fall Precaution Comments: denies falls in past 6 months Restrictions Weight Bearing Restrictions: No      Mobility  Bed Mobility Overal bed mobility: Modified Independent             General bed mobility comments: HOB up, used rail    Transfers Overall transfer level: Independent                      Ambulation/Gait Ambulation/Gait assistance: Modified independent (Device/Increase time) Gait Distance (Feet): 220 Feet Assistive device: Rolling walker (2 wheels) Gait Pattern/deviations: WFL(Within Functional Limits) Gait velocity: decr     General Gait Details: pt felt mildly unsteady without AD so used RW, no loss of balance with RW; recommended pt ambulate TID with RW with family to minimize deconditioning. No dyspnea  Stairs            Wheelchair Mobility    Modified Rankin (Stroke Patients Only)       Balance Overall balance assessment: Modified Independent                                           Pertinent Vitals/Pain Pain Assessment Pain Assessment: No/denies pain    Home Living Family/patient expects to be discharged to:: Private residence Living Arrangements: Children   Type of Home: House Home Access: Stairs to enter Entrance Stairs-Rails: Left Entrance Stairs-Number of Steps: 3   Home Layout: One level Home Equipment: Agricultural consultant (2 wheels) Additional Comments: lives with daughter  Prior Function Prior Level of Function : Independent/Modified Independent             Mobility Comments: walks without AD, no falls in 6 months ADLs Comments: independent     Hand Dominance        Extremity/Trunk Assessment   Upper Extremity Assessment Upper Extremity Assessment: Overall WFL for tasks assessed    Lower Extremity Assessment Lower Extremity Assessment: Overall WFL for tasks assessed    Cervical /  Trunk Assessment Cervical / Trunk Assessment: Normal  Communication   Communication: Prefers language other than [place] (daughter interpreted)  Cognition Arousal/Alertness: Awake/alert Behavior During Therapy: WFL for tasks assessed/performed Overall Cognitive Status: Within Functional Limits for tasks assessed                                          General Comments      Exercises     Assessment/Plan    PT Assessment Patient does not need any further PT services  PT Problem List         PT Treatment Interventions      PT Goals (Current goals can be found in the Care Plan section)  Acute Rehab PT Goals PT Goal Formulation: All assessment and education complete, DC therapy    Frequency       Co-evaluation               AM-PAC PT "6 Clicks" Mobility  Outcome Measure Help needed turning from your back to your side while in a flat bed without using bedrails?: None Help needed moving from lying on your back to sitting on the side of a flat bed without using bedrails?: None Help needed moving to and from a bed to a chair (including a wheelchair)?: None Help needed standing up from a chair using your arms (e.g., wheelchair or bedside chair)?: None Help needed to walk in hospital room?: None Help needed climbing 3-5 steps with a railing? : None 6 Click Score: 24    End of Session   Activity Tolerance: Patient tolerated treatment well Patient left: in chair;with call bell/phone within reach;with family/visitor present;with nursing/sitter in room Nurse Communication: Mobility status      Time: 1011-1029 PT Time Calculation (min) (ACUTE ONLY): 18 min   Charges:   PT Evaluation $PT Eval Low Complexity: 1 Low          Tamala Ser PT 04/16/2023  Acute Rehabilitation Services  Office [phone]

## 2023-04-17 ENCOUNTER — Inpatient Hospital Stay (HOSPITAL_COMMUNITY): Payer: Medicaid Other

## 2023-04-17 LAB — GLUCOSE, CAPILLARY
Glucose-Capillary: 168 mg/dL — ABNORMAL HIGH (ref 70–99)
Glucose-Capillary: 209 mg/dL — ABNORMAL HIGH (ref 70–99)
Glucose-Capillary: 220 mg/dL — ABNORMAL HIGH (ref 70–99)
Glucose-Capillary: 168 mg/dL — ABNORMAL HIGH (ref 70–99)

## 2023-04-17 LAB — BASIC METABOLIC PANEL
Anion gap: 9 (ref 5–15)
BUN: 35 mg/dL — ABNORMAL HIGH (ref 8–23)
CO2: 19 mmol/L — ABNORMAL LOW (ref 22–32)
Calcium: 8 mg/dL — ABNORMAL LOW (ref 8.9–10.3)
Chloride: 105 mmol/L (ref 98–111)
Creatinine, Ser: 2.64 mg/dL — ABNORMAL HIGH (ref 0.44–1.00)
GFR, Estimated: 19 mL/min — ABNORMAL LOW (ref >60)
Glucose, Bld: 169 mg/dL — ABNORMAL HIGH (ref 70–99)
Potassium: 3.8 mmol/L (ref 3.5–5.1)
Sodium: 133 mmol/L — ABNORMAL LOW (ref 135–145)

## 2023-04-17 LAB — CULTURE, BLOOD (ROUTINE X 2)
Culture: NO GROWTH
Special Requests: ADEQUATE

## 2023-04-17 MED ORDER — FUROSEMIDE 10 MG/ML IJ SOLN
80.0000 mg | Freq: Two times a day (BID) | INTRAMUSCULAR | Status: DC
  Administered 2023-04-17 – 2023-04-18 (×2): 80 mg via INTRAVENOUS
  Filled 2023-04-17 (×2): qty 8

## 2023-04-17 MED ORDER — GUAIFENESIN-DM 100-10 MG/5ML PO SYRP
10.0000 mL | ORAL_SOLUTION | Freq: Four times a day (QID) | ORAL | Status: DC
  Administered 2023-04-17 – 2023-04-18 (×5): 10 mL via ORAL
  Filled 2023-04-17 (×5): qty 10

## 2023-04-17 NOTE — Plan of Care (Signed)

## 2023-04-17 NOTE — Progress Notes (Signed)
Daughter no. [phone]

## 2023-04-17 NOTE — Progress Notes (Addendum)
PROGRESS NOTE  Samantha [name]  ZOX:[id number] DOB: Jul 12, 1951 DOA: 04/14/2023 PCP: Hoy Register, MD   Brief Narrative: Patient is a 72 year old female with history of peripheral artery disease, hypertension, insulin-dependent diabetes type 2, CKD stage IIIb, CVA, anxiety, depression who presented with altered mental status, feeling unwell, chills..  Patient speaks Spanish.  Her daughter found her difficulty to wake up.  EMS saw her and found to be hypoglycemic.  On presentation, she was febrile, hypertensive.  Lab work showed leukocytosis.  Creatinine was 2.88, baseline creatinine around 1.5.  Ammonia level normal.  UA showed moderate leukocytes, negative nitrate, many bacteria.  CT abdomen/pelvis showed symmetrical perinephric stranding, no hydronephrosis or ureteral stones.  Patient was admitted for the management of UTI, AKI and possible pyonephritis. Clinically improved but she developed cough on 4/19.  Chest x-ray showed stable of mild pulmonary edema.  Started on Lasix.  Nephrology following  Assessment & Plan:  Principal Problem:   Sepsis secondary to UTI Active Problems:   Essential hypertension   Type 2 diabetes mellitus with complication, with long-term current use of insulin   Pyelonephritis   AKI (acute kidney injury)   History of CVA (cerebrovascular accident)   Acute metabolic encephalopathy   Hypoglycemia  Sepsis secondary to UTI/ +/-pyonephritis,:Presented with fever, leukocytosis.  UA suspicious for UTI.CT abdomen/pelvis showed symmetrical  perinephric stranding, no hydronephrosis or ureteral stones.  She does not have costovertebral tenderness but complained of lower abdominal discomfort. started on ceftriaxone.  Urine cultures for pansensitive E. coli.  Antibiotics changed to keflex  AKI on CKD stage IIIb: Baseline creatinine 1.5.  Presented with urine in the range of 2.8. AKI thought to be most likely from decreased oral intake.  Started on IV fluids but not much  improvement. IV fluids stopped after finding of some congestion in the lungs Avoid nephrotoxins.  Nephrology following.  She needs nephrology referral as an outpatient  Pulmonary edema: Started with cough on 4/19.  Also discontinued mild crackles bilaterally.  Chest x-ray showed pulmonary congestion.  Started on Lasix 80 mg IV twice daily.  Continue current medications  Diabetes type 2/hypoglycemia: Found to be hypoglycemic at home.  Takes hemoglobin 70/30, metformin at home.  Continue sliding scale.  Monitor blood sugars.  A1c of 7.7  Acute metabolic encephalopathy: Most likely secondary to hypoglycemia.  Currently alert and oriented.  History of CVA: On Plavix  Hypertension: on amlodipine, Coreg.  ARB inhibitor on hold due to AKI.  Remains hypertensive.  Continue prn medications for severe hypertension.  Added hydralazine  Generalized weakness: PT consulted,no follow up recommended        DVT prophylaxis:enoxaparin (LOVENOX) injection 30 mg Start: 04/15/23 1000     Code Status: Full Code  Family Communication: Discussed with daughter at bedside on 4/19  Patient status: Inpatient  Patient is from : Home  Anticipated discharge to: Home  Estimated DC date: tomorrow   Consultants:Nephrology  Procedures: None  Antimicrobials:  Anti-infectives (From admission, onward)    Start     Dose/Rate Route Frequency Ordered Stop   04/16/23 1215  cephALEXin (KEFLEX) capsule 500 mg        500 mg Oral Every 12 hours 04/16/23 1124 04/21/23 0959   04/15/23 2200  cefTRIAXone (ROCEPHIN) 1 g in sodium chloride 0.9 % 100 mL IVPB  Status:  Discontinued        1 g 200 mL/hr over 30 Minutes Intravenous Every 24 hours 04/14/23 2325 04/15/23 0821   04/15/23 2200  cefTRIAXone (ROCEPHIN) 2  g in sodium chloride 0.9 % 100 mL IVPB  Status:  Discontinued        2 g 200 mL/hr over 30 Minutes Intravenous Every 24 hours 04/15/23 0821 04/16/23 1124   04/14/23 2115  cefTRIAXone (ROCEPHIN) 1 g in sodium  chloride 0.9 % 100 mL IVPB        1 g 200 mL/hr over 30 Minutes Intravenous  Once 04/14/23 2103 04/14/23 2222       Subjective: Patient seen and examined at bedside today.  Hemodynamically stable.  She denies any abdomen pain but is dealing with severe persistent cough with some phlegm.  Also patient revealed bibasilar crackles.  Objective: Vitals:   04/16/23 0513 04/16/23 1230 04/16/23 2054 04/17/23 0331  BP: (!) 165/74 (!) 168/65 (!) 147/60 (!) 156/57  Pulse: 76 71 76 72  Resp: Temp: 98.2 F (36.8 C) 98.6 F (37 C) 98.4 F (36.9 C) 98.4 F (36.9 C)  TempSrc: Oral Oral Oral Oral  SpO2: 92% 93% 93% 95%    Intake/Output Summary (Last 24 hours) at 04/17/2023 1158 Last data filed at 04/17/2023 0818 Gross per 24 hour  Intake 1251.67 ml  Output --  Net 1251.67 ml   There were no vitals filed for this visit.  Examination:  General exam: Overall comfortable, not in distress HEENT: PERRL Respiratory system: Mild crackles bilaterally Cardiovascular system: S1 & S2 heard, RRR.  Gastrointestinal system: Abdomen is nondistended, soft and nontender. Central nervous system: Alert and oriented Extremities: No edema, no clubbing ,no cyanosis Skin: No rashes, no ulcers,no icterus     Data Reviewed: I have personally reviewed following labs and imaging studies  CBC: Recent Labs  Lab 04/14/23 1715 04/15/23 0549 04/16/23 0505  WBC 12.0* 12.4* 9.9  NEUTROABS 10.0*  --   --   HGB 12.1 11.2* 10.7*  HCT 36.3 33.0* 32.4*  MCV 93.3 93.0 95.3  PLT 324 280 260   Basic Metabolic Panel: Recent Labs  Lab 04/12/23 1702 04/14/23 1715 04/15/23 0549 04/16/23 0505 04/17/23 0715  NA 140 136 136 134* 133*  K 4.6 3.6 3.6 3.8 3.8  CL 103 106 109 108 105  CO2 19* 20* 18* 19* 19*  GLUCOSE 246* 95 84 122* 169*  BUN 39* 36* 35* 36* 35*  CREATININE 2.56* 2.88* 2.74* 2.64* 2.64*  CALCIUM 9.3 8.4* 8.1* 8.2* 8.0*     Recent Results (from the past 240 hour(s))  Resp panel  by RT-PCR (RSV, Flu A&B, Covid) Anterior Nasal Swab     Status: None   Collection Time: 04/14/23  5:30 PM   Specimen: Anterior Nasal Swab  Result Value Ref Range Status   SARS Coronavirus 2 by RT PCR NEGATIVE NEGATIVE Final    Comment: (NOTE) SARS-CoV-2 target nucleic acids are NOT DETECTED.  The SARS-CoV-2 RNA is generally detectable in upper respiratory specimens during the acute phase of infection. The lowest concentration of SARS-CoV-2 viral copies this assay can detect is 138 copies/mL. A negative result does not preclude SARS-Cov-2 infection and should not be used as the sole basis for treatment or other patient management decisions. A negative result may occur with  improper specimen collection/handling, submission of specimen other than nasopharyngeal swab, presence of viral mutation(s) within the areas targeted by this assay, and inadequate number of viral copies(<138 copies/mL). A negative result must be combined with clinical observations, patient history, and epidemiological information. The expected result is Negative.  Fact Sheet for Patients:  BloggerCourse.com  Fact Sheet  for Healthcare Providers:  SeriousBroker.it  This test is no t yet approved or cleared by the [place] and  has been authorized for detection and/or diagnosis of SARS-CoV-2 by FDA under an Emergency Use Authorization (EUA). This EUA will remain  in effect (meaning this test can be used) for the duration of the COVID-19 declaration under Section 564(b)(1) of the Act, 21 U.S.C.section 360bbb-3(b)(1), unless the authorization is terminated  or revoked sooner.       Influenza A by PCR NEGATIVE NEGATIVE Final   Influenza B by PCR NEGATIVE NEGATIVE Final    Comment: (NOTE) The Xpert Xpress SARS-CoV-2/FLU/RSV plus assay is intended as an aid in the diagnosis of influenza from Nasopharyngeal swab specimens and should not be used as a sole  basis for treatment. Nasal washings and aspirates are unacceptable for Xpert Xpress SARS-CoV-2/FLU/RSV testing.  Fact Sheet for Patients: BloggerCourse.com  Fact Sheet for Healthcare Providers: SeriousBroker.it  This test is not yet approved or cleared by the [place] FDA and has been authorized for detection and/or diagnosis of SARS-CoV-2 by FDA under an Emergency Use Authorization (EUA). This EUA will remain in effect (meaning this test can be used) for the duration of the COVID-19 declaration under Section 564(b)(1) of the Act, 21 U.S.C. section 360bbb-3(b)(1), unless the authorization is terminated or revoked.     Resp Syncytial Virus by PCR NEGATIVE NEGATIVE Final    Comment: (NOTE) Fact Sheet for Patients: BloggerCourse.com  Fact Sheet for Healthcare Providers: SeriousBroker.it  This test is not yet approved or cleared by the [place] FDA and has been authorized for detection and/or diagnosis of SARS-CoV-2 by FDA under an Emergency Use Authorization (EUA). This EUA will remain in effect (meaning this test can be used) for the duration of the COVID-19 declaration under Section 564(b)(1) of the Act, 21 U.S.C. section 360bbb-3(b)(1), unless the authorization is terminated or revoked.  Performed at [facility], [address] [place], [place] [address]   Urine Culture     Status: Abnormal   Collection Time: 04/14/23  9:10 PM   Specimen: Urine, Clean Catch  Result Value Ref Range Status   Specimen Description   Final    URINE, CLEAN CATCH Performed at [facility], [address], [place], [place] [address]    Special Requests   Final    NONE Performed at [facility], [address] [place], [place] [address]    Culture >=100,000 COLONIES/mL ESCHERICHIA COLI (A)  Final   Report Status 04/16/2023  FINAL  Final   Organism ID, Bacteria ESCHERICHIA COLI (A)  Final      Susceptibility   Escherichia coli - MIC*    AMPICILLIN 4 SENSITIVE Sensitive     CEFAZOLIN <=4 SENSITIVE Sensitive     CEFEPIME <=0.12 SENSITIVE Sensitive     CEFTRIAXONE <=0.25 SENSITIVE Sensitive     CIPROFLOXACIN <=0.25 SENSITIVE Sensitive     GENTAMICIN <=1 SENSITIVE Sensitive     IMIPENEM <=0.25 SENSITIVE Sensitive     NITROFURANTOIN <=16 SENSITIVE Sensitive     TRIMETH/SULFA <=20 SENSITIVE Sensitive     AMPICILLIN/SULBACTAM <=2 SENSITIVE Sensitive     PIP/TAZO <=4 SENSITIVE Sensitive     * >=100,000 COLONIES/mL ESCHERICHIA COLI  Culture, blood (Routine X 2) w Reflex to ID Panel     Status: None (Preliminary result)   Collection Time: 04/15/23  8:34 AM   Specimen: BLOOD RIGHT HAND  Result Value Ref Range Status   Specimen  Description   Final    BLOOD RIGHT HAND Performed at [facility], [address], [place], [place] [address]    Special Requests   Final    BOTTLES DRAWN AEROBIC ONLY Blood Culture adequate volume Performed at [facility], [address] [place], [place] [address]    Culture   Final    NO GROWTH 2 DAYS Performed at [facility], [address] [place], [place] [address]    Report Status PENDING  Incomplete  Culture, blood (Routine X 2) w Reflex to ID Panel     Status: None (Preliminary result)   Collection Time: 04/15/23  8:34 AM   Specimen: BLOOD RIGHT ARM  Result Value Ref Range Status   Specimen Description   Final    BLOOD RIGHT ARM Performed at [facility], [address], [place], [place] [address]    Special Requests   Final    AEROBIC BOTTLE ONLY Blood Culture results may not be optimal due to an inadequate volume of blood received in culture bottles Performed at [facility], [address], [place], [place] [address]    Culture   Final    NO GROWTH 2 DAYS Performed at [facility], [address], [place], [place] [address]    Report Status PENDING  Incomplete     Radiology Studies: DG CHEST PORT 1 VIEW  Result Date: 04/17/2023 CLINICAL DATA:  Cough. EXAM: PORTABLE CHEST 1 VIEW COMPARISON:  Chest x-ray 04/16/2023 FINDINGS: The cardiac silhouette, mediastinal and hilar contours are within normal limits given the AP projection and portable technique. Stable tortuosity and calcification of the thoracic aorta. Peribronchial thickening and increased interstitial markings could suggest interstitial pulmonary edema or bronchitis. No pleural effusions or focal airspace consolidation. No pneumothorax. IMPRESSION: Peribronchial thickening and increased interstitial markings could suggest interstitial pulmonary edema or bronchitis. Electronically Signed   By: Rudie Meyer M.D.   On: 04/17/2023 10:32   DG CHEST PORT 1 VIEW  Result Date: 04/16/2023 CLINICAL DATA:  Cough. EXAM: PORTABLE CHEST 1 VIEW COMPARISON:  04/14/2023 FINDINGS: Lungs are somewhat hypoinflated demonstrate hazy prominence of the central pulmonary vessels likely representing mild vascular congestion. Slight increased density in the right infrahilar region could be due to superimposed airspace process. No definite effusion. No pneumothorax. Mild stable cardiomegaly. Remainder of the exam is unchanged. IMPRESSION: 1. Slight increased density in the right infrahilar region which could be due to superimposed airspace process such as atelectasis or infection. Consider PA and lateral chest radiograph for better evaluation. 2. Mild stable cardiomegaly with suggestion of mild vascular congestion. Electronically Signed   By: Elberta Fortis M.D.   On: 04/16/2023 10:41    Scheduled Meds:  amLODipine  10 mg Oral Daily   atorvastatin  80 mg Oral Daily   carvedilol  25 mg Oral BID WC   cephALEXin  500 mg Oral Q12H   clopidogrel  75 mg Oral Daily   enoxaparin (LOVENOX) injection  30 mg Subcutaneous Q24H   furosemide  80 mg  Intravenous Q12H   guaiFENesin-dextromethorphan  10 mL Oral Q6H   hydrALAZINE  25 mg Oral Q8H   insulin aspart  0-5 Units Subcutaneous QHS   insulin aspart  0-9 Units Subcutaneous TID WC   sodium bicarbonate  650 mg Oral TID   Continuous Infusions:     LOS: 3 days   Burnadette Pop, MD [facility] P4/20/2024, 11:58 AM

## 2023-04-17 NOTE — Progress Notes (Signed)
[facility] Progress Note  Subjective: pt didn't tolerate another trial of IVF's w/ SOB and coughing this am. IVF's dc'd and ordered IV lasix  Vitals:   04/16/23 1230 04/16/23 2054 04/17/23 0331 04/17/23 1306  BP: (!) 168/65 (!) 147/60 (!) 156/57 (!) 165/63  Pulse: 71 76 72 66  Resp: Temp: 98.6 F (37 C) 98.4 F (36.9 C) 98.4 F (36.9 C) 98.4 F (36.9 C)  TempSrc: Oral Oral Oral Oral  SpO2: 93% 93% 95% 95%    Exam: Gen alert, coughing a lot Chest bibasilar crackles RRR no MRG Abd soft ntnd no mass or ascites +bs Ext no LE edema Neuro is alert, Ox 3 , nf      Home meds include - ibuprofen, insulin  70-30, metformin, norvasc 10, lipitor, coreg 25 bid, plavix, amaryl, valsartan 320 qd, prns     Date                           Creat               eGFR   2016- 2017                0.97- 1.13   2018- 2019                1.12- 1.37        41- 52 ml/min   Nov 2022                   1.57- 1.72        32- 36 ml/min     4/15                           2.56                 20 ml/min           4/17                           2.88   4/18                           2.74   04/16/23                      2.64                 19 ml/min     UA 4/17 - prot >300, many bact, wbc 21-50, rbc 0-5    UNa 67    CT abd noncon 4.17 --> Urinary Tract: there is no focal abnormality of the unenhanced kidneys. Kidneys appear to be on the smaller side. Perinephric stranding bilaterally There are renovascular calcifications extending along both renal hila. No collecting system stones are seen and no hydronephrosis or ureteral stones. The bladder is contracted and not well seen but no adjacent inflammatory reaction or intravesical stones are noted.       BPs 160s- 70s, HR 70s RR 18 temp 98.6  RA 93%       UOP today = 300 cc           Assessment/ Plan: AKI on CKD 3b - b/l creat 1.5- 1.7 from nov 2022. Creat here was 2.56 on admit in setting of sepsis, prob UTI/ pyelonephritis w/  CT  showing bilat perirenal stranding. CT showed no obstruction and UA was c/w UTI. Pt rec'd 1 L bolus in ED and the IVF's which were stopped though due to concern for fluid in the lungs by CXR. Pt was on ARB and nsaid's at home. On initial exam no jvd or edema, rales at the bases. No SOB. Suspected AKI due to vol depletion+ home ARB/ nsaids, vs progression of CKD since 2022 to new baseline of CKD 4. IVF"s were given but pt became sig SOB overnight and these were stopped. Will give IV lasix  bid, titrate if needed. Lack of improvement w/ IVF"s suggests this is her new baseline, creat 2.3- 2.8, which is CKD IV, which goes along w/ her progressive renal decline over the last 6-8 yrs (see chart above). ATN is another possibility but there was no hypotension. For now IVF"s are dc'd and when her resp status is back to normal pt can be dc'd to f/u w/ [facility] in OP setting.  SOB / cough - early edema / vasc congestion by CXR. As above, starting IV lasix.  Sepsis/ UTI - possible pyelonephritis, UCx grew EColi. Getting appropriate IV abx.  DM2 - per pmd H/o CVA HTN - on coreg and norvasc. ARB on hold. Cont meds, diuresing as above.          Vinson Moselle MD [facility] 04/17/2023, 3:48 PM  Recent Labs  Lab 04/12/23 1702 04/12/23 1702 04/14/23 1715 04/15/23 0549 04/16/23 0505 04/17/23 0715  HGB  --    < > 12.1 11.2* 10.7*  --   ALBUMIN 3.9  --  3.4*  --   --   --   CALCIUM 9.3  --  8.4* 8.1* 8.2* 8.0*  CREATININE 2.56*  --  2.88* 2.74* 2.64* 2.64*  K 4.6  --  3.6 3.6 3.8 3.8   < > = values in this interval not displayed.   No results for input(s): "IRON", "TIBC", "FERRITIN" in the last 168 hours. Inpatient medications:  amLODipine  10 mg Oral Daily   atorvastatin  80 mg Oral Daily   carvedilol  25 mg Oral BID WC   cephALEXin  500 mg Oral Q12H   clopidogrel  75 mg Oral Daily   enoxaparin (LOVENOX) injection  30 mg Subcutaneous Q24H   furosemide  80 mg Intravenous Q12H   guaiFENesin-dextromethorphan  10  mL Oral Q6H   hydrALAZINE  25 mg Oral Q8H   insulin aspart  0-5 Units Subcutaneous QHS   insulin aspart  0-9 Units Subcutaneous TID WC   sodium bicarbonate  650 mg Oral TID    acetaminophen, labetalol, melatonin, ondansetron (ZOFRAN) IV, polyvinyl alcohol

## 2023-04-18 LAB — BASIC METABOLIC PANEL
Anion gap: 10 (ref 5–15)
BUN: 34 mg/dL — ABNORMAL HIGH (ref 8–23)
CO2: 22 mmol/L (ref 22–32)
Calcium: 8.4 mg/dL — ABNORMAL LOW (ref 8.9–10.3)
Chloride: 102 mmol/L (ref 98–111)
Creatinine, Ser: 2.83 mg/dL — ABNORMAL HIGH (ref 0.44–1.00)
GFR, Estimated: 18 mL/min — ABNORMAL LOW (ref >60)
Glucose, Bld: 169 mg/dL — ABNORMAL HIGH (ref 70–99)
Potassium: 3.5 mmol/L (ref 3.5–5.1)
Sodium: 134 mmol/L — ABNORMAL LOW (ref 135–145)

## 2023-04-18 LAB — GLUCOSE, CAPILLARY
Glucose-Capillary: 165 mg/dL — ABNORMAL HIGH (ref 70–99)
Glucose-Capillary: 303 mg/dL — ABNORMAL HIGH (ref 70–99)

## 2023-04-18 LAB — CULTURE, BLOOD (ROUTINE X 2)

## 2023-04-18 MED ORDER — FUROSEMIDE 40 MG PO TABS: 40.0000 mg | ORAL_TABLET | Freq: Every day | ORAL | 0 refills | Status: DC

## 2023-04-18 MED ORDER — SODIUM BICARBONATE 650 MG PO TABS: 650.0000 mg | ORAL_TABLET | Freq: Three times a day (TID) | ORAL | 0 refills | Status: DC

## 2023-04-18 MED ORDER — HYDRALAZINE HCL 50 MG PO TABS: 50.0000 mg | ORAL_TABLET | Freq: Three times a day (TID) | ORAL | 1 refills | Status: DC

## 2023-04-18 MED ORDER — HYDRALAZINE HCL 50 MG PO TABS
50.0000 mg | ORAL_TABLET | Freq: Three times a day (TID) | ORAL | Status: DC
  Administered 2023-04-18: 50 mg via ORAL
  Filled 2023-04-18: qty 1

## 2023-04-18 MED ORDER — CEPHALEXIN 500 MG PO CAPS: 500.0000 mg | ORAL_CAPSULE | Freq: Two times a day (BID) | ORAL | 0 refills | Status: AC

## 2023-04-18 NOTE — Progress Notes (Signed)
[facility] Progress Note  Subjective: pt voided well (not recorded) w/ IV lasix last night and SOB/ cough are much better today.   Vitals:   04/17/23 0331 04/17/23 1306 04/17/23 1918 04/18/23 0558  BP: (!) 156/57 (!) 165/63 (!) 149/65 (!) 162/54  Pulse: 72 66 65 68  Resp: Temp: 98.4 F (36.9 C) 98.4 F (36.9 C) 98.4 F (36.9 C) 97.9 F (36.6 C)  TempSrc: Oral Oral Oral Oral  SpO2: 95% 95% 96% 95%    Exam: Gen alert, looks much better, calm, occ cough, no ^wob Chest mostly clear RRR no MRG Abd soft ntnd no mass or ascites +bs Ext no LE edema Neuro is alert, Ox 3 , nf      Home meds include - ibuprofen, insulin  70-30, metformin, norvasc 10, lipitor, coreg 25 bid, plavix, amaryl, valsartan 320 qd, prns     Date                           Creat               eGFR   2016- 2017                0.97- 1.13   2018- 2019                1.12- 1.37        41- 52 ml/min   Nov 2022                   1.57- 1.72        32- 36 ml/min     4/15                           2.56                 20 ml/min           4/17                           2.88   4/18                           2.74   04/16/23                      2.64                 19 ml/min     UA 4/17 - prot >300, many bact, wbc 21-50, rbc 0-5    UNa 67    CT abd noncon 4.17 --> Urinary Tract: there is no focal abnormality of the unenhanced kidneys. Kidneys appear to be on the smaller side. Perinephric stranding bilaterally There are renovascular calcifications extending along both renal hila. No collecting system stones are seen and no hydronephrosis or ureteral stones. The bladder is contracted and not well seen but no adjacent inflammatory reaction or intravesical stones are noted.      Assessment/ Plan: AKI on CKD 3b - b/l creat 1.5- 1.7 from nov 2022. Creat here was 2.56 on admit in setting of sepsis due to UTI/ pyelonephritis. Pt was on ARB and nsaid's at home. CT showed no obstruction and UA was c/w  UTI. Pt rec'd 1 L bolus in ED and then got IVF's. Fluids  were stopped though due to concern for fluid in the lungs on CXR. Creat did not improve from mid 2's so we were asked to consult. On initial exam no jvd or edema, rales at the bases. No SOB. Possibly AKI due to vol depletion+ home ARB/ nsaids, vs progression of CKD since 2022 to new baseline of CKD 4. IVF"s were tried again but had sig SOB overnight so IVF's were dc'd (again) and pt was given IV lasix  bid.  Lack of improvement w/ IVF"s x2 suggests that creat 2.3-2.8, CKD 4, is her new baseline. Pt diuresed and SOB has resolved. OK for dc. Okay to give short-term po diuretics at dc but would not do more than 7-10 days. Will we contact her/ her family this week to set up OP [facility] f/u. Have d/w pmd. Will sign off.  SOB / cough - early edema/ vol overload, resolved today.  Sepsis/ UTI - possible pyelonephritis, UCx grew EColi. Abx per pmd.   DM2 - per pmd H/o CVA HTN - on coreg and norvasc. ARB on hold. Cont meds, diuresing as above.          Vinson Moselle MD [facility] 04/18/2023, 12:29 PM  Recent Labs  Lab 04/12/23 1702 04/12/23 1702 04/14/23 1715 04/15/23 0549 04/16/23 0505 04/17/23 0715  HGB  --    < > 12.1 11.2* 10.7*  --   ALBUMIN 3.9  --  3.4*  --   --   --   CALCIUM 9.3  --  8.4* 8.1* 8.2* 8.0*  CREATININE 2.56*  --  2.88* 2.74* 2.64* 2.64*  K 4.6  --  3.6 3.6 3.8 3.8   < > = values in this interval not displayed.    No results for input(s): "IRON", "TIBC", "FERRITIN" in the last 168 hours. Inpatient medications:  amLODipine  10 mg Oral Daily   atorvastatin  80 mg Oral Daily   carvedilol  25 mg Oral BID WC   cephALEXin  500 mg Oral Q12H   clopidogrel  75 mg Oral Daily   enoxaparin (LOVENOX) injection  30 mg Subcutaneous Q24H   guaiFENesin-dextromethorphan  10 mL Oral Q6H   hydrALAZINE  50 mg Oral Q8H   insulin aspart  0-5 Units Subcutaneous QHS   insulin aspart  0-9 Units Subcutaneous TID WC   sodium bicarbonate  650 mg  Oral TID    acetaminophen, labetalol, melatonin, ondansetron (ZOFRAN) IV, polyvinyl alcohol

## 2023-04-18 NOTE — Discharge Summary (Signed)
Physician Discharge Summary  Wilena Tyndall ZOX:[id number] DOB: Apr 13, 1951 DOA: 04/14/2023  PCP: Hoy Register, MD  Admit date: 04/14/2023 Discharge date: 04/18/2023  Admitted From: Home Disposition:  Home  Discharge Condition:Stable CODE STATUS:FULL Diet recommendation:  Carb Modified   Brief/Interim Summary: Patient is a 72 year old female with history of peripheral artery disease, hypertension, insulin-dependent diabetes type 2, CKD stage IIIb, CVA, anxiety, depression who presented with altered mental status, feeling unwell, chills..  Patient speaks Spanish.  Her daughter found her difficulty to wake up.  EMS saw her and found to be hypoglycemic.  On presentation, she was febrile, hypertensive.  Lab work showed leukocytosis.  Creatinine was 2.88, baseline creatinine around 1.5.  Ammonia level normal.  UA showed moderate leukocytes, negative nitrate, many bacteria.  CT abdomen/pelvis showed symmetrical perinephric stranding, no hydronephrosis or ureteral stones.  Patient was admitted for the management of UTI, AKI and possible pyonephritis. Clinically improved but she developed cough on 4/19.  Chest x-ray showed stable of mild pulmonary edema.  Started on iv Lasix with improvement today.  Lungs are clear to auscultation.  Medically stable for discharge to home today.  She will be called by nephrology for follow-up appointment.  Following problems were addressed during the hospitalization:  Sepsis secondary to UTI/ +/-pyonephritis,:Presented with fever, leukocytosis.  UA suspicious for UTI.CT abdomen/pelvis showed symmetrical  perinephric stranding, no hydronephrosis or ureteral stones.  She does not have costovertebral tenderness but complained of lower abdominal discomfort. started on ceftriaxone.  Urine cultures for pansensitive E. coli.  Antibiotics changed to keflex   AKI on CKD stage IIIb: Baseline creatinine 1.5.  Presented with urine in the range of 2.8. AKI thought to be most  likely from decreased oral intake.  Started on IV fluids but not much improvement. IV fluids stopped after finding of some congestion in the lungs  Nephrology was following.  She will be called by nephrology for follow-up appointment  Pulmonary edema: Started with cough on 4/19.  Chest x-ray showed pulmonary congestion.  Started on Lasix 80 mg IV twice daily with improvement.  Lungs are clear on auscultation today  Diabetes type 2/hypoglycemia: Found to be hypoglycemic at home.  Takes hemoglobin 70/30, glimepiride, metformin at home.   A1c of 7.7.  Metformin discontinued due to CKD progression   Acute metabolic encephalopathy: Most likely secondary to hypoglycemia.  Currently alert and oriented.   History of CVA: On Plavix   Hypertension: on amlodipine, Coreg.  ARB inhibitor on hold due to AKI.  Added hydralazine   Generalized weakness: PT consulted,no follow up recommended   Discharge Diagnoses:  Principal Problem:   Sepsis secondary to UTI Active Problems:   Essential hypertension   Type 2 diabetes mellitus with complication, with long-term current use of insulin   Pyelonephritis   AKI (acute kidney injury)   History of CVA (cerebrovascular accident)   Acute metabolic encephalopathy   Hypoglycemia    Discharge Instructions  Discharge Instructions     Diet Carb Modified   Complete by: As directed    Discharge instructions   Complete by: As directed    1)Please take prescribed medications as instructed 2)Follow up with your PCP in a week and do a BMP test during the follow-up to check your kidney function 3)You will be called by nephrology for follow-up appointment 4)Monitor your blood sugars at home.  We have stopped metformin due to your diminished kidney function.  You need to follow-up closely with your  PCP for the management of your diabetes.  Increase activity slowly   Complete by: As directed       Allergies as of 04/18/2023       Reactions   Perflutren Other  (See Comments)   Pt with back pain as soon as administered.- patient refuted this in 2024        Medication List     STOP taking these medications    losartan 100 MG tablet Commonly known as: COZAAR   metFORMIN 1000 MG tablet Commonly known as: GLUCOPHAGE   valsartan 320 MG tablet Commonly known as: DIOVAN       TAKE these medications    amLODipine 10 MG tablet Commonly known as: NORVASC Tome 1 tableta (10 mg en total) por va oral diariamente. (Take 1 tablet (10 mg total) by mouth daily.)   atorvastatin 80 MG tablet Commonly known as: LIPITOR Tome 1 tableta (80 mg en total) por va oral diariamente. (Take 1 tablet (80 mg total) by mouth daily.)   carvedilol 25 MG tablet Commonly known as: COREG Take 1 tablet (25 mg total) by mouth 2 (two) times daily with a meal.   cephALEXin 500 MG capsule Commonly known as: KEFLEX Take 1 capsule (500 mg total) by mouth every 12 (twelve) hours for 3 days.   clopidogrel 75 MG tablet Commonly known as: PLAVIX Tome 1 tableta (75 mg en total) por va oral diariamente. (Take 1 tablet (75 mg total) by mouth daily.)   furosemide 40 MG tablet Commonly known as: Lasix Take 1 tablet (40 mg total) by mouth daily for 7 days.   glimepiride 4 MG tablet Commonly known as: AMARYL Tome 1 tableta (4 mg en total) por va oral diariamente con el desayuno. (Take 1 tablet (4 mg total) by mouth daily with breakfast.)   glucose blood test strip Use 3 times daily before meals   HumuLIN 70/30 KwikPen (70-30) 100 UNIT/ML KwikPen Generic drug: insulin isophane & regular human KwikPen Inject 30 Units into the skin in the morning AND 25 Units every evening. What changed: See the new instructions.   hydrALAZINE 50 MG tablet Commonly known as: APRESOLINE Take 1 tablet (50 mg total) by mouth every 8 (eight) hours.   ibuprofen 200 MG tablet Commonly known as: ADVIL Take 200 mg by mouth every 6 (six) hours as needed for mild pain or headache.    Insulin Syringe-Needle U-100 31G X 15/64" 0.5 ML Misc Commonly known as: BD Insulin Syringe Ultrafine Use subcutaneously twice daily   sodium bicarbonate 650 MG tablet Take 1 tablet (650 mg total) by mouth 3 (three) times daily.   TRUEplus Lancets 28G Misc 1 each by Does not apply route 3 (three) times daily before meals.        Follow-up Information     Hoy Register, MD. Schedule an appointment as soon as possible for a visit in 1 week(s).   Specialty: Family Medicine Contact information: [address] [place] [address] [place] [address] [phone]                Allergies  Allergen Reactions   Perflutren Other (See Comments)    Pt with back pain as soon as administered.- patient refuted this in 2024    Consultations: Nephrology   Procedures/Studies: DG CHEST PORT 1 VIEW  Result Date: 04/17/2023 CLINICAL DATA:  Cough. EXAM: PORTABLE CHEST 1 VIEW COMPARISON:  Chest x-ray 04/16/2023 FINDINGS: The cardiac silhouette, mediastinal and hilar contours are within normal limits given the AP projection and portable technique. Stable tortuosity  and calcification of the thoracic aorta. Peribronchial thickening and increased interstitial markings could suggest interstitial pulmonary edema or bronchitis. No pleural effusions or focal airspace consolidation. No pneumothorax. IMPRESSION: Peribronchial thickening and increased interstitial markings could suggest interstitial pulmonary edema or bronchitis. Electronically Signed   By: Rudie Meyer M.D.   On: 04/17/2023 10:32   DG CHEST PORT 1 VIEW  Result Date: 04/16/2023 CLINICAL DATA:  Cough. EXAM: PORTABLE CHEST 1 VIEW COMPARISON:  04/14/2023 FINDINGS: Lungs are somewhat hypoinflated demonstrate hazy prominence of the central pulmonary vessels likely representing mild vascular congestion. Slight increased density in the right infrahilar region could be due to superimposed airspace process. No definite effusion. No  pneumothorax. Mild stable cardiomegaly. Remainder of the exam is unchanged. IMPRESSION: 1. Slight increased density in the right infrahilar region which could be due to superimposed airspace process such as atelectasis or infection. Consider PA and lateral chest radiograph for better evaluation. 2. Mild stable cardiomegaly with suggestion of mild vascular congestion. Electronically Signed   By: Elberta Fortis M.D.   On: 04/16/2023 10:41   CT CHEST ABDOMEN PELVIS WO CONTRAST  Result Date: 04/14/2023 CLINICAL DATA:  Abnormal chest x-ray, and left lower quadrant abdominal pain. EXAM: CT CHEST, ABDOMEN AND PELVIS WITHOUT CONTRAST TECHNIQUE: Multidetector CT imaging of the chest, abdomen and pelvis was performed following the standard protocol without IV contrast. RADIATION DOSE REDUCTION: This exam was performed according to the departmental dose-optimization program which includes automated exposure control, adjustment of the mA and/or kV according to patient size and/or use of iterative reconstruction technique. COMPARISON:  Portable chest today, portable chest 01/10/2015. No prior body CT for comparison. FINDINGS: CT CHEST FINDINGS Cardiovascular: The heart is upper limit of normal in size. There is no pericardial effusion. There are calcifications in the left main, lad and circumflex coronary arteries. The pulmonary arteries and veins are normal caliber. There are moderate patchy calcifications in the arch and descending aorta and proximal left subclavian artery, and a two-vessel aortic arch with normal variant brachiobicarotid trunk. No aortic aneurysm is seen, with mild tortuosity in the descending segment. There is suspected at least 60-70% calcific stenosis in the proximal 1 cm of the left subclavian artery. Mediastinum/Nodes: Thyroid gland, axillary spaces are unremarkable. There are slightly prominent right of midline precarinal, and subcarinal lymph nodes up to 1 cm in short axis, similar sized single  right mid hilar lymph node, but there is no further adenopathy. There is a small hiatal hernia with unremarkable thoracic esophagus. No abnormality is seen within the thoracic trachea and main bronchi apart from mucoid septations. There are also tracheobronchial chondral calcifications. Lungs/Pleura: There is diffuse bronchial thickening. No bronchial plugging is seen. There is subpleural reticulation in the mid to lower lung fields, without honeycombing, findings consistent with mild interstitial lung disease. There are mild atelectatic changes of the posterior lungs, without evidence of focal pneumonia. No lung nodules are seen. No pleural effusion, thickening or pneumothorax. Musculoskeletal: There are degenerative changes of the spine and shoulders. No acute or significant osseous findings or chest wall mass. CT ABDOMEN PELVIS FINDINGS Hepatobiliary: There are vascular calcifications at the hepatic hilum and a few tiny calcified granulomas in the liver. The liver is otherwise unremarkable. There is mild dilatation of the gallbladder and a few small stones in the proximal lumen, but no appreciable wall thickening, pericholecystic edema or biliary dilatation. Pancreas: There is mild glandular atrophy. No focal mass is seen without contrast. There are either scattered vascular calcifications in the parenchyma  or calcifications of chronic pancreatitis, but no acute inflammation is evident. Spleen: There are calcified granulomas. Patchy heavy calcification splenic artery. No splenomegaly or focal mass is seen without contrast. Adrenals/Urinary Tract: There is no adrenal mass. There is no focal abnormality of the unenhanced kidneys. Perinephric stranding symmetric and likely on a senescent basis. There are renovascular calcifications extending along both renal hila. No collecting system stones are seen and no hydronephrosis or ureteral stones. The bladder is contracted and not well seen but no adjacent inflammatory  reaction or intravesical stones are noted. Stomach/Bowel: No dilatation or wall thickening including the appendix. There is sigmoid diverticulosis but no findings of acute diverticulitis. Mild fecal stasis. Vascular/Lymphatic: There is heavy aortoiliac and visceral branch vessel atherosclerosis. No AAA. No abdominopelvic adenopathy. Reproductive: Status post hysterectomy. No adnexal masses. Other: There is a small umbilical fat hernia. There is no incarcerated hernia. There is no free fluid, free hemorrhage or free air. Musculoskeletal: Mild osteopenia and degenerative change lumbar spine, spurring of the SI joints, and bridging osteophytes over the left inferior SI joint. No acute or other significant osseous findings. IMPRESSION: 1. Bronchitis without evidence of pneumonia. 2. Subpleural reticulation in the mid to lower lung fields consistent with mild interstitial lung disease. 3. Aortic and coronary artery atherosclerosis. Visceral branch vessel atherosclerosis in the abdomen. 4. 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 5. Small hiatal hernia. 6. Cholelithiasis with mild gallbladder dilatation but no wall thickening or pericholecystic edema. 7. Scattered tiny calcifications in the pancreas, could be vascular or due to chronic calcific pancreatitis. No acute peripancreatic edema. 8. Constipation and diverticulosis. No bowel obstruction or inflammation. 9. Umbilical fat hernia. 10. Osteopenia and degenerative change. Electronically Signed   By: Almira Bar M.D.   On: 04/14/2023 20:28   CT Head Wo Contrast  Result Date: 04/14/2023 CLINICAL DATA:  Delirium. EXAM: CT HEAD WITHOUT CONTRAST CT CERVICAL SPINE WITHOUT CONTRAST TECHNIQUE: Multidetector CT imaging of the head and cervical spine was performed following the standard protocol without intravenous contrast. Multiplanar CT image reconstructions of the cervical spine were also generated. RADIATION DOSE REDUCTION: This exam  was performed according to the departmental dose-optimization program which includes automated exposure control, adjustment of the mA and/or kV according to patient size and/or use of iterative reconstruction technique. COMPARISON:  Head CT dated 11/08/2021. FINDINGS: CT HEAD FINDINGS Brain: The ventricles and sulci appropriate size for patient's age. Small area of infarct in the left cerebellar hemisphere, small left cerebellar old infarct. Subcentimeter right basal ganglia hypodense focus, likely a small old lacunar infarct. There is no acute intracranial hemorrhage. No mass effect or midline shift. No extra-axial fluid collection. Vascular: No hyperdense vessel or unexpected calcification. Skull: Normal. Negative for fracture or focal lesion. Sinuses/Orbits: Diffuse mucoperiosteal thickening of paranasal sinuses with partial opacification of maxillary sinuses. The mastoid air cells are clear. Other: None CT CERVICAL SPINE FINDINGS Alignment: No acute subluxation. Skull base and vertebrae: No acute fracture. Soft tissues and spinal canal: No prevertebral fluid or swelling. No visible canal hematoma. Disc levels:  No acute findings.  Degenerative changes. Upper chest: Negative. Other: Bilateral carotid bulb calcified plaques. IMPRESSION: 1. No acute intracranial pathology. 2. 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 3. No acute/traumatic cervical spine pathology. 4. Paranasal sinus disease. Electronically Signed   By: Elgie Collard M.D.   On: 04/14/2023 20:04   CT CERVICAL SPINE WO CONTRAST  Result Date: 04/14/2023 CLINICAL DATA:  Delirium. EXAM: CT HEAD WITHOUT CONTRAST  CT CERVICAL SPINE WITHOUT CONTRAST TECHNIQUE: Multidetector CT imaging of the head and cervical spine was performed following the standard protocol without intravenous contrast. Multiplanar CT image reconstructions of the cervical spine were also generated. RADIATION DOSE REDUCTION: This exam was performed according to the  departmental dose-optimization program which includes automated exposure control, adjustment of the mA and/or kV according to patient size and/or use of iterative reconstruction technique. COMPARISON:  Head CT dated 11/08/2021. FINDINGS: CT HEAD FINDINGS Brain: The ventricles and sulci appropriate size for patient's age. Small area of infarct in the left cerebellar hemisphere, small left cerebellar old infarct. Subcentimeter right basal ganglia hypodense focus, likely a small old lacunar infarct. There is no acute intracranial hemorrhage. No mass effect or midline shift. No extra-axial fluid collection. Vascular: No hyperdense vessel or unexpected calcification. Skull: Normal. Negative for fracture or focal lesion. Sinuses/Orbits: Diffuse mucoperiosteal thickening of paranasal sinuses with partial opacification of maxillary sinuses. The mastoid air cells are clear. Other: None CT CERVICAL SPINE FINDINGS Alignment: No acute subluxation. Skull base and vertebrae: No acute fracture. Soft tissues and spinal canal: No prevertebral fluid or swelling. No visible canal hematoma. Disc levels:  No acute findings.  Degenerative changes. Upper chest: Negative. Other: Bilateral carotid bulb calcified plaques. IMPRESSION: 1. No acute intracranial pathology. 2. 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 3. No acute/traumatic cervical spine pathology. 4. Paranasal sinus disease. Electronically Signed   By: Elgie Collard M.D.   On: 04/14/2023 20:04   DG Chest Portable 1 View  Result Date: 04/14/2023 CLINICAL DATA:  Chest pain. EXAM: PORTABLE CHEST 1 VIEW COMPARISON:  Chest x-ray 01/10/2015. FINDINGS: Low lung volumes. Streaky left basilar opacities. No visible pleural effusions or pneumothorax. No acute osseous abnormality. Cardiac silhouette is accentuated by technique and enlarged. IMPRESSION: Low lung volumes with streaky left basilar opacities, most likely atelectasis. Dedicated PA and lateral radiographs could  better assess if clinically warranted. Electronically Signed   By: Feliberto Harts M.D.   On: 04/14/2023 18:23      Subjective: Patient seen and examined the bedside today.  Hemodynamically stable for discharge.  Discharge plan discussed with daughter at bedside  Discharge Exam: Vitals:   04/18/23 0558 04/18/23 1244  BP: (!) 162/54 (!) 138/53  Pulse: 68 70  Resp: 14 18  Temp: 97.9 F (36.6 C) 97.7 F (36.5 C)  SpO2: 95% 95%   Vitals:   04/17/23 1306 04/17/23 1918 04/18/23 0558 04/18/23 1244  BP: (!) 165/63 (!) 149/65 (!) 162/54 (!) 138/53  Pulse: 66 65 68 70  Resp: 18 20 14 18   Temp: 98.4 F (36.9 C) 98.4 F (36.9 C) 97.9 F (36.6 C) 97.7 F (36.5 C)  TempSrc: Oral Oral Oral Oral  SpO2: 95% 96% 95% 95%    General: Pt is alert, awake, not in acute distress Cardiovascular: RRR, S1/S2 +, no rubs, no gallops Respiratory: CTA bilaterally, no wheezing, no rhonchi Abdominal: Soft, NT, ND, bowel sounds + Extremities: no edema, no cyanosis    The results of significant diagnostics from this hospitalization (including imaging, microbiology, ancillary and laboratory) are listed below for reference.     Microbiology: Recent Results (from the past 240 hour(s))  Resp panel by RT-PCR (RSV, Flu A&B, Covid) Anterior Nasal Swab     Status: None   Collection Time: 04/14/23  5:30 PM   Specimen: Anterior Nasal Swab  Result Value Ref Range Status   SARS Coronavirus 2 by RT PCR NEGATIVE NEGATIVE Final    Comment: (NOTE)  SARS-CoV-2 target nucleic acids are NOT DETECTED.  The SARS-CoV-2 RNA is generally detectable in upper respiratory specimens during the acute phase of infection. The lowest concentration of SARS-CoV-2 viral copies this assay can detect is 138 copies/mL. A negative result does not preclude SARS-Cov-2 infection and should not be used as the sole basis for treatment or other patient management decisions. A negative result may occur with  improper specimen  collection/handling, submission of specimen other than nasopharyngeal swab, presence of viral mutation(s) within the areas targeted by this assay, and inadequate number of viral copies(<138 copies/mL). A negative result must be combined with clinical observations, patient history, and epidemiological information. The expected result is Negative.  Fact Sheet for Patients:  BloggerCourse.com  Fact Sheet for Healthcare Providers:  SeriousBroker.it  This test is no t yet approved or cleared by the [place] FDA and  has been authorized for detection and/or diagnosis of SARS-CoV-2 by FDA under an Emergency Use Authorization (EUA). This EUA will remain  in effect (meaning this test can be used) for the duration of the COVID-19 declaration under Section 564(b)(1) of the Act, 21 U.S.C.section 360bbb-3(b)(1), unless the authorization is terminated  or revoked sooner.       Influenza A by PCR NEGATIVE NEGATIVE Final   Influenza B by PCR NEGATIVE NEGATIVE Final    Comment: (NOTE) The Xpert Xpress SARS-CoV-2/FLU/RSV plus assay is intended as an aid in the diagnosis of influenza from Nasopharyngeal swab specimens and should not be used as a sole basis for treatment. Nasal washings and aspirates are unacceptable for Xpert Xpress SARS-CoV-2/FLU/RSV testing.  Fact Sheet for Patients: BloggerCourse.com  Fact Sheet for Healthcare Providers: SeriousBroker.it  This test is not yet approved or cleared by the [place] FDA and has been authorized for detection and/or diagnosis of SARS-CoV-2 by FDA under an Emergency Use Authorization (EUA). This EUA will remain in effect (meaning this test can be used) for the duration of the COVID-19 declaration under Section 564(b)(1) of the Act, 21 U.S.C. section 360bbb-3(b)(1), unless the authorization is terminated or revoked.     Resp Syncytial  Virus by PCR NEGATIVE NEGATIVE Final    Comment: (NOTE) Fact Sheet for Patients: BloggerCourse.com  Fact Sheet for Healthcare Providers: SeriousBroker.it  This test is not yet approved or cleared by the [place] FDA and has been authorized for detection and/or diagnosis of SARS-CoV-2 by FDA under an Emergency Use Authorization (EUA). This EUA will remain in effect (meaning this test can be used) for the duration of the COVID-19 declaration under Section 564(b)(1) of the Act, 21 U.S.C. section 360bbb-3(b)(1), unless the authorization is terminated or revoked.  Performed at [facility], [address], [place], [place] [address]   Urine Culture     Status: Abnormal   Collection Time: 04/14/23  9:10 PM   Specimen: Urine, Clean Catch  Result Value Ref Range Status   Specimen Description   Final    URINE, CLEAN CATCH Performed at [facility], [address], [place], [place] [address]    Special Requests   Final    NONE Performed at [facility], [address] [place], [place] [address]    Culture >=100,000 COLONIES/mL ESCHERICHIA COLI (A)  Final   Report Status 04/16/2023 FINAL  Final   Organism ID, Bacteria ESCHERICHIA COLI (A)  Final      Susceptibility   Escherichia coli - MIC*    AMPICILLIN 4 SENSITIVE Sensitive     CEFAZOLIN <=  4 SENSITIVE Sensitive     CEFEPIME <=0.12 SENSITIVE Sensitive     CEFTRIAXONE <=0.25 SENSITIVE Sensitive     CIPROFLOXACIN <=0.25 SENSITIVE Sensitive     GENTAMICIN <=1 SENSITIVE Sensitive     IMIPENEM <=0.25 SENSITIVE Sensitive     NITROFURANTOIN <=16 SENSITIVE Sensitive     TRIMETH/SULFA <=20 SENSITIVE Sensitive     AMPICILLIN/SULBACTAM <=2 SENSITIVE Sensitive     PIP/TAZO <=4 SENSITIVE Sensitive     * >=100,000 COLONIES/mL ESCHERICHIA COLI  Culture, blood (Routine X 2) w Reflex to ID Panel     Status: None (Preliminary result)    Collection Time: 04/15/23  8:34 AM   Specimen: BLOOD RIGHT HAND  Result Value Ref Range Status   Specimen Description   Final    BLOOD RIGHT HAND Performed at [facility], [address] [place], [place] [address]    Special Requests   Final    BOTTLES DRAWN AEROBIC ONLY Blood Culture adequate volume Performed at [facility], [address], [place], [place] [address]    Culture   Final    NO GROWTH 3 DAYS Performed at [facility], [address], [place], [place] [address]    Report Status PENDING  Incomplete  Culture, blood (Routine X 2) w Reflex to ID Panel     Status: None (Preliminary result)   Collection Time: 04/15/23  8:34 AM   Specimen: BLOOD RIGHT ARM  Result Value Ref Range Status   Specimen Description   Final    BLOOD RIGHT ARM Performed at [facility], [address] [place], [place] [address]    Special Requests   Final    AEROBIC BOTTLE ONLY Blood Culture results may not be optimal due to an inadequate volume of blood received in culture bottles Performed at [facility], [address] [place], [place] [address]    Culture   Final    NO GROWTH 3 DAYS Performed at [facility], [address] [place], [place] [address]    Report Status PENDING  Incomplete     Labs: BNP (last 3 results) Recent Labs    04/14/23 1730  BNP 319.2*   Basic Metabolic Panel: Recent Labs  Lab 04/14/23 1715 04/15/23 0549 04/16/23 0505 04/17/23 0715 04/18/23 0808  NA 136 136 134* 133* 134*  K 3.6 3.6 3.8 3.8 3.5  CL 106 109 108 105 102  CO2 20* 18* 19* 19* 22  GLUCOSE 95 84 122* 169* 169*  BUN 36* 35* 36* 35* 34*  CREATININE 2.88* 2.74* 2.64* 2.64* 2.83*  CALCIUM 8.4* 8.1* 8.2* 8.0* 8.4*   Liver Function Tests: Recent Labs  Lab 04/12/23 1702 04/14/23 1715  AST 16 18  ALT 14 11  ALKPHOS 108 62  BILITOT <0.2 0.7  PROT 6.8 6.9  ALBUMIN 3.9 3.4*   Recent Labs  Lab  04/14/23 1715  LIPASE 34   Recent Labs  Lab 04/14/23 1730  AMMONIA 15   CBC: Recent Labs  Lab 04/14/23 1715 04/15/23 0549 04/16/23 0505  WBC 12.0* 12.4* 9.9  NEUTROABS 10.0*  --   --   HGB 12.1 11.2* 10.7*  HCT 36.3 33.0* 32.4*  MCV 93.3 93.0 95.3  PLT 324 280 260   Cardiac Enzymes: Recent Labs  Lab 04/15/23 0549  CKTOTAL 233   BNP: Invalid input(s): "POCBNP" CBG: Recent Labs  Lab 04/17/23 1154 04/17/23 1700 04/17/23 2112 04/18/23 0724 04/18/23 1142  GLUCAP 209*  220* 168* 165* 303*   D-Dimer No results for input(s): "DDIMER" in the last 72 hours. Hgb A1c No results for input(s): "HGBA1C" in the last 72 hours. Lipid Profile No results for input(s): "CHOL", "HDL", "LDLCALC", "TRIG", "CHOLHDL", "LDLDIRECT" in the last 72 hours. Thyroid function studies No results for input(s): "TSH", "T4TOTAL", "T3FREE", "THYROIDAB" in the last 72 hours.  Invalid input(s): "FREET3" Anemia work up No results for input(s): "VITAMINB12", "FOLATE", "FERRITIN", "TIBC", "IRON", "RETICCTPCT" in the last 72 hours. Urinalysis    Component Value Date/Time   COLORURINE YELLOW 04/14/2023 1718   APPEARANCEUR HAZY (A) 04/14/2023 1718   LABSPEC 1.010 04/14/2023 1718   PHURINE 5.0 04/14/2023 1718   GLUCOSEU NEGATIVE 04/14/2023 1718   HGBUR MODERATE (A) 04/14/2023 1718   BILIRUBINUR NEGATIVE 04/14/2023 1718   BILIRUBINUR negative 08/18/2021 1207   BILIRUBINUR neg 06/28/2017 1508   KETONESUR NEGATIVE 04/14/2023 1718   PROTEINUR >=300 (A) 04/14/2023 1718   UROBILINOGEN 0.2 08/18/2021 1207   UROBILINOGEN 0.2 01/15/2015 1032   NITRITE NEGATIVE 04/14/2023 1718   LEUKOCYTESUR MODERATE (A) 04/14/2023 1718   Sepsis Labs Recent Labs  Lab 04/14/23 1715 04/15/23 0549 04/16/23 0505  WBC 12.0* 12.4* 9.9   Microbiology Recent Results (from the past 240 hour(s))  Resp panel by RT-PCR (RSV, Flu A&B, Covid) Anterior Nasal Swab     Status: None   Collection Time: 04/14/23  5:30 PM    Specimen: Anterior Nasal Swab  Result Value Ref Range Status   SARS Coronavirus 2 by RT PCR NEGATIVE NEGATIVE Final    Comment: (NOTE) SARS-CoV-2 target nucleic acids are NOT DETECTED.  The SARS-CoV-2 RNA is generally detectable in upper respiratory specimens during the acute phase of infection. The lowest concentration of SARS-CoV-2 viral copies this assay can detect is 138 copies/mL. A negative result does not preclude SARS-Cov-2 infection and should not be used as the sole basis for treatment or other patient management decisions. A negative result may occur with  improper specimen collection/handling, submission of specimen other than nasopharyngeal swab, presence of viral mutation(s) within the areas targeted by this assay, and inadequate number of viral copies(<138 copies/mL). A negative result must be combined with clinical observations, patient history, and epidemiological information. The expected result is Negative.  Fact Sheet for Patients:  BloggerCourse.com  Fact Sheet for Healthcare Providers:  SeriousBroker.it  This test is no t yet approved or cleared by the [place] FDA and  has been authorized for detection and/or diagnosis of SARS-CoV-2 by FDA under an Emergency Use Authorization (EUA). This EUA will remain  in effect (meaning this test can be used) for the duration of the COVID-19 declaration under Section 564(b)(1) of the Act, 21 U.S.C.section 360bbb-3(b)(1), unless the authorization is terminated  or revoked sooner.       Influenza A by PCR NEGATIVE NEGATIVE Final   Influenza B by PCR NEGATIVE NEGATIVE Final    Comment: (NOTE) The Xpert Xpress SARS-CoV-2/FLU/RSV plus assay is intended as an aid in the diagnosis of influenza from Nasopharyngeal swab specimens and should not be used as a sole basis for treatment. Nasal washings and aspirates are unacceptable for Xpert Xpress  SARS-CoV-2/FLU/RSV testing.  Fact Sheet for Patients: BloggerCourse.com  Fact Sheet for Healthcare Providers: SeriousBroker.it  This test is not yet approved or cleared by the [place] FDA and has been authorized for detection and/or diagnosis of SARS-CoV-2 by FDA under an Emergency Use Authorization (EUA). This EUA will remain in effect (meaning this test can be used) for  the duration of the COVID-19 declaration under Section 564(b)(1) of the Act, 21 U.S.C. section 360bbb-3(b)(1), unless the authorization is terminated or revoked.     Resp Syncytial Virus by PCR NEGATIVE NEGATIVE Final    Comment: (NOTE) Fact Sheet for Patients: BloggerCourse.com  Fact Sheet for Healthcare Providers: SeriousBroker.it  This test is not yet approved or cleared by the [place] FDA and has been authorized for detection and/or diagnosis of SARS-CoV-2 by FDA under an Emergency Use Authorization (EUA). This EUA will remain in effect (meaning this test can be used) for the duration of the COVID-19 declaration under Section 564(b)(1) of the Act, 21 U.S.C. section 360bbb-3(b)(1), unless the authorization is terminated or revoked.  Performed at [facility], [address], [place], [place] [address]   Urine Culture     Status: Abnormal   Collection Time: 04/14/23  9:10 PM   Specimen: Urine, Clean Catch  Result Value Ref Range Status   Specimen Description   Final    URINE, CLEAN CATCH Performed at [facility], [address], [place], [place] [address]    Special Requests   Final    NONE Performed at [facility], [address], [place], [place] [address]    Culture >=100,000 COLONIES/mL ESCHERICHIA COLI (A)  Final   Report Status 04/16/2023 FINAL  Final   Organism ID, Bacteria ESCHERICHIA COLI (A)  Final      Susceptibility    Escherichia coli - MIC*    AMPICILLIN 4 SENSITIVE Sensitive     CEFAZOLIN <=4 SENSITIVE Sensitive     CEFEPIME <=0.12 SENSITIVE Sensitive     CEFTRIAXONE <=0.25 SENSITIVE Sensitive     CIPROFLOXACIN <=0.25 SENSITIVE Sensitive     GENTAMICIN <=1 SENSITIVE Sensitive     IMIPENEM <=0.25 SENSITIVE Sensitive     NITROFURANTOIN <=16 SENSITIVE Sensitive     TRIMETH/SULFA <=20 SENSITIVE Sensitive     AMPICILLIN/SULBACTAM <=2 SENSITIVE Sensitive     PIP/TAZO <=4 SENSITIVE Sensitive     * >=100,000 COLONIES/mL ESCHERICHIA COLI  Culture, blood (Routine X 2) w Reflex to ID Panel     Status: None (Preliminary result)   Collection Time: 04/15/23  8:34 AM   Specimen: BLOOD RIGHT HAND  Result Value Ref Range Status   Specimen Description   Final    BLOOD RIGHT HAND Performed at [facility], [address], [place], [place] [address]    Special Requests   Final    BOTTLES DRAWN AEROBIC ONLY Blood Culture adequate volume Performed at [facility], [address] [place], [place] [address]    Culture   Final    NO GROWTH 3 DAYS Performed at [facility], [address], [place], [place] [address]    Report Status PENDING  Incomplete  Culture, blood (Routine X 2) w Reflex to ID Panel     Status: None (Preliminary result)   Collection Time: 04/15/23  8:34 AM   Specimen: BLOOD RIGHT ARM  Result Value Ref Range Status   Specimen Description   Final    BLOOD RIGHT ARM Performed at [facility], [address], [place], [place] [address]    Special Requests   Final    AEROBIC BOTTLE ONLY Blood Culture results may not be optimal due to an inadequate volume of blood received in culture bottles Performed at [facility], [address] [place], [place] [address]    Culture  Final    NO GROWTH 3 DAYS Performed at [facility], [address], [place], [place] [address]    Report Status PENDING  Incomplete     Please note: You were cared for by a hospitalist during your hospital stay. Once you are discharged, your primary care physician will handle any further medical issues. Please note that NO REFILLS for any discharge medications will be authorized once you are discharged, as it is imperative that you return to your primary care physician (or establish a relationship with a primary care physician if you do not have one) for your post hospital discharge needs so that they can reassess your need for medications and monitor your lab values.    Time coordinating discharge: 40 minutes  SIGNED:   Burnadette Pop, MD  [facility] 04/18/2023, 2:16 PM Pager [phone]  If 7PM-7AM, please contact night-coverage [email or web address] Password [email or web address]

## 2023-04-19 ENCOUNTER — Telehealth: Payer: Self-pay

## 2023-04-19 LAB — CULTURE, BLOOD (ROUTINE X 2)

## 2023-04-19 NOTE — Telephone Encounter (Signed)
Pt is calling back to speak with Erskine Squibb. Please have Erskine Squibb call pt back.

## 2023-04-19 NOTE — Transitions of Care (Post Inpatient/ED Visit) (Signed)
   04/19/2023  Name: Cathyrn Deas MRN: [id number] DOB: 05/21/1951  Today's TOC FU Call Status: Today's TOC FU Call Status:: Unsuccessul Call (1st Attempt) Unsuccessful Call (1st Attempt) Date: 04/19/23  Attempted to reach the patient regarding the most recent Inpatient/ED visit.  Follow Up Plan: Additional outreach attempts will be made to reach the patient to complete the Transitions of Care (Post Inpatient/ED visit) call.   Signature   Robyne Peers, RN

## 2023-04-20 ENCOUNTER — Telehealth: Payer: Self-pay

## 2023-04-20 ENCOUNTER — Other Ambulatory Visit: Payer: Self-pay

## 2023-04-20 LAB — CULTURE, BLOOD (ROUTINE X 2)

## 2023-04-20 NOTE — Transitions of Care (Post Inpatient/ED Visit) (Signed)
   04/20/2023  Name: Samantha [name] MRN: [id number] DOB: 04-16-1951  Today's TOC FU Call Status: Today's TOC FU Call Status:: Unsuccessful Call (2nd Attempt) Unsuccessful Call (1st Attempt) Date: 04/19/23 Unsuccessful Call (2nd Attempt) Date: 04/20/23  Attempted to reach the patient regarding the most recent Inpatient/ED visit.  Follow Up Plan: Additional outreach attempts will be made to reach the patient to complete the Transitions of Care (Post Inpatient/ED visit) call.   Signature Robyne Peers, RN

## 2023-04-20 NOTE — Telephone Encounter (Signed)
Call returned to patient with assistance of Spanish interpreter [id number], message left with call back requested

## 2023-04-21 ENCOUNTER — Telehealth: Payer: Self-pay

## 2023-04-21 NOTE — Transitions of Care (Post Inpatient/ED Visit) (Signed)
   04/21/2023  Name: Samantha [name] MRN: [id number] DOB: [birth year]  Today's TOC FU Call Status: Today's TOC FU Call Status:: Unsuccessful Call (3rd Attempt) Unsuccessful Call (1st Attempt) Date: 04/19/23 Unsuccessful Call (2nd Attempt) Date: 04/20/23 Unsuccessful Call (3rd Attempt) Date: 04/21/23  Attempted to reach the patient regarding the most recent Inpatient/ED visit.  Follow Up Plan: No further outreach attempts will be made at this time. We have been unable to contact the patient.  The patient has an appointment at [facility] with Dr Alvis Lemmings 05/17/2023.   Signature Robyne Peers, RN

## 2023-05-05 ENCOUNTER — Emergency Department (HOSPITAL_COMMUNITY): Payer: Medicaid Other

## 2023-05-05 ENCOUNTER — Encounter (HOSPITAL_COMMUNITY): Payer: Self-pay | Admitting: Family Medicine

## 2023-05-05 ENCOUNTER — Inpatient Hospital Stay (HOSPITAL_COMMUNITY)
Admission: EM | Admit: 2023-05-05 | Discharge: 2023-05-28 | DRG: 291 | Disposition: A | Discharge status: Rehabilitation Facility | Payer: Medicaid Other | Attending: Internal Medicine | Admitting: Internal Medicine

## 2023-05-05 ENCOUNTER — Other Ambulatory Visit: Payer: Self-pay

## 2023-05-05 ENCOUNTER — Inpatient Hospital Stay (HOSPITAL_COMMUNITY): Payer: Medicaid Other

## 2023-05-05 DIAGNOSIS — R159 Full incontinence of feces: Secondary | ICD-10-CM | POA: Diagnosis not present

## 2023-05-05 DIAGNOSIS — Z992 Dependence on renal dialysis: Secondary | ICD-10-CM | POA: Diagnosis not present

## 2023-05-05 DIAGNOSIS — E1149 Type 2 diabetes mellitus with other diabetic neurological complication: Secondary | ICD-10-CM | POA: Diagnosis not present

## 2023-05-05 DIAGNOSIS — F32A Depression, unspecified: Secondary | ICD-10-CM | POA: Diagnosis present

## 2023-05-05 DIAGNOSIS — I5032 Chronic diastolic (congestive) heart failure: Secondary | ICD-10-CM | POA: Diagnosis not present

## 2023-05-05 DIAGNOSIS — E041 Nontoxic single thyroid nodule: Secondary | ICD-10-CM | POA: Diagnosis present

## 2023-05-05 DIAGNOSIS — R1010 Upper abdominal pain, unspecified: Secondary | ICD-10-CM | POA: Diagnosis not present

## 2023-05-05 DIAGNOSIS — I132 Hypertensive heart and chronic kidney disease with heart failure and with stage 5 chronic kidney disease, or end stage renal disease: Principal | ICD-10-CM | POA: Diagnosis present

## 2023-05-05 DIAGNOSIS — E785 Hyperlipidemia, unspecified: Secondary | ICD-10-CM | POA: Diagnosis present

## 2023-05-05 DIAGNOSIS — E872 Acidosis, unspecified: Secondary | ICD-10-CM | POA: Diagnosis present

## 2023-05-05 DIAGNOSIS — Z8249 Family history of ischemic heart disease and other diseases of the circulatory system: Secondary | ICD-10-CM

## 2023-05-05 DIAGNOSIS — K5901 Slow transit constipation: Secondary | ICD-10-CM | POA: Diagnosis not present

## 2023-05-05 DIAGNOSIS — D509 Iron deficiency anemia, unspecified: Secondary | ICD-10-CM | POA: Diagnosis not present

## 2023-05-05 DIAGNOSIS — Z8673 Personal history of transient ischemic attack (TIA), and cerebral infarction without residual deficits: Secondary | ICD-10-CM | POA: Diagnosis not present

## 2023-05-05 DIAGNOSIS — I63542 Cerebral infarction due to unspecified occlusion or stenosis of left cerebellar artery: Secondary | ICD-10-CM | POA: Diagnosis not present

## 2023-05-05 DIAGNOSIS — N186 End stage renal disease: Secondary | ICD-10-CM | POA: Diagnosis not present

## 2023-05-05 DIAGNOSIS — R140 Abdominal distension (gaseous): Secondary | ICD-10-CM | POA: Diagnosis present

## 2023-05-05 DIAGNOSIS — M62838 Other muscle spasm: Secondary | ICD-10-CM | POA: Diagnosis not present

## 2023-05-05 DIAGNOSIS — G928 Other toxic encephalopathy: Secondary | ICD-10-CM | POA: Diagnosis not present

## 2023-05-05 DIAGNOSIS — E11649 Type 2 diabetes mellitus with hypoglycemia without coma: Secondary | ICD-10-CM | POA: Diagnosis not present

## 2023-05-05 DIAGNOSIS — K8000 Calculus of gallbladder with acute cholecystitis without obstruction: Secondary | ICD-10-CM | POA: Diagnosis present

## 2023-05-05 DIAGNOSIS — Z888 Allergy status to other drugs, medicaments and biological substances status: Secondary | ICD-10-CM

## 2023-05-05 DIAGNOSIS — R7989 Other specified abnormal findings of blood chemistry: Secondary | ICD-10-CM

## 2023-05-05 DIAGNOSIS — Z90710 Acquired absence of both cervix and uterus: Secondary | ICD-10-CM

## 2023-05-05 DIAGNOSIS — Z7902 Long term (current) use of antithrombotics/antiplatelets: Secondary | ICD-10-CM

## 2023-05-05 DIAGNOSIS — R058 Other specified cough: Secondary | ICD-10-CM

## 2023-05-05 DIAGNOSIS — I951 Orthostatic hypotension: Secondary | ICD-10-CM | POA: Diagnosis not present

## 2023-05-05 DIAGNOSIS — D631 Anemia in chronic kidney disease: Secondary | ICD-10-CM | POA: Diagnosis present

## 2023-05-05 DIAGNOSIS — R0902 Hypoxemia: Secondary | ICD-10-CM

## 2023-05-05 DIAGNOSIS — N179 Acute kidney failure, unspecified: Secondary | ICD-10-CM | POA: Diagnosis present

## 2023-05-05 DIAGNOSIS — I5043 Acute on chronic combined systolic (congestive) and diastolic (congestive) heart failure: Secondary | ICD-10-CM

## 2023-05-05 DIAGNOSIS — E871 Hypo-osmolality and hyponatremia: Secondary | ICD-10-CM | POA: Diagnosis present

## 2023-05-05 DIAGNOSIS — R918 Other nonspecific abnormal finding of lung field: Secondary | ICD-10-CM

## 2023-05-05 DIAGNOSIS — Z6822 Body mass index (BMI) 22.0-22.9, adult: Secondary | ICD-10-CM

## 2023-05-05 DIAGNOSIS — Z79899 Other long term (current) drug therapy: Secondary | ICD-10-CM

## 2023-05-05 DIAGNOSIS — I272 Pulmonary hypertension, unspecified: Secondary | ICD-10-CM | POA: Diagnosis present

## 2023-05-05 DIAGNOSIS — I5033 Acute on chronic diastolic (congestive) heart failure: Secondary | ICD-10-CM | POA: Diagnosis present

## 2023-05-05 DIAGNOSIS — R531 Weakness: Secondary | ICD-10-CM

## 2023-05-05 DIAGNOSIS — E1151 Type 2 diabetes mellitus with diabetic peripheral angiopathy without gangrene: Secondary | ICD-10-CM | POA: Diagnosis present

## 2023-05-05 DIAGNOSIS — Z91148 Patient's other noncompliance with medication regimen for other reason: Secondary | ICD-10-CM

## 2023-05-05 DIAGNOSIS — K5900 Constipation, unspecified: Secondary | ICD-10-CM | POA: Diagnosis present

## 2023-05-05 DIAGNOSIS — I10 Essential (primary) hypertension: Secondary | ICD-10-CM | POA: Diagnosis not present

## 2023-05-05 DIAGNOSIS — R34 Anuria and oliguria: Secondary | ICD-10-CM | POA: Diagnosis present

## 2023-05-05 DIAGNOSIS — J9601 Acute respiratory failure with hypoxia: Secondary | ICD-10-CM | POA: Diagnosis present

## 2023-05-05 DIAGNOSIS — G9341 Metabolic encephalopathy: Secondary | ICD-10-CM | POA: Diagnosis not present

## 2023-05-05 DIAGNOSIS — E119 Type 2 diabetes mellitus without complications: Secondary | ICD-10-CM | POA: Diagnosis not present

## 2023-05-05 DIAGNOSIS — E8809 Other disorders of plasma-protein metabolism, not elsewhere classified: Secondary | ICD-10-CM | POA: Diagnosis present

## 2023-05-05 DIAGNOSIS — T501X6A Underdosing of loop [high-ceiling] diuretics, initial encounter: Secondary | ICD-10-CM | POA: Diagnosis present

## 2023-05-05 DIAGNOSIS — Z7984 Long term (current) use of oral hypoglycemic drugs: Secondary | ICD-10-CM

## 2023-05-05 DIAGNOSIS — Z1152 Encounter for screening for COVID-19: Secondary | ICD-10-CM | POA: Diagnosis not present

## 2023-05-05 DIAGNOSIS — E118 Type 2 diabetes mellitus with unspecified complications: Secondary | ICD-10-CM | POA: Diagnosis not present

## 2023-05-05 DIAGNOSIS — R4182 Altered mental status, unspecified: Secondary | ICD-10-CM | POA: Diagnosis not present

## 2023-05-05 DIAGNOSIS — N184 Chronic kidney disease, stage 4 (severe): Secondary | ICD-10-CM

## 2023-05-05 DIAGNOSIS — I2510 Atherosclerotic heart disease of native coronary artery without angina pectoris: Secondary | ICD-10-CM | POA: Diagnosis present

## 2023-05-05 DIAGNOSIS — K8020 Calculus of gallbladder without cholecystitis without obstruction: Secondary | ICD-10-CM | POA: Diagnosis not present

## 2023-05-05 DIAGNOSIS — E1122 Type 2 diabetes mellitus with diabetic chronic kidney disease: Secondary | ICD-10-CM | POA: Diagnosis present

## 2023-05-05 DIAGNOSIS — E876 Hypokalemia: Secondary | ICD-10-CM | POA: Diagnosis not present

## 2023-05-05 DIAGNOSIS — F419 Anxiety disorder, unspecified: Secondary | ICD-10-CM | POA: Diagnosis present

## 2023-05-05 DIAGNOSIS — E669 Obesity, unspecified: Secondary | ICD-10-CM | POA: Diagnosis present

## 2023-05-05 DIAGNOSIS — J90 Pleural effusion, not elsewhere classified: Secondary | ICD-10-CM

## 2023-05-05 DIAGNOSIS — R5381 Other malaise: Secondary | ICD-10-CM | POA: Diagnosis not present

## 2023-05-05 DIAGNOSIS — Z8719 Personal history of other diseases of the digestive system: Secondary | ICD-10-CM | POA: Diagnosis not present

## 2023-05-05 DIAGNOSIS — R569 Unspecified convulsions: Secondary | ICD-10-CM | POA: Diagnosis not present

## 2023-05-05 DIAGNOSIS — E1165 Type 2 diabetes mellitus with hyperglycemia: Secondary | ICD-10-CM | POA: Diagnosis present

## 2023-05-05 DIAGNOSIS — I2489 Other forms of acute ischemic heart disease: Secondary | ICD-10-CM | POA: Diagnosis present

## 2023-05-05 DIAGNOSIS — R112 Nausea with vomiting, unspecified: Secondary | ICD-10-CM | POA: Diagnosis not present

## 2023-05-05 DIAGNOSIS — Z794 Long term (current) use of insulin: Secondary | ICD-10-CM | POA: Diagnosis not present

## 2023-05-05 LAB — CBC
HCT: 31.5 % — ABNORMAL LOW (ref 36.0–46.0)
Hemoglobin: 10.6 g/dL — ABNORMAL LOW (ref 12.0–15.0)
MCH: 31.3 pg (ref 26.0–34.0)
MCHC: 33.7 g/dL (ref 30.0–36.0)
MCV: 92.9 fL (ref 80.0–100.0)
Platelets: 390 10*3/uL (ref 150–400)
RBC: 3.39 MIL/uL — ABNORMAL LOW (ref 3.87–5.11)
RDW: 12.6 % (ref 11.5–15.5)
WBC: 7.5 10*3/uL (ref 4.0–10.5)
nRBC: 0 % (ref 0.0–0.2)

## 2023-05-05 LAB — COMPREHENSIVE METABOLIC PANEL
ALT: 17 U/L (ref 0–44)
AST: 21 U/L (ref 15–41)
Albumin: 3.6 g/dL (ref 3.5–5.0)
Alkaline Phosphatase: 67 U/L (ref 38–126)
Anion gap: 11 (ref 5–15)
BUN: 25 mg/dL — ABNORMAL HIGH (ref 8–23)
CO2: 20 mmol/L — ABNORMAL LOW (ref 22–32)
Calcium: 8.7 mg/dL — ABNORMAL LOW (ref 8.9–10.3)
Chloride: 102 mmol/L (ref 98–111)
Creatinine, Ser: 2.34 mg/dL — ABNORMAL HIGH (ref 0.44–1.00)
GFR, Estimated: 22 mL/min — ABNORMAL LOW (ref >60)
Glucose, Bld: 201 mg/dL — ABNORMAL HIGH (ref 70–99)
Potassium: 3.4 mmol/L — ABNORMAL LOW (ref 3.5–5.1)
Sodium: 133 mmol/L — ABNORMAL LOW (ref 135–145)
Total Bilirubin: 0.8 mg/dL (ref 0.3–1.2)
Total Protein: 7 g/dL (ref 6.5–8.1)

## 2023-05-05 LAB — URINALYSIS, ROUTINE W REFLEX MICROSCOPIC
Bacteria, UA: NONE SEEN
Bilirubin Urine: NEGATIVE
Glucose, UA: 50 mg/dL — AB
Hgb urine dipstick: NEGATIVE
Ketones, ur: NEGATIVE mg/dL
Leukocytes,Ua: NEGATIVE
Nitrite: NEGATIVE
Protein, ur: 100 mg/dL — AB
Specific Gravity, Urine: 1.01 (ref 1.005–1.030)
pH: 6 (ref 5.0–8.0)

## 2023-05-05 LAB — CULTURE, BLOOD (ROUTINE X 2)

## 2023-05-05 LAB — BRAIN NATRIURETIC PEPTIDE: B Natriuretic Peptide: 601.4 pg/mL — ABNORMAL HIGH (ref 0.0–100.0)

## 2023-05-05 LAB — NA AND K (SODIUM & POTASSIUM), RAND UR
Potassium Urine: 34 mmol/L
Sodium, Ur: 13 mmol/L

## 2023-05-05 LAB — CREATININE, URINE, RANDOM: Creatinine, Urine: 86 mg/dL

## 2023-05-05 LAB — TROPONIN I (HIGH SENSITIVITY)
Troponin I (High Sensitivity): 51 ng/L — ABNORMAL HIGH (ref <18)
Troponin I (High Sensitivity): 49 ng/L — ABNORMAL HIGH (ref <18)

## 2023-05-05 LAB — SARS CORONAVIRUS 2 BY RT PCR: SARS Coronavirus 2 by RT PCR: NEGATIVE

## 2023-05-05 LAB — LIPASE, BLOOD: Lipase: 46 U/L (ref 11–51)

## 2023-05-05 LAB — GLUCOSE, CAPILLARY: Glucose-Capillary: 177 mg/dL — ABNORMAL HIGH (ref 70–99)

## 2023-05-05 MED ORDER — SODIUM CHLORIDE 0.9 % IV SOLN
250.0000 mL | INTRAVENOUS | Status: DC | PRN
  Administered 2023-05-08: 250 mL via INTRAVENOUS

## 2023-05-05 MED ORDER — POTASSIUM CHLORIDE 10 MEQ/100ML IV SOLN
10.0000 meq | Freq: Once | INTRAVENOUS | Status: AC
  Administered 2023-05-05: 10 meq via INTRAVENOUS
  Filled 2023-05-05: qty 100

## 2023-05-05 MED ORDER — SODIUM CHLORIDE 0.9 % IV SOLN
1.0000 g | Freq: Once | INTRAVENOUS | Status: AC
  Administered 2023-05-05: 1 g via INTRAVENOUS
  Filled 2023-05-05: qty 10

## 2023-05-05 MED ORDER — SODIUM BICARBONATE 650 MG PO TABS
650.0000 mg | ORAL_TABLET | Freq: Three times a day (TID) | ORAL | Status: DC
  Administered 2023-05-05 – 2023-05-08 (×8): 650 mg via ORAL
  Filled 2023-05-05 (×8): qty 1

## 2023-05-05 MED ORDER — HYDROMORPHONE HCL 1 MG/ML IJ SOLN
0.5000 mg | Freq: Once | INTRAMUSCULAR | Status: AC
  Administered 2023-05-05: 0.5 mg via INTRAVENOUS
  Filled 2023-05-05: qty 1

## 2023-05-05 MED ORDER — SODIUM CHLORIDE (PF) 0.9 % IJ SOLN
INTRAMUSCULAR | Status: AC
  Filled 2023-05-05: qty 50

## 2023-05-05 MED ORDER — SODIUM CHLORIDE 0.9% FLUSH: 3.0000 mL | INTRAVENOUS | Status: DC | PRN

## 2023-05-05 MED ORDER — CARVEDILOL 25 MG PO TABS
25.0000 mg | ORAL_TABLET | Freq: Two times a day (BID) | ORAL | Status: DC
  Administered 2023-05-06 – 2023-05-12 (×13): 25 mg via ORAL
  Filled 2023-05-05 (×13): qty 1

## 2023-05-05 MED ORDER — FUROSEMIDE 10 MG/ML IJ SOLN
40.0000 mg | Freq: Once | INTRAMUSCULAR | Status: AC
  Administered 2023-05-05: 40 mg via INTRAVENOUS
  Filled 2023-05-05: qty 4

## 2023-05-05 MED ORDER — SODIUM CHLORIDE 0.9 % IV SOLN
500.0000 mg | Freq: Once | INTRAVENOUS | Status: AC
  Administered 2023-05-05: 500 mg via INTRAVENOUS
  Filled 2023-05-05: qty 5

## 2023-05-05 MED ORDER — HEPARIN SODIUM (PORCINE) 5000 UNIT/ML IJ SOLN
5000.0000 [IU] | Freq: Three times a day (TID) | INTRAMUSCULAR | Status: DC
  Administered 2023-05-05 – 2023-05-08 (×7): 5000 [IU] via SUBCUTANEOUS
  Filled 2023-05-05 (×7): qty 1

## 2023-05-05 MED ORDER — LACTATED RINGERS IV BOLUS
1000.0000 mL | Freq: Once | INTRAVENOUS | Status: AC
  Administered 2023-05-05: 1000 mL via INTRAVENOUS

## 2023-05-05 MED ORDER — INSULIN ASPART PROT & ASPART (70-30 MIX) 100 UNIT/ML ~~LOC~~ SUSP
20.0000 [IU] | Freq: Two times a day (BID) | SUBCUTANEOUS | Status: DC
  Administered 2023-05-06 (×2): 20 [IU] via SUBCUTANEOUS
  Filled 2023-05-05: qty 10

## 2023-05-05 MED ORDER — SODIUM CHLORIDE 0.9% FLUSH
3.0000 mL | Freq: Two times a day (BID) | INTRAVENOUS | Status: DC
  Administered 2023-05-05 – 2023-05-28 (×41): 3 mL via INTRAVENOUS

## 2023-05-05 MED ORDER — ONDANSETRON HCL 4 MG PO TABS
4.0000 mg | ORAL_TABLET | Freq: Four times a day (QID) | ORAL | Status: DC | PRN
  Administered 2023-05-13 – 2023-05-18 (×3): 4 mg via ORAL
  Filled 2023-05-05 (×3): qty 1

## 2023-05-05 MED ORDER — ONDANSETRON HCL 4 MG/2ML IJ SOLN
4.0000 mg | Freq: Once | INTRAMUSCULAR | Status: AC
  Administered 2023-05-05: 4 mg via INTRAVENOUS
  Filled 2023-05-05: qty 2

## 2023-05-05 MED ORDER — INSULIN ISOPHANE & REGULAR (HUMAN 70-30)100 UNIT/ML KWIKPEN: 20.0000 [IU] | PEN_INJECTOR | Freq: Two times a day (BID) | SUBCUTANEOUS | Status: DC

## 2023-05-05 MED ORDER — ONDANSETRON HCL 4 MG/2ML IJ SOLN
4.0000 mg | Freq: Four times a day (QID) | INTRAMUSCULAR | Status: DC | PRN
  Administered 2023-05-06 – 2023-05-27 (×11): 4 mg via INTRAVENOUS
  Filled 2023-05-05 (×9): qty 2

## 2023-05-05 MED ORDER — CLOPIDOGREL BISULFATE 75 MG PO TABS
75.0000 mg | ORAL_TABLET | Freq: Every day | ORAL | Status: DC
  Administered 2023-05-05 – 2023-05-20 (×15): 75 mg via ORAL
  Filled 2023-05-05 (×16): qty 1

## 2023-05-05 MED ORDER — INSULIN ASPART 100 UNIT/ML IJ SOLN
0.0000 [IU] | Freq: Three times a day (TID) | INTRAMUSCULAR | Status: DC
  Administered 2023-05-06: 2 [IU] via SUBCUTANEOUS
  Administered 2023-05-06: 1 [IU] via SUBCUTANEOUS
  Administered 2023-05-08: 2 [IU] via SUBCUTANEOUS
  Administered 2023-05-09: 5 [IU] via SUBCUTANEOUS
  Administered 2023-05-09: 2 [IU] via SUBCUTANEOUS
  Administered 2023-05-09: 1 [IU] via SUBCUTANEOUS
  Administered 2023-05-10: 5 [IU] via SUBCUTANEOUS
  Administered 2023-05-10: 2 [IU] via SUBCUTANEOUS

## 2023-05-05 NOTE — ED Notes (Signed)
One set of blood cultures obtained, second set did not give enough blood. Dr. Denton Lank notified to see if he wants antibiotics started

## 2023-05-05 NOTE — ED Notes (Signed)
Patient transported to CT 

## 2023-05-05 NOTE — ED Notes (Signed)
Lab called to add on BNP to prior blood work

## 2023-05-05 NOTE — ED Triage Notes (Addendum)
Pt arrived via POV. Pt c/o generalized abd and chest pain for several weeks. Pt went 8 days w/o BM, but had one last night. Pain is causing SOB.  AOx4

## 2023-05-05 NOTE — ED Provider Notes (Addendum)
EMERGENCY DEPARTMENT AT [facility] Provider Note   CSN: [id number] Arrival date & time: 05/05/23  0840     History  Chief Complaint  Patient presents with   Abdominal Pain   Chest Pain    Samantha [name] is a 72 y.o. female.  Pt with pain to upper abd/lower chest, nausea, decrease appetite, non prod cough in past couple weeks. Was hospitalized last month with uti, aki/ckd, dehydration - states since d/c has never felt well. No fevers. No exertional chest pain or discomfort. No pleuritic chest pain. No vomiting or diarrhea. Had been constipated but had bm last night. Denies dysuria. No extremity pain or swelling.   The history is provided by the patient, a relative and medical records. A language interpreter was used.  Abdominal Pain Associated symptoms: chest pain, cough and nausea   Associated symptoms: no chills, no diarrhea, no dysuria, no fever, no shortness of breath, no sore throat and no vomiting   Chest Pain Associated symptoms: abdominal pain, cough and nausea   Associated symptoms: no back pain, no fever, no headache, no palpitations, no shortness of breath and no vomiting        Home Medications Prior to Admission medications   Medication Sig Start Date End Date Taking? Authorizing Provider  amLODipine (NORVASC) 10 MG tablet Take 1 tablet (10 mg total) by mouth daily. 04/12/23   Hoy Register, MD  atorvastatin (LIPITOR) 80 MG tablet Take 1 tablet (80 mg total) by mouth daily. 04/12/23   Hoy Register, MD  carvedilol (COREG) 25 MG tablet Take 1 tablet (25 mg total) by mouth 2 (two) times daily with a meal. 04/12/23   Hoy Register, MD  clopidogrel (PLAVIX) 75 MG tablet Take 1 tablet (75 mg total) by mouth daily. 04/12/23   Hoy Register, MD  furosemide (LASIX) 40 MG tablet Take 1 tablet (40 mg total) by mouth daily for 7 days. 04/18/23 04/25/23  Burnadette Pop, MD  glimepiride (AMARYL) 4 MG tablet Take 1 tablet (4 mg total) by mouth daily  with breakfast. 04/12/23   Hoy Register, MD  glucose blood test strip Use 3 times daily before meals 07/21/18   Hoy Register, MD  hydrALAZINE (APRESOLINE) 50 MG tablet Take 1 tablet (50 mg total) by mouth every 8 (eight) hours. 04/18/23   Burnadette Pop, MD  ibuprofen (ADVIL) 200 MG tablet Take 200 mg by mouth every 6 (six) hours as needed for mild pain or headache.    [provider]  insulin isophane & regular human KwikPen (HUMULIN 70/30 KWIKPEN) (70-30) 100 UNIT/ML KwikPen Inject 30 Units into the skin in the morning AND 25 Units every evening. Patient taking differently: Inject 30 Units into the skin in the morning AND 26 Units every evening. 04/12/23   Hoy Register, MD  Insulin Syringe-Needle U-100 (BD INSULIN SYRINGE ULTRAFINE) 31G X 15/64" 0.5 ML MISC Use subcutaneously twice daily 01/28/17   Hoy Register, MD  sodium bicarbonate 650 MG tablet Take 1 tablet (650 mg total) by mouth 3 (three) times daily. 04/18/23   Burnadette Pop, MD  TRUEPLUS LANCETS 28G MISC 1 each by Does not apply route 3 (three) times daily before meals. 12/23/16   Hoy Register, MD      Allergies    Perflutren    Review of Systems   Review of Systems  Constitutional:  Negative for chills and fever.  HENT:  Negative for sore throat.   Eyes:  Negative for redness.  Respiratory:  Positive for  cough. Negative for shortness of breath.   Cardiovascular:  Positive for chest pain. Negative for palpitations and leg swelling.  Gastrointestinal:  Positive for abdominal pain and nausea. Negative for diarrhea and vomiting.  Genitourinary:  Negative for dysuria and flank pain.  Musculoskeletal:  Negative for back pain and neck pain.  Skin:  Negative for rash.  Neurological:  Negative for headaches.  Hematological:  Does not bruise/bleed easily.  Psychiatric/Behavioral:  Negative for confusion.     Physical Exam Updated Vital Signs BP (!) 177/83   Pulse 79   Temp 98.3 F (36.8 C) (Oral)   Resp 13    Ht 1.651 m (5\' 5" )   SpO2 94%   BMI 26.09 kg/m  Physical Exam Vitals and nursing note reviewed.  Constitutional:      Appearance: Normal appearance. She is well-developed.  HENT:     Head: Atraumatic.     Nose: Nose normal.     Mouth/Throat:     Mouth: Mucous membranes are moist.  Eyes:     General: No scleral icterus.    Conjunctiva/sclera: Conjunctivae normal.  Neck:     Trachea: No tracheal deviation.  Cardiovascular:     Rate and Rhythm: Normal rate and regular rhythm.     Pulses: Normal pulses.     Heart sounds: Normal heart sounds. No murmur heard.    No friction rub. No gallop.  Pulmonary:     Effort: Pulmonary effort is normal. No respiratory distress.     Breath sounds: Rales present.     Comments: Rales base of lung Abdominal:     General: Bowel sounds are normal. There is no distension.     Palpations: Abdomen is soft.     Tenderness: There is abdominal tenderness. There is no guarding.     Comments: Upper abd tenderness.   Genitourinary:    Comments: No cva tenderness.  Musculoskeletal:        General: No swelling or tenderness.     Cervical back: Normal range of motion and neck supple. No rigidity. No muscular tenderness.  Skin:    General: Skin is warm and dry.     Findings: No rash.  Neurological:     Mental Status: She is alert.     Comments: Alert, speech normal.   Psychiatric:        Mood and Affect: Mood normal.     ED Results / Procedures / Treatments   Labs (all labs ordered are listed, but only abnormal results are displayed) Results for orders placed or performed during the hospital encounter of 05/05/23  SARS Coronavirus 2 by RT PCR (hospital order, performed in [facility] hospital lab) *cepheid single result test* Anterior Nasal Swab   Specimen: Anterior Nasal Swab  Result Value Ref Range   SARS Coronavirus 2 by RT PCR NEGATIVE NEGATIVE  Lipase, blood  Result Value Ref Range   Lipase 46 11 - 51 U/L  Comprehensive metabolic panel   Result Value Ref Range   Sodium 133 (L) 135 - 145 mmol/L   Potassium 3.4 (L) 3.5 - 5.1 mmol/L   Chloride 102 98 - 111 mmol/L   CO2 20 (L) 22 - 32 mmol/L   Glucose, Bld 201 (H) 70 - 99 mg/dL   BUN 25 (H) 8 - 23 mg/dL   Creatinine, Ser 1.61 (H) 0.44 - 1.00 mg/dL   Calcium 8.7 (L) 8.9 - 10.3 mg/dL   Total Protein 7.0 6.5 - 8.1 g/dL   Albumin 3.6 3.5 -  5.0 g/dL   AST 21 15 - 41 U/L   ALT 17 0 - 44 U/L   Alkaline Phosphatase 67 38 - 126 U/L   Total Bilirubin 0.8 0.3 - 1.2 mg/dL   GFR, Estimated 22 (L) >60 mL/min   Anion gap 11 5 - 15  CBC  Result Value Ref Range   WBC 7.5 4.0 - 10.5 K/uL   RBC 3.39 (L) 3.87 - 5.11 MIL/uL   Hemoglobin 10.6 (L) 12.0 - 15.0 g/dL   HCT 16.1 (L) 09.6 - 04.5 %   MCV 92.9 80.0 - 100.0 fL   MCH 31.3 26.0 - 34.0 pg   MCHC 33.7 30.0 - 36.0 g/dL   RDW 40.9 81.1 - 91.4 %   Platelets 390 150 - 400 K/uL   nRBC 0.0 0.0 - 0.2 %  Urinalysis, Routine w reflex microscopic -Urine, Clean Catch  Result Value Ref Range   Color, Urine YELLOW YELLOW   APPearance CLEAR CLEAR   Specific Gravity, Urine 1.010 1.005 - 1.030   pH 6.0 5.0 - 8.0   Glucose, UA 50 (A) NEGATIVE mg/dL   Hgb urine dipstick NEGATIVE NEGATIVE   Bilirubin Urine NEGATIVE NEGATIVE   Ketones, ur NEGATIVE NEGATIVE mg/dL   Protein, ur 782 (A) NEGATIVE mg/dL   Nitrite NEGATIVE NEGATIVE   Leukocytes,Ua NEGATIVE NEGATIVE   RBC / HPF 0-5 0 - 5 RBC/hpf   WBC, UA 0-5 0 - 5 WBC/hpf   Bacteria, UA NONE SEEN NONE SEEN   Squamous Epithelial / HPF 0-5 0 - 5 /HPF  Troponin I (High Sensitivity)  Result Value Ref Range   Troponin I (High Sensitivity) 51 (H) <18 ng/L  Troponin I (High Sensitivity)  Result Value Ref Range   Troponin I (High Sensitivity) 49 (H) <18 ng/L   CT CHEST ABDOMEN PELVIS WO CONTRAST  Result Date: 05/05/2023 CLINICAL DATA:  Sepsis.  Increasing O2 requirements. EXAM: CT CHEST, ABDOMEN AND PELVIS WITHOUT CONTRAST TECHNIQUE: Multidetector CT imaging of the chest, abdomen and pelvis was  performed following the standard protocol without IV contrast. RADIATION DOSE REDUCTION: This exam was performed according to the departmental dose-optimization program which includes automated exposure control, adjustment of the mA and/or kV according to patient size and/or use of iterative reconstruction technique. COMPARISON:  Chest x-ray earlier 05/05/2023. Ultrasound abdomen 05/05/2023. CT scan 04/14/2023. FINDINGS: CT CHEST FINDINGS Cardiovascular: Heart is nonenlarged. Trace pericardial fluid or thickening. Coronary artery calcifications are seen. The thoracic aorta has a normal course and caliber with scattered vascular calcifications. Bovine type aortic arch. Significant calcified plaque of the left subclavian artery. Mediastinum/Nodes: Small hiatal hernia. Normal caliber thoracic esophagus. Heterogeneous small thyroid gland. No specific abnormal lymph node enlargement seen in the axillary region or hila on this noncontrast exam. There are some prominent less than 1 cm in size in short axis mediastinal nodes, nonpathologic by size criteria. Example right of midline, precarinal measures 8 mm in short axis on series 2, image 23. These nodes are slightly more numerous than usually seen however. Lungs/Pleura: Developing small to moderate bilateral pleural effusions, right-greater-than-left. Adjacent parenchymal opacities in the lower lobes. Consolidation is possible. There are some patchy ground-glass and interstitial changes seen along the upper lung zones with thickening of the interstitial septa. Please correlate for a component of edema. No pneumothorax. Musculoskeletal: Mild degenerative changes along the spine. CT ABDOMEN PELVIS FINDINGS Hepatobiliary: Dilated gallbladder with some dependent stones. This was seen previously. Please correlate with separate ultrasound from 05/05/2023 earlier preserved hepatic parenchyma on  this noncontrast examination otherwise. Pancreas: Moderate atrophy of the pancreas.   No obvious mass. Spleen: Splenic granulomas. Adrenals/Urinary Tract: The adrenal glands are preserved. Mild bilateral renal atrophy with some perinephric stranding. Multiple calcifications along each kidney could be vascular rather than collecting system. No collecting system dilatation. No abnormal calcification seen within either ureter. Bladder is underdistended. There are low-lying pelvic structures. Please correlate for prolapse. Stomach/Bowel: On this non oral contrast exam, the large bowel has a normal course and caliber with scattered colonic stool. The stomach and small bowel are nondilated. Normal appendix in the right lower quadrant extending inferior to the cecum. Vascular/Lymphatic: Diffuse vascular calcifications identified particularly of branch vessels. Normal caliber aorta and IVC. No specific abnormal lymph node enlargement identified in the abdomen and pelvis. Reproductive: Status post hysterectomy. No adnexal masses. Other: Small focal wide mouth hernia seen along the anterior pelvic wall in the right side lateral to the margin of the right rectus abdominis muscle containing a loop of small bowel. No obstruction. This is unchanged from previous. Small fat containing umbilical hernia. Musculoskeletal: Scattered degenerative changes seen of the spine and pelvis. IMPRESSION: New bilateral pleural effusions, right-greater-than-left with the adjacent parenchymal opacities. Infiltrates are possible and recommend follow-up. There is also a component of interstitial septal thickening which is new. A component of edema is possible. No bowel obstruction, free air or free fluid with scattered stool. No obstructing renal stones.  Mild bilateral renal atrophy. Dilated gallbladder with a dependent stone. No adjacent inflammatory changes by noncontrast CT. Please correlate with the prior ultrasound. If there is further concern, HIDA scan may be of some benefit. Significant diffuse vascular calcifications. 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 Please correlate with any particular symptoms. Electronically Signed   By: Karen Kays M.D.   On: 05/05/2023 14:58   US Abdomen Limited RUQ (LIVER/GB)  Result Date: 05/05/2023 CLINICAL DATA:  Right upper quadrant pain for several weeks. EXAM: ULTRASOUND ABDOMEN LIMITED RIGHT UPPER QUADRANT COMPARISON:  04/14/2023 FINDINGS: Gallbladder: Gallbladder appears distended, similar to CT from 04/14/2023. Multiple stones are identified measuring up to 8 mm. No gallbladder wall thickening, pericholecystic fluid, or sludge. Negative sonographic Murphy's sign (note: patient received pain medication prior to ultrasound). Common bile duct: Diameter: 5.6 mm Liver: No focal lesion identified. Mild increased parenchymal echogenicity. Portal vein is patent on color Doppler imaging with normal direction of blood flow towards the liver. Other: None. IMPRESSION: 1. Gallstones. No gallbladder wall thickening, pericholecystic fluid, or sludge. Negative sonographic Murphy's sign (note: patient received pain medication prior to ultrasound). 2. Mild increased hepatic parenchymal echogenicity suggestive of steatosis. Electronically Signed   By: Signa Kell M.D.   On: 05/05/2023 11:56   DG Chest Port 1 View  Result Date: 05/05/2023 CLINICAL DATA:  Generalized chest pain, shortness of breath, and cough EXAM: PORTABLE CHEST 1 VIEW COMPARISON:  Chest radiograph dated 04/17/2023 FINDINGS: Low lung volumes with bronchovascular crowding. Bilateral perihilar interstitial opacities and bibasilar patchy opacities. Increased moderate bilateral pleural effusions. No pneumothorax. The heart size and mediastinal contours are within normal limits. No acute osseous abnormality. IMPRESSION: 1. Pulmonary edema with increased moderate bilateral pleural effusions. 2. Bibasilar patchy opacities, likely atelectasis. Aspiration or pneumonia can be considered in the  appropriate clinical setting. Electronically Signed   By: Agustin Cree M.D.   On: 05/05/2023 11:38   DG CHEST PORT 1 VIEW  Result Date: 04/17/2023 CLINICAL DATA:  Cough. EXAM: PORTABLE CHEST 1 VIEW COMPARISON:  Chest x-ray 04/16/2023 FINDINGS: The cardiac silhouette, mediastinal and hilar contours are within normal limits given the AP projection and portable technique. Stable tortuosity and calcification of the thoracic aorta. Peribronchial thickening and increased interstitial markings could suggest interstitial pulmonary edema or bronchitis. No pleural effusions or focal airspace consolidation. No pneumothorax. IMPRESSION: Peribronchial thickening and increased interstitial markings could suggest interstitial pulmonary edema or bronchitis. Electronically Signed   By: Rudie Meyer M.D.   On: 04/17/2023 10:32   DG CHEST PORT 1 VIEW  Result Date: 04/16/2023 CLINICAL DATA:  Cough. EXAM: PORTABLE CHEST 1 VIEW COMPARISON:  04/14/2023 FINDINGS: Lungs are somewhat hypoinflated demonstrate hazy prominence of the central pulmonary vessels likely representing mild vascular congestion. Slight increased density in the right infrahilar region could be due to superimposed airspace process. No definite effusion. No pneumothorax. Mild stable cardiomegaly. Remainder of the exam is unchanged. IMPRESSION: 1. Slight increased density in the right infrahilar region which could be due to superimposed airspace process such as atelectasis or infection. Consider PA and lateral chest radiograph for better evaluation. 2. Mild stable cardiomegaly with suggestion of mild vascular congestion. Electronically Signed   By: Elberta Fortis M.D.   On: 04/16/2023 10:41   CT CHEST ABDOMEN PELVIS WO CONTRAST  Result Date: 04/14/2023 CLINICAL DATA:  Abnormal chest x-ray, and left lower quadrant abdominal pain. EXAM: CT CHEST, ABDOMEN AND PELVIS WITHOUT CONTRAST TECHNIQUE: Multidetector CT imaging of the chest, abdomen and pelvis was performed  following the standard protocol without IV contrast. RADIATION DOSE REDUCTION: This exam was performed according to the departmental dose-optimization program which includes automated exposure control, adjustment of the mA and/or kV according to patient size and/or use of iterative reconstruction technique. COMPARISON:  Portable chest today, portable chest 01/10/2015. No prior body CT for comparison. FINDINGS: CT CHEST FINDINGS Cardiovascular: The heart is upper limit of normal in size. There is no pericardial effusion. There are calcifications in the left main, lad and circumflex coronary arteries. The pulmonary arteries and veins are normal caliber. There are moderate patchy calcifications in the arch and descending aorta and proximal left subclavian artery, and a two-vessel aortic arch with normal variant brachiobicarotid trunk. No aortic aneurysm is seen, with mild tortuosity in the descending segment. There is suspected at least 60-70% calcific stenosis in the proximal 1 cm of the left subclavian artery. Mediastinum/Nodes: Thyroid gland, axillary spaces are unremarkable. There are slightly prominent right of midline precarinal, and subcarinal lymph nodes up to 1 cm in short axis, similar sized single right mid hilar lymph node, but there is no further adenopathy. There is a small hiatal hernia with unremarkable thoracic esophagus. No abnormality is seen within the thoracic trachea and main bronchi apart from mucoid septations. There are also tracheobronchial chondral calcifications. Lungs/Pleura: There is diffuse bronchial thickening. No bronchial plugging is seen. There is subpleural reticulation in the mid to lower lung fields, without honeycombing, findings consistent with mild interstitial lung disease. There are mild atelectatic changes of the posterior lungs, without evidence of focal pneumonia. No lung nodules are seen. No pleural effusion, thickening or pneumothorax. Musculoskeletal: There are  degenerative changes of the spine and shoulders. No acute or significant osseous findings or chest wall mass. CT ABDOMEN PELVIS FINDINGS Hepatobiliary: There are vascular calcifications at the hepatic hilum and a few tiny calcified granulomas in the liver. The liver is otherwise unremarkable. There is mild dilatation of the gallbladder and a few small stones in the proximal lumen, but no appreciable wall thickening, pericholecystic edema or  biliary dilatation. Pancreas: There is mild glandular atrophy. No focal mass is seen without contrast. There are either scattered vascular calcifications in the parenchyma or calcifications of chronic pancreatitis, but no acute inflammation is evident. Spleen: There are calcified granulomas. Patchy heavy calcification splenic artery. No splenomegaly or focal mass is seen without contrast. Adrenals/Urinary Tract: There is no adrenal mass. There is no focal abnormality of the unenhanced kidneys. Perinephric stranding symmetric and likely on a senescent basis. There are renovascular calcifications extending along both renal hila. No collecting system stones are seen and no hydronephrosis or ureteral stones. The bladder is contracted and not well seen but no adjacent inflammatory reaction or intravesical stones are noted. Stomach/Bowel: No dilatation or wall thickening including the appendix. There is sigmoid diverticulosis but no findings of acute diverticulitis. Mild fecal stasis. Vascular/Lymphatic: There is heavy aortoiliac and visceral branch vessel atherosclerosis. No AAA. No abdominopelvic adenopathy. Reproductive: Status post hysterectomy. No adnexal masses. Other: There is a small umbilical fat hernia. There is no incarcerated hernia. There is no free fluid, free hemorrhage or free air. Musculoskeletal: Mild osteopenia and degenerative change lumbar spine, spurring of the SI joints, and bridging osteophytes over the left inferior SI joint. No acute or other significant  osseous findings. IMPRESSION: 1. Bronchitis without evidence of pneumonia. 2. Subpleural reticulation in the mid to lower lung fields consistent with mild interstitial lung disease. 3. Aortic and coronary artery atherosclerosis. Visceral branch vessel atherosclerosis in the abdomen. 4. 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 5. Small hiatal hernia. 6. Cholelithiasis with mild gallbladder dilatation but no wall thickening or pericholecystic edema. 7. Scattered tiny calcifications in the pancreas, could be vascular or due to chronic calcific pancreatitis. No acute peripancreatic edema. 8. Constipation and diverticulosis. No bowel obstruction or inflammation. 9. Umbilical fat hernia. 10. Osteopenia and degenerative change. Electronically Signed   By: Almira Bar M.D.   On: 04/14/2023 20:28   CT Head Wo Contrast  Result Date: 04/14/2023 CLINICAL DATA:  Delirium. EXAM: CT HEAD WITHOUT CONTRAST CT CERVICAL SPINE WITHOUT CONTRAST TECHNIQUE: Multidetector CT imaging of the head and cervical spine was performed following the standard protocol without intravenous contrast. Multiplanar CT image reconstructions of the cervical spine were also generated. RADIATION DOSE REDUCTION: This exam was performed according to the departmental dose-optimization program which includes automated exposure control, adjustment of the mA and/or kV according to patient size and/or use of iterative reconstruction technique. COMPARISON:  Head CT dated 11/08/2021. FINDINGS: CT HEAD FINDINGS Brain: The ventricles and sulci appropriate size for patient's age. Small area of infarct in the left cerebellar hemisphere, small left cerebellar old infarct. Subcentimeter right basal ganglia hypodense focus, likely a small old lacunar infarct. There is no acute intracranial hemorrhage. No mass effect or midline shift. No extra-axial fluid collection. Vascular: No hyperdense vessel or unexpected calcification. Skull:  Normal. Negative for fracture or focal lesion. Sinuses/Orbits: Diffuse mucoperiosteal thickening of paranasal sinuses with partial opacification of maxillary sinuses. The mastoid air cells are clear. Other: None CT CERVICAL SPINE FINDINGS Alignment: No acute subluxation. Skull base and vertebrae: No acute fracture. Soft tissues and spinal canal: No prevertebral fluid or swelling. No visible canal hematoma. Disc levels:  No acute findings.  Degenerative changes. Upper chest: Negative. Other: Bilateral carotid bulb calcified plaques. IMPRESSION: 1. No acute intracranial pathology. 2. 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 3. No acute/traumatic cervical spine pathology. 4. Paranasal sinus disease. Electronically Signed   By: Ceasar Mons.D.  On: 04/14/2023 20:04   CT CERVICAL SPINE WO CONTRAST  Result Date: 04/14/2023 CLINICAL DATA:  Delirium. EXAM: CT HEAD WITHOUT CONTRAST CT CERVICAL SPINE WITHOUT CONTRAST TECHNIQUE: Multidetector CT imaging of the head and cervical spine was performed following the standard protocol without intravenous contrast. Multiplanar CT image reconstructions of the cervical spine were also generated. RADIATION DOSE REDUCTION: This exam was performed according to the departmental dose-optimization program which includes automated exposure control, adjustment of the mA and/or kV according to patient size and/or use of iterative reconstruction technique. COMPARISON:  Head CT dated 11/08/2021. FINDINGS: CT HEAD FINDINGS Brain: The ventricles and sulci appropriate size for patient's age. Small area of infarct in the left cerebellar hemisphere, small left cerebellar old infarct. Subcentimeter right basal ganglia hypodense focus, likely a small old lacunar infarct. There is no acute intracranial hemorrhage. No mass effect or midline shift. No extra-axial fluid collection. Vascular: No hyperdense vessel or unexpected calcification. Skull: Normal. Negative for fracture or  focal lesion. Sinuses/Orbits: Diffuse mucoperiosteal thickening of paranasal sinuses with partial opacification of maxillary sinuses. The mastoid air cells are clear. Other: None CT CERVICAL SPINE FINDINGS Alignment: No acute subluxation. Skull base and vertebrae: No acute fracture. Soft tissues and spinal canal: No prevertebral fluid or swelling. No visible canal hematoma. Disc levels:  No acute findings.  Degenerative changes. Upper chest: Negative. Other: Bilateral carotid bulb calcified plaques. IMPRESSION: 1. No acute intracranial pathology. 2. 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 3. No acute/traumatic cervical spine pathology. 4. Paranasal sinus disease. Electronically Signed   By: Elgie Collard M.D.   On: 04/14/2023 20:04   DG Chest Portable 1 View  Result Date: 04/14/2023 CLINICAL DATA:  Chest pain. EXAM: PORTABLE CHEST 1 VIEW COMPARISON:  Chest x-ray 01/10/2015. FINDINGS: Low lung volumes. Streaky left basilar opacities. No visible pleural effusions or pneumothorax. No acute osseous abnormality. Cardiac silhouette is accentuated by technique and enlarged. IMPRESSION: Low lung volumes with streaky left basilar opacities, most likely atelectasis. Dedicated PA and lateral radiographs could better assess if clinically warranted. Electronically Signed   By: Feliberto Harts M.D.   On: 04/14/2023 18:23    EKG EKG Interpretation  Date/Time:  Wednesday May 05 2023 09:10:08 EDT Ventricular Rate:  88 PR Interval:  172 QRS Duration: 97 QT Interval:  407 QTC Calculation: 493 R Axis:   54 Text Interpretation: Sinus rhythm Borderline prolonged QT interval Non-specific ST-t changes Confirmed by Cathren Laine ([id number]) on 05/05/2023 10:58:45 AM  Radiology CT CHEST ABDOMEN PELVIS WO CONTRAST  Result Date: 05/05/2023 CLINICAL DATA:  Sepsis.  Increasing O2 requirements. EXAM: CT CHEST, ABDOMEN AND PELVIS WITHOUT CONTRAST TECHNIQUE: Multidetector CT imaging of the chest, abdomen and pelvis  was performed following the standard protocol without IV contrast. RADIATION DOSE REDUCTION: This exam was performed according to the departmental dose-optimization program which includes automated exposure control, adjustment of the mA and/or kV according to patient size and/or use of iterative reconstruction technique. COMPARISON:  Chest x-ray earlier 05/05/2023. Ultrasound abdomen 05/05/2023. CT scan 04/14/2023. FINDINGS: CT CHEST FINDINGS Cardiovascular: Heart is nonenlarged. Trace pericardial fluid or thickening. Coronary artery calcifications are seen. The thoracic aorta has a normal course and caliber with scattered vascular calcifications. Bovine type aortic arch. Significant calcified plaque of the left subclavian artery. Mediastinum/Nodes: Small hiatal hernia. Normal caliber thoracic esophagus. Heterogeneous small thyroid gland. No specific abnormal lymph node enlargement seen in the axillary region or hila on this noncontrast exam. There are some prominent less than 1 cm in size  in short axis mediastinal nodes, nonpathologic by size criteria. Example right of midline, precarinal measures 8 mm in short axis on series 2, image 23. These nodes are slightly more numerous than usually seen however. Lungs/Pleura: Developing small to moderate bilateral pleural effusions, right-greater-than-left. Adjacent parenchymal opacities in the lower lobes. Consolidation is possible. There are some patchy ground-glass and interstitial changes seen along the upper lung zones with thickening of the interstitial septa. Please correlate for a component of edema. No pneumothorax. Musculoskeletal: Mild degenerative changes along the spine. CT ABDOMEN PELVIS FINDINGS Hepatobiliary: Dilated gallbladder with some dependent stones. This was seen previously. Please correlate with separate ultrasound from 05/05/2023 earlier preserved hepatic parenchyma on this noncontrast examination otherwise. Pancreas: Moderate atrophy of the  pancreas.  No obvious mass. Spleen: Splenic granulomas. Adrenals/Urinary Tract: The adrenal glands are preserved. Mild bilateral renal atrophy with some perinephric stranding. Multiple calcifications along each kidney could be vascular rather than collecting system. No collecting system dilatation. No abnormal calcification seen within either ureter. Bladder is underdistended. There are low-lying pelvic structures. Please correlate for prolapse. Stomach/Bowel: On this non oral contrast exam, the large bowel has a normal course and caliber with scattered colonic stool. The stomach and small bowel are nondilated. Normal appendix in the right lower quadrant extending inferior to the cecum. Vascular/Lymphatic: Diffuse vascular calcifications identified particularly of branch vessels. Normal caliber aorta and IVC. No specific abnormal lymph node enlargement identified in the abdomen and pelvis. Reproductive: Status post hysterectomy. No adnexal masses. Other: Small focal wide mouth hernia seen along the anterior pelvic wall in the right side lateral to the margin of the right rectus abdominis muscle containing a loop of small bowel. No obstruction. This is unchanged from previous. Small fat containing umbilical hernia. Musculoskeletal: Scattered degenerative changes seen of the spine and pelvis. IMPRESSION: New bilateral pleural effusions, right-greater-than-left with the adjacent parenchymal opacities. Infiltrates are possible and recommend follow-up. There is also a component of interstitial septal thickening which is new. A component of edema is possible. No bowel obstruction, free air or free fluid with scattered stool. No obstructing renal stones.  Mild bilateral renal atrophy. Dilated gallbladder with a dependent stone. No adjacent inflammatory changes by noncontrast CT. Please correlate with the prior ultrasound. If there is further concern, HIDA scan may be of some benefit. Significant diffuse vascular  calcifications. 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 Please correlate with any particular symptoms. Electronically Signed   By: Karen Kays M.D.   On: 05/05/2023 14:58   US Abdomen Limited RUQ (LIVER/GB)  Result Date: 05/05/2023 CLINICAL DATA:  Right upper quadrant pain for several weeks. EXAM: ULTRASOUND ABDOMEN LIMITED RIGHT UPPER QUADRANT COMPARISON:  04/14/2023 FINDINGS: Gallbladder: Gallbladder appears distended, similar to CT from 04/14/2023. Multiple stones are identified measuring up to 8 mm. No gallbladder wall thickening, pericholecystic fluid, or sludge. Negative sonographic Murphy's sign (note: patient received pain medication prior to ultrasound). Common bile duct: Diameter: 5.6 mm Liver: No focal lesion identified. Mild increased parenchymal echogenicity. Portal vein is patent on color Doppler imaging with normal direction of blood flow towards the liver. Other: None. IMPRESSION: 1. Gallstones. No gallbladder wall thickening, pericholecystic fluid, or sludge. Negative sonographic Murphy's sign (note: patient received pain medication prior to ultrasound). 2. Mild increased hepatic parenchymal echogenicity suggestive of steatosis. Electronically Signed   By: Signa Kell M.D.   On: 05/05/2023 11:56   DG Chest Port 1 View  Result Date: 05/05/2023 CLINICAL DATA:  Generalized  chest pain, shortness of breath, and cough EXAM: PORTABLE CHEST 1 VIEW COMPARISON:  Chest radiograph dated 04/17/2023 FINDINGS: Low lung volumes with bronchovascular crowding. Bilateral perihilar interstitial opacities and bibasilar patchy opacities. Increased moderate bilateral pleural effusions. No pneumothorax. The heart size and mediastinal contours are within normal limits. No acute osseous abnormality. IMPRESSION: 1. Pulmonary edema with increased moderate bilateral pleural effusions. 2. Bibasilar patchy opacities, likely atelectasis. Aspiration or pneumonia can be  considered in the appropriate clinical setting. Electronically Signed   By: Agustin Cree M.D.   On: 05/05/2023 11:38    Procedures Procedures    Medications Ordered in ED Medications  cefTRIAXone (ROCEPHIN) 1 g in sodium chloride 0.9 % 100 mL IVPB (has no administration in time range)  azithromycin (ZITHROMAX) 500 mg in sodium chloride 0.9 % 250 mL IVPB (has no administration in time range)  lactated ringers bolus 1,000 mL (0 mLs Intravenous Stopped 05/05/23 1227)  ondansetron (ZOFRAN) injection 4 mg (4 mg Intravenous Given 05/05/23 1131)  HYDROmorphone (DILAUDID) injection 0.5 mg (0.5 mg Intravenous Given 05/05/23 1131)    ED Course/ Medical Decision Making/ A&P                             Medical Decision Making Problems Addressed: Elevated troponin: acute illness or injury Essential hypertension: chronic illness or injury with exacerbation, progression, or side effects of treatment that poses a threat to life or bodily functions Gallstones: chronic illness or injury with exacerbation, progression, or side effects of treatment that poses a threat to life or bodily functions Generalized weakness: acute illness or injury with systemic symptoms that poses a threat to life or bodily functions Hypoxia: acute illness or injury with systemic symptoms Non-productive cough: acute illness or injury Pleural effusion: acute illness or injury Pulmonary infiltrate: acute illness or injury Stage 4 chronic kidney disease (HCC): chronic illness or injury with exacerbation, progression, or side effects of treatment that poses a threat to life or bodily functions Upper abdominal pain: acute illness or injury  Amount and/or Complexity of Data Reviewed Independent Historian:     Details: Family, hx External Data Reviewed: labs, radiology and notes. Labs: ordered. Decision-making details documented in ED Course. Radiology: ordered and independent interpretation performed. Decision-making details documented  in ED Course. ECG/medicine tests: ordered and independent interpretation performed. Decision-making details documented in ED Course. Discussion of management or test interpretation with external provider(s): hospitalists  Risk Prescription drug management. Parenteral controlled substances. Decision regarding hospitalization.   Iv ns. Continuous pulse ox and cardiac monitoring. Labs ordered/sent. Imaging ordered.   Differential diagnosis includes  pyelo, cholecystitis, pancreatitis, etc. Dispo decision including potential need for admission considered - will get labs and imaging and reassess.   Reviewed nursing notes and prior charts for additional history. External reports reviewed. Additional history from: family.   Cardiac monitor: sinus rhythm, rate 90.  Labs reviewed/interpreted by me - wbc normal. Trop sl high but delta trop flat/not increasing - recheck pt, no chest pain or discomfort.   Xrays reviewed/interpreted by me - atelectasis vs infiltrate.   CT reviewed/interpreted by me - effusions, ?atelectasis vs pna. ?vascular congestion. Given increased cough, room air pulse ox 87%, will culture and treat for possible pna.   Pt also noted with gallstones and does c/o upper abd pain. No obvious cholecystitis on u/s and ct imaging. Wbc ok, lfts ok.   Will consult hospitalists for admission. Discussed pt - will admit  Additional labs  now resulted. Bnp is high/higher than previous, vascular congestion on imaging. Lasix dose iv.            Final Clinical Impression(s) / ED Diagnoses Final diagnoses:  None    Rx / DC Orders ED Discharge Orders     None          Cathren Laine, MD 05/05/23 1556

## 2023-05-05 NOTE — ED Notes (Signed)
Patient is on 4L O2 and is staying around 88% RN repositioned her and O2 increased to 90%  but continues to stop. She is also vomiting. MD aware

## 2023-05-05 NOTE — ED Notes (Addendum)
Noted O2 sat 82% good pleth. RN NOTIFIED. RN increased O2 to 4L. O2 sat 88-90%. Notifying ED MD.

## 2023-05-05 NOTE — Consult Note (Signed)
Reason for Consult: Renal failure Referring Physician:  Dr. Mauro Kaufmann  Chief Complaint: Abdominal and chest pain  Assessment and Plan: AKI on CKD 3b - b/l creat 1.5- 1.7 from nov 2022. Creat here prior admission from 4/15-4/21 ranged from 2.56-2.88 when she was admitted in setting of sepsis due to UTI/ pyelonephritis + ARB + nsaid's at home. Fluids were stopped during that admission with no improvement in renal function and worsening CXR findings. We were hopeful that there was an acute component and there may be a minor acute component of renal failure but she has certainly progressed since 10/2021 with likely CKD4. - Will check a renal ultrasound for a baseline size and check cortical thickness, not expecting to find obstruction. - Urine studies. - Lasix 40mg  IV given and will follow response; by history her weights should be down w/ anorexia and good UOP with the Lasix 40mg  for at least the 1st 7 days before she ran out. Repeat echo ordered which will be interesting as the last one was in 2022 and she has h/o of PAD and CVA. - May be helpful to attempt a thoracentesis if there is a significant effusion; right appears to be greater than the left.  -Maintain MAP>65 for optimal renal perfusion.  - Avoid nephrotoxic medications including NSAIDs and iodinated intravenous contrast exposure unless the latter is absolutely indicated.   - Preferred narcotic agents for pain control are hydromorphone, fentanyl, and methadone. Morphine should not be used.  - Avoid Baclofen and avoid oral sodium phosphate and magnesium citrate based laxatives / bowel preps.  - Continue strict Input and Output monitoring. Will monitor the patient closely with you and intervene or adjust therapy as indicated by changes in clinical status/labs   SOB / cough - being treated for PNA + diuresis. H/o recent Sepsis/ UTI - w/ cx growing out [facility]. Patient had been d/c with Keflex. DM2 - per pmd H/o CVA HTN - coreg and norvasc,  hydralazine on hold.  HPI: Samantha [name] is an 72 y.o. female HTN, DM, PAD, CVA, HLD, CKD3b/4 recently hospitalized with sepsis secondary to a E.Coli UTI/ pyelonephritis in the setting or ARB + NSAIDs and worsening renal function. She was initially given isotonic fluids with no improvement in renal function and nephrology was consulted during the admission as well. Creatinine had ranged from 1.5-1.7 in 10/2021 and then from 4/15-4/21 ranged from 2.56-2.88. With no improvement in renal function with fluid resuscitation + congestion on CXR + cough she was diuresed and actually sent out with Lasix. Her creatinine of 2.88 at time of discharge has actually improved to the 2.4 range at presentation. She took 7 days of Lasix 40mg  until she ran out but was feeling worse; she is not able to describe the exact feeling. She has had a poor appetite over the past week with chills but no fevers. She has had a cough productive of only white sputum. She has only taken ibuprofen once over the past week. She denies dysuria, hematuria, but has not had a bowel movement in days with no h/o diarrhea. She has had abdominal fullness as well.   ROS Pertinent items are noted in HPI.  Chemistry and CBC: Creat  Date/Time Value Ref Range Status  12/23/2016 09:40 AM 1.02 (H) 0.50 - 0.99 mg/dL Final    Comment:      For patients > or = 70 years of age: The upper reference limit for Creatinine is approximately 13% higher for people identified as African-American.  06/10/2016 11:14 AM 1.07 (H) 0.50 - 0.99 mg/dL Final    Comment:      For patients > or = 70 years of age: The upper reference limit for Creatinine is approximately 13% higher for people identified as African-American.     02/12/2016 05:07 PM 1.09 (H) 0.50 - 0.99 mg/dL Final  69/62/9528 41:32 AM 1.03 0.50 - 1.10 mg/dL Final   Creatinine, Ser  Date/Time Value Ref Range Status  05/05/2023 09:27 AM 2.34 (H) 0.44 - 1.00 mg/dL Final  44/12/270 53:66  AM 2.83 (H) 0.44 - 1.00 mg/dL Final  44/02/4741 59:56 AM 2.64 (H) 0.44 - 1.00 mg/dL Final  38/75/6433 29:51 AM 2.64 (H) 0.44 - 1.00 mg/dL Final  88/41/6606 30:16 AM 2.74 (H) 0.44 - 1.00 mg/dL Final  01/05/3234 57:32 PM 2.88 (H) 0.44 - 1.00 mg/dL Final  20/25/4270 62:37 PM 2.56 (H) 0.57 - 1.00 mg/dL Final  62/83/1517 61:60 PM 1.55 (H) 0.57 - 1.00 mg/dL Final  73/71/0626 94:85 AM 1.72 (H) 0.44 - 1.00 mg/dL Final  46/27/0350 09:38 AM 1.50 (H) 0.44 - 1.00 mg/dL Final  18/29/9371 69:67 AM 1.57 (H) 0.44 - 1.00 mg/dL Final  89/38/1017 51:02 AM 1.37 (H) 0.57 - 1.00 mg/dL Final  58/52/7782 42:35 AM 1.12 (H) 0.57 - 1.00 mg/dL Final  36/14/4315 40:08 AM 1.13 (H) 0.57 - 1.00 mg/dL Final  67/61/9509 32:67 PM 1.03 (H) 0.44 - 1.00 mg/dL Final  12/45/8099 83:38 AM 1.07 (H) 0.44 - 1.00 mg/dL Final  25/04/3975 73:41 AM 0.99 0.44 - 1.00 mg/dL Final  93/79/0240 97:35 AM 0.97 0.50 - 1.10 mg/dL Final  32/99/2426 83:41 PM 1.09 0.50 - 1.10 mg/dL Final  96/22/2979 89:21 AM 1.13 (H) 0.50 - 1.10 mg/dL Final   Recent Labs  Lab 05/05/23 0927  NA 133*  K 3.4*  CL 102  CO2 20*  GLUCOSE 201*  BUN 25*  CREATININE 2.34*  CALCIUM 8.7*   Recent Labs  Lab 05/05/23 0927  WBC 7.5  HGB 10.6*  HCT 31.5*  MCV 92.9  PLT 390   Liver Function Tests: Recent Labs  Lab 05/05/23 0927  AST 21  ALT 17  ALKPHOS 67  BILITOT 0.8  PROT 7.0  ALBUMIN 3.6   Recent Labs  Lab 05/05/23 0927  LIPASE 46   No results for input(s): "AMMONIA" in the last 168 hours. Cardiac Enzymes: No results for input(s): "CKTOTAL", "CKMB", "CKMBINDEX", "TROPONINI" in the last 168 hours. Iron Studies: No results for input(s): "IRON", "TIBC", "TRANSFERRIN", "FERRITIN" in the last 72 hours. PT/INR: @LABRCNTIP (inr:5)  Xrays/Other Studies: ) Results for orders placed or performed during the hospital encounter of 05/05/23 (from the past 48 hour(s))  Lipase, blood     Status: None   Collection Time: 05/05/23  9:27 AM  Result Value Ref  Range   Lipase 46 11 - 51 U/L    Comment: Performed at [facility], [address] [place], [place] [address]  Comprehensive metabolic panel     Status: Abnormal   Collection Time: 05/05/23  9:27 AM  Result Value Ref Range   Sodium 133 (L) 135 - 145 mmol/L   Potassium 3.4 (L) 3.5 - 5.1 mmol/L   Chloride 102 98 - 111 mmol/L   CO2 20 (L) 22 - 32 mmol/L   Glucose, Bld 201 (H) 70 - 99 mg/dL    Comment: Glucose reference range applies only to samples taken after fasting for at least 8 hours.   BUN 25 (H) 8 - 23 mg/dL  Creatinine, Ser 2.34 (H) 0.44 - 1.00 mg/dL   Calcium 8.7 (L) 8.9 - 10.3 mg/dL   Total Protein 7.0 6.5 - 8.1 g/dL   Albumin 3.6 3.5 - 5.0 g/dL   AST 21 15 - 41 U/L   ALT 17 0 - 44 U/L   Alkaline Phosphatase 67 38 - 126 U/L   Total Bilirubin 0.8 0.3 - 1.2 mg/dL   GFR, Estimated 22 (L) >60 mL/min    Comment: (NOTE) Calculated using the CKD-EPI Creatinine Equation (2021)    Anion gap 11 5 - 15    Comment: Performed at [facility], [address], [place], [place] [address]  CBC     Status: Abnormal   Collection Time: 05/05/23  9:27 AM  Result Value Ref Range   WBC 7.5 4.0 - 10.5 K/uL   RBC 3.39 (L) 3.87 - 5.11 MIL/uL   Hemoglobin 10.6 (L) 12.0 - 15.0 g/dL   HCT 60.4 (L) 54.0 - 98.1 %   MCV 92.9 80.0 - 100.0 fL   MCH 31.3 26.0 - 34.0 pg   MCHC 33.7 30.0 - 36.0 g/dL   RDW 19.1 47.8 - 29.5 %   Platelets 390 150 - 400 K/uL   nRBC 0.0 0.0 - 0.2 %    Comment: Performed at [facility], [address] [place], [place] [address]  Urinalysis, Routine w reflex microscopic -Urine, Clean Catch     Status: Abnormal   Collection Time: 05/05/23  9:27 AM  Result Value Ref Range   Color, Urine YELLOW YELLOW   APPearance CLEAR CLEAR   Specific Gravity, Urine 1.010 1.005 - 1.030   pH 6.0 5.0 - 8.0   Glucose, UA 50 (A) NEGATIVE mg/dL   Hgb urine dipstick NEGATIVE NEGATIVE   Bilirubin Urine NEGATIVE NEGATIVE   Ketones,  ur NEGATIVE NEGATIVE mg/dL   Protein, ur 865 (A) NEGATIVE mg/dL   Nitrite NEGATIVE NEGATIVE   Leukocytes,Ua NEGATIVE NEGATIVE   RBC / HPF 0-5 0 - 5 RBC/hpf   WBC, UA 0-5 0 - 5 WBC/hpf   Bacteria, UA NONE SEEN NONE SEEN   Squamous Epithelial / HPF 0-5 0 - 5 /HPF    Comment: Performed at [facility], [address] [place], [place] [address]  Troponin I (High Sensitivity)     Status: Abnormal   Collection Time: 05/05/23  9:27 AM  Result Value Ref Range   Troponin I (High Sensitivity) 51 (H) <18 ng/L    Comment: (NOTE) Elevated high sensitivity troponin I (hsTnI) values and significant  changes across serial measurements may suggest ACS but many other  chronic and acute conditions are known to elevate hsTnI results.  Refer to the "Links" section for chest pain algorithms and additional  guidance. Performed at [facility], [address] [place], [place] [address]   Brain natriuretic peptide     Status: Abnormal   Collection Time: 05/05/23  9:27 AM  Result Value Ref Range   B Natriuretic Peptide 601.4 (H) 0.0 - 100.0 pg/mL    Comment: Performed at [facility], [address] [place], [place] [address]  Troponin I (High Sensitivity)     Status: Abnormal   Collection Time: 05/05/23 11:22 AM  Result Value Ref Range   Troponin I (High Sensitivity) 49 (H) <18 ng/L    Comment: (NOTE) Elevated high sensitivity troponin I (hsTnI) values and significant  changes across serial measurements may suggest ACS but many other  chronic and acute conditions are known to elevate hsTnI results.  Refer to the "Links" section for chest pain algorithms and additional  guidance. Performed at [facility], [address] [place], [place] [address]   SARS Coronavirus 2 by RT PCR (hospital order, performed in [facility] hospital lab) *cepheid single result test* Anterior Nasal Swab     Status: None   Collection Time:  05/05/23 11:36 AM   Specimen: Anterior Nasal Swab  Result Value Ref Range   SARS Coronavirus 2 by RT PCR NEGATIVE NEGATIVE    Comment: (NOTE) SARS-CoV-2 target nucleic acids are NOT DETECTED.  The SARS-CoV-2 RNA is generally detectable in upper and lower respiratory specimens during the acute phase of infection. The lowest concentration of SARS-CoV-2 viral copies this assay can detect is 250 copies / mL. A negative result does not preclude SARS-CoV-2 infection and should not be used as the sole basis for treatment or other patient management decisions.  A negative result may occur with improper specimen collection / handling, submission of specimen other than nasopharyngeal swab, presence of viral mutation(s) within the areas targeted by this assay, and inadequate number of viral copies (<250 copies / mL). A negative result must be combined with clinical observations, patient history, and epidemiological information.  Fact Sheet for Patients:   RoadLapTop.co.za  Fact Sheet for Healthcare Providers: http://kim-miller.com/  This test is not yet approved or  cleared by the [place] FDA and has been authorized for detection and/or diagnosis of SARS-CoV-2 by FDA under an Emergency Use Authorization (EUA).  This EUA will remain in effect (meaning this test can be used) for the duration of the COVID-19 declaration under Section 564(b)(1) of the Act, 21 U.S.C. section 360bbb-3(b)(1), unless the authorization is terminated or revoked sooner.  Performed at [facility], [address] [place], [place] [address]    CT CHEST ABDOMEN PELVIS WO CONTRAST  Result Date: 05/05/2023 CLINICAL DATA:  Sepsis.  Increasing O2 requirements. EXAM: CT CHEST, ABDOMEN AND PELVIS WITHOUT CONTRAST TECHNIQUE: Multidetector CT imaging of the chest, abdomen and pelvis was performed following the standard protocol without IV contrast. RADIATION  DOSE REDUCTION: This exam was performed according to the departmental dose-optimization program which includes automated exposure control, adjustment of the mA and/or kV according to patient size and/or use of iterative reconstruction technique. COMPARISON:  Chest x-ray earlier 05/05/2023. Ultrasound abdomen 05/05/2023. CT scan 04/14/2023. FINDINGS: CT CHEST FINDINGS Cardiovascular: Heart is nonenlarged. Trace pericardial fluid or thickening. Coronary artery calcifications are seen. The thoracic aorta has a normal course and caliber with scattered vascular calcifications. Bovine type aortic arch. Significant calcified plaque of the left subclavian artery. Mediastinum/Nodes: Small hiatal hernia. Normal caliber thoracic esophagus. Heterogeneous small thyroid gland. No specific abnormal lymph node enlargement seen in the axillary region or hila on this noncontrast exam. There are some prominent less than 1 cm in size in short axis mediastinal nodes, nonpathologic by size criteria. Example right of midline, precarinal measures 8 mm in short axis on series 2, image 23. These nodes are slightly more numerous than usually seen however. Lungs/Pleura: Developing small to moderate bilateral pleural effusions, right-greater-than-left. Adjacent parenchymal opacities in the lower lobes. Consolidation is possible. There are some patchy ground-glass and interstitial changes seen along the upper lung zones with thickening of the interstitial septa. Please correlate for a component of edema. No pneumothorax. Musculoskeletal: Mild degenerative changes along the spine. CT ABDOMEN PELVIS FINDINGS Hepatobiliary: Dilated gallbladder with some dependent stones. This was  seen previously. Please correlate with separate ultrasound from 05/05/2023 earlier preserved hepatic parenchyma on this noncontrast examination otherwise. Pancreas: Moderate atrophy of the pancreas.  No obvious mass. Spleen: Splenic granulomas. Adrenals/Urinary Tract: The  adrenal glands are preserved. Mild bilateral renal atrophy with some perinephric stranding. Multiple calcifications along each kidney could be vascular rather than collecting system. No collecting system dilatation. No abnormal calcification seen within either ureter. Bladder is underdistended. There are low-lying pelvic structures. Please correlate for prolapse. Stomach/Bowel: On this non oral contrast exam, the large bowel has a normal course and caliber with scattered colonic stool. The stomach and small bowel are nondilated. Normal appendix in the right lower quadrant extending inferior to the cecum. Vascular/Lymphatic: Diffuse vascular calcifications identified particularly of branch vessels. Normal caliber aorta and IVC. No specific abnormal lymph node enlargement identified in the abdomen and pelvis. Reproductive: Status post hysterectomy. No adnexal masses. Other: Small focal wide mouth hernia seen along the anterior pelvic wall in the right side lateral to the margin of the right rectus abdominis muscle containing a loop of small bowel. No obstruction. This is unchanged from previous. Small fat containing umbilical hernia. Musculoskeletal: Scattered degenerative changes seen of the spine and pelvis. IMPRESSION: New bilateral pleural effusions, right-greater-than-left with the adjacent parenchymal opacities. Infiltrates are possible and recommend follow-up. There is also a component of interstitial septal thickening which is new. A component of edema is possible. No bowel obstruction, free air or free fluid with scattered stool. No obstructing renal stones.  Mild bilateral renal atrophy. Dilated gallbladder with a dependent stone. No adjacent inflammatory changes by noncontrast CT. Please correlate with the prior ultrasound. If there is further concern, HIDA scan may be of some benefit. Significant diffuse vascular calcifications. 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 Please correlate with any particular symptoms. Electronically Signed   By: Karen Kays M.D.   On: 05/05/2023 14:58   US Abdomen Limited RUQ (LIVER/GB)  Result Date: 05/05/2023 CLINICAL DATA:  Right upper quadrant pain for several weeks. EXAM: ULTRASOUND ABDOMEN LIMITED RIGHT UPPER QUADRANT COMPARISON:  04/14/2023 FINDINGS: Gallbladder: Gallbladder appears distended, similar to CT from 04/14/2023. Multiple stones are identified measuring up to 8 mm. No gallbladder wall thickening, pericholecystic fluid, or sludge. Negative sonographic Murphy's sign (note: patient received pain medication prior to ultrasound). Common bile duct: Diameter: 5.6 mm Liver: No focal lesion identified. Mild increased parenchymal echogenicity. Portal vein is patent on color Doppler imaging with normal direction of blood flow towards the liver. Other: None. IMPRESSION: 1. Gallstones. No gallbladder wall thickening, pericholecystic fluid, or sludge. Negative sonographic Murphy's sign (note: patient received pain medication prior to ultrasound). 2. Mild increased hepatic parenchymal echogenicity suggestive of steatosis. Electronically Signed   By: Signa Kell M.D.   On: 05/05/2023 11:56   DG Chest Port 1 View  Result Date: 05/05/2023 CLINICAL DATA:  Generalized chest pain, shortness of breath, and cough EXAM: PORTABLE CHEST 1 VIEW COMPARISON:  Chest radiograph dated 04/17/2023 FINDINGS: Low lung volumes with bronchovascular crowding. Bilateral perihilar interstitial opacities and bibasilar patchy opacities. Increased moderate bilateral pleural effusions. No pneumothorax. The heart size and mediastinal contours are within normal limits. No acute osseous abnormality. IMPRESSION: 1. Pulmonary edema with increased moderate bilateral pleural effusions. 2. Bibasilar patchy opacities, likely atelectasis. Aspiration or pneumonia can be considered in the appropriate clinical setting. Electronically Signed   By: Agustin Cree M.D.   On:  05/05/2023 11:38    PMH:   Past  Medical History:  Diagnosis Date   Acute pyelonephritis    Bacteremia, escherichia coli 01/12/2015   Chest pain    Critical lower limb ischemia (HCC)    Hyperlipidemia    Hypertension    Left carotid bruit    PAD (peripheral artery disease) (HCC)    Type II diabetes mellitus (HCC)     PSH:   Past Surgical History:  Procedure Laterality Date   ABDOMINAL ANGIOGRAM  05/16/2015   Procedure: Abdominal Angiogram;  Surgeon: Runell Gess, MD;  Location: [facility];  Service: Cardiovascular;;   ABDOMINAL HYSTERECTOMY  ~ 2000   PERIPHERAL VASCULAR CATHETERIZATION Bilateral 05/16/2015   Procedure: Lower Extremity Angiography;  Surgeon: Runell Gess, MD; renal arteries widely patent, L-SFA 75%, L-pop 95%, L-peroneal 90%; R-SFA 40%, R-pop 50%, R-prox peroneal 90%; directional atherectomy L-SFA, p-pop, drug-eluting balloon angioplasty reducing the stenoses to 0, small linear dissection was not flow limiting       PERIPHERAL VASCULAR CATHETERIZATION Left 05/16/2015   Procedure: Peripheral Vascular Atherectomy;  Surgeon: Runell Gess, MD;  Location: [facility];  Service: Cardiovascular;  Laterality: Left;  sfa    Allergies:  Allergies  Allergen Reactions   Perflutren Other (See Comments)    Pt with back pain as soon as administered.- patient refuted this in 2024    Medications:   Prior to Admission medications   Medication Sig Start Date End Date Taking? Authorizing Provider  amLODipine (NORVASC) 10 MG tablet Take 1 tablet (10 mg total) by mouth daily. 04/12/23   Hoy Register, MD  atorvastatin (LIPITOR) 80 MG tablet Take 1 tablet (80 mg total) by mouth daily. 04/12/23   Hoy Register, MD  carvedilol (COREG) 25 MG tablet Take 1 tablet (25 mg total) by mouth 2 (two) times daily with a meal. 04/12/23   Hoy Register, MD  clopidogrel (PLAVIX) 75 MG tablet Take 1 tablet (75 mg total) by mouth daily. 04/12/23   Hoy Register, MD   furosemide (LASIX) 40 MG tablet Take 1 tablet (40 mg total) by mouth daily for 7 days. 04/18/23 04/25/23  Burnadette Pop, MD  glimepiride (AMARYL) 4 MG tablet Take 1 tablet (4 mg total) by mouth daily with breakfast. 04/12/23   Hoy Register, MD  glucose blood test strip Use 3 times daily before meals 07/21/18   Hoy Register, MD  hydrALAZINE (APRESOLINE) 50 MG tablet Take 1 tablet (50 mg total) by mouth every 8 (eight) hours. 04/18/23   Burnadette Pop, MD  ibuprofen (ADVIL) 200 MG tablet Take 200 mg by mouth every 6 (six) hours as needed for mild pain or headache.    [provider]  insulin isophane & regular human KwikPen (HUMULIN 70/30 KWIKPEN) (70-30) 100 UNIT/ML KwikPen Inject 30 Units into the skin in the morning AND 25 Units every evening. Patient taking differently: Inject 30 Units into the skin in the morning AND 26 Units every evening. 04/12/23   Hoy Register, MD  Insulin Syringe-Needle U-100 (BD INSULIN SYRINGE ULTRAFINE) 31G X 15/64" 0.5 ML MISC Use subcutaneously twice daily 01/28/17   Hoy Register, MD  sodium bicarbonate 650 MG tablet Take 1 tablet (650 mg total) by mouth 3 (three) times daily. 04/18/23   Burnadette Pop, MD  TRUEPLUS LANCETS 28G MISC 1 each by Does not apply route 3 (three) times daily before meals. 12/23/16   Hoy Register, MD    Discontinued Meds:  There are no discontinued medications.  Social History:  reports that she has never smoked.  She has never used smokeless tobacco. She reports current alcohol use of about 1.0 - 2.0 standard drink of alcohol per week. She reports that she does not use drugs.  Family History:   Family History  Problem Relation Age of Onset   Hypertension Mother     Blood pressure (!) 165/81, pulse 81, temperature 98.3 F (36.8 C), temperature source Oral, resp. rate (!) 24, height 5\' 5"  (1.651 m), SpO2 96 %. Gen alert, no distress, sitting  in bed, on [place] No rash, cyanosis or gangrene Sclera anicteric, throat clear   No jvd or bruits, flat neck veins Chest poor air movement but no rales RRR no MRG Abd soft ntnd no mass or ascites  GU defer MS no joint effusions or deformity Ext no LE or UE edema, no wounds or ulcers Neuro is alert, Ox 3 , nf       Karan Inclan, Len Blalock, MD 05/05/2023, 4:38 PM

## 2023-05-05 NOTE — H&P (Addendum)
TRH H&P    Patient Demographics:    Samantha [name], is a 72 y.o. female  MRN: [id number]  DOB - Feb 11, 1951  Admit Date - 05/05/2023  Referring MD/NP/PA: Dr. Denton Lank  Outpatient Primary MD for the patient is Hoy Register, MD  Patient coming from: Home  Chief complaint-abdominal pain   HPI:    Samantha [name]  is a 72 y.o. female, with medical history of PAD, hypertension, diabetes mellitus type 2, insulin-dependent, history of CVA, depression, anxiety who was just discharged from the hospital on 4/21 after she was treated for UTI/pyelonephritis, pulmonary edema.  Patient was discharged on Lasix 40 mg p.o. daily for 7 days. The patient's daughter who was acting as interpreter, patient developed worsening abdominal discomfort yesterday and also developed chest discomfort.  She denies nausea vomiting or diarrhea.  Denies dysuria or fever.  But complains of chills. In the ED CT chest abdomen/pelvis was done which showed pulmonary edema versus bibasilar infiltrates.  BNP was elevated to 600.  Initially patient received IV fluid bolus LR 1 L.  Oxygen requirement went up to 4.5 L/min. 40 mg of IV Lasix ordered by ED provider. Lab work also showed mild elevation of troponin 49, 51, creatinine 2.34, improved from previous creatinine from 4/21 which was 2.83.    Review of systems:    In addition to the HPI above,    All other systems reviewed and are negative.    Past History of the following :    Past Medical History:  Diagnosis Date   Acute pyelonephritis    Bacteremia, escherichia coli 01/12/2015   Chest pain    Critical lower limb ischemia (HCC)    Hyperlipidemia    Hypertension    Left carotid bruit    PAD (peripheral artery disease) (HCC)    Type II diabetes mellitus (HCC)       Past Surgical History:  Procedure Laterality Date   ABDOMINAL ANGIOGRAM  05/16/2015   Procedure: Abdominal  Angiogram;  Surgeon: Runell Gess, MD;  Location: [facility];  Service: Cardiovascular;;   ABDOMINAL HYSTERECTOMY  ~ 2000   PERIPHERAL VASCULAR CATHETERIZATION Bilateral 05/16/2015   Procedure: Lower Extremity Angiography;  Surgeon: Runell Gess, MD; renal arteries widely patent, L-SFA 75%, L-pop 95%, L-peroneal 90%; R-SFA 40%, R-pop 50%, R-prox peroneal 90%; directional atherectomy L-SFA, p-pop, drug-eluting balloon angioplasty reducing the stenoses to 0, small linear dissection was not flow limiting       PERIPHERAL VASCULAR CATHETERIZATION Left 05/16/2015   Procedure: Peripheral Vascular Atherectomy;  Surgeon: Runell Gess, MD;  Location: [facility];  Service: Cardiovascular;  Laterality: Left;  sfa      Social History:      Social History   Tobacco Use   Smoking status: Never   Smokeless tobacco: Never  Substance Use Topics   Alcohol use: Yes    Alcohol/week: 1.0 - 2.0 standard drink of alcohol    Types: 1 - 2 Cans of beer per week    Comment: occas       Family History :  Family History  Problem Relation Age of Onset   Hypertension Mother       Home Medications:   Prior to Admission medications   Medication Sig Start Date End Date Taking? Authorizing Provider  amLODipine (NORVASC) 10 MG tablet Take 1 tablet (10 mg total) by mouth daily. 04/12/23   Hoy Register, MD  atorvastatin (LIPITOR) 80 MG tablet Take 1 tablet (80 mg total) by mouth daily. 04/12/23   Hoy Register, MD  carvedilol (COREG) 25 MG tablet Take 1 tablet (25 mg total) by mouth 2 (two) times daily with a meal. 04/12/23   Hoy Register, MD  clopidogrel (PLAVIX) 75 MG tablet Take 1 tablet (75 mg total) by mouth daily. 04/12/23   Hoy Register, MD  furosemide (LASIX) 40 MG tablet Take 1 tablet (40 mg total) by mouth daily for 7 days. 04/18/23 04/25/23  Burnadette Pop, MD  glimepiride (AMARYL) 4 MG tablet Take 1 tablet (4 mg total) by mouth daily with breakfast. 04/12/23    Hoy Register, MD  glucose blood test strip Use 3 times daily before meals 07/21/18   Hoy Register, MD  hydrALAZINE (APRESOLINE) 50 MG tablet Take 1 tablet (50 mg total) by mouth every 8 (eight) hours. 04/18/23   Burnadette Pop, MD  ibuprofen (ADVIL) 200 MG tablet Take 200 mg by mouth every 6 (six) hours as needed for mild pain or headache.    [provider]  insulin isophane & regular human KwikPen (HUMULIN 70/30 KWIKPEN) (70-30) 100 UNIT/ML KwikPen Inject 30 Units into the skin in the morning AND 25 Units every evening. Patient taking differently: Inject 30 Units into the skin in the morning AND 26 Units every evening. 04/12/23   Hoy Register, MD  Insulin Syringe-Needle U-100 (BD INSULIN SYRINGE ULTRAFINE) 31G X 15/64" 0.5 ML MISC Use subcutaneously twice daily 01/28/17   Hoy Register, MD  sodium bicarbonate 650 MG tablet Take 1 tablet (650 mg total) by mouth 3 (three) times daily. 04/18/23   Burnadette Pop, MD  TRUEPLUS LANCETS 28G MISC 1 each by Does not apply route 3 (three) times daily before meals. 12/23/16   Hoy Register, MD     Allergies:     Allergies  Allergen Reactions   Perflutren Other (See Comments)    Pt with back pain as soon as administered.- patient refuted this in 2024     Physical Exam:   Vitals  Blood pressure (!) 165/81, pulse 81, temperature 98.3 F (36.8 C), temperature source Oral, resp. rate (!) 24, height 5\' 5"  (1.651 m), SpO2 96 %.  1.  General: Appears in no acute distress  2. Psychiatric: Alert, oriented x 3, intact insight and judgment  3. Neurologic: Cranial nerves II through XII grossly intact, no focal deficit noted  4. HEENMT:  Atraumatic normocephalic, extraocular muscle intact  5. Respiratory : Bibasilar crackles auscultated  6. Cardiovascular : S1-S2, regular, no murmur auscultated, no edema in the lower extremities  7. Gastrointestinal:  Abdomen is soft, nontender, no organomegaly     Data Review:     CBC Recent Labs  Lab 05/05/23 0927  WBC 7.5  HGB 10.6*  HCT 31.5*  PLT 390  MCV 92.9  MCH 31.3  MCHC 33.7  RDW 12.6   ------------------------------------------------------------------------------------------------------------------  Results for orders placed or performed during the hospital encounter of 05/05/23 (from the past 48 hour(s))  Lipase, blood     Status: None   Collection Time: 05/05/23  9:27 AM  Result Value Ref Range   Lipase  46 11 - 51 U/L    Comment: Performed at [facility], [address] [place], [place] [address]  Comprehensive metabolic panel     Status: Abnormal   Collection Time: 05/05/23  9:27 AM  Result Value Ref Range   Sodium 133 (L) 135 - 145 mmol/L   Potassium 3.4 (L) 3.5 - 5.1 mmol/L   Chloride 102 98 - 111 mmol/L   CO2 20 (L) 22 - 32 mmol/L   Glucose, Bld 201 (H) 70 - 99 mg/dL    Comment: Glucose reference range applies only to samples taken after fasting for at least 8 hours.   BUN 25 (H) 8 - 23 mg/dL   Creatinine, Ser 6.04 (H) 0.44 - 1.00 mg/dL   Calcium 8.7 (L) 8.9 - 10.3 mg/dL   Total Protein 7.0 6.5 - 8.1 g/dL   Albumin 3.6 3.5 - 5.0 g/dL   AST 21 15 - 41 U/L   ALT 17 0 - 44 U/L   Alkaline Phosphatase 67 38 - 126 U/L   Total Bilirubin 0.8 0.3 - 1.2 mg/dL   GFR, Estimated 22 (L) >60 mL/min    Comment: (NOTE) Calculated using the CKD-EPI Creatinine Equation (2021)    Anion gap 11 5 - 15    Comment: Performed at [facility], [address], [place], [place] [address]  CBC     Status: Abnormal   Collection Time: 05/05/23  9:27 AM  Result Value Ref Range   WBC 7.5 4.0 - 10.5 K/uL   RBC 3.39 (L) 3.87 - 5.11 MIL/uL   Hemoglobin 10.6 (L) 12.0 - 15.0 g/dL   HCT 11.9 (L) 14.7 - 82.9 %   MCV 92.9 80.0 - 100.0 fL   MCH 31.3 26.0 - 34.0 pg   MCHC 33.7 30.0 - 36.0 g/dL   RDW 56.2 13.0 - 86.5 %   Platelets 390 150 - 400 K/uL   nRBC 0.0 0.0 - 0.2 %    Comment: Performed at [facility], [address] [place], [place] [address]  Urinalysis, Routine w reflex microscopic -Urine, Clean Catch     Status: Abnormal   Collection Time: 05/05/23  9:27 AM  Result Value Ref Range   Color, Urine YELLOW YELLOW   APPearance CLEAR CLEAR   Specific Gravity, Urine 1.010 1.005 - 1.030   pH 6.0 5.0 - 8.0   Glucose, UA 50 (A) NEGATIVE mg/dL   Hgb urine dipstick NEGATIVE NEGATIVE   Bilirubin Urine NEGATIVE NEGATIVE   Ketones, ur NEGATIVE NEGATIVE mg/dL   Protein, ur 629 (A) NEGATIVE mg/dL   Nitrite NEGATIVE NEGATIVE   Leukocytes,Ua NEGATIVE NEGATIVE   RBC / HPF 0-5 0 - 5 RBC/hpf   WBC, UA 0-5 0 - 5 WBC/hpf   Bacteria, UA NONE SEEN NONE SEEN   Squamous Epithelial / HPF 0-5 0 - 5 /HPF    Comment: Performed at [facility], [address], [place], [place] [address]  Troponin I (High Sensitivity)     Status: Abnormal   Collection Time: 05/05/23  9:27 AM  Result Value Ref Range   Troponin I (High Sensitivity) 51 (H) <18 ng/L    Comment: (NOTE) Elevated high sensitivity troponin I (hsTnI) values and significant  changes across serial measurements may suggest ACS but many other  chronic and acute conditions are known to elevate hsTnI results.  Refer to the "Links" section for chest pain algorithms and additional  guidance. Performed at [facility], [address], [place], [place] [address]   Brain natriuretic peptide     Status: Abnormal   Collection Time: 05/05/23  9:27 AM  Result Value Ref Range   B Natriuretic Peptide 601.4 (H) 0.0 - 100.0 pg/mL    Comment: Performed at [facility], [address], [place], [place] [address]  Troponin I (High Sensitivity)     Status: Abnormal   Collection Time: 05/05/23 11:22 AM  Result Value Ref Range   Troponin I (High Sensitivity) 49 (H) <18 ng/L    Comment: (NOTE) Elevated high sensitivity troponin I (hsTnI) values and significant  changes across serial measurements may  suggest ACS but many other  chronic and acute conditions are known to elevate hsTnI results.  Refer to the "Links" section for chest pain algorithms and additional  guidance. Performed at [facility], [address], [place], [place] [address]   SARS Coronavirus 2 by RT PCR (hospital order, performed in [facility] hospital lab) *cepheid single result test* Anterior Nasal Swab     Status: None   Collection Time: 05/05/23 11:36 AM   Specimen: Anterior Nasal Swab  Result Value Ref Range   SARS Coronavirus 2 by RT PCR NEGATIVE NEGATIVE    Comment: (NOTE) SARS-CoV-2 target nucleic acids are NOT DETECTED.  The SARS-CoV-2 RNA is generally detectable in upper and lower respiratory specimens during the acute phase of infection. The lowest concentration of SARS-CoV-2 viral copies this assay can detect is 250 copies / mL. A negative result does not preclude SARS-CoV-2 infection and should not be used as the sole basis for treatment or other patient management decisions.  A negative result may occur with improper specimen collection / handling, submission of specimen other than nasopharyngeal swab, presence of viral mutation(s) within the areas targeted by this assay, and inadequate number of viral copies (<250 copies / mL). A negative result must be combined with clinical observations, patient history, and epidemiological information.  Fact Sheet for Patients:   RoadLapTop.co.za  Fact Sheet for Healthcare Providers: http://kim-miller.com/  This test is not yet approved or  cleared by the [place] FDA and has been authorized for detection and/or diagnosis of SARS-CoV-2 by FDA under an Emergency Use Authorization (EUA).  This EUA will remain in effect (meaning this test can be used) for the duration of the COVID-19 declaration under Section 564(b)(1) of the Act, 21 U.S.C. section 360bbb-3(b)(1), unless the authorization is  terminated or revoked sooner.  Performed at [facility], [address] [place], [place] [address]     Chemistries  Recent Labs  Lab 05/05/23 [phone]  NA 133*  K 3.4*  CL 102  CO2 20*  GLUCOSE 201*  BUN 25*  CREATININE 2.34*  CALCIUM 8.7*  AST 21  ALT 17  ALKPHOS 67  BILITOT 0.8   ------------------------------------------------------------------------------------------------------------------  ------------------------------------------------------------------------------------------------------------------ GFR: CrCl cannot be calculated (Unknown ideal weight.). Liver Function Tests: Recent Labs  Lab 05/05/23 0927  AST 21  ALT 17  ALKPHOS 67  BILITOT 0.8  PROT 7.0  ALBUMIN 3.6   Recent Labs  Lab 05/05/23 0927  LIPASE 46    --------------------------------------------------------------------------------------------------------------- Urine analysis:    Component Value Date/Time   COLORURINE YELLOW 05/05/2023 0927   APPEARANCEUR CLEAR 05/05/2023 0927   LABSPEC 1.010 05/05/2023 0927   PHURINE 6.0 05/05/2023 0927   GLUCOSEU 50 (A) 05/05/2023 0927   HGBUR NEGATIVE 05/05/2023 0927   BILIRUBINUR NEGATIVE 05/05/2023 0927   BILIRUBINUR negative 08/18/2021  1207   BILIRUBINUR neg 06/28/2017 1508   KETONESUR NEGATIVE 05/05/2023 0927   PROTEINUR 100 (A) 05/05/2023 0927   UROBILINOGEN 0.2 08/18/2021 1207   UROBILINOGEN 0.2 01/15/2015 1032   NITRITE NEGATIVE 05/05/2023 0927   LEUKOCYTESUR NEGATIVE 05/05/2023 8119      Imaging Results:    CT CHEST ABDOMEN PELVIS WO CONTRAST  Result Date: 05/05/2023 CLINICAL DATA:  Sepsis.  Increasing O2 requirements. EXAM: CT CHEST, ABDOMEN AND PELVIS WITHOUT CONTRAST TECHNIQUE: Multidetector CT imaging of the chest, abdomen and pelvis was performed following the standard protocol without IV contrast. RADIATION DOSE REDUCTION: This exam was performed according to the departmental dose-optimization program  which includes automated exposure control, adjustment of the mA and/or kV according to patient size and/or use of iterative reconstruction technique. COMPARISON:  Chest x-ray earlier 05/05/2023. Ultrasound abdomen 05/05/2023. CT scan 04/14/2023. FINDINGS: CT CHEST FINDINGS Cardiovascular: Heart is nonenlarged. Trace pericardial fluid or thickening. Coronary artery calcifications are seen. The thoracic aorta has a normal course and caliber with scattered vascular calcifications. Bovine type aortic arch. Significant calcified plaque of the left subclavian artery. Mediastinum/Nodes: Small hiatal hernia. Normal caliber thoracic esophagus. Heterogeneous small thyroid gland. No specific abnormal lymph node enlargement seen in the axillary region or hila on this noncontrast exam. There are some prominent less than 1 cm in size in short axis mediastinal nodes, nonpathologic by size criteria. Example right of midline, precarinal measures 8 mm in short axis on series 2, image 23. These nodes are slightly more numerous than usually seen however. Lungs/Pleura: Developing small to moderate bilateral pleural effusions, right-greater-than-left. Adjacent parenchymal opacities in the lower lobes. Consolidation is possible. There are some patchy ground-glass and interstitial changes seen along the upper lung zones with thickening of the interstitial septa. Please correlate for a component of edema. No pneumothorax. Musculoskeletal: Mild degenerative changes along the spine. CT ABDOMEN PELVIS FINDINGS Hepatobiliary: Dilated gallbladder with some dependent stones. This was seen previously. Please correlate with separate ultrasound from 05/05/2023 earlier preserved hepatic parenchyma on this noncontrast examination otherwise. Pancreas: Moderate atrophy of the pancreas.  No obvious mass. Spleen: Splenic granulomas. Adrenals/Urinary Tract: The adrenal glands are preserved. Mild bilateral renal atrophy with some perinephric stranding.  Multiple calcifications along each kidney could be vascular rather than collecting system. No collecting system dilatation. No abnormal calcification seen within either ureter. Bladder is underdistended. There are low-lying pelvic structures. Please correlate for prolapse. Stomach/Bowel: On this non oral contrast exam, the large bowel has a normal course and caliber with scattered colonic stool. The stomach and small bowel are nondilated. Normal appendix in the right lower quadrant extending inferior to the cecum. Vascular/Lymphatic: Diffuse vascular calcifications identified particularly of branch vessels. Normal caliber aorta and IVC. No specific abnormal lymph node enlargement identified in the abdomen and pelvis. Reproductive: Status post hysterectomy. No adnexal masses. Other: Small focal wide mouth hernia seen along the anterior pelvic wall in the right side lateral to the margin of the right rectus abdominis muscle containing a loop of small bowel. No obstruction. This is unchanged from previous. Small fat containing umbilical hernia. Musculoskeletal: Scattered degenerative changes seen of the spine and pelvis. IMPRESSION: New bilateral pleural effusions, right-greater-than-left with the adjacent parenchymal opacities. Infiltrates are possible and recommend follow-up. There is also a component of interstitial septal thickening which is new. A component of edema is possible. No bowel obstruction, free air or free fluid with scattered stool. No obstructing renal stones.  Mild bilateral renal atrophy. Dilated gallbladder with a dependent stone.  No adjacent inflammatory changes by noncontrast CT. Please correlate with the prior ultrasound. If there is further concern, HIDA scan may be of some benefit. Significant diffuse vascular calcifications. 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 Please correlate with any particular symptoms. Electronically Signed   By: Karen Kays M.D.   On: 05/05/2023 14:58   US Abdomen Limited RUQ (LIVER/GB)  Result Date: 05/05/2023 CLINICAL DATA:  Right upper quadrant pain for several weeks. EXAM: ULTRASOUND ABDOMEN LIMITED RIGHT UPPER QUADRANT COMPARISON:  04/14/2023 FINDINGS: Gallbladder: Gallbladder appears distended, similar to CT from 04/14/2023. Multiple stones are identified measuring up to 8 mm. No gallbladder wall thickening, pericholecystic fluid, or sludge. Negative sonographic Murphy's sign (note: patient received pain medication prior to ultrasound). Common bile duct: Diameter: 5.6 mm Liver: No focal lesion identified. Mild increased parenchymal echogenicity. Portal vein is patent on color Doppler imaging with normal direction of blood flow towards the liver. Other: None. IMPRESSION: 1. Gallstones. No gallbladder wall thickening, pericholecystic fluid, or sludge. Negative sonographic Murphy's sign (note: patient received pain medication prior to ultrasound). 2. Mild increased hepatic parenchymal echogenicity suggestive of steatosis. Electronically Signed   By: Signa Kell M.D.   On: 05/05/2023 11:56   DG Chest Port 1 View  Result Date: 05/05/2023 CLINICAL DATA:  Generalized chest pain, shortness of breath, and cough EXAM: PORTABLE CHEST 1 VIEW COMPARISON:  Chest radiograph dated 04/17/2023 FINDINGS: Low lung volumes with bronchovascular crowding. Bilateral perihilar interstitial opacities and bibasilar patchy opacities. Increased moderate bilateral pleural effusions. No pneumothorax. The heart size and mediastinal contours are within normal limits. No acute osseous abnormality. IMPRESSION: 1. Pulmonary edema with increased moderate bilateral pleural effusions. 2. Bibasilar patchy opacities, likely atelectasis. Aspiration or pneumonia can be considered in the appropriate clinical setting. Electronically Signed   By: Agustin Cree M.D.   On: 05/05/2023 11:38    My personal review of EKG: Rhythm NSR, nonspecific ST changes    Assessment & Plan:    Principal Problem:   Acute hypoxemic respiratory failure (HCC)   Acute hypoxemic respiratory failure-insetting of pulmonary edema, bilateral pleural effusions, BNP elevated to 601, currently requiring 4.5 L of oxygen via nasal cannula, initially given 1 L of LR for possible pneumonia.  Now Lasix 40 mg IV ordered.  Will monitor on telemetry. Pulmonary edema-likely from underlying diastolic heart failure, last echo from 2022 showed EF 65% with grade 1 diastolic dysfunction.  Will repeat echocardiogram.  She will require aggressive diuresis with Lasix.  Due to underlying renal dysfunction, I have consulted nephrology to help with dosing of Lasix.  Patient has mild elevation of troponin, likely demand ischemia from pulmonary edema.  Troponin is downtrending 51, 49.  Will obtain echocardiogram. CKD stage IV-creatinine 2.34 today, was 2.83 on 04/18/2023.  Started on Lasix as above.  Nephrology consulted for help with diuresis. ?  Pneumonia-chest x-ray showed bibasilar patchy opacities, there was a concern for pneumonia so she was given 1 dose of Rocephin Zithromax.  At this time, I do not think the patient has pneumonia.  Will not continue with antibiotics.  Patient's symptoms are due to pulmonary edema as above.  She is afebrile, normal WBC, vitals are stable. Abdominal pain-patient complained of abdominal pain, ultrasound abdomen showed gallstones, no gallbladder wall thickening, pericholecystic fluid or sludge.  Negative sonographic Murphy sign.  Shows mild increase hepatic parenchymal echogenicity, suggestive of steatosis.  Earlier received Dilaudid.  Pain has improved.  Continue Zofran for as needed nausea  and vomiting. Diabetes mellitus type 2-hold glimepiride.  Continue insulin 70/30, 20 units subcu twice daily with meals.  Will initiate sliding scale insulin with NovoLog. History of CAD-continue Plavix. Hypertension-patient takes amlodipine, Coreg,  Will hold amlodipine, continue  with Coreg.    DVT Prophylaxis-   heparin  AM Labs Ordered, also please review Full Orders  Family Communication: Admission, patients condition and plan of care including tests being ordered have been discussed with the patient  who indicate understanding and agree with the plan and Code Status.  Code Status: Full code  Admission status:Inpatient- status is judged to be reasonable and necessary in order to provide the required intensity of service to ensure the patient's safety. The patient's presenting symptoms, physical exam findings, and initial radiographic and laboratory data in the context of their chronic comorbidities is felt to place them at high risk for further clinical deterioration. Furthermore, it is not anticipated that the patient will be medically stable for discharge from the hospital within 2 midnights of admission. The following factors support the admission status of inpatient.    Time spent in minutes : 60 min   Parsa Rickett S Safaa Stingley M.D

## 2023-05-05 NOTE — ED Notes (Signed)
RN noticed patient O2 is 89% RA RN attempted to place O2 on patient. Patient refused stating she feels fine. RN explained to patient and daughter that if O2 continues to drop or gets to 86% O2 will be needed clear understanding voiced by patient and patient daughter.

## 2023-05-05 NOTE — ED Notes (Addendum)
Pt currently vomiting. RN aware.

## 2023-05-05 NOTE — ED Notes (Signed)
Pt sob when moving up in the bed. O2 increased to 4.5L due to O2 of 85%

## 2023-05-05 NOTE — ED Notes (Addendum)
After receiving dilaudid patient O2 decreased RN placed patient on 2L O2 via [place].

## 2023-05-05 NOTE — ED Notes (Signed)
ED TO INPATIENT HANDOFF REPORT  ED Nurse Name and Phone #: Thamas Jaegers Name/Age/Gender Samantha [name] 72 y.o. female Room/Bed: WA04/WA04  Code Status   Code Status: Full Code  Home/SNF/Other Home Patient oriented to: self, place, time, and situation Is this baseline? Yes   Triage Complete: Triage complete  Chief Complaint Acute hypoxemic respiratory failure (HCC) [J96.01]  Triage Note Pt arrived via POV. Pt c/o generalized abd and chest pain for several weeks. Pt went 8 days w/o BM, but had one last night. Pain is causing SOB.  AOx4   Allergies Allergies  Allergen Reactions   Perflutren Other (See Comments)    Pt with back pain as soon as administered.- patient refuted this in 2024    Level of Care/Admitting Diagnosis ED Disposition     ED Disposition  Admit   Condition  --   Comment  Hospital Area: [facility] [[id number]]  Level of Care: Telemetry [5]  Admit to tele based on following criteria: Acute CHF  May admit patient to [facility] or [facility] if equivalent level of care is available:: No  Covid Evaluation: Asymptomatic - no recent exposure (last 10 days) testing not required  Diagnosis: Acute hypoxemic respiratory failure [facility]) [[id number]]  Admitting Physician: Meredeth Ide [[id number]]  Attending Physician: Meredeth Ide [[id number]]  Certification:: I certify this patient will need inpatient services for at least 2 midnights  Estimated Length of Stay: 3          B Medical/Surgery History Past Medical History:  Diagnosis Date   Acute pyelonephritis    Bacteremia, escherichia coli 01/12/2015   Chest pain    Critical lower limb ischemia (HCC)    Hyperlipidemia    Hypertension    Left carotid bruit    PAD (peripheral artery disease) (HCC)    Type II diabetes mellitus (HCC)    Past Surgical History:  Procedure Laterality Date   ABDOMINAL ANGIOGRAM  05/16/2015   Procedure: Abdominal Angiogram;  Surgeon: Runell Gess, MD;   Location: [facility] INVASIVE CV LAB;  Service: Cardiovascular;;   ABDOMINAL HYSTERECTOMY  ~ 2000   PERIPHERAL VASCULAR CATHETERIZATION Bilateral 05/16/2015   Procedure: Lower Extremity Angiography;  Surgeon: Runell Gess, MD; renal arteries widely patent, L-SFA 75%, L-pop 95%, L-peroneal 90%; R-SFA 40%, R-pop 50%, R-prox peroneal 90%; directional atherectomy L-SFA, p-pop, drug-eluting balloon angioplasty reducing the stenoses to 0, small linear dissection was not flow limiting       PERIPHERAL VASCULAR CATHETERIZATION Left 05/16/2015   Procedure: Peripheral Vascular Atherectomy;  Surgeon: Runell Gess, MD;  Location: [facility] INVASIVE CV LAB;  Service: Cardiovascular;  Laterality: Left;  sfa     A IV Location/Drains/Wounds Patient Lines/Drains/Airways Status     Active Line/Drains/Airways     Name Placement date Placement time Site Days   Peripheral IV 11/08/21 18 G Right Antecubital 11/08/21  --  Antecubital  543   Peripheral IV 05/05/23 20 G 1" Right Antecubital 05/05/23  1122  Antecubital  less than 1   Wound / Incision (Open or Dehisced) 01/15/15 Diabetic ulcer Foot Left DM foot ulcer to 4th toe on left foot 01/15/15  1800  Foot  3032            Intake/Output Last 24 hours  Intake/Output Summary (Last 24 hours) at 05/05/2023 1635 Last data filed at 05/05/2023 1227 Gross per 24 hour  Intake 899.1 ml  Output --  Net 899.1 ml    Labs/Imaging Results for orders  placed or performed during the hospital encounter of 05/05/23 (from the past 48 hour(s))  Lipase, blood     Status: None   Collection Time: 05/05/23  9:27 AM  Result Value Ref Range   Lipase 46 11 - 51 U/L    Comment: Performed at [facility], [address] [place], [place] [address]  Comprehensive metabolic panel     Status: Abnormal   Collection Time: 05/05/23  9:27 AM  Result Value Ref Range   Sodium 133 (L) 135 - 145 mmol/L   Potassium 3.4 (L) 3.5 - 5.1 mmol/L   Chloride 102 98 - 111 mmol/L   CO2  20 (L) 22 - 32 mmol/L   Glucose, Bld 201 (H) 70 - 99 mg/dL    Comment: Glucose reference range applies only to samples taken after fasting for at least 8 hours.   BUN 25 (H) 8 - 23 mg/dL   Creatinine, Ser 1.91 (H) 0.44 - 1.00 mg/dL   Calcium 8.7 (L) 8.9 - 10.3 mg/dL   Total Protein 7.0 6.5 - 8.1 g/dL   Albumin 3.6 3.5 - 5.0 g/dL   AST 21 15 - 41 U/L   ALT 17 0 - 44 U/L   Alkaline Phosphatase 67 38 - 126 U/L   Total Bilirubin 0.8 0.3 - 1.2 mg/dL   GFR, Estimated 22 (L) >60 mL/min    Comment: (NOTE) Calculated using the CKD-EPI Creatinine Equation (2021)    Anion gap 11 5 - 15    Comment: Performed at [facility], [address], [place], [place] [address]  CBC     Status: Abnormal   Collection Time: 05/05/23  9:27 AM  Result Value Ref Range   WBC 7.5 4.0 - 10.5 K/uL   RBC 3.39 (L) 3.87 - 5.11 MIL/uL   Hemoglobin 10.6 (L) 12.0 - 15.0 g/dL   HCT 56.2 (L) 13.0 - 86.5 %   MCV 92.9 80.0 - 100.0 fL   MCH 31.3 26.0 - 34.0 pg   MCHC 33.7 30.0 - 36.0 g/dL   RDW 78.4 69.6 - 29.5 %   Platelets 390 150 - 400 K/uL   nRBC 0.0 0.0 - 0.2 %    Comment: Performed at [facility], [address] [place], [place] [address]  Urinalysis, Routine w reflex microscopic -Urine, Clean Catch     Status: Abnormal   Collection Time: 05/05/23  9:27 AM  Result Value Ref Range   Color, Urine YELLOW YELLOW   APPearance CLEAR CLEAR   Specific Gravity, Urine 1.010 1.005 - 1.030   pH 6.0 5.0 - 8.0   Glucose, UA 50 (A) NEGATIVE mg/dL   Hgb urine dipstick NEGATIVE NEGATIVE   Bilirubin Urine NEGATIVE NEGATIVE   Ketones, ur NEGATIVE NEGATIVE mg/dL   Protein, ur 244 (A) NEGATIVE mg/dL   Nitrite NEGATIVE NEGATIVE   Leukocytes,Ua NEGATIVE NEGATIVE   RBC / HPF 0-5 0 - 5 RBC/hpf   WBC, UA 0-5 0 - 5 WBC/hpf   Bacteria, UA NONE SEEN NONE SEEN   Squamous Epithelial / HPF 0-5 0 - 5 /HPF    Comment: Performed at [facility], [address] [place],  [place] [address]  Troponin I (High Sensitivity)     Status: Abnormal   Collection Time: 05/05/23  9:27 AM  Result Value Ref Range   Troponin I (High Sensitivity) 51 (H) <18 ng/L    Comment: (NOTE) Elevated high sensitivity troponin I (hsTnI) values and significant  changes across serial measurements may suggest ACS but many other  chronic and acute conditions are known to elevate hsTnI results.  Refer to the "Links" section for chest pain algorithms and additional  guidance. Performed at [facility], [address], [place], [place] [address]   Brain natriuretic peptide     Status: Abnormal   Collection Time: 05/05/23  9:27 AM  Result Value Ref Range   B Natriuretic Peptide 601.4 (H) 0.0 - 100.0 pg/mL    Comment: Performed at [facility], [address], [place], [place] [address]  Troponin I (High Sensitivity)     Status: Abnormal   Collection Time: 05/05/23 11:22 AM  Result Value Ref Range   Troponin I (High Sensitivity) 49 (H) <18 ng/L    Comment: (NOTE) Elevated high sensitivity troponin I (hsTnI) values and significant  changes across serial measurements may suggest ACS but many other  chronic and acute conditions are known to elevate hsTnI results.  Refer to the "Links" section for chest pain algorithms and additional  guidance. Performed at [facility], [address] [place], [place] [address]   SARS Coronavirus 2 by RT PCR (hospital order, performed in [facility] hospital lab) *cepheid single result test* Anterior Nasal Swab     Status: None   Collection Time: 05/05/23 11:36 AM   Specimen: Anterior Nasal Swab  Result Value Ref Range   SARS Coronavirus 2 by RT PCR NEGATIVE NEGATIVE    Comment: (NOTE) SARS-CoV-2 target nucleic acids are NOT DETECTED.  The SARS-CoV-2 RNA is generally detectable in upper and lower respiratory specimens during the acute phase of infection. The lowest concentration of SARS-CoV-2 viral  copies this assay can detect is 250 copies / mL. A negative result does not preclude SARS-CoV-2 infection and should not be used as the sole basis for treatment or other patient management decisions.  A negative result may occur with improper specimen collection / handling, submission of specimen other than nasopharyngeal swab, presence of viral mutation(s) within the areas targeted by this assay, and inadequate number of viral copies (<250 copies / mL). A negative result must be combined with clinical observations, patient history, and epidemiological information.  Fact Sheet for Patients:   RoadLapTop.co.za  Fact Sheet for Healthcare Providers: http://kim-miller.com/  This test is not yet approved or  cleared by the [place] FDA and has been authorized for detection and/or diagnosis of SARS-CoV-2 by FDA under an Emergency Use Authorization (EUA).  This EUA will remain in effect (meaning this test can be used) for the duration of the COVID-19 declaration under Section 564(b)(1) of the Act, 21 U.S.C. section 360bbb-3(b)(1), unless the authorization is terminated or revoked sooner.  Performed at [facility], [address] [place], [place] [address]    CT CHEST ABDOMEN PELVIS WO CONTRAST  Result Date: 05/05/2023 CLINICAL DATA:  Sepsis.  Increasing O2 requirements. EXAM: CT CHEST, ABDOMEN AND PELVIS WITHOUT CONTRAST TECHNIQUE: Multidetector CT imaging of the chest, abdomen and pelvis was performed following the standard protocol without IV contrast. RADIATION DOSE REDUCTION: This exam was performed according to the departmental dose-optimization program which includes automated exposure control, adjustment of the mA and/or kV according to patient size and/or use of iterative reconstruction technique. COMPARISON:  Chest x-ray earlier 05/05/2023. Ultrasound abdomen 05/05/2023. CT scan 04/14/2023. FINDINGS: CT CHEST  FINDINGS Cardiovascular: Heart is nonenlarged. Trace pericardial fluid or thickening. Coronary artery calcifications are seen. The thoracic aorta has a normal course and  caliber with scattered vascular calcifications. Bovine type aortic arch. Significant calcified plaque of the left subclavian artery. Mediastinum/Nodes: Small hiatal hernia. Normal caliber thoracic esophagus. Heterogeneous small thyroid gland. No specific abnormal lymph node enlargement seen in the axillary region or hila on this noncontrast exam. There are some prominent less than 1 cm in size in short axis mediastinal nodes, nonpathologic by size criteria. Example right of midline, precarinal measures 8 mm in short axis on series 2, image 23. These nodes are slightly more numerous than usually seen however. Lungs/Pleura: Developing small to moderate bilateral pleural effusions, right-greater-than-left. Adjacent parenchymal opacities in the lower lobes. Consolidation is possible. There are some patchy ground-glass and interstitial changes seen along the upper lung zones with thickening of the interstitial septa. Please correlate for a component of edema. No pneumothorax. Musculoskeletal: Mild degenerative changes along the spine. CT ABDOMEN PELVIS FINDINGS Hepatobiliary: Dilated gallbladder with some dependent stones. This was seen previously. Please correlate with separate ultrasound from 05/05/2023 earlier preserved hepatic parenchyma on this noncontrast examination otherwise. Pancreas: Moderate atrophy of the pancreas.  No obvious mass. Spleen: Splenic granulomas. Adrenals/Urinary Tract: The adrenal glands are preserved. Mild bilateral renal atrophy with some perinephric stranding. Multiple calcifications along each kidney could be vascular rather than collecting system. No collecting system dilatation. No abnormal calcification seen within either ureter. Bladder is underdistended. There are low-lying pelvic structures. Please correlate for  prolapse. Stomach/Bowel: On this non oral contrast exam, the large bowel has a normal course and caliber with scattered colonic stool. The stomach and small bowel are nondilated. Normal appendix in the right lower quadrant extending inferior to the cecum. Vascular/Lymphatic: Diffuse vascular calcifications identified particularly of branch vessels. Normal caliber aorta and IVC. No specific abnormal lymph node enlargement identified in the abdomen and pelvis. Reproductive: Status post hysterectomy. No adnexal masses. Other: Small focal wide mouth hernia seen along the anterior pelvic wall in the right side lateral to the margin of the right rectus abdominis muscle containing a loop of small bowel. No obstruction. This is unchanged from previous. Small fat containing umbilical hernia. Musculoskeletal: Scattered degenerative changes seen of the spine and pelvis. IMPRESSION: New bilateral pleural effusions, right-greater-than-left with the adjacent parenchymal opacities. Infiltrates are possible and recommend follow-up. There is also a component of interstitial septal thickening which is new. A component of edema is possible. No bowel obstruction, free air or free fluid with scattered stool. No obstructing renal stones.  Mild bilateral renal atrophy. Dilated gallbladder with a dependent stone. No adjacent inflammatory changes by noncontrast CT. Please correlate with the prior ultrasound. If there is further concern, HIDA scan may be of some benefit. Significant diffuse vascular calcifications. 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 Please correlate with any particular symptoms. Electronically Signed   By: Karen Kays M.D.   On: 05/05/2023 14:58   US Abdomen Limited RUQ (LIVER/GB)  Result Date: 05/05/2023 CLINICAL DATA:  Right upper quadrant pain for several weeks. EXAM: ULTRASOUND ABDOMEN LIMITED RIGHT UPPER QUADRANT COMPARISON:  04/14/2023 FINDINGS: Gallbladder:  Gallbladder appears distended, similar to CT from 04/14/2023. Multiple stones are identified measuring up to 8 mm. No gallbladder wall thickening, pericholecystic fluid, or sludge. Negative sonographic Murphy's sign (note: patient received pain medication prior to ultrasound). Common bile duct: Diameter: 5.6 mm Liver: No focal lesion identified. Mild increased parenchymal echogenicity. Portal vein is patent on color Doppler imaging with normal direction of blood flow towards the liver. Other: None. IMPRESSION: 1. Gallstones. No gallbladder  wall thickening, pericholecystic fluid, or sludge. Negative sonographic Murphy's sign (note: patient received pain medication prior to ultrasound). 2. Mild increased hepatic parenchymal echogenicity suggestive of steatosis. Electronically Signed   By: Signa Kell M.D.   On: 05/05/2023 11:56   DG Chest Port 1 View  Result Date: 05/05/2023 CLINICAL DATA:  Generalized chest pain, shortness of breath, and cough EXAM: PORTABLE CHEST 1 VIEW COMPARISON:  Chest radiograph dated 04/17/2023 FINDINGS: Low lung volumes with bronchovascular crowding. Bilateral perihilar interstitial opacities and bibasilar patchy opacities. Increased moderate bilateral pleural effusions. No pneumothorax. The heart size and mediastinal contours are within normal limits. No acute osseous abnormality. IMPRESSION: 1. Pulmonary edema with increased moderate bilateral pleural effusions. 2. Bibasilar patchy opacities, likely atelectasis. Aspiration or pneumonia can be considered in the appropriate clinical setting. Electronically Signed   By: Agustin Cree M.D.   On: 05/05/2023 11:38    Pending Labs Unresulted Labs (From admission, onward)     Start     Ordered   05/05/23 1511  Blood culture (routine x 2)  BLOOD CULTURE X 2,   R     Question:  Patient immune status  Answer:  Normal   05/05/23 1510   Signed and Held  CBC  (heparin)  Once,   R       Comments: Baseline for heparin therapy IF NOT ALREADY  DRAWN.  Notify MD if PLT < 100 K.    Signed and Held   Signed and Held  Creatinine, serum  (heparin)  Once,   R       Comments: Baseline for heparin therapy IF NOT ALREADY DRAWN.    Signed and Held   Signed and Held  CBC  Tomorrow morning,   R        Signed and Held   Signed and Held  Comprehensive metabolic panel  Tomorrow morning,   R        Signed and Held            Vitals/Pain Today's Vitals   05/05/23 1558 05/05/23 1559 05/05/23 1600 05/05/23 1615  BP:   (!) 157/75 (!) 165/81  Pulse: 90 90 86 81  Resp: 15 17 20  (!) 24  Temp:      TempSrc:      SpO2: (!) 87% 90% 92% 96%  Height:      PainSc:        Isolation Precautions No active isolations  Medications Medications  azithromycin (ZITHROMAX) 500 mg in sodium chloride 0.9 % 250 mL IVPB (has no administration in time range)  lactated ringers bolus 1,000 mL (0 mLs Intravenous Stopped 05/05/23 1227)  ondansetron (ZOFRAN) injection 4 mg (4 mg Intravenous Given 05/05/23 1131)  HYDROmorphone (DILAUDID) injection 0.5 mg (0.5 mg Intravenous Given 05/05/23 1131)  cefTRIAXone (ROCEPHIN) 1 g in sodium chloride 0.9 % 100 mL IVPB (1 g Intravenous New Bag/Given 05/05/23 1602)  furosemide (LASIX) injection 40 mg (40 mg Intravenous Given 05/05/23 1606)    Mobility walks with device     Focused Assessments     R Recommendations: See Admitting Provider Note  Report given to:   Additional Notes:

## 2023-05-06 ENCOUNTER — Inpatient Hospital Stay (HOSPITAL_COMMUNITY): Payer: Medicaid Other

## 2023-05-06 DIAGNOSIS — J9601 Acute respiratory failure with hypoxia: Secondary | ICD-10-CM

## 2023-05-06 DIAGNOSIS — K8020 Calculus of gallbladder without cholecystitis without obstruction: Secondary | ICD-10-CM

## 2023-05-06 DIAGNOSIS — I5031 Acute diastolic (congestive) heart failure: Secondary | ICD-10-CM

## 2023-05-06 DIAGNOSIS — R7989 Other specified abnormal findings of blood chemistry: Secondary | ICD-10-CM

## 2023-05-06 DIAGNOSIS — R531 Weakness: Secondary | ICD-10-CM

## 2023-05-06 DIAGNOSIS — I10 Essential (primary) hypertension: Secondary | ICD-10-CM

## 2023-05-06 DIAGNOSIS — I5043 Acute on chronic combined systolic (congestive) and diastolic (congestive) heart failure: Secondary | ICD-10-CM

## 2023-05-06 LAB — COMPREHENSIVE METABOLIC PANEL
ALT: 17 U/L (ref 0–44)
AST: 21 U/L (ref 15–41)
Albumin: 3.3 g/dL — ABNORMAL LOW (ref 3.5–5.0)
Alkaline Phosphatase: 61 U/L (ref 38–126)
Anion gap: 9 (ref 5–15)
BUN: 29 mg/dL — ABNORMAL HIGH (ref 8–23)
CO2: 23 mmol/L (ref 22–32)
Calcium: 8.3 mg/dL — ABNORMAL LOW (ref 8.9–10.3)
Chloride: 101 mmol/L (ref 98–111)
Creatinine, Ser: 2.63 mg/dL — ABNORMAL HIGH (ref 0.44–1.00)
GFR, Estimated: 19 mL/min — ABNORMAL LOW (ref >60)
Glucose, Bld: 123 mg/dL — ABNORMAL HIGH (ref 70–99)
Potassium: 3.8 mmol/L (ref 3.5–5.1)
Sodium: 133 mmol/L — ABNORMAL LOW (ref 135–145)
Total Bilirubin: 0.5 mg/dL (ref 0.3–1.2)
Total Protein: 6.5 g/dL (ref 6.5–8.1)

## 2023-05-06 LAB — ECHOCARDIOGRAM COMPLETE
AR max vel: 2.17 cm2
AV Area VTI: 2.13 cm2
AV Area mean vel: 2.11 cm2
AV Mean grad: 3.5 mmHg
AV Peak grad: 5.3 mmHg
Ao pk vel: 1.15 m/s
Area-P 1/2: 5.31 cm2
Calc EF: 50.1 %
Height: 65 in
MV M vel: 5.64 m/s
MV Peak grad: 127.2 mmHg
MV VTI: 1.62 cm2
Radius: 0.8 cm
S' Lateral: 3.7 cm
Single Plane A2C EF: 54.1 %
Single Plane A4C EF: 50.8 %

## 2023-05-06 LAB — CBC
HCT: 28.2 % — ABNORMAL LOW (ref 36.0–46.0)
Hemoglobin: 9.2 g/dL — ABNORMAL LOW (ref 12.0–15.0)
MCH: 31.1 pg (ref 26.0–34.0)
MCHC: 32.6 g/dL (ref 30.0–36.0)
MCV: 95.3 fL (ref 80.0–100.0)
Platelets: 341 10*3/uL (ref 150–400)
RBC: 2.96 MIL/uL — ABNORMAL LOW (ref 3.87–5.11)
RDW: 12.7 % (ref 11.5–15.5)
WBC: 9.2 10*3/uL (ref 4.0–10.5)
nRBC: 0 % (ref 0.0–0.2)

## 2023-05-06 LAB — BLOOD GAS, VENOUS
Acid-Base Excess: 2.9 mmol/L — ABNORMAL HIGH (ref 0.0–2.0)
Bicarbonate: 27.9 mmol/L (ref 20.0–28.0)
O2 Saturation: 83 %
Patient temperature: 37
pCO2, Ven: 43 mmHg — ABNORMAL LOW (ref 44–60)
pH, Ven: 7.42 (ref 7.25–7.43)
pO2, Ven: 49 mmHg — ABNORMAL HIGH (ref 32–45)

## 2023-05-06 LAB — GLUCOSE, CAPILLARY
Glucose-Capillary: 140 mg/dL — ABNORMAL HIGH (ref 70–99)
Glucose-Capillary: 197 mg/dL — ABNORMAL HIGH (ref 70–99)
Glucose-Capillary: 101 mg/dL — ABNORMAL HIGH (ref 70–99)
Glucose-Capillary: 93 mg/dL (ref 70–99)

## 2023-05-06 LAB — PROCALCITONIN: Procalcitonin: <0.1 ng/mL

## 2023-05-06 LAB — CULTURE, BLOOD (ROUTINE X 2)
Culture: NO GROWTH
Culture: NO GROWTH

## 2023-05-06 MED ORDER — OXYMETAZOLINE HCL 0.05 % NA SOLN
1.0000 | Freq: Once | NASAL | Status: DC
  Filled 2023-05-06: qty 15

## 2023-05-06 MED ORDER — POLYETHYLENE GLYCOL 3350 17 G PO PACK
17.0000 g | PACK | Freq: Every day | ORAL | Status: DC
  Administered 2023-05-06 – 2023-05-07 (×2): 17 g via ORAL
  Filled 2023-05-06 (×2): qty 1

## 2023-05-06 MED ORDER — FUROSEMIDE 10 MG/ML IJ SOLN
40.0000 mg | Freq: Once | INTRAMUSCULAR | Status: AC
  Administered 2023-05-06: 40 mg via INTRAVENOUS
  Filled 2023-05-06: qty 4

## 2023-05-06 MED ORDER — FLUTICASONE PROPIONATE 50 MCG/ACT NA SUSP
2.0000 | Freq: Every day | NASAL | Status: DC
  Administered 2023-05-06 – 2023-05-24 (×13): 2 via NASAL
  Filled 2023-05-06 (×3): qty 16

## 2023-05-06 MED ORDER — FUROSEMIDE 10 MG/ML IJ SOLN
80.0000 mg | Freq: Once | INTRAMUSCULAR | Status: AC
  Administered 2023-05-06: 80 mg via INTRAVENOUS
  Filled 2023-05-06: qty 8

## 2023-05-06 MED ORDER — DIPHENHYDRAMINE HCL 50 MG/ML IJ SOLN: 25.0000 mg | Freq: Once | INTRAMUSCULAR | Status: DC

## 2023-05-06 MED ORDER — GUAIFENESIN ER 600 MG PO TB12
600.0000 mg | ORAL_TABLET | Freq: Two times a day (BID) | ORAL | Status: DC
  Administered 2023-05-06 – 2023-05-28 (×34): 600 mg via ORAL
  Filled 2023-05-06 (×35): qty 1

## 2023-05-06 MED ORDER — SALINE SPRAY 0.65 % NA SOLN: 1.0000 | NASAL | Status: DC | PRN

## 2023-05-06 MED ORDER — HYDROMORPHONE HCL 1 MG/ML IJ SOLN
0.5000 mg | Freq: Once | INTRAMUSCULAR | Status: AC
  Administered 2023-05-06: 0.5 mg via INTRAVENOUS

## 2023-05-06 MED ORDER — ALBUTEROL SULFATE (2.5 MG/3ML) 0.083% IN NEBU: 2.5000 mg | INHALATION_SOLUTION | RESPIRATORY_TRACT | Status: DC | PRN

## 2023-05-06 NOTE — Progress Notes (Signed)
[facility]  PROGRESS NOTE  Samantha [name] ZOX:[id number] DOB: [birth year] DOA: 05/05/2023 PCP: Hoy Register, MD   Brief HPI:   72 y.o. female, with medical history of PAD, hypertension, diabetes mellitus type 2, insulin-dependent, history of CVA, depression, anxiety who was just discharged from the hospital on 4/21 after she was treated for UTI/pyelonephritis, pulmonary edema.  Patient was discharged on Lasix 40 mg p.o. daily for 7 days. The patient's daughter who was acting as interpreter, patient developed worsening abdominal discomfort yesterday and also developed chest discomfort.  She denies nausea vomiting or diarrhea.  Denies dysuria or fever.  But complains of chills. In the ED CT chest abdomen/pelvis was done which showed pulmonary edema versus bibasilar infiltrates.  BNP was elevated to 600.    Subjective   Patient seen and examined, still complains of shortness of breath.  Diuresed well with IV Lasix yesterday.   Assessment/Plan:    Acute hypoxemic respiratory failure -Secondary pulmonary edema, bilateral pleural effusion, BNP elevated 601 -Diuresed well with IV Lasix 40 mg IV -This morning patient had worsening of respiratory status, requiring 5 to 6 L of oxygen via nasal cannula -Lasix 40 mg IV was given without much output, 80 mg IV Lasix given per nephrology -Continue strict intake and output monitoring  Pulmonary edema/acute on chronic diastolic heart failure -Likely from underlying diastolic heart failure, BNP elevated 600 -Started on high-dose Lasix as above -Echocardiogram obtained this morning showed EF 55 to 60%, mild LVH, left ventricular diastolic parameters indeterminate  CKD stage IV -Creatinine 2.34, was 2.83 on 04/18/2023 -Started on IV Lasix as above -Nephrology following  ?  Pneumonia -Chest x-ray showed bibasilar patchy opacities, concern for pneumonia -Received 1 dose of Rocephin and Zithromax in the ED -Antibiotics were  discontinued -Check procalcitonin  Abdominal pain -Resolved, patient had abdominal distention yesterday -Also complains of constipation for past 8 days, had BM 1 day prior to coming to the ED -Will start MiraLAX 17 g p.o. daily  Diabetes mellitus type 2 -Glimepiride on hold -Continue insulin 70/30; 20 units subcu twice daily -CBG well-controlled  History of CAD -Continue Plavix  Hypertension -Continue Coreg; amlodipine on hold -Blood pressure is stable   Medications     carvedilol  25 mg Oral BID WC   clopidogrel  75 mg Oral Daily   heparin  5,000 Units Subcutaneous Q8H   insulin aspart  0-9 Units Subcutaneous TID WC   insulin aspart protamine- aspart  20 Units Subcutaneous BID WC   sodium bicarbonate  650 mg Oral TID   sodium chloride flush  3 mL Intravenous Q12H     Data Reviewed:   CBG:  Recent Labs  Lab 05/05/23 2042 05/06/23 0731  GLUCAP 177* 140*    SpO2: 91 % O2 Flow Rate (L/min): 4.5 L/min    Vitals:   05/05/23 1848 05/05/23 2257 05/05/23 2310 05/06/23 0157  BP: (!) 152/74  (!) 149/75 (!) 142/76  Pulse: 87  90 91  Resp: 20  18 17   Temp: 97.8 F (36.6 C)  98.2 F (36.8 C) 98.4 F (36.9 C)  TempSrc: Oral  Oral Oral  SpO2: 90%  91% 91%  Height:  5\' 5"  (1.651 m)        Data Reviewed:  Basic Metabolic Panel: Recent Labs  Lab 05/05/23 0927 05/06/23 0518  NA 133* 133*  K 3.4* 3.8  CL 102 101  CO2 20* 23  GLUCOSE 201* 123*  BUN 25* 29*  CREATININE 2.34* 2.63*  CALCIUM 8.7*  8.3*    CBC: Recent Labs  Lab 05/05/23 0927 05/06/23 0518  WBC 7.5 9.2  HGB 10.6* 9.2*  HCT 31.5* 28.2*  MCV 92.9 95.3  PLT 390 341    LFT Recent Labs  Lab 05/05/23 0927 05/06/23 0518  AST 21 21  ALT 17 17  ALKPHOS 67 61  BILITOT 0.8 0.5  PROT 7.0 6.5  ALBUMIN 3.6 3.3*     Antibiotics: Anti-infectives (From admission, onward)    Start     Dose/Rate Route Frequency Ordered Stop   05/05/23 1515  cefTRIAXone (ROCEPHIN) 1 g in sodium chloride  0.9 % 100 mL IVPB        1 g 200 mL/hr over 30 Minutes Intravenous  Once 05/05/23 1510 05/05/23 1642   05/05/23 1515  azithromycin (ZITHROMAX) 500 mg in sodium chloride 0.9 % 250 mL IVPB        500 mg 250 mL/hr over 60 Minutes Intravenous  Once 05/05/23 1510 05/05/23 1750        DVT prophylaxis: Heparin  Code Status: Full code  Family Communication:    CONSULTS    Objective    Physical Examination:   General-appears in no acute distress Heart-S1-S2, regular, no murmur auscultated Lungs-bibasilar crackles auscultated Abdomen-soft, nontender, no organomegaly Extremities-no edema in the lower extremities Neuro-alert, oriented x3, no focal deficit noted  Status is: Inpatient:             Meredeth Ide   [facility] If 7PM-7AM, please contact night-coverage at [email or web address], Office  [phone]   05/06/2023, 8:53 AM  LOS: 1 day

## 2023-05-06 NOTE — Progress Notes (Signed)
Secure chat sent to Dr. Juel Burrow, Nephrologist who visited pt today & ordered additional Lasix 80 mg & benadryl 25 mg IV. Confirmed order associated with most recent BP. Dr. Juel Burrow responded to "Hold the Benadryl and give the lasix 80 "

## 2023-05-06 NOTE — Progress Notes (Signed)
Per Dr. Juel Burrow, "If her BP is ok after the lasix and map greater than 70 then it should be fine to give". Will reassess prior to next meds due

## 2023-05-06 NOTE — TOC Initial Note (Addendum)
Transition of Care [facility]) - Initial/Assessment Note    Patient Details  Name: Samantha [name] MRN: [id number] Date of Birth: 08/14/1951  Transition of Care [facility]) CM/SW Contact:    Lanier Clam, RN Phone Number: 05/06/2023, 2:36 PM  Clinical Narrative: Spansih speaking. Noted dtr can interpret or use tel interpreter video. No SS# Noted PCP-[place]. On 02 monitor.                  Expected Discharge Plan: Home/Self Care Barriers to Discharge: Continued Medical Work up   Patient Goals and CMS Choice Patient states their goals for this hospitalization and ongoing recovery are:: Home CMS Medicare.gov Compare Post Acute Care list provided to:: Patient Represenative (must comment) Choice offered to / list presented to : Adult Children [place] ownership interest in [facility].provided to:: Adult Children    Expected Discharge Plan and Services   Discharge Planning Services: CM Consult   Living arrangements for the past 2 months: Single Family Home                                      Prior Living Arrangements/Services Living arrangements for the past 2 months: Single Family Home Lives with:: Adult Children Patient language and need for interpreter reviewed:: Yes Do you feel safe going back to the place where you live?: Yes      Need for Family Participation in Patient Care: Yes (Comment) Care giver support system in place?: Yes (comment)   Criminal Activity/Legal Involvement Pertinent to Current Situation/Hospitalization: No - Comment as needed  Activities of Daily Living Home Assistive Devices/Equipment: None ADL Screening (condition at time of admission) Patient's cognitive ability adequate to safely complete daily activities?: Yes Is the patient deaf or have difficulty hearing?: No Does the patient have difficulty seeing, even when wearing glasses/contacts?: No Does the patient have difficulty concentrating, remembering, or making decisions?:  No Patient able to express need for assistance with ADLs?: Yes Does the patient have difficulty dressing or bathing?: No Independently performs ADLs?: Yes (appropriate for developmental age) Does the patient have difficulty walking or climbing stairs?: No Weakness of Legs: Both Weakness of Arms/Hands: None  Permission Sought/Granted Permission sought to share information with : Case Manager Permission granted to share information with : Yes, Verbal Permission Granted  Share Information with NAME: Case manager           Emotional Assessment Appearance:: Appears stated age Attitude/Demeanor/Rapport: Gracious Affect (typically observed): Accepting Orientation: : Oriented to Self, Oriented to Place, Oriented to  Time, Oriented to Situation Alcohol / Substance Use: Not Applicable Psych Involvement: No (comment)  Admission diagnosis:  Pulmonary infiltrate [R91.8] Pleural effusion [J90] Gallstones [K80.20] Hypoxia [R09.02] Non-productive cough [R05.8] Elevated troponin [R79.89] Upper abdominal pain [R10.10] Generalized weakness [R53.1] Essential hypertension [I10] Acute on chronic combined systolic and diastolic CHF (congestive heart failure) (HCC) [I50.43] Acute hypoxemic respiratory failure (HCC) [J96.01] Stage 4 chronic kidney disease (HCC) [N18.4] Patient Active Problem List   Diagnosis Date Noted   Acute hypoxemic respiratory failure (HCC) 05/05/2023   Pyelonephritis 04/14/2023   Sepsis secondary to UTI (HCC) 04/14/2023   AKI (acute kidney injury) (HCC) 04/14/2023   History of CVA (cerebrovascular accident) 04/14/2023   Acute metabolic encephalopathy 04/14/2023   Hypoglycemia 04/14/2023   Aortic root aneurysm (HCC) 11/19/2021   CVA (cerebral vascular accident) (HCC) 11/08/2021   Anxiety 04/27/2017   Non compliance w medication regimen 12/23/2016  History of ulceration 03/30/2016   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   Type 2  diabetes mellitus with complication, with long-term current use of insulin (HCC) 02/06/2016   Healthcare maintenance 02/06/2016   Critical lower limb ischemia (HCC) 05/16/2015   Hyperlipidemia 04/12/2015   Depression 04/04/2015   PAD (peripheral artery disease) (HCC) 04/04/2015   Diabetic ulcer of left foot associated with type 2 diabetes mellitus (HCC) 02/25/2015   Diabetic neuropathy, painful (HCC) 02/25/2015   Major depressive disorder, recurrent episode, moderate (HCC) 02/25/2015   Essential hypertension 01/13/2015   Protein-calorie malnutrition, severe (HCC) 01/11/2015   Diabetic toe ulcer (HCC) 01/10/2015   Uncontrolled diabetes mellitus 01/10/2015   PCP:  Hoy Register, MD Pharmacy:   [facility] [address], [address] [place] [address] Phone: [phone] Fax: [phone]  [id number] [place], [place] [address] [place] [address] Phone: [phone] Fax: [phone]     Social Determinants of Health (SDOH) Social History: SDOH Screenings   Food Insecurity: No Food Insecurity (05/05/2023)  Housing: Low Risk  (05/05/2023)  Transportation Needs: No Transportation Needs (05/05/2023)  Utilities: Not At Risk (05/05/2023)  Depression (PHQ2-9): Low Risk  (11/19/2021)  Tobacco Use: Low Risk  (05/05/2023)   SDOH Interventions:     Readmission Risk Interventions     No data to display

## 2023-05-06 NOTE — Progress Notes (Signed)
    Patient Name: Samantha [name]           DOB: 11-17-1951  MRN: [id number]      Admission Date: 05/05/2023  Attending Provider: Meredeth Ide, MD  Primary Diagnosis: Acute hypoxemic respiratory failure [facility])   Level of care: Telemetry    CROSS COVER NOTE   Date of Service   05/06/2023   [name], 72 y.o. female, was admitted on 05/05/2023 for Acute hypoxemic respiratory failure (HCC) secondary to pulmonary edema, bilateral pleural effusion.  BNP elevated 600.  Recent echo showed EF 55 to 60%. Patient was started on IV Lasix, received ~120 mg IV Lasix during daytime.   HPI/Events of Note   Bedside RN reports increased work of breathing, O2 sat 83% on 7 L HFNC.   Oxygen was titrated to 15 L, O2 sat maintaining 88-90 %.   During bedside assessment, patient is alert and oriented x 4.  Appears to be in acute respiratory distress. Overall, patient endorses feeling tired and short of breath.  She denies chest pain, palpitations, abdominal discomfort, nausea, vomiting.  Patient is tachypneic, + accessory muscles.  Communicates with some difficulty due to shortness of breath.  Lung sounds wheezing and coarse.   Spoke with family and patient at bedside, recommendation is for patient to be placed on BiPAP given her work of breathing. Will place orders for chest x-ray, Lasix, VBG, nebs.  Chest x-ray similar to prior, interpretation: "small to moderate pleural effusion bilaterally. Stable patchy airspace disease in the mid to lower lung fields bilaterally, possible edema or infiltrate."   Interventions/ Plan   Nebs Chest x-ray VBG IV Lasix BiPAP        Anthoney Harada, DNP, ACNPC- AG [facility] [place]

## 2023-05-06 NOTE — Progress Notes (Signed)
Pt placed on bipap per MD, tolerating well at this time. 

## 2023-05-06 NOTE — Progress Notes (Signed)
[facility] Progress Note   72 y.o. female HTN, DM, PAD, CVA, HLD, CKD3b/4 recently hospitalized with sepsis secondary to a E.Coli UTI/ pyelonephritis in the setting or ARB + NSAIDs and worsening renal function. She was initially given isotonic fluids with no improvement in renal function and nephrology was consulted during the admission as well. Creatinine had ranged from 1.5-1.7 in 10/2021 and then from 4/15-4/21 ranged from 2.56-2.88. With no improvement in renal function with fluid resuscitation + congestion on CXR + cough she was diuresed and actually sent out with Lasix. Her creatinine of 2.88 at time of discharge has actually improved to the 2.4 range at presentation. She took 7 days of Lasix 40mg  until she ran out but was feeling worse; she is not able to describe the exact feeling. She has had a poor appetite over the past week with chills but no fevers. She has had a cough productive of only white sputum. She has only taken ibuprofen once over the past week. She denies dysuria, hematuria, but has not had a bowel movement in days with no h/o diarrhea. She has had abdominal fullness as well.    ROS  Assessment/ Plan:   AKI on CKD 3b - b/l creat 1.5- 1.7 from nov 2022. Creat here prior admission from 4/15-4/21 ranged from 2.56-2.88 when she was admitted in setting of sepsis due to UTI/ pyelonephritis + ARB + nsaid's at home. Fluids were stopped during that admission with no improvement in renal function and worsening CXR findings. We were hopeful that there was an acute component and there may be a minor acute component of renal failure but she has certainly progressed since 10/2021 with likely CKD4. - FeNA <1% which can be c/w CHF. - Lasix 40mg  IV given for 2 days and poor response; no e/o obstruction on ultrasound but there is thinning of the cortex c/w advanced CKD.  Will high higher dose of Lasix and monitor response; Benadryl for the possible Dilaudid allergy. She definitely  felt more difficulty breathing after the Dilaudid.  - May be helpful to attempt a thoracentesis if there is a significant effusion; right appears to be greater than the left.   -Maintain MAP>65 for optimal renal perfusion.  - Avoid nephrotoxic medications including NSAIDs and iodinated intravenous contrast exposure unless the latter is absolutely indicated.   - Preferred narcotic agents for pain control are hydromorphone, fentanyl, and methadone. Morphine should not be used.  - Avoid Baclofen and avoid oral sodium phosphate and magnesium citrate based laxatives / bowel preps.  - Continue strict Input and Output monitoring. Will monitor the patient closely with you and intervene or adjust therapy as indicated by changes in clinical status/labs     SOB / cough - being treated for PNA + diuresis. H/o recent Sepsis/ UTI - w/ cx growing out [facility]. Patient had been d/c with Keflex. DM2 - per pmd H/o CVA HTN - coreg and norvasc, hydralazine on hold.  Subjective:   Feeling more short of breath after the dilaudid this morning   Objective:   BP (!) 142/76 (BP Location: Left Arm)   Pulse 91   Temp 98.4 F (36.9 C) (Oral)   Resp (!) 22   Ht 5\' 5"  (1.651 m)   SpO2 91%   BMI 26.09 kg/m   Intake/Output Summary (Last 24 hours) at 05/06/2023 1307 Last data filed at 05/06/2023 0430 Gross per 24 hour  Intake 710 ml  Output --  Net 710 ml   Weight change:  Physical Exam: Gen alert, tachypneic, more dyspneic supine, on O2 Chest poor air movement but no rales Tachy Abd soft ntnd no mass or ascites  Ext no LE or UE edema, no wounds or ulcers Neuro is alert, Ox 3 , nf  Imaging: US RENAL  Result Date: 05/05/2023 CLINICAL DATA:  Chronic kidney disease. EXAM: RENAL / URINARY TRACT ULTRASOUND COMPLETE COMPARISON:  CT of the chest, abdomen, pelvis on 05/05/2023 FINDINGS: Right Kidney: Renal measurements: 10.5 x 4.2 x 5.5 centimeters = volume: 125.1 mL. There is narrowing of the renal cortex. Normal  renal echogenicity. No hydronephrosis or suspicious renal mass. Left Kidney: Renal measurements: 9.2 x 4.3 x 3.6 centimeters = volume: 13.8 mL. There is renal parenchymal thinning. Normal renal echogenicity. No hydronephrosis or suspicious renal mass. Bladder: Appears normal for degree of bladder distention. Other: None. IMPRESSION: No hydronephrosis. Renal parenchymal thinning bilaterally. Electronically Signed   By: Norva Pavlov M.D.   On: 05/05/2023 18:49   CT CHEST ABDOMEN PELVIS WO CONTRAST  Result Date: 05/05/2023 CLINICAL DATA:  Sepsis.  Increasing O2 requirements. EXAM: CT CHEST, ABDOMEN AND PELVIS WITHOUT CONTRAST TECHNIQUE: Multidetector CT imaging of the chest, abdomen and pelvis was performed following the standard protocol without IV contrast. RADIATION DOSE REDUCTION: This exam was performed according to the departmental dose-optimization program which includes automated exposure control, adjustment of the mA and/or kV according to patient size and/or use of iterative reconstruction technique. COMPARISON:  Chest x-ray earlier 05/05/2023. Ultrasound abdomen 05/05/2023. CT scan 04/14/2023. FINDINGS: CT CHEST FINDINGS Cardiovascular: Heart is nonenlarged. Trace pericardial fluid or thickening. Coronary artery calcifications are seen. The thoracic aorta has a normal course and caliber with scattered vascular calcifications. Bovine type aortic arch. Significant calcified plaque of the left subclavian artery. Mediastinum/Nodes: Small hiatal hernia. Normal caliber thoracic esophagus. Heterogeneous small thyroid gland. No specific abnormal lymph node enlargement seen in the axillary region or hila on this noncontrast exam. There are some prominent less than 1 cm in size in short axis mediastinal nodes, nonpathologic by size criteria. Example right of midline, precarinal measures 8 mm in short axis on series 2, image 23. These nodes are slightly more numerous than usually seen however. Lungs/Pleura:  Developing small to moderate bilateral pleural effusions, right-greater-than-left. Adjacent parenchymal opacities in the lower lobes. Consolidation is possible. There are some patchy ground-glass and interstitial changes seen along the upper lung zones with thickening of the interstitial septa. Please correlate for a component of edema. No pneumothorax. Musculoskeletal: Mild degenerative changes along the spine. CT ABDOMEN PELVIS FINDINGS Hepatobiliary: Dilated gallbladder with some dependent stones. This was seen previously. Please correlate with separate ultrasound from 05/05/2023 earlier preserved hepatic parenchyma on this noncontrast examination otherwise. Pancreas: Moderate atrophy of the pancreas.  No obvious mass. Spleen: Splenic granulomas. Adrenals/Urinary Tract: The adrenal glands are preserved. Mild bilateral renal atrophy with some perinephric stranding. Multiple calcifications along each kidney could be vascular rather than collecting system. No collecting system dilatation. No abnormal calcification seen within either ureter. Bladder is underdistended. There are low-lying pelvic structures. Please correlate for prolapse. Stomach/Bowel: On this non oral contrast exam, the large bowel has a normal course and caliber with scattered colonic stool. The stomach and small bowel are nondilated. Normal appendix in the right lower quadrant extending inferior to the cecum. Vascular/Lymphatic: Diffuse vascular calcifications identified particularly of branch vessels. Normal caliber aorta and IVC. No specific abnormal lymph node enlargement identified in the abdomen and pelvis. Reproductive: Status post hysterectomy. No adnexal masses. Other: Small focal  wide mouth hernia seen along the anterior pelvic wall in the right side lateral to the margin of the right rectus abdominis muscle containing a loop of small bowel. No obstruction. This is unchanged from previous. Small fat containing umbilical hernia.  Musculoskeletal: Scattered degenerative changes seen of the spine and pelvis. IMPRESSION: New bilateral pleural effusions, right-greater-than-left with the adjacent parenchymal opacities. Infiltrates are possible and recommend follow-up. There is also a component of interstitial septal thickening which is new. A component of edema is possible. No bowel obstruction, free air or free fluid with scattered stool. No obstructing renal stones.  Mild bilateral renal atrophy. Dilated gallbladder with a dependent stone. No adjacent inflammatory changes by noncontrast CT. Please correlate with the prior ultrasound. If there is further concern, HIDA scan may be of some benefit. Significant diffuse vascular calcifications. 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 Please correlate with any particular symptoms. Electronically Signed   By: Karen Kays M.D.   On: 05/05/2023 14:58   US Abdomen Limited RUQ (LIVER/GB)  Result Date: 05/05/2023 CLINICAL DATA:  Right upper quadrant pain for several weeks. EXAM: ULTRASOUND ABDOMEN LIMITED RIGHT UPPER QUADRANT COMPARISON:  04/14/2023 FINDINGS: Gallbladder: Gallbladder appears distended, similar to CT from 04/14/2023. Multiple stones are identified measuring up to 8 mm. No gallbladder wall thickening, pericholecystic fluid, or sludge. Negative sonographic Murphy's sign (note: patient received pain medication prior to ultrasound). Common bile duct: Diameter: 5.6 mm Liver: No focal lesion identified. Mild increased parenchymal echogenicity. Portal vein is patent on color Doppler imaging with normal direction of blood flow towards the liver. Other: None. IMPRESSION: 1. Gallstones. No gallbladder wall thickening, pericholecystic fluid, or sludge. Negative sonographic Murphy's sign (note: patient received pain medication prior to ultrasound). 2. Mild increased hepatic parenchymal echogenicity suggestive of steatosis. Electronically Signed   By:  Signa Kell M.D.   On: 05/05/2023 11:56   DG Chest Port 1 View  Result Date: 05/05/2023 CLINICAL DATA:  Generalized chest pain, shortness of breath, and cough EXAM: PORTABLE CHEST 1 VIEW COMPARISON:  Chest radiograph dated 04/17/2023 FINDINGS: Low lung volumes with bronchovascular crowding. Bilateral perihilar interstitial opacities and bibasilar patchy opacities. Increased moderate bilateral pleural effusions. No pneumothorax. The heart size and mediastinal contours are within normal limits. No acute osseous abnormality. IMPRESSION: 1. Pulmonary edema with increased moderate bilateral pleural effusions. 2. Bibasilar patchy opacities, likely atelectasis. Aspiration or pneumonia can be considered in the appropriate clinical setting. Electronically Signed   By: Agustin Cree M.D.   On: 05/05/2023 11:38    Labs: BMET Recent Labs  Lab 05/05/23 0927 05/06/23 0518  NA 133* 133*  K 3.4* 3.8  CL 102 101  CO2 20* 23  GLUCOSE 201* 123*  BUN 25* 29*  CREATININE 2.34* 2.63*  CALCIUM 8.7* 8.3*   CBC Recent Labs  Lab 05/05/23 0927 05/06/23 0518  WBC 7.5 9.2  HGB 10.6* 9.2*  HCT 31.5* 28.2*  MCV 92.9 95.3  PLT 390 341    Medications:     carvedilol  25 mg Oral BID WC   clopidogrel  75 mg Oral Daily   heparin  5,000 Units Subcutaneous Q8H   insulin aspart  0-9 Units Subcutaneous TID WC   insulin aspart protamine- aspart  20 Units Subcutaneous BID WC   polyethylene glycol  17 g Oral Daily   sodium bicarbonate  650 mg Oral TID   sodium chloride flush  3 mL Intravenous Q12H      Paulene Floor,  MD 05/06/2023, 1:07 PM

## 2023-05-06 NOTE — Progress Notes (Addendum)
Via secure chat, Notified both Dr. Sharl Ma & Dr. Juel Burrow; pt has only urinated a total of 400 ml today. 200 ml post Lasix 40 mg IV & 200 ml after Lasix 80 mg IV. Bladder scanned immediately after last void. 3/3 scans each resulted 0 ml in bladder.

## 2023-05-06 NOTE — Progress Notes (Signed)
Pt O2 decrease to 85% on 5 L. Increased to 6 L. If sitting in a total upright position @ 90 degrees & awake, O2 increases to 90% on 6 L. When pt lies back, O2 decreases to 88% on 6L.  RT. Here now to assess. Placing pt on humidity & changing Oxygen flow to [place]

## 2023-05-06 NOTE — Progress Notes (Signed)
Pt had episode when she c/o of anterior chest pressure. Denied any radiating pain, numbness tingling. Vs & stat EKG. NSR. Pt stated pressure subsided after VS & EKG were completed.   Notified Dr. Sharl Ma & HS RN updated.

## 2023-05-07 ENCOUNTER — Inpatient Hospital Stay (HOSPITAL_COMMUNITY): Payer: Medicaid Other

## 2023-05-07 DIAGNOSIS — J90 Pleural effusion, not elsewhere classified: Secondary | ICD-10-CM

## 2023-05-07 DIAGNOSIS — R1010 Upper abdominal pain, unspecified: Secondary | ICD-10-CM

## 2023-05-07 LAB — CBC
HCT: 27.3 % — ABNORMAL LOW (ref 36.0–46.0)
Hemoglobin: 8.9 g/dL — ABNORMAL LOW (ref 12.0–15.0)
MCH: 31.2 pg (ref 26.0–34.0)
MCHC: 32.6 g/dL (ref 30.0–36.0)
MCV: 95.8 fL (ref 80.0–100.0)
Platelets: 285 10*3/uL (ref 150–400)
RBC: 2.85 MIL/uL — ABNORMAL LOW (ref 3.87–5.11)
RDW: 12.5 % (ref 11.5–15.5)
WBC: 7 10*3/uL (ref 4.0–10.5)
nRBC: 0 % (ref 0.0–0.2)

## 2023-05-07 LAB — COMPREHENSIVE METABOLIC PANEL
ALT: 22 U/L (ref 0–44)
AST: 27 U/L (ref 15–41)
Albumin: 3.1 g/dL — ABNORMAL LOW (ref 3.5–5.0)
Alkaline Phosphatase: 63 U/L (ref 38–126)
Anion gap: 9 (ref 5–15)
BUN: 35 mg/dL — ABNORMAL HIGH (ref 8–23)
CO2: 25 mmol/L (ref 22–32)
Calcium: 8.4 mg/dL — ABNORMAL LOW (ref 8.9–10.3)
Chloride: 99 mmol/L (ref 98–111)
Creatinine, Ser: 2.83 mg/dL — ABNORMAL HIGH (ref 0.44–1.00)
GFR, Estimated: 18 mL/min — ABNORMAL LOW (ref >60)
Glucose, Bld: 69 mg/dL — ABNORMAL LOW (ref 70–99)
Potassium: 3.9 mmol/L (ref 3.5–5.1)
Sodium: 133 mmol/L — ABNORMAL LOW (ref 135–145)
Total Bilirubin: 0.7 mg/dL (ref 0.3–1.2)
Total Protein: 6.4 g/dL — ABNORMAL LOW (ref 6.5–8.1)

## 2023-05-07 LAB — GLUCOSE, CAPILLARY
Glucose-Capillary: 65 mg/dL — ABNORMAL LOW (ref 70–99)
Glucose-Capillary: 155 mg/dL — ABNORMAL HIGH (ref 70–99)
Glucose-Capillary: 171 mg/dL — ABNORMAL HIGH (ref 70–99)
Glucose-Capillary: 159 mg/dL — ABNORMAL HIGH (ref 70–99)
Glucose-Capillary: 144 mg/dL — ABNORMAL HIGH (ref 70–99)
Glucose-Capillary: 208 mg/dL — ABNORMAL HIGH (ref 70–99)

## 2023-05-07 LAB — PROTEIN / CREATININE RATIO, URINE
Creatinine, Urine: 33 mg/dL
Protein Creatinine Ratio: 1.15 mg/mg{Cre} — ABNORMAL HIGH (ref 0.00–0.15)
Total Protein, Urine: 38 mg/dL

## 2023-05-07 LAB — HEPATITIS B SURFACE ANTIGEN: Hepatitis B Surface Ag: NONREACTIVE

## 2023-05-07 LAB — HEPATITIS C ANTIBODY: HCV Ab: NONREACTIVE

## 2023-05-07 LAB — CULTURE, BLOOD (ROUTINE X 2)

## 2023-05-07 LAB — LACTATE DEHYDROGENASE: LDH: 159 U/L (ref 98–192)

## 2023-05-07 MED ORDER — CEFAZOLIN SODIUM-DEXTROSE 2-4 GM/100ML-% IV SOLN: 2.0000 g | INTRAVENOUS | Status: AC

## 2023-05-07 MED ORDER — FUROSEMIDE 10 MG/ML IJ SOLN
120.0000 mg | Freq: Once | INTRAVENOUS | Status: AC
  Administered 2023-05-07: 120 mg via INTRAVENOUS
  Filled 2023-05-07: qty 10

## 2023-05-07 NOTE — Progress Notes (Signed)
[facility]  PROGRESS NOTE  Samantha [name] WUJ:[id number] DOB: 1951-03-03 DOA: 05/05/2023 PCP: Hoy Register, MD   Brief HPI:   72 y.o. female, with medical history of PAD, hypertension, diabetes mellitus type 2, insulin-dependent, history of CVA, depression, anxiety who was just discharged from the hospital on 4/21 after she was treated for UTI/pyelonephritis, pulmonary edema.  Patient was discharged on Lasix 40 mg p.o. daily for 7 days. The patient's daughter who was acting as interpreter, patient developed worsening abdominal discomfort yesterday and also developed chest discomfort.  She denies nausea vomiting or diarrhea.  Denies dysuria or fever.  But complains of chills. In the ED CT chest abdomen/pelvis was done which showed pulmonary edema versus bibasilar infiltrates.  BNP was elevated to 600.    Subjective   Patient seen and examined, worsening respiratory status now requiring 15 L of oxygen HFNC.  Lasix not effective.   Assessment/Plan:    Acute hypoxemic respiratory failure -Secondary pulmonary edema, bilateral pleural effusion, BNP elevated 601 -Procalcitonin less than 0.10 -Diuresed well with IV Lasix 40 mg IV; however not diuresing well despite high dose of Lasix per nephrology -Now requiring 15 L of oxygen HFNC -Plan to transfer to [facility], insert HD catheter and initiate urgent dialysis   Pulmonary edema/acute on chronic diastolic heart failure -Likely from underlying diastolic heart failure, BNP elevated 600 -Chest x-ray shows pulmonary edema -Started on high-dose Lasix as above without any improvement -Echocardiogram obtained this morning showed EF 55 to 60%, mild LVH, left ventricular diastolic parameters indeterminate  CKD stage IV -Creatinine is worse at 2.83 today -Started on IV Lasix as above -Nephrology following  ?  Pneumonia -Chest x-ray showed bibasilar patchy opacities, concern for pneumonia -Received 1 dose of Rocephin and Zithromax in  the ED -Antibiotics were discontinued -Procalcitonin less than 0.10  Abdominal pain -Resolved, patient had abdominal distention yesterday -Also complains of constipation for past 8 days, had BM 1 day prior to coming to the ED -Continue MiraLAX 17 g p.o. daily  Diabetes mellitus type 2 -Glimepiride on hold -She was hypoglycemic this morning, will discontinue Novolin 70/30 -CBG well-controlled; continue sliding scale insulin with NovoLog  History of CAD -Continue Plavix  Hypertension -Continue Coreg; amlodipine on hold -Blood pressure is stable  Will transfer the patient [facility] for urgent hemod[facility]   Medications     carvedilol  25 mg Oral BID WC   clopidogrel  75 mg Oral Daily   diphenhydrAMINE  25 mg Intravenous Once   fluticasone  2 spray Each Nare Daily   guaiFENesin  600 mg Oral BID   heparin  5,000 Units Subcutaneous Q8H   insulin aspart  0-9 Units Subcutaneous TID WC   oxymetazoline  1 spray Each Nare Once   polyethylene glycol  17 g Oral Daily   sodium bicarbonate  650 mg Oral TID   sodium chloride flush  3 mL Intravenous Q12H     Data Reviewed:   CBG:  Recent Labs  Lab 05/06/23 0731 05/06/23 1149 05/06/23 1646 05/06/23 2125 05/07/23 0758  GLUCAP 140* 197* 101* 93 65*    SpO2: (!) 86 % O2 Flow Rate (L/min): 15 L/min FiO2 (%): 70 %    Vitals:   05/06/23 1720 05/06/23 2014 05/07/23 0248 05/07/23 0618  BP: (!) 145/68 130/64  (!) 134/58  Pulse: 81 74  67  Resp: (!) 22 20  19   Temp: 98.5 F (36.9 C) 99 F (37.2 C)  98.6 F (37 C)  TempSrc:  Oral Oral  Oral  SpO2: 91% (!) 88% 91% (!) 86%  Height:          Data Reviewed:  Basic Metabolic Panel: Recent Labs  Lab 05/05/23 0927 05/06/23 0518 05/07/23 0532  NA 133* 133* 133*  K 3.4* 3.8 3.9  CL 102 101 99  CO2 20* 23 25  GLUCOSE 201* 123* 69*  BUN 25* 29* 35*  CREATININE 2.34* 2.63* 2.83*  CALCIUM 8.7* 8.3* 8.4*    CBC: Recent Labs  Lab 05/05/23 0927  05/06/23 0518 05/07/23 0532  WBC 7.5 9.2 7.0  HGB 10.6* 9.2* 8.9*  HCT 31.5* 28.2* 27.3*  MCV 92.9 95.3 95.8  PLT 390 341 285    LFT Recent Labs  Lab 05/05/23 0927 05/06/23 0518 05/07/23 0532  AST 21 21 27   ALT 17 17 22   ALKPHOS 67 61 63  BILITOT 0.8 0.5 0.7  PROT 7.0 6.5 6.4*  ALBUMIN 3.6 3.3* 3.1*     Antibiotics: Anti-infectives (From admission, onward)    Start     Dose/Rate Route Frequency Ordered Stop   05/05/23 1515  cefTRIAXone (ROCEPHIN) 1 g in sodium chloride 0.9 % 100 mL IVPB        1 g 200 mL/hr over 30 Minutes Intravenous  Once 05/05/23 1510 05/05/23 1642   05/05/23 1515  azithromycin (ZITHROMAX) 500 mg in sodium chloride 0.9 % 250 mL IVPB        500 mg 250 mL/hr over 60 Minutes Intravenous  Once 05/05/23 1510 05/05/23 1750        DVT prophylaxis: Heparin  Code Status: Full code  Family Communication:    CONSULTS    Objective    Physical Examination:  General-appears in no acute distress Heart-S1-S2, regular, no murmur auscultated Lungs-decreased breath sound lung bases, bilateral crackles throughout lung fields Abdomen-soft, nontender, no organomegaly Extremities-no edema in the lower extremities Neuro-alert, oriented x3, no focal deficit noted  Status is: Inpatient:             Payge Eppes S Sherry Blackard   [facility] If 7PM-7AM, please contact night-coverage at [email or web address], Office  [phone]   05/07/2023, 8:17 AM  LOS: 2 days

## 2023-05-07 NOTE — Progress Notes (Signed)
PT taken off bipap for break, will access PT in one hour.

## 2023-05-07 NOTE — Inpatient Diabetes Management (Signed)
Inpatient Diabetes Program Recommendations  AACE/ADA: New Consensus Statement on Inpatient Glycemic Control (2015)  Target Ranges:  Prepandial:   less than 140 mg/dL      Peak postprandial:   less than 180 mg/dL (1-2 hours)      Critically ill patients:  140 - 180 mg/dL    Latest Reference Range & Units 05/06/23 07:31 05/06/23 11:49 05/06/23 16:46 05/06/23 21:25  Glucose-Capillary 70 - 99 mg/dL 161 (H)  1 unit Novolog @1043   20 units 70/30 Insulin @1043  197 (H)  2 units Novolog  101 (H)      20 units 70/30 Insulin @1732  93  (H): Data is abnormally high  Latest Reference Range & Units 05/07/23 07:58  Glucose-Capillary 70 - 99 mg/dL 65 (L)  (L): Data is abnormally low   Admit with: Acute hypoxemic respiratory failure-insetting of pulmonary edema, bilateral pleural effusions   History: DM2, CKD  Home DM Meds: Humulin 70/30 Insulin 30 units AM/ 26 units PM        Amaryl 4 mg daily  Current Orders: 70/30 Insulin 20 units BID      Novolog Sensitive Correction Scale/ SSI (0-9 units) TID AC   MD- Note CBG down to 65 this AM  May consider reducing the PM dose of the 70/30 Insulin to 15 units QPM    --Will follow patient during hospitalization--  Ambrose Finland RN, MSN, CDCES Diabetes Coordinator Inpatient Glycemic Control Team Team Pager: [phone] (8a-5p)

## 2023-05-07 NOTE — TOC Progression Note (Signed)
Transition of Care [facility]) - Progression Note    Patient Details  Name: Samantha [name] MRN: [id number] Date of Birth: [birth year]  [facility] CM/SW Contact  Gelena Klosinski, Olegario Messier, RN Phone Number: 05/07/2023, 3:12 PM  Clinical Narrative: Noted for Acute to acute transfer to [facility] for HD. [facility] transfer nsg to manage. No further CM needs.      Expected Discharge Plan: Acute to Acute Transfer Barriers to Discharge: Continued Medical Work up  Expected Discharge Plan and Services   Discharge Planning Services: CM Consult   Living arrangements for the past 2 months: Single Family Home                                       Social Determinants of Health (SDOH) Interventions SDOH Screenings   Food Insecurity: No Food Insecurity (05/05/2023)  Housing: Low Risk  (05/05/2023)  Transportation Needs: No Transportation Needs (05/05/2023)  Utilities: Not At Risk (05/05/2023)  Depression (PHQ2-9): Low Risk  (11/19/2021)  Tobacco Use: Low Risk  (05/05/2023)    Readmission Risk Interventions     No data to display

## 2023-05-07 NOTE — Progress Notes (Signed)
[facility] Progress Note   72 y.o. female HTN, DM, PAD, CVA, HLD, CKD3b/4 recently hospitalized with sepsis secondary to a E.Coli UTI/ pyelonephritis in the setting or ARB + NSAIDs and worsening renal function. She was initially given isotonic fluids with no improvement in renal function and nephrology was consulted during the admission as well. Creatinine had ranged from 1.5-1.7 in 10/2021 and then from 4/15-4/21 ranged from 2.56-2.88. With no improvement in renal function with fluid resuscitation + congestion on CXR + cough she was diuresed and actually sent out with Lasix. Her creatinine of 2.88 at time of discharge has actually improved to the 2.4 range at presentation. She took 7 days of Lasix 40mg  until she ran out but was feeling worse; she is not able to describe the exact feeling. She has had a poor appetite over the past week with chills but no fevers. She has had a cough productive of only white sputum. She has only taken ibuprofen once over the past week. She denies dysuria, hematuria, but has not had a bowel movement in days with no h/o diarrhea. She has had abdominal fullness as well.    ROS  Assessment/ Plan:   AKI on CKD 3b - b/l creat 1.5- 1.7 from nov 2022. Creat here prior admission from 4/15-4/21 ranged from 2.56-2.88 when she was admitted in setting of sepsis due to UTI/ pyelonephritis + ARB + nsaid's at home. Fluids were stopped during that admission with no improvement in renal function and worsening CXR findings. We were hopeful that there was an acute component and there may be a minor acute component of renal failure but she has certainly progressed since 10/2021 with likely CKD4. Inactive urine sediment. - FeNA <1% which can be c/w CHF. - Lasix 40mg  IV given for 2 days and poor response; no e/o obstruction on ultrasound but there is thinning of the cortex c/w advanced CKD. Not a great response to 80mg . She's made only 545ml/24hrs and is net 1570 ml during this  hospitalization.  Lasix 120mg  x1 and will monitor response; will also send serologies and markers for hemolysis. Marland Kitchen Hb dropping and I wonder if it would be beneficial to perform a bronch to ensure no pulm bleeding -> would have low threshold to PLEX if this is a pulmonary renal syndrome.   - May be helpful to attempt a thoracentesis if there is a significant effusion; right appears to be greater than the left.   -Maintain MAP>65 for optimal renal perfusion.  - Avoid nephrotoxic medications including NSAIDs and iodinated intravenous contrast exposure unless the latter is absolutely indicated.   - Preferred narcotic agents for pain control are hydromorphone, fentanyl, and methadone. Morphine should not be used.  - Avoid Baclofen and avoid oral sodium phosphate and magnesium citrate based laxatives / bowel preps.  - Continue strict Input and Output monitoring. Will monitor the patient closely with you and intervene or adjust therapy as indicated by changes in clinical status/labs     SOB / cough - initially being treated for PNA but abx stopped, procalcitonin <0.10 and WBC 7. H/o recent Sepsis/ UTI - w/ cx growing out [facility]. Patient had been d/c with Keflex. DM2 - per pmd H/o CVA HTN - coreg and norvasc, hydralazine on hold.  Subjective:   Increasing O2 requirements -> BiPAP   Objective:   BP 114/61 (BP Location: Left Arm)   Pulse 72   Temp 98 F (36.7 C) (Oral)   Resp 20   Ht 5'  5" (1.651 m)   SpO2 92%   BMI 26.09 kg/m   Intake/Output Summary (Last 24 hours) at 05/07/2023 1429 Last data filed at 05/07/2023 1401 Gross per 24 hour  Intake 840 ml  Output 800 ml  Net 40 ml   Weight change:   Physical Exam: Gen alert, tachypneic, more dyspneic supine, on BiPAP Chest poor air movement but no rales Tachy Abd soft ntnd no mass or ascites  Ext no LE or UE edema, no wounds or ulcers Neuro is alert, Ox 3 , nf  Imaging: DG Chest Port 1 View  Result Date: 05/06/2023 CLINICAL DATA:   Respiratory complication. EXAM: PORTABLE CHEST 1 VIEW COMPARISON:  05/05/2023. FINDINGS: Heart is enlarged and the mediastinal contour stable. There is atherosclerotic calcification of the aorta. Patchy airspace disease is noted in the middle to lower lung fields bilaterally. Small to moderate pleural effusions are present bilaterally. No pneumothorax. IMPRESSION: 1. Stable patchy airspace disease in the mid to lower lung fields bilaterally, possible edema or infiltrate. 2. Small to moderate pleural effusions bilaterally. Electronically Signed   By: Thornell Sartorius M.D.   On: 05/06/2023 22:12   ECHOCARDIOGRAM COMPLETE  Result Date: 05/06/2023    ECHOCARDIOGRAM REPORT   Patient Name:   Samantha [name] Date of Exam: 05/06/2023 Medical Rec #:  [id number]           Height:       65.0 in Accession #:    [id number]          Weight:       156.8 lb Date of Birth:  Nov 17, 1951           BSA:          1.784 m Patient Age:    71 years            BP:           142/76 mmHg Patient Gender: F                   HR:           53 bpm. Exam Location:  Inpatient Procedure: 2D Echo, Cardiac Doppler and Color Doppler Indications:    CHF - Acute Diastolic  History:        Patient has prior history of Echocardiogram examinations, most                 recent 11/09/2021. Hx of Aortic root aneurysm, Acute respiratory                 failure, PAD and Stroke; Risk Factors:Diabetes, Hypertension and                 Dyslipidemia.  Sonographer:    Wallie Char Referring Phys: Gomez Cleverly LAMA  Sonographer Comments: *Pt had reaction to Definity in the past* IMPRESSIONS  1. Left ventricular ejection fraction, by estimation, is 55 to 60%. The left ventricle has normal function. The left ventricle has no regional wall motion abnormalities. There is mild left ventricular hypertrophy. Left ventricular diastolic parameters are indeterminate.  2. Right ventricular systolic function is normal. The right ventricular size is normal. There is moderately  elevated pulmonary artery systolic pressure. The estimated right ventricular systolic pressure is 46.4 mmHg.  3. The mitral valve is grossly normal. Mild mitral valve regurgitation. No evidence of mitral stenosis.  4. The aortic valve is tricuspid. There is mild calcification of the aortic valve. Aortic valve regurgitation is not visualized. No aortic  stenosis is present.  5. The inferior vena cava is dilated in size with <50% respiratory variability, suggesting right atrial pressure of 15 mmHg.  6. Ascending aorta measurements are within normal limits for age when indexed to body surface area. FINDINGS  Left Ventricle: Left ventricular ejection fraction, by estimation, is 55 to 60%. The left ventricle has normal function. The left ventricle has no regional wall motion abnormalities. The left ventricular internal cavity size was normal in size. There is  mild left ventricular hypertrophy. Left ventricular diastolic parameters are indeterminate. Right Ventricle: The right ventricular size is normal. No increase in right ventricular wall thickness. Right ventricular systolic function is normal. There is moderately elevated pulmonary artery systolic pressure. The tricuspid regurgitant velocity is 2.80 m/s, and with an assumed right atrial pressure of 15 mmHg, the estimated right ventricular systolic pressure is 46.4 mmHg. Left Atrium: Left atrial size was normal in size. Right Atrium: Right atrial size was normal in size. Pericardium: There is no evidence of pericardial effusion. Mitral Valve: The mitral valve is grossly normal. Mild mitral valve regurgitation. No evidence of mitral valve stenosis. MV peak gradient, 9.9 mmHg. The mean mitral valve gradient is 5.0 mmHg with average heart rate of 96 bpm. Tricuspid Valve: The tricuspid valve is normal in structure. Tricuspid valve regurgitation is trivial. No evidence of tricuspid stenosis. Aortic Valve: The aortic valve is tricuspid. There is mild calcification of the  aortic valve. Aortic valve regurgitation is not visualized. No aortic stenosis is present. Aortic valve mean gradient measures 3.5 mmHg. Aortic valve peak gradient measures 5.3 mmHg. Aortic valve area, by VTI measures 2.13 cm. Pulmonic Valve: The pulmonic valve was normal in structure. Pulmonic valve regurgitation is not visualized. No evidence of pulmonic stenosis. Aorta: The aortic root is normal in size and structure. Ascending aorta measurements are within normal limits for age when indexed to body surface area. Venous: The inferior vena cava is dilated in size with less than 50% respiratory variability, suggesting right atrial pressure of 15 mmHg. IAS/Shunts: The interatrial septum was not well visualized.  LEFT VENTRICLE PLAX 2D LVIDd:         5.10 cm     Diastology LVIDs:         3.70 cm     LV e' medial:    7.27 cm/s LV PW:         1.00 cm     LV E/e' medial:  20.4 LV IVS:        1.20 cm     LV e' lateral:   9.98 cm/s LVOT diam:     1.80 cm     LV E/e' lateral: 14.8 LV SV:         51 LV SV Index:   29 LVOT Area:     2.54 cm  LV Volumes (MOD) LV vol d, MOD A2C: 72.6 ml LV vol d, MOD A4C: 98.3 ml LV vol s, MOD A2C: 33.3 ml LV vol s, MOD A4C: 48.4 ml LV SV MOD A2C:     39.3 ml LV SV MOD A4C:     98.3 ml LV SV MOD BP:      43.6 ml RIGHT VENTRICLE             IVC RV Basal diam:  3.90 cm     IVC diam: 2.10 cm RV S prime:     13.10 cm/s TAPSE (M-mode): 2.0 cm LEFT ATRIUM  Index        RIGHT ATRIUM           Index LA diam:        4.00 cm 2.24 cm/m   RA Area:     15.60 cm LA Vol (A2C):   49.5 ml 27.75 ml/m  RA Volume:   38.20 ml  21.42 ml/m LA Vol (A4C):   45.8 ml 25.68 ml/m LA Biplane Vol: 48.2 ml 27.02 ml/m  AORTIC VALVE AV Area (Vmax):    2.17 cm AV Area (Vmean):   2.11 cm AV Area (VTI):     2.13 cm AV Vmax:           115.00 cm/s AV Vmean:          87.200 cm/s AV VTI:            0.240 m AV Peak Grad:      5.3 mmHg AV Mean Grad:      3.5 mmHg LVOT Vmax:         98.05 cm/s LVOT Vmean:         72.300 cm/s LVOT VTI:          0.201 m LVOT/AV VTI ratio: 0.84  AORTA Ao Root diam: 3.50 cm Ao Asc diam:  3.80 cm MITRAL VALVE                  TRICUSPID VALVE MV Area (PHT): 5.31 cm       TR Peak grad:   31.4 mmHg MV Area VTI:   1.62 cm       TR Vmax:        280.00 cm/s MV Peak grad:  9.9 mmHg MV Mean grad:  5.0 mmHg       SHUNTS MV Vmax:       1.57 m/s       Systemic VTI:  0.20 m MV Vmean:      105.0 cm/s     Systemic Diam: 1.80 cm MV Decel Time: 143 msec MR Peak grad:    127.2 mmHg MR Mean grad:    83.0 mmHg MR Vmax:         564.00 cm/s MR Vmean:        424.0 cm/s MR PISA:         4.02 cm MR PISA Eff ROA: 22 mm MR PISA Radius:  0.80 cm MV E velocity: 148.00 cm/s MV A velocity: 121.00 cm/s MV E/A ratio:  1.22 Weston Brass MD Electronically signed by Weston Brass MD Signature Date/Time: 05/06/2023/2:39:59 PM    Final    US RENAL  Result Date: 05/05/2023 CLINICAL DATA:  Chronic kidney disease. EXAM: RENAL / URINARY TRACT ULTRASOUND COMPLETE COMPARISON:  CT of the chest, abdomen, pelvis on 05/05/2023 FINDINGS: Right Kidney: Renal measurements: 10.5 x 4.2 x 5.5 centimeters = volume: 125.1 mL. There is narrowing of the renal cortex. Normal renal echogenicity. No hydronephrosis or suspicious renal mass. Left Kidney: Renal measurements: 9.2 x 4.3 x 3.6 centimeters = volume: 13.8 mL. There is renal parenchymal thinning. Normal renal echogenicity. No hydronephrosis or suspicious renal mass. Bladder: Appears normal for degree of bladder distention. Other: None. IMPRESSION: No hydronephrosis. Renal parenchymal thinning bilaterally. Electronically Signed   By: Norva Pavlov M.D.   On: 05/05/2023 18:49   CT CHEST ABDOMEN PELVIS WO CONTRAST  Result Date: 05/05/2023 CLINICAL DATA:  Sepsis.  Increasing O2 requirements. EXAM: CT CHEST, ABDOMEN AND PELVIS WITHOUT CONTRAST TECHNIQUE: Multidetector CT imaging of the chest, abdomen  and pelvis was performed following the standard protocol without IV contrast. RADIATION  DOSE REDUCTION: This exam was performed according to the departmental dose-optimization program which includes automated exposure control, adjustment of the mA and/or kV according to patient size and/or use of iterative reconstruction technique. COMPARISON:  Chest x-ray earlier 05/05/2023. Ultrasound abdomen 05/05/2023. CT scan 04/14/2023. FINDINGS: CT CHEST FINDINGS Cardiovascular: Heart is nonenlarged. Trace pericardial fluid or thickening. Coronary artery calcifications are seen. The thoracic aorta has a normal course and caliber with scattered vascular calcifications. Bovine type aortic arch. Significant calcified plaque of the left subclavian artery. Mediastinum/Nodes: Small hiatal hernia. Normal caliber thoracic esophagus. Heterogeneous small thyroid gland. No specific abnormal lymph node enlargement seen in the axillary region or hila on this noncontrast exam. There are some prominent less than 1 cm in size in short axis mediastinal nodes, nonpathologic by size criteria. Example right of midline, precarinal measures 8 mm in short axis on series 2, image 23. These nodes are slightly more numerous than usually seen however. Lungs/Pleura: Developing small to moderate bilateral pleural effusions, right-greater-than-left. Adjacent parenchymal opacities in the lower lobes. Consolidation is possible. There are some patchy ground-glass and interstitial changes seen along the upper lung zones with thickening of the interstitial septa. Please correlate for a component of edema. No pneumothorax. Musculoskeletal: Mild degenerative changes along the spine. CT ABDOMEN PELVIS FINDINGS Hepatobiliary: Dilated gallbladder with some dependent stones. This was seen previously. Please correlate with separate ultrasound from 05/05/2023 earlier preserved hepatic parenchyma on this noncontrast examination otherwise. Pancreas: Moderate atrophy of the pancreas.  No obvious mass. Spleen: Splenic granulomas. Adrenals/Urinary Tract: The  adrenal glands are preserved. Mild bilateral renal atrophy with some perinephric stranding. Multiple calcifications along each kidney could be vascular rather than collecting system. No collecting system dilatation. No abnormal calcification seen within either ureter. Bladder is underdistended. There are low-lying pelvic structures. Please correlate for prolapse. Stomach/Bowel: On this non oral contrast exam, the large bowel has a normal course and caliber with scattered colonic stool. The stomach and small bowel are nondilated. Normal appendix in the right lower quadrant extending inferior to the cecum. Vascular/Lymphatic: Diffuse vascular calcifications identified particularly of branch vessels. Normal caliber aorta and IVC. No specific abnormal lymph node enlargement identified in the abdomen and pelvis. Reproductive: Status post hysterectomy. No adnexal masses. Other: Small focal wide mouth hernia seen along the anterior pelvic wall in the right side lateral to the margin of the right rectus abdominis muscle containing a loop of small bowel. No obstruction. This is unchanged from previous. Small fat containing umbilical hernia. Musculoskeletal: Scattered degenerative changes seen of the spine and pelvis. IMPRESSION: New bilateral pleural effusions, right-greater-than-left with the adjacent parenchymal opacities. Infiltrates are possible and recommend follow-up. There is also a component of interstitial septal thickening which is new. A component of edema is possible. No bowel obstruction, free air or free fluid with scattered stool. No obstructing renal stones.  Mild bilateral renal atrophy. Dilated gallbladder with a dependent stone. No adjacent inflammatory changes by noncontrast CT. Please correlate with the prior ultrasound. If there is further concern, HIDA scan may be of some benefit. Significant diffuse vascular calcifications. 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 Please correlate with any particular symptoms. Electronically Signed   By: Karen Kays M.D.   On: 05/05/2023 14:58    Labs: BMET Recent Labs  Lab 05/05/23 0927 05/06/23 0518 05/07/23 0532  NA 133* 133* 133*  K 3.4* 3.8 3.9  CL 102 101 99  CO2 20* 23 25  GLUCOSE 201* 123* 69*  BUN 25* 29* 35*  CREATININE 2.34* 2.63* 2.83*  CALCIUM 8.7* 8.3* 8.4*   CBC Recent Labs  Lab 05/05/23 0927 05/06/23 0518 05/07/23 0532  WBC 7.5 9.2 7.0  HGB 10.6* 9.2* 8.9*  HCT 31.5* 28.2* 27.3*  MCV 92.9 95.3 95.8  PLT 390 341 285    Medications:     carvedilol  25 mg Oral BID WC   clopidogrel  75 mg Oral Daily   diphenhydrAMINE  25 mg Intravenous Once   fluticasone  2 spray Each Nare Daily   guaiFENesin  600 mg Oral BID   heparin  5,000 Units Subcutaneous Q8H   insulin aspart  0-9 Units Subcutaneous TID WC   oxymetazoline  1 spray Each Nare Once   polyethylene glycol  17 g Oral Daily   sodium bicarbonate  650 mg Oral TID   sodium chloride flush  3 mL Intravenous Q12H      Paulene Floor, MD 05/07/2023, 2:29 PM

## 2023-05-07 NOTE — Progress Notes (Signed)
Patient arrived at the unit,CHG bath given,Vitals taken,CCMD notified,pt oriented to the unit,pt placed in HFNC 15L

## 2023-05-07 NOTE — Progress Notes (Signed)
Pt requested to take off BIPAP. RT placed pt on 15L [place] HFNC, Pt is tolerating settings well at this time. BIPAP is at bedside if needed.

## 2023-05-07 NOTE — Progress Notes (Signed)
Current SpO2 low-mid 90's on 15L [place]. Patient declines going back on the BiPAP. Family at bedside agree the patient is comfortable without the use of BiPAP at this time. RN made aware. RT will continue to follow.

## 2023-05-07 NOTE — Consult Note (Signed)
Chief Complaint: Patient was seen in consultation today for  Chief Complaint  Patient presents with   Abdominal Pain   Chest Pain    Referring Physician(s): Dr. Juel Burrow  Supervising Physician: Ruel Favors  Patient Status: [facility] - In-pt  History of Present Illness: Samantha [name] is a 72 y.o. female with a medical history significant for PAD, HTN, DM, CVA and depression/anxiety. She was recently discharged from the hospital 04/18/23 after she was treated for UTI/Pyelonephritis and pulmonary edema. She was discharged on lasix.  The patient presented to the [facility] ED 05/05/23 with complaints of worsening abdominal and chest discomfort. ED work up was notable for pulmonary edema vs bibasilar infiltrates and a BNP of 600. She required oxygen supplementation. Her hospital course has been complicated by acute on chronic renal failure and acute on chronic heart failure with increasing oxygen requirements. Lasix has been ineffective and she is now on BiPap. The patient has a pending transfer to [facility] to initiate hemodialysis.   Interventional Radiology has been asked to evaluate this patient for an image-guided tunneled dialysis catheter placement.   Past Medical History:  Diagnosis Date   Acute pyelonephritis    Bacteremia, escherichia coli 01/12/2015   Chest pain    Critical lower limb ischemia (HCC)    Hyperlipidemia    Hypertension    Left carotid bruit    PAD (peripheral artery disease) (HCC)    Type II diabetes mellitus (HCC)     Past Surgical History:  Procedure Laterality Date   ABDOMINAL ANGIOGRAM  05/16/2015   Procedure: Abdominal Angiogram;  Surgeon: Runell Gess, MD;  Location: [facility] INVASIVE CV LAB;  Service: Cardiovascular;;   ABDOMINAL HYSTERECTOMY  ~ 2000   PERIPHERAL VASCULAR CATHETERIZATION Bilateral 05/16/2015   Procedure: Lower Extremity Angiography;  Surgeon: Runell Gess, MD; renal arteries widely patent, L-SFA 75%, L-pop 95%, L-peroneal 90%; R-SFA  40%, R-pop 50%, R-prox peroneal 90%; directional atherectomy L-SFA, p-pop, drug-eluting balloon angioplasty reducing the stenoses to 0, small linear dissection was not flow limiting       PERIPHERAL VASCULAR CATHETERIZATION Left 05/16/2015   Procedure: Peripheral Vascular Atherectomy;  Surgeon: Runell Gess, MD;  Location: [facility] INVASIVE CV LAB;  Service: Cardiovascular;  Laterality: Left;  sfa    Allergies: Perflutren  Medications: Prior to Admission medications   Medication Sig Start Date End Date Taking? Authorizing Provider  amLODipine (NORVASC) 10 MG tablet Take 1 tablet (10 mg total) by mouth daily. 04/12/23  Yes Hoy Register, MD  atorvastatin (LIPITOR) 80 MG tablet Take 1 tablet (80 mg total) by mouth daily. 04/12/23  Yes Hoy Register, MD  cephALEXin (KEFLEX) 500 MG capsule Take 500 mg by mouth 2 (two) times daily.   Yes [provider]  clopidogrel (PLAVIX) 75 MG tablet Take 1 tablet (75 mg total) by mouth daily. 04/12/23  Yes Hoy Register, MD  diltiazem (CARDIZEM SR) 120 MG 12 hr capsule Take 120 mg by mouth daily.   Yes [provider]  furosemide (LASIX) 40 MG tablet Take 1 tablet (40 mg total) by mouth daily for 7 days. 04/18/23 05/05/23 Yes Burnadette Pop, MD  glimepiride (AMARYL) 4 MG tablet Take 1 tablet (4 mg total) by mouth daily with breakfast. 04/12/23  Yes Newlin, Enobong, MD  hydrALAZINE (APRESOLINE) 50 MG tablet Take 1 tablet (50 mg total) by mouth every 8 (eight) hours. 04/18/23  Yes Burnadette Pop, MD  sodium bicarbonate 650 MG tablet Take 1 tablet (650 mg total) by mouth 3 (  three) times daily. 04/18/23  Yes Burnadette Pop, MD  glucose blood test strip Use 3 times daily before meals 07/21/18   Hoy Register, MD  insulin isophane & regular human KwikPen (HUMULIN 70/30 KWIKPEN) (70-30) 100 UNIT/ML KwikPen Inject 30 Units into the skin in the morning AND 25 Units every evening. Patient taking differently: Inject 30 Units into the skin in the morning  AND 26 Units every evening. 04/12/23   Hoy Register, MD  Insulin Syringe-Needle U-100 (BD INSULIN SYRINGE ULTRAFINE) 31G X 15/64" 0.5 ML MISC Use subcutaneously twice daily 01/28/17   Hoy Register, MD  TRUEPLUS LANCETS 28G MISC 1 each by Does not apply route 3 (three) times daily before meals. 12/23/16   Hoy Register, MD     Family History  Problem Relation Age of Onset   Hypertension Mother     Social History   Socioeconomic History   Marital status: Widowed    Spouse name: Not on file   Number of children: Not on file   Years of education: Not on file   Highest education level: Not on file  Occupational History   Not on file  Tobacco Use   Smoking status: Never   Smokeless tobacco: Never  Substance and Sexual Activity   Alcohol use: Yes    Alcohol/week: 1.0 - 2.0 standard drink of alcohol    Types: 1 - 2 Cans of beer per week    Comment: occas   Drug use: No   Sexual activity: Never  Other Topics Concern   Not on file  Social History Narrative   Not on file   Social Determinants of Health   Financial Resource Strain: Not on file  Food Insecurity: No Food Insecurity (05/05/2023)   Hunger Vital Sign    Worried About Running Out of Food in the Last Year: Never true    Ran Out of Food in the Last Year: Never true  Transportation Needs: No Transportation Needs (05/05/2023)   PRAPARE - Administrator, Civil Service (Medical): No    Lack of Transportation (Non-Medical): No  Physical Activity: Not on file  Stress: Not on file  Social Connections: Not on file    Review of Systems: A 12 point ROS discussed and pertinent positives are indicated in the HPI above.  All other systems are negative.  Review of Systems  Respiratory:  Positive for shortness of breath.   Cardiovascular:  Negative for leg swelling.  Gastrointestinal:  Negative for abdominal pain, diarrhea, nausea and vomiting.  Neurological:  Negative for dizziness and headaches.    Vital  Signs: BP 114/61 (BP Location: Left Arm)   Pulse 72   Temp 98 F (36.7 C) (Oral)   Resp (!) 22   Ht 5\' 5"  (1.651 m)   SpO2 93%   BMI 26.09 kg/m   Physical Exam Constitutional:      General: She is not in acute distress.    Appearance: She is not ill-appearing.  Cardiovascular:     Rate and Rhythm: Normal rate and regular rhythm.     Pulses: Normal pulses.  Pulmonary:     Comments: bipap Abdominal:     Palpations: Abdomen is soft.     Tenderness: There is no abdominal tenderness.  Musculoskeletal:     Right lower leg: No edema.     Left lower leg: No edema.  Skin:    General: Skin is warm and dry.  Neurological:     Mental Status: She is alert  and oriented to person, place, and time.     Imaging: DG Chest Port 1 View  Result Date: 05/06/2023 CLINICAL DATA:  Respiratory complication. EXAM: PORTABLE CHEST 1 VIEW COMPARISON:  05/05/2023. FINDINGS: Heart is enlarged and the mediastinal contour stable. There is atherosclerotic calcification of the aorta. Patchy airspace disease is noted in the middle to lower lung fields bilaterally. Small to moderate pleural effusions are present bilaterally. No pneumothorax. IMPRESSION: 1. Stable patchy airspace disease in the mid to lower lung fields bilaterally, possible edema or infiltrate. 2. Small to moderate pleural effusions bilaterally. Electronically Signed   By: Thornell Sartorius M.D.   On: 05/06/2023 22:12   ECHOCARDIOGRAM COMPLETE  Result Date: 05/06/2023    ECHOCARDIOGRAM REPORT   Patient Name:   Samantha [name] Date of Exam: 05/06/2023 Medical Rec #:  [id number]           Height:       65.0 in Accession #:    [id number]          Weight:       156.8 lb Date of Birth:  1951-02-08           BSA:          1.784 m Patient Age:    71 years            BP:           142/76 mmHg Patient Gender: F                   HR:           53 bpm. Exam Location:  Inpatient Procedure: 2D Echo, Cardiac Doppler and Color Doppler Indications:    CHF - Acute  Diastolic  History:        Patient has prior history of Echocardiogram examinations, most                 recent 11/09/2021. Hx of Aortic root aneurysm, Acute respiratory                 failure, PAD and Stroke; Risk Factors:Diabetes, Hypertension and                 Dyslipidemia.  Sonographer:    Wallie Char Referring Phys: Gomez Cleverly LAMA  Sonographer Comments: *Pt had reaction to Definity in the past* IMPRESSIONS  1. Left ventricular ejection fraction, by estimation, is 55 to 60%. The left ventricle has normal function. The left ventricle has no regional wall motion abnormalities. There is mild left ventricular hypertrophy. Left ventricular diastolic parameters are indeterminate.  2. Right ventricular systolic function is normal. The right ventricular size is normal. There is moderately elevated pulmonary artery systolic pressure. The estimated right ventricular systolic pressure is 46.4 mmHg.  3. The mitral valve is grossly normal. Mild mitral valve regurgitation. No evidence of mitral stenosis.  4. The aortic valve is tricuspid. There is mild calcification of the aortic valve. Aortic valve regurgitation is not visualized. No aortic stenosis is present.  5. The inferior vena cava is dilated in size with <50% respiratory variability, suggesting right atrial pressure of 15 mmHg.  6. Ascending aorta measurements are within normal limits for age when indexed to body surface area. FINDINGS  Left Ventricle: Left ventricular ejection fraction, by estimation, is 55 to 60%. The left ventricle has normal function. The left ventricle has no regional wall motion abnormalities. The left ventricular internal cavity size was normal in size. There is  mild  left ventricular hypertrophy. Left ventricular diastolic parameters are indeterminate. Right Ventricle: The right ventricular size is normal. No increase in right ventricular wall thickness. Right ventricular systolic function is normal. There is moderately elevated  pulmonary artery systolic pressure. The tricuspid regurgitant velocity is 2.80 m/s, and with an assumed right atrial pressure of 15 mmHg, the estimated right ventricular systolic pressure is 46.4 mmHg. Left Atrium: Left atrial size was normal in size. Right Atrium: Right atrial size was normal in size. Pericardium: There is no evidence of pericardial effusion. Mitral Valve: The mitral valve is grossly normal. Mild mitral valve regurgitation. No evidence of mitral valve stenosis. MV peak gradient, 9.9 mmHg. The mean mitral valve gradient is 5.0 mmHg with average heart rate of 96 bpm. Tricuspid Valve: The tricuspid valve is normal in structure. Tricuspid valve regurgitation is trivial. No evidence of tricuspid stenosis. Aortic Valve: The aortic valve is tricuspid. There is mild calcification of the aortic valve. Aortic valve regurgitation is not visualized. No aortic stenosis is present. Aortic valve mean gradient measures 3.5 mmHg. Aortic valve peak gradient measures 5.3 mmHg. Aortic valve area, by VTI measures 2.13 cm. Pulmonic Valve: The pulmonic valve was normal in structure. Pulmonic valve regurgitation is not visualized. No evidence of pulmonic stenosis. Aorta: The aortic root is normal in size and structure. Ascending aorta measurements are within normal limits for age when indexed to body surface area. Venous: The inferior vena cava is dilated in size with less than 50% respiratory variability, suggesting right atrial pressure of 15 mmHg. IAS/Shunts: The interatrial septum was not well visualized.  LEFT VENTRICLE PLAX 2D LVIDd:         5.10 cm     Diastology LVIDs:         3.70 cm     LV e' medial:    7.27 cm/s LV PW:         1.00 cm     LV E/e' medial:  20.4 LV IVS:        1.20 cm     LV e' lateral:   9.98 cm/s LVOT diam:     1.80 cm     LV E/e' lateral: 14.8 LV SV:         51 LV SV Index:   29 LVOT Area:     2.54 cm  LV Volumes (MOD) LV vol d, MOD A2C: 72.6 ml LV vol d, MOD A4C: 98.3 ml LV vol s, MOD A2C:  33.3 ml LV vol s, MOD A4C: 48.4 ml LV SV MOD A2C:     39.3 ml LV SV MOD A4C:     98.3 ml LV SV MOD BP:      43.6 ml RIGHT VENTRICLE             IVC RV Basal diam:  3.90 cm     IVC diam: 2.10 cm RV S prime:     13.10 cm/s TAPSE (M-mode): 2.0 cm LEFT ATRIUM             Index        RIGHT ATRIUM           Index LA diam:        4.00 cm 2.24 cm/m   RA Area:     15.60 cm LA Vol (A2C):   49.5 ml 27.75 ml/m  RA Volume:   38.20 ml  21.42 ml/m LA Vol (A4C):   45.8 ml 25.68 ml/m LA Biplane Vol: 48.2 ml 27.02 ml/m  AORTIC  VALVE AV Area (Vmax):    2.17 cm AV Area (Vmean):   2.11 cm AV Area (VTI):     2.13 cm AV Vmax:           115.00 cm/s AV Vmean:          87.200 cm/s AV VTI:            0.240 m AV Peak Grad:      5.3 mmHg AV Mean Grad:      3.5 mmHg LVOT Vmax:         98.05 cm/s LVOT Vmean:        72.300 cm/s LVOT VTI:          0.201 m LVOT/AV VTI ratio: 0.84  AORTA Ao Root diam: 3.50 cm Ao Asc diam:  3.80 cm MITRAL VALVE                  TRICUSPID VALVE MV Area (PHT): 5.31 cm       TR Peak grad:   31.4 mmHg MV Area VTI:   1.62 cm       TR Vmax:        280.00 cm/s MV Peak grad:  9.9 mmHg MV Mean grad:  5.0 mmHg       SHUNTS MV Vmax:       1.57 m/s       Systemic VTI:  0.20 m MV Vmean:      105.0 cm/s     Systemic Diam: 1.80 cm MV Decel Time: 143 msec MR Peak grad:    127.2 mmHg MR Mean grad:    83.0 mmHg MR Vmax:         564.00 cm/s MR Vmean:        424.0 cm/s MR PISA:         4.02 cm MR PISA Eff ROA: 22 mm MR PISA Radius:  0.80 cm MV E velocity: 148.00 cm/s MV A velocity: 121.00 cm/s MV E/A ratio:  1.22 Weston Brass MD Electronically signed by Weston Brass MD Signature Date/Time: 05/06/2023/2:39:59 PM    Final    US RENAL  Result Date: 05/05/2023 CLINICAL DATA:  Chronic kidney disease. EXAM: RENAL / URINARY TRACT ULTRASOUND COMPLETE COMPARISON:  CT of the chest, abdomen, pelvis on 05/05/2023 FINDINGS: Right Kidney: Renal measurements: 10.5 x 4.2 x 5.5 centimeters = volume: 125.1 mL. There is narrowing of  the renal cortex. Normal renal echogenicity. No hydronephrosis or suspicious renal mass. Left Kidney: Renal measurements: 9.2 x 4.3 x 3.6 centimeters = volume: 13.8 mL. There is renal parenchymal thinning. Normal renal echogenicity. No hydronephrosis or suspicious renal mass. Bladder: Appears normal for degree of bladder distention. Other: None. IMPRESSION: No hydronephrosis. Renal parenchymal thinning bilaterally. Electronically Signed   By: Norva Pavlov M.D.   On: 05/05/2023 18:49   CT CHEST ABDOMEN PELVIS WO CONTRAST  Result Date: 05/05/2023 CLINICAL DATA:  Sepsis.  Increasing O2 requirements. EXAM: CT CHEST, ABDOMEN AND PELVIS WITHOUT CONTRAST TECHNIQUE: Multidetector CT imaging of the chest, abdomen and pelvis was performed following the standard protocol without IV contrast. RADIATION DOSE REDUCTION: This exam was performed according to the departmental dose-optimization program which includes automated exposure control, adjustment of the mA and/or kV according to patient size and/or use of iterative reconstruction technique. COMPARISON:  Chest x-ray earlier 05/05/2023. Ultrasound abdomen 05/05/2023. CT scan 04/14/2023. FINDINGS: CT CHEST FINDINGS Cardiovascular: Heart is nonenlarged. Trace pericardial fluid or thickening. Coronary artery calcifications are seen. The thoracic aorta has a normal  course and caliber with scattered vascular calcifications. Bovine type aortic arch. Significant calcified plaque of the left subclavian artery. Mediastinum/Nodes: Small hiatal hernia. Normal caliber thoracic esophagus. Heterogeneous small thyroid gland. No specific abnormal lymph node enlargement seen in the axillary region or hila on this noncontrast exam. There are some prominent less than 1 cm in size in short axis mediastinal nodes, nonpathologic by size criteria. Example right of midline, precarinal measures 8 mm in short axis on series 2, image 23. These nodes are slightly more numerous than usually seen  however. Lungs/Pleura: Developing small to moderate bilateral pleural effusions, right-greater-than-left. Adjacent parenchymal opacities in the lower lobes. Consolidation is possible. There are some patchy ground-glass and interstitial changes seen along the upper lung zones with thickening of the interstitial septa. Please correlate for a component of edema. No pneumothorax. Musculoskeletal: Mild degenerative changes along the spine. CT ABDOMEN PELVIS FINDINGS Hepatobiliary: Dilated gallbladder with some dependent stones. This was seen previously. Please correlate with separate ultrasound from 05/05/2023 earlier preserved hepatic parenchyma on this noncontrast examination otherwise. Pancreas: Moderate atrophy of the pancreas.  No obvious mass. Spleen: Splenic granulomas. Adrenals/Urinary Tract: The adrenal glands are preserved. Mild bilateral renal atrophy with some perinephric stranding. Multiple calcifications along each kidney could be vascular rather than collecting system. No collecting system dilatation. No abnormal calcification seen within either ureter. Bladder is underdistended. There are low-lying pelvic structures. Please correlate for prolapse. Stomach/Bowel: On this non oral contrast exam, the large bowel has a normal course and caliber with scattered colonic stool. The stomach and small bowel are nondilated. Normal appendix in the right lower quadrant extending inferior to the cecum. Vascular/Lymphatic: Diffuse vascular calcifications identified particularly of branch vessels. Normal caliber aorta and IVC. No specific abnormal lymph node enlargement identified in the abdomen and pelvis. Reproductive: Status post hysterectomy. No adnexal masses. Other: Small focal wide mouth hernia seen along the anterior pelvic wall in the right side lateral to the margin of the right rectus abdominis muscle containing a loop of small bowel. No obstruction. This is unchanged from previous. Small fat containing  umbilical hernia. Musculoskeletal: Scattered degenerative changes seen of the spine and pelvis. IMPRESSION: New bilateral pleural effusions, right-greater-than-left with the adjacent parenchymal opacities. Infiltrates are possible and recommend follow-up. There is also a component of interstitial septal thickening which is new. A component of edema is possible. No bowel obstruction, free air or free fluid with scattered stool. No obstructing renal stones.  Mild bilateral renal atrophy. Dilated gallbladder with a dependent stone. No adjacent inflammatory changes by noncontrast CT. Please correlate with the prior ultrasound. If there is further concern, HIDA scan may be of some benefit. Significant diffuse vascular calcifications. 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 Please correlate with any particular symptoms. Electronically Signed   By: Karen Kays M.D.   On: 05/05/2023 14:58   US Abdomen Limited RUQ (LIVER/GB)  Result Date: 05/05/2023 CLINICAL DATA:  Right upper quadrant pain for several weeks. EXAM: ULTRASOUND ABDOMEN LIMITED RIGHT UPPER QUADRANT COMPARISON:  04/14/2023 FINDINGS: Gallbladder: Gallbladder appears distended, similar to CT from 04/14/2023. Multiple stones are identified measuring up to 8 mm. No gallbladder wall thickening, pericholecystic fluid, or sludge. Negative sonographic Murphy's sign (note: patient received pain medication prior to ultrasound). Common bile duct: Diameter: 5.6 mm Liver: No focal lesion identified. Mild increased parenchymal echogenicity. Portal vein is patent on color Doppler imaging with normal direction of blood flow towards the liver. Other: None. IMPRESSION: 1. Gallstones.  No gallbladder wall thickening, pericholecystic fluid, or sludge. Negative sonographic Murphy's sign (note: patient received pain medication prior to ultrasound). 2. Mild increased hepatic parenchymal echogenicity suggestive of steatosis.  Electronically Signed   By: Signa Kell M.D.   On: 05/05/2023 11:56   DG Chest Port 1 View  Result Date: 05/05/2023 CLINICAL DATA:  Generalized chest pain, shortness of breath, and cough EXAM: PORTABLE CHEST 1 VIEW COMPARISON:  Chest radiograph dated 04/17/2023 FINDINGS: Low lung volumes with bronchovascular crowding. Bilateral perihilar interstitial opacities and bibasilar patchy opacities. Increased moderate bilateral pleural effusions. No pneumothorax. The heart size and mediastinal contours are within normal limits. No acute osseous abnormality. IMPRESSION: 1. Pulmonary edema with increased moderate bilateral pleural effusions. 2. Bibasilar patchy opacities, likely atelectasis. Aspiration or pneumonia can be considered in the appropriate clinical setting. Electronically Signed   By: Agustin Cree M.D.   On: 05/05/2023 11:38   DG CHEST PORT 1 VIEW  Result Date: 04/17/2023 CLINICAL DATA:  Cough. EXAM: PORTABLE CHEST 1 VIEW COMPARISON:  Chest x-ray 04/16/2023 FINDINGS: The cardiac silhouette, mediastinal and hilar contours are within normal limits given the AP projection and portable technique. Stable tortuosity and calcification of the thoracic aorta. Peribronchial thickening and increased interstitial markings could suggest interstitial pulmonary edema or bronchitis. No pleural effusions or focal airspace consolidation. No pneumothorax. IMPRESSION: Peribronchial thickening and increased interstitial markings could suggest interstitial pulmonary edema or bronchitis. Electronically Signed   By: Rudie Meyer M.D.   On: 04/17/2023 10:32   DG CHEST PORT 1 VIEW  Result Date: 04/16/2023 CLINICAL DATA:  Cough. EXAM: PORTABLE CHEST 1 VIEW COMPARISON:  04/14/2023 FINDINGS: Lungs are somewhat hypoinflated demonstrate hazy prominence of the central pulmonary vessels likely representing mild vascular congestion. Slight increased density in the right infrahilar region could be due to superimposed airspace process.  No definite effusion. No pneumothorax. Mild stable cardiomegaly. Remainder of the exam is unchanged. IMPRESSION: 1. Slight increased density in the right infrahilar region which could be due to superimposed airspace process such as atelectasis or infection. Consider PA and lateral chest radiograph for better evaluation. 2. Mild stable cardiomegaly with suggestion of mild vascular congestion. Electronically Signed   By: Elberta Fortis M.D.   On: 04/16/2023 10:41   CT CHEST ABDOMEN PELVIS WO CONTRAST  Result Date: 04/14/2023 CLINICAL DATA:  Abnormal chest x-ray, and left lower quadrant abdominal pain. EXAM: CT CHEST, ABDOMEN AND PELVIS WITHOUT CONTRAST TECHNIQUE: Multidetector CT imaging of the chest, abdomen and pelvis was performed following the standard protocol without IV contrast. RADIATION DOSE REDUCTION: This exam was performed according to the departmental dose-optimization program which includes automated exposure control, adjustment of the mA and/or kV according to patient size and/or use of iterative reconstruction technique. COMPARISON:  Portable chest today, portable chest 01/10/2015. No prior body CT for comparison. FINDINGS: CT CHEST FINDINGS Cardiovascular: The heart is upper limit of normal in size. There is no pericardial effusion. There are calcifications in the left main, lad and circumflex coronary arteries. The pulmonary arteries and veins are normal caliber. There are moderate patchy calcifications in the arch and descending aorta and proximal left subclavian artery, and a two-vessel aortic arch with normal variant brachiobicarotid trunk. No aortic aneurysm is seen, with mild tortuosity in the descending segment. There is suspected at least 60-70% calcific stenosis in the proximal 1 cm of the left subclavian artery. Mediastinum/Nodes: Thyroid gland, axillary spaces are unremarkable. There are slightly prominent right of midline precarinal, and subcarinal lymph nodes up to 1 cm  in short axis,  similar sized single right mid hilar lymph node, but there is no further adenopathy. There is a small hiatal hernia with unremarkable thoracic esophagus. No abnormality is seen within the thoracic trachea and main bronchi apart from mucoid septations. There are also tracheobronchial chondral calcifications. Lungs/Pleura: There is diffuse bronchial thickening. No bronchial plugging is seen. There is subpleural reticulation in the mid to lower lung fields, without honeycombing, findings consistent with mild interstitial lung disease. There are mild atelectatic changes of the posterior lungs, without evidence of focal pneumonia. No lung nodules are seen. No pleural effusion, thickening or pneumothorax. Musculoskeletal: There are degenerative changes of the spine and shoulders. No acute or significant osseous findings or chest wall mass. CT ABDOMEN PELVIS FINDINGS Hepatobiliary: There are vascular calcifications at the hepatic hilum and a few tiny calcified granulomas in the liver. The liver is otherwise unremarkable. There is mild dilatation of the gallbladder and a few small stones in the proximal lumen, but no appreciable wall thickening, pericholecystic edema or biliary dilatation. Pancreas: There is mild glandular atrophy. No focal mass is seen without contrast. There are either scattered vascular calcifications in the parenchyma or calcifications of chronic pancreatitis, but no acute inflammation is evident. Spleen: There are calcified granulomas. Patchy heavy calcification splenic artery. No splenomegaly or focal mass is seen without contrast. Adrenals/Urinary Tract: There is no adrenal mass. There is no focal abnormality of the unenhanced kidneys. Perinephric stranding symmetric and likely on a senescent basis. There are renovascular calcifications extending along both renal hila. No collecting system stones are seen and no hydronephrosis or ureteral stones. The bladder is contracted and not well seen but no  adjacent inflammatory reaction or intravesical stones are noted. Stomach/Bowel: No dilatation or wall thickening including the appendix. There is sigmoid diverticulosis but no findings of acute diverticulitis. Mild fecal stasis. Vascular/Lymphatic: There is heavy aortoiliac and visceral branch vessel atherosclerosis. No AAA. No abdominopelvic adenopathy. Reproductive: Status post hysterectomy. No adnexal masses. Other: There is a small umbilical fat hernia. There is no incarcerated hernia. There is no free fluid, free hemorrhage or free air. Musculoskeletal: Mild osteopenia and degenerative change lumbar spine, spurring of the SI joints, and bridging osteophytes over the left inferior SI joint. No acute or other significant osseous findings. IMPRESSION: 1. Bronchitis without evidence of pneumonia. 2. Subpleural reticulation in the mid to lower lung fields consistent with mild interstitial lung disease. 3. Aortic and coronary artery atherosclerosis. Visceral branch vessel atherosclerosis in the abdomen. 4. 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 5. Small hiatal hernia. 6. Cholelithiasis with mild gallbladder dilatation but no wall thickening or pericholecystic edema. 7. Scattered tiny calcifications in the pancreas, could be vascular or due to chronic calcific pancreatitis. No acute peripancreatic edema. 8. Constipation and diverticulosis. No bowel obstruction or inflammation. 9. Umbilical fat hernia. 10. Osteopenia and degenerative change. Electronically Signed   By: Almira Bar M.D.   On: 04/14/2023 20:28   CT Head Wo Contrast  Result Date: 04/14/2023 CLINICAL DATA:  Delirium. EXAM: CT HEAD WITHOUT CONTRAST CT CERVICAL SPINE WITHOUT CONTRAST TECHNIQUE: Multidetector CT imaging of the head and cervical spine was performed following the standard protocol without intravenous contrast. Multiplanar CT image reconstructions of the cervical spine were also generated. RADIATION  DOSE REDUCTION: This exam was performed according to the departmental dose-optimization program which includes automated exposure control, adjustment of the mA and/or kV according to patient size and/or use of iterative reconstruction technique. COMPARISON:  Head CT  dated 11/08/2021. FINDINGS: CT HEAD FINDINGS Brain: The ventricles and sulci appropriate size for patient's age. Small area of infarct in the left cerebellar hemisphere, small left cerebellar old infarct. Subcentimeter right basal ganglia hypodense focus, likely a small old lacunar infarct. There is no acute intracranial hemorrhage. No mass effect or midline shift. No extra-axial fluid collection. Vascular: No hyperdense vessel or unexpected calcification. Skull: Normal. Negative for fracture or focal lesion. Sinuses/Orbits: Diffuse mucoperiosteal thickening of paranasal sinuses with partial opacification of maxillary sinuses. The mastoid air cells are clear. Other: None CT CERVICAL SPINE FINDINGS Alignment: No acute subluxation. Skull base and vertebrae: No acute fracture. Soft tissues and spinal canal: No prevertebral fluid or swelling. No visible canal hematoma. Disc levels:  No acute findings.  Degenerative changes. Upper chest: Negative. Other: Bilateral carotid bulb calcified plaques. IMPRESSION: 1. No acute intracranial pathology. 2. 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 3. No acute/traumatic cervical spine pathology. 4. Paranasal sinus disease. Electronically Signed   By: Elgie Collard M.D.   On: 04/14/2023 20:04   CT CERVICAL SPINE WO CONTRAST  Result Date: 04/14/2023 CLINICAL DATA:  Delirium. EXAM: CT HEAD WITHOUT CONTRAST CT CERVICAL SPINE WITHOUT CONTRAST TECHNIQUE: Multidetector CT imaging of the head and cervical spine was performed following the standard protocol without intravenous contrast. Multiplanar CT image reconstructions of the cervical spine were also generated. RADIATION DOSE REDUCTION: This exam was  performed according to the departmental dose-optimization program which includes automated exposure control, adjustment of the mA and/or kV according to patient size and/or use of iterative reconstruction technique. COMPARISON:  Head CT dated 11/08/2021. FINDINGS: CT HEAD FINDINGS Brain: The ventricles and sulci appropriate size for patient's age. Small area of infarct in the left cerebellar hemisphere, small left cerebellar old infarct. Subcentimeter right basal ganglia hypodense focus, likely a small old lacunar infarct. There is no acute intracranial hemorrhage. No mass effect or midline shift. No extra-axial fluid collection. Vascular: No hyperdense vessel or unexpected calcification. Skull: Normal. Negative for fracture or focal lesion. Sinuses/Orbits: Diffuse mucoperiosteal thickening of paranasal sinuses with partial opacification of maxillary sinuses. The mastoid air cells are clear. Other: None CT CERVICAL SPINE FINDINGS Alignment: No acute subluxation. Skull base and vertebrae: No acute fracture. Soft tissues and spinal canal: No prevertebral fluid or swelling. No visible canal hematoma. Disc levels:  No acute findings.  Degenerative changes. Upper chest: Negative. Other: Bilateral carotid bulb calcified plaques. IMPRESSION: 1. No acute intracranial pathology. 2. 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 3. No acute/traumatic cervical spine pathology. 4. Paranasal sinus disease. Electronically Signed   By: Elgie Collard M.D.   On: 04/14/2023 20:04   DG Chest Portable 1 View  Result Date: 04/14/2023 CLINICAL DATA:  Chest pain. EXAM: PORTABLE CHEST 1 VIEW COMPARISON:  Chest x-ray 01/10/2015. FINDINGS: Low lung volumes. Streaky left basilar opacities. No visible pleural effusions or pneumothorax. No acute osseous abnormality. Cardiac silhouette is accentuated by technique and enlarged. IMPRESSION: Low lung volumes with streaky left basilar opacities, most likely atelectasis. Dedicated PA  and lateral radiographs could better assess if clinically warranted. Electronically Signed   By: Feliberto Harts M.D.   On: 04/14/2023 18:23    Labs:  CBC: Recent Labs    04/16/23 0505 05/05/23 0927 05/06/23 0518 05/07/23 0532  WBC 9.9 7.5 9.2 7.0  HGB 10.7* 10.6* 9.2* 8.9*  HCT 32.4* 31.5* 28.2* 27.3*  PLT 260 390 341 285    COAGS: No results for input(s): "INR", "APTT" in the last  8760 hours.  BMP: Recent Labs    04/18/23 0808 05/05/23 0927 05/06/23 0518 05/07/23 0532  NA 134* 133* 133* 133*  K 3.5 3.4* 3.8 3.9  CL 102 102 101 99  CO2 22 20* 23 25  GLUCOSE 169* 201* 123* 69*  BUN 34* 25* 29* 35*  CALCIUM 8.4* 8.7* 8.3* 8.4*  CREATININE 2.83* 2.34* 2.63* 2.83*  GFRNONAA 18* 22* 19* 18*    LIVER FUNCTION TESTS: Recent Labs    04/14/23 1715 05/05/23 0927 05/06/23 0518 05/07/23 0532  BILITOT 0.7 0.8 0.5 0.7  AST 18 21 21 27   ALT 11 17 17 22   ALKPHOS 62 67 61 63  PROT 6.9 7.0 6.5 6.4*  ALBUMIN 3.4* 3.6 3.3* 3.1*    TUMOR MARKERS: No results for input(s): "AFPTM", "CEA", "CA199", "CHROMGRNA" in the last 8760 hours.  Assessment and Plan:  Acute on chronic kidney disease with acute on chronic heart failure and volume overload; pending hemodialysis: Samantha [name], 72 year old female, is tentatively scheduled 05/07/23 for an image-guided tunneled dialysis catheter placement. She has a pending transfer to [facility]. IR will plan to perform procedure at [facility] tomorrow. The procedure was discussed over the phone with the patient's daughter Debarah Crape and telephone consent was obtained. The procedure was also discussed with the patient at the bedside with the assistance of a Spanish-speaking video interpreter. The patient gave verbal consent.   Risks and benefits discussed with the patient including, but not limited to bleeding, infection, vascular injury, pneumothorax which may require chest tube placement, air embolism or even death  All  of the patient's questions were answered, patient is agreeable to proceed. She will be NPO at midnight.   Consent signed and will be in the [facility] APP office.   Thank you for this interesting consult.  I greatly enjoyed meeting Samantha [name] and look forward to participating in their care.  A copy of this report was sent to the requesting provider on this date.  Electronically Signed: Alwyn Ren, AGACNP-BC [phone] 05/07/2023, 2:53 PM   I spent a total of 20 Minutes    in face to face in clinical consultation, greater than 50% of which was counseling/coordinating care for tunneled hemodialysis catheter

## 2023-05-08 ENCOUNTER — Inpatient Hospital Stay (HOSPITAL_COMMUNITY): Payer: Medicaid Other

## 2023-05-08 LAB — RENAL FUNCTION PANEL
Albumin: 3 g/dL — ABNORMAL LOW (ref 3.5–5.0)
Anion gap: 10 (ref 5–15)
BUN: 35 mg/dL — ABNORMAL HIGH (ref 8–23)
CO2: 26 mmol/L (ref 22–32)
Calcium: 8.5 mg/dL — ABNORMAL LOW (ref 8.9–10.3)
Chloride: 98 mmol/L (ref 98–111)
Creatinine, Ser: 2.9 mg/dL — ABNORMAL HIGH (ref 0.44–1.00)
GFR, Estimated: 17 mL/min — ABNORMAL LOW (ref >60)
Glucose, Bld: 118 mg/dL — ABNORMAL HIGH (ref 70–99)
Phosphorus: 5.3 mg/dL — ABNORMAL HIGH (ref 2.5–4.6)
Potassium: 3.9 mmol/L (ref 3.5–5.1)
Sodium: 134 mmol/L — ABNORMAL LOW (ref 135–145)

## 2023-05-08 LAB — PROTEIN, PLEURAL OR PERITONEAL FLUID: Total protein, fluid: <3 g/dL

## 2023-05-08 LAB — GLUCOSE, CAPILLARY
Glucose-Capillary: 115 mg/dL — ABNORMAL HIGH (ref 70–99)
Glucose-Capillary: 157 mg/dL — ABNORMAL HIGH (ref 70–99)
Glucose-Capillary: 128 mg/dL — ABNORMAL HIGH (ref 70–99)

## 2023-05-08 LAB — BASIC METABOLIC PANEL
Anion gap: 11 (ref 5–15)
BUN: 37 mg/dL — ABNORMAL HIGH (ref 8–23)
CO2: 26 mmol/L (ref 22–32)
Calcium: 8.7 mg/dL — ABNORMAL LOW (ref 8.9–10.3)
Chloride: 97 mmol/L — ABNORMAL LOW (ref 98–111)
Creatinine, Ser: 2.99 mg/dL — ABNORMAL HIGH (ref 0.44–1.00)
GFR, Estimated: 16 mL/min — ABNORMAL LOW (ref >60)
Glucose, Bld: 136 mg/dL — ABNORMAL HIGH (ref 70–99)
Potassium: 3.5 mmol/L (ref 3.5–5.1)
Sodium: 134 mmol/L — ABNORMAL LOW (ref 135–145)

## 2023-05-08 LAB — BODY FLUID CELL COUNT WITH DIFFERENTIAL
Eos, Fluid: 0 %
Lymphs, Fluid: 63 %
Monocyte-Macrophage-Serous Fluid: 17 % — ABNORMAL LOW (ref 50–90)
Neutrophil Count, Fluid: 20 % (ref 0–25)
Total Nucleated Cell Count, Fluid: 128 cu mm (ref 0–1000)

## 2023-05-08 LAB — GLUCOSE, PLEURAL OR PERITONEAL FLUID: Glucose, Fluid: 149 mg/dL

## 2023-05-08 LAB — LACTATE DEHYDROGENASE, PLEURAL OR PERITONEAL FLUID: LD, Fluid: 52 U/L — ABNORMAL HIGH (ref 3–23)

## 2023-05-08 LAB — ANA W/REFLEX IF POSITIVE: Anti Nuclear Antibody (ANA): NEGATIVE

## 2023-05-08 MED ORDER — ATORVASTATIN CALCIUM 80 MG PO TABS
80.0000 mg | ORAL_TABLET | Freq: Every day | ORAL | Status: DC
  Administered 2023-05-08 – 2023-05-28 (×19): 80 mg via ORAL
  Filled 2023-05-08 (×20): qty 1

## 2023-05-08 MED ORDER — CHLORHEXIDINE GLUCONATE CLOTH 2 % EX PADS
6.0000 | MEDICATED_PAD | Freq: Every day | CUTANEOUS | Status: DC
  Administered 2023-05-09 – 2023-05-20 (×7): 6 via TOPICAL

## 2023-05-08 MED ORDER — METOLAZONE 5 MG PO TABS
5.0000 mg | ORAL_TABLET | Freq: Once | ORAL | Status: AC
  Administered 2023-05-08: 5 mg via ORAL
  Filled 2023-05-08: qty 1

## 2023-05-08 MED ORDER — HEPARIN SODIUM (PORCINE) 1000 UNIT/ML DIALYSIS
1000.0000 [IU] | INTRAMUSCULAR | Status: DC | PRN
  Administered 2023-05-17: 1000 [IU] via INTRAVENOUS_CENTRAL
  Filled 2023-05-08: qty 6

## 2023-05-08 MED ORDER — OXYCODONE HCL 5 MG PO TABS
5.0000 mg | ORAL_TABLET | Freq: Once | ORAL | Status: AC
  Administered 2023-05-08: 5 mg via ORAL
  Filled 2023-05-08: qty 1

## 2023-05-08 MED ORDER — POLYETHYLENE GLYCOL 3350 17 G PO PACK
17.0000 g | PACK | Freq: Three times a day (TID) | ORAL | Status: AC
  Administered 2023-05-08 – 2023-05-09 (×3): 17 g via ORAL
  Filled 2023-05-08 (×5): qty 1

## 2023-05-08 MED ORDER — FUROSEMIDE 10 MG/ML IJ SOLN
160.0000 mg | Freq: Two times a day (BID) | INTRAVENOUS | Status: DC
  Administered 2023-05-08 – 2023-05-10 (×5): 160 mg via INTRAVENOUS
  Filled 2023-05-08 (×2): qty 10
  Filled 2023-05-08: qty 16
  Filled 2023-05-08: qty 10
  Filled 2023-05-08 (×3): qty 16

## 2023-05-08 MED ORDER — LIDOCAINE HCL 1 % IJ SOLN
INTRAMUSCULAR | Status: AC
  Filled 2023-05-08: qty 20

## 2023-05-08 MED ORDER — HEPARIN SODIUM (PORCINE) 5000 UNIT/ML IJ SOLN
5000.0000 [IU] | Freq: Three times a day (TID) | INTRAMUSCULAR | Status: DC
  Administered 2023-05-09 – 2023-05-21 (×34): 5000 [IU] via SUBCUTANEOUS
  Filled 2023-05-08 (×35): qty 1

## 2023-05-08 MED ORDER — HEPARIN SODIUM (PORCINE) 1000 UNIT/ML DIALYSIS
1000.0000 [IU] | INTRAMUSCULAR | Status: DC | PRN
  Administered 2023-05-08: 4000 [IU] via INTRAVENOUS_CENTRAL
  Filled 2023-05-08 (×2): qty 6
  Filled 2023-05-08: qty 4

## 2023-05-08 NOTE — Procedures (Signed)
Central Venous Catheter Insertion Procedure Note  Samantha [name]  [id number]  1951/10/12  Date:05/08/23  Time:2:23 PM   Provider Performing:Jens Siems Erby Pian   Procedure: Insertion of Non-tunneled Central Venous Catheter(36556)with US guidance (40981)    Indication(s) Difficult access and Hemodialysis  Consent Risks of the procedure as well as the alternatives and risks of each were explained to the patient and/or caregiver.  Consent for the procedure was obtained and is signed in the bedside chart  Anesthesia Topical only with 1% lidocaine   Timeout Verified patient identification, verified procedure, site/side was marked, verified correct patient position, special equipment/implants available, medications/allergies/relevant history reviewed, required imaging and test results available.  Sterile Technique Maximal sterile technique including [name] sterile barrier drape, hand hygiene, sterile gown, sterile gloves, mask, hair covering, sterile ultrasound probe cover (if used).  Procedure Description Area of catheter insertion was cleaned with chlorhexidine and draped in sterile fashion.   With real-time ultrasound guidance a HD catheter was placed into the right internal jugular vein.  Nonpulsatile blood flow and easy flushing noted in all ports.  The catheter was sutured in place and sterile dressing applied.  Complications/Tolerance None; patient tolerated the procedure well. Chest X-ray is ordered to verify placement for internal jugular or subclavian cannulation.  Chest x-ray is not ordered for femoral cannulation.  EBL Minimal  Specimen(s) None

## 2023-05-08 NOTE — Progress Notes (Signed)
NAME:  Samantha [name], MRN:  [id number], DOB:  06-02-1951, LOS: 3 ADMISSION DATE:  05/05/2023, CONSULTATION DATE:  05/08/23 REFERRING MD:  Louie Bun, MD CHIEF COMPLAINT:  Acute hypoxemic respiratory failure, needing dialysis catheter   History of Present Illness:  72 year old female with CKD IIIB, HTN, DM2, PAD, CVA who presents to ED for shortness of breath secondary to volume overload. Recently hospitalized and discharged 04/18/23 for E coli UTI c/b pyelonephritis and AKI resulting in volume overload. Represented for volume overload despite PO lasix on this hospitalization and has not responded to IV diuresis and transferred to [facility] for plan for dialysis.  PCCM consulted for increased O2 requirement up to 15L HFNC. Plan to transfer to ICU for worsening hypoxemia and need for dialysis  Pertinent  Medical History  CKD IIIB, HTN, DM2, PAD, CVA   Significant Hospital Events: Including procedures, antibiotic start and stop dates in addition to other pertinent events     Interim History / Subjective:  As above  Objective   Blood pressure (!) 152/74, pulse 78, temperature 98.2 F (36.8 C), temperature source Oral, resp. rate 20, height 5\' 5"  (1.651 m), SpO2 94 %.    FiO2 (%):  [70 %] 70 %   Intake/Output Summary (Last 24 hours) at 05/08/2023 1101 Last data filed at 05/08/2023 0825 Gross per 24 hour  Intake 25.83 ml  Output 700 ml  Net -674.17 ml   There were no vitals filed for this visit.  Physical Exam: General: Well-appearing, no acute distress HENT: , AT, OP clear, MMM Eyes: EOMI, no scleral icterus Respiratory: Diminished to auscultation bilaterally.  No crackles, wheezing or rales Cardiovascular: RRR, -M/R/G, no JVD GI: BS+, soft, nontender Extremities:-Edema,-tenderness Neuro: AAO x4, CNII-XII grossly intact Psych: Normal mood, normal affect   Resolved Hospital Problem list     Assessment & Plan:   Acute hypoxemic respiratory failure 2/2 volume  overload --Wean supplemental O2 for goal SpO2 >88% --BiPAP PRN   AoCKD IIIB - unresponsive to steroids --Plan for dialysis catheter placement --Nephrology following --Monitor UOP/Cr  HTN, HLD --Carvedilol --Home statin  DM2 CBG q4h  Hx CVA --Plavix  Best Practice (right click and "Reselect all SmartList Selections" daily)   Diet/type: NPO DVT prophylaxis: prophylactic heparin  GI prophylaxis: N/A Lines: N/A Foley:  N/A Code Status:  full code Last date of multidisciplinary goals of care discussion [5/11] - Full code confirmed by patient and daughter. Spanish-speaking.  Labs   CBC: Recent Labs  Lab 05/05/23 0927 05/06/23 0518 05/07/23 0532  WBC 7.5 9.2 7.0  HGB 10.6* 9.2* 8.9*  HCT 31.5* 28.2* 27.3*  MCV 92.9 95.3 95.8  PLT 390 341 285    Basic Metabolic Panel: Recent Labs  Lab 05/05/23 0927 05/06/23 0518 05/07/23 0532 05/08/23 0636  NA 133* 133* 133* 134*  K 3.4* 3.8 3.9 3.9  CL 102 101 99 98  CO2 20* 23 25 26   GLUCOSE 201* 123* 69* 118*  BUN 25* 29* 35* 35*  CREATININE 2.34* 2.63* 2.83* 2.90*  CALCIUM 8.7* 8.3* 8.4* 8.5*  PHOS  --   --   --  5.3*   GFR: CrCl cannot be calculated (Unknown ideal weight.). Recent Labs  Lab 05/05/23 0927 05/06/23 0518 05/06/23 1606 05/07/23 0532  PROCALCITON  --   --  <0.10  --   WBC 7.5 9.2  --  7.0    Liver Function Tests: Recent Labs  Lab 05/05/23 0927 05/06/23 0518 05/07/23 0532 05/08/23 0636  AST 21  21 27  --   ALT 17 17 22   --   ALKPHOS 67 61 63  --   BILITOT 0.8 0.5 0.7  --   PROT 7.0 6.5 6.4*  --   ALBUMIN 3.6 3.3* 3.1* 3.0*   Recent Labs  Lab 05/05/23 0927  LIPASE 46   No results for input(s): "AMMONIA" in the last 168 hours.  ABG    Component Value Date/Time   HCO3 27.9 05/06/2023 2321   TCO2 20 (L) 11/08/2021 1119   ACIDBASEDEF 6.0 (H) 04/14/2023 1840   O2SAT 83 05/06/2023 2321     Coagulation Profile: No results for input(s): "INR", "PROTIME" in the last 168  hours.  Cardiac Enzymes: No results for input(s): "CKTOTAL", "CKMB", "CKMBINDEX", "TROPONINI" in the last 168 hours.  HbA1C: HbA1c, POC (controlled diabetic range)  Date/Time Value Ref Range Status  04/12/2023 04:27 PM 8.0 (A) 0.0 - 7.0 % Final  02/23/2022 04:09 PM 9.1 (A) 0.0 - 7.0 % Final   Hgb A1c MFr Bld  Date/Time Value Ref Range Status  04/15/2023 08:34 AM 7.7 (H) 4.8 - 5.6 % Final    Comment:    (NOTE) Pre diabetes:          5.7%-6.4%  Diabetes:              >6.4%  Glycemic control for   <7.0% adults with diabetes     CBG: Recent Labs  Lab 05/07/23 1218 05/07/23 1608 05/07/23 1744 05/07/23 2103 05/08/23 0603  GLUCAP 171* 159* 144* 208* 115*    Review of Systems:   Review of Systems  Constitutional:  Positive for malaise/fatigue. Negative for chills, diaphoresis, fever and weight loss.  HENT:  Negative for congestion.   Respiratory:  Positive for shortness of breath. Negative for cough, hemoptysis, sputum production and wheezing.   Cardiovascular:  Negative for chest pain, palpitations and leg swelling.  Gastrointestinal:  Positive for abdominal pain. Negative for heartburn, nausea and vomiting.     Past Medical History:  She,  has a past medical history of Acute pyelonephritis, Bacteremia, escherichia coli (01/12/2015), Chest pain, Critical lower limb ischemia (HCC), Hyperlipidemia, Hypertension, Left carotid bruit, PAD (peripheral artery disease) (HCC), and Type II diabetes mellitus (HCC).   Surgical History:   Past Surgical History:  Procedure Laterality Date   ABDOMINAL ANGIOGRAM  05/16/2015   Procedure: Abdominal Angiogram;  Surgeon: Runell Gess, MD;  Location: [facility] INVASIVE CV LAB;  Service: Cardiovascular;;   ABDOMINAL HYSTERECTOMY  ~ 2000   PERIPHERAL VASCULAR CATHETERIZATION Bilateral 05/16/2015   Procedure: Lower Extremity Angiography;  Surgeon: Runell Gess, MD; renal arteries widely patent, L-SFA 75%, L-pop 95%, L-peroneal 90%; R-SFA 40%,  R-pop 50%, R-prox peroneal 90%; directional atherectomy L-SFA, p-pop, drug-eluting balloon angioplasty reducing the stenoses to 0, small linear dissection was not flow limiting       PERIPHERAL VASCULAR CATHETERIZATION Left 05/16/2015   Procedure: Peripheral Vascular Atherectomy;  Surgeon: Runell Gess, MD;  Location: [facility] INVASIVE CV LAB;  Service: Cardiovascular;  Laterality: Left;  sfa     Social History:   reports that she has never smoked. She has never used smokeless tobacco. She reports current alcohol use of about 1.0 - 2.0 standard drink of alcohol per week. She reports that she does not use drugs.   Family History:  Her family history includes Hypertension in her mother.   Allergies Allergies  Allergen Reactions   Perflutren Other (See Comments)    Pt with back pain as soon  as administered.- patient refuted this in 2024     Home Medications  Prior to Admission medications   Medication Sig Start Date End Date Taking? Authorizing Provider  amLODipine (NORVASC) 10 MG tablet Take 1 tablet (10 mg total) by mouth daily. 04/12/23  Yes Hoy Register, MD  atorvastatin (LIPITOR) 80 MG tablet Take 1 tablet (80 mg total) by mouth daily. 04/12/23  Yes Hoy Register, MD  cephALEXin (KEFLEX) 500 MG capsule Take 500 mg by mouth 2 (two) times daily.   Yes [provider]  clopidogrel (PLAVIX) 75 MG tablet Take 1 tablet (75 mg total) by mouth daily. 04/12/23  Yes Hoy Register, MD  diltiazem (CARDIZEM SR) 120 MG 12 hr capsule Take 120 mg by mouth daily.   Yes [provider]  furosemide (LASIX) 40 MG tablet Take 1 tablet (40 mg total) by mouth daily for 7 days. 04/18/23 05/05/23 Yes Burnadette Pop, MD  glimepiride (AMARYL) 4 MG tablet Take 1 tablet (4 mg total) by mouth daily with breakfast. 04/12/23  Yes Newlin, Enobong, MD  hydrALAZINE (APRESOLINE) 50 MG tablet Take 1 tablet (50 mg total) by mouth every 8 (eight) hours. 04/18/23  Yes Burnadette Pop, MD  sodium bicarbonate  650 MG tablet Take 1 tablet (650 mg total) by mouth 3 (three) times daily. 04/18/23  Yes Burnadette Pop, MD  glucose blood test strip Use 3 times daily before meals 07/21/18   Hoy Register, MD  insulin isophane & regular human KwikPen (HUMULIN 70/30 KWIKPEN) (70-30) 100 UNIT/ML KwikPen Inject 30 Units into the skin in the morning AND 25 Units every evening. Patient taking differently: Inject 30 Units into the skin in the morning AND 26 Units every evening. 04/12/23   Hoy Register, MD  Insulin Syringe-Needle U-100 (BD INSULIN SYRINGE ULTRAFINE) 31G X 15/64" 0.5 ML MISC Use subcutaneously twice daily 01/28/17   Hoy Register, MD  TRUEPLUS LANCETS 28G MISC 1 each by Does not apply route 3 (three) times daily before meals. 12/23/16   Hoy Register, MD     Critical care time: 40 min    The patient is critically ill with multiple organ systems failure and requires high complexity decision making for assessment and support, frequent evaluation and titration of therapies, application of advanced monitoring technologies and extensive interpretation of multiple databases.   Mechele Collin, M.D. [facility] Pulmonary/Critical Care Medicine 05/08/2023 11:01 AM   Please see Amion for pager number to reach on-call Pulmonary and Critical Care Team.

## 2023-05-08 NOTE — Progress Notes (Signed)
[facility]  PROGRESS NOTE  Samantha [name] ZOX:[id number] DOB: Apr 17, 1951 DOA: 05/05/2023 PCP: Hoy Register, MD   Brief HPI:   As per prior documentation done by Dr. Mauro Kaufmann: "72 y.o. female, with medical history of PAD, hypertension, diabetes mellitus type 2, insulin-dependent, history of CVA, depression, anxiety who was just discharged from the hospital on 4/21 after she was treated for UTI/pyelonephritis, pulmonary edema.  Patient was discharged on Lasix 40 mg p.o. daily for 7 days. The patient's daughter who was acting as interpreter, patient developed worsening abdominal discomfort yesterday and also developed chest discomfort.  She denies nausea vomiting or diarrhea.  Denies dysuria or fever.  But complains of chills. In the ED CT chest abdomen/pelvis was done which showed pulmonary edema versus bibasilar infiltrates.  BNP was elevated to 600".  05/08/2023: Patient seen alongside patient's daughter.  Daughter assisted with history.  Available records and workup reviewed.  Input from nephrology and critical/pulmonary team appreciated.  Available records and collateral information point towards volume overload and worsening respiratory failure.  However, patient describes fullness of the epigastric area, with associated nausea.  Patient has history of diabetes mellitus.  Last hemoglobin A1c was on 04/15/2023 (7.7%).  Patient has not particularly good historian.  Patient is on significant amount of supplemental oxygen.  Nephrology team plans to place hemodialysis catheter   Subjective   Reports fullness of the epigastric area and associated nausea.  Patient seen and examined, worsening respiratory status now requiring 15 L of oxygen HFNC.  Lasix not effective.   Assessment/Plan:    Acute hypoxemic respiratory failure -Secondary pulmonary edema, bilateral pleural effusion, BNP elevated 601 -Procalcitonin less than 0.10 -Diuresed well with IV Lasix 40 mg IV; however not  diuresing well despite high dose of Lasix per nephrology -Now requiring 15 L of oxygen HFNC -Plan to transfer to [facility], insert HD catheter and initiate urgent dialysis 05/08/2023: See above documentation.  Nephrology team plans placement of hemodialysis catheter and likely possible hemodialysis.   Pulmonary edema/acute on chronic diastolic heart failure -Likely from underlying diastolic heart failure, BNP elevated 600 -Chest x-ray shows pulmonary edema -Started on high-dose Lasix as above without any improvement -Echocardiogram obtained this morning showed EF 55 to 60%, mild LVH, left ventricular diastolic parameters indeterminate 05/08/2023: EF is noted.  Diastolic dysfunction is indeterminate.  Suspect acute on chronic diastolic CHF.  Pulmonary edema versus noncardiogenic pulmonary edema.  Hemodialysis is planned.  CKD stage IV -Creatinine is worse at 2.83 today -Started on IV Lasix as above -Nephrology following  Pneumonia: -Chest x-ray showed bibasilar patchy opacities, concern for pneumonia -Received 1 dose of Rocephin and Zithromax in the ED -Antibiotics were discontinued -Procalcitonin less than 0.10  Abdominal pain -Resolved, patient had abdominal distention yesterday -Also complains of constipation for past 8 days, had BM 1 day prior to coming to the ED -Continue MiraLAX 17 g p.o. daily  Diabetes mellitus type 2 -Glimepiride on hold -She was hypoglycemic this morning, will discontinue Novolin 70/30 -CBG well-controlled; continue sliding scale insulin with NovoLog  History of CAD -Continue Plavix  Hypertension -Continue Coreg; amlodipine on hold -Blood pressure is stable  Abdominal fullness: -Poor historian -Patient is a diabetic. -Consider gastric emptying studies.   Medications     atorvastatin  80 mg Oral Daily   carvedilol  25 mg Oral BID WC   clopidogrel  75 mg Oral Daily   diphenhydrAMINE  25 mg Intravenous Once   fluticasone  2 spray Each Nare Daily    guaiFENesin  600 mg Oral BID   [START ON 05/09/2023] heparin  5,000 Units Subcutaneous Q8H   insulin aspart  0-9 Units Subcutaneous TID WC   oxymetazoline  1 spray Each Nare Once   polyethylene glycol  17 g Oral TID   sodium chloride flush  3 mL Intravenous Q12H     Data Reviewed:   CBG:  Recent Labs  Lab 05/07/23 1608 05/07/23 1744 05/07/23 2103 05/08/23 0603 05/08/23 1128  GLUCAP 159* 144* 208* 115* 157*     SpO2: 93 % O2 Flow Rate (L/min): 15 L/min FiO2 (%): 70 %    Vitals:   05/08/23 0316 05/08/23 0811 05/08/23 0825 05/08/23 1131  BP: (!) 151/66  (!) 152/74 (!) 151/69  Pulse: 72 73 78 71  Resp: 20 20 20 14   Temp: 98.1 F (36.7 C)  98.2 F (36.8 C) 98.2 F (36.8 C)  TempSrc: Oral  Oral Oral  SpO2: 92% 94% 94% 93%  Height:          Data Reviewed:  Basic Metabolic Panel: Recent Labs  Lab 05/05/23 0927 05/06/23 0518 05/07/23 0532 05/08/23 0636  NA 133* 133* 133* 134*  K 3.4* 3.8 3.9 3.9  CL 102 101 99 98  CO2 20* 23 25 26   GLUCOSE 201* 123* 69* 118*  BUN 25* 29* 35* 35*  CREATININE 2.34* 2.63* 2.83* 2.90*  CALCIUM 8.7* 8.3* 8.4* 8.5*  PHOS  --   --   --  5.3*     CBC: Recent Labs  Lab 05/05/23 0927 05/06/23 0518 05/07/23 0532  WBC 7.5 9.2 7.0  HGB 10.6* 9.2* 8.9*  HCT 31.5* 28.2* 27.3*  MCV 92.9 95.3 95.8  PLT 390 341 285     LFT Recent Labs  Lab 05/05/23 0927 05/06/23 0518 05/07/23 0532 05/08/23 0636  AST 21 21 27   --   ALT 17 17 22   --   ALKPHOS 67 61 63  --   BILITOT 0.8 0.5 0.7  --   PROT 7.0 6.5 6.4*  --   ALBUMIN 3.6 3.3* 3.1* 3.0*      Antibiotics: Anti-infectives (From admission, onward)    Start     Dose/Rate Route Frequency Ordered Stop   05/08/23 1000  ceFAZolin (ANCEF) IVPB 2g/100 mL premix        2 g 200 mL/hr over 30 Minutes Intravenous To Radiology 05/07/23 1537 05/09/23 1000   05/05/23 1515  cefTRIAXone (ROCEPHIN) 1 g in sodium chloride 0.9 % 100 mL IVPB        1 g 200 mL/hr over 30 Minutes  Intravenous  Once 05/05/23 1510 05/05/23 1642   05/05/23 1515  azithromycin (ZITHROMAX) 500 mg in sodium chloride 0.9 % 250 mL IVPB        500 mg 250 mL/hr over 60 Minutes Intravenous  Once 05/05/23 1510 05/05/23 1750        DVT prophylaxis: Heparin  Code Status: Full code  Family Communication:    CONSULTS    Objective    Physical Examination: General condition: Patient is obese.  Not in any obvious distress.  Awake and alert. Heart-S1-S2, regular Lungs-patient only take shallow breaths.  Abdomen-obese, soft and nontender. Extremities-no edema in the lower extremities Neuro-awake and alert.  Patient moves all extremities.    Status is: Inpatient:             Barnetta Chapel   [facility] If 7PM-7AM, please contact night-coverage at [email or web address], Office  [phone]   05/08/2023, 11:45 AM  LOS: 3 days

## 2023-05-08 NOTE — Progress Notes (Addendum)
PCCM consulted for HD catheter placement Patient also had bilateral thoracentesis. 1L removed from left side and 1.5L removed from right side O2 requirement improved from 15L to 4L No ICU needs  Updated hospitalist team Service level updated to progressive. PCCM will continue to follow for management of pleural effusion and evaluation for ?PH

## 2023-05-08 NOTE — Procedures (Addendum)
Thoracentesis  Procedure Note  Addy Irey  [id number]  04-23-1951  Date:05/08/23  Time:2:24 PM   Provider Performing:Shahiem Bedwell C Katrinka Blazing   Procedure: Thoracentesis with imaging guidance (40981)  Indication(s) Pleural Effusion  Consent Risks of the procedure as well as the alternatives and risks of each were explained to the patient and/or caregiver.  Consent for the procedure was obtained and is signed in the bedside chart  Anesthesia Topical only with 1% lidocaine    Time Out Verified patient identification, verified procedure, site/side was marked, verified correct patient position, special equipment/implants available, medications/allergies/relevant history reviewed, required imaging and test results available.   Sterile Technique Maximal sterile technique including full sterile barrier drape, hand hygiene, sterile gown, sterile gloves, mask, hair covering, sterile ultrasound probe cover (if used).  Procedure Description Ultrasound was used to identify appropriate pleural anatomy for placement and overlying skin marked.  Area of drainage cleaned and draped in sterile fashion. Lidocaine was used to anesthetize the skin and subcutaneous tissue.  1500 cc's of straw appearing fluid was drained from the right pleural space. Catheter then removed and bandaid applied to site.   Complications/Tolerance None; patient tolerated the procedure well. Chest X-ray is ordered to confirm no post-procedural complication.   EBL Minimal   Specimen(s) Pleural fluid

## 2023-05-08 NOTE — Progress Notes (Signed)
Pt currently off bipap and on HFNC (salter) and is tolerating it well at this time with stable VS.

## 2023-05-08 NOTE — Progress Notes (Addendum)
Nephrology Follow-Up Consult note   Assessment/Recommendations: Samantha [name] is a/an 72 y.o. female with a past medical history significant for HTN, DM2, PAD, CVA, HLD, CKD 3B who presents with shortness of breath and concern for volume overload  The patient was recently hospitalized prior to this hospitalization for E. coli UTI and pyelonephritis.  She also was taking an ARB as well as NSAIDs at home.  She had an AKI and was given IV fluids.  She developed some volume overload and IV fluids were stopped.  Her creatinine was 2.9 at discharge.  When she read presented for this hospitalization her creatinine was 2.4.  She was taking Lasix 40 mg daily but had ran out a couple days before arriving.  Overall her symptoms are fairly nonspecific.  Since admission she has responded poorly to IV diuresis.  She was transferred from [facility] to [facility] due to likely need for dialysis.    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  Creatinine up to 2.9 today.  Has received escalating doses of diuretics with poor response.  Planning for dialysis catheter placement today. -TDC placement with IR.  Appreciate help -Will try significant doses of diuretics with IV Lasix 160 mg plus metolazone 5 mg -If she continues to not respond we will plan for dialysis this afternoon -Continue to monitor daily Cr, Dose meds for GFR -Monitor Daily I/Os, Daily weight  -Maintain MAP>65 for optimal renal perfusion.  -Avoid nephrotoxic medications including NSAIDs -Use synthetic opioids (Fentanyl/Dilaudid) if needed -Serologies have been sent to workup for possible glomerular disease but seems unlikely given no significant hematuria.  Proteinuria is most likely related to her history of diabetes and recent tubular injury.  Acute hypoxic respiratory failure.  Fine crackles on exam.  Most likely pulmonary edema but not definitively clear.  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  Also her hypoxia seems to be out of proportion to the appearance of her chest x-ray as would be expected with pulmonary edema alone.  Possibly sarcoidosis could be a consideration?  Continue with supplemental oxygen and diuresis/dialysis as above.  However, have asked pulmonary to consult on the patient and offer any advice.  Appreciate help  Volume overload: Mostly as above.  Of note, ejection fraction is normal.  Echocardiogram did demonstrate that IVC was distended.  Continue with plans for dialysis and diuresis as above  Uncontrolled Diabetes Mellitus Type 2 with Hyperglycemia: Management per primary team  HTN: Medications per primary on carvedilol  Metabolic acidosis: Resolved.  Stop sodium bicarbonate.  Abdominal distention/fullness: Possible constipation.  Increase MiraLAX to 3 times daily   Recommendations conveyed to primary service.    Darnell Level [facility] 05/08/2023 10:10 AM  ___________________________________________________________  CC: SOB  Interval History/Subjective: Patient transferred from [facility]  Persistent shortness of breath and persistent sensation of abdomen being distended.  Urine output overall fairly minimal.  Creatinine overall minimally changed.  She has not had a bowel movement in several days.   Medications:  Current Facility-Administered Medications  Medication Dose Route Frequency Provider Last Rate Last Admin   0.9 %  sodium chloride infusion  250 mL Intravenous PRN Sharl Ma, Sarina Ill, MD       albuterol (PROVENTIL) (2.5 MG/3ML) 0.083% nebulizer solution 2.5 mg  2.5 mg Nebulization Q4H PRN Meredeth Ide, MD       carvedilol (COREG) tablet 25 mg  25 mg Oral BID WC Meredeth Ide, MD   25 mg at 05/08/23 0853   ceFAZolin (ANCEF) IVPB 2g/100 mL premix  2 g Intravenous to XRAY [place]ard, Arman Filter, NP       clopidogrel (PLAVIX) tablet 75 mg  75 mg Oral Daily Meredeth Ide, MD   75 mg at 05/08/23  0981   diphenhydrAMINE (BENADRYL) injection 25 mg  25 mg Intravenous Once [place], Sarina Ill, MD       fluticasone (FLONASE) 50 MCG/ACT nasal spray 2 spray  2 spray Each Nare Daily Meredeth Ide, MD   2 spray at 05/07/23 1051   furosemide (LASIX) 160 mg in dextrose 5 % 50 mL IVPB  160 mg Intravenous BID Darnell Level, MD 66 mL/hr at 05/08/23 0853 160 mg at 05/08/23 0853   guaiFENesin (MUCINEX) 12 hr tablet 600 mg  600 mg Oral BID Meredeth Ide, MD   600 mg at 05/08/23 0853   heparin injection 5,000 Units  5,000 Units Subcutaneous Q8H Meredeth Ide, MD   5,000 Units at 05/08/23 0647   insulin aspart (novoLOG) injection 0-9 Units  0-9 Units Subcutaneous TID WC Meredeth Ide, MD   2 Units at 05/06/23 1230   ondansetron (ZOFRAN) tablet 4 mg  4 mg Oral Q6H PRN Meredeth Ide, MD       Or   ondansetron [facility]) injection 4 mg  4 mg Intravenous Q6H PRN Meredeth Ide, MD   4 mg at 05/06/23 1711   oxymetazoline (AFRIN) 0.05 % nasal spray 1 spray  1 spray Each Nare Once [place], Sarina Ill, MD       polyethylene glycol (MIRALAX / GLYCOLAX) packet 17 g  17 g Oral TID Darnell Level, MD   17 g at 05/08/23 1914   sodium bicarbonate tablet 650 mg  650 mg Oral TID Meredeth Ide, MD   650 mg at 05/08/23 7829   sodium chloride (OCEAN) 0.65 % nasal spray 1 spray  1 spray Each Nare PRN Meredeth Ide, MD       sodium chloride flush (NS) 0.9 % injection 3 mL  3 mL Intravenous Q12H Sharl Ma, Sarina Ill, MD   3 mL at 05/07/23 2259   sodium chloride flush (NS) 0.9 % injection 3 mL  3 mL Intravenous PRN Meredeth Ide, MD          Review of Systems: 10 systems reviewed and negative except per interval history/subjective  Physical Exam: Vitals:   05/08/23 0811 05/08/23 0825  BP:  (!) 152/74  Pulse: 73 78  Resp: 20 20  Temp:  98.2 F (36.8 C)  SpO2: 94% 94%   Total I/O In: -  Out: 500 [Urine:500]  Intake/Output Summary (Last 24 hours) at 05/08/2023 1010 Last data filed at 05/08/2023 0825 Gross per 24 hour  Intake 25.83  ml  Output 700 ml  Net -674.17 ml   Constitutional: Ill-appearing, sitting up in bed, no distress ENMT: ears and nose without scars or lesions, MMM CV: normal rate, no edema Respiratory: Fine crackles heard throughout the lungs, no wheezing, mild increased work of breathing, desaturations when talking Gastrointestinal: soft, moderately distended, minimal pain with palpation Skin: no visible lesions or rashes Psych: alert, judgement/insight appropriate, appropriate mood and affect   Test Results I personally reviewed new and old clinical labs and radiology tests Lab Results  Component Value Date   NA 134 (L) 05/08/2023   K 3.9 05/08/2023  CL 98 05/08/2023   CO2 26 05/08/2023   BUN 35 (H) 05/08/2023   CREATININE 2.90 (H) 05/08/2023   CALCIUM 8.5 (L) 05/08/2023   ALBUMIN 3.0 (L) 05/08/2023   PHOS 5.3 (H) 05/08/2023    CBC Recent Labs  Lab 05/05/23 0927 05/06/23 0518 05/07/23 0532  WBC 7.5 9.2 7.0  HGB 10.6* 9.2* 8.9*  HCT 31.5* 28.2* 27.3*  MCV 92.9 95.3 95.8  PLT 390 341 285

## 2023-05-08 NOTE — Plan of Care (Signed)
  Problem: Health Behavior/Discharge Planning: Goal: Ability to manage health-related needs will improve Outcome: Not Progressing   

## 2023-05-08 NOTE — Procedures (Signed)
Thoracentesis  Procedure Note  Samantha [name]  [id number]  17-Oct-1951  Date:05/08/23  Time:2:24 PM   Provider Performing:Deryck Hippler C Katrinka Blazing   Procedure: Thoracentesis with imaging guidance (82956)  Indication(s) Pleural Effusion  Consent Risks of the procedure as well as the alternatives and risks of each were explained to the patient and/or caregiver.  Consent for the procedure was obtained and is signed in the bedside chart  Anesthesia Topical only with 1% lidocaine    Time Out Verified patient identification, verified procedure, site/side was marked, verified correct patient position, special equipment/implants available, medications/allergies/relevant history reviewed, required imaging and test results available.   Sterile Technique Maximal sterile technique including full sterile barrier drape, hand hygiene, sterile gown, sterile gloves, mask, hair covering, sterile ultrasound probe cover (if used).  Procedure Description Ultrasound was used to identify appropriate pleural anatomy for placement and overlying skin marked.  Area of drainage cleaned and draped in sterile fashion. Lidocaine was used to anesthetize the skin and subcutaneous tissue.  1000 cc's of straw appearing fluid was drained from the left pleural space. Catheter then removed and bandaid applied to site.   Complications/Tolerance None; patient tolerated the procedure well. Chest X-ray is ordered to confirm no post-procedural complication.   EBL Minimal   Specimen(s) None

## 2023-05-09 ENCOUNTER — Inpatient Hospital Stay (HOSPITAL_COMMUNITY): Payer: Medicaid Other

## 2023-05-09 LAB — RENAL FUNCTION PANEL
Albumin: 3 g/dL — ABNORMAL LOW (ref 3.5–5.0)
Anion gap: 14 (ref 5–15)
BUN: 38 mg/dL — ABNORMAL HIGH (ref 8–23)
CO2: 28 mmol/L (ref 22–32)
Calcium: 8.9 mg/dL (ref 8.9–10.3)
Chloride: 93 mmol/L — ABNORMAL LOW (ref 98–111)
Creatinine, Ser: 2.92 mg/dL — ABNORMAL HIGH (ref 0.44–1.00)
GFR, Estimated: 17 mL/min — ABNORMAL LOW (ref >60)
Glucose, Bld: 114 mg/dL — ABNORMAL HIGH (ref 70–99)
Phosphorus: 5.2 mg/dL — ABNORMAL HIGH (ref 2.5–4.6)
Potassium: 3 mmol/L — ABNORMAL LOW (ref 3.5–5.1)
Sodium: 135 mmol/L (ref 135–145)

## 2023-05-09 LAB — MAGNESIUM: Magnesium: 2.1 mg/dL (ref 1.7–2.4)

## 2023-05-09 LAB — GLUCOSE, CAPILLARY
Glucose-Capillary: 127 mg/dL — ABNORMAL HIGH (ref 70–99)
Glucose-Capillary: 265 mg/dL — ABNORMAL HIGH (ref 70–99)
Glucose-Capillary: 169 mg/dL — ABNORMAL HIGH (ref 70–99)
Glucose-Capillary: 151 mg/dL — ABNORMAL HIGH (ref 70–99)

## 2023-05-09 LAB — C3 COMPLEMENT: C3 Complement: 143 mg/dL (ref 82–167)

## 2023-05-09 LAB — CBC
HCT: 27.4 % — ABNORMAL LOW (ref 36.0–46.0)
Hemoglobin: 9.4 g/dL — ABNORMAL LOW (ref 12.0–15.0)
MCH: 31 pg (ref 26.0–34.0)
MCHC: 34.3 g/dL (ref 30.0–36.0)
MCV: 90.4 fL (ref 80.0–100.0)
Platelets: 327 10*3/uL (ref 150–400)
RBC: 3.03 MIL/uL — ABNORMAL LOW (ref 3.87–5.11)
RDW: 12.6 % (ref 11.5–15.5)
WBC: 7.9 10*3/uL (ref 4.0–10.5)
nRBC: 0 % (ref 0.0–0.2)

## 2023-05-09 LAB — MRSA NEXT GEN BY PCR, NASAL: MRSA by PCR Next Gen: NOT DETECTED

## 2023-05-09 LAB — CULTURE, BLOOD (ROUTINE X 2): Special Requests: ADEQUATE

## 2023-05-09 LAB — D-DIMER, QUANTITATIVE: D-Dimer, Quant: 1.18 ug/mL-FEU — ABNORMAL HIGH (ref 0.00–0.50)

## 2023-05-09 LAB — HAPTOGLOBIN: Haptoglobin: 260 mg/dL (ref 37–355)

## 2023-05-09 LAB — RHEUMATOID FACTOR: Rheumatoid fact SerPl-aCnc: 10.8 IU/mL (ref <14.0)

## 2023-05-09 LAB — ANTISTREPTOLYSIN O TITER: ASO: 73 IU/mL (ref 0.0–200.0)

## 2023-05-09 LAB — TRIGLYCERIDES, BODY FLUIDS: Triglycerides, Fluid: 10 mg/dL

## 2023-05-09 LAB — C4 COMPLEMENT: Complement C4, Body Fluid: 36 mg/dL (ref 12–38)

## 2023-05-09 MED ORDER — MINERAL OIL RE ENEM
1.0000 | ENEMA | Freq: Once | RECTAL | Status: AC
  Administered 2023-05-09: 1 via RECTAL
  Filled 2023-05-09: qty 1

## 2023-05-09 MED ORDER — POTASSIUM CHLORIDE CRYS ER 20 MEQ PO TBCR
40.0000 meq | EXTENDED_RELEASE_TABLET | ORAL | Status: AC
  Administered 2023-05-09 (×2): 40 meq via ORAL
  Filled 2023-05-09 (×2): qty 2

## 2023-05-09 MED ORDER — METOLAZONE 5 MG PO TABS
5.0000 mg | ORAL_TABLET | Freq: Once | ORAL | Status: AC
  Administered 2023-05-09: 5 mg via ORAL
  Filled 2023-05-09: qty 1

## 2023-05-09 MED ORDER — FENTANYL CITRATE PF 50 MCG/ML IJ SOSY
12.5000 ug | PREFILLED_SYRINGE | INTRAMUSCULAR | Status: DC | PRN
  Administered 2023-05-09: 12.5 ug via INTRAVENOUS
  Filled 2023-05-09: qty 1

## 2023-05-09 MED ORDER — FENTANYL CITRATE PF 50 MCG/ML IJ SOSY
12.5000 ug | PREFILLED_SYRINGE | INTRAMUSCULAR | Status: AC | PRN
  Administered 2023-05-09 (×2): 25 ug via INTRAVENOUS
  Filled 2023-05-09 (×2): qty 1

## 2023-05-09 MED ORDER — OXYCODONE HCL 5 MG PO TABS: 5.0000 mg | ORAL_TABLET | Freq: Four times a day (QID) | ORAL | Status: DC | PRN

## 2023-05-09 NOTE — Progress Notes (Signed)
Pt complains of constipation while up on BSC. Attempted to disimpact but pt found to have multiple hard balls of stool in rectum and unable to manually disimpact. Reached out to on call [facility] MD about enema order. New order received for mineral oil enema. After enema pt able to be manually disimpacted by RN. Large hard stool removed from rectum but pt still has more hard stool beyond reach. Pt tolerated disimpaction well.

## 2023-05-09 NOTE — Progress Notes (Signed)
Nephrology Follow-Up Consult note   Assessment/Recommendations: Samantha [name] is a/an 72 y.o. female with a past medical history significant for HTN, DM2, PAD, CVA, HLD, CKD 3B who presents with shortness of breath and concern for volume overload  The patient was recently hospitalized prior to this hospitalization for E. coli UTI and pyelonephritis.  She also was taking an ARB as well as NSAIDs at home.  She had an AKI and was given IV fluids.  She developed some volume overload and IV fluids were stopped.  Her creatinine was 2.9 at discharge.  When she read presented for this hospitalization her creatinine was 2.4.  She was taking Lasix 40 mg daily but had ran out a couple days before arriving.  Overall her symptoms are fairly nonspecific.  Since admission she has responded poorly to IV diuresis.  She was transferred from [facility] to [facility] due to likely need for dialysis.    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  Creatinine up to 2.9 at this time.  For spots initially to diuretics but has responded to higher doses as well as addition of metolazone -Temporary catheter placed by CCM on 5/11, appreciate help -Continue with IV Lasix 160 mg twice daily and metolazone 5mg  today -Consider dialysis if she fails to improve -Continue to monitor daily Cr, Dose meds for GFR -Monitor Daily I/Os, Daily weight  -Maintain MAP>65 for optimal renal perfusion.  -Avoid nephrotoxic medications including NSAIDs -Use synthetic opioids (Fentanyl/Dilaudid) if needed -Serologies have been sent to workup for possible glomerular disease but seems unlikely given no significant hematuria.  Proteinuria is most likely related to her history of diabetes and recent tubular injury.  Acute hypoxic respiratory failure.  Most likely related to volume overload.  Appreciate help from pulmonary critical care team.  Seems to be improving after thoracentesis and diuresis.   Continue with diuresis as above  Volume overload: Mostly as above.  Of note, ejection fraction is normal.  Echocardiogram did demonstrate that IVC was distended.  She seems to be improving with diuresis as well as thoracentesis.  Continue diuretics as above  Uncontrolled Diabetes Mellitus Type 2 with Hyperglycemia: Management per primary team  HTN: Medications per primary on carvedilol  Metabolic acidosis: Resolved.  Abdominal distention/fullness: Possible constipation.  Titration of medications per primary team   Recommendations conveyed to primary service.    Darnell Level [facility] 05/09/2023 9:14 AM  ___________________________________________________________  CC: SOB  Interval History/Subjective: Patient transferred to the ICU short-term for catheter placement yesterday.  Temporary catheter placed.  Also had 2.5 L removed with thoracentesis.  Urine output robust with 4.1 liters made yesterday.  Patient states she continues to be short of breath but overall feels better.  Abdominal pain continues to feel better.  Still has not had a bowel movement   Medications:  Current Facility-Administered Medications  Medication Dose Route Frequency Provider Last Rate Last Admin   0.9 %  sodium chloride infusion  250 mL Intravenous PRN Meredeth Ide, MD 10 mL/hr at 05/09/23 0600 Infusion Verify at 05/09/23 0600   albuterol (PROVENTIL) (2.5 MG/3ML) 0.083% nebulizer solution 2.5 mg  2.5 mg Nebulization Q4H PRN Meredeth Ide, MD       atorvastatin (LIPITOR) tablet 80 mg  80 mg Oral Daily Luciano Cutter, MD   80 mg at 05/08/23 1221   carvedilol (COREG) tablet 25 mg  25 mg Oral BID WC [place], Gagan S,  MD   25 mg at 05/09/23 0752   ceFAZolin (ANCEF) IVPB 2g/100 mL premix  2 g Intravenous to XRAY [place], Asher Muir R, NP       Chlorhexidine Gluconate Cloth 2 % PADS 6 each  6 each Topical Q0600 Berton Mount I, MD       clopidogrel (PLAVIX) tablet 75 mg  75 mg Oral Daily  Meredeth Ide, MD   75 mg at 05/08/23 1610   diphenhydrAMINE (BENADRYL) injection 25 mg  25 mg Intravenous Once [place], Sarina Ill, MD       fluticasone (FLONASE) 50 MCG/ACT nasal spray 2 spray  2 spray Each Nare Daily Meredeth Ide, MD   2 spray at 05/08/23 1052   furosemide (LASIX) 160 mg in dextrose 5 % 50 mL IVPB  160 mg Intravenous BID Darnell Level, MD 66 mL/hr at 05/09/23 0757 160 mg at 05/09/23 0757   guaiFENesin (MUCINEX) 12 hr tablet 600 mg  600 mg Oral BID Meredeth Ide, MD   600 mg at 05/08/23 0853   heparin injection 1,000-6,000 Units  1,000-6,000 Units CRRT PRN Lorin Glass, MD       heparin injection 5,000 Units  5,000 Units Subcutaneous Q8H Luciano Cutter, MD   5,000 Units at 05/09/23 0500   insulin aspart (novoLOG) injection 0-9 Units  0-9 Units Subcutaneous TID WC Meredeth Ide, MD   1 Units at 05/09/23 0744   ondansetron (ZOFRAN) tablet 4 mg  4 mg Oral Q6H PRN Meredeth Ide, MD       Or   ondansetron [facility]) injection 4 mg  4 mg Intravenous Q6H PRN Meredeth Ide, MD   4 mg at 05/06/23 1711   oxymetazoline (AFRIN) 0.05 % nasal spray 1 spray  1 spray Each Nare Once [place], Sarina Ill, MD       polyethylene glycol (MIRALAX / GLYCOLAX) packet 17 g  17 g Oral TID Darnell Level, MD   17 g at 05/08/23 0853   potassium chloride SA (KLOR-CON M) CR tablet 40 mEq  40 mEq Oral Q4H Darnell Level, MD   40 mEq at 05/09/23 9604   sodium chloride (OCEAN) 0.65 % nasal spray 1 spray  1 spray Each Nare PRN Meredeth Ide, MD       sodium chloride flush (NS) 0.9 % injection 3 mL  3 mL Intravenous Q12H Sharl Ma, Gagan S, MD   3 mL at 05/08/23 2234   sodium chloride flush (NS) 0.9 % injection 3 mL  3 mL Intravenous PRN Meredeth Ide, MD          Review of Systems: 10 systems reviewed and negative except per interval history/subjective  Physical Exam: Vitals:   05/09/23 0700 05/09/23 0800  BP: (!) 152/65 (!) 145/67  Pulse: 69 76  Resp: 16 19  Temp:    SpO2: 97% 98%   No intake/output  data recorded.  Intake/Output Summary (Last 24 hours) at 05/09/2023 0914 Last data filed at 05/09/2023 0600 Gross per 24 hour  Intake 636.31 ml  Output 7100 ml  Net -6463.69 ml   Constitutional: sitting up in bed, no distress ENMT: ears and nose without scars or lesions, MMM CV: normal rate, no edema Respiratory: Fine crackles heard throughout the lungs, no wheezing, no increased work of breathing Gastrointestinal: soft, moderately distended, minimal pain with palpation Skin: no visible lesions or rashes Psych: alert, judgement/insight appropriate, appropriate mood and affect   Test Results I personally  reviewed new and old clinical labs and radiology tests Lab Results  Component Value Date   NA 135 05/09/2023   K 3.0 (L) 05/09/2023   CL 93 (L) 05/09/2023   CO2 28 05/09/2023   BUN 38 (H) 05/09/2023   CREATININE 2.92 (H) 05/09/2023   CALCIUM 8.9 05/09/2023   ALBUMIN 3.0 (L) 05/09/2023   PHOS 5.2 (H) 05/09/2023    CBC Recent Labs  Lab 05/06/23 0518 05/07/23 0532 05/09/23 0501  WBC 9.2 7.0 7.9  HGB 9.2* 8.9* 9.4*  HCT 28.2* 27.3* 27.4*  MCV 95.3 95.8 90.4  PLT 341 285 327

## 2023-05-09 NOTE — Progress Notes (Signed)
NAME:  Samantha [name], MRN:  [id number], DOB:  Nov 01, 1951, LOS: 4 ADMISSION DATE:  05/05/2023, CONSULTATION DATE:  05/08/2023 REFERRING MD:  Louie Bun, CHIEF COMPLAINT:  Acute hypoxemic respiratory failure, needing dialysis catheter    History of Present Illness:  72 year old female with CKD IIIB, HTN, DM2, PAD, CVA who presents to ED for shortness of breath secondary to volume overload. Recently hospitalized and discharged 04/18/23 for E coli UTI c/b pyelonephritis and AKI resulting in volume overload. Represented for volume overload despite PO lasix on this hospitalization and has not responded to IV diuresis and transferred to [facility] for plan for dialysis.   PCCM consulted for increased O2 requirement up to 15L HFNC. Plan to transfer to ICU for worsening hypoxemia and need for dialysis  Pertinent  Medical History  CKD IIIB, HTN, DM2, PAD, CVA   Significant Hospital Events: Including procedures, antibiotic start and stop dates in addition to other pertinent events   5/11 consulted by nephrology due to increasing supplemental oxygen demand and possible need for HD access.  Assessed at bedside and large pleural effusion seen on ultrasound bilateral thoracenteses performed significant improvement in supplemental oxygen demand 5/12 oxygen demand down trended from 15 L to 7  Interim History / Subjective:  Seen sitting up in bed in no acute distress, daughter at bedside  Objective   Blood pressure (!) 145/67, pulse 76, temperature 98.7 F (37.1 C), temperature source Oral, resp. rate 19, height 5\' 5"  (1.651 m), weight 73.7 kg, SpO2 98 %. CVP:  [3 mmHg-4 mmHg] 4 mmHg      Intake/Output Summary (Last 24 hours) at 05/09/2023 1031 Last data filed at 05/09/2023 0600 Gross per 24 hour  Intake 636.31 ml  Output 7100 ml  Net -6463.69 ml   Filed Weights   05/08/23 1329  Weight: 73.7 kg    Examination: General: Acute on chronic ill-appearing middle-aged female lying in bed in no acute  distress HEENT: [place]/AT, MM pink/moist, PERRL,  Neuro: Alert and oriented x 3, nonfocal CV: s1s2 regular rate and rhythm, no murmur, rubs, or gallops,  PULM: Posterior crackles bilaterally, no increased work of breathing, on 7 L high flow, GI: soft, bowel sounds active in all 4 quadrants, non-tender, non-distended, tolerating oral diet Extremities: warm/dry, no edema  Skin: no rashes or lesions  Resolved Hospital Problem list     Assessment & Plan:  Acute hypoxemic respiratory failure 2/2 volume overload -Consulted by nephrology 5/11 chronic persistent hypoxic respiratory failure with increasing supplemental oxygen demand -On further evaluation and bedside ultrasound revealed bilateral large pleural effusions, bilateral thoracenteses performed left-sided revealed 1500 cc straw-colored fluid right 1000 cc straw-colored fluid.  Oxygen saturations quickly improved but unfortunately it appears she is quickly reaccumulated fluid Oliguric AKI superimposed on CKD stage IIIb -Baseline creatinine appears to be 2.5 Hypoalbuminemia P: Continue supplemental oxygen SpO2 goal greater than 90, wean as able Continue to aggressively diurese per nephrology Initiation of HD per nephrology Can consider IV albumin to assist in fluid shifts Pulmonary hygiene Mobilize as able  PCCM will sign off. Thank you for the opportunity to participate in this patient's care. Please contact if we can be of further assistance.  Best Practice (right click and "Reselect all SmartList Selections" daily)  Per primary  Critical care time:  NA  Shannin Naab D. Harris, NP-C [facility] Personal contact information can be found on Amion  If no contact or response made please call [phone] 05/09/2023, 11:02 AM

## 2023-05-09 NOTE — Progress Notes (Signed)
[facility]  PROGRESS NOTE  Samantha [name] ZOX:[id number] DOB: 1951/08/21 DOA: 05/05/2023 PCP: Hoy Register, MD   Brief HPI:   Patient is a 72 year old female with past medical history significant for PAD, hypertension, diabetes mellitus type 2, insulin-dependent, history of CVA, depression and anxiety.  Patient was discharged from the hospital on 04/18/23 after treatment for UTI/pyelonephritis, pulmonary edema.  Patient was discharged on Lasix 40 mg p.o. daily for 7 days.  Patient was admitted with developed worsening abdominal discomfort, chest discomfort and chills.  CT chest abdomen/pelvis was done which showed pulmonary edema versus bibasilar infiltrates.  BNP was elevated to 600.  Patient has been aggressively diuresed.  Based on concerns for persistent pulmonary edema, acute hypoxic respiratory failure requiring 15 L of supplemental oxygen, patient was transferred briefly to ICU.  Patient underwent bilateral thoracentesis, removing 1 L of pleural fluid from the left and 1.5 L from the right.  Oxygen supplementation has decreased to 4 to 5 L/min via nasal cannula.  Echocardiogram revealed diastolic dysfunction with moderate pulmonary hypertension.  Patient was given metolazone yesterday with significant diuresis.  Nephrology input is appreciated.  ICU team input is highly appreciated as well.   05/08/2023: Patient seen alongside patient's daughter.  Daughter assisted with history.  Available records and workup reviewed.  Input from nephrology and critical/pulmonary team appreciated.  Available records and collateral information point towards volume overload and worsening respiratory failure.  However, patient describes fullness of the epigastric area, with associated nausea.  Patient has history of diabetes mellitus.  Last hemoglobin A1c was on 04/15/2023 (7.7%).  Patient has not particularly good historian.  Patient is on significant amount of supplemental oxygen.  Nephrology team plans to  place hemodialysis catheter.  05/09/2023: Patient feels much better today.  However, patient continues to need 4 to 5 L of supplemental oxygen.  I doubt if nephrology team is proceeding with dialysis for now.  Workup is in progress.  Follow pleural fluid analysis.  Critical care input is highly appreciated.   Subjective: Shortness of breath has improved.     Assessment/Plan:  Acute hypoxemic respiratory failure: -Secondary pulmonary edema, bilateral pleural effusion, BNP elevated 601 -Procalcitonin less than 0.10 -Diuresed well initially with IV Lasix 40 mg IV. -Despite higher doses of IV Lasix, diuresis was not adequate.  Patient was requiring 15 L of supplemental oxygen.  Chest x-ray revealed pleural effusion and pulmonary edema.  Nephrology team transfer patient to ICU for HD catheter placement. -Patient underwent bilateral thoracentesis with significant decrease in supplemental oxygen requirement. -Patient was treated with metolazone overnight.  Significant diuresis noted.  Shortness of breath has improved. -Patient is currently on 4 to 5 L of supplemental oxygen via nasal cannula.  Patient's symptoms are also improving. -Input from pulmonary team/ICU team and nephrology team is highly appreciated.  Pulmonary edema/acute on chronic diastolic heart failure: -Patient has diastolic heart failure.   -BNP was elevated.   -Patient has advanced chronic kidney disease (CKD 4).   -Significant diuresis with metolazone.  Hypokalemia noted.  Already repleted by the nephrology team.   -Repeat chest x-ray today.  -Echocardiogram revealed EF 55 to 60%, mild LVH, left ventricular diastolic parameters indeterminate and RVSP of 46.4 mmHg.  CKD stage IV -Serum creatinine is 2.92 today. -Nephrology following  Pneumonia: -Chest x-ray showed bibasilar patchy opacities, concern for pneumonia -Received 1 dose of Rocephin and Zithromax in the ED -Antibiotics were discontinued -Procalcitonin less than  0.10  Abdominal pain -Resolved -Patient reported constipation.   -Continue  MiraLAX 17 g p.o. daily  Diabetes mellitus type 2: -Glimepiride on hold -Continue to monitor blood sugar.  History of CAD: -Continue Plavix  Hypertension: -Continue Coreg; amlodipine on hold -Blood pressure is stable  Abdominal fullness: -Poor historian -Patient is a diabetic. -Consider gastric emptying studies.   Medications     atorvastatin  80 mg Oral Daily   carvedilol  25 mg Oral BID WC   Chlorhexidine Gluconate Cloth  6 each Topical Q0600   clopidogrel  75 mg Oral Daily   diphenhydrAMINE  25 mg Intravenous Once   fluticasone  2 spray Each Nare Daily   guaiFENesin  600 mg Oral BID   heparin  5,000 Units Subcutaneous Q8H   insulin aspart  0-9 Units Subcutaneous TID WC   oxymetazoline  1 spray Each Nare Once   polyethylene glycol  17 g Oral TID   potassium chloride  40 mEq Oral Q4H   sodium chloride flush  3 mL Intravenous Q12H     Data Reviewed:   CBG:  Recent Labs  Lab 05/07/23 2103 05/08/23 0603 05/08/23 1128 05/08/23 1535 05/09/23 0742  GLUCAP 208* 115* 157* 128* 127*     SpO2: 98 % O2 Flow Rate (L/min): 7 L/min FiO2 (%): 70 %    Vitals:   05/09/23 0500 05/09/23 0600 05/09/23 0700 05/09/23 0800  BP: (!) 156/68 (!) 156/71 (!) 152/65 (!) 145/67  Pulse: 63 65 69 76  Resp: (!) 32 (!) 25 16 19   Temp:      TempSrc:      SpO2: 97% 98% 97% 98%  Weight:      Height:          Data Reviewed:  Basic Metabolic Panel: Recent Labs  Lab 05/06/23 0518 05/07/23 0532 05/08/23 0636 05/08/23 1448 05/09/23 0501  NA 133* 133* 134* 134* 135  K 3.8 3.9 3.9 3.5 3.0*  CL 101 99 98 97* 93*  CO2 23 25 26 26 28   GLUCOSE 123* 69* 118* 136* 114*  BUN 29* 35* 35* 37* 38*  CREATININE 2.63* 2.83* 2.90* 2.99* 2.92*  CALCIUM 8.3* 8.4* 8.5* 8.7* 8.9  MG  --   --   --   --  2.1  PHOS  --   --  5.3*  --  5.2*     CBC: Recent Labs  Lab 05/05/23 0927 05/06/23 0518  05/07/23 0532 05/09/23 0501  WBC 7.5 9.2 7.0 7.9  HGB 10.6* 9.2* 8.9* 9.4*  HCT 31.5* 28.2* 27.3* 27.4*  MCV 92.9 95.3 95.8 90.4  PLT 390 341 285 327     LFT Recent Labs  Lab 05/05/23 0927 05/06/23 0518 05/07/23 0532 05/08/23 0636 05/09/23 0501  AST 21 21 27   --   --   ALT 17 17 22   --   --   ALKPHOS 67 61 63  --   --   BILITOT 0.8 0.5 0.7  --   --   PROT 7.0 6.5 6.4*  --   --   ALBUMIN 3.6 3.3* 3.1* 3.0* 3.0*      Antibiotics: Anti-infectives (From admission, onward)    Start     Dose/Rate Route Frequency Ordered Stop   05/08/23 1000  ceFAZolin (ANCEF) IVPB 2g/100 mL premix        2 g 200 mL/hr over 30 Minutes Intravenous To Radiology 05/07/23 1537 05/09/23 1000   05/05/23 1515  cefTRIAXone (ROCEPHIN) 1 g in sodium chloride 0.9 % 100 mL IVPB  1 g 200 mL/hr over 30 Minutes Intravenous  Once 05/05/23 1510 05/05/23 1642   05/05/23 1515  azithromycin (ZITHROMAX) 500 mg in sodium chloride 0.9 % 250 mL IVPB        500 mg 250 mL/hr over 60 Minutes Intravenous  Once 05/05/23 1510 05/05/23 1750        DVT prophylaxis: Heparin  Code Status: Full code  Family Communication:    CONSULTS    Objective    Physical Examination: General condition: Patient is obese.  Not in any obvious distress.  Awake and alert. Heart-S1-S2, regular Lungs-patient only take shallow breaths.  Abdomen-obese, soft and nontender. Extremities-no edema in the lower extremities Neuro- Awake and alert.  Patient moves all extremities.    Status is: Inpatient:     Time spent: 55 minutes  Barnetta Chapel   [facility] If 7PM-7AM, please contact night-coverage at [email or web address], Office  [phone]   05/09/2023, 9:15 AM  LOS: 4 days

## 2023-05-10 DIAGNOSIS — Z8673 Personal history of transient ischemic attack (TIA), and cerebral infarction without residual deficits: Secondary | ICD-10-CM

## 2023-05-10 DIAGNOSIS — E118 Type 2 diabetes mellitus with unspecified complications: Secondary | ICD-10-CM

## 2023-05-10 DIAGNOSIS — Z91148 Patient's other noncompliance with medication regimen for other reason: Secondary | ICD-10-CM

## 2023-05-10 DIAGNOSIS — Z794 Long term (current) use of insulin: Secondary | ICD-10-CM

## 2023-05-10 DIAGNOSIS — N179 Acute kidney failure, unspecified: Secondary | ICD-10-CM

## 2023-05-10 LAB — CBC
HCT: 30.6 % — ABNORMAL LOW (ref 36.0–46.0)
Hemoglobin: 10.6 g/dL — ABNORMAL LOW (ref 12.0–15.0)
MCH: 31.5 pg (ref 26.0–34.0)
MCHC: 34.6 g/dL (ref 30.0–36.0)
MCV: 90.8 fL (ref 80.0–100.0)
Platelets: 366 10*3/uL (ref 150–400)
RBC: 3.37 MIL/uL — ABNORMAL LOW (ref 3.87–5.11)
RDW: 12.5 % (ref 11.5–15.5)
WBC: 12.1 10*3/uL — ABNORMAL HIGH (ref 4.0–10.5)
nRBC: 0 % (ref 0.0–0.2)

## 2023-05-10 LAB — RENAL FUNCTION PANEL
Albumin: 3.1 g/dL — ABNORMAL LOW (ref 3.5–5.0)
Anion gap: 16 — ABNORMAL HIGH (ref 5–15)
BUN: 48 mg/dL — ABNORMAL HIGH (ref 8–23)
CO2: 29 mmol/L (ref 22–32)
Calcium: 9 mg/dL (ref 8.9–10.3)
Chloride: 86 mmol/L — ABNORMAL LOW (ref 98–111)
Creatinine, Ser: 3.1 mg/dL — ABNORMAL HIGH (ref 0.44–1.00)
GFR, Estimated: 16 mL/min — ABNORMAL LOW (ref >60)
Glucose, Bld: 228 mg/dL — ABNORMAL HIGH (ref 70–99)
Phosphorus: 5.6 mg/dL — ABNORMAL HIGH (ref 2.5–4.6)
Potassium: 3.3 mmol/L — ABNORMAL LOW (ref 3.5–5.1)
Sodium: 131 mmol/L — ABNORMAL LOW (ref 135–145)

## 2023-05-10 LAB — GLUCOSE, CAPILLARY
Glucose-Capillary: 162 mg/dL — ABNORMAL HIGH (ref 70–99)
Glucose-Capillary: 278 mg/dL — ABNORMAL HIGH (ref 70–99)
Glucose-Capillary: 212 mg/dL — ABNORMAL HIGH (ref 70–99)
Glucose-Capillary: 311 mg/dL — ABNORMAL HIGH (ref 70–99)

## 2023-05-10 LAB — GLOMERULAR BASEMENT MEMBRANE ANTIBODIES: GBM Ab: <0.2 units (ref 0.0–0.9)

## 2023-05-10 LAB — CULTURE, BLOOD (ROUTINE X 2): Special Requests: ADEQUATE

## 2023-05-10 LAB — UREA NITROGEN, URINE: Urea Nitrogen, Ur: 370 mg/dL

## 2023-05-10 MED ORDER — FUROSEMIDE 10 MG/ML IJ SOLN
80.0000 mg | Freq: Two times a day (BID) | INTRAMUSCULAR | Status: DC
  Administered 2023-05-10 – 2023-05-21 (×18): 80 mg via INTRAVENOUS
  Filled 2023-05-10 (×18): qty 8

## 2023-05-10 MED ORDER — BISACODYL 10 MG RE SUPP
10.0000 mg | Freq: Every day | RECTAL | Status: DC
  Administered 2023-05-10 – 2023-05-24 (×5): 10 mg via RECTAL
  Filled 2023-05-10 (×9): qty 1

## 2023-05-10 MED ORDER — OXYCODONE HCL 5 MG PO TABS
5.0000 mg | ORAL_TABLET | ORAL | Status: DC | PRN
  Administered 2023-05-17 – 2023-05-25 (×7): 5 mg via ORAL
  Filled 2023-05-10 (×8): qty 1

## 2023-05-10 MED ORDER — INSULIN ASPART 100 UNIT/ML IJ SOLN
0.0000 [IU] | Freq: Every day | INTRAMUSCULAR | Status: DC
  Administered 2023-05-10: 4 [IU] via SUBCUTANEOUS
  Administered 2023-05-11: 3 [IU] via SUBCUTANEOUS
  Administered 2023-05-12 – 2023-05-13 (×2): 2 [IU] via SUBCUTANEOUS
  Administered 2023-05-15: 5 [IU] via SUBCUTANEOUS
  Administered 2023-05-17: 3 [IU] via SUBCUTANEOUS

## 2023-05-10 MED ORDER — INSULIN ASPART 100 UNIT/ML IJ SOLN
0.0000 [IU] | Freq: Three times a day (TID) | INTRAMUSCULAR | Status: DC
  Administered 2023-05-10 – 2023-05-11 (×2): 5 [IU] via SUBCUTANEOUS
  Administered 2023-05-11: 3 [IU] via SUBCUTANEOUS
  Administered 2023-05-11 – 2023-05-12 (×3): 5 [IU] via SUBCUTANEOUS
  Administered 2023-05-12: 11 [IU] via SUBCUTANEOUS
  Administered 2023-05-13: 3 [IU] via SUBCUTANEOUS
  Administered 2023-05-13: 2 [IU] via SUBCUTANEOUS
  Administered 2023-05-13: 5 [IU] via SUBCUTANEOUS
  Administered 2023-05-14: 3 [IU] via SUBCUTANEOUS
  Administered 2023-05-14: 8 [IU] via SUBCUTANEOUS
  Administered 2023-05-15: 2 [IU] via SUBCUTANEOUS
  Administered 2023-05-15: 5 [IU] via SUBCUTANEOUS
  Administered 2023-05-15 – 2023-05-16 (×3): 3 [IU] via SUBCUTANEOUS
  Administered 2023-05-16: 5 [IU] via SUBCUTANEOUS
  Administered 2023-05-17: 3 [IU] via SUBCUTANEOUS
  Administered 2023-05-17 – 2023-05-18 (×3): 2 [IU] via SUBCUTANEOUS
  Administered 2023-05-18: 5 [IU] via SUBCUTANEOUS
  Administered 2023-05-19 (×2): 3 [IU] via SUBCUTANEOUS
  Administered 2023-05-19: 5 [IU] via SUBCUTANEOUS
  Administered 2023-05-20 – 2023-05-21 (×4): 3 [IU] via SUBCUTANEOUS
  Administered 2023-05-22 (×2): 5 [IU] via SUBCUTANEOUS
  Administered 2023-05-23: 3 [IU] via SUBCUTANEOUS
  Administered 2023-05-23: 8 [IU] via SUBCUTANEOUS
  Administered 2023-05-23 – 2023-05-24 (×2): 3 [IU] via SUBCUTANEOUS
  Administered 2023-05-24: 2 [IU] via SUBCUTANEOUS
  Administered 2023-05-25 – 2023-05-26 (×2): 3 [IU] via SUBCUTANEOUS
  Administered 2023-05-26: 2 [IU] via SUBCUTANEOUS
  Administered 2023-05-27: 3 [IU] via SUBCUTANEOUS
  Administered 2023-05-28: 2 [IU] via SUBCUTANEOUS

## 2023-05-10 MED ORDER — DOCUSATE SODIUM 100 MG PO CAPS
100.0000 mg | ORAL_CAPSULE | Freq: Two times a day (BID) | ORAL | Status: DC
  Administered 2023-05-10 – 2023-05-28 (×19): 100 mg via ORAL
  Filled 2023-05-10 (×24): qty 1

## 2023-05-10 MED ORDER — POLYETHYLENE GLYCOL 3350 17 G PO PACK
17.0000 g | PACK | Freq: Two times a day (BID) | ORAL | Status: AC
  Administered 2023-05-10 – 2023-05-12 (×4): 17 g via ORAL
  Filled 2023-05-10 (×5): qty 1

## 2023-05-10 MED ORDER — POTASSIUM CHLORIDE CRYS ER 20 MEQ PO TBCR
40.0000 meq | EXTENDED_RELEASE_TABLET | Freq: Once | ORAL | Status: AC
  Administered 2023-05-10: 40 meq via ORAL
  Filled 2023-05-10: qty 2

## 2023-05-10 NOTE — Progress Notes (Signed)
Interventional Radiology Brief Note:  Discussed case with Dr. Signe Colt.  Patient has a temp HD cath placed by CCM 5/11, however has not needed ongoing HD as previously expected.  Currently making significant urine.  Tunn HD catheter not needed at this time. Order cancelled.  IR remains available.   Loyce Dys, MS RD PA-C

## 2023-05-10 NOTE — Hospital Course (Addendum)
Patient is a 72 year old female with past medical history of PAD, hypertension, type 2 diabetes, history of CVA, depression and anxiety was recently discharged from the hospital on 04/18/23 after treatment for UTI/pyelonephritis, pulmonary edema and was given prescription for Lasix for 7 days.  This time again presented with worsening abdominal discomfort chest discomfort and chills. CT chest abdomen/pelvis was done which showed pulmonary edema versus bibasilar infiltrates.  BNP was elevated to 600.  Patient had initial persistent pulmonary edema, acute hypoxic respiratory failure requiring 15 L of supplemental oxygen, was transferred to the ICU.  During hospital stay patient underwent bilateral thoracocentesis with removal of1 L of pleural fluid from the left and 1.5 L from the right.  2D echocardiogram showed diastolic dysfunction and moderate pulmonary hypertension.  Patient was also seen by nephrology during hospitalization and was subsequently considered stable for transfer out of the ICU.      Acute hypoxemic respiratory failure: Secondary pulmonary edema, bilateral pleural effusion, BNP elevated 601.  procalcitonin less than 0.10.  Continue IV Lasix high-dose..  Was initially on 15 L of oxygen but has been weaned down to 3 L/min.  Status post bilateral thoracocentesis.  Patient had hemodialysis catheter placed in by nephrology.  Getting metolazone with significant diuresis.  Pleural fluid analysis transudative in nature.  Mild hypokalemia.  Will replace orally.  Check BMP in AM.  Mild hyponatremia will continue to monitor closely.  Nephrology on board.   Pulmonary edema/acute on chronic diastolic heart failure: Nephrology on board.  Has been diuresing.Review of recent echocardiogram revealed EF 55 to 60%, mild LVH, left ventricular diastolic parameters indeterminate and RVSP of 46.4 mmHg.  Negative balance for 7423 ml.  On Lasix high-dose twice a day.   AKI on CKD stage IIIb. Baseline creatinine  around 1.7.  Creatinine at 3.1 today.  Nephrology following.  Follow recommendation.  On Lasix high-dose twice a day.  Concern for pneumonia: -Chest x-ray showed bibasilar patchy opacities, concern for pneumonia -Received 1 dose of Rocephin and Zithromax in the ED.  Procalcitonin less than 0.1.  Antibiotics discontinued.  Blood cultures negative in 5 days.   Abdominal pain, abdominal fullness Pain secondary to constipation.  Continue bowel regimen.  History of diabetes.  Might need gastric emptying study as outpatient.   Diabetes mellitus type 2: Continue sliding scale insulin Accu-Cheks.  Glimepiride on hold   History of CAD: -Continue Plavix   Hypertension: Continue Coreg.  Hold amlodipine.  Latest blood pressure 141/67

## 2023-05-10 NOTE — Consult Note (Signed)
[facility] Progress Note   Assessment/ Plan:   Samantha [name] is a/an 72 y.o. female with a past medical history significant for HTN, DM2, PAD, CVA, HLD, CKD 3B who presents with shortness of breath and concern for volume overload   The patient was recently hospitalized prior to this hospitalization for E. coli UTI and pyelonephritis.  She also was taking an ARB as well as NSAIDs at home.  She had an AKI and was given IV fluids.  She developed some volume overload and IV fluids were stopped.  Her creatinine was 2.9 at discharge.  When she read presented for this hospitalization her creatinine was 2.4.  She was taking Lasix 40 mg daily but had ran out a couple days before arriving.  Overall her symptoms are fairly nonspecific.  Since admission she has responded poorly to IV diuresis.  She was transferred from [facility] to [facility] due to likely need for dialysis.       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  Creatinine up to 2.9 at this time.   -Temporary catheter placed by CCM on 5/11, appreciate help - looks like she is improving with UOP-- will reduce Lasix to 80 mg for this evening after having 160 mg this AM - adding fluid restriction -Continue to monitor daily Cr, Dose meds for GFR -Monitor Daily I/Os, Daily weight  -Maintain MAP>65 for optimal renal perfusion.  -Avoid nephrotoxic medications including NSAIDs -Use synthetic opioids (Fentanyl/Dilaudid) if needed -Serologies have been sent to workup for possible glomerular disease but seems unlikely given no significant hematuria.  Proteinuria is most likely related to her history of diabetes and recent tubular injury.   Acute hypoxic respiratory failure.  Most likely related to volume overload.  Appreciate help from pulmonary critical care team.  Seems to be improving after thoracentesis and diuresis.  Continue with diuresis as above   Volume overload: Mostly  as above.  Of note, ejection fraction is normal.  Echocardiogram did demonstrate that IVC was distended.  She seems to be improving with diuresis as well as thoracentesis.  Continue diuretics as above   Uncontrolled Diabetes Mellitus Type 2 with Hyperglycemia: Management per primary team   HTN: Medications per primary on carvedilol   Metabolic acidosis: Resolved.   Abdominal distention/fullness: Possible constipation.  Titration of medications per primary team  Subjective:    Seen in room, dtr at bedside.  Has increased UOP- 5L on Sat and nearly 3 on Sunday.  Cr slightly upticking.  CT chest with improved pleural effusions but still some pulm edema.     Objective:   BP 138/67 (BP Location: Left Arm)   Pulse 72   Temp 99.1 F (37.3 C) (Oral)   Resp 14   Ht 5\' 5"  (1.651 m)   Wt 73.7 kg   SpO2 92%   BMI 27.04 kg/m   Physical Exam: Gen:NAD, on O2 CVS:RRR Resp: normal WOB, doesn't sound terrible Abd: soft Ext: no LE edema ACCESS: R IJ nontunneled HD catheter  Labs: BMET Recent Labs  Lab 05/05/23 0927 05/06/23 0518 05/07/23 0532 05/08/23 0636 05/08/23 1448 05/09/23 0501 05/10/23 0536  NA 133* 133* 133* 134* 134* 135 131*  K 3.4* 3.8 3.9 3.9 3.5 3.0* 3.3*  CL 102 101 99 98 97* 93* 86*  CO2 20* 23 25 26 26 28 29   GLUCOSE 201* 123* 69* 118* 136* 114* 228*  BUN 25* 29* 35* 35* 37*  38* 48*  CREATININE 2.34* 2.63* 2.83* 2.90* 2.99* 2.92* 3.10*  CALCIUM 8.7* 8.3* 8.4* 8.5* 8.7* 8.9 9.0  PHOS  --   --   --  5.3*  --  5.2* 5.6*   CBC Recent Labs  Lab 05/06/23 0518 05/07/23 0532 05/09/23 0501 05/10/23 0536  WBC 9.2 7.0 7.9 12.1*  HGB 9.2* 8.9* 9.4* 10.6*  HCT 28.2* 27.3* 27.4* 30.6*  MCV 95.3 95.8 90.4 90.8  PLT 341 285 327 366      Medications:     atorvastatin  80 mg Oral Daily   bisacodyl  10 mg Rectal Daily   carvedilol  25 mg Oral BID WC   Chlorhexidine Gluconate Cloth  6 each Topical Q0600   clopidogrel  75 mg Oral Daily   diphenhydrAMINE  25 mg  Intravenous Once   docusate sodium  100 mg Oral BID   fluticasone  2 spray Each Nare Daily   furosemide  80 mg Intravenous BID   guaiFENesin  600 mg Oral BID   heparin  5,000 Units Subcutaneous Q8H   insulin aspart  0-15 Units Subcutaneous TID WC   insulin aspart  0-5 Units Subcutaneous QHS   oxymetazoline  1 spray Each Nare Once   polyethylene glycol  17 g Oral BID   sodium chloride flush  3 mL Intravenous Q12H     Bufford Buttner, MD 05/10/2023, 1:16 PM

## 2023-05-10 NOTE — TOC Progression Note (Signed)
Transition of Care [facility]) - Progression Note    Patient Details  Name: Samantha [name] MRN: [id number] Date of Birth: 23-Mar-1951  [facility]) CM/SW Contact  Tom-Johnson, Hershal Coria, RN Phone Number: 05/10/2023, 1:00 PM  Clinical Narrative:     Patient admitted for worsening Abdominal discomfort, Chest discomfort and Chills. Found to have Acute Hypoxemic Respiratory Failure, on room air.  Patient was recently discharged from the hospital on 04/18/23 for UTI/Pyelonephritis and Pulmonary Edema. Discharge with prescription for Lasix for 7 days.   CT Chest/Abdomen/Pelvis showed Pulmonary Edema v/s Bibasilar Infiltrates.  Patient is s/p Bilateral Thoracocentesis.   Nephrology following. No family at bedside and patient resting. Unable to assess at this time. CM will continue to follow as patient progresses with care towards discharge.        Expected Discharge Plan: Acute to Acute Transfer Barriers to Discharge: Continued Medical Work up  Expected Discharge Plan and Services   Discharge Planning Services: CM Consult   Living arrangements for the past 2 months: Single Family Home                                       Social Determinants of Health (SDOH) Interventions SDOH Screenings   Food Insecurity: No Food Insecurity (05/05/2023)  Housing: Low Risk  (05/05/2023)  Transportation Needs: No Transportation Needs (05/05/2023)  Utilities: Not At Risk (05/05/2023)  Depression (PHQ2-9): Low Risk  (11/19/2021)  Tobacco Use: Low Risk  (05/05/2023)    Readmission Risk Interventions     No data to display

## 2023-05-10 NOTE — Progress Notes (Signed)
PROGRESS NOTE    Samantha [name]  AVW:[id number] DOB: Feb 25, 1951 DOA: 05/05/2023 PCP: Hoy Register, MD    Brief Narrative:  Patient is a 72 year old female with past medical history of PAD, hypertension, type 2 diabetes, history of CVA, depression and anxiety was recently discharged from the hospital on 04/18/23 after treatment for UTI/pyelonephritis, pulmonary edema and was given prescription for Lasix for 7 days.  This time again presented with worsening abdominal discomfort chest discomfort and chills. CT chest abdomen/pelvis was done which showed pulmonary edema versus bibasilar infiltrates.  BNP was elevated to 600.  Patient had initial persistent pulmonary edema, acute hypoxic respiratory failure requiring 15 L of supplemental oxygen, was transferred to the ICU.  During hospital stay, patient underwent bilateral thoracocentesis with removal of1 L of pleural fluid from the left and 1.5 L from the right.  2D echocardiogram showed diastolic dysfunction and moderate pulmonary hypertension.  Patient was also seen by nephrology during hospitalization was put on high-dose IV Lasix and was subsequently considered stable for transfer out of the ICU.     Assessment and plan.  Acute hypoxemic respiratory failure: Secondary pulmonary edema, bilateral pleural effusion, BNP elevated 601.  procalcitonin less than 0.10.  Continue IV Lasix high-dose..  Was initially on 15 L of oxygen but has been weaned down to 3 L/min.  Status post bilateral thoracocentesis.  Patient had hemodialysis catheter placed in by nephrology.  Patient has received IV Lasix high-dose which has been decreased to 80 twice daily at this time.  Pleural fluid analysis transudative in nature.  Mild hypokalemia.  Will replace orally.  Check BMP in AM.  Mild hyponatremia will continue to monitor closely.  Nephrology on board.   Pulmonary edema/acute on chronic diastolic heart failure: Nephrology on board.  Has been diuresing. Review of  recent echocardiogram revealed EF 55 to 60%, mild LVH, left ventricular diastolic parameters indeterminate and RVSP of 46.4 mmHg.  Negative balance for 7423 ml.  On IV Lasix 80 mg twice daily.  Nephrology following.   AKI on CKD stage IIIb. Baseline creatinine around 1.7.  Creatinine at 3.1 today.  Nephrology following.  Follow recommendation.  On IV Lasix.  Status post hemodialysis catheter placement but no plans for hemodialysis yet.  Lasix dose has been decreased to 80 milligram IV twice daily.   Concern for pneumonia: -Chest x-ray showed bibasilar patchy opacities, concern for pneumonia. Received 1 dose of Rocephin and Zithromax in the ED.  Procalcitonin less than 0.1.  Antibiotics discontinued.  Blood cultures negative in 5 days.   Abdominal pain, abdominal fullness, right loin pain. Pain secondary to constipation.  Continue bowel regimen.  History of diabetes.  Will continue on aggressive bowel regimen.  Patient needed enema and manual disimpaction yesterday.  Will put the patient on MiraLAX twice daily and add Dulcolax suppository daily.  Urinalysis 5 days back without any evidence of infection.  Diabetes mellitus type 2: Continue sliding scale insulin Accu-Cheks.  Glimepiride on hold   History of CAD: -Continue Plavix   Hypertension: Continue Coreg.  Hold amlodipine.  Latest blood pressure 141/67    DVT prophylaxis: heparin injection 5,000 Units Start: 05/09/23 0600   Code Status:     Code Status: Full Code  Disposition: Uncertain at this time.  Likely home   Status is: Inpatient Remains inpatient appropriate because: Volume overload, IV Lasix, electrolyte imbalance,   Family Communication: Spoke with the patient's daughter at bedside.  Consultants:  Nephrology.  Procedures:  Right chest wall hemodialysis catheter placement.  Antimicrobials:  None  Anti-infectives (From admission, onward)    Start     Dose/Rate Route Frequency Ordered Stop   05/08/23 1000   ceFAZolin (ANCEF) IVPB 2g/100 mL premix        2 g 200 mL/hr over 30 Minutes Intravenous To Radiology 05/07/23 1537 05/09/23 1000   05/05/23 1515  cefTRIAXone (ROCEPHIN) 1 g in sodium chloride 0.9 % 100 mL IVPB        1 g 200 mL/hr over 30 Minutes Intravenous  Once 05/05/23 1510 05/05/23 1642   05/05/23 1515  azithromycin (ZITHROMAX) 500 mg in sodium chloride 0.9 % 250 mL IVPB        500 mg 250 mL/hr over 60 Minutes Intravenous  Once 05/05/23 1510 05/05/23 1750        Subjective: Today, patient was seen and examined at bedside.  Patient complains of mild right loin discomfort.  Had constipation yesterday and had to be manually disimpacted with enema.  No nausea vomiting.  Denies any shortness of breath or dyspnea, fever or chills.  Patient's daughter at bedside.  Objective: Vitals:   05/10/23 1046 05/10/23 1105 05/10/23 1153 05/10/23 1200  BP:    138/67  Pulse: 74  74 72  Resp: 13  16 14   Temp:  99.1 F (37.3 C)    TempSrc:  Oral    SpO2: 96%  95% 92%  Weight:      Height:        Intake/Output Summary (Last 24 hours) at 05/10/2023 1211 Last data filed at 05/10/2023 1145 Gross per 24 hour  Intake 545.51 ml  Output 2550 ml  Net -2004.49 ml   Filed Weights   05/08/23 1329  Weight: 73.7 kg    Physical Examination: Body mass index is 27.04 kg/m.  General:  Average built, not in obvious distress, on nasal cannula oxygen HENT:   No scleral pallor or icterus noted. Oral mucosa is moist.  Chest: .  Diminished breath sounds bilaterally.  No obvious crackles noted. CVS: S1 &S2 heard. No murmur.  Regular rate and rhythm. Abdomen: Soft, nontender, nondistended.  Bowel sounds are heard.  Tenderness over the right upper quadrant, loin Extremities: No cyanosis, clubbing or edema.  Peripheral pulses are palpable. Psych: Alert, awake and oriented, normal mood CNS:  No cranial nerve deficits.  Power equal in all extremities.   Skin: Warm and dry.  No rashes noted.  Data Reviewed:    CBC: Recent Labs  Lab 05/05/23 0927 05/06/23 0518 05/07/23 0532 05/09/23 0501 05/10/23 0536  WBC 7.5 9.2 7.0 7.9 12.1*  HGB 10.6* 9.2* 8.9* 9.4* 10.6*  HCT 31.5* 28.2* 27.3* 27.4* 30.6*  MCV 92.9 95.3 95.8 90.4 90.8  PLT 390 341 285 327 366    Basic Metabolic Panel: Recent Labs  Lab 05/07/23 0532 05/08/23 0636 05/08/23 1448 05/09/23 0501 05/10/23 0536  NA 133* 134* 134* 135 131*  K 3.9 3.9 3.5 3.0* 3.3*  CL 99 98 97* 93* 86*  CO2 25 26 26 28 29   GLUCOSE 69* 118* 136* 114* 228*  BUN 35* 35* 37* 38* 48*  CREATININE 2.83* 2.90* 2.99* 2.92* 3.10*  CALCIUM 8.4* 8.5* 8.7* 8.9 9.0  MG  --   --   --  2.1  --   PHOS  --  5.3*  --  5.2* 5.6*    Liver Function Tests: Recent Labs  Lab 05/05/23 0927 05/06/23 0518 05/07/23 0532 05/08/23 0636 05/09/23 0501 05/10/23 0536  AST 21 21 27   --   --   --  ALT 17 17 22   --   --   --   ALKPHOS 67 61 63  --   --   --   BILITOT 0.8 0.5 0.7  --   --   --   PROT 7.0 6.5 6.4*  --   --   --   ALBUMIN 3.6 3.3* 3.1* 3.0* 3.0* 3.1*     Radiology Studies: CT CHEST WO CONTRAST  Result Date: 05/09/2023 CLINICAL DATA:  Chronic dyspnea.  Chest pain. EXAM: CT CHEST WITHOUT CONTRAST TECHNIQUE: Multidetector CT imaging of the chest was performed following the standard protocol without IV contrast. RADIATION DOSE REDUCTION: This exam was performed according to the departmental dose-optimization program which includes automated exposure control, adjustment of the mA and/or kV according to patient size and/or use of iterative reconstruction technique. COMPARISON:  CT chest abdomen and pelvis 05/05/2023 FINDINGS: Cardiovascular: Heart is enlarged. There is no pericardial effusion. Aorta is normal in size. There are atherosclerotic calcifications of the aorta and coronary arteries. Right-sided central venous catheter tip ends in the SVC. Mediastinum/Nodes: There is a subcentimeter left hypodense thyroid nodule. There are no enlarged mediastinal, hilar  or axillary lymph nodes. The esophagus is within normal limits. Lungs/Pleura: There are small bilateral pleural effusions which have decreased when compared to the prior examination. There are interstitial and patchy ground-glass opacities in both mid and lower lungs involving the lower lobes, right middle lobe and inferior left upper lobe. These have increased when compared to the prior examination. Trachea and central airways are patent. There is no pneumothorax. Upper Abdomen: No acute abnormality. Musculoskeletal: No chest wall mass or suspicious bone lesions identified. IMPRESSION: 1. Decreasing small bilateral pleural effusions. 2. Increasing interstitial and patchy ground-glass opacities in the mid and lower lungs. Findings are nonspecific and may be related to pulmonary edema or infection. 3. Stable cardiomegaly. 4. Subcentimeter left thyroid nodule. Not clinically significant; no follow-up imaging recommended (ref: J Am Coll Radiol. 2015 Feb;12(2): 143-50). Aortic Atherosclerosis (ICD10-I70.0). Electronically Signed   By: Darliss Cheney M.D.   On: 05/09/2023 23:37   DG CHEST PORT 1 VIEW  Result Date: 05/09/2023 CLINICAL DATA:  Shortness of breath EXAM: PORTABLE CHEST 1 VIEW COMPARISON:  Chest x-ray May 08, 2023 FINDINGS: No pneumothorax. Stable right central line. Mild opacity in left base is stable. Opacity in the right base is worsened in the interval. No change in cardiomegaly or cardiomediastinal silhouette. IMPRESSION: 1. Worsening opacity in the right base could represent developing pneumonia/infiltrate. Recommend attention on follow-up 2. Stable mild opacity in the left base. 3. No other changes. Electronically Signed   By: Gerome Sam III M.D.   On: 05/09/2023 10:29   DG Chest Port 1 View  Result Date: 05/08/2023 CLINICAL DATA:  Evaluate central line placement EXAM: PORTABLE CHEST 1 VIEW COMPARISON:  Chest x-ray May 06, 2023 FINDINGS: A new right central line has been placed terminating in  the central SVC. No pneumothorax. Effusion and opacity on the left is less pronounced. Effusion and opacity on the right is less pronounced as well. There is some opacity in the lateral right lung base. No other interval changes or acute abnormalities. IMPRESSION: 1. The new right central line terminates in the central SVC. No pneumothorax. 2. Effusions and opacities in the lung bases are less pronounced. There is some opacity in the lateral right lung base. Electronically Signed   By: Gerome Sam III M.D.   On: 05/08/2023 15:18      LOS: 5 days  Joycelyn Das, MD [facility] Available via Epic secure chat 7am-7pm After these hours, please refer to coverage provider listed on [email or web address] 05/10/2023, 12:11 PM

## 2023-05-11 ENCOUNTER — Inpatient Hospital Stay (HOSPITAL_COMMUNITY): Payer: Medicaid Other

## 2023-05-11 LAB — RENAL FUNCTION PANEL
Albumin: 3.3 g/dL — ABNORMAL LOW (ref 3.5–5.0)
Anion gap: 13 (ref 5–15)
BUN: 51 mg/dL — ABNORMAL HIGH (ref 8–23)
CO2: 30 mmol/L (ref 22–32)
Calcium: 9.1 mg/dL (ref 8.9–10.3)
Chloride: 86 mmol/L — ABNORMAL LOW (ref 98–111)
Creatinine, Ser: 3.38 mg/dL — ABNORMAL HIGH (ref 0.44–1.00)
GFR, Estimated: 14 mL/min — ABNORMAL LOW (ref >60)
Glucose, Bld: 203 mg/dL — ABNORMAL HIGH (ref 70–99)
Phosphorus: 5.7 mg/dL — ABNORMAL HIGH (ref 2.5–4.6)
Potassium: 3.5 mmol/L (ref 3.5–5.1)
Sodium: 129 mmol/L — ABNORMAL LOW (ref 135–145)

## 2023-05-11 LAB — ANCA PROFILE
Anti-MPO Antibodies: <0.2 units (ref 0.0–0.9)
Anti-PR3 Antibodies: <0.2 units (ref 0.0–0.9)
Atypical P-ANCA titer: <1:20 {titer}
C-ANCA: <1:20 {titer}
P-ANCA: <1:20 {titer}

## 2023-05-11 LAB — CYTOLOGY - NON PAP

## 2023-05-11 LAB — CBC
HCT: 31 % — ABNORMAL LOW (ref 36.0–46.0)
Hemoglobin: 10.8 g/dL — ABNORMAL LOW (ref 12.0–15.0)
MCH: 31.4 pg (ref 26.0–34.0)
MCHC: 34.8 g/dL (ref 30.0–36.0)
MCV: 90.1 fL (ref 80.0–100.0)
Platelets: 353 10*3/uL (ref 150–400)
RBC: 3.44 MIL/uL — ABNORMAL LOW (ref 3.87–5.11)
RDW: 12.4 % (ref 11.5–15.5)
WBC: 7.4 10*3/uL (ref 4.0–10.5)
nRBC: 0 % (ref 0.0–0.2)

## 2023-05-11 LAB — MAGNESIUM: Magnesium: 2.2 mg/dL (ref 1.7–2.4)

## 2023-05-11 LAB — GLUCOSE, CAPILLARY
Glucose-Capillary: 183 mg/dL — ABNORMAL HIGH (ref 70–99)
Glucose-Capillary: 213 mg/dL — ABNORMAL HIGH (ref 70–99)
Glucose-Capillary: 202 mg/dL — ABNORMAL HIGH (ref 70–99)
Glucose-Capillary: 214 mg/dL — ABNORMAL HIGH (ref 70–99)
Glucose-Capillary: 258 mg/dL — ABNORMAL HIGH (ref 70–99)

## 2023-05-11 LAB — BODY FLUID CULTURE W GRAM STAIN: Culture: NO GROWTH

## 2023-05-11 MED ORDER — TECHNETIUM TC 99M SULFUR COLLOID FILTERED
2.1000 | Freq: Once | INTRAVENOUS | Status: AC | PRN
  Administered 2023-05-11: 2.1 via INTRADERMAL

## 2023-05-11 MED ORDER — INSULIN GLARGINE-YFGN 100 UNIT/ML ~~LOC~~ SOLN
12.0000 [IU] | Freq: Every day | SUBCUTANEOUS | Status: DC
  Administered 2023-05-11: 12 [IU] via SUBCUTANEOUS
  Filled 2023-05-11 (×2): qty 0.12

## 2023-05-11 MED ORDER — POTASSIUM CHLORIDE CRYS ER 20 MEQ PO TBCR
40.0000 meq | EXTENDED_RELEASE_TABLET | Freq: Once | ORAL | Status: AC
  Administered 2023-05-11: 40 meq via ORAL
  Filled 2023-05-11: qty 2

## 2023-05-11 NOTE — Progress Notes (Signed)
Pt transferred to 5W. V/S/S, pt's daughter at bedside.

## 2023-05-11 NOTE — Inpatient Diabetes Management (Signed)
Inpatient Diabetes Program Recommendations  AACE/ADA: New Consensus Statement on Inpatient Glycemic Control (2015)  Target Ranges:  Prepandial:   less than 140 mg/dL      Peak postprandial:   less than 180 mg/dL (1-2 hours)      Critically ill patients:  140 - 180 mg/dL   Lab Results  Component Value Date   GLUCAP 183 (H) 05/11/2023   HGBA1C 7.7 (H) 04/15/2023    Review of Glycemic Control  Latest Reference Range & Units 05/10/23 07:45 05/10/23 11:04 05/10/23 15:10 05/10/23 22:03 05/11/23 07:15  Glucose-Capillary 70 - 99 mg/dL 161 (H) 096 (H) 045 (H) 311 (H) 183 (H)  (H): Data is abnormally high Diabetes history: Type 2 DM Outpatient Diabetes medications: Amaryl 4 mg QD, Humulin 70/30-30 units A/26 units QP Current orders for Inpatient glycemic control: Novolog 0-15 units TID & HS  Inpatient Diabetes Program Recommendations:    Consider adding Semglee 8 units QD.   Thanks, Lujean Rave, MSN, RNC-OB Diabetes Coordinator [phone] (8a-5p)

## 2023-05-11 NOTE — Progress Notes (Signed)
PROGRESS NOTE    Beatryce Sistrunk  ZOX:[id number] DOB: [birth year] DOA: 05/05/2023 PCP: Hoy Register, MD    Brief Narrative:  Patient is a 72 year old female with past medical history of PAD, hypertension, type 2 diabetes, history of CVA, depression and anxiety was recently discharged from the hospital on 04/18/23 after treatment for UTI/pyelonephritis, pulmonary edema and was given prescription for Lasix for 7 days.  This time again presented with worsening abdominal discomfort chest discomfort and chills. CT chest abdomen/pelvis was done which showed pulmonary edema versus bibasilar infiltrates.  BNP was elevated to 600.  Patient had initial persistent pulmonary edema, acute hypoxic respiratory failure requiring 15 L of supplemental oxygen, was transferred to the ICU.  During hospital stay, patient underwent bilateral thoracocentesis with removal of1 L of pleural fluid from the left and 1.5 L from the right.  2D echocardiogram showed diastolic dysfunction and moderate pulmonary hypertension.  Patient was also seen by nephrology during hospitalization was put on high-dose IV Lasix and was subsequently considered stable for transfer out of the ICU.     Assessment and plan.  Acute hypoxemic respiratory failure Secondary pulmonary edema, bilateral pleural effusion, BNP elevated 601.  procalcitonin less than 0.10.  Continue IV Lasix high-dose..  Was initially on 15 L of oxygen but has been weaned down to room air. Status post bilateral thoracocentesis.  Patient had hemodialysis catheter placed in by nephrology.  Patient has received IV Lasix high-dose which has been decreased to 80 milligram twice daily at this time.  Pleural fluid analysis transudative in nature.  Mild hypokalemia.  Improved after replacement.  Will continue oral potassium.  Check BMP in AM  Mild hyponatremia will continue to monitor closely.  Nephrology on board.   Pulmonary edema/acute on chronic diastolic heart  failure: Nephrology on board.  Has been diuresing. Review of recent echocardiogram revealed EF 55 to 60%, mild LVH, left ventricular diastolic parameters indeterminate and RVSP of 46.4 mmHg.  Negative balance for 8545 ml.  On IV Lasix 80 mg twice daily.  Nephrology following.   AKI on CKD stage IIIb. Baseline creatinine around 1.7.  Creatinine at 3.3 from 3.1 yesterday.   On IV Lasix.  Status post hemodialysis catheter placement but no plans for hemodialysis yet.  Follow nephrology recommendations  Concern for pneumonia: -Chest x-ray showed bibasilar patchy opacities, concern for pneumonia. Received 1 dose of Rocephin and Zithromax in the ED.  Procalcitonin less than 0.1.  Antibiotics discontinued.  Blood cultures negative in 5 days.   Abdominal pain, abdominal fullness, right loin pain. secondary to constipation.  Continue bowel regimen.  History of diabetes.  Will continue on aggressive bowel regimen.  Patient needed enema and manual disimpaction initially.  Will continue with MiraLAX twice daily and add Dulcolax suppository daily.  Urinalysis 5 days back without any evidence of infection.  Diabetes mellitus type 2: Continue sliding scale insulin Accu-Cheks.  Glimepiride on hold   History of CAD: -Continue Plavix   Hypertension: Continue Coreg.  Hold amlodipine.  Latest blood pressure 122/55    DVT prophylaxis: heparin injection 5,000 Units Start: 05/09/23 0600   Code Status:     Code Status: Full Code  Disposition: Home likely in 1 to 2 days/when okay with nephrology.  Status is: Inpatient Remains inpatient appropriate because: Volume overload, IV Lasix, electrolyte imbalance,   Family Communication:  Spoke with the patient's daughter at bedside.  Consultants:  Nephrology.  Procedures:  Right chest wall hemodialysis catheter placement.  Antimicrobials:  None  Anti-infectives (From  admission, onward)    Start     Dose/Rate Route Frequency Ordered Stop   05/08/23 1000   ceFAZolin (ANCEF) IVPB 2g/100 mL premix        2 g 200 mL/hr over 30 Minutes Intravenous To Radiology 05/07/23 1537 05/09/23 1000   05/05/23 1515  cefTRIAXone (ROCEPHIN) 1 g in sodium chloride 0.9 % 100 mL IVPB        1 g 200 mL/hr over 30 Minutes Intravenous  Once 05/05/23 1510 05/05/23 1642   05/05/23 1515  azithromycin (ZITHROMAX) 500 mg in sodium chloride 0.9 % 250 mL IVPB        500 mg 250 mL/hr over 60 Minutes Intravenous  Once 05/05/23 1510 05/05/23 1750       Subjective: Today, patient was seen and examined at bedside.  Complains of mild right upper quadrant/back discomfort.  No nausea vomiting fever chills headache.  Has had a bowel movement yesterday was started HAART.  No shortness of breath chest pain.  Off oxygen at this time.  Complains of generalized fatigue and weakness.    Vitals:   05/11/23 0716 05/11/23 0800 05/11/23 1114 05/11/23 1200  BP:  (!) 153/84  (!) 122/55  Pulse:  73  77  Resp:  19  16  Temp: 99.7 F (37.6 C)  98.2 F (36.8 C)   TempSrc: Oral  Oral   SpO2:  99%  94%  Weight:      Height:        Intake/Output Summary (Last 24 hours) at 05/11/2023 1413 Last data filed at 05/11/2023 1204 Gross per 24 hour  Intake 128 ml  Output 600 ml  Net -472 ml    Filed Weights   05/08/23 1329 05/11/23 0300  Weight: 73.7 kg 67.8 kg    Physical Examination: Body mass index is 24.87 kg/m.   General:  Average built, not in obvious distress, on room air HENT:   No scleral pallor or icterus noted. Oral mucosa is moist.  Chest: .  Diminished breath sounds bilaterally.  No obvious crackles noted. CVS: S1 &S2 heard. No murmur.  Regular rate and rhythm. Abdomen: Soft, nontender, nondistended.  Bowel sounds are heard.  Tenderness over the right upper quadrant, loin area nonspecific in nature Extremities: No cyanosis, clubbing or edema.  Peripheral pulses are palpable. Psych: Alert, awake and oriented, normal mood CNS:  No cranial nerve deficits.  Power equal in  all extremities.  Generalized weakness noted Skin: Warm and dry.  No rashes noted.  Data Reviewed:   CBC: Recent Labs  Lab 05/06/23 0518 05/07/23 0532 05/09/23 0501 05/10/23 0536 05/11/23 0312  WBC 9.2 7.0 7.9 12.1* 7.4  HGB 9.2* 8.9* 9.4* 10.6* 10.8*  HCT 28.2* 27.3* 27.4* 30.6* 31.0*  MCV 95.3 95.8 90.4 90.8 90.1  PLT 341 285 327 366 353     Basic Metabolic Panel: Recent Labs  Lab 05/08/23 0636 05/08/23 1448 05/09/23 0501 05/10/23 0536 05/11/23 0312  NA 134* 134* 135 131* 129*  K 3.9 3.5 3.0* 3.3* 3.5  CL 98 97* 93* 86* 86*  CO2 26 26 28 29 30   GLUCOSE 118* 136* 114* 228* 203*  BUN 35* 37* 38* 48* 51*  CREATININE 2.90* 2.99* 2.92* 3.10* 3.38*  CALCIUM 8.5* 8.7* 8.9 9.0 9.1  MG  --   --  2.1  --  2.2  PHOS 5.3*  --  5.2* 5.6* 5.7*     Liver Function Tests: Recent Labs  Lab 05/05/23 0927 05/06/23 0518 05/07/23 0532  05/08/23 0636 05/09/23 0501 05/10/23 0536 05/11/23 0312  AST 21 21 27   --   --   --   --   ALT 17 17 22   --   --   --   --   ALKPHOS 67 61 63  --   --   --   --   BILITOT 0.8 0.5 0.7  --   --   --   --   PROT 7.0 6.5 6.4*  --   --   --   --   ALBUMIN 3.6 3.3* 3.1* 3.0* 3.0* 3.1* 3.3*      Radiology Studies: CT CHEST WO CONTRAST  Result Date: 05/09/2023 CLINICAL DATA:  Chronic dyspnea.  Chest pain. EXAM: CT CHEST WITHOUT CONTRAST TECHNIQUE: Multidetector CT imaging of the chest was performed following the standard protocol without IV contrast. RADIATION DOSE REDUCTION: This exam was performed according to the departmental dose-optimization program which includes automated exposure control, adjustment of the mA and/or kV according to patient size and/or use of iterative reconstruction technique. COMPARISON:  CT chest abdomen and pelvis 05/05/2023 FINDINGS: Cardiovascular: Heart is enlarged. There is no pericardial effusion. Aorta is normal in size. There are atherosclerotic calcifications of the aorta and coronary arteries. Right-sided central  venous catheter tip ends in the SVC. Mediastinum/Nodes: There is a subcentimeter left hypodense thyroid nodule. There are no enlarged mediastinal, hilar or axillary lymph nodes. The esophagus is within normal limits. Lungs/Pleura: There are small bilateral pleural effusions which have decreased when compared to the prior examination. There are interstitial and patchy ground-glass opacities in both mid and lower lungs involving the lower lobes, right middle lobe and inferior left upper lobe. These have increased when compared to the prior examination. Trachea and central airways are patent. There is no pneumothorax. Upper Abdomen: No acute abnormality. Musculoskeletal: No chest wall mass or suspicious bone lesions identified. IMPRESSION: 1. Decreasing small bilateral pleural effusions. 2. Increasing interstitial and patchy ground-glass opacities in the mid and lower lungs. Findings are nonspecific and may be related to pulmonary edema or infection. 3. Stable cardiomegaly. 4. Subcentimeter left thyroid nodule. Not clinically significant; no follow-up imaging recommended (ref: J Am Coll Radiol. 2015 Feb;12(2): 143-50). Aortic Atherosclerosis (ICD10-I70.0). Electronically Signed   By: Darliss Cheney M.D.   On: 05/09/2023 23:37      LOS: 6 days    Joycelyn Das, MD [facility] Available via Epic secure chat 7am-7pm After these hours, please refer to coverage provider listed on [email or web address] 05/11/2023, 2:13 PM

## 2023-05-11 NOTE — Progress Notes (Signed)
[facility] Progress Note   Assessment/ Plan:   Samantha [name] is a/an 72 y.o. female with a past medical history significant for HTN, DM2, PAD, CVA, HLD, CKD 3B who presents with shortness of breath and concern for volume overload   The patient was recently hospitalized prior to this hospitalization for E. coli UTI and pyelonephritis.  She also was taking an ARB as well as NSAIDs at home.  She had an AKI and was given IV fluids.  She developed some volume overload and IV fluids were stopped.  Her creatinine was 2.9 at discharge.  When she read presented for this hospitalization her creatinine was 2.4.  She was taking Lasix 40 mg daily but had ran out a couple days before arriving.         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  Creatinine up to 2.9 at this time.   -Temporary catheter placed by CCM on 5/11, appreciate help - looks like she is improving with UOP-- continue Lasix 80 IV BID for now - adding fluid restriction -Continue to monitor daily Cr, Dose meds for GFR -Monitor Daily I/Os, Daily weight  -Maintain MAP>65 for optimal renal perfusion.  -Avoid nephrotoxic medications including NSAIDs -Use synthetic opioids (Fentanyl/Dilaudid) if needed -Serologies have been sent to workup for possible glomerular disease but seems unlikely given no significant hematuria.  Proteinuria is most likely related to her history of diabetes and recent tubular injury. - anticipate 1-2 days more of IV Lasix, then switch to PO - do not anticipate needing dialysis at least as it stands now- will observe for one more day and then d/c nontunneled HD cath if appropriate   Acute hypoxic respiratory failure.  Most likely related to volume overload.  Appreciate help from pulmonary critical care team.  Seems to be improving after thoracentesis and diuresis.  Continue with diuresis as above   Volume overload: Mostly as above.  Of note, ejection  fraction is normal.  Echocardiogram did demonstrate that IVC was distended.  She seems to be improving with diuresis as well as thoracentesis.  Continue diuretics as above   Uncontrolled Diabetes Mellitus Type 2 with Hyperglycemia: Management per primary team   HTN: Medications per primary on carvedilol   Metabolic acidosis: Resolved.   Abdominal distention/fullness: Possible constipation.  Titration of medications per primary team  Subjective:    Seen in room- back from gastric emptying study- terminated early d/t patient emesis.  On bedpan now.  Good UOP, reduced Lasix yesterday.  Weights are down.     Objective:   BP (!) 122/55   Pulse 77   Temp 98.2 F (36.8 C) (Oral)   Resp 16   Ht 5\' 5"  (1.651 m)   Wt 67.8 kg   SpO2 94%   BMI 24.87 kg/m   Intake/Output Summary (Last 24 hours) at 05/11/2023 1459 Last data filed at 05/11/2023 1204 Gross per 24 hour  Intake 128 ml  Output 600 ml  Net -472 ml   Weight change:   Physical Exam: Gen:NAD, on O2 CVS:RRR Resp: normal WOB, doesn't sound terrible Abd: soft Ext: no LE edema ACCESS: R IJ nontunneled HD catheter  Imaging: NM GASTRIC EMPTYING  Result Date: 05/11/2023 CLINICAL DATA:  Concern for gastroparesis. EXAM: NUCLEAR MEDICINE GASTRIC EMPTYING SCAN TECHNIQUE: After oral ingestion of radiolabeled meal, sequential abdominal images were obtained for 60 minutes. Residual percentage of activity remaining within the stomach was calculated at 60 minutes.  Examination was terminated early due to patient emesis. RADIOPHARMACEUTICALS:  2.1 mCi Tc-53m sulfur colloid in standardized meal COMPARISON:  CT May 05, 2023 FINDINGS: Expected location of the stomach in the left upper quadrant. Ingested meal empties the stomach gradually over the course of the study with 60% retention at 60 min. IMPRESSION: Nondiagnostic study secondary to patient emesis. Electronically Signed   By: Maudry Mayhew M.D.   On: 05/11/2023 14:25   CT CHEST WO  CONTRAST  Result Date: 05/09/2023 CLINICAL DATA:  Chronic dyspnea.  Chest pain. EXAM: CT CHEST WITHOUT CONTRAST TECHNIQUE: Multidetector CT imaging of the chest was performed following the standard protocol without IV contrast. RADIATION DOSE REDUCTION: This exam was performed according to the departmental dose-optimization program which includes automated exposure control, adjustment of the mA and/or kV according to patient size and/or use of iterative reconstruction technique. COMPARISON:  CT chest abdomen and pelvis 05/05/2023 FINDINGS: Cardiovascular: Heart is enlarged. There is no pericardial effusion. Aorta is normal in size. There are atherosclerotic calcifications of the aorta and coronary arteries. Right-sided central venous catheter tip ends in the SVC. Mediastinum/Nodes: There is a subcentimeter left hypodense thyroid nodule. There are no enlarged mediastinal, hilar or axillary lymph nodes. The esophagus is within normal limits. Lungs/Pleura: There are small bilateral pleural effusions which have decreased when compared to the prior examination. There are interstitial and patchy ground-glass opacities in both mid and lower lungs involving the lower lobes, right middle lobe and inferior left upper lobe. These have increased when compared to the prior examination. Trachea and central airways are patent. There is no pneumothorax. Upper Abdomen: No acute abnormality. Musculoskeletal: No chest wall mass or suspicious bone lesions identified. IMPRESSION: 1. Decreasing small bilateral pleural effusions. 2. Increasing interstitial and patchy ground-glass opacities in the mid and lower lungs. Findings are nonspecific and may be related to pulmonary edema or infection. 3. Stable cardiomegaly. 4. Subcentimeter left thyroid nodule. Not clinically significant; no follow-up imaging recommended (ref: J Am Coll Radiol. 2015 Feb;12(2): 143-50). Aortic Atherosclerosis (ICD10-I70.0). Electronically Signed   By: Darliss Cheney M.D.   On: 05/09/2023 23:37    Labs: BMET Recent Labs  Lab 05/06/23 0518 05/07/23 0532 05/08/23 0636 05/08/23 1448 05/09/23 0501 05/10/23 0536 05/11/23 0312  NA 133* 133* 134* 134* 135 131* 129*  K 3.8 3.9 3.9 3.5 3.0* 3.3* 3.5  CL 101 99 98 97* 93* 86* 86*  CO2 23 25 26 26 28 29 30   GLUCOSE 123* 69* 118* 136* 114* 228* 203*  BUN 29* 35* 35* 37* 38* 48* 51*  CREATININE 2.63* 2.83* 2.90* 2.99* 2.92* 3.10* 3.38*  CALCIUM 8.3* 8.4* 8.5* 8.7* 8.9 9.0 9.1  PHOS  --   --  5.3*  --  5.2* 5.6* 5.7*   CBC Recent Labs  Lab 05/07/23 0532 05/09/23 0501 05/10/23 0536 05/11/23 0312  WBC 7.0 7.9 12.1* 7.4  HGB 8.9* 9.4* 10.6* 10.8*  HCT 27.3* 27.4* 30.6* 31.0*  MCV 95.8 90.4 90.8 90.1  PLT 285 327 366 353    Medications:     atorvastatin  80 mg Oral Daily   bisacodyl  10 mg Rectal Daily   carvedilol  25 mg Oral BID WC   Chlorhexidine Gluconate Cloth  6 each Topical Q0600   clopidogrel  75 mg Oral Daily   diphenhydrAMINE  25 mg Intravenous Once   docusate sodium  100 mg Oral BID   fluticasone  2 spray Each Nare Daily   furosemide  80 mg Intravenous  BID   guaiFENesin  600 mg Oral BID   heparin  5,000 Units Subcutaneous Q8H   insulin aspart  0-15 Units Subcutaneous TID WC   insulin aspart  0-5 Units Subcutaneous QHS   insulin glargine-yfgn  12 Units Subcutaneous Daily   oxymetazoline  1 spray Each Nare Once   polyethylene glycol  17 g Oral BID   sodium chloride flush  3 mL Intravenous Q12H    Bufford Buttner MD 05/11/2023, 2:59 PM

## 2023-05-12 LAB — RENAL FUNCTION PANEL
Albumin: 3.5 g/dL (ref 3.5–5.0)
Albumin: 3.4 g/dL — ABNORMAL LOW (ref 3.5–5.0)
Anion gap: 14 (ref 5–15)
Anion gap: 17 — ABNORMAL HIGH (ref 5–15)
BUN: 60 mg/dL — ABNORMAL HIGH (ref 8–23)
BUN: 65 mg/dL — ABNORMAL HIGH (ref 8–23)
CO2: 27 mmol/L (ref 22–32)
CO2: 31 mmol/L (ref 22–32)
Calcium: 9.3 mg/dL (ref 8.9–10.3)
Calcium: 9.3 mg/dL (ref 8.9–10.3)
Chloride: 85 mmol/L — ABNORMAL LOW (ref 98–111)
Chloride: 79 mmol/L — ABNORMAL LOW (ref 98–111)
Creatinine, Ser: 3.67 mg/dL — ABNORMAL HIGH (ref 0.44–1.00)
Creatinine, Ser: 3.73 mg/dL — ABNORMAL HIGH (ref 0.44–1.00)
GFR, Estimated: 13 mL/min — ABNORMAL LOW (ref >60)
GFR, Estimated: 13 mL/min — ABNORMAL LOW (ref >60)
Glucose, Bld: 244 mg/dL — ABNORMAL HIGH (ref 70–99)
Glucose, Bld: 159 mg/dL — ABNORMAL HIGH (ref 70–99)
Phosphorus: 5.8 mg/dL — ABNORMAL HIGH (ref 2.5–4.6)
Phosphorus: 6.4 mg/dL — ABNORMAL HIGH (ref 2.5–4.6)
Potassium: 3.3 mmol/L — ABNORMAL LOW (ref 3.5–5.1)
Potassium: 3 mmol/L — ABNORMAL LOW (ref 3.5–5.1)
Sodium: 126 mmol/L — ABNORMAL LOW (ref 135–145)
Sodium: 127 mmol/L — ABNORMAL LOW (ref 135–145)

## 2023-05-12 LAB — CBC
HCT: 31.7 % — ABNORMAL LOW (ref 36.0–46.0)
Hemoglobin: 11.1 g/dL — ABNORMAL LOW (ref 12.0–15.0)
MCH: 31 pg (ref 26.0–34.0)
MCHC: 35 g/dL (ref 30.0–36.0)
MCV: 88.5 fL (ref 80.0–100.0)
Platelets: 381 10*3/uL (ref 150–400)
RBC: 3.58 MIL/uL — ABNORMAL LOW (ref 3.87–5.11)
RDW: 12.4 % (ref 11.5–15.5)
WBC: 9.3 10*3/uL (ref 4.0–10.5)
nRBC: 0 % (ref 0.0–0.2)

## 2023-05-12 LAB — BODY FLUID CULTURE W GRAM STAIN: Gram Stain: NONE SEEN

## 2023-05-12 LAB — PROTEIN ELECTROPHORESIS, SERUM
A/G Ratio: 0.9 (ref 0.7–1.7)
Albumin ELP: 3.1 g/dL (ref 2.9–4.4)
Alpha-1-Globulin: 0.3 g/dL (ref 0.0–0.4)
Alpha-2-Globulin: 1.1 g/dL — ABNORMAL HIGH (ref 0.4–1.0)
Beta Globulin: 0.9 g/dL (ref 0.7–1.3)
Gamma Globulin: 0.9 g/dL (ref 0.4–1.8)
Globulin, Total: 3.3 g/dL (ref 2.2–3.9)
Total Protein ELP: 6.4 g/dL (ref 6.0–8.5)

## 2023-05-12 LAB — GLUCOSE, CAPILLARY
Glucose-Capillary: 315 mg/dL — ABNORMAL HIGH (ref 70–99)
Glucose-Capillary: 226 mg/dL — ABNORMAL HIGH (ref 70–99)
Glucose-Capillary: 231 mg/dL — ABNORMAL HIGH (ref 70–99)
Glucose-Capillary: 210 mg/dL — ABNORMAL HIGH (ref 70–99)

## 2023-05-12 MED ORDER — ORAL CARE MOUTH RINSE: 15.0000 mL | OROMUCOSAL | Status: DC | PRN

## 2023-05-12 MED ORDER — LIDOCAINE-PRILOCAINE 2.5-2.5 % EX CREA: 1.0000 | TOPICAL_CREAM | CUTANEOUS | Status: DC | PRN

## 2023-05-12 MED ORDER — CHLORHEXIDINE GLUCONATE CLOTH 2 % EX PADS
6.0000 | MEDICATED_PAD | Freq: Every day | CUTANEOUS | Status: DC
  Administered 2023-05-14 – 2023-05-21 (×8): 6 via TOPICAL

## 2023-05-12 MED ORDER — LIDOCAINE HCL (PF) 1 % IJ SOLN: 5.0000 mL | INTRAMUSCULAR | Status: DC | PRN

## 2023-05-12 MED ORDER — HEPARIN SODIUM (PORCINE) 1000 UNIT/ML DIALYSIS: 1000.0000 [IU] | INTRAMUSCULAR | Status: DC | PRN

## 2023-05-12 MED ORDER — HEPARIN SODIUM (PORCINE) 1000 UNIT/ML DIALYSIS
2000.0000 [IU] | Freq: Once | INTRAMUSCULAR | Status: AC
  Administered 2023-05-12: 2000 [IU] via INTRAVENOUS_CENTRAL
  Filled 2023-05-12: qty 2

## 2023-05-12 MED ORDER — ALTEPLASE 2 MG IJ SOLR: 2.0000 mg | Freq: Once | INTRAMUSCULAR | Status: DC | PRN

## 2023-05-12 MED ORDER — ANTICOAGULANT SODIUM CITRATE 4% (200MG/5ML) IV SOLN: 5.0000 mL | Status: DC | PRN

## 2023-05-12 MED ORDER — HEPARIN SODIUM (PORCINE) 1000 UNIT/ML IJ SOLN
2400.0000 [IU] | Freq: Once | INTRAMUSCULAR | Status: AC
  Administered 2023-05-12: 2400 [IU]
  Filled 2023-05-12: qty 3

## 2023-05-12 MED ORDER — PENTAFLUOROPROP-TETRAFLUOROETH EX AERO: 1.0000 | INHALATION_SPRAY | CUTANEOUS | Status: DC | PRN

## 2023-05-12 MED ORDER — INSULIN GLARGINE-YFGN 100 UNIT/ML ~~LOC~~ SOLN
15.0000 [IU] | Freq: Every day | SUBCUTANEOUS | Status: DC
  Administered 2023-05-12 – 2023-05-28 (×16): 15 [IU] via SUBCUTANEOUS
  Filled 2023-05-12 (×19): qty 0.15

## 2023-05-12 MED ORDER — POTASSIUM CHLORIDE CRYS ER 20 MEQ PO TBCR
40.0000 meq | EXTENDED_RELEASE_TABLET | Freq: Once | ORAL | Status: AC
  Administered 2023-05-12: 40 meq via ORAL
  Filled 2023-05-12: qty 2

## 2023-05-12 NOTE — Progress Notes (Signed)
TR[facility] night cross cover note:   I was notified by RN that the patient is complaining of muscle spasms involving the bilateral lower extremities.  Not associated with any pain, numbness, or paresthesias.  RN conveys that most recent potassium level was found to be 3.0 without ensuing potassium supplementation.  I subsequently ordered potassium chloride 40 mill equivalents p.o. x 1 dose now.  She has a renal function panel ordered for the morning.  I also ordered a serum magnesium level to be checked in the morning.    Newton Pigg, DO Hospitalist

## 2023-05-12 NOTE — TOC Progression Note (Signed)
Transition of Care [facility]) - Progression Note    Patient Details  Name: Samantha [name] MRN: [id number] Date of Birth: 02/26/1951  [facility] CM/SW Contact  Lawerance Sabal, RN Phone Number: 05/12/2023, 11:46 AM  Clinical Narrative:     Sherron Monday w patient and daughter at bedside.  Patient will have support from family at discharge.  Patient active at [facility] w Dr Alvis Lemmings, next apt 5/20, added to AVS.  Daughter states best number to reach them is [phone], patient's daughter Samantha [name], I have shared this with Charlies Constable at [facility] as I see there are multiple failed attempts to reach patient.  Patient declines barriers to accessing medications or PCP, gets meds through [facility]   Expected Discharge Plan: Home/Self Care Barriers to Discharge: Continued Medical Work up  Expected Discharge Plan and Services   Discharge Planning Services: CM Consult   Living arrangements for the past 2 months: Single Family Home                                       Social Determinants of Health (SDOH) Interventions SDOH Screenings   Food Insecurity: No Food Insecurity (05/05/2023)  Housing: Low Risk  (05/05/2023)  Transportation Needs: No Transportation Needs (05/05/2023)  Utilities: Not At Risk (05/05/2023)  Depression (PHQ2-9): Low Risk  (11/19/2021)  Tobacco Use: Low Risk  (05/05/2023)    Readmission Risk Interventions     No data to display

## 2023-05-12 NOTE — Progress Notes (Signed)
PROGRESS NOTE    Samantha [name]  ZOX:[id number] DOB: 04-03-1951 DOA: 05/05/2023 PCP: Hoy Register, MD    Brief Narrative:   Patient is a 72 year old female with past medical history of PAD, hypertension, type 2 diabetes, history of CVA, depression and anxiety was recently discharged from the hospital on 04/18/23 after treatment for UTI/pyelonephritis, pulmonary edema and was given prescription for Lasix for 7 days.  This time again presented with worsening abdominal discomfort chest discomfort and chills. CT chest abdomen/pelvis was done which showed pulmonary edema versus bibasilar infiltrates.  BNP was elevated to 600.  Patient had initial persistent pulmonary edema, acute hypoxic respiratory failure requiring 15 L of supplemental oxygen, was transferred to the ICU.  During hospital stay, patient underwent bilateral thoracocentesis with removal of1 L of pleural fluid from the left and 1.5 L from the right.  2D echocardiogram showed diastolic dysfunction and moderate pulmonary hypertension.  Patient was also seen by nephrology during hospitalization was put on high-dose IV Lasix and was   Assessment and plan.  Oliguric AKI on CKD stage IIIb -Baseline creatinine 1.7, peaked today at 3.7-with minimal urine output, 200 cc over last 24 hours, unfortunately creatinine trending up, urine output is decreasing, and sodium is down, management per renal, she will start with HD today.  Acute hypoxemic respiratory failure Secondary pulmonary edema, bilateral pleural effusion, BNP elevated 601.  procalcitonin less than 0.10.  Continue IV Lasix high-dose..  Was initially on 15 L of oxygen but has been weaned down to room air. Status post bilateral thoracocentesis.  Patient had hemodialysis catheter placed in by nephrology.  Patient has received IV Lasix high-dose which has been decreased to 80 milligram twice daily at this time.  Pleural fluid analysis transudative in nature.  Mild hypokalemia.  Improved  after replacement.  Will continue oral potassium.  Check BMP in AM  Mild hyponatremia will continue to monitor closely.  Nephrology on board.   Pulmonary edema/acute on chronic diastolic heart failure: Nephrology on board.  Has been diuresing. Review of recent echocardiogram revealed EF 55 to 60%, mild LVH, left ventricular diastolic parameters indeterminate and RVSP of 46.4 mmHg.  Negative balance for 8545 ml.  On IV Lasix 80 mg twice daily.  Nephrology following.   Concern for pneumonia: -Chest x-ray showed bibasilar patchy opacities, concern for pneumonia. Received 1 dose of Rocephin and Zithromax in the ED.  Procalcitonin less than 0.1.  Antibiotics discontinued.  Blood cultures negative in 5 days.   Abdominal pain, abdominal fullness, right loin pain. secondary to constipation.  Continue bowel regimen.  History of diabetes.  Will continue on aggressive bowel regimen.  Patient needed enema and manual disimpaction initially.  Will continue with MiraLAX twice daily and add Dulcolax suppository daily.  Urinalysis 5 days back without any evidence of infection.  Diabetes mellitus type 2: Continue sliding scale insulin Accu-Cheks.  Glimepiride on hold   History of CAD: -Continue Plavix   Hypertension: Continue Coreg.  Hold amlodipine.  Latest blood pressure 122/55    DVT prophylaxis: heparin injection 5,000 Units Start: 05/09/23 0600   Code Status:     Code Status: Full Code  Disposition: Home likely in 1 to 2 days/when okay with nephrology.  Status is: Inpatient Remains inpatient appropriate because: Volume overload, IV Lasix, electrolyte imbalance,   Family Communication:  None at bedside today  Consultants:  Nephrology.  Procedures:  Right chest wall hemodialysis catheter placement.  Antimicrobials:  None  Anti-infectives (From admission, onward)    Start  Dose/Rate Route Frequency Ordered Stop   05/08/23 1000  ceFAZolin (ANCEF) IVPB 2g/100 mL premix        2  g 200 mL/hr over 30 Minutes Intravenous To Radiology 05/07/23 1537 05/09/23 1000   05/05/23 1515  cefTRIAXone (ROCEPHIN) 1 g in sodium chloride 0.9 % 100 mL IVPB        1 g 200 mL/hr over 30 Minutes Intravenous  Once 05/05/23 1510 05/05/23 1642   05/05/23 1515  azithromycin (ZITHROMAX) 500 mg in sodium chloride 0.9 % 250 mL IVPB        500 mg 250 mL/hr over 60 Minutes Intravenous  Once 05/05/23 1510 05/05/23 1750       Subjective:  No significant events overnight as discussed with staff, she will start HD today.  Vitals:   05/12/23 1201 05/12/23 1202 05/12/23 1203 05/12/23 1204  BP:      Pulse:      Resp: 18 20 13 13   Temp:      TempSrc:      SpO2:      Weight:      Height:        Intake/Output Summary (Last 24 hours) at 05/12/2023 1251 Last data filed at 05/12/2023 0903 Gross per 24 hour  Intake --  Output 450 ml  Net -450 ml   Filed Weights   05/08/23 1329 05/11/23 0300  Weight: 73.7 kg 67.8 kg    Physical Examination: Body mass index is 24.87 kg/m.   Awake Alert, Oriented X 3, No new F.N deficits, Normal affect Symmetrical Chest wall movement, Good air movement bilaterally, CTAB RRR,No Gallops,Rubs or new Murmurs, No Parasternal Heave +ve B.Sounds, Abd Soft, No tenderness, No rebound - guarding or rigidity. No Cyanosis, Clubbing or edema, No new Rash or bruise     Data Reviewed:   CBC: Recent Labs  Lab 05/06/23 0518 05/07/23 0532 05/09/23 0501 05/10/23 0536 05/11/23 0312  WBC 9.2 7.0 7.9 12.1* 7.4  HGB 9.2* 8.9* 9.4* 10.6* 10.8*  HCT 28.2* 27.3* 27.4* 30.6* 31.0*  MCV 95.3 95.8 90.4 90.8 90.1  PLT 341 285 327 366 353    Basic Metabolic Panel: Recent Labs  Lab 05/08/23 0636 05/08/23 1448 05/09/23 0501 05/10/23 0536 05/11/23 0312 05/12/23 0745  NA 134* 134* 135 131* 129* 126*  K 3.9 3.5 3.0* 3.3* 3.5 3.3*  CL 98 97* 93* 86* 86* 85*  CO2 26 26 28 29 30 27   GLUCOSE 118* 136* 114* 228* 203* 244*  BUN 35* 37* 38* 48* 51* 60*  CREATININE  2.90* 2.99* 2.92* 3.10* 3.38* 3.67*  CALCIUM 8.5* 8.7* 8.9 9.0 9.1 9.3  MG  --   --  2.1  --  2.2  --   PHOS 5.3*  --  5.2* 5.6* 5.7* 5.8*    Liver Function Tests: Recent Labs  Lab 05/06/23 0518 05/07/23 0532 05/08/23 0636 05/09/23 0501 05/10/23 0536 05/11/23 0312 05/12/23 0745  AST 21 27  --   --   --   --   --   ALT 17 22  --   --   --   --   --   ALKPHOS 61 63  --   --   --   --   --   BILITOT 0.5 0.7  --   --   --   --   --   PROT 6.5 6.4*  --   --   --   --   --   ALBUMIN 3.3* 3.1*  3.0* 3.0* 3.1* 3.3* 3.5     Radiology Studies: NM GASTRIC EMPTYING  Result Date: 05/11/2023 CLINICAL DATA:  Concern for gastroparesis. EXAM: NUCLEAR MEDICINE GASTRIC EMPTYING SCAN TECHNIQUE: After oral ingestion of radiolabeled meal, sequential abdominal images were obtained for 60 minutes. Residual percentage of activity remaining within the stomach was calculated at 60 minutes. Examination was terminated early due to patient emesis. RADIOPHARMACEUTICALS:  2.1 mCi Tc-77m sulfur colloid in standardized meal COMPARISON:  CT May 05, 2023 FINDINGS: Expected location of the stomach in the left upper quadrant. Ingested meal empties the stomach gradually over the course of the study with 60% retention at 60 min. IMPRESSION: Nondiagnostic study secondary to patient emesis. Electronically Signed   By: Maudry Mayhew M.D.   On: 05/11/2023 14:25      LOS: 7 days    Huey Bienenstock, MD [facility] Available via Epic secure chat 7am-7pm After these hours, please refer to coverage provider listed on [email or web address] 05/12/2023, 12:51 PM

## 2023-05-12 NOTE — Progress Notes (Signed)
[facility] Progress Note   Assessment/ Plan:   Samantha [name] is a/an 72 y.o. female with a past medical history significant for HTN, DM2, PAD, CVA, HLD, CKD 3B who presents with shortness of breath and concern for volume overload   The patient was recently hospitalized prior to this hospitalization for E. coli UTI and pyelonephritis.  She also was taking an ARB as well as NSAIDs at home.  She had an AKI and was given IV fluids.  She developed some volume overload and IV fluids were stopped.  Her creatinine was 2.9 at discharge.  When she read presented for this hospitalization her creatinine was 2.4.  She was taking Lasix 40 mg daily but had ran out a couple days before arriving.         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  Creatinine up to 2.9 at this time.   -Temporary catheter placed by CCM on 5/11, appreciate help - looks like she is improving with UOP-- continue Lasix 80 IV BID for now - adding fluid restriction -Continue to monitor daily Cr, Dose meds for GFR -Monitor Daily I/Os, Daily weight  -Maintain MAP>65 for optimal renal perfusion.  -Avoid nephrotoxic medications including NSAIDs -Use synthetic opioids (Fentanyl/Dilaudid) if needed -Serologies have been sent to workup for possible glomerular disease but seems unlikely given no significant hematuria.  Proteinuria is most likely related to her history of diabetes and recent tubular injury. - I anticipated her labs getting better- but they are not - Na down, Cr up, UOP decreased.   - HD #1 today   Acute hypoxic respiratory failure.  Most likely related to volume overload.  Appreciate help from pulmonary critical care team.  Seems to be improving after thoracentesis and diuresis.  Continue with diuresis as above   Volume overload: Mostly as above.  Of note, ejection fraction is normal.  Echocardiogram did demonstrate that IVC was distended.  She seems to  be improving with diuresis as well as thoracentesis.  Continue diuretics as above   Uncontrolled Diabetes Mellitus Type 2 with Hyperglycemia: Management per primary team   HTN: Medications per primary on carvedilol   Metabolic acidosis: Resolved.   Abdominal distention/fullness: Possible constipation.  Titration of medications per primary team  Subjective:   Cr up again, Na down, UOP not adequate for labs.  D/w pt's dtr, interpeter, and pt.  For HD otday   Objective:   BP (!) 144/71 (BP Location: Left Arm)   Pulse 69   Temp 98.2 F (36.8 C) (Oral)   Resp 13   Ht 5\' 5"  (1.651 m)   Wt 67.8 kg   SpO2 91%   BMI 24.87 kg/m   Intake/Output Summary (Last 24 hours) at 05/12/2023 1114 Last data filed at 05/12/2023 1610 Gross per 24 hour  Intake 3 ml  Output 450 ml  Net -447 ml   Weight change:   Physical Exam: Gen:NAD, on O2 CVS:RRR Resp: normal WOB, doesn't sound terrible Abd: soft Ext: no LE edema ACCESS: R IJ nontunneled HD catheter  Imaging: NM GASTRIC EMPTYING  Result Date: 05/11/2023 CLINICAL DATA:  Concern for gastroparesis. EXAM: NUCLEAR MEDICINE GASTRIC EMPTYING SCAN TECHNIQUE: After oral ingestion of radiolabeled meal, sequential abdominal images were obtained for 60 minutes. Residual percentage of activity remaining within the stomach was calculated at 60 minutes. Examination was terminated early due to patient emesis. RADIOPHARMACEUTICALS:  2.1 mCi Tc-25m sulfur colloid in standardized meal COMPARISON:  CT May 05, 2023 FINDINGS: Expected location of the stomach in the left upper quadrant. Ingested meal empties the stomach gradually over the course of the study with 60% retention at 60 min. IMPRESSION: Nondiagnostic study secondary to patient emesis. Electronically Signed   By: Maudry Mayhew M.D.   On: 05/11/2023 14:25    Labs: BMET Recent Labs  Lab 05/07/23 0532 05/08/23 0636 05/08/23 1448 05/09/23 0501 05/10/23 0536 05/11/23 0312 05/12/23 0745  NA 133* 134*  134* 135 131* 129* 126*  K 3.9 3.9 3.5 3.0* 3.3* 3.5 3.3*  CL 99 98 97* 93* 86* 86* 85*  CO2 25 26 26 28 29 30 27   GLUCOSE 69* 118* 136* 114* 228* 203* 244*  BUN 35* 35* 37* 38* 48* 51* 60*  CREATININE 2.83* 2.90* 2.99* 2.92* 3.10* 3.38* 3.67*  CALCIUM 8.4* 8.5* 8.7* 8.9 9.0 9.1 9.3  PHOS  --  5.3*  --  5.2* 5.6* 5.7* 5.8*   CBC Recent Labs  Lab 05/07/23 0532 05/09/23 0501 05/10/23 0536 05/11/23 0312  WBC 7.0 7.9 12.1* 7.4  HGB 8.9* 9.4* 10.6* 10.8*  HCT 27.3* 27.4* 30.6* 31.0*  MCV 95.8 90.4 90.8 90.1  PLT 285 327 366 353    Medications:     atorvastatin  80 mg Oral Daily   bisacodyl  10 mg Rectal Daily   carvedilol  25 mg Oral BID WC   Chlorhexidine Gluconate Cloth  6 each Topical Q0600   Chlorhexidine Gluconate Cloth  6 each Topical Q0600   clopidogrel  75 mg Oral Daily   diphenhydrAMINE  25 mg Intravenous Once   docusate sodium  100 mg Oral BID   fluticasone  2 spray Each Nare Daily   furosemide  80 mg Intravenous BID   guaiFENesin  600 mg Oral BID   heparin  2,000 Units Dialysis Once in dialysis   heparin  5,000 Units Subcutaneous Q8H   insulin aspart  0-15 Units Subcutaneous TID WC   insulin aspart  0-5 Units Subcutaneous QHS   insulin glargine-yfgn  15 Units Subcutaneous Daily   oxymetazoline  1 spray Each Nare Once   polyethylene glycol  17 g Oral BID   sodium chloride flush  3 mL Intravenous Q12H    Bufford Buttner MD 05/12/2023, 11:14 AM

## 2023-05-12 NOTE — Progress Notes (Signed)
Received patient in bed to unit.  Alert and oriented.  Informed consent signed and in chart.   TX duration:2.5  Patient tolerated well.  Transported back to the room  Alert, without acute distress.  Hand-off given to patient's nurse.   Access used: right [facility] Access issues: none  Total UF removed: Medication(s) given: zofran    05/12/23 1700  Vitals  Temp 97.9 F (36.6 C)  Temp Source Oral  BP 121/65  MAP (mmHg) 83  BP Location Right Arm  BP Method Automatic  Patient Position (if appropriate) Lying  Pulse Rate 72  Pulse Rate Source Monitor  ECG Heart Rate 72  Resp 15  Oxygen Therapy  SpO2 92 %  O2 Device Room Air  During Treatment Monitoring  HD Safety Checks Performed Yes  Intra-Hemodialysis Comments Tx completed  Dialysis Fluid Bolus Normal Saline  Bolus Amount (mL) 300 mL      Alphonse Asbridge [facility]

## 2023-05-13 LAB — RENAL FUNCTION PANEL
Albumin: 3.3 g/dL — ABNORMAL LOW (ref 3.5–5.0)
Anion gap: 15 (ref 5–15)
BUN: 38 mg/dL — ABNORMAL HIGH (ref 8–23)
CO2: 24 mmol/L (ref 22–32)
Calcium: 8.9 mg/dL (ref 8.9–10.3)
Chloride: 88 mmol/L — ABNORMAL LOW (ref 98–111)
Creatinine, Ser: 3.12 mg/dL — ABNORMAL HIGH (ref 0.44–1.00)
GFR, Estimated: 16 mL/min — ABNORMAL LOW (ref >60)
Glucose, Bld: 131 mg/dL — ABNORMAL HIGH (ref 70–99)
Phosphorus: 4.6 mg/dL (ref 2.5–4.6)
Potassium: 3.8 mmol/L (ref 3.5–5.1)
Sodium: 127 mmol/L — ABNORMAL LOW (ref 135–145)

## 2023-05-13 LAB — GLUCOSE, CAPILLARY
Glucose-Capillary: 168 mg/dL — ABNORMAL HIGH (ref 70–99)
Glucose-Capillary: 273 mg/dL — ABNORMAL HIGH (ref 70–99)
Glucose-Capillary: 222 mg/dL — ABNORMAL HIGH (ref 70–99)
Glucose-Capillary: 234 mg/dL — ABNORMAL HIGH (ref 70–99)
Glucose-Capillary: 139 mg/dL — ABNORMAL HIGH (ref 70–99)
Glucose-Capillary: 137 mg/dL — ABNORMAL HIGH (ref 70–99)
Glucose-Capillary: 210 mg/dL — ABNORMAL HIGH (ref 70–99)

## 2023-05-13 LAB — MAGNESIUM: Magnesium: 2 mg/dL (ref 1.7–2.4)

## 2023-05-13 LAB — HEPATITIS B SURFACE ANTIBODY, QUANTITATIVE: Hep B S AB Quant (Post): <3.5 m[IU]/mL — ABNORMAL LOW (ref >9.9)

## 2023-05-13 MED ORDER — POLYETHYLENE GLYCOL 3350 17 G PO PACK
17.0000 g | PACK | Freq: Two times a day (BID) | ORAL | Status: AC
  Administered 2023-05-13 – 2023-05-15 (×2): 17 g via ORAL
  Filled 2023-05-13 (×5): qty 1

## 2023-05-13 MED ORDER — INSULIN ASPART 100 UNIT/ML IJ SOLN
3.0000 [IU] | Freq: Three times a day (TID) | INTRAMUSCULAR | Status: DC
  Administered 2023-05-13 – 2023-05-28 (×20): 3 [IU] via SUBCUTANEOUS

## 2023-05-13 MED ORDER — CARVEDILOL 6.25 MG PO TABS
6.2500 mg | ORAL_TABLET | Freq: Two times a day (BID) | ORAL | Status: DC
  Administered 2023-05-13 – 2023-05-28 (×24): 6.25 mg via ORAL
  Filled 2023-05-13 (×25): qty 1

## 2023-05-13 NOTE — Progress Notes (Signed)
PROGRESS NOTE    Samantha [name]  ZOX:[id number] DOB: Jul 08, 1951 DOA: 05/05/2023 PCP: Hoy Register, MD    Brief Narrative:   Patient is a 72 year old female with past medical history of PAD, hypertension, type 2 diabetes, history of CVA, depression and anxiety was recently discharged from the hospital on 04/18/23 after treatment for UTI/pyelonephritis, pulmonary edema and was given prescription for Lasix for 7 days.  This time again presented with worsening abdominal discomfort chest discomfort and chills. CT chest abdomen/pelvis was done which showed pulmonary edema versus bibasilar infiltrates.  BNP was elevated to 600.  Patient had initial persistent pulmonary edema, acute hypoxic respiratory failure requiring 15 L of supplemental oxygen, was transferred to the ICU.  During hospital stay, patient underwent bilateral thoracocentesis with removal of1 L of pleural fluid from the left and 1.5 L from the right.  2D echocardiogram showed diastolic dysfunction and moderate pulmonary hypertension.  Patient was also seen by nephrology during hospitalization,  was put on high-dose IV Lasix fortunately she remains with low urinary output, was started on HD 05/12/2023.  Assessment and plan.  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  Acute hypoxemic respiratory failure Secondary pulmonary edema, bilateral pleural effusion, BNP elevated 601.  procalcitonin less than 0.10.  Continue IV Lasix high-dose..  Was initially on 15 L of oxygen but has been weaned down to room air. Status post bilateral thoracocentesis.  Patient had hemodialysis catheter placed in by nephrology.  Patient has received IV Lasix high-dose which has been decreased to 80 milligram twice  daily at this time.  Pleural fluid analysis transudative in nature.  Mild hypokalemia.  Improved after replacement.  Will continue oral potassium.  Check BMP in AM  Mild hyponatremia will continue to monitor closely.  Nephrology on board.   Pulmonary edema/acute on chronic diastolic heart failure: Nephrology on board.  Has been diuresing. Review of recent echocardiogram revealed EF 55 to 60%, mild LVH, left ventricular diastolic parameters indeterminate and RVSP of 46.4 mmHg.  Negative balance for 8545 ml.  On IV Lasix 80 mg twice daily.  Nephrology following.   Concern for pneumonia: -Chest x-ray showed bibasilar patchy opacities, concern for pneumonia. Received 1 dose of Rocephin and Zithromax in the ED.  Procalcitonin less than 0.1.  Antibiotics discontinued.  Blood cultures negative in 5 days.   Abdominal pain, abdominal fullness, right loin pain. secondary to constipation.  Continue bowel regimen.  History of diabetes.  Will continue on aggressive bowel regimen.  Patient needed enema and manual disimpaction initially.  Will continue with MiraLAX twice daily and add Dulcolax suppository daily.  Urinalysis 5 days back without any evidence of infection.  Diabetes mellitus type 2: Continue sliding scale insulin Accu-Cheks.  Glimepiride on hold   History of CAD: -Continue Plavix   Hypertension: Blood pressure on the lower side, continue to hold amlodipine, Coreg dose has been decreased from 25 mg p.o. twice daily to 6.25 mg p.o. twice daily.       DVT prophylaxis: heparin injection 5,000 Units Start: 05/09/23 0600   Code Status:     Code Status: Full Code  Disposition: Home likely in 1 to 2 days/when okay with nephrology.  Status is: Inpatient Remains inpatient appropriate because: Volume overload, IV Lasix, electrolyte imbalance,  Family Communication:  D/W daughter  at bedside today  Consultants:  Nephrology.  Procedures:  Right chest wall hemodialysis catheter  placement.  Antimicrobials:  None  Anti-infectives (From admission, onward)    Start     Dose/Rate Route Frequency Ordered Stop   05/08/23 1000  ceFAZolin (ANCEF) IVPB 2g/100 mL premix        2 g 200 mL/hr over 30 Minutes Intravenous To Radiology 05/07/23 1537 05/09/23 1000   05/05/23 1515  cefTRIAXone (ROCEPHIN) 1 g in sodium chloride 0.9 % 100 mL IVPB        1 g 200 mL/hr over 30 Minutes Intravenous  Once 05/05/23 1510 05/05/23 1642   05/05/23 1515  azithromycin (ZITHROMAX) 500 mg in sodium chloride 0.9 % 250 mL IVPB        500 mg 250 mL/hr over 60 Minutes Intravenous  Once 05/05/23 1510 05/05/23 1750       Subjective:  She reports muscle spasm in lower extremities overnight, does report some nausea, as well notified by staff she has poor appetite.  Vitals:   05/13/23 0413 05/13/23 0500 05/13/23 0800 05/13/23 1255  BP: 110/69  (!) 121/55 (!) 144/70  Pulse: 73  77   Resp: 18  19   Temp: 98.2 F (36.8 C)  98 F (36.7 C) 97.9 F (36.6 C)  TempSrc: Oral  Oral Oral  SpO2: 96%  95%   Weight:  66.7 kg    Height:        Intake/Output Summary (Last 24 hours) at 05/13/2023 1354 Last data filed at 05/13/2023 1352 Gross per 24 hour  Intake 238 ml  Output 850 ml  Net -612 ml   Filed Weights   05/12/23 1411 05/12/23 1709 05/13/23 0500  Weight: 66.1 kg 66.5 kg 66.7 kg    Physical Examination: Body mass index is 24.47 kg/m.   Awake Alert, Oriented X 3, No new F.N deficits, Normal affect Symmetrical Chest wall movement, Good air movement bilaterally, CTAB RRR,No Gallops,Rubs or new Murmurs, No Parasternal Heave +ve B.Sounds, Abd Soft, No tenderness, No rebound - guarding or rigidity. No Cyanosis, Clubbing or edema, No new Rash or bruise       Data Reviewed:   CBC: Recent Labs  Lab 05/07/23 0532 05/09/23 0501 05/10/23 0536 05/11/23 0312 05/12/23 1435  WBC 7.0 7.9 12.1* 7.4 9.3  HGB 8.9* 9.4* 10.6* 10.8* 11.1*  HCT 27.3* 27.4* 30.6* 31.0* 31.7*  MCV 95.8  90.4 90.8 90.1 88.5  PLT 285 327 366 353 381    Basic Metabolic Panel: Recent Labs  Lab 05/09/23 0501 05/10/23 0536 05/11/23 0312 05/12/23 0745 05/12/23 1435 05/13/23 0356  NA 135 131* 129* 126* 127* 127*  K 3.0* 3.3* 3.5 3.3* 3.0* 3.8  CL 93* 86* 86* 85* 79* 88*  CO2 28 29 30 27 31 24   GLUCOSE 114* 228* 203* 244* 159* 131*  BUN 38* 48* 51* 60* 65* 38*  CREATININE 2.92* 3.10* 3.38* 3.67* 3.73* 3.12*  CALCIUM 8.9 9.0 9.1 9.3 9.3 8.9  MG 2.1  --  2.2  --   --  2.0  PHOS 5.2* 5.6* 5.7* 5.8* 6.4* 4.6    Liver Function Tests: Recent Labs  Lab 05/07/23 0532 05/08/23 0636 05/10/23 0536 05/11/23 0312 05/12/23 0745 05/12/23 1435 05/13/23 0356  AST 27  --   --   --   --   --   --   ALT 22  --   --   --   --   --   --  ALKPHOS 63  --   --   --   --   --   --   BILITOT 0.7  --   --   --   --   --   --   PROT 6.4*  --   --   --   --   --   --   ALBUMIN 3.1*   < > 3.1* 3.3* 3.5 3.4* 3.3*   < > = values in this interval not displayed.     Radiology Studies: No results found.    LOS: 8 days    Huey Bienenstock, MD [facility] Available via Epic secure chat 7am-7pm After these hours, please refer to coverage provider listed on [email or web address] 05/13/2023, 1:54 PM

## 2023-05-13 NOTE — Progress Notes (Signed)
[facility] Progress Note   Assessment/ Plan:   Samantha [name] is a/an 72 y.o. female with a past medical history significant for HTN, DM2, PAD, CVA, HLD, CKD 3B who presents with shortness of breath and concern for volume overload   The patient was recently hospitalized prior to this hospitalization for E. coli UTI and pyelonephritis.  She also was taking an ARB as well as NSAIDs at home.  She had an AKI and was given IV fluids.  She developed some volume overload and IV fluids were stopped.  Her creatinine was 2.9 at discharge.  When she read presented for this hospitalization her creatinine was 2.4.  She was taking Lasix 40 mg daily but had ran out a couple days before arriving.         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  Creatinine up to 2.9 at this time.   -Temporary catheter placed by CCM on 5/11, appreciate help - looks like she is improving with UOP-- continue Lasix 80 IV BID for now - adding fluid restriction -Continue to monitor daily Cr, Dose meds for GFR -Monitor Daily I/Os, Daily weight  -Maintain MAP>65 for optimal renal perfusion.  -Avoid nephrotoxic medications including NSAIDs -Use synthetic opioids (Fentanyl/Dilaudid) if needed -Serologies have been sent to workup for possible glomerular disease but seems unlikely given no significant hematuria.  Proteinuria is most likely related to her history of diabetes and recent tubular injury. - I anticipated her labs getting better- but they are not - HD #1 05/12/23 - HD #2 05/14/23--> will put in for first shift   Acute hypoxic respiratory failure.  Most likely related to volume overload.  Appreciate help from pulmonary critical care team.  Seems to be improving after thoracentesis and diuresis.  Continue with diuresis as above   Volume overload: Mostly as above.  Of note, ejection fraction is normal.  Echocardiogram did demonstrate that IVC was distended.   She seems to be improving with diuresis as well as thoracentesis.  Continue diuretics as above   Uncontrolled Diabetes Mellitus Type 2 with Hyperglycemia: Management per primary team   HTN: Medications per primary on carvedilol   Metabolic acidosis: Resolved.   Abdominal distention/fullness: Possible constipation.  Titration of medications per primary team  Subjective:    Did really well with HD yesterday- Feels much better- less heavy in her stomach, mentally less foggy.     Objective:   BP (!) 121/55 (BP Location: Left Arm)   Pulse 77   Temp 98 F (36.7 C) (Oral)   Resp 19   Ht 5\' 5"  (1.651 m)   Wt 66.7 kg   SpO2 95%   BMI 24.47 kg/m   Intake/Output Summary (Last 24 hours) at 05/13/2023 1220 Last data filed at 05/13/2023 0600 Gross per 24 hour  Intake 120 ml  Output 1350 ml  Net -1230 ml   Weight change:   Physical Exam: Gen:NAD, on O2 CVS:RRR Resp: normal WOB Abd: soft, not as distended Ext: no LE edema ACCESS: R IJ nontunneled HD catheter  Imaging: No results found.  Labs: BMET Recent Labs  Lab 05/08/23 0636 05/08/23 1448 05/09/23 0501 05/10/23 0536 05/11/23 0312 05/12/23 0745 05/12/23 1435 05/13/23 0356  NA 134* 134* 135 131* 129* 126* 127* 127*  K 3.9 3.5 3.0* 3.3* 3.5 3.3* 3.0* 3.8  CL 98 97* 93* 86* 86* 85* 79* 88*  CO2 26 26 28 29 30 27 31  24  GLUCOSE 118* 136* 114* 228* 203* 244* 159* 131*  BUN 35* 37* 38* 48* 51* 60* 65* 38*  CREATININE 2.90* 2.99* 2.92* 3.10* 3.38* 3.67* 3.73* 3.12*  CALCIUM 8.5* 8.7* 8.9 9.0 9.1 9.3 9.3 8.9  PHOS 5.3*  --  5.2* 5.6* 5.7* 5.8* 6.4* 4.6   CBC Recent Labs  Lab 05/09/23 0501 05/10/23 0536 05/11/23 0312 05/12/23 1435  WBC 7.9 12.1* 7.4 9.3  HGB 9.4* 10.6* 10.8* 11.1*  HCT 27.4* 30.6* 31.0* 31.7*  MCV 90.4 90.8 90.1 88.5  PLT 327 366 353 381    Medications:     atorvastatin  80 mg Oral Daily   bisacodyl  10 mg Rectal Daily   carvedilol  6.25 mg Oral BID WC   Chlorhexidine Gluconate Cloth  6  each Topical Q0600   Chlorhexidine Gluconate Cloth  6 each Topical Q0600   clopidogrel  75 mg Oral Daily   diphenhydrAMINE  25 mg Intravenous Once   docusate sodium  100 mg Oral BID   fluticasone  2 spray Each Nare Daily   furosemide  80 mg Intravenous BID   guaiFENesin  600 mg Oral BID   heparin  5,000 Units Subcutaneous Q8H   insulin aspart  0-15 Units Subcutaneous TID WC   insulin aspart  0-5 Units Subcutaneous QHS   insulin aspart  3 Units Subcutaneous TID WC   insulin glargine-yfgn  15 Units Subcutaneous Daily   oxymetazoline  1 spray Each Nare Once   sodium chloride flush  3 mL Intravenous Q12H    Bufford Buttner MD 05/13/2023, 12:20 PM

## 2023-05-14 LAB — GLUCOSE, CAPILLARY
Glucose-Capillary: 173 mg/dL — ABNORMAL HIGH (ref 70–99)
Glucose-Capillary: 183 mg/dL — ABNORMAL HIGH (ref 70–99)
Glucose-Capillary: 273 mg/dL — ABNORMAL HIGH (ref 70–99)
Glucose-Capillary: 106 mg/dL — ABNORMAL HIGH (ref 70–99)

## 2023-05-14 LAB — CBC
HCT: 30.5 % — ABNORMAL LOW (ref 36.0–46.0)
Hemoglobin: 10.6 g/dL — ABNORMAL LOW (ref 12.0–15.0)
MCH: 31.2 pg (ref 26.0–34.0)
MCHC: 34.8 g/dL (ref 30.0–36.0)
MCV: 89.7 fL (ref 80.0–100.0)
Platelets: 321 10*3/uL (ref 150–400)
RBC: 3.4 MIL/uL — ABNORMAL LOW (ref 3.87–5.11)
RDW: 12.6 % (ref 11.5–15.5)
WBC: 8 10*3/uL (ref 4.0–10.5)
nRBC: 0 % (ref 0.0–0.2)

## 2023-05-14 LAB — RENAL FUNCTION PANEL
Albumin: 3.2 g/dL — ABNORMAL LOW (ref 3.5–5.0)
Anion gap: 14 (ref 5–15)
BUN: 51 mg/dL — ABNORMAL HIGH (ref 8–23)
CO2: 25 mmol/L (ref 22–32)
Calcium: 8.9 mg/dL (ref 8.9–10.3)
Chloride: 86 mmol/L — ABNORMAL LOW (ref 98–111)
Creatinine, Ser: 3.65 mg/dL — ABNORMAL HIGH (ref 0.44–1.00)
GFR, Estimated: 13 mL/min — ABNORMAL LOW (ref >60)
Glucose, Bld: 243 mg/dL — ABNORMAL HIGH (ref 70–99)
Phosphorus: 5.4 mg/dL — ABNORMAL HIGH (ref 2.5–4.6)
Potassium: 3.4 mmol/L — ABNORMAL LOW (ref 3.5–5.1)
Sodium: 125 mmol/L — ABNORMAL LOW (ref 135–145)

## 2023-05-14 NOTE — Progress Notes (Signed)
Received patient in bed to unit.  Alert and oriented.  Informed consent signed and in chart.   TX duration: 3hrs  Patient tolerated well.  Alert, without acute distress.  Hand-off given to patient's nurse.   Access used: R triple lumen IJ  Total UF removed: 1400 Medication(s) given: None  Post HD weight: 64.4kg   05/14/23 1201  Vitals  Temp 98 F (36.7 C)  Temp Source Oral  BP (!) 144/77  MAP (mmHg) 96  BP Location Left Arm  BP Method Automatic  Patient Position (if appropriate) Lying  Pulse Rate 75  Pulse Rate Source Monitor  ECG Heart Rate 75  Resp 17  Oxygen Therapy  SpO2 96 %  O2 Device Room Air  Patient Activity (if Appropriate) In bed  Pulse Oximetry Type Continuous  During Treatment Monitoring  Intra-Hemodialysis Comments Tx completed;Tolerated well  Post Treatment  Dialyzer Clearance Lightly streaked  Duration of HD Treatment -hour(s) 3 hour(s)  Liters Processed 54  Fluid Removed (mL) 1400 mL  Tolerated HD Treatment Yes  Hemodialysis Catheter Right Internal jugular Triple lumen Temporary (Non-Tunneled)  Placement Date/Time: 05/08/23 1400   Placed prior to admission: No  Time Out: Correct patient;Correct site;Correct procedure  Maximum sterile barrier precautions: Hand hygiene;Cap;Mask;Sterile gown;Sterile gloves;Large sterile sheet  Site Prep: Chlorh...  Site Condition No complications  Blue Lumen Status Dead end cap in place;Heparin locked  Red Lumen Status Dead end cap in place;Heparin locked  Purple Lumen Status Saline locked  Catheter fill solution Heparin 1000 units/ml  Catheter fill volume (Arterial) 1.2 cc  Catheter fill volume (Venous) 1.2  Dressing Type Gauze/Drain sponge  Dressing Status Clean, Dry, Intact;Antimicrobial disc in place  Interventions Other (Comment)  Drainage Description None  Dressing Change Due 05/20/23  Post treatment catheter status Capped and Clamped     [facility]

## 2023-05-14 NOTE — Progress Notes (Signed)
[facility] Progress Note   Assessment/ Plan:   Samantha [name] is a/an 72 y.o. female with a past medical history significant for HTN, DM2, PAD, CVA, HLD, CKD 3B who presents with shortness of breath and concern for volume overload   The patient was recently hospitalized prior to this hospitalization for E. coli UTI and pyelonephritis.  She also was taking an ARB as well as NSAIDs at home.  She had an AKI and was given IV fluids.  She developed some volume overload and IV fluids were stopped.  Her creatinine was 2.9 at discharge.  When she read presented for this hospitalization her creatinine was 2.4.  She was taking Lasix 40 mg daily but had ran out a couple days before arriving.         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  Creatinine up to 2.9 at this time.   -Temporary catheter placed by CCM on 5/11, appreciate help - looks like she is improving with UOP-- continue Lasix 80 IV BID for now - adding fluid restriction -Continue to monitor daily Cr, Dose meds for GFR -Monitor Daily I/Os, Daily weight  -Maintain MAP>65 for optimal renal perfusion.  -Avoid nephrotoxic medications including NSAIDs -Use synthetic opioids (Fentanyl/Dilaudid) if needed -Serologies have been sent to workup for possible glomerular disease but seems unlikely given no significant hematuria.  Proteinuria is most likely related to her history of diabetes and recent tubular injury. - I anticipated her labs getting better- but they are not - HD #1 05/12/23 - HD #2 05/14/23 - observe over the weekend and then make a determination if more HD is needed- will Lasix challenge and trend UOP/Cr   Acute hypoxic respiratory failure.  Most likely related to volume overload.  Appreciate help from pulmonary critical care team.  Seems to be improving after thoracentesis and diuresis.  Continue with diuresis as above   Volume overload: Mostly as above.  Of note,  ejection fraction is normal.  Echocardiogram did demonstrate that IVC was distended.  She seems to be improving with diuresis as well as thoracentesis.  Continue diuretics as above   Uncontrolled Diabetes Mellitus Type 2 with Hyperglycemia: Management per primary team   HTN: Medications per primary on carvedilol   Metabolic acidosis: Resolved.   Abdominal distention/fullness:  Improving with Bms and dialysis  Subjective:    Seen on HD today.  Feeling OK today- a little bit of a headache but otherwise OK   Objective:   BP 138/72   Pulse 73   Temp 98.2 F (36.8 C) (Oral)   Resp (!) 0   Ht 5\' 5"  (1.651 m)   Wt 65.8 kg   SpO2 99%   BMI 24.14 kg/m   Intake/Output Summary (Last 24 hours) at 05/14/2023 1059 Last data filed at 05/14/2023 1610 Gross per 24 hour  Intake 598 ml  Output --  Net 598 ml   Weight change: -1.5 kg  Physical Exam: Gen:NAD, on O2 CVS:RRR Resp: normal WOB Abd: soft, not as distended Ext: no LE edema ACCESS: R IJ nontunneled HD catheter  Imaging: No results found.  Labs: BMET Recent Labs  Lab 05/09/23 0501 05/10/23 0536 05/11/23 0312 05/12/23 0745 05/12/23 1435 05/13/23 0356 05/14/23 0840  NA 135 131* 129* 126* 127* 127* 125*  K 3.0* 3.3* 3.5 3.3* 3.0* 3.8 3.4*  CL 93* 86* 86* 85* 79* 88* 86*  CO2 28 29 30 27 31 24  25  GLUCOSE 114* 228* 203* 244* 159* 131* 243*  BUN 38* 48* 51* 60* 65* 38* 51*  CREATININE 2.92* 3.10* 3.38* 3.67* 3.73* 3.12* 3.65*  CALCIUM 8.9 9.0 9.1 9.3 9.3 8.9 8.9  PHOS 5.2* 5.6* 5.7* 5.8* 6.4* 4.6 5.4*   CBC Recent Labs  Lab 05/10/23 0536 05/11/23 0312 05/12/23 1435 05/14/23 0840  WBC 12.1* 7.4 9.3 8.0  HGB 10.6* 10.8* 11.1* 10.6*  HCT 30.6* 31.0* 31.7* 30.5*  MCV 90.8 90.1 88.5 89.7  PLT 366 353 381 321    Medications:     atorvastatin  80 mg Oral Daily   bisacodyl  10 mg Rectal Daily   carvedilol  6.25 mg Oral BID WC   Chlorhexidine Gluconate Cloth  6 each Topical Q0600   Chlorhexidine Gluconate  Cloth  6 each Topical Q0600   clopidogrel  75 mg Oral Daily   diphenhydrAMINE  25 mg Intravenous Once   docusate sodium  100 mg Oral BID   fluticasone  2 spray Each Nare Daily   furosemide  80 mg Intravenous BID   guaiFENesin  600 mg Oral BID   heparin  5,000 Units Subcutaneous Q8H   insulin aspart  0-15 Units Subcutaneous TID WC   insulin aspart  0-5 Units Subcutaneous QHS   insulin aspart  3 Units Subcutaneous TID WC   insulin glargine-yfgn  15 Units Subcutaneous Daily   oxymetazoline  1 spray Each Nare Once   polyethylene glycol  17 g Oral BID   sodium chloride flush  3 mL Intravenous Q12H    Bufford Buttner MD 05/14/2023, 10:59 AM

## 2023-05-14 NOTE — Procedures (Signed)
Patient seen and examined on Hemodialysis. The procedure was supervised and I have made appropriate changes. BP 138/72   Pulse 73   Temp 98.2 F (36.8 C) (Oral)   Resp 14   Ht 5\' 5"  (1.651 m)   Wt 65.8 kg   SpO2 99%   BMI 24.14 kg/m   QB 300 mL/ min via R IJ nontunneled HD cath, UF goal 1.5L  Tolerating treatment without complaints at this time.   Bufford Buttner MD  [facility] [phone] 11:03 AM

## 2023-05-14 NOTE — Progress Notes (Signed)
PROGRESS NOTE    Samantha [name]  QMV:[id number] DOB: 1951-04-07 DOA: 05/05/2023 PCP: Hoy Register, MD    Brief Narrative:   Patient is a 72 year old female with past medical history of PAD, hypertension, type 2 diabetes, history of CVA, depression and anxiety was recently discharged from the hospital on 04/18/23 after treatment for UTI/pyelonephritis, pulmonary edema and was given prescription for Lasix for 7 days.  This time again presented with worsening abdominal discomfort chest discomfort and chills. CT chest abdomen/pelvis was done which showed pulmonary edema versus bibasilar infiltrates.  BNP was elevated to 600.  Patient had initial persistent pulmonary edema, acute hypoxic respiratory failure requiring 15 L of supplemental oxygen, was transferred to the ICU.  During hospital stay, patient underwent bilateral thoracocentesis with removal of1 L of pleural fluid from the left and 1.5 L from the right.  2D echocardiogram showed diastolic dysfunction and moderate pulmonary hypertension.  Patient was also seen by nephrology during hospitalization,  was put on high-dose IV Lasix fortunately she remains with low urinary output, was started on HD 05/12/2023.  Assessment and plan.  Oliguric AKI on CKD stage IIIb -Baseline creatinine 1.7, peaked today at 3.7-with minimal urine output, 200 cc over last 24 hours, unfortunately creatinine trending up, urine output is decreasing, and sodium is down, management per renal, patient started on hemodialysis 5/15, she received 2 sessions for far, and plan to observe over the weekend, and then to make determination if more HD is needed depends on her urine output and creatinine.    Acute hypoxemic respiratory failure Secondary pulmonary edema, bilateral pleural effusion, BNP elevated 601.  procalcitonin less than 0.10.  Continue IV Lasix high-dose..  Was initially on 15 L of oxygen but has been weaned down to room air. Status post bilateral  thoracocentesis.  Patient had hemodialysis catheter placed in by nephrology.  Patient has received IV Lasix high-dose which has been decreased to 80 milligram twice daily at this time.  Pleural fluid analysis transudative in nature.  Mild hypokalemia.  Improved after replacement.    Mild hyponatremia will continue to monitor closely.  Nephrology on board.   Pulmonary edema/acute on chronic diastolic heart failure: Nephrology on board.  Has been diuresing. Review of recent echocardiogram revealed EF 55 to 60%, mild LVH, left ventricular diastolic parameters indeterminate and RVSP of 46.4 mmHg.  Negative balance for 8545 ml.  On IV Lasix 80 mg twice daily.  Nephrology following.   Concern for pneumonia: -Chest x-ray showed bibasilar patchy opacities, concern for pneumonia. Received 1 dose of Rocephin and Zithromax in the ED.  Procalcitonin less than 0.1.  Antibiotics discontinued.  Blood cultures negative in 5 days.   Abdominal pain, abdominal fullness secondary to constipation.  Continue bowel regimen.  History of diabetes.  Will continue on aggressive bowel regimen.  Patient needed enema and manual disimpaction initially.  Will continue with MiraLAX twice daily and add Dulcolax suppository daily.  Urinalysis 5 days back without any evidence of infection.  Diabetes mellitus type 2: Continue sliding scale insulin Accu-Cheks.  Glimepiride on hold   History of CAD: -Continue Plavix   Hypertension: Blood pressure on the lower side, continue to hold amlodipine, Coreg dose has been decreased from 25 mg p.o. twice daily to 6.25 mg p.o. twice daily.       DVT prophylaxis: heparin injection 5,000 Units Start: 05/09/23 0600   Code Status:     Code Status: Full Code  Disposition: Home likely in 1 to 2 days/when okay with nephrology.  Status is: Inpatient Remains inpatient appropriate because: Volume overload, IV Lasix, electrolyte imbalance,  None at bedside today  Consultants:   Nephrology.  Procedures:  Right chest wall hemodialysis catheter placement.  Antimicrobials:  None  Anti-infectives (From admission, onward)    Start     Dose/Rate Route Frequency Ordered Stop   05/08/23 1000  ceFAZolin (ANCEF) IVPB 2g/100 mL premix        2 g 200 mL/hr over 30 Minutes Intravenous To Radiology 05/07/23 1537 05/09/23 1000   05/05/23 1515  cefTRIAXone (ROCEPHIN) 1 g in sodium chloride 0.9 % 100 mL IVPB        1 g 200 mL/hr over 30 Minutes Intravenous  Once 05/05/23 1510 05/05/23 1642   05/05/23 1515  azithromycin (ZITHROMAX) 500 mg in sodium chloride 0.9 % 250 mL IVPB        500 mg 250 mL/hr over 60 Minutes Intravenous  Once 05/05/23 1510 05/05/23 1750       Subjective:  No significant events overnight, she still reports poor appetite, and nausea with food.  Vitals:   05/14/23 1137 05/14/23 1201 05/14/23 1207 05/14/23 1233  BP: 138/74 (!) 144/77 139/67 93/76  Pulse: 78 75 74 80  Resp: 10 17 14 14   Temp:  98 F (36.7 C)  99.1 F (37.3 C)  TempSrc:  Oral  Oral  SpO2: 98% 96% 99% 97%  Weight:  64.4 kg    Height:        Intake/Output Summary (Last 24 hours) at 05/14/2023 1349 Last data filed at 05/14/2023 1201 Gross per 24 hour  Intake 598 ml  Output 1400 ml  Net -802 ml   Filed Weights   05/14/23 0500 05/14/23 0856 05/14/23 1201  Weight: 64.6 kg 65.8 kg 64.4 kg    Physical Examination: Body mass index is 23.63 kg/m.   Awake Alert, Oriented X 3, No new F.N deficits, Normal affect Symmetrical Chest wall movement, Good air movement bilaterally, CTAB RRR,No Gallops,Rubs or new Murmurs, No Parasternal Heave +ve B.Sounds, Abd Soft, No tenderness, No rebound - guarding or rigidity. No Cyanosis, Clubbing or edema, No new Rash or bruise       Data Reviewed:   CBC: Recent Labs  Lab 05/09/23 0501 05/10/23 0536 05/11/23 0312 05/12/23 1435 05/14/23 0840  WBC 7.9 12.1* 7.4 9.3 8.0  HGB 9.4* 10.6* 10.8* 11.1* 10.6*  HCT 27.4* 30.6* 31.0*  31.7* 30.5*  MCV 90.4 90.8 90.1 88.5 89.7  PLT 327 366 353 381 321    Basic Metabolic Panel: Recent Labs  Lab 05/09/23 0501 05/10/23 0536 05/11/23 0312 05/12/23 0745 05/12/23 1435 05/13/23 0356 05/14/23 0840  NA 135   < > 129* 126* 127* 127* 125*  K 3.0*   < > 3.5 3.3* 3.0* 3.8 3.4*  CL 93*   < > 86* 85* 79* 88* 86*  CO2 28   < > 30 27 31 24 25   GLUCOSE 114*   < > 203* 244* 159* 131* 243*  BUN 38*   < > 51* 60* 65* 38* 51*  CREATININE 2.92*   < > 3.38* 3.67* 3.73* 3.12* 3.65*  CALCIUM 8.9   < > 9.1 9.3 9.3 8.9 8.9  MG 2.1  --  2.2  --   --  2.0  --   PHOS 5.2*   < > 5.7* 5.8* 6.4* 4.6 5.4*   < > = values in this interval not displayed.    Liver Function Tests: Recent Labs  Lab 05/11/23 [phone]  05/12/23 0745 05/12/23 1435 05/13/23 0356 05/14/23 0840  ALBUMIN 3.3* 3.5 3.4* 3.3* 3.2*     Radiology Studies: No results found.    LOS: 9 days    Huey Bienenstock, MD [facility] Available via Epic secure chat 7am-7pm After these hours, please refer to coverage provider listed on [email or web address] 05/14/2023, 1:49 PM

## 2023-05-15 LAB — GLUCOSE, CAPILLARY
Glucose-Capillary: 144 mg/dL — ABNORMAL HIGH (ref 70–99)
Glucose-Capillary: 205 mg/dL — ABNORMAL HIGH (ref 70–99)
Glucose-Capillary: 171 mg/dL — ABNORMAL HIGH (ref 70–99)
Glucose-Capillary: 219 mg/dL — ABNORMAL HIGH (ref 70–99)

## 2023-05-15 LAB — RENAL FUNCTION PANEL
Albumin: 3.3 g/dL — ABNORMAL LOW (ref 3.5–5.0)
Anion gap: 12 (ref 5–15)
BUN: 30 mg/dL — ABNORMAL HIGH (ref 8–23)
CO2: 26 mmol/L (ref 22–32)
Calcium: 9.1 mg/dL (ref 8.9–10.3)
Chloride: 92 mmol/L — ABNORMAL LOW (ref 98–111)
Creatinine, Ser: 3.06 mg/dL — ABNORMAL HIGH (ref 0.44–1.00)
GFR, Estimated: 16 mL/min — ABNORMAL LOW (ref >60)
Glucose, Bld: 135 mg/dL — ABNORMAL HIGH (ref 70–99)
Phosphorus: 4.8 mg/dL — ABNORMAL HIGH (ref 2.5–4.6)
Potassium: 3.3 mmol/L — ABNORMAL LOW (ref 3.5–5.1)
Sodium: 130 mmol/L — ABNORMAL LOW (ref 135–145)

## 2023-05-15 MED ORDER — POTASSIUM CHLORIDE 20 MEQ PO PACK
20.0000 meq | PACK | Freq: Once | ORAL | Status: AC
  Administered 2023-05-15: 20 meq via ORAL
  Filled 2023-05-15: qty 1

## 2023-05-15 MED ORDER — LORAZEPAM 0.5 MG PO TABS: 0.5000 mg | ORAL_TABLET | Freq: Four times a day (QID) | ORAL | Status: DC | PRN

## 2023-05-15 MED ORDER — MELATONIN 3 MG PO TABS: 3.0000 mg | ORAL_TABLET | Freq: Every evening | ORAL | Status: DC | PRN

## 2023-05-15 NOTE — Progress Notes (Signed)
PROGRESS NOTE    Samantha [name]  OZH:[id number] DOB: 1951/01/01 DOA: 05/05/2023 PCP: Hoy Register, MD    Brief Narrative:   Patient is a 72 year old female with past medical history of PAD, hypertension, type 2 diabetes, history of CVA, depression and anxiety was recently discharged from the hospital on 04/18/23 after treatment for UTI/pyelonephritis, pulmonary edema and was given prescription for Lasix for 7 days.  This time again presented with worsening abdominal discomfort chest discomfort and chills. CT chest abdomen/pelvis was done which showed pulmonary edema versus bibasilar infiltrates.  BNP was elevated to 600.  Patient had initial persistent pulmonary edema, acute hypoxic respiratory failure requiring 15 L of supplemental oxygen, was transferred to the ICU.  During hospital stay, patient underwent bilateral thoracocentesis with removal of1 L of pleural fluid from the left and 1.5 L from the right.  2D echocardiogram showed diastolic dysfunction and moderate pulmonary hypertension.  Patient was also seen by nephrology during hospitalization,  was put on high-dose IV Lasix fortunately she remains with low urinary output, was started on HD 05/12/2023.  Assessment and plan.  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 -Admit for HD, she received dialysis twice 5/15, and 5/17, plan to observe over the weekend without HD to make determination of more HD is needed, on Lasix challenge currently, monitor urine output closely and creatinine trend.   -Will need TDC if further HD is needed   Acute hypoxemic respiratory failure Secondary pulmonary edema, bilateral pleural effusion, BNP elevated 601.  procalcitonin less than 0.10.  Continue IV Lasix high-dose..  Was initially on 15 L of oxygen but has been weaned down to room  air. Status post bilateral thoracocentesis.  Patient had hemodialysis catheter placed in by nephrology.  Patient has received IV Lasix high-dose which has been decreased to 80 milligram twice daily at this time.  Pleural fluid analysis transudative in nature.  Mild hypokalemia.  Improved after replacement.    Mild hyponatremia will continue to monitor closely.  Nephrology on board.   Pulmonary edema/acute on chronic diastolic heart failure: Nephrology on board.  Has been diuresing. Review of recent echocardiogram revealed EF 55 to 60%, mild LVH, left ventricular diastolic parameters indeterminate and RVSP of 46.4 mmHg.  Negative balance for 8545 ml.  On IV Lasix 80 mg twice daily.  Nephrology following.   Concern for pneumonia: -Chest x-ray showed bibasilar patchy opacities, concern for pneumonia. Received 1 dose of Rocephin and Zithromax in the ED.  Procalcitonin less than 0.1.  Antibiotics discontinued.  Blood cultures negative in 5 days.   Abdominal pain, abdominal fullness secondary to constipation.  Continue bowel regimen.  History of diabetes.  Will continue on aggressive bowel regimen.  Patient needed enema and manual disimpaction initially.  Will continue with MiraLAX twice daily and add Dulcolax suppository daily.  Urinalysis 5 days back without any evidence of infection.  Diabetes mellitus type 2: Continue sliding scale insulin Accu-Cheks.  Glimepiride on hold   History of CAD: -Continue Plavix   Hypertension: Blood pressure on the lower side, continue to hold amlodipine, Coreg dose has been decreased from 25 mg p.o. twice daily to 6.25 mg p.o. twice daily.    Possible restless leg syndrome -Patient report symptoms of lower extremity cramps preceded dialysis, has been going on for few month, will monitor over the weekend if still persistent  despite being off dialysis will consider starting Requip     DVT prophylaxis: heparin injection 5,000 Units Start: 05/09/23 0600   Code  Status:     Code Status: Full Code  Disposition: Home likely in 1 to 2 days/when okay with nephrology.  Status is: Inpatient Remains inpatient appropriate because: Volume overload, IV Lasix, electrolyte imbalance,  None at bedside today  Consultants:  Nephrology.  Procedures:  Right chest wall hemodialysis catheter placement.  Antimicrobials:  None  Anti-infectives (From admission, onward)    Start     Dose/Rate Route Frequency Ordered Stop   05/08/23 1000  ceFAZolin (ANCEF) IVPB 2g/100 mL premix        2 g 200 mL/hr over 30 Minutes Intravenous To Radiology 05/07/23 1537 05/09/23 1000   05/05/23 1515  cefTRIAXone (ROCEPHIN) 1 g in sodium chloride 0.9 % 100 mL IVPB        1 g 200 mL/hr over 30 Minutes Intravenous  Once 05/05/23 1510 05/05/23 1642   05/05/23 1515  azithromycin (ZITHROMAX) 500 mg in sodium chloride 0.9 % 250 mL IVPB        500 mg 250 mL/hr over 60 Minutes Intravenous  Once 05/05/23 1510 05/05/23 1750       Subjective:  Reports nausea, poor appetite, reports leg spasm yesterday after dialysis, but it does appear to be more chronic in nature right leg stress leg syndrome.     Vitals:   05/15/23 0000 05/15/23 0200 05/15/23 1002 05/15/23 1237  BP: (!) 128/97  125/72 132/72  Pulse: 73 74  80  Resp: 18 11  20   Temp:   98.3 F (36.8 C) 99.3 F (37.4 C)  TempSrc:   Oral Oral  SpO2: 95% 96%  98%  Weight:      Height:        Intake/Output Summary (Last 24 hours) at 05/15/2023 1251 Last data filed at 05/14/2023 1857 Gross per 24 hour  Intake 240 ml  Output --  Net 240 ml   Filed Weights   05/14/23 0500 05/14/23 0856 05/14/23 1201  Weight: 64.6 kg 65.8 kg 64.4 kg    Physical Examination: Body mass index is 23.63 kg/m.   Awake Alert, Oriented X 3, frail, chronically ill-appearing Symmetrical Chest wall movement, Good air movement bilaterally, CTAB RRR,No Gallops,Rubs or new Murmurs, No Parasternal Heave +ve B.Sounds, Abd Soft, No tenderness, No  rebound - guarding or rigidity. No Cyanosis, Clubbing or edema, No new Rash or bruise        Data Reviewed:   CBC: Recent Labs  Lab 05/09/23 0501 05/10/23 0536 05/11/23 0312 05/12/23 1435 05/14/23 0840  WBC 7.9 12.1* 7.4 9.3 8.0  HGB 9.4* 10.6* 10.8* 11.1* 10.6*  HCT 27.4* 30.6* 31.0* 31.7* 30.5*  MCV 90.4 90.8 90.1 88.5 89.7  PLT 327 366 353 381 321    Basic Metabolic Panel: Recent Labs  Lab 05/09/23 0501 05/10/23 0536 05/11/23 0312 05/12/23 0745 05/12/23 1435 05/13/23 0356 05/14/23 0840 05/15/23 0337  NA 135   < > 129* 126* 127* 127* 125* 130*  K 3.0*   < > 3.5 3.3* 3.0* 3.8 3.4* 3.3*  CL 93*   < > 86* 85* 79* 88* 86* 92*  CO2 28   < > 30 27 31 24 25 26   GLUCOSE 114*   < > 203* 244* 159* 131* 243* 135*  BUN 38*   < > 51* 60* 65* 38* 51* 30*  CREATININE 2.92*   < > 3.38* 3.67* 3.73* 3.12*  3.65* 3.06*  CALCIUM 8.9   < > 9.1 9.3 9.3 8.9 8.9 9.1  MG 2.1  --  2.2  --   --  2.0  --   --   PHOS 5.2*   < > 5.7* 5.8* 6.4* 4.6 5.4* 4.8*   < > = values in this interval not displayed.    Liver Function Tests: Recent Labs  Lab 05/12/23 0745 05/12/23 1435 05/13/23 0356 05/14/23 0840 05/15/23 0337  ALBUMIN 3.5 3.4* 3.3* 3.2* 3.3*     Radiology Studies: No results found.    LOS: 10 days    Huey Bienenstock, MD [facility] Available via Epic secure chat 7am-7pm After these hours, please refer to coverage provider listed on [email or web address] 05/15/2023, 12:51 PM

## 2023-05-15 NOTE — Progress Notes (Signed)
[facility] Progress Note   Assessment/ Plan:   Samantha [name] is a/an 72 y.o. female with a past medical history significant for HTN, DM2, PAD, CVA, HLD, CKD 3B who presents with shortness of breath and concern for volume overload   The patient was recently hospitalized prior to this hospitalization for E. coli UTI and pyelonephritis.  She also was taking an ARB as well as NSAIDs at home.  She had an AKI and was given IV fluids.  She developed some volume overload and IV fluids were stopped.  Her creatinine was 2.9 at discharge.  When she read presented for this hospitalization her creatinine was 2.4.  She was taking Lasix 40 mg daily but had ran out a couple days before arriving.         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  Creatinine up to 2.9 at this time.   -Temporary catheter placed by CCM on 5/11, appreciate help - looks like she is improving with UOP-- continue Lasix 80 IV BID for now - adding fluid restriction -Continue to monitor daily Cr, Dose meds for GFR -Monitor Daily I/Os, Daily weight  -Maintain MAP>65 for optimal renal perfusion.  -Avoid nephrotoxic medications including NSAIDs -Use synthetic opioids (Fentanyl/Dilaudid) if needed -Serologies have been sent to workup for possible glomerular disease but seems unlikely given no significant hematuria.  Proteinuria is most likely related to her history of diabetes and recent tubular injury. - I anticipated her labs getting better- but they are not - HD #1 05/12/23 - HD #2 05/14/23 - observe over the weekend and then make a determination if more HD is needed- will Lasix challenge and trend UOP/Cr - will need to do [facility] as well if further dialysis needed   Acute hypoxic respiratory failure.  Most likely related to volume overload.  Appreciate help from pulmonary critical care team.  Seems to be improving after thoracentesis and diuresis.  Continue with  diuresis as above   Volume overload: Mostly as above.  Of note, ejection fraction is normal.  Echocardiogram did demonstrate that IVC was distended.  She seems to be improving with diuresis as well as thoracentesis.  Continue diuretics as above   Uncontrolled Diabetes Mellitus Type 2 with Hyperglycemia: Management per primary team   HTN: Medications per primary on carvedilol   Metabolic acidosis: Resolved.   Abdominal distention/fullness:  Improving with Bms and dialysis  Subjective:    Tired s/p dialysis.  Discussed with pt and dtr the fluid restriction.  Will observe over the weekend- determine need for further dialysis.     Objective:   BP (!) 128/97   Pulse 74   Temp 98.6 F (37 C) (Oral)   Resp 11   Ht 5\' 5"  (1.651 m)   Wt 64.4 kg   SpO2 96%   BMI 23.63 kg/m   Intake/Output Summary (Last 24 hours) at 05/15/2023 0940 Last data filed at 05/14/2023 1857 Gross per 24 hour  Intake 480 ml  Output 1400 ml  Net -920 ml   Weight change: 1.2 kg  Physical Exam: Gen:NAD, on O2 CVS:RRR Resp: normal WOB Abd: soft, not as distended Ext: no LE edema ACCESS: R IJ nontunneled HD catheter  Imaging: No results found.  Labs: BMET Recent Labs  Lab 05/10/23 0536 05/11/23 0312 05/12/23 0745 05/12/23 1435 05/13/23 0356 05/14/23 0840 05/15/23 0337  NA 131* 129* 126* 127* 127* 125* 130*  K 3.3* 3.5 3.3* 3.0*  3.8 3.4* 3.3*  CL 86* 86* 85* 79* 88* 86* 92*  CO2 29 30 27 31 24 25 26   GLUCOSE 228* 203* 244* 159* 131* 243* 135*  BUN 48* 51* 60* 65* 38* 51* 30*  CREATININE 3.10* 3.38* 3.67* 3.73* 3.12* 3.65* 3.06*  CALCIUM 9.0 9.1 9.3 9.3 8.9 8.9 9.1  PHOS 5.6* 5.7* 5.8* 6.4* 4.6 5.4* 4.8*   CBC Recent Labs  Lab 05/10/23 0536 05/11/23 0312 05/12/23 1435 05/14/23 0840  WBC 12.1* 7.4 9.3 8.0  HGB 10.6* 10.8* 11.1* 10.6*  HCT 30.6* 31.0* 31.7* 30.5*  MCV 90.8 90.1 88.5 89.7  PLT 366 353 381 321    Medications:     atorvastatin  80 mg Oral Daily   bisacodyl  10 mg  Rectal Daily   carvedilol  6.25 mg Oral BID WC   Chlorhexidine Gluconate Cloth  6 each Topical Q0600   Chlorhexidine Gluconate Cloth  6 each Topical Q0600   clopidogrel  75 mg Oral Daily   diphenhydrAMINE  25 mg Intravenous Once   docusate sodium  100 mg Oral BID   fluticasone  2 spray Each Nare Daily   furosemide  80 mg Intravenous BID   guaiFENesin  600 mg Oral BID   heparin  5,000 Units Subcutaneous Q8H   insulin aspart  0-15 Units Subcutaneous TID WC   insulin aspart  0-5 Units Subcutaneous QHS   insulin aspart  3 Units Subcutaneous TID WC   insulin glargine-yfgn  15 Units Subcutaneous Daily   oxymetazoline  1 spray Each Nare Once   polyethylene glycol  17 g Oral BID   potassium chloride  20 mEq Oral Once   sodium chloride flush  3 mL Intravenous Q12H    Bufford Buttner MD 05/15/2023, 9:40 AM

## 2023-05-15 NOTE — Progress Notes (Signed)
Pt has PRN bipap oders, no distress noted.

## 2023-05-16 LAB — CBC
HCT: 32 % — ABNORMAL LOW (ref 36.0–46.0)
Hemoglobin: 11 g/dL — ABNORMAL LOW (ref 12.0–15.0)
MCH: 31.4 pg (ref 26.0–34.0)
MCHC: 34.4 g/dL (ref 30.0–36.0)
MCV: 91.4 fL (ref 80.0–100.0)
Platelets: 319 10*3/uL (ref 150–400)
RBC: 3.5 MIL/uL — ABNORMAL LOW (ref 3.87–5.11)
RDW: 12.9 % (ref 11.5–15.5)
WBC: 9 10*3/uL (ref 4.0–10.5)
nRBC: 0 % (ref 0.0–0.2)

## 2023-05-16 LAB — GLUCOSE, CAPILLARY
Glucose-Capillary: 189 mg/dL — ABNORMAL HIGH (ref 70–99)
Glucose-Capillary: 226 mg/dL — ABNORMAL HIGH (ref 70–99)
Glucose-Capillary: 164 mg/dL — ABNORMAL HIGH (ref 70–99)
Glucose-Capillary: 151 mg/dL — ABNORMAL HIGH (ref 70–99)

## 2023-05-16 LAB — RENAL FUNCTION PANEL
Albumin: 3.4 g/dL — ABNORMAL LOW (ref 3.5–5.0)
Anion gap: 14 (ref 5–15)
BUN: 42 mg/dL — ABNORMAL HIGH (ref 8–23)
CO2: 23 mmol/L (ref 22–32)
Calcium: 9.1 mg/dL (ref 8.9–10.3)
Chloride: 91 mmol/L — ABNORMAL LOW (ref 98–111)
Creatinine, Ser: 3.75 mg/dL — ABNORMAL HIGH (ref 0.44–1.00)
GFR, Estimated: 12 mL/min — ABNORMAL LOW (ref >60)
Glucose, Bld: 115 mg/dL — ABNORMAL HIGH (ref 70–99)
Phosphorus: 5.2 mg/dL — ABNORMAL HIGH (ref 2.5–4.6)
Potassium: 3.2 mmol/L — ABNORMAL LOW (ref 3.5–5.1)
Sodium: 128 mmol/L — ABNORMAL LOW (ref 135–145)

## 2023-05-16 MED ORDER — CEFAZOLIN SODIUM-DEXTROSE 2-4 GM/100ML-% IV SOLN
2.0000 g | INTRAVENOUS | Status: AC
  Administered 2023-05-17: 2 g via INTRAVENOUS

## 2023-05-16 NOTE — Progress Notes (Signed)
PROGRESS NOTE    Samantha [name]  UEA:[id number] DOB: 04-Apr-1951 DOA: 05/05/2023 PCP: Hoy Register, MD    Brief Narrative:   Patient is a 72 year old female with past medical history of PAD, hypertension, type 2 diabetes, history of CVA, depression and anxiety was recently discharged from the hospital on 04/18/23 after treatment for UTI/pyelonephritis, pulmonary edema and was given prescription for Lasix for 7 days.  This time again presented with worsening abdominal discomfort chest discomfort and chills. CT chest abdomen/pelvis was done which showed pulmonary edema versus bibasilar infiltrates.  BNP was elevated to 600.  Patient had initial persistent pulmonary edema, acute hypoxic respiratory failure requiring 15 L of supplemental oxygen, was transferred to the ICU.  During hospital stay, patient underwent bilateral thoracocentesis with removal of1 L of pleural fluid from the left and 1.5 L from the right.  2D echocardiogram showed diastolic dysfunction and moderate pulmonary hypertension.  Patient was also seen by nephrology during hospitalization,  was put on high-dose IV Lasix fortunately she remains with low urinary output, was started on HD 05/12/2023.  Assessment and plan.  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 -Admit for HD, she received dialysis twice 5/15, and 5/17, plan to observe over the weekend without HD to make determination of more HD is needed, on Lasix challenge currently, monitor urine output closely and creatinine trend.  Renal input appreciated, she did not do well with Lasix challenge, plan for HD tomorrow, will need TDC, IR consult placed by renal. -Will need TDC if further HD is needed   Acute hypoxemic respiratory failure Secondary pulmonary edema, bilateral pleural effusion, BNP elevated  601.  procalcitonin less than 0.10.  Continue IV Lasix high-dose..  Was initially on 15 L of oxygen but has been weaned down to room air. Status post bilateral thoracocentesis.  Patient had hemodialysis catheter placed in by nephrology.  Patient has received IV Lasix high-dose which has been decreased to 80 milligram twice daily at this time.  Pleural fluid analysis transudative in nature.  Mild hypokalemia.  Improved after replacement.    Mild hyponatremia will continue to monitor closely.  Nephrology on board.   Pulmonary edema/acute on chronic diastolic heart failure: Nephrology on board.  Has been diuresing. Review of recent echocardiogram revealed EF 55 to 60%, mild LVH, left ventricular diastolic parameters indeterminate and RVSP of 46.4 mmHg.  Negative balance for 8545 ml.  On IV Lasix 80 mg twice daily.  Nephrology following.   Concern for pneumonia: -Chest x-ray showed bibasilar patchy opacities, concern for pneumonia. Received 1 dose of Rocephin and Zithromax in the ED.  Procalcitonin less than 0.1.  Antibiotics discontinued.  Blood cultures negative in 5 days.   Abdominal pain, abdominal fullness secondary to constipation.  Continue bowel regimen.  History of diabetes.  Will continue on aggressive bowel regimen.  Patient needed enema and manual disimpaction initially.  Will continue with MiraLAX twice daily and add Dulcolax suppository daily.  Urinalysis 5 days back without any evidence of infection.  Diabetes mellitus type 2: Continue sliding scale insulin Accu-Cheks.  Glimepiride on hold   History of CAD: -Continue Plavix   Hypertension: Blood pressure on the lower side, continue to hold amlodipine, Coreg dose has been decreased from 25 mg p.o. twice daily to 6.25 mg p.o. twice daily.    Possible restless leg syndrome -Patient report  symptoms of lower extremity cramps preceded dialysis, has been going on for few month, will monitor over the weekend if still persistent despite  being off dialysis will consider starting Requip     DVT prophylaxis: heparin injection 5,000 Units Start: 05/09/23 0600   Code Status:     Code Status: Full Code  Disposition: Home likely in 1 to 2 days/when okay with nephrology.  Status is: Inpatient Remains inpatient appropriate because: Volume overload, IV Lasix, electrolyte imbalance,  None at bedside today  Consultants:  Nephrology.  Procedures:  Right chest wall hemodialysis catheter placement.  Antimicrobials:  None  Anti-infectives (From admission, onward)    Start     Dose/Rate Route Frequency Ordered Stop   05/17/23 1500  ceFAZolin (ANCEF) IVPB 2g/100 mL premix        2 g 200 mL/hr over 30 Minutes Intravenous To Radiology 05/16/23 1204 05/18/23 1500   05/08/23 1000  ceFAZolin (ANCEF) IVPB 2g/100 mL premix        2 g 200 mL/hr over 30 Minutes Intravenous To Radiology 05/07/23 1537 05/09/23 1000   05/05/23 1515  cefTRIAXone (ROCEPHIN) 1 g in sodium chloride 0.9 % 100 mL IVPB        1 g 200 mL/hr over 30 Minutes Intravenous  Once 05/05/23 1510 05/05/23 1642   05/05/23 1515  azithromycin (ZITHROMAX) 500 mg in sodium chloride 0.9 % 250 mL IVPB        500 mg 250 mL/hr over 60 Minutes Intravenous  Once 05/05/23 1510 05/05/23 1750       Subjective:  Reports nausea, poor appetite, still reports she is weak, fatigued, and feeling poorly    Vitals:   05/16/23 0347 05/16/23 0350 05/16/23 0739 05/16/23 1137  BP: (!) 129/53  (!) 126/50 130/78  Pulse: 75     Resp: 17     Temp: 97.6 F (36.4 C)     TempSrc: Oral     SpO2: 96%     Weight:  64.2 kg    Height:       No intake or output data in the 24 hours ending 05/16/23 1322  Filed Weights   05/14/23 0856 05/14/23 1201 05/16/23 0350  Weight: 65.8 kg 64.4 kg 64.2 kg    Physical Examination: Body mass index is 23.55 kg/m.   Awake Alert, Oriented X 3, frail, chronically ill-appearing Symmetrical Chest wall movement, Good air movement bilaterally,  CTAB RRR,No Gallops,Rubs or new Murmurs, No Parasternal Heave +ve B.Sounds, Abd Soft, No tenderness, No rebound - guarding or rigidity. No Cyanosis, Clubbing or edema, No new Rash or bruise         Data Reviewed:   CBC: Recent Labs  Lab 05/10/23 0536 05/11/23 0312 05/12/23 1435 05/14/23 0840 05/16/23 0325  WBC 12.1* 7.4 9.3 8.0 9.0  HGB 10.6* 10.8* 11.1* 10.6* 11.0*  HCT 30.6* 31.0* 31.7* 30.5* 32.0*  MCV 90.8 90.1 88.5 89.7 91.4  PLT 366 353 381 321 319    Basic Metabolic Panel: Recent Labs  Lab 05/11/23 0312 05/12/23 0745 05/12/23 1435 05/13/23 0356 05/14/23 0840 05/15/23 0337 05/16/23 0325  NA 129*   < > 127* 127* 125* 130* 128*  K 3.5   < > 3.0* 3.8 3.4* 3.3* 3.2*  CL 86*   < > 79* 88* 86* 92* 91*  CO2 30   < > 31 24 25 26 23   GLUCOSE 203*   < > 159* 131* 243* 135* 115*  BUN 51*   < > 65* 38*  51* 30* 42*  CREATININE 3.38*   < > 3.73* 3.12* 3.65* 3.06* 3.75*  CALCIUM 9.1   < > 9.3 8.9 8.9 9.1 9.1  MG 2.2  --   --  2.0  --   --   --   PHOS 5.7*   < > 6.4* 4.6 5.4* 4.8* 5.2*   < > = values in this interval not displayed.    Liver Function Tests: Recent Labs  Lab 05/12/23 1435 05/13/23 0356 05/14/23 0840 05/15/23 0337 05/16/23 0325  ALBUMIN 3.4* 3.3* 3.2* 3.3* 3.4*     Radiology Studies: No results found.    LOS: 11 days    Huey Bienenstock, MD [facility] Available via Epic secure chat 7am-7pm After these hours, please refer to coverage provider listed on [email or web address] 05/16/2023, 1:22 PM

## 2023-05-16 NOTE — Consult Note (Cosign Needed Addendum)
Chief Complaint: Patient was seen in consultation today for tunneled hemodialysis catheter placement Chief Complaint  Patient presents with   Abdominal Pain   Chest Pain    Referring Physician(s): Upton,E  Supervising Physician: Henn,A  Patient Status: [facility] - In-pt  History of Present Illness: Samantha [name] is a 72 y.o. female with PMH sig for HLD,HTN, PAD, DM, CVA, anxiety/depression, CAD on plavix, diastolic dysfunction, CKD, pulm HTN, recently treated pyelonephritis/UTI/pulm edema presenting now with dyspnea/ oliguric AKI on CKD, started on HD 5/15 via nontunneled HD cath placed by CCM on 5/11. Latest creat 3.75(3.06). Request now received from nephrology for conversion of nontunneled HD cath to tunneled HD cath in anticipation of outpatient need for dialysis.   Past Medical History:  Diagnosis Date   Acute pyelonephritis    Bacteremia, escherichia coli 01/12/2015   Chest pain    Critical lower limb ischemia (HCC)    Hyperlipidemia    Hypertension    Left carotid bruit    PAD (peripheral artery disease) (HCC)    Type II diabetes mellitus (HCC)     Past Surgical History:  Procedure Laterality Date   ABDOMINAL ANGIOGRAM  05/16/2015   Procedure: Abdominal Angiogram;  Surgeon: Runell Gess, MD;  Location: [facility] INVASIVE [facility];  Service: Cardiovascular;;   ABDOMINAL HYSTERECTOMY  ~ 2000   PERIPHERAL VASCULAR CATHETERIZATION Bilateral 05/16/2015   Procedure: Lower Extremity Angiography;  Surgeon: Runell Gess, MD; renal arteries widely patent, L-SFA 75%, L-pop 95%, L-peroneal 90%; R-SFA 40%, R-pop 50%, R-prox peroneal 90%; directional atherectomy L-SFA, p-pop, drug-eluting balloon angioplasty reducing the stenoses to 0, small linear dissection was not flow limiting       PERIPHERAL VASCULAR CATHETERIZATION Left 05/16/2015   Procedure: Peripheral Vascular Atherectomy;  Surgeon: Runell Gess, MD;  Location: [facility];  Service: Cardiovascular;  Laterality:  Left;  sfa    Allergies: Chicken allergy and Perflutren  Medications: Prior to Admission medications   Medication Sig Start Date End Date Taking? Authorizing Provider  amLODipine (NORVASC) 10 MG tablet Take 1 tablet (10 mg total) by mouth daily. 04/12/23  Yes Hoy Register, MD  atorvastatin (LIPITOR) 80 MG tablet Take 1 tablet (80 mg total) by mouth daily. 04/12/23  Yes Hoy Register, MD  cephALEXin (KEFLEX) 500 MG capsule Take 500 mg by mouth 2 (two) times daily.   Yes [provider]  clopidogrel (PLAVIX) 75 MG tablet Take 1 tablet (75 mg total) by mouth daily. 04/12/23  Yes Hoy Register, MD  diltiazem (CARDIZEM SR) 120 MG 12 hr capsule Take 120 mg by mouth daily.   Yes [provider]  furosemide (LASIX) 40 MG tablet Take 1 tablet (40 mg total) by mouth daily for 7 days. 04/18/23 05/05/23 Yes Burnadette Pop, MD  glimepiride (AMARYL) 4 MG tablet Take 1 tablet (4 mg total) by mouth daily with breakfast. 04/12/23  Yes Newlin, Enobong, MD  hydrALAZINE (APRESOLINE) 50 MG tablet Take 1 tablet (50 mg total) by mouth every 8 (eight) hours. 04/18/23  Yes Burnadette Pop, MD  sodium bicarbonate 650 MG tablet Take 1 tablet (650 mg total) by mouth 3 (three) times daily. 04/18/23  Yes Burnadette Pop, MD  glucose blood test strip Use 3 times daily before meals 07/21/18   Hoy Register, MD  insulin isophane & regular human KwikPen (HUMULIN 70/30 KWIKPEN) (70-30) 100 UNIT/ML KwikPen Inject 30 Units into the skin in the morning AND 25 Units every evening. Patient taking differently: Inject 30 Units into the skin  in the morning AND 26 Units every evening. 04/12/23   Hoy Register, MD  Insulin Syringe-Needle U-100 (BD INSULIN SYRINGE ULTRAFINE) 31G X 15/64" 0.5 ML MISC Use subcutaneously twice daily 01/28/17   Hoy Register, MD  TRUEPLUS LANCETS 28G MISC 1 each by Does not apply route 3 (three) times daily before meals. 12/23/16   Hoy Register, MD     Family History  Problem Relation  Age of Onset   Hypertension Mother     Social History   Socioeconomic History   Marital status: Widowed    Spouse name: Not on file   Number of children: Not on file   Years of education: Not on file   Highest education level: Not on file  Occupational History   Not on file  Tobacco Use   Smoking status: Never   Smokeless tobacco: Never  Substance and Sexual Activity   Alcohol use: Yes    Alcohol/week: 1.0 - 2.0 standard drink of alcohol    Types: 1 - 2 Cans of beer per week    Comment: occas   Drug use: No   Sexual activity: Never  Other Topics Concern   Not on file  Social History Narrative   Not on file   Social Determinants of Health   Financial Resource Strain: Not on file  Food Insecurity: No Food Insecurity (05/05/2023)   Hunger Vital Sign    Worried About Running Out of Food in the Last Year: Never true    Ran Out of Food in the Last Year: Never true  Transportation Needs: No Transportation Needs (05/05/2023)   PRAPARE - Administrator, Civil Service (Medical): No    Lack of Transportation (Non-Medical): No  Physical Activity: Not on file  Stress: Not on file  Social Connections: Not on file     Review of Systems currently denies fever,HA,CP,worsening dyspnea, cough, bad/back pain,N/V or bleeding  Vital Signs: BP 130/78 (BP Location: Left Arm)   Pulse 75   Temp 97.6 F (36.4 C) (Oral)   Resp 17   Ht 5\' 5"  (1.651 m)   Wt 141 lb 8.6 oz (64.2 kg)   SpO2 96%   BMI 23.55 kg/m   Advance Care Plan: no advance care documents on file   Physical Exam: awake/alert; chest- sl dim BS bases, heart- RRR; abd- soft,+BS,NT; no LE edema; intact rt IJ nontunneled HD cath  Imaging: NM GASTRIC EMPTYING  Result Date: 05/11/2023 CLINICAL DATA:  Concern for gastroparesis. EXAM: NUCLEAR MEDICINE GASTRIC EMPTYING SCAN TECHNIQUE: After oral ingestion of radiolabeled meal, sequential abdominal images were obtained for 60 minutes. Residual percentage of activity  remaining within the stomach was calculated at 60 minutes. Examination was terminated early due to patient emesis. RADIOPHARMACEUTICALS:  2.1 mCi Tc-65m sulfur colloid in standardized meal COMPARISON:  CT May 05, 2023 FINDINGS: Expected location of the stomach in the left upper quadrant. Ingested meal empties the stomach gradually over the course of the study with 60% retention at 60 min. IMPRESSION: Nondiagnostic study secondary to patient emesis. Electronically Signed   By: Maudry Mayhew M.D.   On: 05/11/2023 14:25   CT CHEST WO CONTRAST  Result Date: 05/09/2023 CLINICAL DATA:  Chronic dyspnea.  Chest pain. EXAM: CT CHEST WITHOUT CONTRAST TECHNIQUE: Multidetector CT imaging of the chest was performed following the standard protocol without IV contrast. RADIATION DOSE REDUCTION: This exam was performed according to the departmental dose-optimization program which includes automated exposure control, adjustment of the mA and/or kV according  to patient size and/or use of iterative reconstruction technique. COMPARISON:  CT chest abdomen and pelvis 05/05/2023 FINDINGS: Cardiovascular: Heart is enlarged. There is no pericardial effusion. Aorta is normal in size. There are atherosclerotic calcifications of the aorta and coronary arteries. Right-sided central venous catheter tip ends in the SVC. Mediastinum/Nodes: There is a subcentimeter left hypodense thyroid nodule. There are no enlarged mediastinal, hilar or axillary lymph nodes. The esophagus is within normal limits. Lungs/Pleura: There are small bilateral pleural effusions which have decreased when compared to the prior examination. There are interstitial and patchy ground-glass opacities in both mid and lower lungs involving the lower lobes, right middle lobe and inferior left upper lobe. These have increased when compared to the prior examination. Trachea and central airways are patent. There is no pneumothorax. Upper Abdomen: No acute abnormality.  Musculoskeletal: No chest wall mass or suspicious bone lesions identified. IMPRESSION: 1. Decreasing small bilateral pleural effusions. 2. Increasing interstitial and patchy ground-glass opacities in the mid and lower lungs. Findings are nonspecific and may be related to pulmonary edema or infection. 3. Stable cardiomegaly. 4. Subcentimeter left thyroid nodule. Not clinically significant; no follow-up imaging recommended (ref: J Am Coll Radiol. 2015 Feb;12(2): 143-50). Aortic Atherosclerosis (ICD10-I70.0). Electronically Signed   By: Darliss Cheney M.D.   On: 05/09/2023 23:37   DG CHEST PORT 1 VIEW  Result Date: 05/09/2023 CLINICAL DATA:  Shortness of breath EXAM: PORTABLE CHEST 1 VIEW COMPARISON:  Chest x-ray May 08, 2023 FINDINGS: No pneumothorax. Stable right central line. Mild opacity in left base is stable. Opacity in the right base is worsened in the interval. No change in cardiomegaly or cardiomediastinal silhouette. IMPRESSION: 1. Worsening opacity in the right base could represent developing pneumonia/infiltrate. Recommend attention on follow-up 2. Stable mild opacity in the left base. 3. No other changes. Electronically Signed   By: Gerome Sam III M.D.   On: 05/09/2023 10:29   DG Chest Port 1 View  Result Date: 05/08/2023 CLINICAL DATA:  Evaluate central line placement EXAM: PORTABLE CHEST 1 VIEW COMPARISON:  Chest x-ray May 06, 2023 FINDINGS: A new right central line has been placed terminating in the central SVC. No pneumothorax. Effusion and opacity on the left is less pronounced. Effusion and opacity on the right is less pronounced as well. There is some opacity in the lateral right lung base. No other interval changes or acute abnormalities. IMPRESSION: 1. The new right central line terminates in the central SVC. No pneumothorax. 2. Effusions and opacities in the lung bases are less pronounced. There is some opacity in the lateral right lung base. Electronically Signed   By: Gerome Sam  III M.D.   On: 05/08/2023 15:18   DG Chest Port 1 View  Result Date: 05/06/2023 CLINICAL DATA:  Respiratory complication. EXAM: PORTABLE CHEST 1 VIEW COMPARISON:  05/05/2023. FINDINGS: Heart is enlarged and the mediastinal contour stable. There is atherosclerotic calcification of the aorta. Patchy airspace disease is noted in the middle to lower lung fields bilaterally. Small to moderate pleural effusions are present bilaterally. No pneumothorax. IMPRESSION: 1. Stable patchy airspace disease in the mid to lower lung fields bilaterally, possible edema or infiltrate. 2. Small to moderate pleural effusions bilaterally. Electronically Signed   By: Thornell Sartorius M.D.   On: 05/06/2023 22:12   ECHOCARDIOGRAM COMPLETE  Result Date: 05/06/2023    ECHOCARDIOGRAM REPORT   Patient Name:   TREYANNA TOREN Date of Exam: 05/06/2023 Medical Rec #:  [id number]  Height:       65.0 in Accession #:    [id number]          Weight:       156.8 lb Date of Birth:  June 01, 1951           BSA:          1.784 m Patient Age:    71 years            BP:           142/76 mmHg Patient Gender: F                   HR:           53 bpm. Exam Location:  Inpatient Procedure: 2D Echo, Cardiac Doppler and Color Doppler Indications:    CHF - Acute Diastolic  History:        Patient has prior history of Echocardiogram examinations, most                 recent 11/09/2021. Hx of Aortic root aneurysm, Acute respiratory                 failure, PAD and Stroke; Risk Factors:Diabetes, Hypertension and                 Dyslipidemia.  Sonographer:    Wallie Char Referring Phys: Gomez Cleverly LAMA  Sonographer Comments: *Pt had reaction to Definity in the past* IMPRESSIONS  1. Left ventricular ejection fraction, by estimation, is 55 to 60%. The left ventricle has normal function. The left ventricle has no regional wall motion abnormalities. There is mild left ventricular hypertrophy. Left ventricular diastolic parameters are indeterminate.  2. Right  ventricular systolic function is normal. The right ventricular size is normal. There is moderately elevated pulmonary artery systolic pressure. The estimated right ventricular systolic pressure is 46.4 mmHg.  3. The mitral valve is grossly normal. Mild mitral valve regurgitation. No evidence of mitral stenosis.  4. The aortic valve is tricuspid. There is mild calcification of the aortic valve. Aortic valve regurgitation is not visualized. No aortic stenosis is present.  5. The inferior vena cava is dilated in size with <50% respiratory variability, suggesting right atrial pressure of 15 mmHg.  6. Ascending aorta measurements are within normal limits for age when indexed to body surface area. FINDINGS  Left Ventricle: Left ventricular ejection fraction, by estimation, is 55 to 60%. The left ventricle has normal function. The left ventricle has no regional wall motion abnormalities. The left ventricular internal cavity size was normal in size. There is  mild left ventricular hypertrophy. Left ventricular diastolic parameters are indeterminate. Right Ventricle: The right ventricular size is normal. No increase in right ventricular wall thickness. Right ventricular systolic function is normal. There is moderately elevated pulmonary artery systolic pressure. The tricuspid regurgitant velocity is 2.80 m/s, and with an assumed right atrial pressure of 15 mmHg, the estimated right ventricular systolic pressure is 46.4 mmHg. Left Atrium: Left atrial size was normal in size. Right Atrium: Right atrial size was normal in size. Pericardium: There is no evidence of pericardial effusion. Mitral Valve: The mitral valve is grossly normal. Mild mitral valve regurgitation. No evidence of mitral valve stenosis. MV peak gradient, 9.9 mmHg. The mean mitral valve gradient is 5.0 mmHg with average heart rate of 96 bpm. Tricuspid Valve: The tricuspid valve is normal in structure. Tricuspid valve regurgitation is trivial. No evidence of  tricuspid stenosis. Aortic Valve: The aortic  valve is tricuspid. There is mild calcification of the aortic valve. Aortic valve regurgitation is not visualized. No aortic stenosis is present. Aortic valve mean gradient measures 3.5 mmHg. Aortic valve peak gradient measures 5.3 mmHg. Aortic valve area, by VTI measures 2.13 cm. Pulmonic Valve: The pulmonic valve was normal in structure. Pulmonic valve regurgitation is not visualized. No evidence of pulmonic stenosis. Aorta: The aortic root is normal in size and structure. Ascending aorta measurements are within normal limits for age when indexed to body surface area. Venous: The inferior vena cava is dilated in size with less than 50% respiratory variability, suggesting right atrial pressure of 15 mmHg. IAS/Shunts: The interatrial septum was not well visualized.  LEFT VENTRICLE PLAX 2D LVIDd:         5.10 cm     Diastology LVIDs:         3.70 cm     LV e' medial:    7.27 cm/s LV PW:         1.00 cm     LV E/e' medial:  20.4 LV IVS:        1.20 cm     LV e' lateral:   9.98 cm/s LVOT diam:     1.80 cm     LV E/e' lateral: 14.8 LV SV:         51 LV SV Index:   29 LVOT Area:     2.54 cm  LV Volumes (MOD) LV vol d, MOD A2C: 72.6 ml LV vol d, MOD A4C: 98.3 ml LV vol s, MOD A2C: 33.3 ml LV vol s, MOD A4C: 48.4 ml LV SV MOD A2C:     39.3 ml LV SV MOD A4C:     98.3 ml LV SV MOD BP:      43.6 ml RIGHT VENTRICLE             IVC RV Basal diam:  3.90 cm     IVC diam: 2.10 cm RV S prime:     13.10 cm/s TAPSE (M-mode): 2.0 cm LEFT ATRIUM             Index        RIGHT ATRIUM           Index LA diam:        4.00 cm 2.24 cm/m   RA Area:     15.60 cm LA Vol (A2C):   49.5 ml 27.75 ml/m  RA Volume:   38.20 ml  21.42 ml/m LA Vol (A4C):   45.8 ml 25.68 ml/m LA Biplane Vol: 48.2 ml 27.02 ml/m  AORTIC VALVE AV Area (Vmax):    2.17 cm AV Area (Vmean):   2.11 cm AV Area (VTI):     2.13 cm AV Vmax:           115.00 cm/s AV Vmean:          87.200 cm/s AV VTI:            0.240 m AV Peak  Grad:      5.3 mmHg AV Mean Grad:      3.5 mmHg LVOT Vmax:         98.05 cm/s LVOT Vmean:        72.300 cm/s LVOT VTI:          0.201 m LVOT/AV VTI ratio: 0.84  AORTA Ao Root diam: 3.50 cm Ao Asc diam:  3.80 cm MITRAL VALVE  TRICUSPID VALVE MV Area (PHT): 5.31 cm       TR Peak grad:   31.4 mmHg MV Area VTI:   1.62 cm       TR Vmax:        280.00 cm/s MV Peak grad:  9.9 mmHg MV Mean grad:  5.0 mmHg       SHUNTS MV Vmax:       1.57 m/s       Systemic VTI:  0.20 m MV Vmean:      105.0 cm/s     Systemic Diam: 1.80 cm MV Decel Time: 143 msec MR Peak grad:    127.2 mmHg MR Mean grad:    83.0 mmHg MR Vmax:         564.00 cm/s MR Vmean:        424.0 cm/s MR PISA:         4.02 cm MR PISA Eff ROA: 22 mm MR PISA Radius:  0.80 cm MV E velocity: 148.00 cm/s MV A velocity: 121.00 cm/s MV E/A ratio:  1.22 Weston Brass MD Electronically signed by Weston Brass MD Signature Date/Time: 05/06/2023/2:39:59 PM    Final    US RENAL  Result Date: 05/05/2023 CLINICAL DATA:  Chronic kidney disease. EXAM: RENAL / URINARY TRACT ULTRASOUND COMPLETE COMPARISON:  CT of the chest, abdomen, pelvis on 05/05/2023 FINDINGS: Right Kidney: Renal measurements: 10.5 x 4.2 x 5.5 centimeters = volume: 125.1 mL. There is narrowing of the renal cortex. Normal renal echogenicity. No hydronephrosis or suspicious renal mass. Left Kidney: Renal measurements: 9.2 x 4.3 x 3.6 centimeters = volume: 13.8 mL. There is renal parenchymal thinning. Normal renal echogenicity. No hydronephrosis or suspicious renal mass. Bladder: Appears normal for degree of bladder distention. Other: None. IMPRESSION: No hydronephrosis. Renal parenchymal thinning bilaterally. Electronically Signed   By: Norva Pavlov M.D.   On: 05/05/2023 18:49   CT CHEST ABDOMEN PELVIS WO CONTRAST  Result Date: 05/05/2023 CLINICAL DATA:  Sepsis.  Increasing O2 requirements. EXAM: CT CHEST, ABDOMEN AND PELVIS WITHOUT CONTRAST TECHNIQUE: Multidetector CT imaging of the  chest, abdomen and pelvis was performed following the standard protocol without IV contrast. RADIATION DOSE REDUCTION: This exam was performed according to the departmental dose-optimization program which includes automated exposure control, adjustment of the mA and/or kV according to patient size and/or use of iterative reconstruction technique. COMPARISON:  Chest x-ray earlier 05/05/2023. Ultrasound abdomen 05/05/2023. CT scan 04/14/2023. FINDINGS: CT CHEST FINDINGS Cardiovascular: Heart is nonenlarged. Trace pericardial fluid or thickening. Coronary artery calcifications are seen. The thoracic aorta has a normal course and caliber with scattered vascular calcifications. Bovine type aortic arch. Significant calcified plaque of the left subclavian artery. Mediastinum/Nodes: Small hiatal hernia. Normal caliber thoracic esophagus. Heterogeneous small thyroid gland. No specific abnormal lymph node enlargement seen in the axillary region or hila on this noncontrast exam. There are some prominent less than 1 cm in size in short axis mediastinal nodes, nonpathologic by size criteria. Example right of midline, precarinal measures 8 mm in short axis on series 2, image 23. These nodes are slightly more numerous than usually seen however. Lungs/Pleura: Developing small to moderate bilateral pleural effusions, right-greater-than-left. Adjacent parenchymal opacities in the lower lobes. Consolidation is possible. There are some patchy ground-glass and interstitial changes seen along the upper lung zones with thickening of the interstitial septa. Please correlate for a component of edema. No pneumothorax. Musculoskeletal: Mild degenerative changes along the spine. CT ABDOMEN PELVIS FINDINGS Hepatobiliary: Dilated gallbladder with some dependent stones.  This was seen previously. Please correlate with separate ultrasound from 05/05/2023 earlier preserved hepatic parenchyma on this noncontrast examination otherwise. Pancreas:  Moderate atrophy of the pancreas.  No obvious mass. Spleen: Splenic granulomas. Adrenals/Urinary Tract: The adrenal glands are preserved. Mild bilateral renal atrophy with some perinephric stranding. Multiple calcifications along each kidney could be vascular rather than collecting system. No collecting system dilatation. No abnormal calcification seen within either ureter. Bladder is underdistended. There are low-lying pelvic structures. Please correlate for prolapse. Stomach/Bowel: On this non oral contrast exam, the large bowel has a normal course and caliber with scattered colonic stool. The stomach and small bowel are nondilated. Normal appendix in the right lower quadrant extending inferior to the cecum. Vascular/Lymphatic: Diffuse vascular calcifications identified particularly of branch vessels. Normal caliber aorta and IVC. No specific abnormal lymph node enlargement identified in the abdomen and pelvis. Reproductive: Status post hysterectomy. No adnexal masses. Other: Small focal wide mouth hernia seen along the anterior pelvic wall in the right side lateral to the margin of the right rectus abdominis muscle containing a loop of small bowel. No obstruction. This is unchanged from previous. Small fat containing umbilical hernia. Musculoskeletal: Scattered degenerative changes seen of the spine and pelvis. IMPRESSION: New bilateral pleural effusions, right-greater-than-left with the adjacent parenchymal opacities. Infiltrates are possible and recommend follow-up. There is also a component of interstitial septal thickening which is new. A component of edema is possible. No bowel obstruction, free air or free fluid with scattered stool. No obstructing renal stones.  Mild bilateral renal atrophy. Dilated gallbladder with a dependent stone. No adjacent inflammatory changes by noncontrast CT. Please correlate with the prior ultrasound. If there is further concern, HIDA scan may be of some benefit. Significant  diffuse vascular calcifications. 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 Please correlate with any particular symptoms. Electronically Signed   By: Karen Kays M.D.   On: 05/05/2023 14:58   US Abdomen Limited RUQ (LIVER/GB)  Result Date: 05/05/2023 CLINICAL DATA:  Right upper quadrant pain for several weeks. EXAM: ULTRASOUND ABDOMEN LIMITED RIGHT UPPER QUADRANT COMPARISON:  04/14/2023 FINDINGS: Gallbladder: Gallbladder appears distended, similar to CT from 04/14/2023. Multiple stones are identified measuring up to 8 mm. No gallbladder wall thickening, pericholecystic fluid, or sludge. Negative sonographic Murphy's sign (note: patient received pain medication prior to ultrasound). Common bile duct: Diameter: 5.6 mm Liver: No focal lesion identified. Mild increased parenchymal echogenicity. Portal vein is patent on color Doppler imaging with normal direction of blood flow towards the liver. Other: None. IMPRESSION: 1. Gallstones. No gallbladder wall thickening, pericholecystic fluid, or sludge. Negative sonographic Murphy's sign (note: patient received pain medication prior to ultrasound). 2. Mild increased hepatic parenchymal echogenicity suggestive of steatosis. Electronically Signed   By: Signa Kell M.D.   On: 05/05/2023 11:56   DG Chest Port 1 View  Result Date: 05/05/2023 CLINICAL DATA:  Generalized chest pain, shortness of breath, and cough EXAM: PORTABLE CHEST 1 VIEW COMPARISON:  Chest radiograph dated 04/17/2023 FINDINGS: Low lung volumes with bronchovascular crowding. Bilateral perihilar interstitial opacities and bibasilar patchy opacities. Increased moderate bilateral pleural effusions. No pneumothorax. The heart size and mediastinal contours are within normal limits. No acute osseous abnormality. IMPRESSION: 1. Pulmonary edema with increased moderate bilateral pleural effusions. 2. Bibasilar patchy opacities, likely atelectasis. Aspiration or  pneumonia can be considered in the appropriate clinical setting. Electronically Signed   By: Agustin Cree M.D.   On: 05/05/2023 11:38   DG CHEST PORT  1 VIEW  Result Date: 04/17/2023 CLINICAL DATA:  Cough. EXAM: PORTABLE CHEST 1 VIEW COMPARISON:  Chest x-ray 04/16/2023 FINDINGS: The cardiac silhouette, mediastinal and hilar contours are within normal limits given the AP projection and portable technique. Stable tortuosity and calcification of the thoracic aorta. Peribronchial thickening and increased interstitial markings could suggest interstitial pulmonary edema or bronchitis. No pleural effusions or focal airspace consolidation. No pneumothorax. IMPRESSION: Peribronchial thickening and increased interstitial markings could suggest interstitial pulmonary edema or bronchitis. Electronically Signed   By: Rudie Meyer M.D.   On: 04/17/2023 10:32    Labs:  CBC: Recent Labs    05/11/23 0312 05/12/23 1435 05/14/23 0840 05/16/23 0325  WBC 7.4 9.3 8.0 9.0  HGB 10.8* 11.1* 10.6* 11.0*  HCT 31.0* 31.7* 30.5* 32.0*  PLT 353 381 321 319    COAGS: No results for input(s): "INR", "APTT" in the last 8760 hours.  BMP: Recent Labs    05/13/23 0356 05/14/23 0840 05/15/23 0337 05/16/23 0325  NA 127* 125* 130* 128*  K 3.8 3.4* 3.3* 3.2*  CL 88* 86* 92* 91*  CO2 24 25 26 23   GLUCOSE 131* 243* 135* 115*  BUN 38* 51* 30* 42*  CALCIUM 8.9 8.9 9.1 9.1  CREATININE 3.12* 3.65* 3.06* 3.75*  GFRNONAA 16* 13* 16* 12*    LIVER FUNCTION TESTS: Recent Labs    04/14/23 1715 05/05/23 0927 05/06/23 0518 05/07/23 0532 05/08/23 0636 05/13/23 0356 05/14/23 0840 05/15/23 0337 05/16/23 0325  BILITOT 0.7 0.8 0.5 0.7  --   --   --   --   --   AST 18 21 21 27   --   --   --   --   --   ALT 11 17 17 22   --   --   --   --   --   ALKPHOS 62 67 61 63  --   --   --   --   --   PROT 6.9 7.0 6.5 6.4*  --   --   --   --   --   ALBUMIN 3.4* 3.6 3.3* 3.1*   < > 3.3* 3.2* 3.3* 3.4*   < > = values in this interval  not displayed.    TUMOR MARKERS: No results for input(s): "AFPTM", "CEA", "CA199", "CHROMGRNA" in the last 8760 hours.  Assessment and Plan: 72 y.o. female with PMH sig for HLD,HTN, PAD, DM, CVA, anxiety/depression, CAD on plavix, diastolic dysfunction, CKD, pulm HTN, recently treated pyelonephritis/UTI/pulm edema presenting now with dyspnea/ oliguric AKI on CKD, started on HD 5/15 via nontunneled HD cath placed by CCM on 5/11. Latest creat 3.75(3.06). Request now received from nephrology for conversion of nontunneled HD cath to tunneled HD cath in anticipation of outpatient need for dialysis. Risks and benefits of image guided tunneled hemodialysis catheter placement was discussed with the patient/daughter including, but not limited to bleeding, infection, pneumothorax, and need for additional procedures.  All of the patient's questions were answered, patient is agreeable to proceed. Consent signed and in chart. Procedure tent scheduled for 5/20.     Thank you for this interesting consult.  I greatly enjoyed meeting Samantha [name] and look forward to participating in their care.  A copy of this report was sent to the requesting provider on this date.  Electronically Signed: D. Jeananne Rama, PA-C 05/16/2023, 11:46 AM   I spent a total of  20 minutes   in face to face in clinical consultation, greater than 50% of  which was counseling/coordinating care for tunneled hemodialysis catheter placement

## 2023-05-16 NOTE — Progress Notes (Signed)
[facility] Progress Note   Assessment/ Plan:   Samantha [name] is a/an 72 y.o. female with a past medical history significant for HTN, DM2, PAD, CVA, HLD, CKD 3B who presents with shortness of breath and concern for volume overload   The patient was recently hospitalized prior to this hospitalization for E. coli UTI and pyelonephritis.  She also was taking an ARB as well as NSAIDs at home.  She had an AKI and was given IV fluids.  She developed some volume overload and IV fluids were stopped.  Her creatinine was 2.9 at discharge.  When she read presented for this hospitalization her creatinine was 2.4.  She was taking Lasix 40 mg daily but had ran out a couple days before arriving.         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  Creatinine up to 2.9 at this time.   -Temporary catheter placed by CCM on 5/11, appreciate help - looks like she is improving with UOP-- continue Lasix 80 IV BID for now - adding fluid restriction -Continue to monitor daily Cr, Dose meds for GFR -Monitor Daily I/Os, Daily weight  -Maintain MAP>65 for optimal renal perfusion.  -Avoid nephrotoxic medications including NSAIDs -Use synthetic opioids (Fentanyl/Dilaudid) if needed -Serologies have been sent to workup for possible glomerular disease but seems unlikely given no significant hematuria.  Proteinuria is most likely related to her history of diabetes and recent tubular injury. - I anticipated her labs getting better- but they are not - HD #1 05/12/23 - HD #2 05/14/23 - didn't do so well on Lasix challenge - HD #3 5/20 - will do TDC placement too, appreciate IR  Acute hypoxic respiratory failure.  Most likely related to volume overload.  Appreciate help from pulmonary critical care team.  Seems to be improving after thoracentesis and diuresis.  Continue with diuresis as above   Volume overload: Mostly as above.  Of note, ejection fraction is  normal.  Echocardiogram did demonstrate that IVC was distended.  She seems to be improving with diuresis as well as thoracentesis.  Continue diuretics as above   Uncontrolled Diabetes Mellitus Type 2 with Hyperglycemia: Management per primary team   HTN: Medications per primary on carvedilol   Metabolic acidosis: Resolved.   Abdominal distention/fullness:  Improving with Bms and dialysis  Subjective:    Seen in room.  Bloated, nauseated, rate of rise increasing.  D/w pt and dtr- will need ongoing HD   Objective:   BP 130/78 (BP Location: Left Arm)   Pulse 75   Temp 97.6 F (36.4 C) (Oral)   Resp 17   Ht 5\' 5"  (1.651 m)   Wt 64.2 kg   SpO2 96%   BMI 23.55 kg/m  No intake or output data in the 24 hours ending 05/16/23 1259  Weight change: -1.6 kg  Physical Exam: Gen:NAD, off O2 CVS:RRR Resp: normal WOB Abd: soft, not as distended Ext: no LE edema ACCESS: R IJ nontunneled HD catheter  Imaging: No results found.  Labs: BMET Recent Labs  Lab 05/11/23 0312 05/12/23 0745 05/12/23 1435 05/13/23 0356 05/14/23 0840 05/15/23 0337 05/16/23 0325  NA 129* 126* 127* 127* 125* 130* 128*  K 3.5 3.3* 3.0* 3.8 3.4* 3.3* 3.2*  CL 86* 85* 79* 88* 86* 92* 91*  CO2 30 27 31 24 25 26 23   GLUCOSE 203* 244* 159* 131* 243* 135* 115*  BUN 51* 60* 65* 38* 51*  30* 42*  CREATININE 3.38* 3.67* 3.73* 3.12* 3.65* 3.06* 3.75*  CALCIUM 9.1 9.3 9.3 8.9 8.9 9.1 9.1  PHOS 5.7* 5.8* 6.4* 4.6 5.4* 4.8* 5.2*   CBC Recent Labs  Lab 05/11/23 0312 05/12/23 1435 05/14/23 0840 05/16/23 0325  WBC 7.4 9.3 8.0 9.0  HGB 10.8* 11.1* 10.6* 11.0*  HCT 31.0* 31.7* 30.5* 32.0*  MCV 90.1 88.5 89.7 91.4  PLT 353 381 321 319    Medications:     atorvastatin  80 mg Oral Daily   bisacodyl  10 mg Rectal Daily   carvedilol  6.25 mg Oral BID WC   Chlorhexidine Gluconate Cloth  6 each Topical Q0600   Chlorhexidine Gluconate Cloth  6 each Topical Q0600   clopidogrel  75 mg Oral Daily    diphenhydrAMINE  25 mg Intravenous Once   docusate sodium  100 mg Oral BID   fluticasone  2 spray Each Nare Daily   furosemide  80 mg Intravenous BID   guaiFENesin  600 mg Oral BID   heparin  5,000 Units Subcutaneous Q8H   insulin aspart  0-15 Units Subcutaneous TID WC   insulin aspart  0-5 Units Subcutaneous QHS   insulin aspart  3 Units Subcutaneous TID WC   insulin glargine-yfgn  15 Units Subcutaneous Daily   oxymetazoline  1 spray Each Nare Once   polyethylene glycol  17 g Oral BID   sodium chloride flush  3 mL Intravenous Q12H    Bufford Buttner MD 05/16/2023, 12:59 PM

## 2023-05-17 ENCOUNTER — Inpatient Hospital Stay (HOSPITAL_COMMUNITY): Payer: Medicaid Other

## 2023-05-17 ENCOUNTER — Ambulatory Visit: Payer: Self-pay | Admitting: Family Medicine

## 2023-05-17 HISTORY — PX: IR US GUIDE VASC ACCESS RIGHT: IMG2390

## 2023-05-17 HISTORY — PX: IR FLUORO GUIDE CV LINE RIGHT: IMG2283

## 2023-05-17 LAB — PROTIME-INR
INR: 1.1 (ref 0.8–1.2)
Prothrombin Time: 14.5 seconds (ref 11.4–15.2)

## 2023-05-17 LAB — RENAL FUNCTION PANEL
Albumin: 3.2 g/dL — ABNORMAL LOW (ref 3.5–5.0)
Anion gap: 14 (ref 5–15)
BUN: 53 mg/dL — ABNORMAL HIGH (ref 8–23)
CO2: 23 mmol/L (ref 22–32)
Calcium: 9.2 mg/dL (ref 8.9–10.3)
Chloride: 94 mmol/L — ABNORMAL LOW (ref 98–111)
Creatinine, Ser: 3.88 mg/dL — ABNORMAL HIGH (ref 0.44–1.00)
GFR, Estimated: 12 mL/min — ABNORMAL LOW (ref >60)
Glucose, Bld: 137 mg/dL — ABNORMAL HIGH (ref 70–99)
Phosphorus: 5.5 mg/dL — ABNORMAL HIGH (ref 2.5–4.6)
Potassium: 3.2 mmol/L — ABNORMAL LOW (ref 3.5–5.1)
Sodium: 131 mmol/L — ABNORMAL LOW (ref 135–145)

## 2023-05-17 LAB — GLUCOSE, CAPILLARY
Glucose-Capillary: 160 mg/dL — ABNORMAL HIGH (ref 70–99)
Glucose-Capillary: 143 mg/dL — ABNORMAL HIGH (ref 70–99)
Glucose-Capillary: 258 mg/dL — ABNORMAL HIGH (ref 70–99)

## 2023-05-17 MED ORDER — HEPARIN SODIUM (PORCINE) 1000 UNIT/ML IJ SOLN
INTRAMUSCULAR | Status: AC
  Filled 2023-05-17: qty 10

## 2023-05-17 MED ORDER — MIDAZOLAM HCL 2 MG/2ML IJ SOLN
INTRAMUSCULAR | Status: AC
  Filled 2023-05-17: qty 2

## 2023-05-17 MED ORDER — FENTANYL CITRATE (PF) 100 MCG/2ML IJ SOLN
INTRAMUSCULAR | Status: AC
  Filled 2023-05-17: qty 2

## 2023-05-17 MED ORDER — ONDANSETRON HCL 4 MG/2ML IJ SOLN
INTRAMUSCULAR | Status: AC
  Filled 2023-05-17: qty 2

## 2023-05-17 MED ORDER — CHLORHEXIDINE GLUCONATE 4 % EX SOLN
CUTANEOUS | Status: AC
  Filled 2023-05-17: qty 15

## 2023-05-17 MED ORDER — LIDOCAINE-EPINEPHRINE 1 %-1:100000 IJ SOLN
INTRAMUSCULAR | Status: AC
  Filled 2023-05-17: qty 1

## 2023-05-17 MED ORDER — CEFAZOLIN SODIUM-DEXTROSE 2-4 GM/100ML-% IV SOLN
INTRAVENOUS | Status: AC
  Filled 2023-05-17: qty 100

## 2023-05-17 MED ORDER — FENTANYL CITRATE (PF) 100 MCG/2ML IJ SOLN
INTRAMUSCULAR | Status: AC | PRN
  Administered 2023-05-17 (×3): 25 ug via INTRAVENOUS

## 2023-05-17 MED ORDER — MIDAZOLAM HCL 2 MG/2ML IJ SOLN
INTRAMUSCULAR | Status: AC | PRN
  Administered 2023-05-17: .5 mg via INTRAVENOUS
  Administered 2023-05-17: 1 mg via INTRAVENOUS

## 2023-05-17 NOTE — Progress Notes (Signed)
Contacted by nephrologist regarding pt's need for out-pt HD at d/c. Case discussed with nephrologist. Pt currently uninsured and will attempt out-pt clinic placement. Contacted pt's daughter, [name], via phone. Introduced self and explained role. Pt resides in [place] and family prefers an out-pt HD clinic in the [place] area if possible. Daughter confirms pt is uninsured but that a medicaid application is pending due to the assistance of hospital staff. Inquired of TOC staff type of medicaid being applied for so that info can be provided to [facility]. Daughter states that pt has been in the [place] for 10 yrs under a green card. Daughter states pt has a Solicitor # and daughter provided number to navigator for referral process. Referral submitted to [facility] admissions today for review. Pt's daughter also states that pt will need transportation to/from HD the days that daughter cannot transport due to work. Contacted TOC staff with daughter's request for assistance/options for transportation at d/c. Will assist as needed.   Olivia Canter Renal Navigator [phone]

## 2023-05-17 NOTE — Progress Notes (Signed)
   05/17/23 1215  Vitals  Temp 98.4 F (36.9 C)  Pulse Rate 98  Resp 16  BP 120/66  SpO2 100 %  O2 Device Room Air  Oxygen Therapy  Patient Activity (if Appropriate) In bed  Pulse Oximetry Type Continuous  Post Treatment  Dialyzer Clearance Clear  Duration of HD Treatment -hour(s) 3 hour(s)  Hemodialysis Intake (mL) 0 mL  Liters Processed 54  Fluid Removed (mL) 800 mL  Tolerated HD Treatment Yes  Post-Hemodialysis Comments Pt. tolerated Tx without  difficulties.   Received patient in bed to unit.  Alert and oriented.  Informed consent signed and in chart.   TX duration: 3  Patient tolerated well.  Transported back to the room  Alert, without acute distress.  Hand-off given to patient's nurse.   Access used: Yes Access issues: No   Total UF removed: 800 Medication(s) given: Heparin both ports 1.2cc Post HD VS: See Above Grid Post HD weight: 62.6kg   [facility]

## 2023-05-17 NOTE — Progress Notes (Addendum)
[facility] Progress Note   Assessment/ Plan:   Samantha [name] is a/an 72 y.o. female with a past medical history significant for HTN, DM2, PAD, CVA, HLD, CKD 3B who presents with shortness of breath and concern for volume overload   The patient was recently hospitalized prior to this hospitalization for E. coli UTI and pyelonephritis.  She also was taking an ARB as well as NSAIDs at home.  She had an AKI and was given IV fluids.  She developed some volume overload and IV fluids were stopped.  Her creatinine was 2.9 at discharge.  When she read presented for this hospitalization her creatinine was 2.4.  She was taking Lasix 40 mg daily but had ran out a couple days before arriving.       Oliguric AKI on CKD 3B which appears to have progressed to ESRD: Baseline was around 1.7 in 2022 but lately creatinine has been around 2.5-3 range. Unfortunately it appears that the patient has progressed to ESRD. Will also check a 24hr urine urea tues - wed to determine residual renal function to see if she would benefit from twice weekly hd to preserve residual function.  -Temporary catheter placed by CCM on 5/11, appreciate help - looks like she is improving with UOP-- continue Lasix 80 IV BID for now - adding fluid restriction -Continue to monitor daily Cr, Dose meds for GFR -Monitor Daily I/Os, Daily weight  -Maintain MAP>65 for optimal renal perfusion.  -Avoid nephrotoxic medications including NSAIDs -Use synthetic opioids (Fentanyl/Dilaudid) if needed -Serologies have been sent to workup for possible glomerular disease but seems unlikely given no significant hematuria.  Proteinuria is most likely related to her history of diabetes and recent tubular injury. - I anticipated her labs getting better- but they are not. Continues to trend in the wrong direction, oliguric as well. - HD #1 05/12/23 - HD #2 05/14/23 - didn't do so well on Lasix challenge - HD #3 5/20 - will do TDC placement  too, appreciate IR  - will start CLIP for ESRD as it appears she has progressed. Cr was 1.7 in 2022 but has bee in the 2.4-3 range recently and no signs of renal recovery.   Acute hypoxic respiratory failure.  Most likely related to volume overload.  Appreciate help from pulmonary critical care team.  Seems to be improving after thoracentesis and diuresis.  Continue with diuresis as above   Volume overload: Mostly as above.  Of note, ejection fraction is normal.  Echocardiogram did demonstrate that IVC was distended.  She seems to be improving with diuresis as well as thoracentesis.  Continue diuretics as above   Uncontrolled Diabetes Mellitus Type 2 with Hyperglycemia: Management per primary team   HTN: Medications per primary on carvedilol   Metabolic acidosis: Resolved.   Abdominal distention/fullness:  Improving with Bms and dialysis  Subjective:    Seen in room.  Bloated, denies nausea this AM. D/w pt and plan for ongoing HD (next today)   Objective:   BP (!) 130/55 (BP Location: Left Arm)   Pulse 68   Temp 98.5 F (36.9 C) (Oral)   Resp 13   Ht 5\' 5"  (1.651 m)   Wt 64.4 kg   SpO2 97%   BMI 23.63 kg/m   Intake/Output Summary (Last 24 hours) at 05/17/2023 0739 Last data filed at 05/16/2023 1200 Gross per 24 hour  Intake 240 ml  Output 300 ml  Net -60 ml    Weight change: 0.2 kg  Physical Exam: Gen:NAD, off O2 CVS:RRR Resp: normal WOB Abd: soft, not as distended Ext: no LE edema ACCESS: R IJ nontunneled HD catheter  Imaging: No results found.  Labs: BMET Recent Labs  Lab 05/12/23 0745 05/12/23 1435 05/13/23 0356 05/14/23 0840 05/15/23 0337 05/16/23 0325 05/17/23 0433  NA 126* 127* 127* 125* 130* 128* 131*  K 3.3* 3.0* 3.8 3.4* 3.3* 3.2* 3.2*  CL 85* 79* 88* 86* 92* 91* 94*  CO2 27 31 24 25 26 23 23   GLUCOSE 244* 159* 131* 243* 135* 115* 137*  BUN 60* 65* 38* 51* 30* 42* 53*  CREATININE 3.67* 3.73* 3.12* 3.65* 3.06* 3.75* 3.88*  CALCIUM 9.3 9.3  8.9 8.9 9.1 9.1 9.2  PHOS 5.8* 6.4* 4.6 5.4* 4.8* 5.2* 5.5*   CBC Recent Labs  Lab 05/11/23 0312 05/12/23 1435 05/14/23 0840 05/16/23 0325  WBC 7.4 9.3 8.0 9.0  HGB 10.8* 11.1* 10.6* 11.0*  HCT 31.0* 31.7* 30.5* 32.0*  MCV 90.1 88.5 89.7 91.4  PLT 353 381 321 319    Medications:     atorvastatin  80 mg Oral Daily   bisacodyl  10 mg Rectal Daily   carvedilol  6.25 mg Oral BID WC   Chlorhexidine Gluconate Cloth  6 each Topical Q0600   Chlorhexidine Gluconate Cloth  6 each Topical Q0600   clopidogrel  75 mg Oral Daily   diphenhydrAMINE  25 mg Intravenous Once   docusate sodium  100 mg Oral BID   fluticasone  2 spray Each Nare Daily   furosemide  80 mg Intravenous BID   guaiFENesin  600 mg Oral BID   heparin  5,000 Units Subcutaneous Q8H   insulin aspart  0-15 Units Subcutaneous TID WC   insulin aspart  0-5 Units Subcutaneous QHS   insulin aspart  3 Units Subcutaneous TID WC   insulin glargine-yfgn  15 Units Subcutaneous Daily   oxymetazoline  1 spray Each Nare Once   polyethylene glycol  17 g Oral BID   sodium chloride flush  3 mL Intravenous Q12H

## 2023-05-17 NOTE — Procedures (Signed)
Interventional Radiology Procedure:   Indications: ESRD  Procedure: Placement of tunneled dialysis catheter  Findings: Old right jugular temp cath was removed.  New 19 cm Palindrome catheter placed via right internal jugular vein.  Tip in right atrium.    Complications: None     EBL: Minimal  Plan: Dialysis catheter is ready for use.   Harman Langhans R. Lowella Dandy, MD  Pager: [phone]

## 2023-05-17 NOTE — Progress Notes (Signed)
PROGRESS NOTE    Samantha [name]  ZOX:[id number] DOB: 26-Nov-1951 DOA: 05/05/2023 PCP: Hoy Register, MD    Brief Narrative:   Patient is a 72 year old female with past medical history of PAD, hypertension, type 2 diabetes, history of CVA, depression and anxiety was recently discharged from the hospital on 04/18/23 after treatment for UTI/pyelonephritis, pulmonary edema and was given prescription for Lasix for 7 days.  This time again presented with worsening abdominal discomfort chest discomfort and chills. CT chest abdomen/pelvis was done which showed pulmonary edema versus bibasilar infiltrates.  BNP was elevated to 600.  Patient had initial persistent pulmonary edema, acute hypoxic respiratory failure requiring 15 L of supplemental oxygen, was transferred to the ICU.  During hospital stay, patient underwent bilateral thoracocentesis with removal of1 L of pleural fluid from the left and 1.5 L from the right.  2D echocardiogram showed diastolic dysfunction and moderate pulmonary hypertension.  Patient was also seen by nephrology during hospitalization,  was put on high-dose IV Lasix fortunately she remains with low urinary output, was started on HD 05/12/2023.  Assessment and plan.  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 -Admit for HD, she received dialysis twice 5/15, and 5/17, plan to observe over the weekend without HD to make determination of more HD is needed, on Lasix challenge currently, monitor urine output closely and creatinine trend.  Renal input appreciated, she did not do well with Lasix challenge, she was dialyzed this morning, plan for [facility] this afternoon, IR assistance appreciated.   Acute hypoxemic respiratory failure Secondary pulmonary edema, bilateral pleural effusion, BNP elevated 601.  procalcitonin  less than 0.10.  Continue IV Lasix high-dose..  Was initially on 15 L of oxygen but has been weaned down to room air. Status post bilateral thoracocentesis.  Patient had hemodialysis catheter placed in by nephrology.  Patient has received IV Lasix high-dose which has been decreased to 80 milligram twice daily at this time.  Pleural fluid analysis transudative in nature.  Mild hypokalemia.  Improved after replacement.    Mild hyponatremia will continue to monitor closely.  Nephrology on board.   Pulmonary edema/acute on chronic diastolic heart failure: Nephrology on board.  Has been diuresing. Review of recent echocardiogram revealed EF 55 to 60%, mild LVH, left ventricular diastolic parameters indeterminate and RVSP of 46.4 mmHg.  Negative balance for 8545 ml.  On IV Lasix 80 mg twice daily.  Nephrology following.   Concern for pneumonia: -Chest x-ray showed bibasilar patchy opacities, concern for pneumonia. Received 1 dose of Rocephin and Zithromax in the ED.  Procalcitonin less than 0.1.  Antibiotics discontinued.  Blood cultures negative in 5 days.   Abdominal pain, abdominal fullness secondary to constipation.  Continue bowel regimen.  History of diabetes.  Will continue on aggressive bowel regimen.  Patient needed enema and manual disimpaction initially.  Will continue with MiraLAX twice daily and add Dulcolax suppository daily.  Urinalysis 5 days back without any evidence of infection.  Diabetes mellitus type 2: Continue sliding scale insulin Accu-Cheks.  Glimepiride on hold   History of CAD: -Continue Plavix   Hypertension: Blood pressure on the lower side, continue to hold amlodipine, Coreg dose has been decreased from 25 mg p.o. twice daily to 6.25 mg p.o. twice daily.    Possible restless leg syndrome -Patient report symptoms of lower extremity cramps preceded dialysis,  has been going on for few month, will monitor over the weekend if still persistent despite being off dialysis  will consider starting Requip     DVT prophylaxis: heparin injection 5,000 Units Start: 05/09/23 0600   Code Status:     Code Status: Full Code  Disposition: Plan to DC home, once she is clipped and HD access is established.  Status is: Inpatient Remains inpatient appropriate because: Volume overload, IV Lasix, electrolyte imbalance,  None at bedside today  Consultants:  Nephrology.  Procedures:  Temp HD catheter   Antimicrobials:  None  Anti-infectives (From admission, onward)    Start     Dose/Rate Route Frequency Ordered Stop   05/17/23 1500  ceFAZolin (ANCEF) IVPB 2g/100 mL premix        2 g 200 mL/hr over 30 Minutes Intravenous To Radiology 05/16/23 1204 05/18/23 1500   05/08/23 1000  ceFAZolin (ANCEF) IVPB 2g/100 mL premix        2 g 200 mL/hr over 30 Minutes Intravenous To Radiology 05/07/23 1537 05/09/23 1000   05/05/23 1515  cefTRIAXone (ROCEPHIN) 1 g in sodium chloride 0.9 % 100 mL IVPB        1 g 200 mL/hr over 30 Minutes Intravenous  Once 05/05/23 1510 05/05/23 1642   05/05/23 1515  azithromycin (ZITHROMAX) 500 mg in sodium chloride 0.9 % 250 mL IVPB        500 mg 250 mL/hr over 60 Minutes Intravenous  Once 05/05/23 1510 05/05/23 1750       Subjective:  Patient reports she is hungry, asking to eat, I have told her her PD cath should be performed soon and it should be performed after that.   Vitals:   05/17/23 1100 05/17/23 1130 05/17/23 1209 05/17/23 1215  BP: 120/67 125/75  120/66  Pulse: 74 78  98  Resp: 16 15  16   Temp:    98.4 F (36.9 C)  TempSrc:      SpO2: 99% 100%  100%  Weight:   62.6 kg   Height:        Intake/Output Summary (Last 24 hours) at 05/17/2023 1307 Last data filed at 05/17/2023 1215 Gross per 24 hour  Intake --  Output 800 ml  Net -800 ml    Filed Weights   05/17/23 0300 05/17/23 0839 05/17/23 1209  Weight: 64.4 kg 62.6 kg 62.6 kg    Physical Examination: Body mass index is 22.97 kg/m.   Awake Alert,  Oriented X 3, frail, chronically ill-appearing Symmetrical Chest wall movement, Good air movement bilaterally, CTAB RRR,No Gallops,Rubs or new Murmurs, No Parasternal Heave +ve B.Sounds, Abd Soft, No tenderness, No rebound - guarding or rigidity. No Cyanosis, Clubbing or edema, No new Rash or bruise          Data Reviewed:   CBC: Recent Labs  Lab 05/11/23 0312 05/12/23 1435 05/14/23 0840 05/16/23 0325  WBC 7.4 9.3 8.0 9.0  HGB 10.8* 11.1* 10.6* 11.0*  HCT 31.0* 31.7* 30.5* 32.0*  MCV 90.1 88.5 89.7 91.4  PLT 353 381 321 319    Basic Metabolic Panel: Recent Labs  Lab 05/11/23 0312 05/12/23 0745 05/13/23 0356 05/14/23 0840 05/15/23 0337 05/16/23 0325 05/17/23 0433  NA 129*   < > 127* 125* 130* 128* 131*  K 3.5   < > 3.8 3.4* 3.3* 3.2* 3.2*  CL 86*   < > 88* 86* 92* 91* 94*  CO2 30   < > 24 25 26 23 23   GLUCOSE 203*   < >  131* 243* 135* 115* 137*  BUN 51*   < > 38* 51* 30* 42* 53*  CREATININE 3.38*   < > 3.12* 3.65* 3.06* 3.75* 3.88*  CALCIUM 9.1   < > 8.9 8.9 9.1 9.1 9.2  MG 2.2  --  2.0  --   --   --   --   PHOS 5.7*   < > 4.6 5.4* 4.8* 5.2* 5.5*   < > = values in this interval not displayed.    Liver Function Tests: Recent Labs  Lab 05/13/23 0356 05/14/23 0840 05/15/23 0337 05/16/23 0325 05/17/23 0433  ALBUMIN 3.3* 3.2* 3.3* 3.4* 3.2*     Radiology Studies: No results found.    LOS: 12 days    Huey Bienenstock, MD [facility] Available via Epic secure chat 7am-7pm After these hours, please refer to coverage provider listed on [email or web address] 05/17/2023, 1:07 PM

## 2023-05-17 NOTE — Progress Notes (Signed)
Copy of inpt HD order. Copy needed for out-pt clinic referral.     Hemodialysis inpatient (Order [id number]) Dialysis Date: 05/16/2023 Department: [facility] Ordering/Authorizing: Bufford Buttner, MD     Bufford Buttner, MD NPI: [id number]    Patient Information  Patient Name Samantha [name], Samantha [name] Legal Sex Female DOB 1951-05-17 SSN [id number]   Order Information  Order Date/Time Release Date/Time Start Date/Time End Date/Time  05/16/23 11:01 AM None 05/17/23 12:00 AM 05/17/23 12:00 AM   Order History Inpatient Date/Time Action Taken User Additional Information  05/16/23 1101 Sign Bufford Buttner, MD Reorder from [id number]  05/16/23 1101 Release Instance Bufford Buttner, MD (auto-released) Released [id number]  05/16/23 1111 Acknowledge Desmond Lope, RN New Order   Order Questions  Question Answer  Mode HD  K+ 4 meq  Ca++ 2.25 meq  Na+ 138 meq  HCO3- (Bicarb) 39  Dialyzer Revaclear 300  Dialysate Temperature (C) 37  BFR-As tolerated to a maximum of: 300 mL/min  DFR 300 ml/min  Duration of Treatment 3 Hours  Dry weight (kg) or fluid removal (L) As Tolerated 1.5L as tolerated  Access Site Dialysis Catheter  HD Standing Orders Yes        Comments  3nd HD treatment Heparin 2000 u bolus After [facility] placement         Reference Links         Standing Order Information  Remaining Occurrences Interval Last Released    0/1 Tomorrow 05/16/2023             Hemodialysis inpatient: Patient Communication   Not Released  Not seen    Collection Information         Encounter  View Encounter            Tracking Links  Cosign Tracking Order Transmittal Tracking

## 2023-05-18 LAB — GLUCOSE, CAPILLARY
Glucose-Capillary: 133 mg/dL — ABNORMAL HIGH (ref 70–99)
Glucose-Capillary: 124 mg/dL — ABNORMAL HIGH (ref 70–99)
Glucose-Capillary: 234 mg/dL — ABNORMAL HIGH (ref 70–99)
Glucose-Capillary: 171 mg/dL — ABNORMAL HIGH (ref 70–99)

## 2023-05-18 LAB — RENAL FUNCTION PANEL
Albumin: 3.4 g/dL — ABNORMAL LOW (ref 3.5–5.0)
Anion gap: 14 (ref 5–15)
BUN: 28 mg/dL — ABNORMAL HIGH (ref 8–23)
CO2: 24 mmol/L (ref 22–32)
Calcium: 9.6 mg/dL (ref 8.9–10.3)
Chloride: 94 mmol/L — ABNORMAL LOW (ref 98–111)
Creatinine, Ser: 3.69 mg/dL — ABNORMAL HIGH (ref 0.44–1.00)
GFR, Estimated: 13 mL/min — ABNORMAL LOW (ref >60)
Glucose, Bld: 134 mg/dL — ABNORMAL HIGH (ref 70–99)
Phosphorus: 5.3 mg/dL — ABNORMAL HIGH (ref 2.5–4.6)
Potassium: 3.7 mmol/L (ref 3.5–5.1)
Sodium: 132 mmol/L — ABNORMAL LOW (ref 135–145)

## 2023-05-18 LAB — HEPATITIS B SURFACE ANTIGEN

## 2023-05-18 MED ORDER — MECLIZINE HCL 25 MG PO TABS
25.0000 mg | ORAL_TABLET | Freq: Once | ORAL | Status: AC
  Administered 2023-05-18: 25 mg via ORAL
  Filled 2023-05-18: qty 1

## 2023-05-18 NOTE — Progress Notes (Signed)
[facility] Progress Note   Assessment/ Plan:   Samantha [name] is a/an 72 y.o. female with a past medical history significant for HTN, DM2, PAD, CVA, HLD, CKD 3B who presents with shortness of breath and concern for volume overload   The patient was recently hospitalized prior to this hospitalization for E. coli UTI and pyelonephritis.  She also was taking an ARB as well as NSAIDs at home.  She had an AKI and was given IV fluids.  She developed some volume overload and IV fluids were stopped.  Her creatinine was 2.9 at discharge.  When she read presented for this hospitalization her creatinine was 2.4.  She was taking Lasix 40 mg daily but had ran out a couple days before arriving.       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 Unfortunately it appears that the patient has progressed to ESRD. Will also check a 24hr urine urea tues - wed to determine residual renal function to see if she would benefit from twice weekly hd to preserve residual function.  -Temporary catheter placed by CCM on 5/11, appreciate help - looks like she is improving with UOP-- continue Lasix 80 IV BID for now - adding fluid restriction -Continue to monitor daily Cr, Dose meds for GFR -Monitor Daily I/Os, Daily weight  -Maintain MAP>65 for optimal renal perfusion.  -Avoid nephrotoxic medications including NSAIDs -Use synthetic opioids (Fentanyl/Dilaudid) if needed -Serologies have been sent to workup for possible glomerular disease but seems unlikely given no significant hematuria.  Proteinuria is most likely related to her history of diabetes and recent tubular injury. - I anticipated her labs getting better- but they are not. Continued to trend in the wrong direction, oliguric as well leading to HD. - HD #1 05/12/23 - HD #2 05/14/23 - didn't do so well on Lasix challenge - HD #3 5/20 (2L) ->  plan #4 on Wed 2nd shift. Need to 24hr study (will speak w/ nurse)  - Appreciate IR exchanging for a RIJ 19cm CTT TC on 5/20 - Appreciate renal navigator working on placement. Cr was 1.7 in 2022 but had been in the 2.4-3 range recently and no signs of renal recovery.    2) Acute hypoxic respiratory failure.  Most likely related to volume overload.  Appreciate help from pulmonary critical care team.  Seems to be improving after thoracentesis and diuresis.  Continue with diuresis as above   3) Volume overload: Mostly as above.  Of note, ejection fraction is normal.  Echocardiogram did demonstrate that IVC was distended.  She seems to be improving with diuresis as well as thoracentesis.  Continue diuretics as above   4) Uncontrolled Diabetes Mellitus Type 2 with Hyperglycemia: Management per primary team   5) HTN: Medications per primary on carvedilol   6) Metabolic acidosis: Resolved.   7) Abdominal distention/fullness:  Improving with Bms and dialysis  Subjective:    Seen in room.  Feels better today. D/w pt and tolerated HD Mon. Had daughter Debarah Crape translate and explain to patient.    Objective:   BP 133/68 (BP Location: Left Arm)   Pulse 75   Temp 98.5 F (36.9 C) (Axillary)   Resp 14   Ht 5\' 5"  (1.651 m)   Wt 62.6 kg   SpO2 95%   BMI 22.97 kg/m   Intake/Output Summary (Last 24 hours) at 05/18/2023 0703 Last  data filed at 05/17/2023 1537 Gross per 24 hour  Intake 100 ml  Output 800 ml  Net -700 ml    Weight change: -1.8 kg  Physical Exam: Gen:NAD, off O2 CVS:RRR Resp: normal WOB Abd: soft, not as distended Ext: no LE edema ACCESS: R IJ tunneled HD catheter  Imaging: IR Fluoro Guide CV Line Right  Result Date: 05/17/2023 INDICATION: End-stage renal disease.  Request for a tunneled dialysis catheter. EXAM: FLUOROSCOPIC AND ULTRASOUND GUIDED PLACEMENT OF A TUNNELED DIALYSIS CATHETER Physician: Rachelle Hora. Lowella Dandy, MD MEDICATIONS: Ancef 2 g; The antibiotic was administered  within an appropriate time interval prior to skin puncture. ANESTHESIA/SEDATION: Moderate (conscious) sedation was employed during this procedure. A total of Versed 1mg  and fentanyl 75 mcg was administered intravenously at the order of the provider performing the procedure. Total intra-service moderate sedation time: 26 minutes. Patient's level of consciousness and vital signs were monitored continuously by radiology nurse throughout the procedure under the supervision of the provider performing the procedure. FLUOROSCOPY TIME:  Radiation Exposure Index (as provided by the fluoroscopic device): 2 mGy Kerma COMPLICATIONS: None immediate. PROCEDURE: Informed consent was obtained for placement of a tunneled dialysis catheter. The patient was placed supine on the interventional table. Ultrasound confirmed a patent right internal jugular vein. Ultrasound image obtained for documentation. The right neck and chest was prepped and draped in a sterile fashion. Maximal barrier sterile technique was utilized including caps, mask, sterile gowns, sterile gloves, sterile drape, hand hygiene and skin antiseptic. The existing right jugular catheter was removed with manual compression. The right neck was anesthetized with 1% lidocaine. A small incision was made with #11 blade scalpel. A 21 gauge needle directed into the right internal jugular vein with ultrasound guidance. A micropuncture dilator set was placed. A 19 cm tip to cuff Palindrome catheter was selected. The skin below the right clavicle was anesthetized and a small incision was made with an #11 blade scalpel. A subcutaneous tunnel was formed to the vein dermatotomy site. The catheter was brought through the tunnel. The vein dermatotomy site was dilated to accommodate a peel-away sheath. The catheter was placed through the peel-away sheath and directed into the central venous structures. The tip of the catheter was placed in the right atrium with fluoroscopy. Fluoroscopic  images were obtained for documentation. Both lumens were found to aspirate and flush well. The proper amount of heparin was flushed in both lumens. The vein dermatotomy site was closed using a single layer of absorbable suture and Dermabond. The catheter was secured to the skin using Prolene suture. FINDINGS: Old right jugular catheter was removed. New jugular catheter tip in the right atrium. IMPRESSION: Successful placement of a right jugular tunneled dialysis catheter using ultrasound and fluoroscopic guidance. Electronically Signed   By: Richarda Overlie M.D.   On: 05/17/2023 17:48   IR US Guide Vasc Access Right  Result Date: 05/17/2023 INDICATION: End-stage renal disease.  Request for a tunneled dialysis catheter. EXAM: FLUOROSCOPIC AND ULTRASOUND GUIDED PLACEMENT OF A TUNNELED DIALYSIS CATHETER Physician: Rachelle Hora. Lowella Dandy, MD MEDICATIONS: Ancef 2 g; The antibiotic was administered within an appropriate time interval prior to skin puncture. ANESTHESIA/SEDATION: Moderate (conscious) sedation was employed during this procedure. A total of Versed 1mg  and fentanyl 75 mcg was administered intravenously at the order of the provider performing the procedure. Total intra-service moderate sedation time: 26 minutes. Patient's level of consciousness and vital signs were monitored continuously by radiology nurse throughout the procedure under the supervision of the provider performing  the procedure. FLUOROSCOPY TIME:  Radiation Exposure Index (as provided by the fluoroscopic device): 2 mGy Kerma COMPLICATIONS: None immediate. PROCEDURE: Informed consent was obtained for placement of a tunneled dialysis catheter. The patient was placed supine on the interventional table. Ultrasound confirmed a patent right internal jugular vein. Ultrasound image obtained for documentation. The right neck and chest was prepped and draped in a sterile fashion. Maximal barrier sterile technique was utilized including caps, mask, sterile gowns,  sterile gloves, sterile drape, hand hygiene and skin antiseptic. The existing right jugular catheter was removed with manual compression. The right neck was anesthetized with 1% lidocaine. A small incision was made with #11 blade scalpel. A 21 gauge needle directed into the right internal jugular vein with ultrasound guidance. A micropuncture dilator set was placed. A 19 cm tip to cuff Palindrome catheter was selected. The skin below the right clavicle was anesthetized and a small incision was made with an #11 blade scalpel. A subcutaneous tunnel was formed to the vein dermatotomy site. The catheter was brought through the tunnel. The vein dermatotomy site was dilated to accommodate a peel-away sheath. The catheter was placed through the peel-away sheath and directed into the central venous structures. The tip of the catheter was placed in the right atrium with fluoroscopy. Fluoroscopic images were obtained for documentation. Both lumens were found to aspirate and flush well. The proper amount of heparin was flushed in both lumens. The vein dermatotomy site was closed using a single layer of absorbable suture and Dermabond. The catheter was secured to the skin using Prolene suture. FINDINGS: Old right jugular catheter was removed. New jugular catheter tip in the right atrium. IMPRESSION: Successful placement of a right jugular tunneled dialysis catheter using ultrasound and fluoroscopic guidance. Electronically Signed   By: Richarda Overlie M.D.   On: 05/17/2023 17:48    Labs: BMET Recent Labs  Lab 05/12/23 1435 05/13/23 0356 05/14/23 0840 05/15/23 0337 05/16/23 0325 05/17/23 0433 05/18/23 0535  NA 127* 127* 125* 130* 128* 131* 132*  K 3.0* 3.8 3.4* 3.3* 3.2* 3.2* 3.7  CL 79* 88* 86* 92* 91* 94* 94*  CO2 31 24 25 26 23 23 24   GLUCOSE 159* 131* 243* 135* 115* 137* 134*  BUN 65* 38* 51* 30* 42* 53* 28*  CREATININE 3.73* 3.12* 3.65* 3.06* 3.75* 3.88* 3.69*  CALCIUM 9.3 8.9 8.9 9.1 9.1 9.2 9.6  PHOS 6.4*  4.6 5.4* 4.8* 5.2* 5.5* 5.3*   CBC Recent Labs  Lab 05/12/23 1435 05/14/23 0840 05/16/23 0325  WBC 9.3 8.0 9.0  HGB 11.1* 10.6* 11.0*  HCT 31.7* 30.5* 32.0*  MCV 88.5 89.7 91.4  PLT 381 321 319    Medications:     atorvastatin  80 mg Oral Daily   bisacodyl  10 mg Rectal Daily   carvedilol  6.25 mg Oral BID WC   Chlorhexidine Gluconate Cloth  6 each Topical Q0600   Chlorhexidine Gluconate Cloth  6 each Topical Q0600   clopidogrel  75 mg Oral Daily   diphenhydrAMINE  25 mg Intravenous Once   docusate sodium  100 mg Oral BID   fluticasone  2 spray Each Nare Daily   furosemide  80 mg Intravenous BID   guaiFENesin  600 mg Oral BID   heparin  5,000 Units Subcutaneous Q8H   insulin aspart  0-15 Units Subcutaneous TID WC   insulin aspart  0-5 Units Subcutaneous QHS   insulin aspart  3 Units Subcutaneous TID WC   insulin glargine-yfgn  15 Units Subcutaneous Daily   oxymetazoline  1 spray Each Nare Once   sodium chloride flush  3 mL Intravenous Q12H

## 2023-05-18 NOTE — Progress Notes (Signed)
PROGRESS NOTE    Samantha [name]  ZOX:[id number] DOB: 1951/05/02 DOA: 05/05/2023 PCP: Hoy Register, MD    Brief Narrative:   Patient is a 72 year old female with past medical history of PAD, hypertension, type 2 diabetes, history of CVA, depression and anxiety was recently discharged from the hospital on 04/18/23 after treatment for UTI/pyelonephritis, pulmonary edema and was given prescription for Lasix for 7 days.  This time again presented with worsening abdominal discomfort chest discomfort and chills. CT chest abdomen/pelvis was done which showed pulmonary edema versus bibasilar infiltrates.  BNP was elevated to 600.  Patient had initial persistent pulmonary edema, acute hypoxic respiratory failure requiring 15 L of supplemental oxygen, was transferred to the ICU.  During hospital stay, patient underwent bilateral thoracocentesis with removal of1 L of pleural fluid from the left and 1.5 L from the right.  2D echocardiogram showed diastolic dysfunction and moderate pulmonary hypertension.  Patient was also seen by nephrology during hospitalization,  was put on high-dose IV Lasix fortunately she remains with low urinary output, was started on HD 05/12/2023.  DC inserted 5/20.  Assessment and plan.  Oliguric AKI on CKD stage IIIb -Baseline creatinine 1.7, peaked at 3.7-with minimal urine output,  - unfortunately creatinine trending up, urine output is decreasing, and sodium is down. -Admitted  for HD, she received dialysis twice 5/15, and 5/17, observed over the weekend without HD to make determination of more HD is needed,  Renal input appreciated, she did not do well with Lasix challenge. -TDC inserted 5/20, and   Acute hypoxemic respiratory failure Secondary pulmonary edema, bilateral pleural effusion, BNP elevated 601.  procalcitonin less than 0.10.  Continue IV Lasix high-dose..  Was initially on 15 L of oxygen but has been weaned down to room air. Status post bilateral  thoracocentesis.  Patient had hemodialysis catheter placed in by nephrology.  - Pleural fluid analysis transudative in nature. -She is currently on room air  Mild hypokalemia.  Improved after replacement.    Mild hyponatremia will continue to monitor closely.  Nephrology on board.   Pulmonary edema/acute on chronic diastolic heart failure: Nephrology on board.  Has been diuresing. Review of recent echocardiogram revealed EF 55 to 60%, mild LVH, left ventricular diastolic parameters indeterminate and RVSP of 46.4 mmHg. = -Volume managed with HD.   Concern for pneumonia: -Chest x-ray showed bibasilar patchy opacities, concern for pneumonia. Received 1 dose of Rocephin and Zithromax in the ED.  Procalcitonin less than 0.1.  Antibiotics discontinued.  Blood cultures negative in 5 days.   Abdominal pain, abdominal fullness secondary to constipation.  Continue bowel regimen.  History of diabetes.  Will continue on aggressive bowel regimen.  Patient needed enema and manual disimpaction initially.  Will continue with MiraLAX twice daily and add Dulcolax suppository daily.  Urinalysis 5 days back without any evidence of infection.  Diabetes mellitus type 2: Continue sliding scale insulin Accu-Cheks.  Glimepiride on hold   History of CAD: -Continue Plavix   Hypertension: Blood pressure on the lower side, continue to hold amlodipine, Coreg dose has been decreased from 25 mg p.o. twice daily to 6.25 mg p.o. twice daily.    Possible restless leg syndrome -Patient report symptoms of lower extremity cramps preceded dialysis, has been going on for few month, will monitor over the weekend if still persistent despite being off dialysis will consider starting Requip     DVT prophylaxis: heparin injection 5,000 Units Start: 05/09/23 0600   Code Status:     Code Status:  Full Code  Disposition: Plan to DC home, once she is clipped and HD access is established.  Status is: Inpatient  None at  bedside today  Consultants:  Nephrology.  Procedures:  Temp HD catheter  [facility] 05/17/2023  Antimicrobials:  None  Anti-infectives (From admission, onward)    Start     Dose/Rate Route Frequency Ordered Stop   05/17/23 1500  ceFAZolin (ANCEF) IVPB 2g/100 mL premix        2 g 200 mL/hr over 30 Minutes Intravenous To Radiology 05/16/23 1204 05/17/23 1537   05/08/23 1000  ceFAZolin (ANCEF) IVPB 2g/100 mL premix        2 g 200 mL/hr over 30 Minutes Intravenous To Radiology 05/07/23 1537 05/09/23 1000   05/05/23 1515  cefTRIAXone (ROCEPHIN) 1 g in sodium chloride 0.9 % 100 mL IVPB        1 g 200 mL/hr over 30 Minutes Intravenous  Once 05/05/23 1510 05/05/23 1642   05/05/23 1515  azithromycin (ZITHROMAX) 500 mg in sodium chloride 0.9 % 250 mL IVPB        500 mg 250 mL/hr over 60 Minutes Intravenous  Once 05/05/23 1510 05/05/23 1750       Subjective:  No significant events overnight, she reports generalized weakness, she reports appetite and nausea has improved   Vitals:   05/17/23 2332 05/18/23 0437 05/18/23 0439 05/18/23 0814  BP: 138/76 133/68  119/71  Pulse: 71 75    Resp: 15 14  19   Temp: 97.8 F (36.6 C) 98.5 F (36.9 C)  98 F (36.7 C)  TempSrc: Oral Axillary  Oral  SpO2:    96%  Weight:   62.6 kg   Height:        Intake/Output Summary (Last 24 hours) at 05/18/2023 1424 Last data filed at 05/17/2023 1537 Gross per 24 hour  Intake 100 ml  Output --  Net 100 ml    Filed Weights   05/17/23 0839 05/17/23 1209 05/18/23 0439  Weight: 62.6 kg 62.6 kg 62.6 kg    Physical Examination: Body mass index is 22.97 kg/m.   Awake Alert, Oriented X 3, No new F.N deficits, Normal affect Symmetrical Chest wall movement, Good air movement bilaterally, CTAB RRR,No Gallops,Rubs or new Murmurs, No Parasternal Heave +ve B.Sounds, Abd Soft, No tenderness, No rebound - guarding or rigidity. No Cyanosis, Clubbing or edema, No new Rash or bruise         Data Reviewed:    CBC: Recent Labs  Lab 05/12/23 1435 05/14/23 0840 05/16/23 0325  WBC 9.3 8.0 9.0  HGB 11.1* 10.6* 11.0*  HCT 31.7* 30.5* 32.0*  MCV 88.5 89.7 91.4  PLT 381 321 319    Basic Metabolic Panel: Recent Labs  Lab 05/13/23 0356 05/14/23 0840 05/15/23 0337 05/16/23 0325 05/17/23 0433 05/18/23 0535  NA 127* 125* 130* 128* 131* 132*  K 3.8 3.4* 3.3* 3.2* 3.2* 3.7  CL 88* 86* 92* 91* 94* 94*  CO2 24 25 26 23 23 24   GLUCOSE 131* 243* 135* 115* 137* 134*  BUN 38* 51* 30* 42* 53* 28*  CREATININE 3.12* 3.65* 3.06* 3.75* 3.88* 3.69*  CALCIUM 8.9 8.9 9.1 9.1 9.2 9.6  MG 2.0  --   --   --   --   --   PHOS 4.6 5.4* 4.8* 5.2* 5.5* 5.3*    Liver Function Tests: Recent Labs  Lab 05/14/23 0840 05/15/23 0337 05/16/23 0325 05/17/23 0433 05/18/23 0535  ALBUMIN 3.2* 3.3* 3.4* 3.2* 3.4*  Radiology Studies: IR Fluoro Guide CV Line Right  Result Date: 05/17/2023 INDICATION: End-stage renal disease.  Request for a tunneled dialysis catheter. EXAM: FLUOROSCOPIC AND ULTRASOUND GUIDED PLACEMENT OF A TUNNELED DIALYSIS CATHETER Physician: Rachelle Hora. Lowella Dandy, MD MEDICATIONS: Ancef 2 g; The antibiotic was administered within an appropriate time interval prior to skin puncture. ANESTHESIA/SEDATION: Moderate (conscious) sedation was employed during this procedure. A total of Versed 1mg  and fentanyl 75 mcg was administered intravenously at the order of the provider performing the procedure. Total intra-service moderate sedation time: 26 minutes. Patient's level of consciousness and vital signs were monitored continuously by radiology nurse throughout the procedure under the supervision of the provider performing the procedure. FLUOROSCOPY TIME:  Radiation Exposure Index (as provided by the fluoroscopic device): 2 mGy Kerma COMPLICATIONS: None immediate. PROCEDURE: Informed consent was obtained for placement of a tunneled dialysis catheter. The patient was placed supine on the interventional table.  Ultrasound confirmed a patent right internal jugular vein. Ultrasound image obtained for documentation. The right neck and chest was prepped and draped in a sterile fashion. Maximal barrier sterile technique was utilized including caps, mask, sterile gowns, sterile gloves, sterile drape, hand hygiene and skin antiseptic. The existing right jugular catheter was removed with manual compression. The right neck was anesthetized with 1% lidocaine. A small incision was made with #11 blade scalpel. A 21 gauge needle directed into the right internal jugular vein with ultrasound guidance. A micropuncture dilator set was placed. A 19 cm tip to cuff Palindrome catheter was selected. The skin below the right clavicle was anesthetized and a small incision was made with an #11 blade scalpel. A subcutaneous tunnel was formed to the vein dermatotomy site. The catheter was brought through the tunnel. The vein dermatotomy site was dilated to accommodate a peel-away sheath. The catheter was placed through the peel-away sheath and directed into the central venous structures. The tip of the catheter was placed in the right atrium with fluoroscopy. Fluoroscopic images were obtained for documentation. Both lumens were found to aspirate and flush well. The proper amount of heparin was flushed in both lumens. The vein dermatotomy site was closed using a single layer of absorbable suture and Dermabond. The catheter was secured to the skin using Prolene suture. FINDINGS: Old right jugular catheter was removed. New jugular catheter tip in the right atrium. IMPRESSION: Successful placement of a right jugular tunneled dialysis catheter using ultrasound and fluoroscopic guidance. Electronically Signed   By: Richarda Overlie M.D.   On: 05/17/2023 17:48   IR US Guide Vasc Access Right  Result Date: 05/17/2023 INDICATION: End-stage renal disease.  Request for a tunneled dialysis catheter. EXAM: FLUOROSCOPIC AND ULTRASOUND GUIDED PLACEMENT OF A  TUNNELED DIALYSIS CATHETER Physician: Rachelle Hora. Lowella Dandy, MD MEDICATIONS: Ancef 2 g; The antibiotic was administered within an appropriate time interval prior to skin puncture. ANESTHESIA/SEDATION: Moderate (conscious) sedation was employed during this procedure. A total of Versed 1mg  and fentanyl 75 mcg was administered intravenously at the order of the provider performing the procedure. Total intra-service moderate sedation time: 26 minutes. Patient's level of consciousness and vital signs were monitored continuously by radiology nurse throughout the procedure under the supervision of the provider performing the procedure. FLUOROSCOPY TIME:  Radiation Exposure Index (as provided by the fluoroscopic device): 2 mGy Kerma COMPLICATIONS: None immediate. PROCEDURE: Informed consent was obtained for placement of a tunneled dialysis catheter. The patient was placed supine on the interventional table. Ultrasound confirmed a patent right internal jugular vein. Ultrasound image obtained for  documentation. The right neck and chest was prepped and draped in a sterile fashion. Maximal barrier sterile technique was utilized including caps, mask, sterile gowns, sterile gloves, sterile drape, hand hygiene and skin antiseptic. The existing right jugular catheter was removed with manual compression. The right neck was anesthetized with 1% lidocaine. A small incision was made with #11 blade scalpel. A 21 gauge needle directed into the right internal jugular vein with ultrasound guidance. A micropuncture dilator set was placed. A 19 cm tip to cuff Palindrome catheter was selected. The skin below the right clavicle was anesthetized and a small incision was made with an #11 blade scalpel. A subcutaneous tunnel was formed to the vein dermatotomy site. The catheter was brought through the tunnel. The vein dermatotomy site was dilated to accommodate a peel-away sheath. The catheter was placed through the peel-away sheath and directed into the  central venous structures. The tip of the catheter was placed in the right atrium with fluoroscopy. Fluoroscopic images were obtained for documentation. Both lumens were found to aspirate and flush well. The proper amount of heparin was flushed in both lumens. The vein dermatotomy site was closed using a single layer of absorbable suture and Dermabond. The catheter was secured to the skin using Prolene suture. FINDINGS: Old right jugular catheter was removed. New jugular catheter tip in the right atrium. IMPRESSION: Successful placement of a right jugular tunneled dialysis catheter using ultrasound and fluoroscopic guidance. Electronically Signed   By: Richarda Overlie M.D.   On: 05/17/2023 17:48      LOS: 13 days    Huey Bienenstock, MD [facility] Available via Epic secure chat 7am-7pm After these hours, please refer to coverage provider listed on [email or web address] 05/18/2023, 2:24 PM

## 2023-05-18 NOTE — Plan of Care (Signed)

## 2023-05-18 NOTE — Progress Notes (Signed)
Referral pending with [facility] admissions. Pt's case will require financial clearance due to being uninsured. Will assist as needed.   Olivia Canter Renal Navigator [phone]

## 2023-05-19 LAB — GLUCOSE, CAPILLARY
Glucose-Capillary: 155 mg/dL — ABNORMAL HIGH (ref 70–99)
Glucose-Capillary: 201 mg/dL — ABNORMAL HIGH (ref 70–99)
Glucose-Capillary: 164 mg/dL — ABNORMAL HIGH (ref 70–99)
Glucose-Capillary: 95 mg/dL (ref 70–99)

## 2023-05-19 LAB — RENAL FUNCTION PANEL
Albumin: 3.2 g/dL — ABNORMAL LOW (ref 3.5–5.0)
Anion gap: 13 (ref 5–15)
BUN: 38 mg/dL — ABNORMAL HIGH (ref 8–23)
CO2: 23 mmol/L (ref 22–32)
Calcium: 8.9 mg/dL (ref 8.9–10.3)
Chloride: 92 mmol/L — ABNORMAL LOW (ref 98–111)
Creatinine, Ser: 4.49 mg/dL — ABNORMAL HIGH (ref 0.44–1.00)
GFR, Estimated: 10 mL/min — ABNORMAL LOW (ref >60)
Glucose, Bld: 141 mg/dL — ABNORMAL HIGH (ref 70–99)
Phosphorus: 5 mg/dL — ABNORMAL HIGH (ref 2.5–4.6)
Potassium: 3.1 mmol/L — ABNORMAL LOW (ref 3.5–5.1)
Sodium: 128 mmol/L — ABNORMAL LOW (ref 135–145)

## 2023-05-19 LAB — CBC
HCT: 32 % — ABNORMAL LOW (ref 36.0–46.0)
Hemoglobin: 10.9 g/dL — ABNORMAL LOW (ref 12.0–15.0)
MCH: 31 pg (ref 26.0–34.0)
MCHC: 34.1 g/dL (ref 30.0–36.0)
MCV: 90.9 fL (ref 80.0–100.0)
Platelets: 291 10*3/uL (ref 150–400)
RBC: 3.52 MIL/uL — ABNORMAL LOW (ref 3.87–5.11)
RDW: 12.8 % (ref 11.5–15.5)
WBC: 12.8 10*3/uL — ABNORMAL HIGH (ref 4.0–10.5)
nRBC: 0 % (ref 0.0–0.2)

## 2023-05-19 LAB — CREATININE, URINE, 24 HOUR
Collection Interval-UCRE24: 24 hours
Creatinine, 24H Ur: 268 mg/d — ABNORMAL LOW (ref 600–1800)
Creatinine, Urine: 134 mg/dL
Urine Total Volume-UCRE24: 200 mL

## 2023-05-19 MED ORDER — POTASSIUM CHLORIDE CRYS ER 20 MEQ PO TBCR
40.0000 meq | EXTENDED_RELEASE_TABLET | Freq: Once | ORAL | Status: AC
  Administered 2023-05-19: 40 meq via ORAL
  Filled 2023-05-19: qty 2

## 2023-05-19 MED ORDER — CHLORHEXIDINE GLUCONATE CLOTH 2 % EX PADS: 6.0000 | MEDICATED_PAD | Freq: Every day | CUTANEOUS | Status: DC

## 2023-05-19 MED ORDER — ACETAMINOPHEN 325 MG PO TABS
650.0000 mg | ORAL_TABLET | Freq: Four times a day (QID) | ORAL | Status: DC | PRN
  Administered 2023-05-19 – 2023-05-27 (×2): 650 mg via ORAL
  Filled 2023-05-19 (×2): qty 2

## 2023-05-19 NOTE — Plan of Care (Signed)

## 2023-05-19 NOTE — Progress Notes (Signed)
Samantha [name] Progress Note   Assessment/ Plan:   Samantha [name] is a/an 72 y.o. female with a past medical history significant for HTN, DM2, PAD, CVA, HLD, CKD 3B who presents with shortness of breath and concern for volume overload   The patient was recently hospitalized prior to this hospitalization for E. coli UTI and pyelonephritis.  She also was taking an ARB as well as NSAIDs at home.  She had an AKI and was given IV fluids.  She developed some volume overload and IV fluids were stopped.  Her creatinine was 2.9 at discharge.  When she read presented for this hospitalization her creatinine was 2.4.  She was taking Lasix 40 mg daily but had ran out a couple days before arriving.       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 Unfortunately it appears that the patient has progressed to ESRD. Will also check a 24hr urine urea tues - wed to determine residual renal function to see if she would benefit from twice weekly Samantha [name] to preserve residual function.  -Temporary catheter placed by Samantha [name] on 5/11, appreciate help - looks like she is improving with UOP-- continue Lasix 80 IV BID for now - adding fluid restriction -Continue to monitor daily Cr, Dose meds for GFR -Monitor Daily I/Os, Daily weight  -Maintain MAP>65 for optimal renal perfusion.  -Avoid nephrotoxic medications including NSAIDs -Use synthetic opioids (Fentanyl/Dilaudid) if needed -Serologies have been sent to workup for possible glomerular disease but seems unlikely given no significant hematuria.  Proteinuria is most likely related to her history of diabetes and recent tubular injury. - I anticipated her labs getting better- but they are not. Continued to trend in the wrong direction, oliguric as well leading to Samantha [name]. - Samantha [name] #1 05/12/23 - Samantha [name] #2 05/14/23 - didn't do so well on Lasix challenge - Samantha [name] #3 5/20 (2L) ->  plan #4 but she is not feeling well today. Let's hold on Samantha [name] an plan on either Th or Fri.   Plan on Th for only 3 hrs ; will see if shorter treatment and lower flows will help. In future as outpt can consider HT PD, explained to family Samantha [name] and son).  24hr study not helpful as no urea sent (lab orders still say active).  - Appreciate Samantha [name] exchanging for a Samantha [name] 19cm CTT TC on 5/20 - Appreciate renal navigator working on placement. Cr was 1.7 in 2022 but had been in the 2.4-3 range recently and no signs of renal recovery.    2) Acute hypoxic respiratory failure.  Most likely related to volume overload.  Appreciate help from pulmonary critical care team.  Seems to be improving after thoracentesis and diuresis.  Continue with diuresis as above   3) Volume overload: Mostly as above.  Of note, ejection fraction is normal.  Echocardiogram did demonstrate that IVC was distended.  She seems to be improving with diuresis as well as thoracentesis.  Continue diuretics as above   4) Uncontrolled Diabetes Mellitus Type 2 with Hyperglycemia: Management per primary team   5) HTN: Medications per primary on carvedilol   6) Metabolic acidosis: Resolved.   7) Abdominal distention/fullness:  Improving with Bms and dialysis  Subjective:    Seen in room.  Not feeling well today. D/w pt and tolerated Samantha [name] Mon but has not felt well past 2 days, feels as if someone is  pushing on her entire body. Other daughter Samantha [name] bedside to translate and explained to Samantha [name]'s son.    Objective:   BP 113/74 (BP Location: Left Arm)   Pulse 87   Temp 98.3 F (36.8 C) (Oral)   Resp 15   Ht 5\' 5"  (1.651 m)   Wt 64.5 kg   SpO2 99%   BMI 23.66 kg/m   Intake/Output Summary (Last 24 hours) at 05/19/2023 1241 Last data filed at 05/19/2023 1610 Gross per 24 hour  Intake 603 ml  Output 350 ml  Net 253 ml    Weight change: 1.9 kg  Physical Exam: Gen:NAD, off O2 CVS:RRR Resp: normal WOB Abd: soft, not as  distended Ext: no LE edema ACCESS: R IJ tunneled Samantha [name] catheter  Imaging: Samantha [name] Fluoro Guide CV Line Right  Result Date: 05/17/2023 INDICATION: End-stage renal disease.  Request for a tunneled dialysis catheter. EXAM: FLUOROSCOPIC AND ULTRASOUND GUIDED PLACEMENT OF A TUNNELED DIALYSIS CATHETER Physician: Rachelle Hora. Lowella Dandy, Samantha [name] MEDICATIONS: Ancef 2 g; The antibiotic was administered within an appropriate time interval prior to skin puncture. ANESTHESIA/SEDATION: Moderate (conscious) sedation was employed during this procedure. A total of Versed 1mg  and fentanyl 75 mcg was administered intravenously at the order of the provider performing the procedure. Total intra-service moderate sedation time: 26 minutes. Patient's level of consciousness and vital signs were monitored continuously by radiology nurse throughout the procedure under the supervision of the provider performing the procedure. FLUOROSCOPY TIME:  Radiation Exposure Index (as provided by the fluoroscopic device): 2 mGy Kerma COMPLICATIONS: None immediate. PROCEDURE: Informed consent was obtained for placement of a tunneled dialysis catheter. The patient was placed supine on the interventional table. Ultrasound confirmed a patent right internal jugular vein. Ultrasound image obtained for documentation. The right neck and chest was prepped and draped in a sterile fashion. Maximal barrier sterile technique was utilized including caps, mask, sterile gowns, sterile gloves, sterile drape, hand hygiene and skin antiseptic. The existing right jugular catheter was removed with manual compression. The right neck was anesthetized with 1% lidocaine. A small incision was made with #11 blade scalpel. A 21 gauge needle directed into the right internal jugular vein with ultrasound guidance. A micropuncture dilator set was placed. A 19 cm tip to cuff Palindrome catheter was selected. The skin below the right clavicle was anesthetized and a small incision was made with an #11  blade scalpel. A subcutaneous tunnel was formed to the vein dermatotomy site. The catheter was brought through the tunnel. The vein dermatotomy site was dilated to accommodate a peel-away sheath. The catheter was placed through the peel-away sheath and directed into the central venous structures. The tip of the catheter was placed in the right atrium with fluoroscopy. Fluoroscopic images were obtained for documentation. Both lumens were found to aspirate and flush well. The proper amount of heparin was flushed in both lumens. The vein dermatotomy site was closed using a single layer of absorbable suture and Dermabond. The catheter was secured to the skin using Prolene suture. FINDINGS: Old right jugular catheter was removed. New jugular catheter tip in the right atrium. IMPRESSION: Successful placement of a right jugular tunneled dialysis catheter using ultrasound and fluoroscopic guidance. Electronically Signed   By: Richarda Overlie M.D.   On: 05/17/2023 17:48   Samantha [name] US Guide Vasc Access Right  Result Date: 05/17/2023 INDICATION: End-stage renal disease.  Request for a tunneled dialysis catheter. EXAM: FLUOROSCOPIC AND ULTRASOUND GUIDED PLACEMENT OF A TUNNELED DIALYSIS CATHETER Physician: Rachelle Hora. Lowella Dandy, Samantha [name] MEDICATIONS:  Ancef 2 g; The antibiotic was administered within an appropriate time interval prior to skin puncture. ANESTHESIA/SEDATION: Moderate (conscious) sedation was employed during this procedure. A total of Versed 1mg  and fentanyl 75 mcg was administered intravenously at the order of the provider performing the procedure. Total intra-service moderate sedation time: 26 minutes. Patient's level of consciousness and vital signs were monitored continuously by radiology nurse throughout the procedure under the supervision of the provider performing the procedure. FLUOROSCOPY TIME:  Radiation Exposure Index (as provided by the fluoroscopic device): 2 mGy Kerma COMPLICATIONS: None immediate. PROCEDURE: Informed  consent was obtained for placement of a tunneled dialysis catheter. The patient was placed supine on the interventional table. Ultrasound confirmed a patent right internal jugular vein. Ultrasound image obtained for documentation. The right neck and chest was prepped and draped in a sterile fashion. Maximal barrier sterile technique was utilized including caps, mask, sterile gowns, sterile gloves, sterile drape, hand hygiene and skin antiseptic. The existing right jugular catheter was removed with manual compression. The right neck was anesthetized with 1% lidocaine. A small incision was made with #11 blade scalpel. A 21 gauge needle directed into the right internal jugular vein with ultrasound guidance. A micropuncture dilator set was placed. A 19 cm tip to cuff Palindrome catheter was selected. The skin below the right clavicle was anesthetized and a small incision was made with an #11 blade scalpel. A subcutaneous tunnel was formed to the vein dermatotomy site. The catheter was brought through the tunnel. The vein dermatotomy site was dilated to accommodate a peel-away sheath. The catheter was placed through the peel-away sheath and directed into the central venous structures. The tip of the catheter was placed in the right atrium with fluoroscopy. Fluoroscopic images were obtained for documentation. Both lumens were found to aspirate and flush well. The proper amount of heparin was flushed in both lumens. The vein dermatotomy site was closed using a single layer of absorbable suture and Dermabond. The catheter was secured to the skin using Prolene suture. FINDINGS: Old right jugular catheter was removed. New jugular catheter tip in the right atrium. IMPRESSION: Successful placement of a right jugular tunneled dialysis catheter using ultrasound and fluoroscopic guidance. Electronically Signed   By: Richarda Overlie M.D.   On: 05/17/2023 17:48    Labs: BMET Recent Labs  Lab 05/13/23 0356 05/14/23 0840  05/15/23 0337 05/16/23 0325 05/17/23 0433 05/18/23 0535 05/19/23 0355  NA 127* 125* 130* 128* 131* 132* 128*  K 3.8 3.4* 3.3* 3.2* 3.2* 3.7 3.1*  CL 88* 86* 92* 91* 94* 94* 92*  CO2 24 25 26 23 23 24 23   GLUCOSE 131* 243* 135* 115* 137* 134* 141*  BUN 38* 51* 30* 42* 53* 28* 38*  CREATININE 3.12* 3.65* 3.06* 3.75* 3.88* 3.69* 4.49*  CALCIUM 8.9 8.9 9.1 9.1 9.2 9.6 8.9  PHOS 4.6 5.4* 4.8* 5.2* 5.5* 5.3* 5.0*   CBC Recent Labs  Lab 05/12/23 1435 05/14/23 0840 05/16/23 0325 05/19/23 1132  WBC 9.3 8.0 9.0 12.8*  HGB 11.1* 10.6* 11.0* 10.9*  HCT 31.7* 30.5* 32.0* 32.0*  MCV 88.5 89.7 91.4 90.9  PLT 381 321 319 291    Medications:     atorvastatin  80 mg Oral Daily   bisacodyl  10 mg Rectal Daily   carvedilol  6.25 mg Oral BID WC   Chlorhexidine Gluconate Cloth  6 each Topical Q0600   Chlorhexidine Gluconate Cloth  6 each Topical Q0600   clopidogrel  75 mg Oral Daily  diphenhydrAMINE  25 mg Intravenous Once   docusate sodium  100 mg Oral BID   fluticasone  2 spray Each Nare Daily   furosemide  80 mg Intravenous BID   guaiFENesin  600 mg Oral BID   heparin  5,000 Units Subcutaneous Q8H   insulin aspart  0-15 Units Subcutaneous TID WC   insulin aspart  0-5 Units Subcutaneous QHS   insulin aspart  3 Units Subcutaneous TID WC   insulin glargine-yfgn  15 Units Subcutaneous Daily   oxymetazoline  1 spray Each Nare Once   sodium chloride flush  3 mL Intravenous Q12H

## 2023-05-19 NOTE — Progress Notes (Signed)
Pt dialysis on hold per Dr. Juel Burrow

## 2023-05-19 NOTE — Progress Notes (Addendum)
PROGRESS NOTE        PATIENT DETAILS Name: Samantha [name] Age: 72 y.o. Sex: female Date of Birth: 11/08/1951 Admit Date: 05/05/2023 Admitting Physician Meredeth Ide, MD ZOX:[id number], Odette Horns, MD  Brief Summary: Patient is a 72 y.o.  female with history of HTN, DM-2, PAD, CVA, depression/anxiety-who presented with acute hypoxic respiratory failure in the setting of pulm edema with worsening renal function.  Significant events: 5/8>> admit to [facility]  Significant studies: 5 8>> CT chest/abdomen/pelvis: New bilateral pleural effusions, right> left parenchymal opacities 5/8>> renal ultrasound: No hydronephrosis 5/9??  Echo: EF 55-60% 5/12>> CT chest: Increasing interstitial/patchy groundglass opacities-decreasing small bilateral pleural effusions. 5/14>> gastric emptying study: Nondiagnostic due to emesis  Significant microbiology data: 5/8>> COVID PCR: Negative 5/8>> blood culture: Negative 5/11>> pleural fluid culture: No growth  Procedures: 5/11>> thoracocentesis (pleural fluid cytology negative) 5/11>> nontunneled dialysis catheter by PCCM 5/20>> tunneled dialysis catheter by IR   Consults: PCCM Nephrology Interventional radiology  Subjective: Lying comfortably in bed-denies any chest pain or shortness of breath.  Objective: Vitals: Blood pressure 113/74, pulse 87, temperature 98.3 F (36.8 C), temperature source Oral, resp. rate 15, height 5\' 5"  (1.651 m), weight 64.5 kg, SpO2 99 %.   Exam: Gen Exam:Alert awake-not in any distress HEENT:atraumatic, normocephalic Chest: B/L clear to auscultation anteriorly CVS:S1S2 regular Abdomen:soft non tender, non distended Extremities:no edema Neurology: Non focal Skin: no rash  Pertinent Labs/Radiology:    Latest Ref Rng & Units 05/16/2023    3:25 AM 05/14/2023    8:40 AM 05/12/2023    2:35 PM  CBC  WBC 4.0 - 10.5 K/uL 9.0  8.0  9.3   Hemoglobin 12.0 - 15.0 g/dL 40.9  81.1  91.4   Hematocrit  36.0 - 46.0 % 32.0  30.5  31.7   Platelets 150 - 400 K/uL 319  321  381     Lab Results  Component Value Date   NA 128 (L) 05/19/2023   K 3.1 (L) 05/19/2023   CL 92 (L) 05/19/2023   CO2 23 05/19/2023      Assessment/Plan: AKI on CKD stage IIIb Likely hemodynamically mediated-suspicion that she may have progressed to ESRD Awaiting 24-hour urine collection Nephrology following and directing HD care/scheduled Continue to monitor for signs of renal recovery  Acute hypoxic respiratory failure Secondary to acute pulm edema in the setting of worsening renal function/pleural effusion.  (PNA ruled out) Volume being managed with HD-dose IV Lasix Currently improved-and on room air  Hyponatremia Due to impaired water excretion in the setting of worsening renal function Should improve with ongoing HD/diuresis  Hypokalemia Replete/recheck  Acute on chronic HFrEF Acute pulm edema Volume status stable with HD/IV Lasix  History of CAD No anginal symptoms Echo with stable EF On Plavix/beta-blocker/statin  HTN BP stable on Coreg Follow/optimize  HLD Statin  DM-2 (A1c 7.7 on 4/18) CBGs stable on 15 units of Semglee daily, 3 units of NovoLog with meals and SSI  Recent Labs    05/18/23 1544 05/18/23 2033 05/19/23 0733  GLUCAP 234* 171* 155*    Subcentimeter left thyroid nodule Per radiology-not clinically significant-no follow-up recommendations.  BMI: Estimated body mass index is 23.66 kg/m as calculated from the following:   Height as of this encounter: 5\' 5"  (1.651 m).   Weight as of this encounter: 64.5 kg.   Code status:   Code  Status: Full Code   DVT Prophylaxis: heparin injection 5,000 Units Start: 05/09/23 0600   Family Communication: None at bedside   Disposition Plan: Status is: Inpatient Remains inpatient appropriate because: Severity of illness   Planned Discharge Destination:Home   Diet: Diet Order             Diet renal with fluid  restriction Fluid restriction: 1200 mL Fluid; Room service appropriate? Yes; Fluid consistency: Thin  Diet effective now                     Antimicrobial agents: Anti-infectives (From admission, onward)    Start     Dose/Rate Route Frequency Ordered Stop   05/17/23 1500  ceFAZolin (ANCEF) IVPB 2g/100 mL premix        2 g 200 mL/hr over 30 Minutes Intravenous To Radiology 05/16/23 1204 05/17/23 1537   05/08/23 1000  ceFAZolin (ANCEF) IVPB 2g/100 mL premix        2 g 200 mL/hr over 30 Minutes Intravenous To Radiology 05/07/23 1537 05/09/23 1000   05/05/23 1515  cefTRIAXone (ROCEPHIN) 1 g in sodium chloride 0.9 % 100 mL IVPB        1 g 200 mL/hr over 30 Minutes Intravenous  Once 05/05/23 1510 05/05/23 1642   05/05/23 1515  azithromycin (ZITHROMAX) 500 mg in sodium chloride 0.9 % 250 mL IVPB        500 mg 250 mL/hr over 60 Minutes Intravenous  Once 05/05/23 1510 05/05/23 1750        MEDICATIONS: Scheduled Meds:  atorvastatin  80 mg Oral Daily   bisacodyl  10 mg Rectal Daily   carvedilol  6.25 mg Oral BID WC   Chlorhexidine Gluconate Cloth  6 each Topical Q0600   Chlorhexidine Gluconate Cloth  6 each Topical Q0600   clopidogrel  75 mg Oral Daily   diphenhydrAMINE  25 mg Intravenous Once   docusate sodium  100 mg Oral BID   fluticasone  2 spray Each Nare Daily   furosemide  80 mg Intravenous BID   guaiFENesin  600 mg Oral BID   heparin  5,000 Units Subcutaneous Q8H   insulin aspart  0-15 Units Subcutaneous TID WC   insulin aspart  0-5 Units Subcutaneous QHS   insulin aspart  3 Units Subcutaneous TID WC   insulin glargine-yfgn  15 Units Subcutaneous Daily   oxymetazoline  1 spray Each Nare Once   sodium chloride flush  3 mL Intravenous Q12H   Continuous Infusions:  sodium chloride Stopped (05/09/23 2241)   PRN Meds:.sodium chloride, albuterol, heparin, LORazepam, melatonin, ondansetron **OR** ondansetron (ZOFRAN) IV, mouth rinse, oxyCODONE, sodium chloride, sodium  chloride flush   I have personally reviewed following labs and imaging studies  LABORATORY DATA: CBC: Recent Labs  Lab 05/12/23 1435 05/14/23 0840 05/16/23 0325  WBC 9.3 8.0 9.0  HGB 11.1* 10.6* 11.0*  HCT 31.7* 30.5* 32.0*  MCV 88.5 89.7 91.4  PLT 381 321 319    Basic Metabolic Panel: Recent Labs  Lab 05/13/23 0356 05/14/23 0840 05/15/23 0337 05/16/23 0325 05/17/23 0433 05/18/23 0535 05/19/23 0355  NA 127*   < > 130* 128* 131* 132* 128*  K 3.8   < > 3.3* 3.2* 3.2* 3.7 3.1*  CL 88*   < > 92* 91* 94* 94* 92*  CO2 24   < > 26 23 23 24 23   GLUCOSE 131*   < > 135* 115* 137* 134* 141*  BUN 38*   < >  30* 42* 53* 28* 38*  CREATININE 3.12*   < > 3.06* 3.75* 3.88* 3.69* 4.49*  CALCIUM 8.9   < > 9.1 9.1 9.2 9.6 8.9  MG 2.0  --   --   --   --   --   --   PHOS 4.6   < > 4.8* 5.2* 5.5* 5.3* 5.0*   < > = values in this interval not displayed.    GFR: Estimated Creatinine Clearance: 10.6 mL/min (A) (by C-G formula based on SCr of 4.49 mg/dL (H)).  Liver Function Tests: Recent Labs  Lab 05/15/23 0337 05/16/23 0325 05/17/23 0433 05/18/23 0535 05/19/23 0355  ALBUMIN 3.3* 3.4* 3.2* 3.4* 3.2*   No results for input(s): "LIPASE", "AMYLASE" in the last 168 hours. No results for input(s): "AMMONIA" in the last 168 hours.  Coagulation Profile: Recent Labs  Lab 05/17/23 0433  INR 1.1    Cardiac Enzymes: No results for input(s): "CKTOTAL", "CKMB", "CKMBINDEX", "TROPONINI" in the last 168 hours.  BNP (last 3 results) No results for input(s): "PROBNP" in the last 8760 hours.  Lipid Profile: No results for input(s): "CHOL", "HDL", "LDLCALC", "TRIG", "CHOLHDL", "LDLDIRECT" in the last 72 hours.  Thyroid Function Tests: No results for input(s): "TSH", "T4TOTAL", "FREET4", "T3FREE", "THYROIDAB" in the last 72 hours.  Anemia Panel: No results for input(s): "VITAMINB12", "FOLATE", "FERRITIN", "TIBC", "IRON", "RETICCTPCT" in the last 72 hours.  Urine analysis:     Component Value Date/Time   COLORURINE YELLOW 05/05/2023 0927   APPEARANCEUR CLEAR 05/05/2023 0927   LABSPEC 1.010 05/05/2023 0927   PHURINE 6.0 05/05/2023 0927   GLUCOSEU 50 (A) 05/05/2023 0927   HGBUR NEGATIVE 05/05/2023 0927   BILIRUBINUR NEGATIVE 05/05/2023 0927   BILIRUBINUR negative 08/18/2021 1207   BILIRUBINUR neg 06/28/2017 1508   KETONESUR NEGATIVE 05/05/2023 0927   PROTEINUR 100 (A) 05/05/2023 0927   UROBILINOGEN 0.2 08/18/2021 1207   UROBILINOGEN 0.2 01/15/2015 1032   NITRITE NEGATIVE 05/05/2023 0927   LEUKOCYTESUR NEGATIVE 05/05/2023 0927    Sepsis Labs: Lactic Acid, Venous    Component Value Date/Time   LATICACIDVEN 2.22 (H) 01/10/2015 1339    MICROBIOLOGY: No results found for this or any previous visit (from the past 240 hour(s)).  RADIOLOGY STUDIES/RESULTS: IR Fluoro Guide CV Line Right  Result Date: 05/17/2023 INDICATION: End-stage renal disease.  Request for a tunneled dialysis catheter. EXAM: FLUOROSCOPIC AND ULTRASOUND GUIDED PLACEMENT OF A TUNNELED DIALYSIS CATHETER Physician: Rachelle Hora. Lowella Dandy, MD MEDICATIONS: Ancef 2 g; The antibiotic was administered within an appropriate time interval prior to skin puncture. ANESTHESIA/SEDATION: Moderate (conscious) sedation was employed during this procedure. A total of Versed 1mg  and fentanyl 75 mcg was administered intravenously at the order of the provider performing the procedure. Total intra-service moderate sedation time: 26 minutes. Patient's level of consciousness and vital signs were monitored continuously by radiology nurse throughout the procedure under the supervision of the provider performing the procedure. FLUOROSCOPY TIME:  Radiation Exposure Index (as provided by the fluoroscopic device): 2 mGy Kerma COMPLICATIONS: None immediate. PROCEDURE: Informed consent was obtained for placement of a tunneled dialysis catheter. The patient was placed supine on the interventional table. Ultrasound confirmed a patent right  internal jugular vein. Ultrasound image obtained for documentation. The right neck and chest was prepped and draped in a sterile fashion. Maximal barrier sterile technique was utilized including caps, mask, sterile gowns, sterile gloves, sterile drape, hand hygiene and skin antiseptic. The existing right jugular catheter was removed with manual compression. The right neck was  anesthetized with 1% lidocaine. A small incision was made with #11 blade scalpel. A 21 gauge needle directed into the right internal jugular vein with ultrasound guidance. A micropuncture dilator set was placed. A 19 cm tip to cuff Palindrome catheter was selected. The skin below the right clavicle was anesthetized and a small incision was made with an #11 blade scalpel. A subcutaneous tunnel was formed to the vein dermatotomy site. The catheter was brought through the tunnel. The vein dermatotomy site was dilated to accommodate a peel-away sheath. The catheter was placed through the peel-away sheath and directed into the central venous structures. The tip of the catheter was placed in the right atrium with fluoroscopy. Fluoroscopic images were obtained for documentation. Both lumens were found to aspirate and flush well. The proper amount of heparin was flushed in both lumens. The vein dermatotomy site was closed using a single layer of absorbable suture and Dermabond. The catheter was secured to the skin using Prolene suture. FINDINGS: Old right jugular catheter was removed. New jugular catheter tip in the right atrium. IMPRESSION: Successful placement of a right jugular tunneled dialysis catheter using ultrasound and fluoroscopic guidance. Electronically Signed   By: Richarda Overlie M.D.   On: 05/17/2023 17:48   IR US Guide Vasc Access Right  Result Date: 05/17/2023 INDICATION: End-stage renal disease.  Request for a tunneled dialysis catheter. EXAM: FLUOROSCOPIC AND ULTRASOUND GUIDED PLACEMENT OF A TUNNELED DIALYSIS CATHETER Physician: Rachelle Hora. Lowella Dandy, MD MEDICATIONS: Ancef 2 g; The antibiotic was administered within an appropriate time interval prior to skin puncture. ANESTHESIA/SEDATION: Moderate (conscious) sedation was employed during this procedure. A total of Versed 1mg  and fentanyl 75 mcg was administered intravenously at the order of the provider performing the procedure. Total intra-service moderate sedation time: 26 minutes. Patient's level of consciousness and vital signs were monitored continuously by radiology nurse throughout the procedure under the supervision of the provider performing the procedure. FLUOROSCOPY TIME:  Radiation Exposure Index (as provided by the fluoroscopic device): 2 mGy Kerma COMPLICATIONS: None immediate. PROCEDURE: Informed consent was obtained for placement of a tunneled dialysis catheter. The patient was placed supine on the interventional table. Ultrasound confirmed a patent right internal jugular vein. Ultrasound image obtained for documentation. The right neck and chest was prepped and draped in a sterile fashion. Maximal barrier sterile technique was utilized including caps, mask, sterile gowns, sterile gloves, sterile drape, hand hygiene and skin antiseptic. The existing right jugular catheter was removed with manual compression. The right neck was anesthetized with 1% lidocaine. A small incision was made with #11 blade scalpel. A 21 gauge needle directed into the right internal jugular vein with ultrasound guidance. A micropuncture dilator set was placed. A 19 cm tip to cuff Palindrome catheter was selected. The skin below the right clavicle was anesthetized and a small incision was made with an #11 blade scalpel. A subcutaneous tunnel was formed to the vein dermatotomy site. The catheter was brought through the tunnel. The vein dermatotomy site was dilated to accommodate a peel-away sheath. The catheter was placed through the peel-away sheath and directed into the central venous structures. The tip of the  catheter was placed in the right atrium with fluoroscopy. Fluoroscopic images were obtained for documentation. Both lumens were found to aspirate and flush well. The proper amount of heparin was flushed in both lumens. The vein dermatotomy site was closed using a single layer of absorbable suture and Dermabond. The catheter was secured to the skin using Prolene suture. FINDINGS:  Old right jugular catheter was removed. New jugular catheter tip in the right atrium. IMPRESSION: Successful placement of a right jugular tunneled dialysis catheter using ultrasound and fluoroscopic guidance. Electronically Signed   By: Richarda Overlie M.D.   On: 05/17/2023 17:48     LOS: 14 days   Jeoffrey Massed, MD  [facility]    To contact the attending provider between 7A-7P or the covering provider during after hours 7P-7A, please log into the web site [email or web address] and access using universal [place] password for that web site. If you do not have the password, please call the hospital operator.  05/19/2023, 9:53 AM

## 2023-05-19 NOTE — Progress Notes (Signed)
Contacted [facility] admissions to request an update on pt's referral. Will await a response.   Olivia Canter Renal Navigator [phone]

## 2023-05-20 LAB — GLUCOSE, CAPILLARY
Glucose-Capillary: 152 mg/dL — ABNORMAL HIGH (ref 70–99)
Glucose-Capillary: 187 mg/dL — ABNORMAL HIGH (ref 70–99)
Glucose-Capillary: 130 mg/dL — ABNORMAL HIGH (ref 70–99)

## 2023-05-20 LAB — RENAL FUNCTION PANEL
Albumin: 3.2 g/dL — ABNORMAL LOW (ref 3.5–5.0)
Anion gap: 15 (ref 5–15)
BUN: 46 mg/dL — ABNORMAL HIGH (ref 8–23)
CO2: 23 mmol/L (ref 22–32)
Calcium: 9.4 mg/dL (ref 8.9–10.3)
Chloride: 92 mmol/L — ABNORMAL LOW (ref 98–111)
Creatinine, Ser: 4.9 mg/dL — ABNORMAL HIGH (ref 0.44–1.00)
GFR, Estimated: 9 mL/min — ABNORMAL LOW (ref >60)
Glucose, Bld: 209 mg/dL — ABNORMAL HIGH (ref 70–99)
Phosphorus: 5.4 mg/dL — ABNORMAL HIGH (ref 2.5–4.6)
Potassium: 4 mmol/L (ref 3.5–5.1)
Sodium: 130 mmol/L — ABNORMAL LOW (ref 135–145)

## 2023-05-20 LAB — UREA NITROGEN (UUN), 24 HR URINE
Total Volume: 200
Urea Nitrogen, 24H Ur: 1 g/24 hr — ABNORMAL LOW (ref 4–16)
Urea Nitrogen, Ur: 253 mg/dL

## 2023-05-20 LAB — CULTURE, BLOOD (ROUTINE X 2)
Culture: NO GROWTH
Special Requests: ADEQUATE

## 2023-05-20 LAB — HEPATITIS B SURFACE ANTIBODY,QUALITATIVE: Hep B S Ab: NONREACTIVE

## 2023-05-20 LAB — HEPATITIS B SURFACE ANTIGEN: Hepatitis B Surface Ag: NONREACTIVE

## 2023-05-20 LAB — HEPATITIS B CORE ANTIBODY, TOTAL: Hep B Core Total Ab: NONREACTIVE

## 2023-05-20 MED ORDER — HEPARIN SODIUM (PORCINE) 1000 UNIT/ML IJ SOLN
INTRAMUSCULAR | Status: AC
  Administered 2023-05-20: 3200 [IU]
  Filled 2023-05-20: qty 4

## 2023-05-20 NOTE — Progress Notes (Signed)
PT Cancellation Note  Patient Details Name: Samantha [name] MRN: [id number] DOB: June 01, 1951   Cancelled Treatment:    Reason Eval/Treat Not Completed: Patient declined, no reason specified;Fatigue/lethargy limiting ability to participate (Pt reporting too much pain in abdomen and too tired. Pt's eyes closed throughout and mumbled/groggy speech. Will follow up at later date/time as schedule allows and pt able.)   Renaldo Fiddler PT, DPT Acute Rehabilitation Services Office [phone]  05/20/23 1:59 PM

## 2023-05-20 NOTE — Progress Notes (Signed)
Pt has been accepted at [facility] on TTS 11:30 chair time. Pt can start on Saturday and will need to arrive at 11:00 am to complete paperwork prior to treatment. Spoke to pt's daughter via phone. Daughter advised of above info and agreeable to plan. Also text schedule letter to pt's daughter with info noted as well. Daughter states she can go with pt to first treatment to assist with translating paperwork. Arrangements added to AVS as well. Update provided to attending, nephrologist, pt's RN, RN CM, and CSW. Team also advised that pt's daughter is requesting assistance with transportation options/resources. Will assist as needed.   Olivia Canter Renal Navigator [phone]

## 2023-05-20 NOTE — Progress Notes (Signed)
Received patient in bed to unit.  Alert and oriented.  Informed consent signed and in chart.   TX duration:3.25  Patient tolerated well.  Transported back to the room  Alert, without acute distress.  Hand-off given to patient's nurse.   Access used: right HD cath Access issues: none  Total UF removed: 1L Medication(s) given: none   05/20/23 1150  Vitals  Temp 98 F (36.7 C)  Temp Source Oral  BP 110/68  MAP (mmHg) 81  BP Location Left Arm  BP Method Automatic  Patient Position (if appropriate) Lying  Pulse Rate 87  Pulse Rate Source Monitor  ECG Heart Rate 84  Resp 15  Oxygen Therapy  SpO2 95 %  O2 Device Room Air  During Treatment Monitoring  HD Safety Checks Performed Yes  Intra-Hemodialysis Comments Tx completed;Tolerated well  Dialysis Fluid Bolus Normal Saline  Bolus Amount (mL) 300 mL      Deniz Eskridge S [facility]

## 2023-05-20 NOTE — Progress Notes (Signed)
PROGRESS NOTE        PATIENT DETAILS Name: Samantha [name] Age: 72 y.o. Sex: female Date of Birth: 1951-04-17 Admit Date: 05/05/2023 Admitting Physician Meredeth Ide, MD ZOX:[id number], Odette Horns, MD  Brief Summary: Patient is a 72 y.o.  female with history of HTN, DM-2, PAD, CVA, depression/anxiety-who presented with acute hypoxic respiratory failure in the setting of pulm edema with worsening renal function.  Significant events: 5/8>> admit to [facility]  Significant studies: 5/ 8>> CT chest/abdomen/pelvis: New bilateral pleural effusions, right> left parenchymal opacities 5/8>> renal ultrasound: No hydronephrosis 5/9??  Echo: EF 55-60% 5/12>> CT chest: Increasing interstitial/patchy groundglass opacities-decreasing small bilateral pleural effusions. 5/14>> gastric emptying study: Nondiagnostic due to emesis  Significant microbiology data: 5/8>> COVID PCR: Negative 5/8>> blood culture: Negative 5/11>> pleural fluid culture: No growth 5/22>> blood culture: No growth  Procedures: 5/11>> thoracocentesis (pleural fluid cytology negative) 5/11>> nontunneled dialysis catheter by PCCM 5/20>> tunneled dialysis catheter by IR   Consults: PCCM Nephrology Interventional radiology  Subjective: Had some fever with chills yesterday-none today-feels much better.  No nausea or vomiting.  No diarrhea.  Denies any abdominal pain.  Objective: Vitals: Blood pressure 110/68, pulse 87, temperature 98 F (36.7 C), temperature source Oral, resp. rate 15, height 5\' 5"  (1.651 m), weight 63.2 kg, SpO2 95 %.   Exam: Gen Exam:Alert awake-not in any distress HEENT:atraumatic, normocephalic Chest: B/L clear to auscultation anteriorly CVS:S1S2 regular Abdomen: Soft-no RUQ tenderness. Extremities:no edema Neurology: Non focal Skin: no rash  Pertinent Labs/Radiology:    Latest Ref Rng & Units 05/19/2023   11:32 AM 05/16/2023    3:25 AM 05/14/2023    8:40 AM  CBC  WBC 4.0  - 10.5 K/uL 12.8  9.0  8.0   Hemoglobin 12.0 - 15.0 g/dL 40.9  81.1  91.4   Hematocrit 36.0 - 46.0 % 32.0  32.0  30.5   Platelets 150 - 400 K/uL 291  319  321     Lab Results  Component Value Date   NA 130 (L) 05/20/2023   K 4.0 05/20/2023   CL 92 (L) 05/20/2023   CO2 23 05/20/2023      Assessment/Plan: AKI on CKD stage IIIb Likely hemodynamically mediated-suspicion that she may have progressed to ESRD Nephrology following and directing HD care/scheduled Continue to monitor for signs of renal recovery  Acute hypoxic respiratory failure Secondary to acute pulm edema in the setting of worsening renal function/pleural effusion.  (PNA ruled out) Volume being managed with HD-dose IV Lasix Currently improved-and on room air  Low-grade fever on 5/22 Had chills Not making urine per nursing staff Denies diarrhea/abdominal pain/cough/shortness of breath Blood cultures 5/22 negative so far Follow closely-monitor off antibiotics.  Hyponatremia Due to impaired water excretion in the setting of worsening renal function Improving with ongoing volume removal with hemodialysis.  Hypokalemia Replete/recheck  Acute on chronic HFrEF Acute pulm edema Volume status stable with HD/IV Lasix  History of CAD No anginal symptoms Echo with stable EF On Plavix/beta-blocker/statin  HTN BP stable on Coreg Follow/optimize  HLD Statin  DM-2 (A1c 7.7 on 4/18) CBGs stable on 15 units of Semglee daily, 3 units of NovoLog with meals and SSI  Recent Labs    05/19/23 1153 05/19/23 1602 05/19/23 2006  GLUCAP 201* 164* 95     Subcentimeter left thyroid nodule Per radiology-not clinically significant-no follow-up recommendations.  BMI: Estimated body mass index is 23.19 kg/m as calculated from the following:   Height as of this encounter: 5\' 5"  (1.651 m).   Weight as of this encounter: 63.2 kg.   Code status:   Code Status: Full Code   DVT Prophylaxis: heparin injection 5,000  Units Start: 05/09/23 0600   Family Communication: None at bedside   Disposition Plan: Status is: Inpatient Remains inpatient appropriate because: Severity of illness   Planned Discharge Destination:Home   Diet: Diet Order             Diet renal with fluid restriction Fluid restriction: 1200 mL Fluid; Room service appropriate? Yes; Fluid consistency: Thin  Diet effective now                     Antimicrobial agents: Anti-infectives (From admission, onward)    Start     Dose/Rate Route Frequency Ordered Stop   05/17/23 1500  ceFAZolin (ANCEF) IVPB 2g/100 mL premix        2 g 200 mL/hr over 30 Minutes Intravenous To Radiology 05/16/23 1204 05/17/23 1537   05/08/23 1000  ceFAZolin (ANCEF) IVPB 2g/100 mL premix        2 g 200 mL/hr over 30 Minutes Intravenous To Radiology 05/07/23 1537 05/09/23 1000   05/05/23 1515  cefTRIAXone (ROCEPHIN) 1 g in sodium chloride 0.9 % 100 mL IVPB        1 g 200 mL/hr over 30 Minutes Intravenous  Once 05/05/23 1510 05/05/23 1642   05/05/23 1515  azithromycin (ZITHROMAX) 500 mg in sodium chloride 0.9 % 250 mL IVPB        500 mg 250 mL/hr over 60 Minutes Intravenous  Once 05/05/23 1510 05/05/23 1750        MEDICATIONS: Scheduled Meds:  atorvastatin  80 mg Oral Daily   bisacodyl  10 mg Rectal Daily   carvedilol  6.25 mg Oral BID WC   Chlorhexidine Gluconate Cloth  6 each Topical Q0600   Chlorhexidine Gluconate Cloth  6 each Topical Q0600   Chlorhexidine Gluconate Cloth  6 each Topical Q0600   clopidogrel  75 mg Oral Daily   diphenhydrAMINE  25 mg Intravenous Once   docusate sodium  100 mg Oral BID   fluticasone  2 spray Each Nare Daily   furosemide  80 mg Intravenous BID   guaiFENesin  600 mg Oral BID   heparin  5,000 Units Subcutaneous Q8H   heparin sodium (porcine)       insulin aspart  0-15 Units Subcutaneous TID WC   insulin aspart  0-5 Units Subcutaneous QHS   insulin aspart  3 Units Subcutaneous TID WC   insulin  glargine-yfgn  15 Units Subcutaneous Daily   oxymetazoline  1 spray Each Nare Once   sodium chloride flush  3 mL Intravenous Q12H   Continuous Infusions:  sodium chloride Stopped (05/09/23 2241)   PRN Meds:.sodium chloride, acetaminophen, albuterol, heparin, heparin sodium (porcine), LORazepam, melatonin, ondansetron **OR** ondansetron (ZOFRAN) IV, mouth rinse, oxyCODONE, sodium chloride, sodium chloride flush   I have personally reviewed following labs and imaging studies  LABORATORY DATA: CBC: Recent Labs  Lab 05/14/23 0840 05/16/23 0325 05/19/23 1132  WBC 8.0 9.0 12.8*  HGB 10.6* 11.0* 10.9*  HCT 30.5* 32.0* 32.0*  MCV 89.7 91.4 90.9  PLT 321 319 291     Basic Metabolic Panel: Recent Labs  Lab 05/16/23 0325 05/17/23 0433 05/18/23 0535 05/19/23 0355 05/20/23 0422  NA 128* 131* 132* 128*  130*  K 3.2* 3.2* 3.7 3.1* 4.0  CL 91* 94* 94* 92* 92*  CO2 23 23 24 23 23   GLUCOSE 115* 137* 134* 141* 209*  BUN 42* 53* 28* 38* 46*  CREATININE 3.75* 3.88* 3.69* 4.49* 4.90*  CALCIUM 9.1 9.2 9.6 8.9 9.4  PHOS 5.2* 5.5* 5.3* 5.0* 5.4*     GFR: Estimated Creatinine Clearance: 9.8 mL/min (A) (by C-G formula based on SCr of 4.9 mg/dL (H)).  Liver Function Tests: Recent Labs  Lab 05/16/23 0325 05/17/23 0433 05/18/23 0535 05/19/23 0355 05/20/23 0422  ALBUMIN 3.4* 3.2* 3.4* 3.2* 3.2*    No results for input(s): "LIPASE", "AMYLASE" in the last 168 hours. No results for input(s): "AMMONIA" in the last 168 hours.  Coagulation Profile: Recent Labs  Lab 05/17/23 0433  INR 1.1     Cardiac Enzymes: No results for input(s): "CKTOTAL", "CKMB", "CKMBINDEX", "TROPONINI" in the last 168 hours.  BNP (last 3 results) No results for input(s): "PROBNP" in the last 8760 hours.  Lipid Profile: No results for input(s): "CHOL", "HDL", "LDLCALC", "TRIG", "CHOLHDL", "LDLDIRECT" in the last 72 hours.  Thyroid Function Tests: No results for input(s): "TSH", "T4TOTAL", "FREET4",  "T3FREE", "THYROIDAB" in the last 72 hours.  Anemia Panel: No results for input(s): "VITAMINB12", "FOLATE", "FERRITIN", "TIBC", "IRON", "RETICCTPCT" in the last 72 hours.  Urine analysis:    Component Value Date/Time   COLORURINE YELLOW 05/05/2023 0927   APPEARANCEUR CLEAR 05/05/2023 0927   LABSPEC 1.010 05/05/2023 0927   PHURINE 6.0 05/05/2023 0927   GLUCOSEU 50 (A) 05/05/2023 0927   HGBUR NEGATIVE 05/05/2023 0927   BILIRUBINUR NEGATIVE 05/05/2023 0927   BILIRUBINUR negative 08/18/2021 1207   BILIRUBINUR neg 06/28/2017 1508   KETONESUR NEGATIVE 05/05/2023 0927   PROTEINUR 100 (A) 05/05/2023 0927   UROBILINOGEN 0.2 08/18/2021 1207   UROBILINOGEN 0.2 01/15/2015 1032   NITRITE NEGATIVE 05/05/2023 0927   LEUKOCYTESUR NEGATIVE 05/05/2023 0927    Sepsis Labs: Lactic Acid, Venous    Component Value Date/Time   LATICACIDVEN 2.22 (H) 01/10/2015 1339    MICROBIOLOGY: Recent Results (from the past 240 hour(s))  Culture, blood (Routine X 2) w Reflex to ID Panel     Status: None (Preliminary result)   Collection Time: 05/19/23  6:06 PM   Specimen: BLOOD LEFT ARM  Result Value Ref Range Status   Specimen Description BLOOD LEFT ARM  Final   Special Requests   Final    BOTTLES DRAWN AEROBIC AND ANAEROBIC Blood Culture adequate volume   Culture   Final    NO GROWTH < 12 HOURS Performed at [facility], [address] [place], [place] [address]    Report Status PENDING  Incomplete  Culture, blood (Routine X 2) w Reflex to ID Panel     Status: None (Preliminary result)   Collection Time: 05/19/23  6:06 PM   Specimen: BLOOD LEFT ARM  Result Value Ref Range Status   Specimen Description BLOOD LEFT ARM  Final   Special Requests   Final    BOTTLES DRAWN AEROBIC AND ANAEROBIC Blood Culture adequate volume   Culture   Final    NO GROWTH < 12 HOURS Performed at [facility], [address], [place], [place] [address]    Report Status PENDING  Incomplete    RADIOLOGY  STUDIES/RESULTS: No results found.   LOS: 15 days   Jeoffrey Massed, MD  [facility]    To contact the attending provider between 7A-7P or the covering  provider during after hours 7P-7A, please log into the web site [email or web address] and access using universal [place] password for that web site. If you do not have the password, please call the hospital operator.  05/20/2023, 11:56 AM

## 2023-05-20 NOTE — Progress Notes (Signed)
[facility] Progress Note   Assessment/ Plan:   Samantha [name] is a/an 72 y.o. female with a past medical history significant for HTN, DM2, PAD, CVA, HLD, CKD 3B who presents with shortness of breath and concern for volume overload   The patient was recently hospitalized prior to this hospitalization for E. coli UTI and pyelonephritis.  She also was taking an ARB as well as NSAIDs at home.  She had an AKI and was given IV fluids.  She developed some volume overload and IV fluids were stopped.  Her creatinine was 2.9 at discharge.  When she read presented for this hospitalization her creatinine was 2.4.  She was taking Lasix 40 mg daily but had ran out a couple days before arriving.       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 Unfortunately it appears that the patient has progressed to ESRD. Will also check a 24hr urine urea tues - wed to determine residual renal function to see if she would benefit from twice weekly hd to preserve residual function.  -Temporary catheter placed by CCM on 5/11, appreciate help - looks like she is improving with UOP-- continue Lasix 80 IV BID for now - adding fluid restriction -Continue to monitor daily Cr, Dose meds for GFR -Monitor Daily I/Os, Daily weight  -Maintain MAP>65 for optimal renal perfusion.  -Avoid nephrotoxic medications including NSAIDs -Use synthetic opioids (Fentanyl/Dilaudid) if needed -Serologies have been sent to workup for possible glomerular disease but seems unlikely given no significant hematuria.  Proteinuria is most likely related to her history of diabetes and recent tubular injury. - Anticipated her labs getting better- but they did not and continued to trend in the wrong direction, oliguric as well leading to HD. - HD #1 05/12/23 - HD #2 05/14/23 - didn't do so well on Lasix challenge - HD #3 5/20 (2L) ->  plan #4 but she is not feeling well today. Let's hold on HD an plan on either Th or Fri. CLIP in process (pending)  Plan on Th for only 3 hrs ; will see if shorter treatment and lower flows will help. Using a 4K bath; also repleted her K on 5/22.  In future as outpt can consider HT PD, explained to family [name] and son who wants to be an anesthesiologist).  24hr study not helpful as no urea sent (lab orders still say active).  - Appreciate IR exchanging for a RIJ 19cm CTT TC on 5/20 - Appreciate renal navigator working on placement. Cr was 1.7 in 2022 but had been in the 2.4-3 range recently and no signs of renal recovery.    2) Acute hypoxic respiratory failure.  Most likely related to volume overload.  Appreciate help from pulmonary critical care team.  Seems to be improving after thoracentesis and diuresis.  Continue with diuresis as above   3) Volume overload: Mostly as above.  Of note, ejection fraction is normal.  Echocardiogram did demonstrate that IVC was distended.  She seems to be improving with diuresis as well as thoracentesis.  Continue diuretics as above   4) Uncontrolled Diabetes Mellitus Type 2 with Hyperglycemia: Management per primary team   5) HTN: Medications per primary on carvedilol   6) Metabolic acidosis: Resolved.   7) Abdominal distention/fullness:  Improving with Bms and dialysis  Subjective:    Seen in room.  Feeling a little  better today c/w yesterday but still has abd pain (left midline) and per discussion with Dr. Jerral Ralph she had fevers overnight.    Objective:   BP (!) 151/74 (BP Location: Left Arm)   Pulse 79   Temp 97.8 F (36.6 C) (Oral)   Resp 12   Ht 5\' 5"  (1.651 m)   Wt 64.5 kg   SpO2 99%   BMI 23.66 kg/m   Intake/Output Summary (Last 24 hours) at 05/20/2023 1610 Last data filed at 05/19/2023 2120 Gross per 24 hour  Intake 490 ml  Output 200 ml  Net 290 ml    Weight change:   Physical Exam: Gen:NAD CVS:RRR Resp: normal  WOB Abd: soft, but mildly tender left midline (no rebound) Ext: no LE edema ACCESS: R IJ tunneled HD catheter  Imaging: No results found.  Labs: BMET Recent Labs  Lab 05/14/23 0840 05/15/23 0337 05/16/23 0325 05/17/23 0433 05/18/23 0535 05/19/23 0355 05/20/23 0422  NA 125* 130* 128* 131* 132* 128* 130*  K 3.4* 3.3* 3.2* 3.2* 3.7 3.1* 4.0  CL 86* 92* 91* 94* 94* 92* 92*  CO2 25 26 23 23 24 23 23   GLUCOSE 243* 135* 115* 137* 134* 141* 209*  BUN 51* 30* 42* 53* 28* 38* 46*  CREATININE 3.65* 3.06* 3.75* 3.88* 3.69* 4.49* 4.90*  CALCIUM 8.9 9.1 9.1 9.2 9.6 8.9 9.4  PHOS 5.4* 4.8* 5.2* 5.5* 5.3* 5.0* 5.4*   CBC Recent Labs  Lab 05/14/23 0840 05/16/23 0325 05/19/23 1132  WBC 8.0 9.0 12.8*  HGB 10.6* 11.0* 10.9*  HCT 30.5* 32.0* 32.0*  MCV 89.7 91.4 90.9  PLT 321 319 291    Medications:     atorvastatin  80 mg Oral Daily   bisacodyl  10 mg Rectal Daily   carvedilol  6.25 mg Oral BID WC   Chlorhexidine Gluconate Cloth  6 each Topical Q0600   Chlorhexidine Gluconate Cloth  6 each Topical Q0600   Chlorhexidine Gluconate Cloth  6 each Topical Q0600   clopidogrel  75 mg Oral Daily   diphenhydrAMINE  25 mg Intravenous Once   docusate sodium  100 mg Oral BID   fluticasone  2 spray Each Nare Daily   furosemide  80 mg Intravenous BID   guaiFENesin  600 mg Oral BID   heparin  5,000 Units Subcutaneous Q8H   insulin aspart  0-15 Units Subcutaneous TID WC   insulin aspart  0-5 Units Subcutaneous QHS   insulin aspart  3 Units Subcutaneous TID WC   insulin glargine-yfgn  15 Units Subcutaneous Daily   oxymetazoline  1 spray Each Nare Once   sodium chloride flush  3 mL Intravenous Q12H

## 2023-05-21 ENCOUNTER — Inpatient Hospital Stay (HOSPITAL_COMMUNITY): Payer: Medicaid Other

## 2023-05-21 LAB — COMPREHENSIVE METABOLIC PANEL
ALT: 18 U/L (ref 0–44)
AST: 39 U/L (ref 15–41)
Albumin: 3 g/dL — ABNORMAL LOW (ref 3.5–5.0)
Alkaline Phosphatase: 91 U/L (ref 38–126)
Anion gap: 14 (ref 5–15)
BUN: 31 mg/dL — ABNORMAL HIGH (ref 8–23)
CO2: 24 mmol/L (ref 22–32)
Calcium: 9.1 mg/dL (ref 8.9–10.3)
Chloride: 93 mmol/L — ABNORMAL LOW (ref 98–111)
Creatinine, Ser: 4.14 mg/dL — ABNORMAL HIGH (ref 0.44–1.00)
GFR, Estimated: 11 mL/min — ABNORMAL LOW (ref >60)
Glucose, Bld: 156 mg/dL — ABNORMAL HIGH (ref 70–99)
Potassium: 3.9 mmol/L (ref 3.5–5.1)
Sodium: 131 mmol/L — ABNORMAL LOW (ref 135–145)
Total Bilirubin: 1.1 mg/dL (ref 0.3–1.2)
Total Protein: 7.1 g/dL (ref 6.5–8.1)

## 2023-05-21 LAB — GLUCOSE, CAPILLARY
Glucose-Capillary: 179 mg/dL — ABNORMAL HIGH (ref 70–99)
Glucose-Capillary: 156 mg/dL — ABNORMAL HIGH (ref 70–99)
Glucose-Capillary: 177 mg/dL — ABNORMAL HIGH (ref 70–99)
Glucose-Capillary: 176 mg/dL — ABNORMAL HIGH (ref 70–99)

## 2023-05-21 LAB — CBC
HCT: 30 % — ABNORMAL LOW (ref 36.0–46.0)
Hemoglobin: 10.5 g/dL — ABNORMAL LOW (ref 12.0–15.0)
MCH: 31.9 pg (ref 26.0–34.0)
MCHC: 35 g/dL (ref 30.0–36.0)
MCV: 91.2 fL (ref 80.0–100.0)
Platelets: 272 10*3/uL (ref 150–400)
RBC: 3.29 MIL/uL — ABNORMAL LOW (ref 3.87–5.11)
RDW: 13.1 % (ref 11.5–15.5)
WBC: 20.5 10*3/uL — ABNORMAL HIGH (ref 4.0–10.5)
nRBC: 0 % (ref 0.0–0.2)

## 2023-05-21 LAB — PHOSPHORUS: Phosphorus: 5.3 mg/dL — ABNORMAL HIGH (ref 2.5–4.6)

## 2023-05-21 MED ORDER — PIPERACILLIN-TAZOBACTAM IN DEX 2-0.25 GM/50ML IV SOLN
2.2500 g | Freq: Three times a day (TID) | INTRAVENOUS | Status: DC
  Administered 2023-05-21 – 2023-05-25 (×10): 2.25 g via INTRAVENOUS
  Filled 2023-05-21 (×14): qty 50

## 2023-05-21 MED ORDER — CHLORHEXIDINE GLUCONATE CLOTH 2 % EX PADS
6.0000 | MEDICATED_PAD | Freq: Every day | CUTANEOUS | Status: DC
  Administered 2023-05-22 – 2023-05-24 (×3): 6 via TOPICAL

## 2023-05-21 MED ORDER — TECHNETIUM TC 99M MEBROFENIN IV KIT
5.0000 | PACK | Freq: Once | INTRAVENOUS | Status: AC | PRN
  Administered 2023-05-21: 5 via INTRAVENOUS

## 2023-05-21 MED ORDER — HYDROMORPHONE HCL 1 MG/ML IJ SOLN
0.5000 mg | INTRAMUSCULAR | Status: DC | PRN
  Administered 2023-05-21 – 2023-05-23 (×7): 0.5 mg via INTRAVENOUS
  Filled 2023-05-21 (×7): qty 0.5

## 2023-05-21 NOTE — Progress Notes (Signed)
CSW sent in application for [facility] to assist with transportation to dialysis. Contact info on AVS for patient to follow up.  Joaquin Courts, MSW, [facility]

## 2023-05-21 NOTE — Evaluation (Signed)
Physical Therapy Evaluation Patient Details Name: Samantha [name] MRN: [id number] DOB: Jun 10, 1951 Today's Date: 05/21/2023  History of Present Illness  72 y.o. female who presented on 5/8 with acute hypoxic respiratory failure in the setting of pulm edema with worsening renal function. CT of chest found new bilateral pleural effusions, right> left parenchymal opacities. 5/11 thoracocentesis. PMHx: HTN, HLD, PAD, DM2, CVA, depression/anxiety.  Clinical Impression  Pt presents today with impaired functional mobility, limited by pain in her RUQ, decreased strength and balance, and poor activity tolerance. Pt was independent with all mobility prior to admission but has become deconditioned during her admission, with her daughter reporting pt requiring assistance during admission. Pt limited by pain throughout today's session, requiring modA for bed mobility and standing at EOB, with increased time and rest breaks throughout due to pain, encouraged deep breathing through the pain. Pt has full time care at home, with family requesting pt return there upon discharge. Pt will continue to benefit from skilled acute PT to progress mobility and address deficits, recommend HHPT upon discharge pending family support and progression during admission. Anticipate with improved pain management, mobility will progress. Acute PT will continue to follow during admission as appropriate.        Recommendations for follow up therapy are one component of a multi-disciplinary discharge planning process, led by the attending physician.  Recommendations may be updated based on patient status, additional functional criteria and insurance authorization.  Follow Up Recommendations       Assistance Recommended at Discharge Frequent or constant Supervision/Assistance  Patient can return home with the following  A lot of help with walking and/or transfers;A little help with bathing/dressing/bathroom;Assistance with  cooking/housework;Help with stairs or ramp for entrance;Assist for transportation    Equipment Recommendations None recommended by PT  Recommendations for Other Services       Functional Status Assessment Patient has had a recent decline in their functional status and demonstrates the ability to make significant improvements in function in a reasonable and predictable amount of time.     Precautions / Restrictions Precautions Precautions: Fall Restrictions Weight Bearing Restrictions: No      Mobility  Bed Mobility Overal bed mobility: Needs Assistance Bed Mobility: Supine to Sit, Sit to Supine     Supine to sit: Mod assist, HOB elevated Sit to supine: Mod assist, HOB elevated   General bed mobility comments: assist for trunk support into sitting and BLE management for return to supine    Transfers Overall transfer level: Needs assistance Equipment used: 1 person hand held assist Transfers: Sit to/from Stand Sit to Stand: Mod assist           General transfer comment: modA to power up and steady, initial posterior lean. First attempt with minimal effort from pt, improving with second attempt and 2HHA.    Ambulation/Gait               General Gait Details: pt declined, only tolerated standing ~15 seconds before requesting to return to sitting  Stairs            Wheelchair Mobility    Modified Rankin (Stroke Patients Only)       Balance Overall balance assessment: Needs assistance Sitting-balance support: Bilateral upper extremity supported, Feet supported Sitting balance-Leahy Scale: Fair   Postural control: Posterior lean Standing balance support: Bilateral upper extremity supported Standing balance-Leahy Scale: Poor Standing balance comment: requiring physical assist and BUE support for static standing  Pertinent Vitals/Pain Pain Assessment Pain Assessment: No/denies pain    Home Living  Family/patient expects to be discharged to:: Private residence Living Arrangements: Children Available Help at Discharge: Family;Available 24 hours/day Type of Home: House Home Access: Stairs to enter Entrance Stairs-Rails: Left Entrance Stairs-Number of Steps: 3   Home Layout: One level Home Equipment: Agricultural consultant (2 wheels);BSC/3in1 Additional Comments: lives with daughter    Prior Function Prior Level of Function : Independent/Modified Independent             Mobility Comments: independent without AD, no falls ADLs Comments: independent     Hand Dominance   Dominant Hand: Right    Extremity/Trunk Assessment   Upper Extremity Assessment Upper Extremity Assessment: Generalized weakness    Lower Extremity Assessment Lower Extremity Assessment: Generalized weakness    Cervical / Trunk Assessment Cervical / Trunk Assessment: Normal  Communication   Communication: Prefers language other than English (daughter present and providing interpretation)  Cognition Arousal/Alertness: Awake/alert Behavior During Therapy: Flat affect Overall Cognitive Status: Within Functional Limits for tasks assessed                                 General Comments: limited by pain throughout session and requires increased time and motivation but able to follow commands through daughter's interpretation        General Comments General comments (skin integrity, edema, etc.): VSS on room air, daughter present throughout session and preferring to provide interpretation    Exercises     Assessment/Plan    PT Assessment Patient needs continued PT services  PT Problem List Decreased strength;Decreased activity tolerance;Decreased balance;Decreased mobility;Pain;Decreased knowledge of use of DME       PT Treatment Interventions DME instruction;Gait training;Stair training;Functional mobility training;Therapeutic activities;Therapeutic exercise;Balance  training;Neuromuscular re-education;Patient/family education    PT Goals (Current goals can be found in the Care Plan section)  Acute Rehab PT Goals Patient Stated Goal: relieve pain and go home PT Goal Formulation: With patient/family Time For Goal Achievement: 06/04/23 Potential to Achieve Goals: Fair    Frequency Min 3X/week     Co-evaluation               AM-PAC PT "6 Clicks" Mobility  Outcome Measure Help needed turning from your back to your side while in a flat bed without using bedrails?: A Little Help needed moving from lying on your back to sitting on the side of a flat bed without using bedrails?: A Lot Help needed moving to and from a bed to a chair (including a wheelchair)?: A Lot Help needed standing up from a chair using your arms (e.g., wheelchair or bedside chair)?: A Lot Help needed to walk in hospital room?: Total Help needed climbing 3-5 steps with a railing? : Total 6 Click Score: 11    End of Session Equipment Utilized During Treatment: Gait belt Activity Tolerance: Patient limited by pain Patient left: in bed;with call bell/phone within reach;with bed alarm set;with family/visitor present Nurse Communication: Mobility status PT Visit Diagnosis: Muscle weakness (generalized) (M62.81);Difficulty in walking, not elsewhere classified (R26.2);Other abnormalities of gait and mobility (R26.89);Pain Pain - Right/Left: Right Pain - part of body:  (RUQ)    Time: 1610-9604 PT Time Calculation (min) (ACUTE ONLY): 27 min   Charges:   PT Evaluation $PT Eval Moderate Complexity: 1 Mod          Lindalou Hose, PT DPT Acute Rehabilitation Services Office [phone]  Leonie Man 05/21/2023, 3:31 PM

## 2023-05-21 NOTE — Progress Notes (Addendum)
Update provided to renal NP regarding pt's out-pt HD clinic arrangements in the event pt is stable for d/c over the weekend/Monday and orders need to be sent to clinic at d/c. Clinic can start pt Tuesday if pt d/c over the weekend. Will assist as needed.   Olivia Canter Renal Navigator [phone]

## 2023-05-21 NOTE — Progress Notes (Signed)
Pharmacy Antibiotic Note  Samantha [name] is a 72 y.o. female admitted on 05/05/2023 with  IAI .  Pharmacy has been consulted for zosyn dosing.  Plan: Zosyn 2.25 grams iv q8h  Height: 5\' 5"  (165.1 cm) Weight: 62.5 kg (137 lb 12.6 oz) IBW/kg (Calculated) : 57  Temp (24hrs), Avg:98.6 F (37 C), Min:98.2 F (36.8 C), Max:99.2 F (37.3 C)  Recent Labs  Lab 05/16/23 0325 05/17/23 0433 05/18/23 0535 05/19/23 0355 05/19/23 1132 05/20/23 0422 05/21/23 0405  WBC 9.0  --   --   --  12.8*  --  20.5*  CREATININE 3.75* 3.88* 3.69* 4.49*  --  4.90* 4.14*    Estimated Creatinine Clearance: 11.5 mL/min (A) (by C-G formula based on SCr of 4.14 mg/dL (H)).    Allergies  Allergen Reactions   Chicken Allergy Other (See Comments)    headache   Perflutren Other (See Comments)    Pt with back pain as soon as administered.- patient refuted this in 2024    Antimicrobials this admission: zosyn 5/24 >>     Dose adjustments this admission: Zosyn for HD (TTS)    Thank you for allowing pharmacy to be a part of this patient's care.  Greta Doom BS, PharmD, BCPS Clinical Pharmacist 05/21/2023 5:41 PM  Contact: [phone] after 3 PM  "Be curious, not judgmental..." -Debbora Dus

## 2023-05-21 NOTE — Consult Note (Signed)
**Note Samantha-Identified via Obfuscation** Reason for Consult:abdominal pain Referring Provider: Jeoffrey Massed  Samantha [name] is an 72 y.o. female.  HPI: She presented 5/8 with CHF exacerbation. During hospitalization she has gone into renal failure and has started on HD. She has had an uptrending white count and concern for worsening abdominal pain.  She complains of RUQ pain. Pain has been present for years. It is worse with certain arm movements and can feel tingling. It is associated with nausea but not certain foods.  Past Medical History:  Diagnosis Date   Acute pyelonephritis    Bacteremia, escherichia coli 01/12/2015   Chest pain    Critical lower limb ischemia (HCC)    Hyperlipidemia    Hypertension    Left carotid bruit    PAD (peripheral artery disease) (HCC)    Type II diabetes mellitus (HCC)     Past Surgical History:  Procedure Laterality Date   ABDOMINAL ANGIOGRAM  05/16/2015   Procedure: Abdominal Angiogram;  Surgeon: Runell Gess, MD;  Location: [facility] INVASIVE CV LAB;  Service: Cardiovascular;;   ABDOMINAL HYSTERECTOMY  ~ 2000   IR FLUORO GUIDE CV LINE RIGHT  05/17/2023   IR US GUIDE VASC ACCESS RIGHT  05/17/2023   PERIPHERAL VASCULAR CATHETERIZATION Bilateral 05/16/2015   Procedure: Lower Extremity Angiography;  Surgeon: Runell Gess, MD; renal arteries widely patent, L-SFA 75%, L-pop 95%, L-peroneal 90%; R-SFA 40%, R-pop 50%, R-prox peroneal 90%; directional atherectomy L-SFA, p-pop, drug-eluting balloon angioplasty reducing the stenoses to 0, small linear dissection was not flow limiting       PERIPHERAL VASCULAR CATHETERIZATION Left 05/16/2015   Procedure: Peripheral Vascular Atherectomy;  Surgeon: Runell Gess, MD;  Location: [facility] INVASIVE CV LAB;  Service: Cardiovascular;  Laterality: Left;  sfa    Family History  Problem Relation Age of Onset   Hypertension Mother     Social History:  reports that she has never smoked. She has never used smokeless tobacco. She reports current alcohol  use of about 1.0 - 2.0 standard drink of alcohol per week. She reports that she does not use drugs.  Allergies:  Allergies  Allergen Reactions   Chicken Allergy Other (See Comments)    headache   Perflutren Other (See Comments)    Pt with back pain as soon as administered.- patient refuted this in 2024    Medications: I have reviewed the patient's current medications.  Results for orders placed or performed during the hospital encounter of 05/05/23 (from the past 48 hour(s))  Glucose, capillary     Status: None   Collection Time: 05/19/23  8:06 PM  Result Value Ref Range   Glucose-Capillary 95 70 - 99 mg/dL    Comment: Glucose reference range applies only to samples taken after fasting for at least 8 hours.  Renal function panel     Status: Abnormal   Collection Time: 05/20/23  4:22 AM  Result Value Ref Range   Sodium 130 (L) 135 - 145 mmol/L   Potassium 4.0 3.5 - 5.1 mmol/L   Chloride 92 (L) 98 - 111 mmol/L   CO2 23 22 - 32 mmol/L   Glucose, Bld 209 (H) 70 - 99 mg/dL    Comment: Glucose reference range applies only to samples taken after fasting for at least 8 hours.   BUN 46 (H) 8 - 23 mg/dL   Creatinine, Ser 1.61 (H) 0.44 - 1.00 mg/dL   Calcium 9.4 8.9 - 09.6 mg/dL   Phosphorus 5.4 (H) 2.5 - 4.6 mg/dL  Albumin 3.2 (L) 3.5 - 5.0 g/dL   GFR, Estimated 9 (L) >60 mL/min    Comment: (NOTE) Calculated using the CKD-EPI Creatinine Equation (2021)    Anion gap 15 5 - 15    Comment: Performed at [facility], [address], [place], [place] [address]  Glucose, capillary     Status: Abnormal   Collection Time: 05/20/23 12:27 PM  Result Value Ref Range   Glucose-Capillary 152 (H) 70 - 99 mg/dL    Comment: Glucose reference range applies only to samples taken after fasting for at least 8 hours.  Hepatitis B surface antigen     Status: None   Collection Time: 05/20/23  1:06 PM  Result Value Ref Range   Hepatitis B Surface Ag NON REACTIVE NON REACTIVE    Comment:  Performed at [facility], [address], [place], [place] [address]  Hepatitis B surface antibody,qualitative     Status: None   Collection Time: 05/20/23  1:06 PM  Result Value Ref Range   Hep B S Ab NON REACTIVE NON REACTIVE    Comment: (NOTE) Inconsistent with immunity, less than 10 mIU/mL.  Performed at [facility], [address], [place], [place] [address]   Hepatitis B core antibody, total     Status: None   Collection Time: 05/20/23  1:06 PM  Result Value Ref Range   Hep B Core Total Ab NON REACTIVE NON REACTIVE    Comment: Performed at [facility], [address] [place], [place] [address]  Glucose, capillary     Status: Abnormal   Collection Time: 05/20/23  4:15 PM  Result Value Ref Range   Glucose-Capillary 187 (H) 70 - 99 mg/dL    Comment: Glucose reference range applies only to samples taken after fasting for at least 8 hours.   Comment 1 Notify RN   Glucose, capillary     Status: Abnormal   Collection Time: 05/20/23  9:16 PM  Result Value Ref Range   Glucose-Capillary 130 (H) 70 - 99 mg/dL    Comment: Glucose reference range applies only to samples taken after fasting for at least 8 hours.  CBC     Status: Abnormal   Collection Time: 05/21/23  4:05 AM  Result Value Ref Range   WBC 20.5 (H) 4.0 - 10.5 K/uL   RBC 3.29 (L) 3.87 - 5.11 MIL/uL   Hemoglobin 10.5 (L) 12.0 - 15.0 g/dL   HCT 29.5 (L) 18.8 - 41.6 %   MCV 91.2 80.0 - 100.0 fL   MCH 31.9 26.0 - 34.0 pg   MCHC 35.0 30.0 - 36.0 g/dL   RDW 60.6 30.1 - 60.1 %   Platelets 272 150 - 400 K/uL   nRBC 0.0 0.0 - 0.2 %    Comment: Performed at [facility], [address], [place], [place] [address]  Comprehensive metabolic panel     Status: Abnormal   Collection Time: 05/21/23  4:05 AM  Result Value Ref Range   Sodium 131 (L) 135 - 145 mmol/L   Potassium 3.9 3.5 - 5.1 mmol/L   Chloride 93 (L) 98 - 111 mmol/L   CO2 24 22 - 32 mmol/L   Glucose, Bld 156 (H) 70 - 99 mg/dL    Comment:  Glucose reference range applies only to samples taken after fasting for at least 8 hours.   BUN 31 (H) 8 - 23 mg/dL   Creatinine, Ser 5.57 (H) 0.44 -  1.00 mg/dL   Calcium 9.1 8.9 - 16.1 mg/dL   Total Protein 7.1 6.5 - 8.1 g/dL   Albumin 3.0 (L) 3.5 - 5.0 g/dL   AST 39 15 - 41 U/L   ALT 18 0 - 44 U/L   Alkaline Phosphatase 91 38 - 126 U/L   Total Bilirubin 1.1 0.3 - 1.2 mg/dL   GFR, Estimated 11 (L) >60 mL/min    Comment: (NOTE) Calculated using the CKD-EPI Creatinine Equation (2021)    Anion gap 14 5 - 15    Comment: Performed at [facility], [address] [place], [place] [address]  Phosphorus     Status: Abnormal   Collection Time: 05/21/23  4:05 AM  Result Value Ref Range   Phosphorus 5.3 (H) 2.5 - 4.6 mg/dL    Comment: Performed at [facility], [address] [place], [place] [address]  Glucose, capillary     Status: Abnormal   Collection Time: 05/21/23  7:49 AM  Result Value Ref Range   Glucose-Capillary 179 (H) 70 - 99 mg/dL    Comment: Glucose reference range applies only to samples taken after fasting for at least 8 hours.   Comment 1 Notify RN   Glucose, capillary     Status: Abnormal   Collection Time: 05/21/23 11:31 AM  Result Value Ref Range   Glucose-Capillary 156 (H) 70 - 99 mg/dL    Comment: Glucose reference range applies only to samples taken after fasting for at least 8 hours.   Comment 1 Notify RN   Glucose, capillary     Status: Abnormal   Collection Time: 05/21/23  5:18 PM  Result Value Ref Range   Glucose-Capillary 177 (H) 70 - 99 mg/dL    Comment: Glucose reference range applies only to samples taken after fasting for at least 8 hours.   Comment 1 Notify RN     NM Hepatobiliary Liver Func  Result Date: 05/21/2023 CLINICAL DATA:  Gallstones. EXAM: NUCLEAR MEDICINE HEPATOBILIARY IMAGING TECHNIQUE: Sequential images of the abdomen were obtained out to 60 minutes following intravenous administration of radiopharmaceutical.  RADIOPHARMACEUTICALS:  5.0 mCi Tc-55m  Choletec IV COMPARISON:  CT 05/05/2023 FINDINGS: Prompt uptake and biliary excretion of activity by the liver is seen. Biliary activity passes into small bowel, consistent with patent common bile duct. No gallbladder activity identified after 120 minutes of sequential imaging. IMPRESSION: 1. Nonvisualization of the gallbladder after 120 minutes of imaging compatible with cholecystitis. 2. Normal biliary to bowel transit consistent with patent common bile duct. Electronically Signed   By: Signa Kell M.D.   On: 05/21/2023 17:23    ROS  PE Blood pressure 122/68, pulse 76, temperature 99.2 F (37.3 C), temperature source Oral, resp. rate 16, height 5\' 5"  (1.651 m), weight 62.5 kg, SpO2 92 %. Constitutional: NAD; conversant; no deformities Eyes: Moist conjunctiva; no lid lag; anicteric; PERRL Neck: Trachea midline; no thyromegaly Lungs: Normal respiratory effort; no tactile fremitus CV: RRR; no palpable thrills; no pitting edema GI: Abd soft, TTP RUQ; no palpable hepatosplenomegaly MSK: unable to gait; no clubbing/cyanosis Psychiatric: Appropriate affect; alert and oriented x3 Lymphatic: No palpable cervical or axillary lymphadenopathy Skin: No major subcutaneous nodules. Warm and dry   Assessment/Plan: 72 yo female with multiple medical problems with heart and kidney exacerbations during this hospitalization with WBC of 20, stones seen on [place] 5/8 and HIDA scan showing nonfilling. I reviewed [place], HIDA, and CT images. CT scan showed a above average size, dilated gallbladder. -  agree with antibiotics -hold plavix -plan for cholecystostomy tube during hospitalization  I reviewed last 24 h vitals and pain scores, last 48 h intake and output, last 24 h labs and trends, and last 24 h imaging results.  This care required high  level of medical decision making.   Samantha [name] 05/21/2023, 7:41 PM

## 2023-05-21 NOTE — Progress Notes (Signed)
[facility] PROGRESS NOTE  Assessment/ Plan: Pt is a 72 y.o. yo female with a past medical history significant for hypertension, type II DM, PAD, CVA, HLD, CKD 3B who presented with shortness of breath and fluid overload.  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  OP HD arranged at [facility] on TTS 11:30 chair time.  Plan for PD as outpatient. TTS schedule, last HD on 5/23 with 1 L UF.  Plan for next dialysis tomorrow.    # Acute hypoxic respiratory failure due to fluid overload: Received thoracocentesis and diuresis.  Now managing volume with HD.  # HTN/volume: BP acceptable.  No edema.  UF as tolerated.  Discontinue Lasix.  # Anemia: Hemoglobin at goal.  # Abdominal pain: Noted HIDA scan was ordered by primary team.  Continue supportive care.  Subjective: Seen and examined at bedside.  Patient reported abdominal pain and decreased appetite.  Her daughter was at bedside.  Denies chest pain or shortness of breath.  No peripheral edema. Objective Vital signs in last 24 hours: Vitals:   05/21/23 0401 05/21/23 0500 05/21/23 0752 05/21/23 1133  BP: (!) 114/57  128/70 132/72  Pulse: 82     Resp: 19     Temp: 98.2 F (36.8 C)  98.3 F (36.8 C)   TempSrc: Oral  Oral Oral  SpO2: 90%     Weight:  62.5 kg    Height:       Weight change:   Intake/Output Summary (Last 24 hours) at 05/21/2023 1139 Last data filed at 05/20/2023 1152 Gross per 24 hour  Intake --  Output 1000 ml  Net -1000 ml       Labs: RENAL PANEL Recent Labs  Lab 05/17/23 0433 05/18/23 0535 05/19/23 0355 05/20/23 0422 05/21/23 0405  NA 131* 132* 128* 130* 131*  K 3.2* 3.7 3.1* 4.0 3.9  CL 94* 94* 92* 92* 93*  CO2 23 24 23 23 24   GLUCOSE 137* 134* 141* 209* 156*  BUN 53* 28* 38* 46* 31*  CREATININE 3.88* 3.69* 4.49* 4.90* 4.14*  CALCIUM 9.2 9.6 8.9 9.4  9.1  PHOS 5.5* 5.3* 5.0* 5.4* 5.3*  ALBUMIN 3.2* 3.4* 3.2* 3.2* 3.0*    Liver Function Tests: Recent Labs  Lab 05/19/23 0355 05/20/23 0422 05/21/23 0405  AST  --   --  39  ALT  --   --  18  ALKPHOS  --   --  91  BILITOT  --   --  1.1  PROT  --   --  7.1  ALBUMIN 3.2* 3.2* 3.0*   No results for input(s): "LIPASE", "AMYLASE" in the last 168 hours. No results for input(s): "AMMONIA" in the last 168 hours. CBC: Recent Labs    05/12/23 1435 05/14/23 0840 05/16/23 0325 05/19/23 1132 05/21/23 0405  HGB 11.1* 10.6* 11.0* 10.9* 10.5*  MCV 88.5 89.7 91.4 90.9 91.2    Cardiac Enzymes: No results for input(s): "CKTOTAL", "CKMB", "CKMBINDEX", "TROPONINI" in the last 168 hours. CBG: Recent Labs  Lab 05/20/23 1227 05/20/23 1615 05/20/23 2116 05/21/23 0749 05/21/23 1131  GLUCAP 152* 187* 130* 179* 156*    Iron Studies: No results for input(s): "IRON", "TIBC", "TRANSFERRIN", "FERRITIN" in the last 72 hours. Studies/Results: No results found.  Medications: Infusions:  sodium chloride Stopped (05/09/23 2241)    Scheduled Medications:  atorvastatin  80 mg Oral  Daily   bisacodyl  10 mg Rectal Daily   carvedilol  6.25 mg Oral BID WC   Chlorhexidine Gluconate Cloth  6 each Topical Q0600   Chlorhexidine Gluconate Cloth  6 each Topical Q0600   Chlorhexidine Gluconate Cloth  6 each Topical Q0600   clopidogrel  75 mg Oral Daily   diphenhydrAMINE  25 mg Intravenous Once   docusate sodium  100 mg Oral BID   fluticasone  2 spray Each Nare Daily   furosemide  80 mg Intravenous BID   guaiFENesin  600 mg Oral BID   heparin  5,000 Units Subcutaneous Q8H   insulin aspart  0-15 Units Subcutaneous TID WC   insulin aspart  0-5 Units Subcutaneous QHS   insulin aspart  3 Units Subcutaneous TID WC   insulin glargine-yfgn  15 Units Subcutaneous Daily   oxymetazoline  1 spray Each Nare Once   sodium chloride flush  3 mL Intravenous Q12H    have reviewed scheduled and prn  medications.  Physical Exam: General:NAD, comfortable Heart:RRR, s1s2 nl Lungs:clear b/l, no crackle Abdomen:soft, Non-tender, non-distended Extremities:No edema Dialysis Access: Dialysis HD catheter in place.  Brittanny Levenhagen Prasad Cylus [place] 05/21/2023,11:39 AM  LOS: 16 days

## 2023-05-21 NOTE — Progress Notes (Addendum)
PROGRESS NOTE        PATIENT DETAILS Name: Samantha [name] Age: 72 y.o. Sex: female Date of Birth: May 03, 1951 Admit Date: 05/05/2023 Admitting Physician Meredeth Ide, MD ZOX:WRUEAV, Odette Horns, MD  Brief Summary: Patient is a 72 y.o.  female with history of HTN, DM-2, PAD, CVA, depression/anxiety-who presented with acute hypoxic respiratory failure in the setting of pulm edema with worsening renal function.  Significant events: 5/8>> admit to [facility]  Significant studies: 5/ 8>> CT chest/abdomen/pelvis: New bilateral pleural effusions, right> left parenchymal opacities 5/8>> renal ultrasound: No hydronephrosis 5/9??  Echo: EF 55-60% 5/12>> CT chest: Increasing interstitial/patchy groundglass opacities-decreasing small bilateral pleural effusions. 5/14>> gastric emptying study: Nondiagnostic due to emesis  Significant microbiology data: 5/8>> COVID PCR: Negative 5/8>> blood culture: Negative 5/11>> pleural fluid culture: No growth 5/22>> blood culture: No growth  Procedures: 5/11>> thoracocentesis (pleural fluid cytology negative) 5/11>> nontunneled dialysis catheter by PCCM 5/20>> tunneled dialysis catheter by IR   Consults: PCCM Nephrology Interventional radiology  Subjective: Pointing to her right upper quadrant today claims this has been hurting.  Apparently this has been a chronic issue for the past several years-she also has pain in her neck, upper back, right thigh area.  Objective: Vitals: Blood pressure 132/72, pulse 82, temperature 98.3 F (36.8 C), temperature source Oral, resp. rate 19, height 5\' 5"  (1.651 m), weight 62.5 kg, SpO2 90 %.   Exam: Gen Exam:Alert awake-not in any distress HEENT:atraumatic, normocephalic Chest: B/L clear to auscultation anteriorly CVS:S1S2 regular Abdomen: Soft-has some RUQ tenderness today-however some mild tenderness in the left mid abdominal area as well. Extremities:no edema Neurology: Non  focal Skin: no rash  Pertinent Labs/Radiology:    Latest Ref Rng & Units 05/21/2023    4:05 AM 05/19/2023   11:32 AM 05/16/2023    3:25 AM  CBC  WBC 4.0 - 10.5 K/uL 20.5  12.8  9.0   Hemoglobin 12.0 - 15.0 g/dL 40.9  81.1  91.4   Hematocrit 36.0 - 46.0 % 30.0  32.0  32.0   Platelets 150 - 400 K/uL 272  291  319     Lab Results  Component Value Date   NA 131 (L) 05/21/2023   K 3.9 05/21/2023   CL 93 (L) 05/21/2023   CO2 24 05/21/2023      Assessment/Plan: AKI on CKD stage IIIb Likely hemodynamically mediated-suspicion that she may have progressed to ESRD Nephrology following and directing HD care/scheduled Continue to monitor for signs of renal recovery  Acute hypoxic respiratory failure Secondary to acute pulm edema in the setting of worsening renal function/pleural effusion.  (PNA ruled out) Volume being managed with HD-dose IV Lasix Currently improved-and on room air  Low-grade fever with chills on 5/22 Worsening RUQ pain on 5/24 Significant leukocytosis today She is extremely poor historian-acknowledges pain in multiple areas but from what I can gather-her RUQ pain is much worse today (apparently has been going on for several years) She does have some tenderness in her RUQ area on exam Given worsening leukocytosis-HIDA scan ordered.  If positive-CCS consult, if negative-repeat CT imaging.  Addendum +ve HIDA-CCS consulted-Zosyn started  Hyponatremia Due to impaired water excretion in the setting of worsening renal function Improving with ongoing volume removal with hemodialysis.  Hypokalemia Repleted  Acute on chronic HFrEF Acute pulm edema Volume status stable with HD/IV Lasix  History of CAD  No anginal symptoms Echo with stable EF On Plavix/beta-blocker/statin  HTN BP stable on Coreg Follow/optimize  HLD Statin  DM-2 (A1c 7.7 on 4/18) CBGs stable on 15 units of Semglee daily, 3 units of NovoLog with meals and SSI  Recent Labs    05/20/23 2116  05/21/23 0749 05/21/23 1131  GLUCAP 130* 179* 156*     Subcentimeter left thyroid nodule Per radiology-not clinically significant-no follow-up recommendations.  BMI: Estimated body mass index is 22.93 kg/m as calculated from the following:   Height as of this encounter: 5\' 5"  (1.651 m).   Weight as of this encounter: 62.5 kg.   Code status:   Code Status: Full Code   DVT Prophylaxis: heparin injection 5,000 Units Start: 05/09/23 0600   Family Communication: Family member at bedside.   Disposition Plan: Status is: Inpatient Remains inpatient appropriate because: Severity of illness   Planned Discharge Destination:Home   Diet: Diet Order             Diet NPO time specified  Diet effective now                     Antimicrobial agents: Anti-infectives (From admission, onward)    Start     Dose/Rate Route Frequency Ordered Stop   05/17/23 1500  ceFAZolin (ANCEF) IVPB 2g/100 mL premix        2 g 200 mL/hr over 30 Minutes Intravenous To Radiology 05/16/23 1204 05/17/23 1537   05/08/23 1000  ceFAZolin (ANCEF) IVPB 2g/100 mL premix        2 g 200 mL/hr over 30 Minutes Intravenous To Radiology 05/07/23 1537 05/09/23 1000   05/05/23 1515  cefTRIAXone (ROCEPHIN) 1 g in sodium chloride 0.9 % 100 mL IVPB        1 g 200 mL/hr over 30 Minutes Intravenous  Once 05/05/23 1510 05/05/23 1642   05/05/23 1515  azithromycin (ZITHROMAX) 500 mg in sodium chloride 0.9 % 250 mL IVPB        500 mg 250 mL/hr over 60 Minutes Intravenous  Once 05/05/23 1510 05/05/23 1750        MEDICATIONS: Scheduled Meds:  atorvastatin  80 mg Oral Daily   bisacodyl  10 mg Rectal Daily   carvedilol  6.25 mg Oral BID WC   Chlorhexidine Gluconate Cloth  6 each Topical Q0600   Chlorhexidine Gluconate Cloth  6 each Topical Q0600   Chlorhexidine Gluconate Cloth  6 each Topical Q0600   [START ON 05/22/2023] Chlorhexidine Gluconate Cloth  6 each Topical Q0600   clopidogrel  75 mg Oral Daily    diphenhydrAMINE  25 mg Intravenous Once   docusate sodium  100 mg Oral BID   fluticasone  2 spray Each Nare Daily   guaiFENesin  600 mg Oral BID   heparin  5,000 Units Subcutaneous Q8H   insulin aspart  0-15 Units Subcutaneous TID WC   insulin aspart  0-5 Units Subcutaneous QHS   insulin aspart  3 Units Subcutaneous TID WC   insulin glargine-yfgn  15 Units Subcutaneous Daily   oxymetazoline  1 spray Each Nare Once   sodium chloride flush  3 mL Intravenous Q12H   Continuous Infusions:  sodium chloride Stopped (05/09/23 2241)   PRN Meds:.sodium chloride, acetaminophen, albuterol, heparin, HYDROmorphone (DILAUDID) injection, LORazepam, melatonin, ondansetron **OR** ondansetron (ZOFRAN) IV, mouth rinse, oxyCODONE, sodium chloride, sodium chloride flush   I have personally reviewed following labs and imaging studies  LABORATORY DATA: CBC: Recent Labs  Lab 05/16/23  1610 05/19/23 1132 05/21/23 0405  WBC 9.0 12.8* 20.5*  HGB 11.0* 10.9* 10.5*  HCT 32.0* 32.0* 30.0*  MCV 91.4 90.9 91.2  PLT 319 291 272     Basic Metabolic Panel: Recent Labs  Lab 05/17/23 0433 05/18/23 0535 05/19/23 0355 05/20/23 0422 05/21/23 0405  NA 131* 132* 128* 130* 131*  K 3.2* 3.7 3.1* 4.0 3.9  CL 94* 94* 92* 92* 93*  CO2 23 24 23 23 24   GLUCOSE 137* 134* 141* 209* 156*  BUN 53* 28* 38* 46* 31*  CREATININE 3.88* 3.69* 4.49* 4.90* 4.14*  CALCIUM 9.2 9.6 8.9 9.4 9.1  PHOS 5.5* 5.3* 5.0* 5.4* 5.3*     GFR: Estimated Creatinine Clearance: 11.5 mL/min (A) (by C-G formula based on SCr of 4.14 mg/dL (H)).  Liver Function Tests: Recent Labs  Lab 05/17/23 0433 05/18/23 0535 05/19/23 0355 05/20/23 0422 05/21/23 0405  AST  --   --   --   --  39  ALT  --   --   --   --  18  ALKPHOS  --   --   --   --  91  BILITOT  --   --   --   --  1.1  PROT  --   --   --   --  7.1  ALBUMIN 3.2* 3.4* 3.2* 3.2* 3.0*    No results for input(s): "LIPASE", "AMYLASE" in the last 168 hours. No results for  input(s): "AMMONIA" in the last 168 hours.  Coagulation Profile: Recent Labs  Lab 05/17/23 0433  INR 1.1     Cardiac Enzymes: No results for input(s): "CKTOTAL", "CKMB", "CKMBINDEX", "TROPONINI" in the last 168 hours.  BNP (last 3 results) No results for input(s): "PROBNP" in the last 8760 hours.  Lipid Profile: No results for input(s): "CHOL", "HDL", "LDLCALC", "TRIG", "CHOLHDL", "LDLDIRECT" in the last 72 hours.  Thyroid Function Tests: No results for input(s): "TSH", "T4TOTAL", "FREET4", "T3FREE", "THYROIDAB" in the last 72 hours.  Anemia Panel: No results for input(s): "VITAMINB12", "FOLATE", "FERRITIN", "TIBC", "IRON", "RETICCTPCT" in the last 72 hours.  Urine analysis:    Component Value Date/Time   COLORURINE YELLOW 05/05/2023 0927   APPEARANCEUR CLEAR 05/05/2023 0927   LABSPEC 1.010 05/05/2023 0927   PHURINE 6.0 05/05/2023 0927   GLUCOSEU 50 (A) 05/05/2023 0927   HGBUR NEGATIVE 05/05/2023 0927   BILIRUBINUR NEGATIVE 05/05/2023 0927   BILIRUBINUR negative 08/18/2021 1207   BILIRUBINUR neg 06/28/2017 1508   KETONESUR NEGATIVE 05/05/2023 0927   PROTEINUR 100 (A) 05/05/2023 0927   UROBILINOGEN 0.2 08/18/2021 1207   UROBILINOGEN 0.2 01/15/2015 1032   NITRITE NEGATIVE 05/05/2023 0927   LEUKOCYTESUR NEGATIVE 05/05/2023 0927    Sepsis Labs: Lactic Acid, Venous    Component Value Date/Time   LATICACIDVEN 2.22 (H) 01/10/2015 1339    MICROBIOLOGY: Recent Results (from the past 240 hour(s))  Culture, blood (Routine X 2) w Reflex to ID Panel     Status: None (Preliminary result)   Collection Time: 05/19/23  6:06 PM   Specimen: BLOOD LEFT ARM  Result Value Ref Range Status   Specimen Description BLOOD LEFT ARM  Final   Special Requests   Final    BOTTLES DRAWN AEROBIC AND ANAEROBIC Blood Culture adequate volume   Culture   Final    NO GROWTH 2 DAYS Performed at [facility], [address], [place], [place] [address]    Report Status PENDING   Incomplete  Culture, blood (Routine X  2) w Reflex to ID Panel     Status: None (Preliminary result)   Collection Time: 05/19/23  6:06 PM   Specimen: BLOOD LEFT ARM  Result Value Ref Range Status   Specimen Description BLOOD LEFT ARM  Final   Special Requests   Final    BOTTLES DRAWN AEROBIC AND ANAEROBIC Blood Culture adequate volume   Culture   Final    NO GROWTH 2 DAYS Performed at [facility], [address], [place], [place] [address]    Report Status PENDING  Incomplete    RADIOLOGY STUDIES/RESULTS: No results found.   LOS: 16 days   Jeoffrey Massed, MD  [facility]    To contact the attending provider between 7A-7P or the covering provider during after hours 7P-7A, please log into the web site [email or web address] and access using universal [place] password for that web site. If you do not have the password, please call the hospital operator.  05/21/2023, 12:02 PM

## 2023-05-21 NOTE — Plan of Care (Signed)
[facility]  Initial Hemodialysis Orders  Dialysis center: [facility]  Patient's name: Samantha [name] DOB: 1951/03/19 AKI or ESRD: ESRD  Discharge diagnosis: New ESRD 2. Abdominal pain  Allergies:  Allergies  Allergen Reactions   Chicken Allergy Other (See Comments)    headache   Perflutren Other (See Comments)    Pt with back pain as soon as administered.- patient refuted this in 2024    Date of First Dialysis: 05/12/23 Cause of renal disease: Diabetes mellitus  Dialysis Prescription: Dialysis Frequency: TIW Tx duration: 4 hours BFR: 400 DFR: 800 EDW: 62.5kg  Dialyzer: 180NRe UF profile/Sodium modeling?: No Dialysis Bath: 2 K 2 Ca  Dialysis access: Access type: [facility] Date placed: 05/12/23 Surgeon: Dr. Lowella Dandy Needle gauge: N/A  In Center Medications: Heparin Dose: none  Type: N/A VDRA: None Venofer: None Mircera: None Next dose due: N/A Sensipar: None  Discharge labs: Hgb: 10.5 K+: 3.9  Ca: 9.1  Phos: 5.3 Alb: 3.0  Please draw routine labs. Additional labs needed: None Additional notes/follow-up: none  Rogers Blocker, PA-C 05/21/2023, 6:20 PM  [facility] Pager: [phone]

## 2023-05-22 ENCOUNTER — Inpatient Hospital Stay (HOSPITAL_COMMUNITY): Payer: Medicaid Other

## 2023-05-22 DIAGNOSIS — G928 Other toxic encephalopathy: Secondary | ICD-10-CM

## 2023-05-22 LAB — BLOOD GAS, ARTERIAL
Acid-Base Excess: 3.1 mmol/L — ABNORMAL HIGH (ref 0.0–2.0)
Bicarbonate: 24.8 mmol/L (ref 20.0–28.0)
Drawn by: 44135
O2 Saturation: 96.1 %
Patient temperature: 37
pCO2 arterial: 29 mmHg — ABNORMAL LOW (ref 32–48)
pH, Arterial: 7.54 — ABNORMAL HIGH (ref 7.35–7.45)
pO2, Arterial: 69 mmHg — ABNORMAL LOW (ref 83–108)

## 2023-05-22 LAB — CBC
HCT: 31.1 % — ABNORMAL LOW (ref 36.0–46.0)
Hemoglobin: 10.7 g/dL — ABNORMAL LOW (ref 12.0–15.0)
MCH: 31.5 pg (ref 26.0–34.0)
MCHC: 34.4 g/dL (ref 30.0–36.0)
MCV: 91.5 fL (ref 80.0–100.0)
Platelets: 322 10*3/uL (ref 150–400)
RBC: 3.4 MIL/uL — ABNORMAL LOW (ref 3.87–5.11)
RDW: 13.2 % (ref 11.5–15.5)
WBC: 20.7 10*3/uL — ABNORMAL HIGH (ref 4.0–10.5)
nRBC: 0 % (ref 0.0–0.2)

## 2023-05-22 LAB — HEPATIC FUNCTION PANEL
ALT: 16 U/L (ref 0–44)
AST: 33 U/L (ref 15–41)
Albumin: 2.7 g/dL — ABNORMAL LOW (ref 3.5–5.0)
Alkaline Phosphatase: 100 U/L (ref 38–126)
Bilirubin, Direct: 0.2 mg/dL (ref 0.0–0.2)
Indirect Bilirubin: 0.8 mg/dL (ref 0.3–0.9)
Total Bilirubin: 1 mg/dL (ref 0.3–1.2)
Total Protein: 7.5 g/dL (ref 6.5–8.1)

## 2023-05-22 LAB — RENAL FUNCTION PANEL
Albumin: 2.8 g/dL — ABNORMAL LOW (ref 3.5–5.0)
Anion gap: 18 — ABNORMAL HIGH (ref 5–15)
BUN: 60 mg/dL — ABNORMAL HIGH (ref 8–23)
CO2: 21 mmol/L — ABNORMAL LOW (ref 22–32)
Calcium: 9.3 mg/dL (ref 8.9–10.3)
Chloride: 92 mmol/L — ABNORMAL LOW (ref 98–111)
Creatinine, Ser: 5.87 mg/dL — ABNORMAL HIGH (ref 0.44–1.00)
GFR, Estimated: 7 mL/min — ABNORMAL LOW (ref >60)
Glucose, Bld: 182 mg/dL — ABNORMAL HIGH (ref 70–99)
Phosphorus: 7.9 mg/dL — ABNORMAL HIGH (ref 2.5–4.6)
Potassium: 4.1 mmol/L (ref 3.5–5.1)
Sodium: 131 mmol/L — ABNORMAL LOW (ref 135–145)

## 2023-05-22 LAB — CULTURE, BLOOD (ROUTINE X 2): Culture: NO GROWTH

## 2023-05-22 LAB — GLUCOSE, CAPILLARY
Glucose-Capillary: 233 mg/dL — ABNORMAL HIGH (ref 70–99)
Glucose-Capillary: 85 mg/dL (ref 70–99)
Glucose-Capillary: 227 mg/dL — ABNORMAL HIGH (ref 70–99)
Glucose-Capillary: 176 mg/dL — ABNORMAL HIGH (ref 70–99)

## 2023-05-22 LAB — AMMONIA: Ammonia: 15 umol/L (ref 9–35)

## 2023-05-22 MED ORDER — LIDOCAINE-PRILOCAINE 2.5-2.5 % EX CREA: 1.0000 | TOPICAL_CREAM | CUTANEOUS | Status: DC | PRN

## 2023-05-22 MED ORDER — HEPARIN SODIUM (PORCINE) 1000 UNIT/ML DIALYSIS: 1000.0000 [IU] | INTRAMUSCULAR | Status: DC | PRN

## 2023-05-22 MED ORDER — DEXTROSE 50 % IV SOLN
INTRAVENOUS | Status: AC
  Administered 2023-05-22: 50 mL
  Filled 2023-05-22: qty 50

## 2023-05-22 MED ORDER — PENTAFLUOROPROP-TETRAFLUOROETH EX AERO: 1.0000 | INHALATION_SPRAY | CUTANEOUS | Status: DC | PRN

## 2023-05-22 MED ORDER — LIDOCAINE HCL (PF) 1 % IJ SOLN: 5.0000 mL | INTRAMUSCULAR | Status: DC | PRN

## 2023-05-22 MED ORDER — ANTICOAGULANT SODIUM CITRATE 4% (200MG/5ML) IV SOLN: 5.0000 mL | Status: DC | PRN

## 2023-05-22 MED ORDER — HEPARIN SODIUM (PORCINE) 1000 UNIT/ML IJ SOLN
3200.0000 [IU] | Freq: Once | INTRAMUSCULAR | Status: AC
  Administered 2023-05-22: 3200 [IU]

## 2023-05-22 MED ORDER — ALTEPLASE 2 MG IJ SOLR: 2.0000 mg | Freq: Once | INTRAMUSCULAR | Status: DC | PRN

## 2023-05-22 NOTE — Progress Notes (Signed)
MB secure chat with ordering Dr. Jerral Ralph, Not urgent at this time, Pt still altered from Dialysis treatment, If not felt needed on revaluation, they will d/c order.

## 2023-05-22 NOTE — Progress Notes (Signed)
At the end of treatment pt had an incontinent BM while cleaning the pt  she began moaning.  Called translator to assess pt orientation and to ask her what was wrong she wouldn't respond to them justt moaning. Vs stable blood glucose WDL.She is only responding to pain. Called rapid to assess pt. She still only respond to pain. Dr. Jerral Ralph came to bedside to assess called pt daughter [name] and pt responded to her. D50 given per Dr. Jerral Ralph. New orders place by Dr. Jerral Ralph

## 2023-05-22 NOTE — Progress Notes (Signed)
PROGRESS NOTE        PATIENT DETAILS Name: Samantha [name] Age: 72 y.o. Sex: female Date of Birth: 01/29/1951 Admit Date: 05/05/2023 Admitting Physician Meredeth Ide, MD ZOX:WRUEAV, Odette Horns, MD  Brief Summary: Patient is a 72 y.o.  female with history of HTN, DM-2, PAD, CVA, depression/anxiety-who presented with acute hypoxic respiratory failure in the setting of pulm edema with worsening renal function.  Unfortunately-thought to have progressed to ESRD-requiring HD, for the hospital course complicated by development of acute calculus cholecystitis.  Significant events: 5/8>> admit to [facility]  Significant studies: 5/ 8>> CT chest/abdomen/pelvis: New bilateral pleural effusions, right> left parenchymal opacities 5/8>> renal ultrasound: No hydronephrosis 5/9??  Echo: EF 55-60% 5/12>> CT chest: Increasing interstitial/patchy groundglass opacities-decreasing small bilateral pleural effusions. 5/14>> gastric emptying study: Nondiagnostic due to emesis 5/24>> HIDA scan: Non- visualization of cystic duct  Significant microbiology data: 5/8>> COVID PCR: Negative 5/8>> blood culture: Negative 5/11>> pleural fluid culture: No growth 5/22>> blood culture: No growth  Procedures: 5/11>> thoracocentesis (pleural fluid cytology negative) 5/11>> nontunneled dialysis catheter by PCCM 5/20>> tunneled dialysis catheter by IR   Consults: PCCM Nephrology Interventional radiology  Subjective: Improved RUQ pain.  Objective: Vitals: Blood pressure 106/65, pulse 85, temperature 97.8 F (36.6 C), temperature source Oral, resp. rate 19, height 5\' 5"  (1.651 m), weight 60.6 kg, SpO2 92 %.   Exam: Gen Exam:Alert awake-not in any distress HEENT:atraumatic, normocephalic Chest: B/L clear to auscultation anteriorly CVS:S1S2 regular Abdomen: Soft-mildly tender in the RUQ area. Extremities:no edema Neurology: Non focal Skin: no rash  Pertinent Labs/Radiology:     Latest Ref Rng & Units 05/22/2023    3:08 AM 05/21/2023    4:05 AM 05/19/2023   11:32 AM  CBC  WBC 4.0 - 10.5 K/uL 20.7  20.5  12.8   Hemoglobin 12.0 - 15.0 g/dL 40.9  81.1  91.4   Hematocrit 36.0 - 46.0 % 31.1  30.0  32.0   Platelets 150 - 400 K/uL 322  272  291     Lab Results  Component Value Date   NA 131 (L) 05/22/2023   K 4.1 05/22/2023   CL 92 (L) 05/22/2023   CO2 21 (L) 05/22/2023      Assessment/Plan: AKI on CKD stage IIIb Likely hemodynamically mediated-suspicion that she may have progressed to ESRD Nephrology following and directing HD care/scheduled Continue to monitor for signs of renal recovery  Acute hypoxic respiratory failure Secondary to acute pulm edema in the setting of worsening renal function/pleural effusion.  (PNA ruled out) Volume being managed with HD-dose IV Lasix Currently improved-and on room air  Acute calculus cholecystitis  Had low-grade fever 5/22-significant worsening leukocytosis on 5/24-subsequent positive HIDA scan on 5/24 On now on Zosyn Allowing Plavix washout-General surgery planning cholecystostomy tube placement.  Hyponatremia Due to impaired water excretion in the setting of worsening renal function Improving with ongoing volume removal with hemodialysis.  Hypokalemia Repleted  Acute on chronic HFrEF Acute pulm edema Volume status stable with HD/IV Lasix  History of CAD No anginal symptoms Echo with stable EF Plavix hold-given plans for cholecystostomy tube placement. Continue beta-blocker/statin  HTN BP stable on Coreg Follow/optimize  HLD Statin  DM-2 (A1c 7.7 on 4/18) CBGs stable on 15 units of Semglee daily, 3 units of NovoLog with meals and SSI  Recent Labs    05/21/23 1718 05/21/23 2137 05/22/23  0740  GLUCAP 177* 176* 233*     Subcentimeter left thyroid nodule Per radiology-not clinically significant-no follow-up recommendations.  BMI: Estimated body mass index is 22.23 kg/m as calculated from the  following:   Height as of this encounter: 5\' 5"  (1.651 m).   Weight as of this encounter: 60.6 kg.   Code status:   Code Status: Full Code   DVT Prophylaxis: heparin injection 5,000 Units Start: 05/09/23 0600   Family Communication: Family member at bedside.   Disposition Plan: Status is: Inpatient Remains inpatient appropriate because: Severity of illness   Planned Discharge Destination:Home   Diet: Diet Order             Diet clear liquid Room service appropriate? Yes; Fluid consistency: Thin  Diet effective now                     Antimicrobial agents: Anti-infectives (From admission, onward)    Start     Dose/Rate Route Frequency Ordered Stop   05/21/23 1830  piperacillin-tazobactam (ZOSYN) IVPB 2.25 g        2.25 g 100 mL/hr over 30 Minutes Intravenous Every 8 hours 05/21/23 1740     05/17/23 1500  ceFAZolin (ANCEF) IVPB 2g/100 mL premix        2 g 200 mL/hr over 30 Minutes Intravenous To Radiology 05/16/23 1204 05/17/23 1537   05/08/23 1000  ceFAZolin (ANCEF) IVPB 2g/100 mL premix        2 g 200 mL/hr over 30 Minutes Intravenous To Radiology 05/07/23 1537 05/09/23 1000   05/05/23 1515  cefTRIAXone (ROCEPHIN) 1 g in sodium chloride 0.9 % 100 mL IVPB        1 g 200 mL/hr over 30 Minutes Intravenous  Once 05/05/23 1510 05/05/23 1642   05/05/23 1515  azithromycin (ZITHROMAX) 500 mg in sodium chloride 0.9 % 250 mL IVPB        500 mg 250 mL/hr over 60 Minutes Intravenous  Once 05/05/23 1510 05/05/23 1750        MEDICATIONS: Scheduled Meds:  atorvastatin  80 mg Oral Daily   bisacodyl  10 mg Rectal Daily   carvedilol  6.25 mg Oral BID WC   Chlorhexidine Gluconate Cloth  6 each Topical Q0600   diphenhydrAMINE  25 mg Intravenous Once   docusate sodium  100 mg Oral BID   fluticasone  2 spray Each Nare Daily   guaiFENesin  600 mg Oral BID   heparin  5,000 Units Subcutaneous Q8H   heparin sodium (porcine)  3,200 Units Intracatheter Once   insulin aspart   0-15 Units Subcutaneous TID WC   insulin aspart  0-5 Units Subcutaneous QHS   insulin aspart  3 Units Subcutaneous TID WC   insulin glargine-yfgn  15 Units Subcutaneous Daily   oxymetazoline  1 spray Each Nare Once   sodium chloride flush  3 mL Intravenous Q12H   Continuous Infusions:  sodium chloride Stopped (05/09/23 2241)   anticoagulant sodium citrate     piperacillin-tazobactam (ZOSYN)  IV 2.25 g (05/22/23 0530)   PRN Meds:.sodium chloride, acetaminophen, albuterol, alteplase, anticoagulant sodium citrate, heparin, heparin, HYDROmorphone (DILAUDID) injection, lidocaine (PF), lidocaine-prilocaine, LORazepam, melatonin, ondansetron **OR** ondansetron (ZOFRAN) IV, mouth rinse, oxyCODONE, pentafluoroprop-tetrafluoroeth, sodium chloride, sodium chloride flush   I have personally reviewed following labs and imaging studies  LABORATORY DATA: CBC: Recent Labs  Lab 05/16/23 0325 05/19/23 1132 05/21/23 0405 05/22/23 0308  WBC 9.0 12.8* 20.5* 20.7*  HGB 11.0* 10.9* 10.5* 10.7*  HCT 32.0* 32.0* 30.0* 31.1*  MCV 91.4 90.9 91.2 91.5  PLT 319 291 272 322     Basic Metabolic Panel: Recent Labs  Lab 05/18/23 0535 05/19/23 0355 05/20/23 0422 05/21/23 0405 05/22/23 0309  NA 132* 128* 130* 131* 131*  K 3.7 3.1* 4.0 3.9 4.1  CL 94* 92* 92* 93* 92*  CO2 24 23 23 24  21*  GLUCOSE 134* 141* 209* 156* 182*  BUN 28* 38* 46* 31* 60*  CREATININE 3.69* 4.49* 4.90* 4.14* 5.87*  CALCIUM 9.6 8.9 9.4 9.1 9.3  PHOS 5.3* 5.0* 5.4* 5.3* 7.9*     GFR: Estimated Creatinine Clearance: 8.1 mL/min (A) (by C-G formula based on SCr of 5.87 mg/dL (H)).  Liver Function Tests: Recent Labs  Lab 05/19/23 0355 05/20/23 0422 05/21/23 0405 05/22/23 0308 05/22/23 0309  AST  --   --  39 33  --   ALT  --   --  18 16  --   ALKPHOS  --   --  91 100  --   BILITOT  --   --  1.1 1.0  --   PROT  --   --  7.1 7.5  --   ALBUMIN 3.2* 3.2* 3.0* 2.7* 2.8*    No results for input(s): "LIPASE", "AMYLASE" in  the last 168 hours. No results for input(s): "AMMONIA" in the last 168 hours.  Coagulation Profile: Recent Labs  Lab 05/17/23 0433  INR 1.1     Cardiac Enzymes: No results for input(s): "CKTOTAL", "CKMB", "CKMBINDEX", "TROPONINI" in the last 168 hours.  BNP (last 3 results) No results for input(s): "PROBNP" in the last 8760 hours.  Lipid Profile: No results for input(s): "CHOL", "HDL", "LDLCALC", "TRIG", "CHOLHDL", "LDLDIRECT" in the last 72 hours.  Thyroid Function Tests: No results for input(s): "TSH", "T4TOTAL", "FREET4", "T3FREE", "THYROIDAB" in the last 72 hours.  Anemia Panel: No results for input(s): "VITAMINB12", "FOLATE", "FERRITIN", "TIBC", "IRON", "RETICCTPCT" in the last 72 hours.  Urine analysis:    Component Value Date/Time   COLORURINE YELLOW 05/05/2023 0927   APPEARANCEUR CLEAR 05/05/2023 0927   LABSPEC 1.010 05/05/2023 0927   PHURINE 6.0 05/05/2023 0927   GLUCOSEU 50 (A) 05/05/2023 0927   HGBUR NEGATIVE 05/05/2023 0927   BILIRUBINUR NEGATIVE 05/05/2023 0927   BILIRUBINUR negative 08/18/2021 1207   BILIRUBINUR neg 06/28/2017 1508   KETONESUR NEGATIVE 05/05/2023 0927   PROTEINUR 100 (A) 05/05/2023 0927   UROBILINOGEN 0.2 08/18/2021 1207   UROBILINOGEN 0.2 01/15/2015 1032   NITRITE NEGATIVE 05/05/2023 0927   LEUKOCYTESUR NEGATIVE 05/05/2023 0927    Sepsis Labs: Lactic Acid, Venous    Component Value Date/Time   LATICACIDVEN 2.22 (H) 01/10/2015 1339    MICROBIOLOGY: Recent Results (from the past 240 hour(s))  Culture, blood (Routine X 2) w Reflex to ID Panel     Status: None (Preliminary result)   Collection Time: 05/19/23  6:06 PM   Specimen: BLOOD LEFT ARM  Result Value Ref Range Status   Specimen Description BLOOD LEFT ARM  Final   Special Requests   Final    BOTTLES DRAWN AEROBIC AND ANAEROBIC Blood Culture adequate volume   Culture   Final    NO GROWTH 3 DAYS Performed at [facility], [address], [place], [place] [address]     Report Status PENDING  Incomplete  Culture, blood (Routine X 2) w Reflex to ID Panel     Status: None (Preliminary result)   Collection Time: 05/19/23  6:06 PM  Specimen: BLOOD LEFT ARM  Result Value Ref Range Status   Specimen Description BLOOD LEFT ARM  Final   Special Requests   Final    BOTTLES DRAWN AEROBIC AND ANAEROBIC Blood Culture adequate volume   Culture   Final    NO GROWTH 3 DAYS Performed at [facility], [address] [place], [place] [address]    Report Status PENDING  Incomplete    RADIOLOGY STUDIES/RESULTS: NM Hepatobiliary Liver Func  Result Date: 05/21/2023 CLINICAL DATA:  Gallstones. EXAM: NUCLEAR MEDICINE HEPATOBILIARY IMAGING TECHNIQUE: Sequential images of the abdomen were obtained out to 60 minutes following intravenous administration of radiopharmaceutical. RADIOPHARMACEUTICALS:  5.0 mCi Tc-61m  Choletec IV COMPARISON:  CT 05/05/2023 FINDINGS: Prompt uptake and biliary excretion of activity by the liver is seen. Biliary activity passes into small bowel, consistent with patent common bile duct. No gallbladder activity identified after 120 minutes of sequential imaging. IMPRESSION: 1. Nonvisualization of the gallbladder after 120 minutes of imaging compatible with cholecystitis. 2. Normal biliary to bowel transit consistent with patent common bile duct. Electronically Signed   By: Signa Kell M.D.   On: 05/21/2023 17:23     LOS: 17 days   Jeoffrey Massed, MD  [facility]    To contact the attending provider between 7A-7P or the covering provider during after hours 7P-7A, please log into the web site [email or web address] and access using universal [place] password for that web site. If you do not have the password, please call the hospital operator.  05/22/2023, 11:32 AM

## 2023-05-22 NOTE — Progress Notes (Signed)
Brief update: Patient had an episode of bowel incontinence in the HD unit and had a brief episode of unresponsiveness.  CT head with no acute finding.  If patient needed angiogram then I think the benefit outweighs the risk of contrast especially since patient is ESRD.  Discussed with the primary team.

## 2023-05-22 NOTE — Progress Notes (Signed)
RT obtained ABG and sent to Main Lab at this time.

## 2023-05-22 NOTE — Progress Notes (Signed)
Interventional Radiology Brief Note:  IR consulted for percutaneous cholecystostomy tube placement.  Case reviewed and approved by Dr. Archer Asa.  Unfortuantely, patient has been in dialysis all morning.  IR planning to send for patient, however remains in HD now being assessed by rapid response.   Will place orders for NPO p MN, hold heparin for now.  Plan to proceed with perc chole tomorrow (5/26).  Loyce Dys, MS RD PA-C

## 2023-05-22 NOTE — Progress Notes (Signed)
   Subjective/Chief Complaint: Denies abd pain today   Objective: Vital signs in last 24 hours: Temp:  [97.8 F (36.6 C)-99.2 F (37.3 C)] 97.8 F (36.6 C) (05/25 0854) Pulse Rate:  [72-87] 74 (05/25 0905) Resp:  [13-34] 20 (05/25 0905) BP: (112-132)/(54-72) 130/59 (05/25 0905) SpO2:  [90 %-93 %] 91 % (05/25 0905) Weight:  [60.6 kg-62.5 kg] 60.6 kg (05/25 0850) Last BM Date : 05/22/23  Intake/Output from previous day: 05/24 0701 - 05/25 0700 In: 50 [IV Piggyback:50] Out: -  Intake/Output this shift: Total I/O In: 3 [I.V.:3] Out: -   General appearance: alert and cooperative Resp: clear to auscultation bilaterally Cardio: regular rate and rhythm GI: soft, nontender  Lab Results:  Recent Labs    05/21/23 0405 05/22/23 0308  WBC 20.5* 20.7*  HGB 10.5* 10.7*  HCT 30.0* 31.1*  PLT 272 322   BMET Recent Labs    05/21/23 0405 05/22/23 0309  NA 131* 131*  K 3.9 4.1  CL 93* 92*  CO2 24 21*  GLUCOSE 156* 182*  BUN 31* 60*  CREATININE 4.14* 5.87*  CALCIUM 9.1 9.3   PT/INR No results for input(s): "LABPROT", "INR" in the last 72 hours. ABG No results for input(s): "PHART", "HCO3" in the last 72 hours.  Invalid input(s): "PCO2", "PO2"  Studies/Results: NM Hepatobiliary Liver Func  Result Date: 05/21/2023 CLINICAL DATA:  Gallstones. EXAM: NUCLEAR MEDICINE HEPATOBILIARY IMAGING TECHNIQUE: Sequential images of the abdomen were obtained out to 60 minutes following intravenous administration of radiopharmaceutical. RADIOPHARMACEUTICALS:  5.0 mCi Tc-10m  Choletec IV COMPARISON:  CT 05/05/2023 FINDINGS: Prompt uptake and biliary excretion of activity by the liver is seen. Biliary activity passes into small bowel, consistent with patent common bile duct. No gallbladder activity identified after 120 minutes of sequential imaging. IMPRESSION: 1. Nonvisualization of the gallbladder after 120 minutes of imaging compatible with cholecystitis. 2. Normal biliary to bowel  transit consistent with patent common bile duct. Electronically Signed   By: Signa Kell M.D.   On: 05/21/2023 17:23    Anti-infectives: Anti-infectives (From admission, onward)    Start     Dose/Rate Route Frequency Ordered Stop   05/21/23 1830  piperacillin-tazobactam (ZOSYN) IVPB 2.25 g        2.25 g 100 mL/hr over 30 Minutes Intravenous Every 8 hours 05/21/23 1740     05/17/23 1500  ceFAZolin (ANCEF) IVPB 2g/100 mL premix        2 g 200 mL/hr over 30 Minutes Intravenous To Radiology 05/16/23 1204 05/17/23 1537   05/08/23 1000  ceFAZolin (ANCEF) IVPB 2g/100 mL premix        2 g 200 mL/hr over 30 Minutes Intravenous To Radiology 05/07/23 1537 05/09/23 1000   05/05/23 1515  cefTRIAXone (ROCEPHIN) 1 g in sodium chloride 0.9 % 100 mL IVPB        1 g 200 mL/hr over 30 Minutes Intravenous  Once 05/05/23 1510 05/05/23 1642   05/05/23 1515  azithromycin (ZITHROMAX) 500 mg in sodium chloride 0.9 % 250 mL IVPB        500 mg 250 mL/hr over 60 Minutes Intravenous  Once 05/05/23 1510 05/05/23 1750       Assessment/Plan: s/p * No surgery found * Cholecystitis.  Plan for allow plavix to wear off the place perc chole tube Continue abx Hemodialysis today  LOS: 17 days    Chevis Pretty III 05/22/2023

## 2023-05-22 NOTE — Consult Note (Signed)
Neurology Consultation  Reason for Consult: Altered mental status, aphasia Referring Physician: Dr. Jerral Ralph  CC: Altered mental status, inability to talk of sudden onset  History is obtained from: Chart  HPI: Samantha [name] is a 72 y.o. female who is currently being treated by the hospitalist service for AKI on CKD, acute hypoxic respiratory failure, acute acalculous cholecystitis, hyponatremia and hypokalemia along with acute on chronic heart failure, history of hypertension hyperlipidemia diabetes, was seen this morning by the primary team and was in her usual state of health, somewhat conversational but right after she got dialyzed, towards the end of it became nonverbal and altered looking with only response to everything being with some moaning and groaning. I was called for an emergent consultation in the dialysis unit because of this change.  Patient was taken for stat CT head which was unremarkable for acute process.  Given that she is a recent dialysis, CT angio was not performed and she was taken for a stat MRI of the brain, which on my personal review did not reveal any acute stroke.  Currently she has multiple active issues being treated by the primary team including AKI on CKD, acute acalculous cholecystitis for which she is due for an IR procedure, hyponatremia.  No family member at bedside to provide history.  Patient unable to provide history.  LKW: Probably 8 AM IV thrombolysis given?: no, outside the window, exam more consistent with encephalopathy than stroke EVT: No-exam more consistent with encephalopathy than LVO stroke Premorbid modified Rankin scale (mRS):4   ROS unable to ascertain due to mentation  Past Medical History:  Diagnosis Date   Acute pyelonephritis    Bacteremia, escherichia coli 01/12/2015   Chest pain    Critical lower limb ischemia (HCC)    Hyperlipidemia    Hypertension    Left carotid bruit    PAD (peripheral artery disease) (HCC)    Type  II diabetes mellitus (HCC)    Family History  Problem Relation Age of Onset   Hypertension Mother      Social History:   reports that she has never smoked. She has never used smokeless tobacco. She reports current alcohol use of about 1.0 - 2.0 standard drink of alcohol per week. She reports that she does not use drugs.  Medications  Current Facility-Administered Medications:    0.9 %  sodium chloride infusion, 250 mL, Intravenous, PRN, Sharl Ma, Sarina Ill, MD, Stopped at 05/09/23 2241   acetaminophen (TYLENOL) tablet 650 mg, 650 mg, Oral, Q6H PRN, Maretta Bees, MD, 650 mg at 05/19/23 1755   albuterol (PROVENTIL) (2.5 MG/3ML) 0.083% nebulizer solution 2.5 mg, 2.5 mg, Nebulization, Q4H PRN, Sharl Ma, Sarina Ill, MD   atorvastatin (LIPITOR) tablet 80 mg, 80 mg, Oral, Daily, Luciano Cutter, MD, 80 mg at 05/21/23 1750   bisacodyl (DULCOLAX) suppository 10 mg, 10 mg, Rectal, Daily, Pokhrel, Laxman, MD, 10 mg at 05/13/23 0830   carvedilol (COREG) tablet 6.25 mg, 6.25 mg, Oral, BID WC, [place]gergawy, Leana Roe, MD, 6.25 mg at 05/21/23 1750   Chlorhexidine Gluconate Cloth 2 % PADS 6 each, 6 each, Topical, Q0600, Maxie Barb, MD, 6 each at 05/22/23 0537   diphenhydrAMINE (BENADRYL) injection 25 mg, 25 mg, Intravenous, Once, [place], Gagan S, MD   docusate sodium (COLACE) capsule 100 mg, 100 mg, Oral, BID, Pokhrel, Laxman, MD, 100 mg at 05/21/23 2139   fluticasone (FLONASE) 50 MCG/ACT nasal spray 2 spray, 2 spray, Each Nare, Daily, Sharl Ma, Sarina Ill, MD, 2 spray at  05/18/23 0849   guaiFENesin (MUCINEX) 12 hr tablet 600 mg, 600 mg, Oral, BID, Sharl Ma, Gagan S, MD, 600 mg at 05/21/23 2139   heparin injection 1,000-6,000 Units, 1,000-6,000 Units, CRRT, PRN, Lorin Glass, MD, 1,000 Units at 05/17/23 1159   heparin injection 5,000 Units, 5,000 Units, Subcutaneous, Q8H, Luciano Cutter, MD, 5,000 Units at 05/21/23 2139   HYDROmorphone (DILAUDID) injection 0.5 mg, 0.5 mg, Intravenous, Q4H PRN, Maretta Bees, MD, 0.5 mg at 05/22/23 0529   insulin aspart (novoLOG) injection 0-15 Units, 0-15 Units, Subcutaneous, TID WC, Pokhrel, Laxman, MD, 5 Units at 05/22/23 0820   insulin aspart (novoLOG) injection 0-5 Units, 0-5 Units, Subcutaneous, QHS, Pokhrel, Laxman, MD, 3 Units at 05/17/23 2044   insulin aspart (novoLOG) injection 3 Units, 3 Units, Subcutaneous, TID WC, [place], Leana Roe, MD, 3 Units at 05/22/23 0820   insulin glargine-yfgn (SEMGLEE) injection 15 Units, 15 Units, Subcutaneous, Daily, Elgergawy, Leana Roe, MD, 15 Units at 05/20/23 1241   LORazepam (ATIVAN) tablet 0.5 mg, 0.5 mg, Oral, Q6H PRN, Howerter, Justin B, DO   melatonin tablet 3 mg, 3 mg, Oral, QHS PRN, Howerter, Justin B, DO   ondansetron (ZOFRAN) tablet 4 mg, 4 mg, Oral, Q6H PRN, 4 mg at 05/18/23 0854 **OR** ondansetron (ZOFRAN) injection 4 mg, 4 mg, Intravenous, Q6H PRN, Meredeth Ide, MD, 4 mg at 05/17/23 2354   Oral care mouth rinse, 15 mL, Mouth Rinse, PRN, Elgergawy, Leana Roe, MD   oxyCODONE (Oxy IR/ROXICODONE) immediate release tablet 5 mg, 5 mg, Oral, Q4H PRN, Pokhrel, Laxman, MD, 5 mg at 05/22/23 0819   oxymetazoline (AFRIN) 0.05 % nasal spray 1 spray, 1 spray, Each Nare, Once, [place], Sarina Ill, MD   piperacillin-tazobactam (ZOSYN) IVPB 2.25 g, 2.25 g, Intravenous, Q8H, Reome, Earle J, RPH, Last Rate: 100 mL/hr at 05/22/23 0530, 2.25 g at 05/22/23 0530   sodium chloride (OCEAN) 0.65 % nasal spray 1 spray, 1 spray, Each Nare, PRN, Sharl Ma, Sarina Ill, MD   sodium chloride flush (NS) 0.9 % injection 3 mL, 3 mL, Intravenous, Q12H, Sharl Ma, Gagan S, MD, 3 mL at 05/22/23 9147   sodium chloride flush (NS) 0.9 % injection 3 mL, 3 mL, Intravenous, PRN, Sharl Ma, Sarina Ill, MD  Exam: Current vital signs: BP (!) 150/59 (BP Location: Left Arm)   Pulse 83   Temp 97.8 F (36.6 C) (Oral)   Resp 20   Ht 5\' 5"  (1.651 m)   Wt 59.4 kg Comment: bed  SpO2 94%   BMI 21.78 kg/m  Vital signs in last 24 hours: Temp:  [97.8 F (36.6 C)-99.2 F (37.3 C)]  97.8 F (36.6 C) (05/25 1212) Pulse Rate:  [72-87] 83 (05/25 1335) Resp:  [12-34] 20 (05/25 1335) BP: (92-150)/(59-100) 150/59 (05/25 1335) SpO2:  [90 %-95 %] 94 % (05/25 1335) Weight:  [59.4 kg-62.5 kg] 59.4 kg (05/25 1220)  General: Drowsy, opens eyes to voice. HEENT: Normocephalic, atraumatic Lungs: Distant breath sounds bilaterally Cardiovascular: Regular rhythm Abdomen nondistended nontender Neurological exam Drowsy Opens eyes to voice Does not follow commands Nonverbal Cranial nerves: Pupils equal round react light, extraocular movements unhindered, blinks to threat inconsistently from both sides, face appears symmetric at rest.  Unable to assess while smiling as she does not follow commands. Motor exam: She is able to lift all 4 extremities against gravity-has asterixis in both upper extremities when outstretched. Sensation: Grimaces equally and withdraws mildly in all 4 extremities to noxious simulation Coordination difficult to assess given her mentation and inability  to follow commands Initial NIH stroke scale-15   Labs I have reviewed labs in epic and the results pertinent to this consultation are:  CBC    Component Value Date/Time   WBC 20.7 (H) 05/22/2023 0308   RBC 3.40 (L) 05/22/2023 0308   HGB 10.7 (L) 05/22/2023 0308   HCT 31.1 (L) 05/22/2023 0308   PLT 322 05/22/2023 0308   MCV 91.5 05/22/2023 0308   MCH 31.5 05/22/2023 0308   MCHC 34.4 05/22/2023 0308   RDW 13.2 05/22/2023 0308   LYMPHSABS 0.8 04/14/2023 1715   MONOABS 1.1 (H) 04/14/2023 1715   EOSABS 0.0 04/14/2023 1715   BASOSABS 0.1 04/14/2023 1715  White count is increased over the last few days from 12,000-20,000.  CMP     Component Value Date/Time   NA 131 (L) 05/22/2023 0309   NA 140 04/12/2023 1702   K 4.1 05/22/2023 0309   CL 92 (L) 05/22/2023 0309   CO2 21 (L) 05/22/2023 0309   GLUCOSE 182 (H) 05/22/2023 0309   BUN 60 (H) 05/22/2023 0309   BUN 39 (H) 04/12/2023 1702   CREATININE  5.87 (H) 05/22/2023 0309   CREATININE 1.02 (H) 12/23/2016 0940   CALCIUM 9.3 05/22/2023 0309   PROT 7.5 05/22/2023 0308   PROT 6.8 04/12/2023 1702   ALBUMIN 2.8 (L) 05/22/2023 0309   ALBUMIN 3.9 04/12/2023 1702   AST 33 05/22/2023 0308   ALT 16 05/22/2023 0308   ALKPHOS 100 05/22/2023 0308   BILITOT 1.0 05/22/2023 0308   BILITOT <0.2 04/12/2023 1702   GFRNONAA 7 (L) 05/22/2023 0309   GFRNONAA 59 (L) 12/23/2016 0940   GFRAA 47 (L) 09/13/2018 1002   GFRAA 68 12/23/2016 0940    Imaging I have reviewed the images obtained:  CT-head-aspects 10.  No bleed.  Chronic basal ganglia and cerebellar infarcts  MRI examination of the brain-formal read pending.  On my review, no evidence of acute ischemic stroke.  Assessment: 72 year old with a past medical history recently started on dialysis because of the AKI over CKD in addition to multiple other medical comorbidities, after dialysis noted to be nonverbal.  Examination consistent with encephalopathy and patient definitely being nonverbal but did not appear to localize completely to the left MCA territory with no right-sided weakness.  In addition to being encephalopathic, she also had asterixis pointing towards a metabolic cause of this presentation. Irrespective given her complex medical history with multiple cerebrovascular risk factors, stat head CT was done which was unremarkable.  She was also taken for a stat MRI of the brain which on my pulmonary review was unremarkable for acute stroke. Her current presentation is likely due to toxic metabolic encephalopathy.  Impression: Toxic metabolic encephalopathy  Recommendations: Medical management of multiple toxic metabolic derangements per primary team as you are Interventional/surgical management of cholecystitis per the primary team-IR Check an EEG to make sure there is no underlying seizure activity Recommend checking ammonia levels-treat with lactulose if elevated Please call back with  any abnormalities on EEG  Plan discussed with Dr. Jerral Ralph. -- Milon Dikes, MD Neurologist [facility] Pager: [phone]

## 2023-05-22 NOTE — Significant Event (Addendum)
Rapid Response Event Note   Reason for Call :  Lethargy following HD treatment  Initial Focused Assessment:  Pt lying in bed, responds to voice. Not following commands or answering questions. Aphasic- only saying "no", "aww". PERRLA, 3mm. EOMI. Moving all extremities equally. No distress. Skin is warm, dry, pink.   LKW 0800- Upon arrival to HD, pt was conversing with RN and MD.   VS: T 97.3F, BP 121/67, HR 87, RR 21, SpO2 95% on room air  Interventions:  CBG: 85 CT head MRI ABG EEG  Plan of Care:  -Closely monitor pt. F/u with primary MD results of tests ordered.  Call rapid response for additional needs.   Event Summary:  MD Notified: Dr. Windell Norfolk Call Time: 1237 Arrival Time: 1240 End Time: 1335  Jennye Moccasin, RN

## 2023-05-22 NOTE — Progress Notes (Signed)
EEG complete - results pending 

## 2023-05-22 NOTE — Progress Notes (Signed)
Received patient in bed to unit.  Alert and oriented.  Informed consent signed and in chart.   TX duration:3  Patient tolerated well.  Transported back to the room  Alert, without acute distress.  Hand-off given to patient's nurse.   Access used: right [facility] Access issues: NONE  Total UF removed: Medication(s) given: none   05/22/23 1212  Vitals  Temp 97.8 F (36.6 C)  Temp Source Oral  BP 95/64  MAP (mmHg) 73  BP Location Left Arm  BP Method Automatic  Patient Position (if appropriate) Lying  Pulse Rate 86  Pulse Rate Source Monitor  ECG Heart Rate 87  Resp 17  Oxygen Therapy  SpO2 93 %  O2 Device Room Air  During Treatment Monitoring  HD Safety Checks Performed Yes  Intra-Hemodialysis Comments Tx completed  Dialysis Fluid Bolus Normal Saline  Bolus Amount (mL) 300 mL    Soua Lenk S [facility]

## 2023-05-22 NOTE — Procedures (Signed)
Patient was seen on dialysis and the procedure was supervised.  BFR 400  Via TDC BP is  116/66.   Patient appears to be tolerating treatment well. Noted surgery consult for cholecystitis.  Samantha [name] [name] 05/22/2023

## 2023-05-22 NOTE — Progress Notes (Signed)
Towards the end of HD-patient became almost nonverbal-significant change from prior, during my evaluation-at time she was able to say no-but was mostly groaning/moaning.  She had no focal deficits on exam.  Vital signs are stable.  Stat CT head did not show any acute abnormalities-neurology consulted-awaiting MRI results-patient is back in her room, family is at bedside (son-in-law/daughter-in-law) who reported that the patient has been talking to them since she is arrived back to her room.  She still however remains sleepy/lethargic-but appears comfortable-protecting airway-with stable vital signs.  Plan is to monitor-closely observe-and await imaging/ABG results.

## 2023-05-22 NOTE — Progress Notes (Signed)
Monitored patient during MRI, uneventful. VSS. Patient transported back >5W22 ,Primary RN updated.

## 2023-05-23 ENCOUNTER — Inpatient Hospital Stay (HOSPITAL_COMMUNITY): Payer: Medicaid Other

## 2023-05-23 DIAGNOSIS — R569 Unspecified convulsions: Secondary | ICD-10-CM

## 2023-05-23 DIAGNOSIS — R4182 Altered mental status, unspecified: Secondary | ICD-10-CM

## 2023-05-23 HISTORY — PX: IR PERC CHOLECYSTOSTOMY: IMG2326

## 2023-05-23 LAB — CBC WITH DIFFERENTIAL/PLATELET
Abs Immature Granulocytes: 0.2 10*3/uL — ABNORMAL HIGH (ref 0.00–0.07)
Basophils Absolute: 0 10*3/uL (ref 0.0–0.1)
Basophils Relative: 0 %
Eosinophils Absolute: 0 10*3/uL (ref 0.0–0.5)
Eosinophils Relative: 0 %
HCT: 31.8 % — ABNORMAL LOW (ref 36.0–46.0)
Hemoglobin: 10.8 g/dL — ABNORMAL LOW (ref 12.0–15.0)
Immature Granulocytes: 1 %
Lymphocytes Relative: 2 %
Lymphs Abs: 0.5 10*3/uL — ABNORMAL LOW (ref 0.7–4.0)
MCH: 31.1 pg (ref 26.0–34.0)
MCHC: 34 g/dL (ref 30.0–36.0)
MCV: 91.6 fL (ref 80.0–100.0)
Monocytes Absolute: 2.2 10*3/uL — ABNORMAL HIGH (ref 0.1–1.0)
Monocytes Relative: 9 %
Neutro Abs: 22.3 10*3/uL — ABNORMAL HIGH (ref 1.7–7.7)
Neutrophils Relative %: 88 %
Platelets: 351 10*3/uL (ref 150–400)
RBC: 3.47 MIL/uL — ABNORMAL LOW (ref 3.87–5.11)
RDW: 13 % (ref 11.5–15.5)
WBC: 25.2 10*3/uL — ABNORMAL HIGH (ref 4.0–10.5)
nRBC: 0 % (ref 0.0–0.2)

## 2023-05-23 LAB — RENAL FUNCTION PANEL
Albumin: 2.8 g/dL — ABNORMAL LOW (ref 3.5–5.0)
Anion gap: 21 — ABNORMAL HIGH (ref 5–15)
BUN: 46 mg/dL — ABNORMAL HIGH (ref 8–23)
CO2: 21 mmol/L — ABNORMAL LOW (ref 22–32)
Calcium: 9.1 mg/dL (ref 8.9–10.3)
Chloride: 91 mmol/L — ABNORMAL LOW (ref 98–111)
Creatinine, Ser: 5.21 mg/dL — ABNORMAL HIGH (ref 0.44–1.00)
GFR, Estimated: 8 mL/min — ABNORMAL LOW (ref >60)
Glucose, Bld: 237 mg/dL — ABNORMAL HIGH (ref 70–99)
Phosphorus: 7 mg/dL — ABNORMAL HIGH (ref 2.5–4.6)
Potassium: 3.8 mmol/L (ref 3.5–5.1)
Sodium: 133 mmol/L — ABNORMAL LOW (ref 135–145)

## 2023-05-23 LAB — CBC
HCT: 32.2 % — ABNORMAL LOW (ref 36.0–46.0)
Hemoglobin: 11 g/dL — ABNORMAL LOW (ref 12.0–15.0)
MCH: 32.4 pg (ref 26.0–34.0)
MCHC: 34.2 g/dL (ref 30.0–36.0)
MCV: 94.7 fL (ref 80.0–100.0)
Platelets: 340 10*3/uL (ref 150–400)
RBC: 3.4 MIL/uL — ABNORMAL LOW (ref 3.87–5.11)
RDW: 13.2 % (ref 11.5–15.5)
WBC: 23.3 10*3/uL — ABNORMAL HIGH (ref 4.0–10.5)
nRBC: 0 % (ref 0.0–0.2)

## 2023-05-23 LAB — GLUCOSE, CAPILLARY
Glucose-Capillary: 266 mg/dL — ABNORMAL HIGH (ref 70–99)
Glucose-Capillary: 164 mg/dL — ABNORMAL HIGH (ref 70–99)
Glucose-Capillary: 161 mg/dL — ABNORMAL HIGH (ref 70–99)
Glucose-Capillary: 199 mg/dL — ABNORMAL HIGH (ref 70–99)
Glucose-Capillary: 170 mg/dL — ABNORMAL HIGH (ref 70–99)

## 2023-05-23 LAB — ALKALINE PHOSPHATASE: Alkaline Phosphatase: 153 U/L — ABNORMAL HIGH (ref 38–126)

## 2023-05-23 LAB — BILIRUBIN, DIRECT: Bilirubin, Direct: 0.2 mg/dL (ref 0.0–0.2)

## 2023-05-23 LAB — PROTEIN, TOTAL: Total Protein: 7.7 g/dL (ref 6.5–8.1)

## 2023-05-23 LAB — AST: AST: 68 U/L — ABNORMAL HIGH (ref 15–41)

## 2023-05-23 LAB — ALT: ALT: 24 U/L (ref 0–44)

## 2023-05-23 LAB — BILIRUBIN, TOTAL: Total Bilirubin: 1 mg/dL (ref 0.3–1.2)

## 2023-05-23 MED ORDER — HYDROMORPHONE HCL 1 MG/ML IJ SOLN
INTRAMUSCULAR | Status: AC
  Filled 2023-05-23: qty 1

## 2023-05-23 MED ORDER — LIDOCAINE HCL 1 % IJ SOLN
INTRAMUSCULAR | Status: AC
  Filled 2023-05-23: qty 20

## 2023-05-23 MED ORDER — MIDAZOLAM HCL 2 MG/2ML IJ SOLN
INTRAMUSCULAR | Status: AC | PRN
  Administered 2023-05-23: .5 mg via INTRAVENOUS
  Administered 2023-05-23: 1 mg via INTRAVENOUS

## 2023-05-23 MED ORDER — IOHEXOL 300 MG/ML  SOLN
50.0000 mL | Freq: Once | INTRAMUSCULAR | Status: AC | PRN
  Administered 2023-05-23: 5 mL

## 2023-05-23 MED ORDER — CLOPIDOGREL BISULFATE 75 MG PO TABS
75.0000 mg | ORAL_TABLET | Freq: Every day | ORAL | Status: DC
  Administered 2023-05-24 – 2023-05-28 (×5): 75 mg via ORAL
  Filled 2023-05-23 (×5): qty 1

## 2023-05-23 MED ORDER — FENTANYL CITRATE (PF) 100 MCG/2ML IJ SOLN
INTRAMUSCULAR | Status: AC | PRN
  Administered 2023-05-23: 50 ug via INTRAVENOUS
  Administered 2023-05-23: 25 ug via INTRAVENOUS

## 2023-05-23 MED ORDER — MIDAZOLAM HCL 2 MG/2ML IJ SOLN
INTRAMUSCULAR | Status: AC
  Filled 2023-05-23: qty 2

## 2023-05-23 MED ORDER — HEPARIN SODIUM (PORCINE) 5000 UNIT/ML IJ SOLN
5000.0000 [IU] | Freq: Three times a day (TID) | INTRAMUSCULAR | Status: DC
  Administered 2023-05-23 – 2023-05-28 (×14): 5000 [IU] via SUBCUTANEOUS
  Filled 2023-05-23 (×14): qty 1

## 2023-05-23 MED ORDER — FENTANYL CITRATE (PF) 100 MCG/2ML IJ SOLN
INTRAMUSCULAR | Status: AC
  Filled 2023-05-23: qty 2

## 2023-05-23 MED ORDER — IOHEXOL 300 MG/ML  SOLN: 50.0000 mL | Freq: Once | INTRAMUSCULAR | Status: DC | PRN

## 2023-05-23 NOTE — Progress Notes (Signed)
LTM EEG discontinued - no skin breakdown at unhook.   

## 2023-05-23 NOTE — Plan of Care (Signed)
EEG with mostly triphasics but concern for possible GPD's at 2 Hz.  Hooked up to LTM overnight.  Continues to have triphasics that are usually seen with toxic metabolic encephalopathy. No seizures Discontinuing LTM No further recommendations from an inpatient neurologic perspective other than correction of toxic metabolic derangements. Please call with questions.   -- Milon Dikes, MD Neurologist [facility] Pager: [phone]

## 2023-05-23 NOTE — Progress Notes (Addendum)
PROGRESS NOTE        PATIENT DETAILS Name: Lynley Zachman Age: 72 y.o. Sex: female Date of Birth: 1951/10/06 Admit Date: 05/05/2023 Admitting Physician Meredeth Ide, MD ZOX:[id number], Odette Horns, MD  Brief Summary: Patient is a 72 y.o.  female with history of HTN, DM-2, PAD, CVA, depression/anxiety-who presented with acute hypoxic respiratory failure in the setting of pulm edema with worsening renal function.  Unfortunately-thought to have progressed to ESRD-requiring HD, for the hospital course complicated by development of acute metabolic encephalopathy, and acute calculus cholecystitis.    Significant events: 5/08>> admit to [facility]  Significant studies: 5/08>> CT chest/abdomen/pelvis: New bilateral pleural effusions, right> left parenchymal opacities 5/08>> renal ultrasound: No hydronephrosis 5/09>>Echo: EF 55-60% 5/12>> CT chest: Increasing interstitial/patchy groundglass opacities-decreasing small bilateral pleural effusions. 5/14>> gastric emptying study: Nondiagnostic due to emesis 5/24>> HIDA scan: Non- visualization of cystic duct  Significant microbiology data: 5/08>> COVID PCR: Negative 5/08>> blood culture: Negative 5/11>> pleural fluid culture: No growth 5/22>> blood culture: No growth  Procedures: 5/11>> thoracocentesis (pleural fluid cytology negative) 5/11>> nontunneled dialysis catheter by PCCM 5/20>> tunneled dialysis catheter by IR 5/26>> cholecystostomy tube by IR.  Consults: PCCM Nephrology Interventional radiology General surgery Neurology  Subjective: Seen earlier this morning-completely awake/alert-subsequently seen this afternoon after she came back from IR-sleeping but easily aroused.  Continues to have some RUQ pain.  Objective: Vitals: Blood pressure 107/73, pulse 72, temperature 98.5 F (36.9 C), temperature source Oral, resp. rate 15, height 5\' 5"  (1.651 m), weight 59.4 kg, SpO2 95 %.   Exam: Gen Exam:Alert  awake-not in any distress HEENT:atraumatic, normocephalic Chest: B/L clear to auscultation anteriorly CVS:S1S2 regular Abdomen: Soft-mildly tender in the RUQ area. Extremities:no edema Neurology: Non focal Skin: no rash  Pertinent Labs/Radiology:    Latest Ref Rng & Units 05/23/2023    3:54 AM 05/22/2023    3:08 AM 05/21/2023    4:05 AM  CBC  WBC 4.0 - 10.5 K/uL 23.3  20.7  20.5   Hemoglobin 12.0 - 15.0 g/dL 40.9  81.1  91.4   Hematocrit 36.0 - 46.0 % 32.2  31.1  30.0   Platelets 150 - 400 K/uL 340  322  272     Lab Results  Component Value Date   NA 133 (L) 05/23/2023   K 3.8 05/23/2023   CL 91 (L) 05/23/2023   CO2 21 (L) 05/23/2023      Assessment/Plan: AKI on CKD stage IIIb Likely hemodynamically mediated-suspicion that she may have progressed to ESRD Nephrology following and directing HD care/scheduled Continue to monitor for signs of renal recovery  Acute hypoxic respiratory failure Secondary to acute pulm edema in the setting of worsening renal function/pleural effusion.  (PNA ruled out) Volume being managed with HD-dose IV Lasix Currently improved-and on room air  Acute calculus cholecystitis  Had low-grade fever on 5/22 and subsequently worsening leukocytosis/RUQ pain on 5/24-HIDA scan positive-evaluated by general surgery-Plavix held-underwent cholecystostomy tube placement by IR on 5/26 Continue Zosyn.   Follow CBC  Acute metabolic encephalopathy Had significant worsening of mental status at the end of HD on 5/25-neuroimaging negative for CVA, LTM EEG negative for seizures.  Suspicion is for encephalopathy in the setting of cholecystitis.   Mental status has improved significantly on 5/26-hoping that she will continue to improve as she now has a cholecystostomy tube drain. Follow closely.  Hyponatremia Due  to impaired water excretion in the setting of worsening renal function Improving with ongoing volume removal with  hemodialysis.  Hypokalemia Repleted  Acute on chronic HFrEF Acute pulm edema Volume status stable with HD/IV Lasix  History of CAD No anginal symptoms Echo with stable EF Lasix held for cholecystostomy-resume 5/27 Continue beta-blocker/statin.  HTN BP stable on Coreg Follow/optimize  HLD Statin  DM-2 (A1c 7.7 on 4/18) CBGs stable on 15 units of Semglee daily, 3 units of NovoLog with meals and SSI  Recent Labs    05/22/23 2042 05/23/23 0824 05/23/23 1227  GLUCAP 176* 266* 164*     Subcentimeter left thyroid nodule Per radiology-not clinically significant-no follow-up recommendations.  BMI: Estimated body mass index is 21.78 kg/m as calculated from the following:   Height as of this encounter: 5\' 5"  (1.651 m).   Weight as of this encounter: 59.4 kg.   Code status:   Code Status: Full Code   DVT Prophylaxis: heparin injection 5,000 Units Start: 05/23/23 2200   Family Communication: Daughter at bedside.  Disposition Plan: Status is: Inpatient Remains inpatient appropriate because: Severity of illness   Planned Discharge Destination:Home   Diet: Diet Order             Diet regular Room service appropriate? Yes; Fluid consistency: Thin  Diet effective now                     Antimicrobial agents: Anti-infectives (From admission, onward)    Start     Dose/Rate Route Frequency Ordered Stop   05/21/23 1830  piperacillin-tazobactam (ZOSYN) IVPB 2.25 g        2.25 g 100 mL/hr over 30 Minutes Intravenous Every 8 hours 05/21/23 1740     05/17/23 1500  ceFAZolin (ANCEF) IVPB 2g/100 mL premix        2 g 200 mL/hr over 30 Minutes Intravenous To Radiology 05/16/23 1204 05/17/23 1537   05/08/23 1000  ceFAZolin (ANCEF) IVPB 2g/100 mL premix        2 g 200 mL/hr over 30 Minutes Intravenous To Radiology 05/07/23 1537 05/09/23 1000   05/05/23 1515  cefTRIAXone (ROCEPHIN) 1 g in sodium chloride 0.9 % 100 mL IVPB        1 g 200 mL/hr over 30 Minutes  Intravenous  Once 05/05/23 1510 05/05/23 1642   05/05/23 1515  azithromycin (ZITHROMAX) 500 mg in sodium chloride 0.9 % 250 mL IVPB        500 mg 250 mL/hr over 60 Minutes Intravenous  Once 05/05/23 1510 05/05/23 1750        MEDICATIONS: Scheduled Meds:  atorvastatin  80 mg Oral Daily   bisacodyl  10 mg Rectal Daily   carvedilol  6.25 mg Oral BID WC   Chlorhexidine Gluconate Cloth  6 each Topical Q0600   [START ON 05/24/2023] clopidogrel  75 mg Oral Daily   diphenhydrAMINE  25 mg Intravenous Once   docusate sodium  100 mg Oral BID   fluticasone  2 spray Each Nare Daily   guaiFENesin  600 mg Oral BID   heparin injection (subcutaneous)  5,000 Units Subcutaneous Q8H   insulin aspart  0-15 Units Subcutaneous TID WC   insulin aspart  0-5 Units Subcutaneous QHS   insulin aspart  3 Units Subcutaneous TID WC   insulin glargine-yfgn  15 Units Subcutaneous Daily   oxymetazoline  1 spray Each Nare Once   sodium chloride flush  3 mL Intravenous Q12H   Continuous Infusions:  sodium chloride Stopped (05/09/23 2241)   piperacillin-tazobactam (ZOSYN)  IV 2.25 g (05/23/23 0541)   PRN Meds:.sodium chloride, acetaminophen, albuterol, heparin, HYDROmorphone (DILAUDID) injection, iohexol, LORazepam, melatonin, ondansetron **OR** ondansetron (ZOFRAN) IV, mouth rinse, oxyCODONE, sodium chloride, sodium chloride flush   I have personally reviewed following labs and imaging studies  LABORATORY DATA: CBC: Recent Labs  Lab 05/19/23 1132 05/21/23 0405 05/22/23 0308 05/23/23 0354  WBC 12.8* 20.5* 20.7* 23.3*  HGB 10.9* 10.5* 10.7* 11.0*  HCT 32.0* 30.0* 31.1* 32.2*  MCV 90.9 91.2 91.5 94.7  PLT 291 272 322 340     Basic Metabolic Panel: Recent Labs  Lab 05/19/23 0355 05/20/23 0422 05/21/23 0405 05/22/23 0309 05/23/23 0354  NA 128* 130* 131* 131* 133*  K 3.1* 4.0 3.9 4.1 3.8  CL 92* 92* 93* 92* 91*  CO2 23 23 24  21* 21*  GLUCOSE 141* 209* 156* 182* 237*  BUN 38* 46* 31* 60* 46*   CREATININE 4.49* 4.90* 4.14* 5.87* 5.21*  CALCIUM 8.9 9.4 9.1 9.3 9.1  PHOS 5.0* 5.4* 5.3* 7.9* 7.0*     GFR: Estimated Creatinine Clearance: 9.2 mL/min (A) (by C-G formula based on SCr of 5.21 mg/dL (H)).  Liver Function Tests: Recent Labs  Lab 05/20/23 0422 05/21/23 0405 05/22/23 0308 05/22/23 0309 05/23/23 0354  AST  --  39 33  --  68*  ALT  --  18 16  --  24  ALKPHOS  --  91 100  --  153*  BILITOT  --  1.1 1.0  --  1.0  PROT  --  7.1 7.5  --  7.7  ALBUMIN 3.2* 3.0* 2.7* 2.8* 2.8*    No results for input(s): "LIPASE", "AMYLASE" in the last 168 hours. Recent Labs  Lab 05/22/23 1621  AMMONIA 15    Coagulation Profile: Recent Labs  Lab 05/17/23 0433  INR 1.1     Cardiac Enzymes: No results for input(s): "CKTOTAL", "CKMB", "CKMBINDEX", "TROPONINI" in the last 168 hours.  BNP (last 3 results) No results for input(s): "PROBNP" in the last 8760 hours.  Lipid Profile: No results for input(s): "CHOL", "HDL", "LDLCALC", "TRIG", "CHOLHDL", "LDLDIRECT" in the last 72 hours.  Thyroid Function Tests: No results for input(s): "TSH", "T4TOTAL", "FREET4", "T3FREE", "THYROIDAB" in the last 72 hours.  Anemia Panel: No results for input(s): "VITAMINB12", "FOLATE", "FERRITIN", "TIBC", "IRON", "RETICCTPCT" in the last 72 hours.  Urine analysis:    Component Value Date/Time   COLORURINE YELLOW 05/05/2023 0927   APPEARANCEUR CLEAR 05/05/2023 0927   LABSPEC 1.010 05/05/2023 0927   PHURINE 6.0 05/05/2023 0927   GLUCOSEU 50 (A) 05/05/2023 0927   HGBUR NEGATIVE 05/05/2023 0927   BILIRUBINUR NEGATIVE 05/05/2023 0927   BILIRUBINUR negative 08/18/2021 1207   BILIRUBINUR neg 06/28/2017 1508   KETONESUR NEGATIVE 05/05/2023 0927   PROTEINUR 100 (A) 05/05/2023 0927   UROBILINOGEN 0.2 08/18/2021 1207   UROBILINOGEN 0.2 01/15/2015 1032   NITRITE NEGATIVE 05/05/2023 0927   LEUKOCYTESUR NEGATIVE 05/05/2023 0927    Sepsis Labs: Lactic Acid, Venous    Component Value  Date/Time   LATICACIDVEN 2.22 (H) 01/10/2015 1339    MICROBIOLOGY: Recent Results (from the past 240 hour(s))  Culture, blood (Routine X 2) w Reflex to ID Panel     Status: None (Preliminary result)   Collection Time: 05/19/23  6:06 PM   Specimen: BLOOD LEFT ARM  Result Value Ref Range Status   Specimen Description BLOOD LEFT ARM  Final   Special Requests   Final  BOTTLES DRAWN AEROBIC AND ANAEROBIC Blood Culture adequate volume   Culture   Final    NO GROWTH 4 DAYS Performed at [facility], [address], [place], [place] [address]    Report Status PENDING  Incomplete  Culture, blood (Routine X 2) w Reflex to ID Panel     Status: None (Preliminary result)   Collection Time: 05/19/23  6:06 PM   Specimen: BLOOD LEFT ARM  Result Value Ref Range Status   Specimen Description BLOOD LEFT ARM  Final   Special Requests   Final    BOTTLES DRAWN AEROBIC AND ANAEROBIC Blood Culture adequate volume   Culture   Final    NO GROWTH 4 DAYS Performed at [facility], [address], [place], [place] [address]    Report Status PENDING  Incomplete    RADIOLOGY STUDIES/RESULTS: EEG adult  Result Date: 05/23/2023 Charlsie Quest, MD     05/23/2023  6:27 AM Patient Name: Sherren Lemos MRN: [id number] Epilepsy Attending: Charlsie Quest Referring Physician/Provider: Maretta Bees, MD Date: 05/22/2023 Duration: 27.33 mins Patient history: 72yo F with ams getting eeg to evaluate for seizure. Level of alertness: lethargic AEDs during EEG study: None Technical aspects: This EEG study was done with scalp electrodes positioned according to the 10-20 International system of electrode placement. Electrical activity was reviewed with band pass filter of 1-70Hz , sensitivity of 7 uV/mm, display speed of 66mm/sec with a 60Hz  notched filter applied as appropriate. EEG data were recorded continuously and digitally stored.  Video monitoring was available and reviewed as appropriate.  Description: No clear posterior dominant rhythm was seen. EEG showed continuous generalized 3 to 7 Hz theta-delta slowing. Intermittent generalized periodic discharges with triphasic morphology at 2 Hz were also noted. Physiologic photic driving was not seen during photic stimulation.  Hyperventilation was not performed.   ABNORMALITY - Periodic discharges with triphasic morphology, generalized ( GPDs) - Continuous slow, generalized IMPRESSION: This study showed generalized periodic discharges with triphasic morphology which is most likely secondary to toxic-metabolic causes. However, the frequency of 2hz  can rarely evolve into ictal-interictal continuum. Recommend long term monitoring for better evaluation. Additionally there is moderate diffuse encephalopathy. No seizures were seen throughout the recording. Charlsie Quest   MR BRAIN WO CONTRAST  Result Date: 05/22/2023 CLINICAL DATA:  Neuro deficit, acute, stroke suspected.  Aphasia. EXAM: MRI HEAD WITHOUT CONTRAST MRA HEAD WITHOUT CONTRAST TECHNIQUE: Multiplanar, multi-echo pulse sequences of the brain and surrounding structures were acquired without intravenous contrast. Angiographic images of the Circle of Willis were acquired using MRA technique without intravenous contrast. COMPARISON:  Head CT 05/22/2023. Head MRI and MRA 11/08/2021. Head and neck CTA 11/09/2021. FINDINGS: MRI HEAD FINDINGS Brain: There is a 3 mm focus of restricted diffusion peripherally in the posterior left cerebellar hemisphere consistent with an acute infarct. There are a few punctate foci of mildly increased signal on axial trace diffusion weighted imaging without corresponding reduced ADC in the right greater than left cerebral hemispheric white matter which may reflect subacute to chronic ischemia. A few chronic microhemorrhages are noted in the basal ganglia. There is a small chronic left cerebellar infarct with associated chronic blood products. T2 hyperintensities in the  cerebral white matter bilaterally are similar to the prior MRI and are nonspecific but compatible with mild chronic small vessel ischemic disease. Chronic lacunar infarcts are noted in the right basal ganglia, left thalamus, and deep cerebral white matter. No mass, midline shift, or extra-axial fluid collection  is identified. The ventricles and sulci are within normal limits for age. A partially empty sella is unchanged. Vascular: Major intracranial vascular flow voids are preserved. Skull and upper cervical spine: Unremarkable bone marrow signal. Sinuses/Orbits: Unremarkable orbits. No significant inflammatory changes in the paranasal sinuses. No significant mastoid fluid. Other: None. MRA HEAD FINDINGS Anterior circulation: The internal carotid arteries are patent from skull base to carotid termini with up to mild chronic right paraclinoid stenosis. ACAs and MCAs are patent without evidence of a proximal branch occlusion. There is only mild narrowing of the distal left M1 segment where there was previously a severe stenosis. There is a severe stenosis of the origin of the left M2 superior division. There is a new severe mid to distal right A2 stenosis. No aneurysm is identified. Posterior circulation: The included portions of the intracranial vertebral arteries are patent with the right being dominant and with chronic moderate to severe stenoses of the left V4 segment with suspicion for a left vertebral dissection raised on the 2022 studies. Patent bilateral PICA, right AICA, and bilateral SCA origins are visualized. 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 There is a fetal origin of both PCAs both PCAs are patent with moderate bilateral P2 stenoses. No aneurysm is identified. Anatomic variants: Hypoplastic right A1 segment. IMPRESSION: 1. Punctate acute left cerebellar infarct. 2. Chronic small  vessel ischemic disease with multiple chronic infarcts as above. 3. No large vessel occlusion. 4. Intracranial atherosclerosis including severe left M2 and right A2 stenoses and moderate left V4, basilar, and PCA stenoses. Electronically Signed   By: Sebastian Ache M.D.   On: 05/22/2023 15:48   MR ANGIO HEAD WO CONTRAST  Result Date: 05/22/2023 CLINICAL DATA:  Neuro deficit, acute, stroke suspected.  Aphasia. EXAM: MRI HEAD WITHOUT CONTRAST MRA HEAD WITHOUT CONTRAST TECHNIQUE: Multiplanar, multi-echo pulse sequences of the brain and surrounding structures were acquired without intravenous contrast. Angiographic images of the Circle of Willis were acquired using MRA technique without intravenous contrast. COMPARISON:  Head CT 05/22/2023. Head MRI and MRA 11/08/2021. Head and neck CTA 11/09/2021. FINDINGS: MRI HEAD FINDINGS Brain: There is a 3 mm focus of restricted diffusion peripherally in the posterior left cerebellar hemisphere consistent with an acute infarct. There are a few punctate foci of mildly increased signal on axial trace diffusion weighted imaging without corresponding reduced ADC in the right greater than left cerebral hemispheric white matter which may reflect subacute to chronic ischemia. A few chronic microhemorrhages are noted in the basal ganglia. There is a small chronic left cerebellar infarct with associated chronic blood products. T2 hyperintensities in the cerebral white matter bilaterally are similar to the prior MRI and are nonspecific but compatible with mild chronic small vessel ischemic disease. Chronic lacunar infarcts are noted in the right basal ganglia, left thalamus, and deep cerebral white matter. No mass, midline shift, or extra-axial fluid collection is identified. The ventricles and sulci are within normal limits for age. A partially empty sella is unchanged. Vascular: Major intracranial vascular flow voids are preserved. Skull and upper cervical spine: Unremarkable bone  marrow signal. Sinuses/Orbits: Unremarkable orbits. No significant inflammatory changes in the paranasal sinuses. No significant mastoid fluid. Other: None. MRA HEAD FINDINGS Anterior circulation: The internal carotid arteries are patent from skull base to carotid termini with up to mild chronic right paraclinoid stenosis. ACAs and MCAs are patent without evidence of a proximal branch occlusion. There is only  mild narrowing of the distal left M1 segment where there was previously a severe stenosis. There is a severe stenosis of the origin of the left M2 superior division. There is a new severe mid to distal right A2 stenosis. No aneurysm is identified. Posterior circulation: The included portions of the intracranial vertebral arteries are patent with the right being dominant and with chronic moderate to severe stenoses of the left V4 segment with suspicion for a left vertebral dissection raised on the 2022 studies. Patent bilateral PICA, right AICA, and bilateral SCA origins are visualized. 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 There is a fetal origin of both PCAs both PCAs are patent with moderate bilateral P2 stenoses. No aneurysm is identified. Anatomic variants: Hypoplastic right A1 segment. IMPRESSION: 1. Punctate acute left cerebellar infarct. 2. 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 3. No large vessel occlusion. 4. Intracranial atherosclerosis including severe left M2 and right A2 stenoses and moderate left V4, basilar, and PCA stenoses. Electronically Signed   By: Sebastian Ache M.D.   On: 05/22/2023 15:48   CT HEAD CODE STROKE WO CONTRAST  Result Date: 05/22/2023 CLINICAL DATA:  Code stroke. Neuro deficit, acute, stroke suspected. Aphasia. L3 mental status. EXAM: CT HEAD WITHOUT CONTRAST TECHNIQUE: Contiguous axial images were obtained from the  base of the skull through the vertex without intravenous contrast. RADIATION DOSE REDUCTION: This exam was performed according to the departmental dose-optimization program which includes automated exposure control, adjustment of the mA and/or kV according to patient size and/or use of iterative reconstruction technique. COMPARISON:  Head CT 04/14/2023 and MRI 11/08/2021 FINDINGS: Brain: There is no evidence of an acute infarct, intracranial hemorrhage, mass, midline shift, or extra-axial fluid collection. A chronic left cerebellar infarct and chronic bilateral basal ganglia lacunar infarcts are again noted. Mild hypodensities in the cerebral white matter bilaterally are unchanged and nonspecific but compatible with chronic small vessel ischemic disease. The ventricles and sulci are normal. Vascular: Calcified atherosclerosis at the skull base. No hyperdense vessel. Skull: No acute fracture or suspicious osseous lesion. Sinuses/Orbits: Minimal mucosal thickening in the paranasal sinuses. Clear mastoid air cells. Unremarkable orbits. Other: None. ASPECTS (Alberta Stroke Program Early CT Score) - Ganglionic level infarction (caudate, lentiform nuclei, internal capsule, insula, M1-M3 cortex): 7 - Supraganglionic infarction (M4-M6 cortex): 3 Total score (0-10 with 10 being normal): 10 These results were communicated to Dr. Wilford Corner at 1:31 pm on 05/22/2023 by text page via the [facility] messaging system. IMPRESSION: 1. No evidence of acute intracranial abnormality. ASPECTS of 10. 2. Chronic ischemia with old basal ganglia and cerebellar infarcts. Electronically Signed   By: Sebastian Ache M.D.   On: 05/22/2023 13:33   NM Hepatobiliary Liver Func  Result Date: 05/21/2023 CLINICAL DATA:  Gallstones. EXAM: NUCLEAR MEDICINE HEPATOBILIARY IMAGING TECHNIQUE: Sequential images of the abdomen were obtained out to 60 minutes following intravenous administration of radiopharmaceutical. RADIOPHARMACEUTICALS:  5.0 mCi Tc-68m  Choletec IV  COMPARISON:  CT 05/05/2023 FINDINGS: Prompt uptake and biliary excretion of activity by the liver is seen. Biliary activity passes into small bowel, consistent with patent common bile duct. No gallbladder activity identified after 120 minutes of sequential imaging. IMPRESSION: 1. Nonvisualization of the gallbladder after 120 minutes of imaging compatible with cholecystitis. 2. Normal biliary to bowel transit consistent with patent common bile duct. Electronically Signed   By: Signa Kell M.D.   On: 05/21/2023 17:23  LOS: 18 days   Jeoffrey Massed, MD  [facility]    To contact the attending provider between 7A-7P or the covering provider during after hours 7P-7A, please log into the web site [email or web address] and access using universal [facility] password for that web site. If you do not have the password, please call the hospital operator.  05/23/2023, 12:52 PM

## 2023-05-23 NOTE — Procedures (Signed)
Patient Name: Samantha [name]  MRN: [id number]  Epilepsy Attending: Charlsie Quest  Referring Physician/Provider: Maretta Bees, MD Date: 05/22/2023 Duration: 27.33 mins  Patient history: 72yo F with ams getting eeg to evaluate for seizure.  Level of alertness: lethargic   AEDs during EEG study: None  Technical aspects: This EEG study was done with scalp electrodes positioned according to the 10-20 International system of electrode placement. Electrical activity was reviewed with band pass filter of 1-70Hz , sensitivity of 7 uV/mm, display speed of 70mm/sec with a 60Hz  notched filter applied as appropriate. EEG data were recorded continuously and digitally stored.  Video monitoring was available and reviewed as appropriate.  Description: No clear posterior dominant rhythm was seen. EEG showed continuous generalized 3 to 7 Hz theta-delta slowing. Intermittent generalized periodic discharges with triphasic morphology at 2 Hz were also noted. Physiologic photic driving was not seen during photic stimulation.  Hyperventilation was not performed.     ABNORMALITY - Periodic discharges with triphasic morphology, generalized ( GPDs) - Continuous slow, generalized  IMPRESSION: This study showed generalized periodic discharges with triphasic morphology which is most likely secondary to toxic-metabolic causes. However, the frequency of 2hz  can rarely evolve into ictal-interictal continuum. Recommend long term monitoring for better evaluation.   Additionally there is moderate diffuse encephalopathy. No seizures were seen throughout the recording.  [name] Annabelle Harman

## 2023-05-23 NOTE — Progress Notes (Signed)
LTM EEG hooked up and running - no initial skin breakdown - push button tested - [facility] monitoring.  

## 2023-05-23 NOTE — Progress Notes (Signed)
[facility] NEPHROLOGY PROGRESS NOTE  Assessment/ Plan: Pt is a 72 y.o. yo female with a past medical history significant for hypertension, type II DM, PAD, CVA, HLD, CKD 3B who presented with shortness of breath and fluid overload.  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  OP HD arranged at [facility] [place] on TTS 11:30 chair time. Plan for PD later on. Last HD on 5/25 with 900 cc UF, plan for next dialysis on Tuesday.  # Acute hypoxic respiratory failure due to fluid overload: Received thoracocentesis and diuresis.  Now managing volume with HD.  # HTN/volume: BP acceptable.  No edema.  UF as tolerated.  Discontinue Lasix.  # Anemia: Hemoglobin at goal.  # Acute cholecystitis: HIDA scan was positive therefore on antibiotics.  Seen by general surgery and IR.  Planning cholecystostomy tube placement.  # Acute metabolic encephalopathy happened after HD: Seen by neurologist thought to be due to toxic metabolic encephalopathy.  No seizure  Subjective: Seen and examined at bedside.  No new event.  She is alert awake.  No chest pain or shortness of breath.  Objective Vital signs in last 24 hours: Vitals:   05/22/23 2130 05/23/23 0401 05/23/23 0500 05/23/23 0800  BP:  114/61  105/61  Pulse: 77 82  80  Resp: 15 19  10   Temp:  98.2 F (36.8 C)  (!) 97.5 F (36.4 C)  TempSrc:  Oral  Oral  SpO2: 93% 91%    Weight:   59.4 kg   Height:       Weight change: -1.9 kg  Intake/Output Summary (Last 24 hours) at 05/23/2023 1000 Last data filed at 05/23/2023 1610 Gross per 24 hour  Intake 3 ml  Output 900 ml  Net -897 ml        Labs: RENAL PANEL Recent Labs  Lab 05/19/23 0355 05/20/23 0422 05/21/23 0405 05/22/23 0308 05/22/23 0309 05/23/23 0354  NA 128* 130* 131*  --  131* 133*  K 3.1* 4.0 3.9  --  4.1 3.8  CL 92* 92* 93*  --  92* 91*  CO2 23 23  24   --  21* 21*  GLUCOSE 141* 209* 156*  --  182* 237*  BUN 38* 46* 31*  --  60* 46*  CREATININE 4.49* 4.90* 4.14*  --  5.87* 5.21*  CALCIUM 8.9 9.4 9.1  --  9.3 9.1  PHOS 5.0* 5.4* 5.3*  --  7.9* 7.0*  ALBUMIN 3.2* 3.2* 3.0* 2.7* 2.8* 2.8*     Liver Function Tests: Recent Labs  Lab 05/21/23 0405 05/22/23 0308 05/22/23 0309 05/23/23 0354  AST 39 33  --  68*  ALT 18 16  --  24  ALKPHOS 91 100  --  153*  BILITOT 1.1 1.0  --  1.0  PROT 7.1 7.5  --  7.7  ALBUMIN 3.0* 2.7* 2.8* 2.8*    No results for input(s): "LIPASE", "AMYLASE" in the last 168 hours. Recent Labs  Lab 05/22/23 1621  AMMONIA 15   CBC: Recent Labs    05/16/23 0325 05/19/23 1132 05/21/23 0405 05/22/23 0308 05/23/23 0354  HGB 11.0* 10.9* 10.5* 10.7* 11.0*  MCV 91.4 90.9 91.2 91.5 94.7     Cardiac Enzymes: No results for input(s): "CKTOTAL", "CKMB", "CKMBINDEX", "TROPONINI" in the last 168 hours. CBG: Recent Labs  Lab 05/22/23 0740 05/22/23 1238 05/22/23 1602 05/22/23 2042  05/23/23 0824  GLUCAP 233* 85 227* 176* 266*     Iron Studies: No results for input(s): "IRON", "TIBC", "TRANSFERRIN", "FERRITIN" in the last 72 hours. Studies/Results: EEG adult  Result Date: 05/23/2023 Charlsie Quest, MD     05/23/2023  6:27 AM Patient Name: Angilee Wooldridge MRN: [id number] Epilepsy Attending: Charlsie Quest Referring Physician/Provider: Maretta Bees, MD Date: 05/22/2023 Duration: 27.33 mins Patient history: 72yo F with ams getting eeg to evaluate for seizure. Level of alertness: lethargic AEDs during EEG study: None Technical aspects: This EEG study was done with scalp electrodes positioned according to the 10-20 International system of electrode placement. Electrical activity was reviewed with band pass filter of 1-70Hz , sensitivity of 7 uV/mm, display speed of 73mm/sec with a 60Hz  notched filter applied as appropriate. EEG data were recorded continuously and digitally stored.  Video monitoring was  available and reviewed as appropriate. Description: No clear posterior dominant rhythm was seen. EEG showed continuous generalized 3 to 7 Hz theta-delta slowing. Intermittent generalized periodic discharges with triphasic morphology at 2 Hz were also noted. Physiologic photic driving was not seen during photic stimulation.  Hyperventilation was not performed.   ABNORMALITY - Periodic discharges with triphasic morphology, generalized ( GPDs) - Continuous slow, generalized IMPRESSION: This study showed generalized periodic discharges with triphasic morphology which is most likely secondary to toxic-metabolic causes. However, the frequency of 2hz  can rarely evolve into ictal-interictal continuum. Recommend long term monitoring for better evaluation. Additionally there is moderate diffuse encephalopathy. No seizures were seen throughout the recording. Charlsie Quest   MR BRAIN WO CONTRAST  Result Date: 05/22/2023 CLINICAL DATA:  Neuro deficit, acute, stroke suspected.  Aphasia. EXAM: MRI HEAD WITHOUT CONTRAST MRA HEAD WITHOUT CONTRAST TECHNIQUE: Multiplanar, multi-echo pulse sequences of the brain and surrounding structures were acquired without intravenous contrast. Angiographic images of the Circle of Willis were acquired using MRA technique without intravenous contrast. COMPARISON:  Head CT 05/22/2023. Head MRI and MRA 11/08/2021. Head and neck CTA 11/09/2021. FINDINGS: MRI HEAD FINDINGS Brain: There is a 3 mm focus of restricted diffusion peripherally in the posterior left cerebellar hemisphere consistent with an acute infarct. There are a few punctate foci of mildly increased signal on axial trace diffusion weighted imaging without corresponding reduced ADC in the right greater than left cerebral hemispheric white matter which may reflect subacute to chronic ischemia. A few chronic microhemorrhages are noted in the basal ganglia. There is a small chronic left cerebellar infarct with associated chronic blood  products. T2 hyperintensities in the cerebral white matter bilaterally are similar to the prior MRI and are nonspecific but compatible with mild chronic small vessel ischemic disease. Chronic lacunar infarcts are noted in the right basal ganglia, left thalamus, and deep cerebral white matter. No mass, midline shift, or extra-axial fluid collection is identified. The ventricles and sulci are within normal limits for age. A partially empty sella is unchanged. Vascular: Major intracranial vascular flow voids are preserved. Skull and upper cervical spine: Unremarkable bone marrow signal. Sinuses/Orbits: Unremarkable orbits. No significant inflammatory changes in the paranasal sinuses. No significant mastoid fluid. Other: None. MRA HEAD FINDINGS Anterior circulation: The internal carotid arteries are patent from skull base to carotid termini with up to mild chronic right paraclinoid stenosis. ACAs and MCAs are patent without evidence of a proximal branch occlusion. There is only mild narrowing of the distal left M1 segment where there was previously a severe stenosis. There is a severe stenosis of the origin of the left M2 superior  division. There is a new severe mid to distal right A2 stenosis. No aneurysm is identified. Posterior circulation: The included portions of the intracranial vertebral arteries are patent with the right being dominant and with chronic moderate to severe stenoses of the left V4 segment with suspicion for a left vertebral dissection raised on the 2022 studies. Patent bilateral PICA, right AICA, and bilateral SCA origins are visualized. 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 There is a fetal origin of both PCAs both PCAs are patent with moderate bilateral P2 stenoses. No aneurysm is identified. Anatomic variants: Hypoplastic right A1 segment. IMPRESSION: 1. Punctate acute left  cerebellar infarct. 2. 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 3. No large vessel occlusion. 4. Intracranial atherosclerosis including severe left M2 and right A2 stenoses and moderate left V4, basilar, and PCA stenoses. Electronically Signed   By: Sebastian Ache M.D.   On: 05/22/2023 15:48   MR ANGIO HEAD WO CONTRAST  Result Date: 05/22/2023 CLINICAL DATA:  Neuro deficit, acute, stroke suspected.  Aphasia. EXAM: MRI HEAD WITHOUT CONTRAST MRA HEAD WITHOUT CONTRAST TECHNIQUE: Multiplanar, multi-echo pulse sequences of the brain and surrounding structures were acquired without intravenous contrast. Angiographic images of the Circle of Willis were acquired using MRA technique without intravenous contrast. COMPARISON:  Head CT 05/22/2023. Head MRI and MRA 11/08/2021. Head and neck CTA 11/09/2021. FINDINGS: MRI HEAD FINDINGS Brain: There is a 3 mm focus of restricted diffusion peripherally in the posterior left cerebellar hemisphere consistent with an acute infarct. There are a few punctate foci of mildly increased signal on axial trace diffusion weighted imaging without corresponding reduced ADC in the right greater than left cerebral hemispheric white matter which may reflect subacute to chronic ischemia. A few chronic microhemorrhages are noted in the basal ganglia. There is a small chronic left cerebellar infarct with associated chronic blood products. T2 hyperintensities in the cerebral white matter bilaterally are similar to the prior MRI and are nonspecific but compatible with mild chronic small vessel ischemic disease. Chronic lacunar infarcts are noted in the right basal ganglia, left thalamus, and deep cerebral white matter. No mass, midline shift, or extra-axial fluid collection is identified. The ventricles and sulci are within normal limits for age. A partially empty sella is unchanged. Vascular: Major intracranial vascular flow voids are preserved. Skull and upper  cervical spine: Unremarkable bone marrow signal. Sinuses/Orbits: Unremarkable orbits. No significant inflammatory changes in the paranasal sinuses. No significant mastoid fluid. Other: None. MRA HEAD FINDINGS Anterior circulation: The internal carotid arteries are patent from skull base to carotid termini with up to mild chronic right paraclinoid stenosis. ACAs and MCAs are patent without evidence of a proximal branch occlusion. There is only mild narrowing of the distal left M1 segment where there was previously a severe stenosis. There is a severe stenosis of the origin of the left M2 superior division. There is a new severe mid to distal right A2 stenosis. No aneurysm is identified. Posterior circulation: The included portions of the intracranial vertebral arteries are patent with the right being dominant and with chronic moderate to severe stenoses of the left V4 segment with suspicion for a left vertebral dissection raised on the 2022 studies. Patent bilateral PICA, right AICA, and bilateral SCA origins are visualized. 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 There is a  fetal origin of both PCAs both PCAs are patent with moderate bilateral P2 stenoses. No aneurysm is identified. Anatomic variants: Hypoplastic right A1 segment. IMPRESSION: 1. Punctate acute left cerebellar infarct. 2. 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 3. No large vessel occlusion. 4. Intracranial atherosclerosis including severe left M2 and right A2 stenoses and moderate left V4, basilar, and PCA stenoses. Electronically Signed   By: Sebastian Ache M.D.   On: 05/22/2023 15:48   CT HEAD CODE STROKE WO CONTRAST  Result Date: 05/22/2023 CLINICAL DATA:  Code stroke. Neuro deficit, acute, stroke suspected. Aphasia. L3 mental status. EXAM: CT HEAD WITHOUT CONTRAST TECHNIQUE: Contiguous  axial images were obtained from the base of the skull through the vertex without intravenous contrast. RADIATION DOSE REDUCTION: This exam was performed according to the departmental dose-optimization program which includes automated exposure control, adjustment of the mA and/or kV according to patient size and/or use of iterative reconstruction technique. COMPARISON:  Head CT 04/14/2023 and MRI 11/08/2021 FINDINGS: Brain: There is no evidence of an acute infarct, intracranial hemorrhage, mass, midline shift, or extra-axial fluid collection. A chronic left cerebellar infarct and chronic bilateral basal ganglia lacunar infarcts are again noted. Mild hypodensities in the cerebral white matter bilaterally are unchanged and nonspecific but compatible with chronic small vessel ischemic disease. The ventricles and sulci are normal. Vascular: Calcified atherosclerosis at the skull base. No hyperdense vessel. Skull: No acute fracture or suspicious osseous lesion. Sinuses/Orbits: Minimal mucosal thickening in the paranasal sinuses. Clear mastoid air cells. Unremarkable orbits. Other: None. ASPECTS (Alberta Stroke Program Early CT Score) - Ganglionic level infarction (caudate, lentiform nuclei, internal capsule, insula, M1-M3 cortex): 7 - Supraganglionic infarction (M4-M6 cortex): 3 Total score (0-10 with 10 being normal): 10 These results were communicated to Dr. Wilford Corner at 1:31 pm on 05/22/2023 by text page via the [facility] messaging system. IMPRESSION: 1. No evidence of acute intracranial abnormality. ASPECTS of 10. 2. Chronic ischemia with old basal ganglia and cerebellar infarcts. Electronically Signed   By: Sebastian Ache M.D.   On: 05/22/2023 13:33   NM Hepatobiliary Liver Func  Result Date: 05/21/2023 CLINICAL DATA:  Gallstones. EXAM: NUCLEAR MEDICINE HEPATOBILIARY IMAGING TECHNIQUE: Sequential images of the abdomen were obtained out to 60 minutes following intravenous administration of radiopharmaceutical.  RADIOPHARMACEUTICALS:  5.0 mCi Tc-26m  Choletec IV COMPARISON:  CT 05/05/2023 FINDINGS: Prompt uptake and biliary excretion of activity by the liver is seen. Biliary activity passes into small bowel, consistent with patent common bile duct. No gallbladder activity identified after 120 minutes of sequential imaging. IMPRESSION: 1. Nonvisualization of the gallbladder after 120 minutes of imaging compatible with cholecystitis. 2. Normal biliary to bowel transit consistent with patent common bile duct. Electronically Signed   By: Signa Kell M.D.   On: 05/21/2023 17:23    Medications: Infusions:  sodium chloride Stopped (05/09/23 2241)   piperacillin-tazobactam (ZOSYN)  IV 2.25 g (05/23/23 0541)    Scheduled Medications:  atorvastatin  80 mg Oral Daily   bisacodyl  10 mg Rectal Daily   carvedilol  6.25 mg Oral BID WC   Chlorhexidine Gluconate Cloth  6 each Topical Q0600   diphenhydrAMINE  25 mg Intravenous Once   docusate sodium  100 mg Oral BID   fluticasone  2 spray Each Nare Daily   guaiFENesin  600 mg Oral BID   heparin  5,000 Units Subcutaneous Q8H   insulin aspart  0-15 Units Subcutaneous TID WC   insulin aspart  0-5 Units Subcutaneous  QHS   insulin aspart  3 Units Subcutaneous TID WC   insulin glargine-yfgn  15 Units Subcutaneous Daily   oxymetazoline  1 spray Each Nare Once   sodium chloride flush  3 mL Intravenous Q12H    have reviewed scheduled and prn medications.  Physical Exam: General:NAD, comfortable Heart:RRR, s1s2 nl Lungs:clear b/l, no crackle Abdomen:soft, mild tenderness, not distended. Extremities:No edema Dialysis Access: Dialysis HD catheter in place.  Staley Lunz Prasad Haylie Mccutcheon 05/23/2023,10:00 AM  LOS: 18 days

## 2023-05-23 NOTE — Procedures (Addendum)
Patient Name: Samantha [name]  MRN: [id number]  Epilepsy Attending: Charlsie Quest  Referring Physician/Provider: Maretta Bees, MD Duration: 05/23/2023 7829 to 5621   Patient history: 72yo F with ams getting eeg to evaluate for seizure.    Level of alertness: lethargic    AEDs during EEG study: None   Technical aspects: This EEG study was done with scalp electrodes positioned according to the 10-20 International system of electrode placement. Electrical activity was reviewed with band pass filter of 1-70Hz , sensitivity of 7 uV/mm, display speed of 81mm/sec with a 60Hz  notched filter applied as appropriate. EEG data were recorded continuously and digitally stored.  Video monitoring was available and reviewed as appropriate.   Description: The posterior dominant rhythm consists of 8 Hz activity of moderate voltage (25-35 uV) seen predominantly in posterior head regions, symmetric and reactive to eye opening and eye closing. Sleep was characterized by sleep spindles (12 to 14 Hz), maximal frontocentral region.  EEG showed continuous generalized 3 to 7 Hz theta-delta slowing. Intermittent generalized periodic discharges with triphasic morphology were also noted with fluctuating frequency of 1.5-2hz , more frequent when awake/ stimulated. Hyperventilation and photic stimulation were not performed.      ABNORMALITY - Periodic discharges with triphasic morphology, generalized ( GPDs) - Continuous slow, generalized   IMPRESSION: This study showed generalized periodic discharges with triphasic morphology which is most likely secondary to toxic-metabolic causes. Additionally there is moderate diffuse encephalopathy. No seizures were seen throughout the recording.   Samantha [name] Samantha [name]

## 2023-05-23 NOTE — Progress Notes (Addendum)
Subjective/Chief Complaint: Complains of abd pain   Objective: Vital signs in last 24 hours: Temp:  [97.5 F (36.4 C)-98.4 F (36.9 C)] 97.5 F (36.4 C) (05/26 0800) Pulse Rate:  [77-87] 80 (05/26 0800) Resp:  [10-25] 10 (05/26 0800) BP: (92-150)/(59-100) 105/61 (05/26 0800) SpO2:  [90 %-96 %] 91 % (05/26 0401) Weight:  [59.4 kg] 59.4 kg (05/26 0500) Last BM Date : 05/22/23  Intake/Output from previous day: 05/25 0701 - 05/26 0700 In: 3 [I.V.:3] Out: 900  Intake/Output this shift: Total I/O In: 3 [I.V.:3] Out: -   General appearance: alert and cooperative Resp: clear to auscultation bilaterally Cardio: regular rate and rhythm GI: soft, mild tenderness on right  Lab Results:  Recent Labs    05/22/23 0308 05/23/23 0354  WBC 20.7* 23.3*  HGB 10.7* 11.0*  HCT 31.1* 32.2*  PLT 322 340   BMET Recent Labs    05/22/23 0309 05/23/23 0354  NA 131* 133*  K 4.1 3.8  CL 92* 91*  CO2 21* 21*  GLUCOSE 182* 237*  BUN 60* 46*  CREATININE 5.87* 5.21*  CALCIUM 9.3 9.1   PT/INR No results for input(s): "LABPROT", "INR" in the last 72 hours. ABG Recent Labs    05/22/23 1436  PHART 7.54*  HCO3 24.8    Studies/Results: EEG adult  Result Date: 05/23/2023 Charlsie Quest, MD     05/23/2023  6:27 AM Patient Name: Samantha [name] MRN: [id number] Epilepsy Attending: Charlsie Quest Referring Physician/Provider: Maretta Bees, MD Date: 05/22/2023 Duration: 27.33 mins Patient history: 72yo F with ams getting eeg to evaluate for seizure. Level of alertness: lethargic AEDs during EEG study: None Technical aspects: This EEG study was done with scalp electrodes positioned according to the 10-20 International system of electrode placement. Electrical activity was reviewed with band pass filter of 1-70Hz , sensitivity of 7 uV/mm, display speed of 37mm/sec with a 60Hz  notched filter applied as appropriate. EEG data were recorded continuously and digitally stored.  Video  monitoring was available and reviewed as appropriate. Description: No clear posterior dominant rhythm was seen. EEG showed continuous generalized 3 to 7 Hz theta-delta slowing. Intermittent generalized periodic discharges with triphasic morphology at 2 Hz were also noted. Physiologic photic driving was not seen during photic stimulation.  Hyperventilation was not performed.   ABNORMALITY - Periodic discharges with triphasic morphology, generalized ( GPDs) - Continuous slow, generalized IMPRESSION: This study showed generalized periodic discharges with triphasic morphology which is most likely secondary to toxic-metabolic causes. However, the frequency of 2hz  can rarely evolve into ictal-interictal continuum. Recommend long term monitoring for better evaluation. Additionally there is moderate diffuse encephalopathy. No seizures were seen throughout the recording. Charlsie Quest   MR BRAIN WO CONTRAST  Result Date: 05/22/2023 CLINICAL DATA:  Neuro deficit, acute, stroke suspected.  Aphasia. EXAM: MRI HEAD WITHOUT CONTRAST MRA HEAD WITHOUT CONTRAST TECHNIQUE: Multiplanar, multi-echo pulse sequences of the brain and surrounding structures were acquired without intravenous contrast. Angiographic images of the Circle of Willis were acquired using MRA technique without intravenous contrast. COMPARISON:  Head CT 05/22/2023. Head MRI and MRA 11/08/2021. Head and neck CTA 11/09/2021. FINDINGS: MRI HEAD FINDINGS Brain: There is a 3 mm focus of restricted diffusion peripherally in the posterior left cerebellar hemisphere consistent with an acute infarct. There are a few punctate foci of mildly increased signal on axial trace diffusion weighted imaging without corresponding reduced ADC in the right greater than left cerebral hemispheric white matter which may reflect subacute to chronic  ischemia. A few chronic microhemorrhages are noted in the basal ganglia. There is a small chronic left cerebellar infarct with associated  chronic blood products. T2 hyperintensities in the cerebral white matter bilaterally are similar to the prior MRI and are nonspecific but compatible with mild chronic small vessel ischemic disease. Chronic lacunar infarcts are noted in the right basal ganglia, left thalamus, and deep cerebral white matter. No mass, midline shift, or extra-axial fluid collection is identified. The ventricles and sulci are within normal limits for age. A partially empty sella is unchanged. Vascular: Major intracranial vascular flow voids are preserved. Skull and upper cervical spine: Unremarkable bone marrow signal. Sinuses/Orbits: Unremarkable orbits. No significant inflammatory changes in the paranasal sinuses. No significant mastoid fluid. Other: None. MRA HEAD FINDINGS Anterior circulation: The internal carotid arteries are patent from skull base to carotid termini with up to mild chronic right paraclinoid stenosis. ACAs and MCAs are patent without evidence of a proximal branch occlusion. There is only mild narrowing of the distal left M1 segment where there was previously a severe stenosis. There is a severe stenosis of the origin of the left M2 superior division. There is a new severe mid to distal right A2 stenosis. No aneurysm is identified. Posterior circulation: The included portions of the intracranial vertebral arteries are patent with the right being dominant and with chronic moderate to severe stenoses of the left V4 segment with suspicion for a left vertebral dissection raised on the 2022 studies. Patent bilateral PICA, right AICA, and bilateral SCA origins are visualized. 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 There is a fetal origin of both PCAs both PCAs are patent with moderate bilateral P2 stenoses. No aneurysm is identified. Anatomic variants: Hypoplastic right A1 segment. IMPRESSION: 1.  Punctate acute left cerebellar infarct. 2. 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 3. No large vessel occlusion. 4. Intracranial atherosclerosis including severe left M2 and right A2 stenoses and moderate left V4, basilar, and PCA stenoses. Electronically Signed   By: Sebastian Ache M.D.   On: 05/22/2023 15:48   MR ANGIO HEAD WO CONTRAST  Result Date: 05/22/2023 CLINICAL DATA:  Neuro deficit, acute, stroke suspected.  Aphasia. EXAM: MRI HEAD WITHOUT CONTRAST MRA HEAD WITHOUT CONTRAST TECHNIQUE: Multiplanar, multi-echo pulse sequences of the brain and surrounding structures were acquired without intravenous contrast. Angiographic images of the Circle of Willis were acquired using MRA technique without intravenous contrast. COMPARISON:  Head CT 05/22/2023. Head MRI and MRA 11/08/2021. Head and neck CTA 11/09/2021. FINDINGS: MRI HEAD FINDINGS Brain: There is a 3 mm focus of restricted diffusion peripherally in the posterior left cerebellar hemisphere consistent with an acute infarct. There are a few punctate foci of mildly increased signal on axial trace diffusion weighted imaging without corresponding reduced ADC in the right greater than left cerebral hemispheric white matter which may reflect subacute to chronic ischemia. A few chronic microhemorrhages are noted in the basal ganglia. There is a small chronic left cerebellar infarct with associated chronic blood products. T2 hyperintensities in the cerebral white matter bilaterally are similar to the prior MRI and are nonspecific but compatible with mild chronic small vessel ischemic disease. Chronic lacunar infarcts are noted in the right basal ganglia, left thalamus, and deep cerebral white matter. No mass, midline shift, or extra-axial fluid collection is identified. The ventricles and sulci are within normal limits for age. A partially empty sella is  unchanged. Vascular: Major intracranial vascular flow voids are  preserved. Skull and upper cervical spine: Unremarkable bone marrow signal. Sinuses/Orbits: Unremarkable orbits. No significant inflammatory changes in the paranasal sinuses. No significant mastoid fluid. Other: None. MRA HEAD FINDINGS Anterior circulation: The internal carotid arteries are patent from skull base to carotid termini with up to mild chronic right paraclinoid stenosis. ACAs and MCAs are patent without evidence of a proximal branch occlusion. There is only mild narrowing of the distal left M1 segment where there was previously a severe stenosis. There is a severe stenosis of the origin of the left M2 superior division. There is a new severe mid to distal right A2 stenosis. No aneurysm is identified. Posterior circulation: The included portions of the intracranial vertebral arteries are patent with the right being dominant and with chronic moderate to severe stenoses of the left V4 segment with suspicion for a left vertebral dissection raised on the 2022 studies. Patent bilateral PICA, right AICA, and bilateral SCA origins are visualized. 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 There is a fetal origin of both PCAs both PCAs are patent with moderate bilateral P2 stenoses. No aneurysm is identified. Anatomic variants: Hypoplastic right A1 segment. IMPRESSION: 1. Punctate acute left cerebellar infarct. 2. 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 3. No large vessel occlusion. 4. Intracranial atherosclerosis including severe left M2 and right A2 stenoses and moderate left V4, basilar, and PCA stenoses. Electronically Signed   By: Sebastian Ache M.D.   On: 05/22/2023 15:48   CT HEAD CODE STROKE WO CONTRAST  Result Date: 05/22/2023 CLINICAL DATA:  Code stroke. Neuro deficit, acute, stroke suspected. Aphasia. L3 mental status. EXAM: CT HEAD WITHOUT  CONTRAST TECHNIQUE: Contiguous axial images were obtained from the base of the skull through the vertex without intravenous contrast. RADIATION DOSE REDUCTION: This exam was performed according to the departmental dose-optimization program which includes automated exposure control, adjustment of the mA and/or kV according to patient size and/or use of iterative reconstruction technique. COMPARISON:  Head CT 04/14/2023 and MRI 11/08/2021 FINDINGS: Brain: There is no evidence of an acute infarct, intracranial hemorrhage, mass, midline shift, or extra-axial fluid collection. A chronic left cerebellar infarct and chronic bilateral basal ganglia lacunar infarcts are again noted. Mild hypodensities in the cerebral white matter bilaterally are unchanged and nonspecific but compatible with chronic small vessel ischemic disease. The ventricles and sulci are normal. Vascular: Calcified atherosclerosis at the skull base. No hyperdense vessel. Skull: No acute fracture or suspicious osseous lesion. Sinuses/Orbits: Minimal mucosal thickening in the paranasal sinuses. Clear mastoid air cells. Unremarkable orbits. Other: None. ASPECTS ([place] Stroke Program Early CT Score) - Ganglionic level infarction (caudate, lentiform nuclei, internal capsule, insula, M1-M3 cortex): 7 - Supraganglionic infarction (M4-M6 cortex): 3 Total score (0-10 with 10 being normal): 10 These results were communicated to Dr. Wilford Corner at 1:31 pm on 05/22/2023 by text page via the [facility] messaging system. IMPRESSION: 1. No evidence of acute intracranial abnormality. ASPECTS of 10. 2. Chronic ischemia with old basal ganglia and cerebellar infarcts. Electronically Signed   By: Sebastian Ache M.D.   On: 05/22/2023 13:33   NM Hepatobiliary Liver Func  Result Date: 05/21/2023 CLINICAL DATA:  Gallstones. EXAM: NUCLEAR MEDICINE HEPATOBILIARY IMAGING TECHNIQUE: Sequential images of the abdomen were obtained out to 60 minutes following intravenous administration of  radiopharmaceutical. RADIOPHARMACEUTICALS:  5.0 mCi Tc-49m  Choletec IV COMPARISON:  CT  05/05/2023 FINDINGS: Prompt uptake and biliary excretion of activity by the liver is seen. Biliary activity passes into small bowel, consistent with patent common bile duct. No gallbladder activity identified after 120 minutes of sequential imaging. IMPRESSION: 1. Nonvisualization of the gallbladder after 120 minutes of imaging compatible with cholecystitis. 2. Normal biliary to bowel transit consistent with patent common bile duct. Electronically Signed   By: Signa Kell M.D.   On: 05/21/2023 17:23    Anti-infectives: Anti-infectives (From admission, onward)    Start     Dose/Rate Route Frequency Ordered Stop   05/21/23 1830  piperacillin-tazobactam (ZOSYN) IVPB 2.25 g        2.25 g 100 mL/hr over 30 Minutes Intravenous Every 8 hours 05/21/23 1740     05/17/23 1500  ceFAZolin (ANCEF) IVPB 2g/100 mL premix        2 g 200 mL/hr over 30 Minutes Intravenous To Radiology 05/16/23 1204 05/17/23 1537   05/08/23 1000  ceFAZolin (ANCEF) IVPB 2g/100 mL premix        2 g 200 mL/hr over 30 Minutes Intravenous To Radiology 05/07/23 1537 05/09/23 1000   05/05/23 1515  cefTRIAXone (ROCEPHIN) 1 g in sodium chloride 0.9 % 100 mL IVPB        1 g 200 mL/hr over 30 Minutes Intravenous  Once 05/05/23 1510 05/05/23 1642   05/05/23 1515  azithromycin (ZITHROMAX) 500 mg in sodium chloride 0.9 % 250 mL IVPB        500 mg 250 mL/hr over 60 Minutes Intravenous  Once 05/05/23 1510 05/05/23 1750       Assessment/Plan: s/p * No surgery found * Wbc elevating. Continue abx Plan for perc chole drain today by IR for cholecystitis Will follow  LOS: 18 days    Chevis Pretty III 05/23/2023

## 2023-05-23 NOTE — Procedures (Signed)
Interventional Radiology Procedure Note  Procedure: Percutaneous cholecystostomy tube placement.  Complications: None  Estimated Blood Loss: None  Recommendations: - Routine tube care   Signed,  Sterling Big, MD

## 2023-05-23 NOTE — Consult Note (Signed)
Chief Complaint: Patient was seen in consultation today for acute cholecystitis  Referring Physician(s): Dr. Jeoffrey Massed  Supervising Physician: Malachy Moan  Patient Status: [facility] - In-pt  History of Present Illness: Samantha [name] is a 72 y.o. female with past medical history of HTN, DM, PAD requiring vascular intervention, HFrEF, ESRD on HD who has been admitted for 2.5 weeks for management of her multiple medical conditions.  Despite interventions for her acute respiratory distress, fluid overload, and new HD dependence she developed a low-grade fever 5/22 with worsening leukocytosis. Abdominal US showed concern for acute cholecystitis.  She underwent a HIDA scan which demonstrated a non-filling gall bladder. Surgery was consulted and has recommended percutaneous cholecystostomy tube placement.  Case reviewed by Dr. Archer Asa who approves patient for drain placement.   PA met with patient and her daughter at bedside.  Case was anticipated yesterday, however patient requiring regular dialysis in the morning and then subsequently developed acute confusion throughout the afternoon.  She is alert and conversant during my visit today.  She is agreeable to drain placement.  Her daughter at bedside is advised of the risks, benefits, and likely longevity of the tube.  She is agreeable.   Past Medical History:  Diagnosis Date   Acute pyelonephritis    Bacteremia, escherichia coli 01/12/2015   Chest pain    Critical lower limb ischemia (HCC)    Hyperlipidemia    Hypertension    Left carotid bruit    PAD (peripheral artery disease) (HCC)    Type II diabetes mellitus (HCC)     Past Surgical History:  Procedure Laterality Date   ABDOMINAL ANGIOGRAM  05/16/2015   Procedure: Abdominal Angiogram;  Surgeon: Runell Gess, MD;  Location: [facility] INVASIVE CV LAB;  Service: Cardiovascular;;   ABDOMINAL HYSTERECTOMY  ~ 2000   IR FLUORO GUIDE CV LINE RIGHT  05/17/2023   IR US GUIDE  VASC ACCESS RIGHT  05/17/2023   PERIPHERAL VASCULAR CATHETERIZATION Bilateral 05/16/2015   Procedure: Lower Extremity Angiography;  Surgeon: Runell Gess, MD; renal arteries widely patent, L-SFA 75%, L-pop 95%, L-peroneal 90%; R-SFA 40%, R-pop 50%, R-prox peroneal 90%; directional atherectomy L-SFA, p-pop, drug-eluting balloon angioplasty reducing the stenoses to 0, small linear dissection was not flow limiting       PERIPHERAL VASCULAR CATHETERIZATION Left 05/16/2015   Procedure: Peripheral Vascular Atherectomy;  Surgeon: Runell Gess, MD;  Location: [facility] INVASIVE CV LAB;  Service: Cardiovascular;  Laterality: Left;  sfa    Allergies: Chicken allergy and Perflutren  Medications: Prior to Admission medications   Medication Sig Start Date End Date Taking? Authorizing Provider  amLODipine (NORVASC) 10 MG tablet Take 1 tablet (10 mg total) by mouth daily. 04/12/23  Yes Hoy Register, MD  atorvastatin (LIPITOR) 80 MG tablet Take 1 tablet (80 mg total) by mouth daily. 04/12/23  Yes Hoy Register, MD  cephALEXin (KEFLEX) 500 MG capsule Take 500 mg by mouth 2 (two) times daily.   Yes [provider]  clopidogrel (PLAVIX) 75 MG tablet Take 1 tablet (75 mg total) by mouth daily. 04/12/23  Yes Hoy Register, MD  diltiazem (CARDIZEM SR) 120 MG 12 hr capsule Take 120 mg by mouth daily.   Yes [provider]  furosemide (LASIX) 40 MG tablet Take 1 tablet (40 mg total) by mouth daily for 7 days. 04/18/23 05/05/23 Yes Burnadette Pop, MD  glimepiride (AMARYL) 4 MG tablet Take 1 tablet (4 mg total) by mouth daily with breakfast. 04/12/23  Yes Hoy Register, MD  hydrALAZINE (APRESOLINE) 50 MG tablet Take 1 tablet (50 mg total) by mouth every 8 (eight) hours. 04/18/23  Yes Burnadette Pop, MD  sodium bicarbonate 650 MG tablet Take 1 tablet (650 mg total) by mouth 3 (three) times daily. 04/18/23  Yes Burnadette Pop, MD  glucose blood test strip Use 3 times daily before meals 07/21/18   Hoy Register, MD  insulin isophane & regular human KwikPen (HUMULIN 70/30 KWIKPEN) (70-30) 100 UNIT/ML KwikPen Inject 30 Units into the skin in the morning AND 25 Units every evening. Patient taking differently: Inject 30 Units into the skin in the morning AND 26 Units every evening. 04/12/23   Hoy Register, MD  Insulin Syringe-Needle U-100 (BD INSULIN SYRINGE ULTRAFINE) 31G X 15/64" 0.5 ML MISC Use subcutaneously twice daily 01/28/17   Hoy Register, MD  TRUEPLUS LANCETS 28G MISC 1 each by Does not apply route 3 (three) times daily before meals. 12/23/16   Hoy Register, MD     Family History  Problem Relation Age of Onset   Hypertension Mother     Social History   Socioeconomic History   Marital status: Widowed    Spouse name: Not on file   Number of children: Not on file   Years of education: Not on file   Highest education level: Not on file  Occupational History   Not on file  Tobacco Use   Smoking status: Never   Smokeless tobacco: Never  Substance and Sexual Activity   Alcohol use: Yes    Alcohol/week: 1.0 - 2.0 standard drink of alcohol    Types: 1 - 2 Cans of beer per week    Comment: occas   Drug use: No   Sexual activity: Never  Other Topics Concern   Not on file  Social History Narrative   Not on file   Social Determinants of Health   Financial Resource Strain: Not on file  Food Insecurity: No Food Insecurity (05/05/2023)   Hunger Vital Sign    Worried About Running Out of Food in the Last Year: Never true    Ran Out of Food in the Last Year: Never true  Transportation Needs: No Transportation Needs (05/05/2023)   PRAPARE - Administrator, Civil Service (Medical): No    Lack of Transportation (Non-Medical): No  Physical Activity: Not on file  Stress: Not on file  Social Connections: Not on file     Review of Systems: A 12 point ROS discussed and pertinent positives are indicated in the HPI above.  All other systems are negative.  Review of  Systems  Unable to perform ROS: Mental status change    Vital Signs: BP 105/61 (BP Location: Right Arm)   Pulse 80   Temp (!) 97.5 F (36.4 C) (Oral)   Resp 10   Ht 5\' 5"  (1.651 m)   Wt 130 lb 13.8 oz (59.4 kg)   SpO2 91%   BMI 21.78 kg/m   Physical Exam Vitals and nursing note reviewed.  Constitutional:      General: She is not in acute distress.    Appearance: She is well-developed. She is not ill-appearing.  Cardiovascular:     Rate and Rhythm: Normal rate and regular rhythm.  Pulmonary:     Effort: Pulmonary effort is normal.     Breath sounds: Normal breath sounds.  Skin:    General: Skin is warm.  Neurological:     General: No focal deficit present.     Mental Status:  She is alert.  Psychiatric:        Mood and Affect: Mood normal.        Behavior: Behavior normal.      MD Evaluation Airway: WNL Heart: WNL Abdomen: WNL Chest/ Lungs: WNL ASA  Classification: 3 Mallampati/Airway Score: Two   Imaging: EEG adult  Result Date: 05/23/2023 Samantha Quest, MD     05/23/2023  6:27 AM Patient Name: Samantha [name] MRN: [id number] Epilepsy Attending: Charlsie [name] Referring Physician/Provider: Maretta Bees, MD Date: 05/22/2023 Duration: 27.33 mins Patient history: 72yo F with ams getting eeg to evaluate for seizure. Level of alertness: lethargic AEDs during EEG study: None Technical aspects: This EEG study was done with scalp electrodes positioned according to the 10-20 International system of electrode placement. Electrical activity was reviewed with band pass filter of 1-70Hz , sensitivity of 7 uV/mm, display speed of 21mm/sec with a 60Hz  notched filter applied as appropriate. EEG data were recorded continuously and digitally stored.  Video monitoring was available and reviewed as appropriate. Description: No clear posterior dominant rhythm was seen. EEG showed continuous generalized 3 to 7 Hz theta-delta slowing. Intermittent generalized periodic discharges  with triphasic morphology at 2 Hz were also noted. Physiologic photic driving was not seen during photic stimulation.  Hyperventilation was not performed.   ABNORMALITY - Periodic discharges with triphasic morphology, generalized ( GPDs) - Continuous slow, generalized IMPRESSION: This study showed generalized periodic discharges with triphasic morphology which is most likely secondary to toxic-metabolic causes. However, the frequency of 2hz  can rarely evolve into ictal-interictal continuum. Recommend long term monitoring for better evaluation. Additionally there is moderate diffuse encephalopathy. No seizures were seen throughout the recording. Samantha [name]   MR BRAIN WO CONTRAST  Result Date: 05/22/2023 CLINICAL DATA:  Neuro deficit, acute, stroke suspected.  Aphasia. EXAM: MRI HEAD WITHOUT CONTRAST MRA HEAD WITHOUT CONTRAST TECHNIQUE: Multiplanar, multi-echo pulse sequences of the brain and surrounding structures were acquired without intravenous contrast. Angiographic images of the Circle of Willis were acquired using MRA technique without intravenous contrast. COMPARISON:  Head CT 05/22/2023. Head MRI and MRA 11/08/2021. Head and neck CTA 11/09/2021. FINDINGS: MRI HEAD FINDINGS Brain: There is a 3 mm focus of restricted diffusion peripherally in the posterior left cerebellar hemisphere consistent with an acute infarct. There are a few punctate foci of mildly increased signal on axial trace diffusion weighted imaging without corresponding reduced ADC in the right greater than left cerebral hemispheric white matter which may reflect subacute to chronic ischemia. A few chronic microhemorrhages are noted in the basal ganglia. There is a small chronic left cerebellar infarct with associated chronic blood products. T2 hyperintensities in the cerebral white matter bilaterally are similar to the prior MRI and are nonspecific but compatible with mild chronic small vessel ischemic disease. Chronic lacunar infarcts  are noted in the right basal ganglia, left thalamus, and deep cerebral white matter. No mass, midline shift, or extra-axial fluid collection is identified. The ventricles and sulci are within normal limits for age. A partially empty sella is unchanged. Vascular: Major intracranial vascular flow voids are preserved. Skull and upper cervical spine: Unremarkable bone marrow signal. Sinuses/Orbits: Unremarkable orbits. No significant inflammatory changes in the paranasal sinuses. No significant mastoid fluid. Other: None. MRA HEAD FINDINGS Anterior circulation: The internal carotid arteries are patent from skull base to carotid termini with up to mild chronic right paraclinoid stenosis. ACAs and MCAs are patent without evidence of a proximal branch occlusion. There is only mild narrowing of the  distal left M1 segment where there was previously a severe stenosis. There is a severe stenosis of the origin of the left M2 superior division. There is a new severe mid to distal right A2 stenosis. No aneurysm is identified. Posterior circulation: The included portions of the intracranial vertebral arteries are patent with the right being dominant and with chronic moderate to severe stenoses of the left V4 segment with suspicion for a left vertebral dissection raised on the 2022 studies. Patent bilateral PICA, right AICA, and bilateral SCA origins are visualized. 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 There is a fetal origin of both PCAs both PCAs are patent with moderate bilateral P2 stenoses. No aneurysm is identified. Anatomic variants: Hypoplastic right A1 segment. IMPRESSION: 1. Punctate acute left cerebellar infarct. 2. 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 3. No large vessel occlusion. 4. Intracranial atherosclerosis including severe left M2 and right A2 stenoses  and moderate left V4, basilar, and PCA stenoses. Electronically Signed   By: Sebastian Ache M.D.   On: 05/22/2023 15:48   MR ANGIO HEAD WO CONTRAST  Result Date: 05/22/2023 CLINICAL DATA:  Neuro deficit, acute, stroke suspected.  Aphasia. EXAM: MRI HEAD WITHOUT CONTRAST MRA HEAD WITHOUT CONTRAST TECHNIQUE: Multiplanar, multi-echo pulse sequences of the brain and surrounding structures were acquired without intravenous contrast. Angiographic images of the Circle of Willis were acquired using MRA technique without intravenous contrast. COMPARISON:  Head CT 05/22/2023. Head MRI and MRA 11/08/2021. Head and neck CTA 11/09/2021. FINDINGS: MRI HEAD FINDINGS Brain: There is a 3 mm focus of restricted diffusion peripherally in the posterior left cerebellar hemisphere consistent with an acute infarct. There are a few punctate foci of mildly increased signal on axial trace diffusion weighted imaging without corresponding reduced ADC in the right greater than left cerebral hemispheric white matter which may reflect subacute to chronic ischemia. A few chronic microhemorrhages are noted in the basal ganglia. There is a small chronic left cerebellar infarct with associated chronic blood products. T2 hyperintensities in the cerebral white matter bilaterally are similar to the prior MRI and are nonspecific but compatible with mild chronic small vessel ischemic disease. Chronic lacunar infarcts are noted in the right basal ganglia, left thalamus, and deep cerebral white matter. No mass, midline shift, or extra-axial fluid collection is identified. The ventricles and sulci are within normal limits for age. A partially empty sella is unchanged. Vascular: Major intracranial vascular flow voids are preserved. Skull and upper cervical spine: Unremarkable bone marrow signal. Sinuses/Orbits: Unremarkable orbits. No significant inflammatory changes in the paranasal sinuses. No significant mastoid fluid. Other: None. MRA HEAD FINDINGS  Anterior circulation: The internal carotid arteries are patent from skull base to carotid termini with up to mild chronic right paraclinoid stenosis. ACAs and MCAs are patent without evidence of a proximal branch occlusion. There is only mild narrowing of the distal left M1 segment where there was previously a severe stenosis. There is a severe stenosis of the origin of the left M2 superior division. There is a new severe mid to distal right A2 stenosis. No aneurysm is identified. Posterior circulation: The included portions of the intracranial vertebral arteries are patent with the right being dominant and with chronic moderate to severe stenoses of the left V4 segment with suspicion for a left vertebral dissection raised on the 2022 studies. Patent bilateral PICA, right AICA, and bilateral SCA origins are visualized.  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 There is a fetal origin of both PCAs both PCAs are patent with moderate bilateral P2 stenoses. No aneurysm is identified. Anatomic variants: Hypoplastic right A1 segment. IMPRESSION: 1. Punctate acute left cerebellar infarct. 2. 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 3. No large vessel occlusion. 4. Intracranial atherosclerosis including severe left M2 and right A2 stenoses and moderate left V4, basilar, and PCA stenoses. Electronically Signed   By: Sebastian Ache M.D.   On: 05/22/2023 15:48   CT HEAD CODE STROKE WO CONTRAST  Result Date: 05/22/2023 CLINICAL DATA:  Code stroke. Neuro deficit, acute, stroke suspected. Aphasia. L3 mental status. EXAM: CT HEAD WITHOUT CONTRAST TECHNIQUE: Contiguous axial images were obtained from the base of the skull through the vertex without intravenous contrast. RADIATION DOSE REDUCTION: This exam was performed according to the departmental dose-optimization program  which includes automated exposure control, adjustment of the mA and/or kV according to patient size and/or use of iterative reconstruction technique. COMPARISON:  Head CT 04/14/2023 and MRI 11/08/2021 FINDINGS: Brain: There is no evidence of an acute infarct, intracranial hemorrhage, mass, midline shift, or extra-axial fluid collection. A chronic left cerebellar infarct and chronic bilateral basal ganglia lacunar infarcts are again noted. Mild hypodensities in the cerebral white matter bilaterally are unchanged and nonspecific but compatible with chronic small vessel ischemic disease. The ventricles and sulci are normal. Vascular: Calcified atherosclerosis at the skull base. No hyperdense vessel. Skull: No acute fracture or suspicious osseous lesion. Sinuses/Orbits: Minimal mucosal thickening in the paranasal sinuses. Clear mastoid air cells. Unremarkable orbits. Other: None. ASPECTS (Alberta Stroke Program Early CT Score) - Ganglionic level infarction (caudate, lentiform nuclei, internal capsule, insula, M1-M3 cortex): 7 - Supraganglionic infarction (M4-M6 cortex): 3 Total score (0-10 with 10 being normal): 10 These results were communicated to Dr. Wilford Corner at 1:31 pm on 05/22/2023 by text page via the [facility] messaging system. IMPRESSION: 1. No evidence of acute intracranial abnormality. ASPECTS of 10. 2. Chronic ischemia with old basal ganglia and cerebellar infarcts. Electronically Signed   By: Sebastian Ache M.D.   On: 05/22/2023 13:33   NM Hepatobiliary Liver Func  Result Date: 05/21/2023 CLINICAL DATA:  Gallstones. EXAM: NUCLEAR MEDICINE HEPATOBILIARY IMAGING TECHNIQUE: Sequential images of the abdomen were obtained out to 60 minutes following intravenous administration of radiopharmaceutical. RADIOPHARMACEUTICALS:  5.0 mCi Tc-62m  Choletec IV COMPARISON:  CT 05/05/2023 FINDINGS: Prompt uptake and biliary excretion of activity by the liver is seen. Biliary activity passes into small bowel, consistent with patent  common bile duct. No gallbladder activity identified after 120 minutes of sequential imaging. IMPRESSION: 1. Nonvisualization of the gallbladder after 120 minutes of imaging compatible with cholecystitis. 2. Normal biliary to bowel transit consistent with patent common bile duct. Electronically Signed   By: Signa Kell M.D.   On: 05/21/2023 17:23   IR Fluoro Guide CV Line Right  Result Date: 05/17/2023 INDICATION: End-stage renal disease.  Request for a tunneled dialysis catheter. EXAM: FLUOROSCOPIC AND ULTRASOUND GUIDED PLACEMENT OF A TUNNELED DIALYSIS CATHETER Physician: Rachelle Hora. Lowella Dandy, MD MEDICATIONS: Ancef 2 g; The antibiotic was administered within an appropriate time interval prior to skin puncture. ANESTHESIA/SEDATION: Moderate (conscious) sedation was employed during this procedure. A total of Versed 1mg  and fentanyl 75 mcg was administered intravenously at the order of the provider performing the procedure. Total intra-service moderate sedation time: 26 minutes. Patient's level of  consciousness and vital signs were monitored continuously by radiology nurse throughout the procedure under the supervision of the provider performing the procedure. FLUOROSCOPY TIME:  Radiation Exposure Index (as provided by the fluoroscopic device): 2 mGy Kerma COMPLICATIONS: None immediate. PROCEDURE: Informed consent was obtained for placement of a tunneled dialysis catheter. The patient was placed supine on the interventional table. Ultrasound confirmed a patent right internal jugular vein. Ultrasound image obtained for documentation. The right neck and chest was prepped and draped in a sterile fashion. Maximal barrier sterile technique was utilized including caps, mask, sterile gowns, sterile gloves, sterile drape, hand hygiene and skin antiseptic. The existing right jugular catheter was removed with manual compression. The right neck was anesthetized with 1% lidocaine. A small incision was made with #11 blade scalpel.  A 21 gauge needle directed into the right internal jugular vein with ultrasound guidance. A micropuncture dilator set was placed. A 19 cm tip to cuff Palindrome catheter was selected. The skin below the right clavicle was anesthetized and a small incision was made with an #11 blade scalpel. A subcutaneous tunnel was formed to the vein dermatotomy site. The catheter was brought through the tunnel. The vein dermatotomy site was dilated to accommodate a peel-away sheath. The catheter was placed through the peel-away sheath and directed into the central venous structures. The tip of the catheter was placed in the right atrium with fluoroscopy. Fluoroscopic images were obtained for documentation. Both lumens were found to aspirate and flush well. The proper amount of heparin was flushed in both lumens. The vein dermatotomy site was closed using a single layer of absorbable suture and Dermabond. The catheter was secured to the skin using Prolene suture. FINDINGS: Old right jugular catheter was removed. New jugular catheter tip in the right atrium. IMPRESSION: Successful placement of a right jugular tunneled dialysis catheter using ultrasound and fluoroscopic guidance. Electronically Signed   By: Richarda Overlie M.D.   On: 05/17/2023 17:48   IR US Guide Vasc Access Right  Result Date: 05/17/2023 INDICATION: End-stage renal disease.  Request for a tunneled dialysis catheter. EXAM: FLUOROSCOPIC AND ULTRASOUND GUIDED PLACEMENT OF A TUNNELED DIALYSIS CATHETER Physician: Rachelle Hora. Lowella Dandy, MD MEDICATIONS: Ancef 2 g; The antibiotic was administered within an appropriate time interval prior to skin puncture. ANESTHESIA/SEDATION: Moderate (conscious) sedation was employed during this procedure. A total of Versed 1mg  and fentanyl 75 mcg was administered intravenously at the order of the provider performing the procedure. Total intra-service moderate sedation time: 26 minutes. Patient's level of consciousness and vital signs were  monitored continuously by radiology nurse throughout the procedure under the supervision of the provider performing the procedure. FLUOROSCOPY TIME:  Radiation Exposure Index (as provided by the fluoroscopic device): 2 mGy Kerma COMPLICATIONS: None immediate. PROCEDURE: Informed consent was obtained for placement of a tunneled dialysis catheter. The patient was placed supine on the interventional table. Ultrasound confirmed a patent right internal jugular vein. Ultrasound image obtained for documentation. The right neck and chest was prepped and draped in a sterile fashion. Maximal barrier sterile technique was utilized including caps, mask, sterile gowns, sterile gloves, sterile drape, hand hygiene and skin antiseptic. The existing right jugular catheter was removed with manual compression. The right neck was anesthetized with 1% lidocaine. A small incision was made with #11 blade scalpel. A 21 gauge needle directed into the right internal jugular vein with ultrasound guidance. A micropuncture dilator set was placed. A 19 cm tip to cuff Palindrome catheter was selected. The skin below the right  clavicle was anesthetized and a small incision was made with an #11 blade scalpel. A subcutaneous tunnel was formed to the vein dermatotomy site. The catheter was brought through the tunnel. The vein dermatotomy site was dilated to accommodate a peel-away sheath. The catheter was placed through the peel-away sheath and directed into the central venous structures. The tip of the catheter was placed in the right atrium with fluoroscopy. Fluoroscopic images were obtained for documentation. Both lumens were found to aspirate and flush well. The proper amount of heparin was flushed in both lumens. The vein dermatotomy site was closed using a single layer of absorbable suture and Dermabond. The catheter was secured to the skin using Prolene suture. FINDINGS: Old right jugular catheter was removed. New jugular catheter tip in the  right atrium. IMPRESSION: Successful placement of a right jugular tunneled dialysis catheter using ultrasound and fluoroscopic guidance. Electronically Signed   By: Richarda Overlie M.D.   On: 05/17/2023 17:48   NM GASTRIC EMPTYING  Result Date: 05/11/2023 CLINICAL DATA:  Concern for gastroparesis. EXAM: NUCLEAR MEDICINE GASTRIC EMPTYING SCAN TECHNIQUE: After oral ingestion of radiolabeled meal, sequential abdominal images were obtained for 60 minutes. Residual percentage of activity remaining within the stomach was calculated at 60 minutes. Examination was terminated early due to patient emesis. RADIOPHARMACEUTICALS:  2.1 mCi Tc-39m sulfur colloid in standardized meal COMPARISON:  CT May 05, 2023 FINDINGS: Expected location of the stomach in the left upper quadrant. Ingested meal empties the stomach gradually over the course of the study with 60% retention at 60 min. IMPRESSION: Nondiagnostic study secondary to patient emesis. Electronically Signed   By: Maudry Mayhew M.D.   On: 05/11/2023 14:25   CT CHEST WO CONTRAST  Result Date: 05/09/2023 CLINICAL DATA:  Chronic dyspnea.  Chest pain. EXAM: CT CHEST WITHOUT CONTRAST TECHNIQUE: Multidetector CT imaging of the chest was performed following the standard protocol without IV contrast. RADIATION DOSE REDUCTION: This exam was performed according to the departmental dose-optimization program which includes automated exposure control, adjustment of the mA and/or kV according to patient size and/or use of iterative reconstruction technique. COMPARISON:  CT chest abdomen and pelvis 05/05/2023 FINDINGS: Cardiovascular: Heart is enlarged. There is no pericardial effusion. Aorta is normal in size. There are atherosclerotic calcifications of the aorta and coronary arteries. Right-sided central venous catheter tip ends in the SVC. Mediastinum/Nodes: There is a subcentimeter left hypodense thyroid nodule. There are no enlarged mediastinal, hilar or axillary lymph nodes. The  esophagus is within normal limits. Lungs/Pleura: There are small bilateral pleural effusions which have decreased when compared to the prior examination. There are interstitial and patchy ground-glass opacities in both mid and lower lungs involving the lower lobes, right middle lobe and inferior left upper lobe. These have increased when compared to the prior examination. Trachea and central airways are patent. There is no pneumothorax. Upper Abdomen: No acute abnormality. Musculoskeletal: No chest wall mass or suspicious bone lesions identified. IMPRESSION: 1. Decreasing small bilateral pleural effusions. 2. Increasing interstitial and patchy ground-glass opacities in the mid and lower lungs. Findings are nonspecific and may be related to pulmonary edema or infection. 3. Stable cardiomegaly. 4. Subcentimeter left thyroid nodule. Not clinically significant; no follow-up imaging recommended (ref: J Am Coll Radiol. 2015 Feb;12(2): 143-50). Aortic Atherosclerosis (ICD10-I70.0). Electronically Signed   By: Darliss Cheney M.D.   On: 05/09/2023 23:37   DG CHEST PORT 1 VIEW  Result Date: 05/09/2023 CLINICAL DATA:  Shortness of breath EXAM: PORTABLE CHEST 1 VIEW COMPARISON:  Chest x-ray May 08, 2023 FINDINGS: No pneumothorax. Stable right central line. Mild opacity in left base is stable. Opacity in the right base is worsened in the interval. No change in cardiomegaly or cardiomediastinal silhouette. IMPRESSION: 1. Worsening opacity in the right base could represent developing pneumonia/infiltrate. Recommend attention on follow-up 2. Stable mild opacity in the left base. 3. No other changes. Electronically Signed   By: Gerome Sam III M.D.   On: 05/09/2023 10:29   DG Chest Port 1 View  Result Date: 05/08/2023 CLINICAL DATA:  Evaluate central line placement EXAM: PORTABLE CHEST 1 VIEW COMPARISON:  Chest x-ray May 06, 2023 FINDINGS: A new right central line has been placed terminating in the central SVC. No  pneumothorax. Effusion and opacity on the left is less pronounced. Effusion and opacity on the right is less pronounced as well. There is some opacity in the lateral right lung base. No other interval changes or acute abnormalities. IMPRESSION: 1. The new right central line terminates in the central SVC. No pneumothorax. 2. Effusions and opacities in the lung bases are less pronounced. There is some opacity in the lateral right lung base. Electronically Signed   By: Gerome Sam III M.D.   On: 05/08/2023 15:18   DG Chest Port 1 View  Result Date: 05/06/2023 CLINICAL DATA:  Respiratory complication. EXAM: PORTABLE CHEST 1 VIEW COMPARISON:  05/05/2023. FINDINGS: Heart is enlarged and the mediastinal contour stable. There is atherosclerotic calcification of the aorta. Patchy airspace disease is noted in the middle to lower lung fields bilaterally. Small to moderate pleural effusions are present bilaterally. No pneumothorax. IMPRESSION: 1. Stable patchy airspace disease in the mid to lower lung fields bilaterally, possible edema or infiltrate. 2. Small to moderate pleural effusions bilaterally. Electronically Signed   By: Thornell Sartorius M.D.   On: 05/06/2023 22:12   ECHOCARDIOGRAM COMPLETE  Result Date: 05/06/2023    ECHOCARDIOGRAM REPORT   Patient Name:   Samantha [name] Date of Exam: 05/06/2023 Medical Rec #:  [id number]           Height:       65.0 in Accession #:    [id number]          Weight:       156.8 lb Date of Birth:  [birth year]           BSA:          1.784 m Patient Age:    71 years            BP:           142/76 mmHg Patient Gender: F                   HR:           53 bpm. Exam Location:  Inpatient Procedure: 2D Echo, Cardiac Doppler and Color Doppler Indications:    CHF - Acute Diastolic  History:        Patient has prior history of Echocardiogram examinations, most                 recent 11/09/2021. Hx of Aortic root aneurysm, Acute respiratory                 failure, PAD and Stroke; Risk  Factors:Diabetes, Hypertension and                 Dyslipidemia.  Sonographer:    Wallie Char Referring Phys: Gomez Cleverly LAMA  Sonographer Comments: *  Pt had reaction to Definity in the past* IMPRESSIONS  1. Left ventricular ejection fraction, by estimation, is 55 to 60%. The left ventricle has normal function. The left ventricle has no regional wall motion abnormalities. There is mild left ventricular hypertrophy. Left ventricular diastolic parameters are indeterminate.  2. Right ventricular systolic function is normal. The right ventricular size is normal. There is moderately elevated pulmonary artery systolic pressure. The estimated right ventricular systolic pressure is 46.4 mmHg.  3. The mitral valve is grossly normal. Mild mitral valve regurgitation. No evidence of mitral stenosis.  4. The aortic valve is tricuspid. There is mild calcification of the aortic valve. Aortic valve regurgitation is not visualized. No aortic stenosis is present.  5. The inferior vena cava is dilated in size with <50% respiratory variability, suggesting right atrial pressure of 15 mmHg.  6. Ascending aorta measurements are within normal limits for age when indexed to body surface area. FINDINGS  Left Ventricle: Left ventricular ejection fraction, by estimation, is 55 to 60%. The left ventricle has normal function. The left ventricle has no regional wall motion abnormalities. The left ventricular internal cavity size was normal in size. There is  mild left ventricular hypertrophy. Left ventricular diastolic parameters are indeterminate. Right Ventricle: The right ventricular size is normal. No increase in right ventricular wall thickness. Right ventricular systolic function is normal. There is moderately elevated pulmonary artery systolic pressure. The tricuspid regurgitant velocity is 2.80 m/s, and with an assumed right atrial pressure of 15 mmHg, the estimated right ventricular systolic pressure is 46.4 mmHg. Left Atrium: Left  atrial size was normal in size. Right Atrium: Right atrial size was normal in size. Pericardium: There is no evidence of pericardial effusion. Mitral Valve: The mitral valve is grossly normal. Mild mitral valve regurgitation. No evidence of mitral valve stenosis. MV peak gradient, 9.9 mmHg. The mean mitral valve gradient is 5.0 mmHg with average heart rate of 96 bpm. Tricuspid Valve: The tricuspid valve is normal in structure. Tricuspid valve regurgitation is trivial. No evidence of tricuspid stenosis. Aortic Valve: The aortic valve is tricuspid. There is mild calcification of the aortic valve. Aortic valve regurgitation is not visualized. No aortic stenosis is present. Aortic valve mean gradient measures 3.5 mmHg. Aortic valve peak gradient measures 5.3 mmHg. Aortic valve area, by VTI measures 2.13 cm. Pulmonic Valve: The pulmonic valve was normal in structure. Pulmonic valve regurgitation is not visualized. No evidence of pulmonic stenosis. Aorta: The aortic root is normal in size and structure. Ascending aorta measurements are within normal limits for age when indexed to body surface area. Venous: The inferior vena cava is dilated in size with less than 50% respiratory variability, suggesting right atrial pressure of 15 mmHg. IAS/Shunts: The interatrial septum was not well visualized.  LEFT VENTRICLE PLAX 2D LVIDd:         5.10 cm     Diastology LVIDs:         3.70 cm     LV e' medial:    7.27 cm/s LV PW:         1.00 cm     LV E/e' medial:  20.4 LV IVS:        1.20 cm     LV e' lateral:   9.98 cm/s LVOT diam:     1.80 cm     LV E/e' lateral: 14.8 LV SV:         51 LV SV Index:   29 LVOT Area:  2.54 cm  LV Volumes (MOD) LV vol d, MOD A2C: 72.6 ml LV vol d, MOD A4C: 98.3 ml LV vol s, MOD A2C: 33.3 ml LV vol s, MOD A4C: 48.4 ml LV SV MOD A2C:     39.3 ml LV SV MOD A4C:     98.3 ml LV SV MOD BP:      43.6 ml RIGHT VENTRICLE             IVC RV Basal diam:  3.90 cm     IVC diam: 2.10 cm RV S prime:     13.10 cm/s  TAPSE (M-mode): 2.0 cm LEFT ATRIUM             Index        RIGHT ATRIUM           Index LA diam:        4.00 cm 2.24 cm/m   RA Area:     15.60 cm LA Vol (A2C):   49.5 ml 27.75 ml/m  RA Volume:   38.20 ml  21.42 ml/m LA Vol (A4C):   45.8 ml 25.68 ml/m LA Biplane Vol: 48.2 ml 27.02 ml/m  AORTIC VALVE AV Area (Vmax):    2.17 cm AV Area (Vmean):   2.11 cm AV Area (VTI):     2.13 cm AV Vmax:           115.00 cm/s AV Vmean:          87.200 cm/s AV VTI:            0.240 m AV Peak Grad:      5.3 mmHg AV Mean Grad:      3.5 mmHg LVOT Vmax:         98.05 cm/s LVOT Vmean:        72.300 cm/s LVOT VTI:          0.201 m LVOT/AV VTI ratio: 0.84  AORTA Ao Root diam: 3.50 cm Ao Asc diam:  3.80 cm MITRAL VALVE                  TRICUSPID VALVE MV Area (PHT): 5.31 cm       TR Peak grad:   31.4 mmHg MV Area VTI:   1.62 cm       TR Vmax:        280.00 cm/s MV Peak grad:  9.9 mmHg MV Mean grad:  5.0 mmHg       SHUNTS MV Vmax:       1.57 m/s       Systemic VTI:  0.20 m MV Vmean:      105.0 cm/s     Systemic Diam: 1.80 cm MV Decel Time: 143 msec MR Peak grad:    127.2 mmHg MR Mean grad:    83.0 mmHg MR Vmax:         564.00 cm/s MR Vmean:        424.0 cm/s MR PISA:         4.02 cm MR PISA Eff ROA: 22 mm MR PISA Radius:  0.80 cm MV E velocity: 148.00 cm/s MV A velocity: 121.00 cm/s MV E/A ratio:  1.22 Weston Brass MD Electronically signed by Weston Brass MD Signature Date/Time: 05/06/2023/2:39:59 PM    Final    US RENAL  Result Date: 05/05/2023 CLINICAL DATA:  Chronic kidney disease. EXAM: RENAL / URINARY TRACT ULTRASOUND COMPLETE COMPARISON:  CT of the chest, abdomen, pelvis on 05/05/2023 FINDINGS: Right Kidney: Renal measurements:  10.5 x 4.2 x 5.5 centimeters = volume: 125.1 mL. There is narrowing of the renal cortex. Normal renal echogenicity. No hydronephrosis or suspicious renal mass. Left Kidney: Renal measurements: 9.2 x 4.3 x 3.6 centimeters = volume: 13.8 mL. There is renal parenchymal thinning. Normal renal  echogenicity. No hydronephrosis or suspicious renal mass. Bladder: Appears normal for degree of bladder distention. Other: None. IMPRESSION: No hydronephrosis. Renal parenchymal thinning bilaterally. Electronically Signed   By: Norva Pavlov M.D.   On: 05/05/2023 18:49   CT CHEST ABDOMEN PELVIS WO CONTRAST  Result Date: 05/05/2023 CLINICAL DATA:  Sepsis.  Increasing O2 requirements. EXAM: CT CHEST, ABDOMEN AND PELVIS WITHOUT CONTRAST TECHNIQUE: Multidetector CT imaging of the chest, abdomen and pelvis was performed following the standard protocol without IV contrast. RADIATION DOSE REDUCTION: This exam was performed according to the departmental dose-optimization program which includes automated exposure control, adjustment of the mA and/or kV according to patient size and/or use of iterative reconstruction technique. COMPARISON:  Chest x-ray earlier 05/05/2023. Ultrasound abdomen 05/05/2023. CT scan 04/14/2023. FINDINGS: CT CHEST FINDINGS Cardiovascular: Heart is nonenlarged. Trace pericardial fluid or thickening. Coronary artery calcifications are seen. The thoracic aorta has a normal course and caliber with scattered vascular calcifications. Bovine type aortic arch. Significant calcified plaque of the left subclavian artery. Mediastinum/Nodes: Small hiatal hernia. Normal caliber thoracic esophagus. Heterogeneous small thyroid gland. No specific abnormal lymph node enlargement seen in the axillary region or hila on this noncontrast exam. There are some prominent less than 1 cm in size in short axis mediastinal nodes, nonpathologic by size criteria. Example right of midline, precarinal measures 8 mm in short axis on series 2, image 23. These nodes are slightly more numerous than usually seen however. Lungs/Pleura: Developing small to moderate bilateral pleural effusions, right-greater-than-left. Adjacent parenchymal opacities in the lower lobes. Consolidation is possible. There are some patchy ground-glass  and interstitial changes seen along the upper lung zones with thickening of the interstitial septa. Please correlate for a component of edema. No pneumothorax. Musculoskeletal: Mild degenerative changes along the spine. CT ABDOMEN PELVIS FINDINGS Hepatobiliary: Dilated gallbladder with some dependent stones. This was seen previously. Please correlate with separate ultrasound from 05/05/2023 earlier preserved hepatic parenchyma on this noncontrast examination otherwise. Pancreas: Moderate atrophy of the pancreas.  No obvious mass. Spleen: Splenic granulomas. Adrenals/Urinary Tract: The adrenal glands are preserved. Mild bilateral renal atrophy with some perinephric stranding. Multiple calcifications along each kidney could be vascular rather than collecting system. No collecting system dilatation. No abnormal calcification seen within either ureter. Bladder is underdistended. There are low-lying pelvic structures. Please correlate for prolapse. Stomach/Bowel: On this non oral contrast exam, the large bowel has a normal course and caliber with scattered colonic stool. The stomach and small bowel are nondilated. Normal appendix in the right lower quadrant extending inferior to the cecum. Vascular/Lymphatic: Diffuse vascular calcifications identified particularly of branch vessels. Normal caliber aorta and IVC. No specific abnormal lymph node enlargement identified in the abdomen and pelvis. Reproductive: Status post hysterectomy. No adnexal masses. Other: Small focal wide mouth hernia seen along the anterior pelvic wall in the right side lateral to the margin of the right rectus abdominis muscle containing a loop of small bowel. No obstruction. This is unchanged from previous. Small fat containing umbilical hernia. Musculoskeletal: Scattered degenerative changes seen of the spine and pelvis. IMPRESSION: New bilateral pleural effusions, right-greater-than-left with the adjacent parenchymal opacities. Infiltrates are  possible and recommend follow-up. There is also a component of interstitial septal thickening which  is new. A component of edema is possible. No bowel obstruction, free air or free fluid with scattered stool. No obstructing renal stones.  Mild bilateral renal atrophy. Dilated gallbladder with a dependent stone. No adjacent inflammatory changes by noncontrast CT. Please correlate with the prior ultrasound. If there is further concern, HIDA scan may be of some benefit. Significant diffuse vascular calcifications. 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 Please correlate with any particular symptoms. Electronically Signed   By: Karen Kays M.D.   On: 05/05/2023 14:58   US Abdomen Limited RUQ (LIVER/GB)  Result Date: 05/05/2023 CLINICAL DATA:  Right upper quadrant pain for several weeks. EXAM: ULTRASOUND ABDOMEN LIMITED RIGHT UPPER QUADRANT COMPARISON:  04/14/2023 FINDINGS: Gallbladder: Gallbladder appears distended, similar to CT from 04/14/2023. Multiple stones are identified measuring up to 8 mm. No gallbladder wall thickening, pericholecystic fluid, or sludge. Negative sonographic Murphy's sign (note: patient received pain medication prior to ultrasound). Common bile duct: Diameter: 5.6 mm Liver: No focal lesion identified. Mild increased parenchymal echogenicity. Portal vein is patent on color Doppler imaging with normal direction of blood flow towards the liver. Other: None. IMPRESSION: 1. Gallstones. No gallbladder wall thickening, pericholecystic fluid, or sludge. Negative sonographic Murphy's sign (note: patient received pain medication prior to ultrasound). 2. Mild increased hepatic parenchymal echogenicity suggestive of steatosis. Electronically Signed   By: Signa Kell M.D.   On: 05/05/2023 11:56   DG Chest Port 1 View  Result Date: 05/05/2023 CLINICAL DATA:  Generalized chest pain, shortness of breath, and cough EXAM: PORTABLE CHEST 1 VIEW COMPARISON:   Chest radiograph dated 04/17/2023 FINDINGS: Low lung volumes with bronchovascular crowding. Bilateral perihilar interstitial opacities and bibasilar patchy opacities. Increased moderate bilateral pleural effusions. No pneumothorax. The heart size and mediastinal contours are within normal limits. No acute osseous abnormality. IMPRESSION: 1. Pulmonary edema with increased moderate bilateral pleural effusions. 2. Bibasilar patchy opacities, likely atelectasis. Aspiration or pneumonia can be considered in the appropriate clinical setting. Electronically Signed   By: Agustin Cree M.D.   On: 05/05/2023 11:38    Labs:  CBC: Recent Labs    05/19/23 1132 05/21/23 0405 05/22/23 0308 05/23/23 0354  WBC 12.8* 20.5* 20.7* 23.3*  HGB 10.9* 10.5* 10.7* 11.0*  HCT 32.0* 30.0* 31.1* 32.2*  PLT 291 272 322 340    COAGS: Recent Labs    05/17/23 0433  INR 1.1    BMP: Recent Labs    05/20/23 0422 05/21/23 0405 05/22/23 0309 05/23/23 0354  NA 130* 131* 131* 133*  K 4.0 3.9 4.1 3.8  CL 92* 93* 92* 91*  CO2 23 24 21* 21*  GLUCOSE 209* 156* 182* 237*  BUN 46* 31* 60* 46*  CALCIUM 9.4 9.1 9.3 9.1  CREATININE 4.90* 4.14* 5.87* 5.21*  GFRNONAA 9* 11* 7* 8*    LIVER FUNCTION TESTS: Recent Labs    05/07/23 0532 05/08/23 0636 05/21/23 0405 05/22/23 0308 05/22/23 0309 05/23/23 0354  BILITOT 0.7  --  1.1 1.0  --  1.0  AST 27  --  39 33  --  68*  ALT 22  --  18 16  --  24  ALKPHOS 63  --  91 100  --  153*  PROT 6.4*  --  7.1 7.5  --  7.7  ALBUMIN 3.1*   < > 3.0* 2.7* 2.8* 2.8*   < > = values in this interval not displayed.    TUMOR MARKERS: No results for input(s): "AFPTM", "  CEA", "CA199", "CHROMGRNA" in the last 8760 hours.  Assessment and Plan: Acute cholecystitis Patient admitted with multiple medical conditions including respiratory failure, acute-on-chronic kidney disease, now with abdominal pain, fever, and leukocytosis found to have acute cholecystitis with gall stones.  WBC 23  this AM.  Developed confusion yesterday likely attributed to metabolic encephalopathy.  PA met with daughter and patient at bedside to discuss percutaneous drainage as recommended by surgery team.  They are aware of the risks and benefits.  Also aware drain will likely remain at discharge for a minimum of 6-8 weeks.  NPO.  Proceed in IR today.   Risks and benefits discussed with the patient including, but not limited to bleeding, infection, gallbladder perforation, bile leak, sepsis or even death.  All of the patient's questions were answered, patient is agreeable to proceed. Consent signed and in chart.  Thank you for this interesting consult.  I greatly enjoyed meeting Nataliah Feria and look forward to participating in their care.  A copy of this report was sent to the requesting provider on this date.  Electronically Signed: Hoyt Koch, PA 05/23/2023, 9:18 AM   I spent a total of 40 Minutes    in face to face in clinical consultation, greater than 50% of which was counseling/coordinating care for acute cholecystitis.

## 2023-05-24 LAB — RENAL FUNCTION PANEL
Albumin: 2.5 g/dL — ABNORMAL LOW (ref 3.5–5.0)
CO2: 20 mmol/L — ABNORMAL LOW (ref 22–32)
Creatinine, Ser: 7.32 mg/dL — ABNORMAL HIGH (ref 0.44–1.00)
Phosphorus: 8.4 mg/dL — ABNORMAL HIGH (ref 2.5–4.6)
Potassium: 4.2 mmol/L (ref 3.5–5.1)

## 2023-05-24 LAB — AEROBIC/ANAEROBIC CULTURE W GRAM STAIN (SURGICAL/DEEP WOUND)

## 2023-05-24 LAB — CULTURE, BLOOD (ROUTINE X 2): Special Requests: ADEQUATE

## 2023-05-24 LAB — GLUCOSE, CAPILLARY
Glucose-Capillary: 188 mg/dL — ABNORMAL HIGH (ref 70–99)
Glucose-Capillary: 127 mg/dL — ABNORMAL HIGH (ref 70–99)
Glucose-Capillary: 91 mg/dL (ref 70–99)
Glucose-Capillary: 142 mg/dL — ABNORMAL HIGH (ref 70–99)

## 2023-05-24 MED ORDER — SEVELAMER CARBONATE 800 MG PO TABS
1600.0000 mg | ORAL_TABLET | Freq: Three times a day (TID) | ORAL | Status: DC
  Administered 2023-05-24 – 2023-05-28 (×8): 1600 mg via ORAL
  Filled 2023-05-24 (×8): qty 2

## 2023-05-24 MED ORDER — CHLORHEXIDINE GLUCONATE CLOTH 2 % EX PADS
6.0000 | MEDICATED_PAD | Freq: Every day | CUTANEOUS | Status: DC
  Administered 2023-05-25 – 2023-05-28 (×4): 6 via TOPICAL

## 2023-05-24 NOTE — Progress Notes (Signed)
PROGRESS NOTE        PATIENT DETAILS Name: Samantha [name] Age: 72 y.o. Sex: female Date of Birth: 31-Jul-1951 Admit Date: 05/05/2023 Admitting Physician Meredeth Ide, MD WUJ:[id number], Odette Horns, MD  Brief Summary: Patient is a 71 y.o.  female with history of HTN, DM-2, PAD, CVA, depression/anxiety-who presented with acute hypoxic respiratory failure in the setting of pulm edema with worsening renal function.  Unfortunately-thought to have progressed to ESRD-requiring HD, for the hospital course complicated by development of acute metabolic encephalopathy, and acute calculus cholecystitis.    Significant events: 5/08>> admit to [facility]  Significant studies: 5/08>> CT chest/abdomen/pelvis: New bilateral pleural effusions, right> left parenchymal opacities 5/08>> renal ultrasound: No hydronephrosis 5/09>>Echo: EF 55-60% 5/12>> CT chest: Increasing interstitial/patchy groundglass opacities-decreasing small bilateral pleural effusions. 5/14>> gastric emptying study: Nondiagnostic due to emesis 5/24>> HIDA scan: Non- visualization of cystic duct 5/25>> CT head: No acute intracranial abnormality. 5/25>> MRA brain: Intracranial atherosclerosis including severe left M2/right A2 stenosis, moderate left V4, basilar and PCA stenosis. 5/25>> MRI brain: Punctate acute left cerebellar infarct. 5/26>> LTM EEG: No seizures.  Significant microbiology data: 5/08>> COVID PCR: Negative 5/08>> blood culture: Negative 5/11>> pleural fluid culture: No growth 5/22>> blood culture: No growth  Procedures: 5/11>> thoracocentesis (pleural fluid cytology negative) 5/11>> nontunneled dialysis catheter by PCCM 5/20>> tunneled dialysis catheter by IR 5/26>> cholecystostomy tube by IR.  Consults: PCCM Nephrology Interventional radiology General surgery Neurology  Subjective: Completely awake/alert-some mild RUQ pain.  Tolerating diet.  Objective: Vitals: Blood pressure 127/63,  pulse 68, temperature 98.8 F (37.1 C), temperature source Oral, resp. rate 20, height 5\' 5"  (1.651 m), weight 59.1 kg, SpO2 95 %.   Exam: Gen Exam:Alert awake-not in any distress HEENT:atraumatic, normocephalic Chest: B/L clear to auscultation anteriorly CVS:S1S2 regular Abdomen: Mild tenderness in the RUQ area. Extremities:no edema Neurology: Non focal Skin: no rash  Pertinent Labs/Radiology:    Latest Ref Rng & Units 05/23/2023    1:35 PM 05/23/2023    3:54 AM 05/22/2023    3:08 AM  CBC  WBC 4.0 - 10.5 K/uL 25.2  23.3  20.7   Hemoglobin 12.0 - 15.0 g/dL 78.2  95.6  21.3   Hematocrit 36.0 - 46.0 % 31.8  32.2  31.1   Platelets 150 - 400 K/uL 351  340  322     Lab Results  Component Value Date   NA 134 (L) 05/24/2023   K 4.2 05/24/2023   CL 91 (L) 05/24/2023   CO2 20 (L) 05/24/2023      Assessment/Plan: AKI on CKD stage IIIb Likely hemodynamically mediated-suspicion that she may have progressed to ESRD Nephrology following and directing HD scheduled/care. Continue to monitor for signs of renal recovery  Acute hypoxic respiratory failure Secondary to acute pulm edema in the setting of worsening renal function/pleural effusion.  (PNA ruled out) Volume being managed with HD-dose IV Lasix Currently improved-and on room air  Acute calculus cholecystitis  Had low-grade fever on 5/22 and subsequently worsening leukocytosis/RUQ pain on 5/24-HIDA scan positive-evaluated by general surgery-Plavix held-underwent cholecystostomy tube placement by IR on 5/26 Continue Zosyn.   Follow CBC  Acute metabolic encephalopathy Had significant worsening of mental status at the end of HD on 5/25-neuroimaging negative for CVA, LTM EEG negative for seizures.  Suspicion is for encephalopathy in the setting of cholecystitis.  Thankfully after placement of cholecystostomy  tube-she is significantly improved-and completely awake and alert. MRI brain showed a punctate acute infarct-not clear if this  is artifactual or a insignificant/incidental finding.  No further recommendations by neurology at this point.  Hyponatremia Due to impaired water excretion in the setting of worsening renal function Improving with ongoing volume removal with hemodialysis.  Hypokalemia Repleted  Acute on chronic HFrEF Acute pulm edema Volume status stable with HD/IV Lasix  History of CAD No anginal symptoms Echo with stable EF Lasix held for cholecystostomy-resume 5/27 Continue beta-blocker/statin.  HTN BP stable on Coreg Follow/optimize  HLD Statin  DM-2 (A1c 7.7 on 4/18) CBGs stable on 15 units of Semglee daily, 3 units of NovoLog with meals and SSI  Recent Labs    05/23/23 1625 05/23/23 2125 05/24/23 0751  GLUCAP 199* 170* 188*     Subcentimeter left thyroid nodule Per radiology-not clinically significant-no follow-up recommendations.  BMI: Estimated body mass index is 21.68 kg/m as calculated from the following:   Height as of this encounter: 5\' 5"  (1.651 m).   Weight as of this encounter: 59.1 kg.   Code status:   Code Status: Full Code   DVT Prophylaxis: heparin injection 5,000 Units Start: 05/23/23 2200   Family Communication: Daughter at bedside.  Disposition Plan: Status is: Inpatient Remains inpatient appropriate because: Severity of illness   Planned Discharge Destination:Home   Diet: Diet Order             Diet renal/carb modified with fluid restriction Diet-HS Snack? Nothing; Fluid restriction: 1200 mL Fluid; Room service appropriate? Yes; Fluid consistency: Thin  Diet effective now                     Antimicrobial agents: Anti-infectives (From admission, onward)    Start     Dose/Rate Route Frequency Ordered Stop   05/21/23 1830  piperacillin-tazobactam (ZOSYN) IVPB 2.25 g        2.25 g 100 mL/hr over 30 Minutes Intravenous Every 8 hours 05/21/23 1740     05/17/23 1500  ceFAZolin (ANCEF) IVPB 2g/100 mL premix        2 g 200 mL/hr over  30 Minutes Intravenous To Radiology 05/16/23 1204 05/17/23 1537   05/08/23 1000  ceFAZolin (ANCEF) IVPB 2g/100 mL premix        2 g 200 mL/hr over 30 Minutes Intravenous To Radiology 05/07/23 1537 05/09/23 1000   05/05/23 1515  cefTRIAXone (ROCEPHIN) 1 g in sodium chloride 0.9 % 100 mL IVPB        1 g 200 mL/hr over 30 Minutes Intravenous  Once 05/05/23 1510 05/05/23 1642   05/05/23 1515  azithromycin (ZITHROMAX) 500 mg in sodium chloride 0.9 % 250 mL IVPB        500 mg 250 mL/hr over 60 Minutes Intravenous  Once 05/05/23 1510 05/05/23 1750        MEDICATIONS: Scheduled Meds:  atorvastatin  80 mg Oral Daily   bisacodyl  10 mg Rectal Daily   carvedilol  6.25 mg Oral BID WC   Chlorhexidine Gluconate Cloth  6 each Topical Q0600   clopidogrel  75 mg Oral Daily   diphenhydrAMINE  25 mg Intravenous Once   docusate sodium  100 mg Oral BID   fluticasone  2 spray Each Nare Daily   guaiFENesin  600 mg Oral BID   heparin injection (subcutaneous)  5,000 Units Subcutaneous Q8H   insulin aspart  0-15 Units Subcutaneous TID WC   insulin aspart  0-5 Units  Subcutaneous QHS   insulin aspart  3 Units Subcutaneous TID WC   insulin glargine-yfgn  15 Units Subcutaneous Daily   oxymetazoline  1 spray Each Nare Once   sodium chloride flush  3 mL Intravenous Q12H   Continuous Infusions:  sodium chloride Stopped (05/09/23 2241)   piperacillin-tazobactam (ZOSYN)  IV 2.25 g (05/24/23 0714)   PRN Meds:.sodium chloride, acetaminophen, albuterol, heparin, HYDROmorphone (DILAUDID) injection, iohexol, LORazepam, melatonin, ondansetron **OR** ondansetron (ZOFRAN) IV, mouth rinse, oxyCODONE, sodium chloride, sodium chloride flush   I have personally reviewed following labs and imaging studies  LABORATORY DATA: CBC: Recent Labs  Lab 05/19/23 1132 05/21/23 0405 05/22/23 0308 05/23/23 0354 05/23/23 1335  WBC 12.8* 20.5* 20.7* 23.3* 25.2*  NEUTROABS  --   --   --   --  22.3*  HGB 10.9* 10.5* 10.7*  11.0* 10.8*  HCT 32.0* 30.0* 31.1* 32.2* 31.8*  MCV 90.9 91.2 91.5 94.7 91.6  PLT 291 272 322 340 351     Basic Metabolic Panel: Recent Labs  Lab 05/20/23 0422 05/21/23 0405 05/22/23 0309 05/23/23 0354 05/24/23 0335  NA 130* 131* 131* 133* 134*  K 4.0 3.9 4.1 3.8 4.2  CL 92* 93* 92* 91* 91*  CO2 23 24 21* 21* 20*  GLUCOSE 209* 156* 182* 237* 243*  BUN 46* 31* 60* 46* 79*  CREATININE 4.90* 4.14* 5.87* 5.21* 7.32*  CALCIUM 9.4 9.1 9.3 9.1 9.2  PHOS 5.4* 5.3* 7.9* 7.0* 8.4*     GFR: Estimated Creatinine Clearance: 6.5 mL/min (A) (by C-G formula based on SCr of 7.32 mg/dL (H)).  Liver Function Tests: Recent Labs  Lab 05/21/23 0405 05/22/23 0308 05/22/23 0309 05/23/23 0354 05/24/23 0335  AST 39 33  --  68*  --   ALT 18 16  --  24  --   ALKPHOS 91 100  --  153*  --   BILITOT 1.1 1.0  --  1.0  --   PROT 7.1 7.5  --  7.7  --   ALBUMIN 3.0* 2.7* 2.8* 2.8* 2.5*    No results for input(s): "LIPASE", "AMYLASE" in the last 168 hours. Recent Labs  Lab 05/22/23 1621  AMMONIA 15     Coagulation Profile: No results for input(s): "INR", "PROTIME" in the last 168 hours.   Cardiac Enzymes: No results for input(s): "CKTOTAL", "CKMB", "CKMBINDEX", "TROPONINI" in the last 168 hours.  BNP (last 3 results) No results for input(s): "PROBNP" in the last 8760 hours.  Lipid Profile: No results for input(s): "CHOL", "HDL", "LDLCALC", "TRIG", "CHOLHDL", "LDLDIRECT" in the last 72 hours.  Thyroid Function Tests: No results for input(s): "TSH", "T4TOTAL", "FREET4", "T3FREE", "THYROIDAB" in the last 72 hours.  Anemia Panel: No results for input(s): "VITAMINB12", "FOLATE", "FERRITIN", "TIBC", "IRON", "RETICCTPCT" in the last 72 hours.  Urine analysis:    Component Value Date/Time   COLORURINE YELLOW 05/05/2023 0927   APPEARANCEUR CLEAR 05/05/2023 0927   LABSPEC 1.010 05/05/2023 0927   PHURINE 6.0 05/05/2023 0927   GLUCOSEU 50 (A) 05/05/2023 0927   HGBUR NEGATIVE  05/05/2023 0927   BILIRUBINUR NEGATIVE 05/05/2023 0927   BILIRUBINUR negative 08/18/2021 1207   BILIRUBINUR neg 06/28/2017 1508   KETONESUR NEGATIVE 05/05/2023 0927   PROTEINUR 100 (A) 05/05/2023 0927   UROBILINOGEN 0.2 08/18/2021 1207   UROBILINOGEN 0.2 01/15/2015 1032   NITRITE NEGATIVE 05/05/2023 0927   LEUKOCYTESUR NEGATIVE 05/05/2023 0927    Sepsis Labs: Lactic Acid, Venous    Component Value Date/Time   LATICACIDVEN 2.22 (H) 01/10/2015  1339    MICROBIOLOGY: Recent Results (from the past 240 hour(s))  Culture, blood (Routine X 2) w Reflex to ID Panel     Status: None   Collection Time: 05/19/23  6:06 PM   Specimen: BLOOD LEFT ARM  Result Value Ref Range Status   Specimen Description BLOOD LEFT ARM  Final   Special Requests   Final    BOTTLES DRAWN AEROBIC AND ANAEROBIC Blood Culture adequate volume   Culture   Final    NO GROWTH 5 DAYS Performed at [facility], [address] [place], [place] [address]    Report Status 05/24/2023 FINAL  Final  Culture, blood (Routine X 2) w Reflex to ID Panel     Status: None   Collection Time: 05/19/23  6:06 PM   Specimen: BLOOD LEFT ARM  Result Value Ref Range Status   Specimen Description BLOOD LEFT ARM  Final   Special Requests   Final    BOTTLES DRAWN AEROBIC AND ANAEROBIC Blood Culture adequate volume   Culture   Final    NO GROWTH 5 DAYS Performed at [facility], [address], [place], [place] [address]    Report Status 05/24/2023 FINAL  Final  Aerobic/Anaerobic Culture w Gram Stain (surgical/deep wound)     Status: None (Preliminary result)   Collection Time: 05/23/23 11:27 AM   Specimen: BILE  Result Value Ref Range Status   Specimen Description BILE  Final   Special Requests NONE  Final   Gram Stain NO WBC SEEN NO ORGANISMS SEEN   Final   Culture   Final    NO GROWTH < 24 HOURS Performed at [facility], [address], [place], [place] [address]    Report Status PENDING  Incomplete     RADIOLOGY STUDIES/RESULTS: EEG adult  Result Date: 05/23/2023 Charlsie Quest, MD     05/23/2023  6:27 AM Patient Name: Sailee Crumb MRN: [id number] Epilepsy Attending: Charlsie Quest Referring Physician/Provider: Maretta Bees, MD Date: 05/22/2023 Duration: 27.33 mins Patient history: 72yo F with ams getting eeg to evaluate for seizure. Level of alertness: lethargic AEDs during EEG study: None Technical aspects: This EEG study was done with scalp electrodes positioned according to the 10-20 International system of electrode placement. Electrical activity was reviewed with band pass filter of 1-70Hz , sensitivity of 7 uV/mm, display speed of 29mm/sec with a 60Hz  notched filter applied as appropriate. EEG data were recorded continuously and digitally stored.  Video monitoring was available and reviewed as appropriate. Description: No clear posterior dominant rhythm was seen. EEG showed continuous generalized 3 to 7 Hz theta-delta slowing. Intermittent generalized periodic discharges with triphasic morphology at 2 Hz were also noted. Physiologic photic driving was not seen during photic stimulation.  Hyperventilation was not performed.   ABNORMALITY - Periodic discharges with triphasic morphology, generalized ( GPDs) - Continuous slow, generalized IMPRESSION: This study showed generalized periodic discharges with triphasic morphology which is most likely secondary to toxic-metabolic causes. However, the frequency of 2hz  can rarely evolve into ictal-interictal continuum. Recommend long term monitoring for better evaluation. Additionally there is moderate diffuse encephalopathy. No seizures were seen throughout the recording. Charlsie Quest   MR BRAIN WO CONTRAST  Result Date: 05/22/2023 CLINICAL DATA:  Neuro deficit, acute, stroke suspected.  Aphasia. EXAM: MRI HEAD WITHOUT CONTRAST MRA HEAD WITHOUT CONTRAST TECHNIQUE: Multiplanar, multi-echo pulse sequences of the brain and surrounding  structures were acquired without intravenous contrast. Angiographic images of the  Circle of Willis were acquired using MRA technique without intravenous contrast. COMPARISON:  Head CT 05/22/2023. Head MRI and MRA 11/08/2021. Head and neck CTA 11/09/2021. FINDINGS: MRI HEAD FINDINGS Brain: There is a 3 mm focus of restricted diffusion peripherally in the posterior left cerebellar hemisphere consistent with an acute infarct. There are a few punctate foci of mildly increased signal on axial trace diffusion weighted imaging without corresponding reduced ADC in the right greater than left cerebral hemispheric white matter which may reflect subacute to chronic ischemia. A few chronic microhemorrhages are noted in the basal ganglia. There is a small chronic left cerebellar infarct with associated chronic blood products. T2 hyperintensities in the cerebral white matter bilaterally are similar to the prior MRI and are nonspecific but compatible with mild chronic small vessel ischemic disease. Chronic lacunar infarcts are noted in the right basal ganglia, left thalamus, and deep cerebral white matter. No mass, midline shift, or extra-axial fluid collection is identified. The ventricles and sulci are within normal limits for age. A partially empty sella is unchanged. Vascular: Major intracranial vascular flow voids are preserved. Skull and upper cervical spine: Unremarkable bone marrow signal. Sinuses/Orbits: Unremarkable orbits. No significant inflammatory changes in the paranasal sinuses. No significant mastoid fluid. Other: None. MRA HEAD FINDINGS Anterior circulation: The internal carotid arteries are patent from skull base to carotid termini with up to mild chronic right paraclinoid stenosis. ACAs and MCAs are patent without evidence of a proximal branch occlusion. There is only mild narrowing of the distal left M1 segment where there was previously a severe stenosis. There is a severe stenosis of the origin of the left  M2 superior division. There is a new severe mid to distal right A2 stenosis. No aneurysm is identified. Posterior circulation: The included portions of the intracranial vertebral arteries are patent with the right being dominant and with chronic moderate to severe stenoses of the left V4 segment with suspicion for a left vertebral dissection raised on the 2022 studies. Patent bilateral PICA, right AICA, and bilateral SCA origins are visualized. 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 There is a fetal origin of both PCAs both PCAs are patent with moderate bilateral P2 stenoses. No aneurysm is identified. Anatomic variants: Hypoplastic right A1 segment. IMPRESSION: 1. Punctate acute left cerebellar infarct. 2. 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 3. No large vessel occlusion. 4. Intracranial atherosclerosis including severe left M2 and right A2 stenoses and moderate left V4, basilar, and PCA stenoses. Electronically Signed   By: Sebastian Ache M.D.   On: 05/22/2023 15:48   MR ANGIO HEAD WO CONTRAST  Result Date: 05/22/2023 CLINICAL DATA:  Neuro deficit, acute, stroke suspected.  Aphasia. EXAM: MRI HEAD WITHOUT CONTRAST MRA HEAD WITHOUT CONTRAST TECHNIQUE: Multiplanar, multi-echo pulse sequences of the brain and surrounding structures were acquired without intravenous contrast. Angiographic images of the Circle of Willis were acquired using MRA technique without intravenous contrast. COMPARISON:  Head CT 05/22/2023. Head MRI and MRA 11/08/2021. Head and neck CTA 11/09/2021. FINDINGS: MRI HEAD FINDINGS Brain: There is a 3 mm focus of restricted diffusion peripherally in the posterior left cerebellar hemisphere consistent with an acute infarct. There are a few punctate foci of mildly increased signal on axial trace diffusion weighted imaging without  corresponding reduced ADC in the right greater than left cerebral hemispheric white matter which may reflect subacute to chronic ischemia. A few chronic  microhemorrhages are noted in the basal ganglia. There is a small chronic left cerebellar infarct with associated chronic blood products. T2 hyperintensities in the cerebral white matter bilaterally are similar to the prior MRI and are nonspecific but compatible with mild chronic small vessel ischemic disease. Chronic lacunar infarcts are noted in the right basal ganglia, left thalamus, and deep cerebral white matter. No mass, midline shift, or extra-axial fluid collection is identified. The ventricles and sulci are within normal limits for age. A partially empty sella is unchanged. Vascular: Major intracranial vascular flow voids are preserved. Skull and upper cervical spine: Unremarkable bone marrow signal. Sinuses/Orbits: Unremarkable orbits. No significant inflammatory changes in the paranasal sinuses. No significant mastoid fluid. Other: None. MRA HEAD FINDINGS Anterior circulation: The internal carotid arteries are patent from skull base to carotid termini with up to mild chronic right paraclinoid stenosis. ACAs and MCAs are patent without evidence of a proximal branch occlusion. There is only mild narrowing of the distal left M1 segment where there was previously a severe stenosis. There is a severe stenosis of the origin of the left M2 superior division. There is a new severe mid to distal right A2 stenosis. No aneurysm is identified. Posterior circulation: The included portions of the intracranial vertebral arteries are patent with the right being dominant and with chronic moderate to severe stenoses of the left V4 segment with suspicion for a left vertebral dissection raised on the 2022 studies. Patent bilateral PICA, right AICA, and bilateral SCA origins are visualized. 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 There is a fetal origin of both PCAs both PCAs are patent with moderate bilateral P2 stenoses. No aneurysm is identified. Anatomic variants: Hypoplastic right A1 segment. IMPRESSION: 1. Punctate acute left cerebellar infarct. 2. 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 3. No large vessel occlusion. 4. Intracranial atherosclerosis including severe left M2 and right A2 stenoses and moderate left V4, basilar, and PCA stenoses. Electronically Signed   By: Sebastian Ache M.D.   On: 05/22/2023 15:48   CT HEAD CODE STROKE WO CONTRAST  Result Date: 05/22/2023 CLINICAL DATA:  Code stroke. Neuro deficit, acute, stroke suspected. Aphasia. L3 mental status. EXAM: CT HEAD WITHOUT CONTRAST TECHNIQUE: Contiguous axial images were obtained from the base of the skull through the vertex without intravenous contrast. RADIATION DOSE REDUCTION: This exam was performed according to the departmental dose-optimization program which includes automated exposure control, adjustment of the mA and/or kV according to patient size and/or use of iterative reconstruction technique. COMPARISON:  Head CT 04/14/2023 and MRI 11/08/2021 FINDINGS: Brain: There is no evidence of an acute infarct, intracranial hemorrhage, mass, midline shift, or extra-axial fluid collection. A chronic left cerebellar infarct and chronic bilateral basal ganglia lacunar infarcts are again noted. Mild hypodensities in the cerebral white matter bilaterally are unchanged and nonspecific but compatible with chronic small vessel ischemic disease. The ventricles and sulci are normal. Vascular: Calcified atherosclerosis at the skull base. No hyperdense vessel. Skull: No acute fracture or suspicious osseous lesion. Sinuses/Orbits: Minimal mucosal thickening in the paranasal sinuses. Clear mastoid air cells. Unremarkable orbits. Other: None. ASPECTS [facility] Stroke  Program Early CT Score) - Ganglionic level infarction (caudate, lentiform nuclei, internal capsule, insula, M1-M3 cortex): 7 - Supraganglionic infarction (M4-M6 cortex): 3 Total score (0-10 with 10 being normal): 10 These results were communicated to Dr. Wilford Corner at 1:31 pm on 05/22/2023 by text page  via the [employer] system. IMPRESSION: 1. No evidence of acute intracranial abnormality. ASPECTS of 10. 2. Chronic ischemia with old basal ganglia and cerebellar infarcts. Electronically Signed   By: Sebastian Ache M.D.   On: 05/22/2023 13:33     LOS: 19 days   Jeoffrey Massed, MD  [facility]    To contact the attending provider between 7A-7P or the covering provider during after hours 7P-7A, please log into the web site [email or web address] and access using universal  password for that web site. If you do not have the password, please call the hospital operator.  05/24/2023, 11:43 AM

## 2023-05-24 NOTE — Progress Notes (Signed)
[facility] NEPHROLOGY PROGRESS NOTE  Assessment/ Plan: Pt is a 72 y.o. yo female with a past medical history significant for hypertension, type II DM, PAD, CVA, HLD, CKD 3B who presented with shortness of breath and fluid overload.  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  OP HD arranged at [facility] [place] on TTS 11:30 chair time. Plan for PD later on it seems but will need to wait until cholecystitis is better. If now going to pursue PD will need AV access Last HD on 5/25 with 900 cc UF, plan for next dialysis tomorrow   # Acute hypoxic respiratory failure due to fluid overload: Received thoracocentesis and diuresis.  Now managing volume with HD.  # HTN/volume: BP acceptable.  No edema.  UF as tolerated.  Discontinue Lasix. Only on low dose coreg  # Anemia: Hemoglobin at goal-  no treatment needed   # Acute cholecystitis: HIDA scan was positive therefore on antibiotics.  Seen by general surgery and IR.  Planning cholecystostomy tube placement.  # Acute metabolic encephalopathy - resolved  Bones-  phos up-  calc WNL-  add binder.  No PTH checked-    Subjective: Seen and examined at bedside.  No new event.  She is alert awake.  No chest pain or shortness of breath. Family tells me they are trying to get her into rehab  Objective Vital signs in last 24 hours: Vitals:   05/23/23 1225 05/23/23 1600 05/23/23 2000 05/24/23 0500  BP: 107/73 127/63    Pulse: 72 68    Resp: 15 20    Temp: 98.5 F (36.9 C) (!) 97.4 F (36.3 C) 98.8 F (37.1 C)   TempSrc: Oral Oral Oral   SpO2: 95%     Weight:    59.1 kg  Height:       Weight change: -1.5 kg  Intake/Output Summary (Last 24 hours) at 05/24/2023 1103 Last data filed at 05/24/2023 0700 Gross per 24 hour  Intake --  Output 210 ml  Net -210 ml       Labs: RENAL PANEL Recent Labs  Lab  05/20/23 0422 05/21/23 0405 05/22/23 0308 05/22/23 0309 05/23/23 0354 05/24/23 0335  NA 130* 131*  --  131* 133* 134*  K 4.0 3.9  --  4.1 3.8 4.2  CL 92* 93*  --  92* 91* 91*  CO2 23 24  --  21* 21* 20*  GLUCOSE 209* 156*  --  182* 237* 243*  BUN 46* 31*  --  60* 46* 79*  CREATININE 4.90* 4.14*  --  5.87* 5.21* 7.32*  CALCIUM 9.4 9.1  --  9.3 9.1 9.2  PHOS 5.4* 5.3*  --  7.9* 7.0* 8.4*  ALBUMIN 3.2* 3.0* 2.7* 2.8* 2.8* 2.5*    Liver Function Tests: Recent Labs  Lab 05/21/23 0405 05/22/23 0308 05/22/23 0309 05/23/23 0354 05/24/23 0335  AST 39 33  --  68*  --   ALT 18 16  --  24  --   ALKPHOS 91 100  --  153*  --   BILITOT 1.1 1.0  --  1.0  --   PROT 7.1 7.5  --  7.7  --   ALBUMIN 3.0* 2.7* 2.8* 2.8* 2.5*   No results for input(s): "LIPASE", "AMYLASE" in the last 168 hours. Recent Labs  Lab 05/22/23 1621  AMMONIA 15   CBC:  Recent Labs    05/19/23 1132 05/21/23 0405 05/22/23 0308 05/23/23 0354 05/23/23 1335  HGB 10.9* 10.5* 10.7* 11.0* 10.8*  MCV 90.9 91.2 91.5 94.7 91.6    Cardiac Enzymes: No results for input(s): "CKTOTAL", "CKMB", "CKMBINDEX", "TROPONINI" in the last 168 hours. CBG: Recent Labs  Lab 05/23/23 1227 05/23/23 1325 05/23/23 1625 05/23/23 2125 05/24/23 0751  GLUCAP 164* 161* 199* 170* 188*    Iron Studies: No results for input(s): "IRON", "TIBC", "TRANSFERRIN", "FERRITIN" in the last 72 hours. Studies/Results: EEG adult  Result Date: 05/23/2023 Charlsie Quest, MD     05/23/2023  6:27 AM Patient Name: Samantha [name] MRN: [id number] Epilepsy Attending: Charlsie Quest Referring Physician/Provider: Maretta Bees, MD Date: 05/22/2023 Duration: 27.33 mins Patient history: 72yo F with ams getting eeg to evaluate for seizure. Level of alertness: lethargic AEDs during EEG study: None Technical aspects: This EEG study was done with scalp electrodes positioned according to the 10-20 International system of electrode placement. Electrical  activity was reviewed with band pass filter of 1-70Hz , sensitivity of 7 uV/mm, display speed of 45mm/sec with a 60Hz  notched filter applied as appropriate. EEG data were recorded continuously and digitally stored.  Video monitoring was available and reviewed as appropriate. Description: No clear posterior dominant rhythm was seen. EEG showed continuous generalized 3 to 7 Hz theta-delta slowing. Intermittent generalized periodic discharges with triphasic morphology at 2 Hz were also noted. Physiologic photic driving was not seen during photic stimulation.  Hyperventilation was not performed.   ABNORMALITY - Periodic discharges with triphasic morphology, generalized ( GPDs) - Continuous slow, generalized IMPRESSION: This study showed generalized periodic discharges with triphasic morphology which is most likely secondary to toxic-metabolic causes. However, the frequency of 2hz  can rarely evolve into ictal-interictal continuum. Recommend long term monitoring for better evaluation. Additionally there is moderate diffuse encephalopathy. No seizures were seen throughout the recording. Charlsie Quest   MR BRAIN WO CONTRAST  Result Date: 05/22/2023 CLINICAL DATA:  Neuro deficit, acute, stroke suspected.  Aphasia. EXAM: MRI HEAD WITHOUT CONTRAST MRA HEAD WITHOUT CONTRAST TECHNIQUE: Multiplanar, multi-echo pulse sequences of the brain and surrounding structures were acquired without intravenous contrast. Angiographic images of the Circle of Willis were acquired using MRA technique without intravenous contrast. COMPARISON:  Head CT 05/22/2023. Head MRI and MRA 11/08/2021. Head and neck CTA 11/09/2021. FINDINGS: MRI HEAD FINDINGS Brain: There is a 3 mm focus of restricted diffusion peripherally in the posterior left cerebellar hemisphere consistent with an acute infarct. There are a few punctate foci of mildly increased signal on axial trace diffusion weighted imaging without corresponding reduced ADC in the right greater  than left cerebral hemispheric white matter which may reflect subacute to chronic ischemia. A few chronic microhemorrhages are noted in the basal ganglia. There is a small chronic left cerebellar infarct with associated chronic blood products. T2 hyperintensities in the cerebral white matter bilaterally are similar to the prior MRI and are nonspecific but compatible with mild chronic small vessel ischemic disease. Chronic lacunar infarcts are noted in the right basal ganglia, left thalamus, and deep cerebral white matter. No mass, midline shift, or extra-axial fluid collection is identified. The ventricles and sulci are within normal limits for age. A partially empty sella is unchanged. Vascular: Major intracranial vascular flow voids are preserved. Skull and upper cervical spine: Unremarkable bone marrow signal. Sinuses/Orbits: Unremarkable orbits. No significant inflammatory changes in the paranasal sinuses. No significant mastoid fluid. Other: None. MRA HEAD FINDINGS Anterior circulation: The internal carotid arteries  are patent from skull base to carotid termini with up to mild chronic right paraclinoid stenosis. ACAs and MCAs are patent without evidence of a proximal branch occlusion. There is only mild narrowing of the distal left M1 segment where there was previously a severe stenosis. There is a severe stenosis of the origin of the left M2 superior division. There is a new severe mid to distal right A2 stenosis. No aneurysm is identified. Posterior circulation: The included portions of the intracranial vertebral arteries are patent with the right being dominant and with chronic moderate to severe stenoses of the left V4 segment with suspicion for a left vertebral dissection raised on the 2022 studies. Patent bilateral PICA, right AICA, and bilateral SCA origins are visualized. 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 There is a fetal origin of both PCAs both PCAs are patent with moderate bilateral P2 stenoses. No aneurysm is identified. Anatomic variants: Hypoplastic right A1 segment. IMPRESSION: 1. Punctate acute left cerebellar infarct. 2. 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 3. No large vessel occlusion. 4. Intracranial atherosclerosis including severe left M2 and right A2 stenoses and moderate left V4, basilar, and PCA stenoses. Electronically Signed   By: Sebastian Ache M.D.   On: 05/22/2023 15:48   MR ANGIO HEAD WO CONTRAST  Result Date: 05/22/2023 CLINICAL DATA:  Neuro deficit, acute, stroke suspected.  Aphasia. EXAM: MRI HEAD WITHOUT CONTRAST MRA HEAD WITHOUT CONTRAST TECHNIQUE: Multiplanar, multi-echo pulse sequences of the brain and surrounding structures were acquired without intravenous contrast. Angiographic images of the Circle of Willis were acquired using MRA technique without intravenous contrast. COMPARISON:  Head CT 05/22/2023. Head MRI and MRA 11/08/2021. Head and neck CTA 11/09/2021. FINDINGS: MRI HEAD FINDINGS Brain: There is a 3 mm focus of restricted diffusion peripherally in the posterior left cerebellar hemisphere consistent with an acute infarct. There are a few punctate foci of mildly increased signal on axial trace diffusion weighted imaging without corresponding reduced ADC in the right greater than left cerebral hemispheric white matter which may reflect subacute to chronic ischemia. A few chronic microhemorrhages are noted in the basal ganglia. There is a small chronic left cerebellar infarct with associated chronic blood products. T2 hyperintensities in the cerebral white matter bilaterally are similar to the prior MRI and are nonspecific but compatible with mild chronic small vessel ischemic disease. Chronic lacunar infarcts are noted in the right basal ganglia, left thalamus, and deep cerebral white matter. No mass,  midline shift, or extra-axial fluid collection is identified. The ventricles and sulci are within normal limits for age. A partially empty sella is unchanged. Vascular: Major intracranial vascular flow voids are preserved. Skull and upper cervical spine: Unremarkable bone marrow signal. Sinuses/Orbits: Unremarkable orbits. No significant inflammatory changes in the paranasal sinuses. No significant mastoid fluid. Other: None. MRA HEAD FINDINGS Anterior circulation: The internal carotid arteries are patent from skull base to carotid termini with up to mild chronic right paraclinoid stenosis. ACAs and MCAs are patent without evidence of a proximal branch occlusion. There is only mild narrowing of the distal left M1 segment where there was previously a severe stenosis. There is a severe stenosis of the origin of the left M2 superior division. There is a new severe mid to distal right A2 stenosis. No aneurysm is identified. Posterior circulation: The included portions of the intracranial vertebral arteries are patent with the right being dominant  and with chronic moderate to severe stenoses of the left V4 segment with suspicion for a left vertebral dissection raised on the 2022 studies. Patent bilateral PICA, right AICA, and bilateral SCA origins are visualized. 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 There is a fetal origin of both PCAs both PCAs are patent with moderate bilateral P2 stenoses. No aneurysm is identified. Anatomic variants: Hypoplastic right A1 segment. IMPRESSION: 1. Punctate acute left cerebellar infarct. 2. 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 3. No large vessel occlusion. 4. Intracranial atherosclerosis including severe left M2 and right A2 stenoses and moderate left V4, basilar, and PCA stenoses. Electronically Signed   By: Sebastian Ache  M.D.   On: 05/22/2023 15:48   CT HEAD CODE STROKE WO CONTRAST  Result Date: 05/22/2023 CLINICAL DATA:  Code stroke. Neuro deficit, acute, stroke suspected. Aphasia. L3 mental status. EXAM: CT HEAD WITHOUT CONTRAST TECHNIQUE: Contiguous axial images were obtained from the base of the skull through the vertex without intravenous contrast. RADIATION DOSE REDUCTION: This exam was performed according to the departmental dose-optimization program which includes automated exposure control, adjustment of the mA and/or kV according to patient size and/or use of iterative reconstruction technique. COMPARISON:  Head CT 04/14/2023 and MRI 11/08/2021 FINDINGS: Brain: There is no evidence of an acute infarct, intracranial hemorrhage, mass, midline shift, or extra-axial fluid collection. A chronic left cerebellar infarct and chronic bilateral basal ganglia lacunar infarcts are again noted. Mild hypodensities in the cerebral white matter bilaterally are unchanged and nonspecific but compatible with chronic small vessel ischemic disease. The ventricles and sulci are normal. Vascular: Calcified atherosclerosis at the skull base. No hyperdense vessel. Skull: No acute fracture or suspicious osseous lesion. Sinuses/Orbits: Minimal mucosal thickening in the paranasal sinuses. Clear mastoid air cells. Unremarkable orbits. Other: None. ASPECTS ([place] Stroke Program Early CT Score) - Ganglionic level infarction (caudate, lentiform nuclei, internal capsule, insula, M1-M3 cortex): 7 - Supraganglionic infarction (M4-M6 cortex): 3 Total score (0-10 with 10 being normal): 10 These results were communicated to Dr. Wilford Corner at 1:31 pm on 05/22/2023 by text page via the [facility] messaging system. IMPRESSION: 1. No evidence of acute intracranial abnormality. ASPECTS of 10. 2. Chronic ischemia with old basal ganglia and cerebellar infarcts. Electronically Signed   By: Sebastian Ache M.D.   On: 05/22/2023 13:33    Medications: Infusions:  sodium  chloride Stopped (05/09/23 2241)   piperacillin-tazobactam (ZOSYN)  IV 2.25 g (05/24/23 0714)    Scheduled Medications:  atorvastatin  80 mg Oral Daily   bisacodyl  10 mg Rectal Daily   carvedilol  6.25 mg Oral BID WC   Chlorhexidine Gluconate Cloth  6 each Topical Q0600   clopidogrel  75 mg Oral Daily   diphenhydrAMINE  25 mg Intravenous Once   docusate sodium  100 mg Oral BID   fluticasone  2 spray Each Nare Daily   guaiFENesin  600 mg Oral BID   heparin injection (subcutaneous)  5,000 Units Subcutaneous Q8H   insulin aspart  0-15 Units Subcutaneous TID WC   insulin aspart  0-5 Units Subcutaneous QHS   insulin aspart  3 Units Subcutaneous TID WC   insulin glargine-yfgn  15 Units Subcutaneous Daily   oxymetazoline  1 spray Each Nare Once   sodium chloride flush  3 mL Intravenous Q12H    have reviewed scheduled and prn medications.  Physical Exam: General:NAD, comfortable Heart:RRR,  s1s2 nl Lungs:clear b/l, no crackle Abdomen:soft, mild tenderness, not distended. Extremities:No edema Dialysis Access: Dialysis HD catheter in place.  Ilea Hilton A Anapaola Kinsel 05/24/2023,11:03 AM  LOS: 19 days

## 2023-05-24 NOTE — Progress Notes (Signed)
Pharmacy Antibiotic Note  Samantha [name] is a 72 y.o. female admitted on 05/05/2023 with  intra-abdominal infection .  Pharmacy has been consulted for Zosyn dosing. Patient with PMH of ESRD with dialysis TTS.  Plan: Zosyn 2.25 grams IV q8h Follow cultures and length of therapy  Height: 5\' 5"  (165.1 cm) Weight: 59.1 kg (130 lb 4.7 oz) IBW/kg (Calculated) : 57 kg  Temp (24hrs), Avg:98.2 F (36.8 C), Min:97.4 F (36.3 C), Max:98.8 F (37.1 C)  Recent Labs  Lab 05/19/23 1132 05/20/23 0422 05/21/23 0405 05/22/23 0308 05/22/23 0309 05/23/23 0354 05/23/23 1335 05/24/23 0335  WBC 12.8*  --  20.5* 20.7*  --  23.3* 25.2*  --   CREATININE  --  4.90* 4.14*  --  5.87* 5.21*  --  7.32*     Estimated Creatinine Clearance: 6.5 mL/min (A) (by C-G formula based on SCr of 7.32 mg/dL (H)).    Allergies  Allergen Reactions   Chicken Allergy Other (See Comments)    headache   Perflutren Other (See Comments)    Pt with back pain as soon as administered.- patient refuted this in 2024   Antimicrobials this admission: Zosyn 5/24 >>    Dose adjustments this admission: Zosyn for HD (TTS)  Thank you for allowing pharmacy to be a part of this patient's care.  Georga Hacking, Pharm.D PGY1 Pharmacy Resident  05/24/2023 12:05 PM

## 2023-05-24 NOTE — Progress Notes (Signed)
Subjective/Chief Complaint: No complaints. Feels better   Objective: Vital signs in last 24 hours: Temp:  [97.4 F (36.3 C)-98.8 F (37.1 C)] 98.8 F (37.1 C) (05/26 2000) Pulse Rate:  [68-81] 68 (05/26 1600) Resp:  [15-24] 20 (05/26 1600) BP: (102-158)/(62-87) 127/63 (05/26 1600) SpO2:  [93 %-98 %] 95 % (05/26 1225) Weight:  [59.1 kg] 59.1 kg (05/27 0500) Last BM Date : 05/22/23  Intake/Output from previous day: 05/26 0701 - 05/27 0700 In: 3 [I.V.:3] Out: 210 [Drains:210] Intake/Output this shift: No intake/output data recorded.  General appearance: alert and cooperative Resp: clear to auscultation bilaterally Cardio: regular rate and rhythm GI: soft, nontender. Drain output bilioius  Lab Results:  Recent Labs    05/23/23 0354 05/23/23 1335  WBC 23.3* 25.2*  HGB 11.0* 10.8*  HCT 32.2* 31.8*  PLT 340 351   BMET Recent Labs    05/23/23 0354 05/24/23 0335  NA 133* 134*  K 3.8 4.2  CL 91* 91*  CO2 21* 20*  GLUCOSE 237* 243*  BUN 46* 79*  CREATININE 5.21* 7.32*  CALCIUM 9.1 9.2   PT/INR No results for input(s): "LABPROT", "INR" in the last 72 hours. ABG Recent Labs    05/22/23 1436  PHART 7.54*  HCO3 24.8    Studies/Results: EEG adult  Result Date: 05/23/2023 Charlsie Quest, MD     05/23/2023  6:27 AM Patient Name: Samantha [name] MRN: [id number] Epilepsy Attending: Charlsie Quest Referring Physician/Provider: Maretta Bees, MD Date: 05/22/2023 Duration: 27.33 mins Patient history: 72yo F with ams getting eeg to evaluate for seizure. Level of alertness: lethargic AEDs during EEG study: None Technical aspects: This EEG study was done with scalp electrodes positioned according to the 10-20 International system of electrode placement. Electrical activity was reviewed with band pass filter of 1-70Hz , sensitivity of 7 uV/mm, display speed of 59mm/sec with a 60Hz  notched filter applied as appropriate. EEG data were recorded continuously and  digitally stored.  Video monitoring was available and reviewed as appropriate. Description: No clear posterior dominant rhythm was seen. EEG showed continuous generalized 3 to 7 Hz theta-delta slowing. Intermittent generalized periodic discharges with triphasic morphology at 2 Hz were also noted. Physiologic photic driving was not seen during photic stimulation.  Hyperventilation was not performed.   ABNORMALITY - Periodic discharges with triphasic morphology, generalized ( GPDs) - Continuous slow, generalized IMPRESSION: This study showed generalized periodic discharges with triphasic morphology which is most likely secondary to toxic-metabolic causes. However, the frequency of 2hz  can rarely evolve into ictal-interictal continuum. Recommend long term monitoring for better evaluation. Additionally there is moderate diffuse encephalopathy. No seizures were seen throughout the recording. Charlsie Quest   MR BRAIN WO CONTRAST  Result Date: 05/22/2023 CLINICAL DATA:  Neuro deficit, acute, stroke suspected.  Aphasia. EXAM: MRI HEAD WITHOUT CONTRAST MRA HEAD WITHOUT CONTRAST TECHNIQUE: Multiplanar, multi-echo pulse sequences of the brain and surrounding structures were acquired without intravenous contrast. Angiographic images of the Circle of Willis were acquired using MRA technique without intravenous contrast. COMPARISON:  Head CT 05/22/2023. Head MRI and MRA 11/08/2021. Head and neck CTA 11/09/2021. FINDINGS: MRI HEAD FINDINGS Brain: There is a 3 mm focus of restricted diffusion peripherally in the posterior left cerebellar hemisphere consistent with an acute infarct. There are a few punctate foci of mildly increased signal on axial trace diffusion weighted imaging without corresponding reduced ADC in the right greater than left cerebral hemispheric white matter which may reflect subacute to chronic ischemia. A few chronic  microhemorrhages are noted in the basal ganglia. There is a small chronic left cerebellar  infarct with associated chronic blood products. T2 hyperintensities in the cerebral white matter bilaterally are similar to the prior MRI and are nonspecific but compatible with mild chronic small vessel ischemic disease. Chronic lacunar infarcts are noted in the right basal ganglia, left thalamus, and deep cerebral white matter. No mass, midline shift, or extra-axial fluid collection is identified. The ventricles and sulci are within normal limits for age. A partially empty sella is unchanged. Vascular: Major intracranial vascular flow voids are preserved. Skull and upper cervical spine: Unremarkable bone marrow signal. Sinuses/Orbits: Unremarkable orbits. No significant inflammatory changes in the paranasal sinuses. No significant mastoid fluid. Other: None. MRA HEAD FINDINGS Anterior circulation: The internal carotid arteries are patent from skull base to carotid termini with up to mild chronic right paraclinoid stenosis. ACAs and MCAs are patent without evidence of a proximal branch occlusion. There is only mild narrowing of the distal left M1 segment where there was previously a severe stenosis. There is a severe stenosis of the origin of the left M2 superior division. There is a new severe mid to distal right A2 stenosis. No aneurysm is identified. Posterior circulation: The included portions of the intracranial vertebral arteries are patent with the right being dominant and with chronic moderate to severe stenoses of the left V4 segment with suspicion for a left vertebral dissection raised on the 2022 studies. Patent bilateral PICA, right AICA, and bilateral SCA origins are visualized. 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 There is a fetal origin of both PCAs both PCAs are patent with moderate bilateral P2 stenoses. No aneurysm is identified. Anatomic variants: Hypoplastic right A1  segment. IMPRESSION: 1. Punctate acute left cerebellar infarct. 2. 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 3. No large vessel occlusion. 4. Intracranial atherosclerosis including severe left M2 and right A2 stenoses and moderate left V4, basilar, and PCA stenoses. Electronically Signed   By: Sebastian Ache M.D.   On: 05/22/2023 15:48   MR ANGIO HEAD WO CONTRAST  Result Date: 05/22/2023 CLINICAL DATA:  Neuro deficit, acute, stroke suspected.  Aphasia. EXAM: MRI HEAD WITHOUT CONTRAST MRA HEAD WITHOUT CONTRAST TECHNIQUE: Multiplanar, multi-echo pulse sequences of the brain and surrounding structures were acquired without intravenous contrast. Angiographic images of the Circle of Willis were acquired using MRA technique without intravenous contrast. COMPARISON:  Head CT 05/22/2023. Head MRI and MRA 11/08/2021. Head and neck CTA 11/09/2021. FINDINGS: MRI HEAD FINDINGS Brain: There is a 3 mm focus of restricted diffusion peripherally in the posterior left cerebellar hemisphere consistent with an acute infarct. There are a few punctate foci of mildly increased signal on axial trace diffusion weighted imaging without corresponding reduced ADC in the right greater than left cerebral hemispheric white matter which may reflect subacute to chronic ischemia. A few chronic microhemorrhages are noted in the basal ganglia. There is a small chronic left cerebellar infarct with associated chronic blood products. T2 hyperintensities in the cerebral white matter bilaterally are similar to the prior MRI and are nonspecific but compatible with mild chronic small vessel ischemic disease. Chronic lacunar infarcts are noted in the right basal ganglia, left thalamus, and deep cerebral white matter. No mass, midline shift, or extra-axial fluid collection is identified. The ventricles and sulci are within normal limits for age. A partially empty sella is unchanged. Vascular: Major intracranial  vascular  flow voids are preserved. Skull and upper cervical spine: Unremarkable bone marrow signal. Sinuses/Orbits: Unremarkable orbits. No significant inflammatory changes in the paranasal sinuses. No significant mastoid fluid. Other: None. MRA HEAD FINDINGS Anterior circulation: The internal carotid arteries are patent from skull base to carotid termini with up to mild chronic right paraclinoid stenosis. ACAs and MCAs are patent without evidence of a proximal branch occlusion. There is only mild narrowing of the distal left M1 segment where there was previously a severe stenosis. There is a severe stenosis of the origin of the left M2 superior division. There is a new severe mid to distal right A2 stenosis. No aneurysm is identified. Posterior circulation: The included portions of the intracranial vertebral arteries are patent with the right being dominant and with chronic moderate to severe stenoses of the left V4 segment with suspicion for a left vertebral dissection raised on the 2022 studies. Patent bilateral PICA, right AICA, and bilateral SCA origins are visualized. 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 There is a fetal origin of both PCAs both PCAs are patent with moderate bilateral P2 stenoses. No aneurysm is identified. Anatomic variants: Hypoplastic right A1 segment. IMPRESSION: 1. Punctate acute left cerebellar infarct. 2. 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 3. No large vessel occlusion. 4. Intracranial atherosclerosis including severe left M2 and right A2 stenoses and moderate left V4, basilar, and PCA stenoses. Electronically Signed   By: Sebastian Ache M.D.   On: 05/22/2023 15:48   CT HEAD CODE STROKE WO CONTRAST  Result Date: 05/22/2023 CLINICAL DATA:  Code stroke. Neuro deficit, acute, stroke suspected. Aphasia. L3 mental status. EXAM: CT HEAD  WITHOUT CONTRAST TECHNIQUE: Contiguous axial images were obtained from the base of the skull through the vertex without intravenous contrast. RADIATION DOSE REDUCTION: This exam was performed according to the departmental dose-optimization program which includes automated exposure control, adjustment of the mA and/or kV according to patient size and/or use of iterative reconstruction technique. COMPARISON:  Head CT 04/14/2023 and MRI 11/08/2021 FINDINGS: Brain: There is no evidence of an acute infarct, intracranial hemorrhage, mass, midline shift, or extra-axial fluid collection. A chronic left cerebellar infarct and chronic bilateral basal ganglia lacunar infarcts are again noted. Mild hypodensities in the cerebral white matter bilaterally are unchanged and nonspecific but compatible with chronic small vessel ischemic disease. The ventricles and sulci are normal. Vascular: Calcified atherosclerosis at the skull base. No hyperdense vessel. Skull: No acute fracture or suspicious osseous lesion. Sinuses/Orbits: Minimal mucosal thickening in the paranasal sinuses. Clear mastoid air cells. Unremarkable orbits. Other: None. ASPECTS ([place]ta Stroke Program Early CT Score) - Ganglionic level infarction (caudate, lentiform nuclei, internal capsule, insula, M1-M3 cortex): 7 - Supraganglionic infarction (M4-M6 cortex): 3 Total score (0-10 with 10 being normal): 10 These results were communicated to Dr. Wilford Corner at 1:31 pm on 05/22/2023 by text page via the [facility] messaging system. IMPRESSION: 1. No evidence of acute intracranial abnormality. ASPECTS of 10. 2. Chronic ischemia with old basal ganglia and cerebellar infarcts. Electronically Signed   By: Sebastian Ache M.D.   On: 05/22/2023 13:33    Anti-infectives: Anti-infectives (From admission, onward)    Start     Dose/Rate Route Frequency Ordered Stop   05/21/23 1830  piperacillin-tazobactam (ZOSYN) IVPB 2.25 g        2.25 g 100 mL/hr over 30 Minutes Intravenous Every 8  hours 05/21/23 1740  05/17/23 1500  ceFAZolin (ANCEF) IVPB 2g/100 mL premix        2 g 200 mL/hr over 30 Minutes Intravenous To Radiology 05/16/23 1204 05/17/23 1537   05/08/23 1000  ceFAZolin (ANCEF) IVPB 2g/100 mL premix        2 g 200 mL/hr over 30 Minutes Intravenous To Radiology 05/07/23 1537 05/09/23 1000   05/05/23 1515  cefTRIAXone (ROCEPHIN) 1 g in sodium chloride 0.9 % 100 mL IVPB        1 g 200 mL/hr over 30 Minutes Intravenous  Once 05/05/23 1510 05/05/23 1642   05/05/23 1515  azithromycin (ZITHROMAX) 500 mg in sodium chloride 0.9 % 250 mL IVPB        500 mg 250 mL/hr over 60 Minutes Intravenous  Once 05/05/23 1510 05/05/23 1750       Assessment/Plan: s/p * No surgery found * Advance diet Continue perc drain and abx until wbc normalizes Will need follow up with surgery in 2 weeks after d/c and drain clinic Will follow  LOS: 19 days    Chevis Pretty III 05/24/2023

## 2023-05-24 NOTE — Progress Notes (Signed)
Inpatient Rehab Admissions Coordinator:  ° °Patient was screened for CIR candidacy by Aliese Brannum, MS, CCC-SLP. At this time, Pt. Appears to be a a potential candidate for CIR. I will place  order for rehab consult per protocol for full assessment. Please contact me any with questions. ° °Kamya Watling, MS, CCC-SLP °Rehab Admissions Coordinator  °[phone] (celll) °[phone] (office) ° °

## 2023-05-24 NOTE — Progress Notes (Signed)
Physical Therapy Treatment Patient Details Name: Samantha [name] MRN: [id number] DOB: [birth year] Today's Date: 05/24/2023   History of Present Illness 72 y.o. female who presented on 5/8 with acute hypoxic respiratory failure in the setting of pulm edema with worsening renal function. CT of chest found new bilateral pleural effusions, right> left parenchymal opacities. 5/11 thoracocentesis. PMHx: HTN, HLD, PAD, DM2, CVA, depression/anxiety.    PT Comments    Pt greeted resting in bed and agreeable to session, however pt continues to be limited by RUQ pain, weakness, decreased activity tolerance, and impaired balance/postural reactions. Pt requiring mod A for bed mobility and to come to standing with RW for support. Pt able to take a few steps away from EOB and back, and side step toward [facility] with mod A to steady throughout and step by step cues for sequencing to complete. Pt continues to require extended seated rest breaks between all tasks due to fatigue and cues for breathing techniques. Discussed with supervising PT and updated recommendation to address deficits, maximize functional independence and decrease caregiver burden. Pt continues to benefit from skilled PT services to progress toward functional mobility goals.    Recommendations for follow up therapy are one component of a multi-disciplinary discharge planning process, led by the attending physician.  Recommendations may be updated based on patient status, additional functional criteria and insurance authorization.  Follow Up Recommendations       Assistance Recommended at Discharge Frequent or constant Supervision/Assistance  Patient can return home with the following A lot of help with walking and/or transfers;A little help with bathing/dressing/bathroom;Assistance with cooking/housework;Help with stairs or ramp for entrance;Assist for transportation   Equipment Recommendations  Other (comment) (TBD)    Recommendations for  Other Services OT consult     Precautions / Restrictions Precautions Precautions: Fall Restrictions Weight Bearing Restrictions: No     Mobility  Bed Mobility Overal bed mobility: Needs Assistance Bed Mobility: Supine to Sit, Sit to Supine     Supine to sit: Mod assist, HOB elevated Sit to supine: Min assist   General bed mobility comments: assist for trunk support into sitting and BLE management for return to supine    Transfers Overall transfer level: Needs assistance Equipment used: Rolling walker (2 wheels) Transfers: Sit to/from Stand Sit to Stand: Min assist, Mod assist           General transfer comment: modA to power up and steady, initial posterior lean. improving with subsequent stands, down to min A    Ambulation/Gait Ambulation/Gait assistance: Mod assist Gait Distance (Feet): 3 Feet Assistive device: Rolling walker (2 wheels) Gait Pattern/deviations: Step-through pattern, Step-to pattern       General Gait Details: pt able to take a few steps forward away from back and back to EOB, able to side-step toward [facility] with mod A to steady and cues for sequencing each step   Stairs             Wheelchair Mobility    Modified Rankin (Stroke Patients Only)       Balance Overall balance assessment: Needs assistance Sitting-balance support: Bilateral upper extremity supported, Feet supported Sitting balance-Leahy Scale: Fair     Standing balance support: Bilateral upper extremity supported Standing balance-Leahy Scale: Poor Standing balance comment: requiring physical assist and BUE support for static standing                            Cognition Arousal/Alertness: Awake/alert Behavior During Therapy:  Flat affect Overall Cognitive Status: Within Functional Limits for tasks assessed                                 General Comments: limited by pain throughout session and requires increased time and motivation but  able to follow commands through daughter's interpretation        Exercises      General Comments General comments (skin integrity, edema, etc.): VSS on room air, daughter present throughout session and preferring to provide interpretation      Pertinent Vitals/Pain Pain Assessment Pain Assessment: No/denies pain    Home Living                          Prior Function            PT Goals (current goals can now be found in the care plan section) Acute Rehab PT Goals Patient Stated Goal: relieve pain and go home PT Goal Formulation: With patient/family Time For Goal Achievement: 06/04/23 Progress towards PT goals: Progressing toward goals    Frequency    Min 3X/week      PT Plan Discharge plan needs to be updated    Co-evaluation              AM-PAC PT "6 Clicks" Mobility   Outcome Measure  Help needed turning from your back to your side while in a flat bed without using bedrails?: A Little Help needed moving from lying on your back to sitting on the side of a flat bed without using bedrails?: A Lot Help needed moving to and from a bed to a chair (including a wheelchair)?: A Lot Help needed standing up from a chair using your arms (e.g., wheelchair or bedside chair)?: A Lot Help needed to walk in hospital room?: A Lot Help needed climbing 3-5 steps with a railing? : Total 6 Click Score: 12    End of Session Equipment Utilized During Treatment: Gait belt Activity Tolerance: Patient limited by pain Patient left: in bed;with call bell/phone within reach;with bed alarm set;with family/visitor present Nurse Communication: Mobility status PT Visit Diagnosis: Muscle weakness (generalized) (M62.81);Difficulty in walking, not elsewhere classified (R26.2);Other abnormalities of gait and mobility (R26.89);Pain Pain - Right/Left: Right Pain - part of body:  (RUQ)     Time: 1040-1107 PT Time Calculation (min) (ACUTE ONLY): 27 min  Charges:   $Therapeutic Activity: 23-37 mins                     Deaja Rizo R. PTA Acute Rehabilitation Services Office: [phone]   Catalina Antigua 05/24/2023, 11:25 AM

## 2023-05-25 DIAGNOSIS — N179 Acute kidney failure, unspecified: Secondary | ICD-10-CM

## 2023-05-25 DIAGNOSIS — Z8673 Personal history of transient ischemic attack (TIA), and cerebral infarction without residual deficits: Secondary | ICD-10-CM | POA: Diagnosis not present

## 2023-05-25 DIAGNOSIS — J9601 Acute respiratory failure with hypoxia: Secondary | ICD-10-CM | POA: Diagnosis not present

## 2023-05-25 DIAGNOSIS — I10 Essential (primary) hypertension: Secondary | ICD-10-CM | POA: Diagnosis not present

## 2023-05-25 LAB — RENAL FUNCTION PANEL
Albumin: 2.1 g/dL — ABNORMAL LOW (ref 3.5–5.0)
Anion gap: 23 — ABNORMAL HIGH (ref 5–15)
Anion gap: 23 — ABNORMAL HIGH (ref 5–15)
BUN: 79 mg/dL — ABNORMAL HIGH (ref 8–23)
BUN: 95 mg/dL — ABNORMAL HIGH (ref 8–23)
CO2: 20 mmol/L — ABNORMAL LOW (ref 22–32)
Calcium: 9.2 mg/dL (ref 8.9–10.3)
Calcium: 8.1 mg/dL — ABNORMAL LOW (ref 8.9–10.3)
Chloride: 91 mmol/L — ABNORMAL LOW (ref 98–111)
Chloride: 85 mmol/L — ABNORMAL LOW (ref 98–111)
Creatinine, Ser: 8.39 mg/dL — ABNORMAL HIGH (ref 0.44–1.00)
GFR, Estimated: 6 mL/min — ABNORMAL LOW (ref >60)
GFR, Estimated: 5 mL/min — ABNORMAL LOW (ref >60)
Glucose, Bld: 243 mg/dL — ABNORMAL HIGH (ref 70–99)
Glucose, Bld: 117 mg/dL — ABNORMAL HIGH (ref 70–99)
Phosphorus: 7.9 mg/dL — ABNORMAL HIGH (ref 2.5–4.6)
Potassium: 3.6 mmol/L (ref 3.5–5.1)
Sodium: 134 mmol/L — ABNORMAL LOW (ref 135–145)
Sodium: 128 mmol/L — ABNORMAL LOW (ref 135–145)

## 2023-05-25 LAB — CBC
HCT: 28.4 % — ABNORMAL LOW (ref 36.0–46.0)
Hemoglobin: 10 g/dL — ABNORMAL LOW (ref 12.0–15.0)
MCH: 31.5 pg (ref 26.0–34.0)
MCHC: 35.2 g/dL (ref 30.0–36.0)
MCV: 89.6 fL (ref 80.0–100.0)
Platelets: 341 10*3/uL (ref 150–400)
RBC: 3.17 MIL/uL — ABNORMAL LOW (ref 3.87–5.11)
RDW: 12.7 % (ref 11.5–15.5)
WBC: 14.1 10*3/uL — ABNORMAL HIGH (ref 4.0–10.5)
nRBC: 0 % (ref 0.0–0.2)

## 2023-05-25 LAB — GLUCOSE, CAPILLARY
Glucose-Capillary: 98 mg/dL (ref 70–99)
Glucose-Capillary: 172 mg/dL — ABNORMAL HIGH (ref 70–99)
Glucose-Capillary: 89 mg/dL (ref 70–99)

## 2023-05-25 LAB — AEROBIC/ANAEROBIC CULTURE W GRAM STAIN (SURGICAL/DEEP WOUND): Gram Stain: NONE SEEN

## 2023-05-25 LAB — IRON AND TIBC
Iron: 13 ug/dL — ABNORMAL LOW (ref 28–170)
Saturation Ratios: 8 % — ABNORMAL LOW (ref 10.4–31.8)
TIBC: 172 ug/dL — ABNORMAL LOW (ref 250–450)
UIBC: 159 ug/dL

## 2023-05-25 LAB — FERRITIN: Ferritin: 632 ng/mL — ABNORMAL HIGH (ref 11–307)

## 2023-05-25 MED ORDER — HEPARIN SODIUM (PORCINE) 1000 UNIT/ML IJ SOLN: 3200.0000 [IU] | Freq: Once | INTRAMUSCULAR | Status: DC

## 2023-05-25 MED ORDER — POLYETHYLENE GLYCOL 3350 17 G PO PACK
17.0000 g | PACK | Freq: Two times a day (BID) | ORAL | Status: DC
  Administered 2023-05-25 – 2023-05-28 (×6): 17 g via ORAL
  Filled 2023-05-25 (×8): qty 1

## 2023-05-25 MED ORDER — HEPARIN SODIUM (PORCINE) 1000 UNIT/ML DIALYSIS
20.0000 [IU]/kg | INTRAMUSCULAR | Status: DC | PRN
  Administered 2023-05-25: 1200 [IU] via INTRAVENOUS_CENTRAL
  Filled 2023-05-25 (×2): qty 2

## 2023-05-25 MED ORDER — AMOXICILLIN-POT CLAVULANATE 500-125 MG PO TABS
1.0000 | ORAL_TABLET | Freq: Two times a day (BID) | ORAL | Status: DC
  Administered 2023-05-25 – 2023-05-28 (×6): 1 via ORAL
  Filled 2023-05-25 (×8): qty 1

## 2023-05-25 NOTE — Progress Notes (Signed)
PT Cancellation Note  Patient Details Name: Samantha [name] MRN: [id number] DOB: September 07, 1951   Cancelled Treatment:    Reason Eval/Treat Not Completed: (P) Patient at procedure or test/unavailable, pt off unit at HD. Will check back as schedule allows to continue with PT POC.  Lenora Boys. PTA Acute Rehabilitation Services Office: [phone]    Catalina Antigua 05/25/2023, 10:16 AM

## 2023-05-25 NOTE — Progress Notes (Signed)
Ok to transition zosyn to Augmentin to complete 7d of total abx for post chlolecystectomy per Dr. Jerral Ralph.  Augmentin 500mg  PO BID x 7 doses  Ulyses Southward, PharmD, [place], AAHIVP, CPP Infectious Disease Pharmacist 05/25/2023 11:52 AM

## 2023-05-25 NOTE — Procedures (Signed)
Patient was seen on dialysis and the procedure was supervised.  BFR 300  Via TDC BP is  131/66.   Patient appears to be tolerating treatment well  Cecille Aver 05/25/2023

## 2023-05-25 NOTE — Progress Notes (Signed)
PROGRESS NOTE        PATIENT DETAILS Name: Samantha [name] Age: 72 y.o. Sex: female Date of Birth: [birth year] Admit Date: 05/05/2023 Admitting Physician Meredeth Ide, MD ZOX:[id number], Odette Horns, MD  Brief Summary: Patient is a 72 y.o.  female with history of HTN, DM-2, PAD, CVA, depression/anxiety-who presented with acute hypoxic respiratory failure in the setting of pulm edema with worsening renal function.  Unfortunately-thought to have progressed to ESRD-requiring HD, for the hospital course complicated by development of acute metabolic encephalopathy, and acute calculus cholecystitis.    Significant events: 5/08>> admit to [facility]  Significant studies: 5/08>> CT chest/abdomen/pelvis: New bilateral pleural effusions, right> left parenchymal opacities 5/08>> renal ultrasound: No hydronephrosis 5/09>>Echo: EF 55-60% 5/12>> CT chest: Increasing interstitial/patchy groundglass opacities-decreasing small bilateral pleural effusions. 5/14>> gastric emptying study: Nondiagnostic due to emesis 5/24>> HIDA scan: Non- visualization of cystic duct 5/25>> CT head: No acute intracranial abnormality. 5/25>> MRA brain: Intracranial atherosclerosis including severe left M2/right A2 stenosis, moderate left V4, basilar and PCA stenosis. 5/25>> MRI brain: Punctate acute left cerebellar infarct. 5/26>> LTM EEG: No seizures.  Significant microbiology data: 5/08>> COVID PCR: Negative 5/08>> blood culture: Negative 5/11>> pleural fluid culture: No growth 5/22>> blood culture: No growth  Procedures: 5/11>> thoracocentesis (pleural fluid cytology negative) 5/11>> nontunneled dialysis catheter by PCCM 5/20>> tunneled dialysis catheter by IR 5/26>> cholecystostomy tube by IR.  Consults: PCCM Nephrology Interventional radiology General surgery Neurology  Subjective: Mild RUQ pain-tolerating diet no other complaint-awake and alert.  Daughter at  bedside.  Objective: Vitals: Blood pressure 102/71, pulse 85, temperature 98.2 F (36.8 C), temperature source Oral, resp. rate 16, height 5\' 5"  (1.651 m), weight 57.8 kg, SpO2 94 %.   Exam: Gen Exam:Alert awake-not in any distress HEENT:atraumatic, normocephalic Chest: B/L clear to auscultation anteriorly CVS:S1S2 regular Abdomen: Soft-mild RUQ pain. Extremities:no edema Neurology: Non focal Skin: no rash  Pertinent Labs/Radiology:    Latest Ref Rng & Units 05/25/2023    2:18 AM 05/23/2023    1:35 PM 05/23/2023    3:54 AM  CBC  WBC 4.0 - 10.5 K/uL 14.1  25.2  23.3   Hemoglobin 12.0 - 15.0 g/dL 40.9  81.1  91.4   Hematocrit 36.0 - 46.0 % 28.4  31.8  32.2   Platelets 150 - 400 K/uL 341  351  340     Lab Results  Component Value Date   NA 128 (L) 05/25/2023   K 3.6 05/25/2023   CL 85 (L) 05/25/2023   CO2 20 (L) 05/25/2023      Assessment/Plan: AKI on CKD stage IIIb Likely hemodynamically mediated-suspicion that she may have progressed to ESRD Nephrology following and directing HD scheduled/care. Continue to monitor for signs of renal recovery  Acute hypoxic respiratory failure Secondary to acute pulm edema in the setting of worsening renal function/pleural effusion.  (PNA ruled out) Volume being managed with HD-dose IV Lasix Currently improved-and on room air  Acute calculus cholecystitis  Had low-grade fever on 5/22 and subsequently worsening leukocytosis/RUQ pain on 5/24-HIDA scan positive-evaluated by general surgery-Plavix held-underwent cholecystostomy tube placement by IR on 5/26 Initially on Zosyn-should be able to transition to Augmentin-plan on total of 7 days of treatment. Follow CBC  Acute metabolic encephalopathy Had significant worsening of mental status at the end of HD on 5/25-neuroimaging negative for CVA, LTM EEG negative for seizures.  Suspicion  is for encephalopathy in the setting of cholecystitis.  Thankfully after placement of cholecystostomy  tube-she is significantly improved-and completely awake and alert. MRI brain showed a punctate acute infarct-not clear if this is artifactual or a insignificant/incidental finding.  No further recommendations by neurology at this point.  Hyponatremia Due to impaired water excretion in the setting of worsening renal function Improving with ongoing volume removal with hemodialysis.  Hypokalemia Repleted  Acute on chronic HFrEF Acute pulm edema Volume status stable with HD  History of CAD No anginal symptoms Echo with stable EF Continue Plavix/beta-blocker/statin.  HTN BP stable on Coreg Follow/optimize  HLD Statin  DM-2 (A1c 7.7 on 4/18) CBGs stable on 15 units of Semglee daily, 3 units of NovoLog with meals and SSI  Recent Labs    05/24/23 1224 05/24/23 1655 05/24/23 2019  GLUCAP 127* 91 142*     Subcentimeter left thyroid nodule Per radiology-not clinically significant-no follow-up recommendations.  BMI: Estimated body mass index is 21.2 kg/m as calculated from the following:   Height as of this encounter: 5\' 5"  (1.651 m).   Weight as of this encounter: 57.8 kg.   Code status:   Code Status: Full Code   DVT Prophylaxis: heparin injection 5,000 Units Start: 05/23/23 2200   Family Communication: Daughter at bedside.  Disposition Plan: Status is: Inpatient Remains inpatient appropriate because: Severity of illness   Planned Discharge Destination:Home   Diet: Diet Order             Diet renal/carb modified with fluid restriction Diet-HS Snack? Nothing; Fluid restriction: 1200 mL Fluid; Room service appropriate? Yes; Fluid consistency: Thin  Diet effective now                     Antimicrobial agents: Anti-infectives (From admission, onward)    Start     Dose/Rate Route Frequency Ordered Stop   05/21/23 1830  piperacillin-tazobactam (ZOSYN) IVPB 2.25 g        2.25 g 100 mL/hr over 30 Minutes Intravenous Every 8 hours 05/21/23 1740      05/17/23 1500  ceFAZolin (ANCEF) IVPB 2g/100 mL premix        2 g 200 mL/hr over 30 Minutes Intravenous To Radiology 05/16/23 1204 05/17/23 1537   05/08/23 1000  ceFAZolin (ANCEF) IVPB 2g/100 mL premix        2 g 200 mL/hr over 30 Minutes Intravenous To Radiology 05/07/23 1537 05/09/23 1000   05/05/23 1515  cefTRIAXone (ROCEPHIN) 1 g in sodium chloride 0.9 % 100 mL IVPB        1 g 200 mL/hr over 30 Minutes Intravenous  Once 05/05/23 1510 05/05/23 1642   05/05/23 1515  azithromycin (ZITHROMAX) 500 mg in sodium chloride 0.9 % 250 mL IVPB        500 mg 250 mL/hr over 60 Minutes Intravenous  Once 05/05/23 1510 05/05/23 1750        MEDICATIONS: Scheduled Meds:  atorvastatin  80 mg Oral Daily   bisacodyl  10 mg Rectal Daily   carvedilol  6.25 mg Oral BID WC   Chlorhexidine Gluconate Cloth  6 each Topical Q0600   clopidogrel  75 mg Oral Daily   diphenhydrAMINE  25 mg Intravenous Once   docusate sodium  100 mg Oral BID   fluticasone  2 spray Each Nare Daily   guaiFENesin  600 mg Oral BID   heparin injection (subcutaneous)  5,000 Units Subcutaneous Q8H   insulin aspart  0-15 Units Subcutaneous TID  WC   insulin aspart  0-5 Units Subcutaneous QHS   insulin aspart  3 Units Subcutaneous TID WC   insulin glargine-yfgn  15 Units Subcutaneous Daily   oxymetazoline  1 spray Each Nare Once   polyethylene glycol  17 g Oral BID   sevelamer carbonate  1,600 mg Oral TID WC   sodium chloride flush  3 mL Intravenous Q12H   Continuous Infusions:  sodium chloride Stopped (05/09/23 2241)   piperacillin-tazobactam (ZOSYN)  IV 2.25 g (05/25/23 0611)   PRN Meds:.sodium chloride, acetaminophen, albuterol, heparin, HYDROmorphone (DILAUDID) injection, iohexol, LORazepam, melatonin, ondansetron **OR** ondansetron (ZOFRAN) IV, mouth rinse, oxyCODONE, sodium chloride, sodium chloride flush   I have personally reviewed following labs and imaging studies  LABORATORY DATA: CBC: Recent Labs  Lab  05/21/23 0405 05/22/23 0308 05/23/23 0354 05/23/23 1335 05/25/23 0218  WBC 20.5* 20.7* 23.3* 25.2* 14.1*  NEUTROABS  --   --   --  22.3*  --   HGB 10.5* 10.7* 11.0* 10.8* 10.0*  HCT 30.0* 31.1* 32.2* 31.8* 28.4*  MCV 91.2 91.5 94.7 91.6 89.6  PLT 272 322 340 351 341     Basic Metabolic Panel: Recent Labs  Lab 05/21/23 0405 05/22/23 0309 05/23/23 0354 05/24/23 0335 05/25/23 0218  NA 131* 131* 133* 134* 128*  K 3.9 4.1 3.8 4.2 3.6  CL 93* 92* 91* 91* 85*  CO2 24 21* 21* 20* 20*  GLUCOSE 156* 182* 237* 243* 117*  BUN 31* 60* 46* 79* 95*  CREATININE 4.14* 5.87* 5.21* 7.32* 8.39*  CALCIUM 9.1 9.3 9.1 9.2 8.1*  PHOS 5.3* 7.9* 7.0* 8.4* 7.9*     GFR: Estimated Creatinine Clearance: 5.7 mL/min (A) (by C-G formula based on SCr of 8.39 mg/dL (H)).  Liver Function Tests: Recent Labs  Lab 05/21/23 0405 05/22/23 0308 05/22/23 0309 05/23/23 0354 05/24/23 0335 05/25/23 0218  AST 39 33  --  68*  --   --   ALT 18 16  --  24  --   --   ALKPHOS 91 100  --  153*  --   --   BILITOT 1.1 1.0  --  1.0  --   --   PROT 7.1 7.5  --  7.7  --   --   ALBUMIN 3.0* 2.7* 2.8* 2.8* 2.5* 2.1*    No results for input(s): "LIPASE", "AMYLASE" in the last 168 hours. Recent Labs  Lab 05/22/23 1621  AMMONIA 15     Coagulation Profile: No results for input(s): "INR", "PROTIME" in the last 168 hours.   Cardiac Enzymes: No results for input(s): "CKTOTAL", "CKMB", "CKMBINDEX", "TROPONINI" in the last 168 hours.  BNP (last 3 results) No results for input(s): "PROBNP" in the last 8760 hours.  Lipid Profile: No results for input(s): "CHOL", "HDL", "LDLCALC", "TRIG", "CHOLHDL", "LDLDIRECT" in the last 72 hours.  Thyroid Function Tests: No results for input(s): "TSH", "T4TOTAL", "FREET4", "T3FREE", "THYROIDAB" in the last 72 hours.  Anemia Panel: Recent Labs    05/25/23 0218  FERRITIN 632*  TIBC 172*  IRON 13*    Urine analysis:    Component Value Date/Time   COLORURINE  YELLOW 05/05/2023 0927   APPEARANCEUR CLEAR 05/05/2023 0927   LABSPEC 1.010 05/05/2023 0927   PHURINE 6.0 05/05/2023 0927   GLUCOSEU 50 (A) 05/05/2023 0927   HGBUR NEGATIVE 05/05/2023 0927   BILIRUBINUR NEGATIVE 05/05/2023 0927   BILIRUBINUR negative 08/18/2021 1207   BILIRUBINUR neg 06/28/2017 1508   KETONESUR NEGATIVE 05/05/2023 0927   PROTEINUR  100 (A) 05/05/2023 0927   UROBILINOGEN 0.2 08/18/2021 1207   UROBILINOGEN 0.2 01/15/2015 1032   NITRITE NEGATIVE 05/05/2023 0927   LEUKOCYTESUR NEGATIVE 05/05/2023 1610    Sepsis Labs: Lactic Acid, Venous    Component Value Date/Time   LATICACIDVEN 2.22 (H) 01/10/2015 1339    MICROBIOLOGY: Recent Results (from the past 240 hour(s))  Culture, blood (Routine X 2) w Reflex to ID Panel     Status: None   Collection Time: 05/19/23  6:06 PM   Specimen: BLOOD LEFT ARM  Result Value Ref Range Status   Specimen Description BLOOD LEFT ARM  Final   Special Requests   Final    BOTTLES DRAWN AEROBIC AND ANAEROBIC Blood Culture adequate volume   Culture   Final    NO GROWTH 5 DAYS Performed at [facility], [address], [place], [place] [address]    Report Status 05/24/2023 FINAL  Final  Culture, blood (Routine X 2) w Reflex to ID Panel     Status: None   Collection Time: 05/19/23  6:06 PM   Specimen: BLOOD LEFT ARM  Result Value Ref Range Status   Specimen Description BLOOD LEFT ARM  Final   Special Requests   Final    BOTTLES DRAWN AEROBIC AND ANAEROBIC Blood Culture adequate volume   Culture   Final    NO GROWTH 5 DAYS Performed at [facility], [address] [place], [place] [address]    Report Status 05/24/2023 FINAL  Final  Aerobic/Anaerobic Culture w Gram Stain (surgical/deep wound)     Status: None (Preliminary result)   Collection Time: 05/23/23 11:27 AM   Specimen: BILE  Result Value Ref Range Status   Specimen Description BILE  Final   Special Requests NONE  Final   Gram Stain NO WBC SEEN NO ORGANISMS  SEEN   Final   Culture   Final    NO GROWTH 2 DAYS NO ANAEROBES ISOLATED; CULTURE IN PROGRESS FOR 5 DAYS Performed at [facility], [address], [place], [place] [address]    Report Status PENDING  Incomplete    RADIOLOGY STUDIES/RESULTS: No results found.   LOS: 20 days   Jeoffrey Massed, MD  [facility]    To contact the attending provider between 7A-7P or the covering provider during after hours 7P-7A, please log into the web site [email or web address] and access using universal [place] password for that web site. If you do not have the password, please call the hospital operator.  05/25/2023, 11:46 AM

## 2023-05-25 NOTE — Progress Notes (Signed)
Called to dialysis report was placed on hold noone came back to the phone.

## 2023-05-25 NOTE — Progress Notes (Signed)
Progress Note     Subjective: Seen in HD. Having some pain from constipation but no pain in RUQ. Not eating much secondary to constipation.   Objective: Vital signs in last 24 hours: Temp:  [98.1 F (36.7 C)-98.3 F (36.8 C)] 98.2 F (36.8 C) (05/28 0400) Pulse Rate:  [69-85] 85 (05/28 1102) Resp:  [14-24] 17 (05/28 1102) BP: (98-131)/(55-73) 117/66 (05/28 1102) SpO2:  [92 %-96 %] 93 % (05/28 1102) Weight:  [57.3 kg-57.8 kg] 57.8 kg (05/28 0820) Last BM Date : 05/23/23  Intake/Output from previous day: 05/27 0701 - 05/28 0700 In: 123 [P.O.:120; I.V.:3] Out: 250 [Drains:250] Intake/Output this shift: No intake/output data recorded.  PE: General: pleasant, WD, chronically ill appearing female who is laying in bed in NAD HEENT: sclera anicteric  Heart: regular, rate, and rhythm.   Lungs:  Respiratory effort nonlabored Abd: soft, NT, ND, drain with bilious material     Lab Results:  Recent Labs    05/23/23 1335 05/25/23 0218  WBC 25.2* 14.1*  HGB 10.8* 10.0*  HCT 31.8* 28.4*  PLT 351 341   BMET Recent Labs    05/24/23 0335 05/25/23 0218  NA 134* 128*  K 4.2 3.6  CL 91* 85*  CO2 20* 20*  GLUCOSE 243* 117*  BUN 79* 95*  CREATININE 7.32* 8.39*  CALCIUM 9.2 8.1*   PT/INR No results for input(s): "LABPROT", "INR" in the last 72 hours. CMP     Component Value Date/Time   NA 128 (L) 05/25/2023 0218   NA 140 04/12/2023 1702   K 3.6 05/25/2023 0218   CL 85 (L) 05/25/2023 0218   CO2 20 (L) 05/25/2023 0218   GLUCOSE 117 (H) 05/25/2023 0218   BUN 95 (H) 05/25/2023 0218   BUN 39 (H) 04/12/2023 1702   CREATININE 8.39 (H) 05/25/2023 0218   CREATININE 1.02 (H) 12/23/2016 0940   CALCIUM 8.1 (L) 05/25/2023 0218   PROT 7.7 05/23/2023 0354   PROT 6.8 04/12/2023 1702   ALBUMIN 2.1 (L) 05/25/2023 0218   ALBUMIN 3.9 04/12/2023 1702   AST 68 (H) 05/23/2023 0354   ALT 24 05/23/2023 0354   ALKPHOS 153 (H) 05/23/2023 0354   BILITOT 1.0 05/23/2023 0354   BILITOT  <0.2 04/12/2023 1702   GFRNONAA 5 (L) 05/25/2023 0218   GFRNONAA 59 (L) 12/23/2016 0940   GFRAA 47 (L) 09/13/2018 1002   GFRAA 68 12/23/2016 0940   Lipase     Component Value Date/Time   LIPASE 46 05/05/2023 0927       Studies/Results: IR Perc Cholecystostomy  Result Date: 05/25/2023 INDICATION: 72 year old female with acute cholecystitis. She has a poor operative candidate and presents for cholecystostomy tube placement. EXAM: CHOLECYSTOSTOMY MEDICATIONS: In patient currently receiving intravenous antibiotics. No additional antibiotic prophylaxis was administered. ANESTHESIA/SEDATION: Moderate (conscious) sedation was employed during this procedure. A total of Versed 1.5 mg and Fentanyl 75 mcg was administered intravenously. Moderate Sedation Time: 21 minutes. The patient's level of consciousness and vital signs were monitored continuously by radiology nursing throughout the procedure under my direct supervision. FLUOROSCOPY TIME:  Radiation exposure index: 4.6 mGy COMPLICATIONS: None immediate. PROCEDURE: Informed written consent was obtained from the patient after a thorough discussion of the procedural risks, benefits and alternatives. All questions were addressed. Maximal Sterile Barrier Technique was utilized including caps, mask, sterile gowns, sterile gloves, sterile drape, hand hygiene and skin antiseptic. A timeout was performed prior to the initiation of the procedure. The right upper quadrant was interrogated with ultrasound. The  massively distended gallbladder was successfully identified. A suitable skin entry site was selected and marked. Local anesthesia was attained by infiltration with 1% lidocaine. A small dermatotomy was made. Under real-time ultrasound guidance, a 21 gauge trocar needle was advanced along a short transhepatic tract and into the gallbladder lumen. A wire was then advanced into the gallbladder lumen. The needle was exchanged and the gall to transitional dilator  was advanced over the wire and into the gallbladder lumen. Aspiration was performed yielding black bile. A small amount of contrast was injected confirming that the device was indeed within the gallbladder lumen. Images were obtained and stored for the medical record. A 0.035 wire was then advanced in the gallbladder lumen. The percutaneous tract was dilated to 10 [place]. A Cook 10 [place] all-purpose drainage catheter was advanced over the wire and formed in the gallbladder lumen. Several 100 mL of viscous black bile were then aspirated. A sample was sent for Gram stain and culture. The catheter was connected to bag drainage and secured to the skin with 0 Prolene suture. Bandages were applied. The patient tolerated the procedure well. IMPRESSION: Successful placement of transhepatic percutaneous cholecystostomy tube for the treatment of acute cholecystitis. Electronically Signed   By: Malachy Moan M.D.   On: 05/25/2023 08:54    Anti-infectives: Anti-infectives (From admission, onward)    Start     Dose/Rate Route Frequency Ordered Stop   05/21/23 1830  piperacillin-tazobactam (ZOSYN) IVPB 2.25 g        2.25 g 100 mL/hr over 30 Minutes Intravenous Every 8 hours 05/21/23 1740     05/17/23 1500  ceFAZolin (ANCEF) IVPB 2g/100 mL premix        2 g 200 mL/hr over 30 Minutes Intravenous To Radiology 05/16/23 1204 05/17/23 1537   05/08/23 1000  ceFAZolin (ANCEF) IVPB 2g/100 mL premix        2 g 200 mL/hr over 30 Minutes Intravenous To Radiology 05/07/23 1537 05/09/23 1000   05/05/23 1515  cefTRIAXone (ROCEPHIN) 1 g in sodium chloride 0.9 % 100 mL IVPB        1 g 200 mL/hr over 30 Minutes Intravenous  Once 05/05/23 1510 05/05/23 1642   05/05/23 1515  azithromycin (ZITHROMAX) 500 mg in sodium chloride 0.9 % 250 mL IVPB        500 mg 250 mL/hr over 60 Minutes Intravenous  Once 05/05/23 1510 05/05/23 1750        Assessment/Plan  Acute cholecystitis  S/P IR percutaneous cholecystostomy 5/26 -  WBC 14 from 25 - Cx with NGTD but in setting of positive HIDA would continue abx for total course 7-10 days, ok to transition to PO augmentin from surgery standpoing  - drain teaching - will need follow up in drain clinic and follow up with Dr. Sheliah Hatch after that to discuss whether she is a candidate for laparoscopic cholecystectomy  - no other recommendations from a surgical standpoint, will not follow acutely but will ensure follow up gets scheduled   FEN: renal/CM diet; added miralax for constipation  VTE: plavix, SQH ID: Zosyn 5/24>>  - per TRH -  AKI on CKD stage IIIb - currently on HD, nephrology following Metabolic encephalopathy, improving Hyponatremia  Acute on chronic HFrEF CAD HTN HLD T2DM Subcentimeter left thyroid nodule    LOS: 20 days   I reviewed Consultant IR and nephrology notes, hospitalist notes, last 24 h vitals and pain scores, last 48 h intake and output, last 24 h labs and trends, and  last 24 h imaging results.    Juliet Rude, [facility] Surgery 05/25/2023, 11:24 AM Please see Amion for pager number during day hours 7:00am-4:30pm

## 2023-05-25 NOTE — Progress Notes (Signed)
Inpatient Rehab Admissions Coordinator:    I spoke with pt.'s daughter regarding potential CIR admit. She is interested and states she plans to stop working to care for her mother when she is discharged from the hospital. Also states Pt. Approved for medicaid. I was able to locate a policy and it is a managed medicaid policy that will require prior auth. I will send case to insurance once PT/OT have seen Pt. And documented (I placed OT eval orders).  Megan Salon, MS, CCC-SLP Rehab Admissions Coordinator  [phone] (celll) [phone] (office)

## 2023-05-25 NOTE — TOC Progression Note (Signed)
Transition of Care [facility]) - Progression Note    Patient Details  Name: Samantha [name] MRN: [id number] Date of Birth: [birth year]  [facility] CM/SW Contact  Mearl Latin, LCSW Phone Number: 05/25/2023, 3:28 PM  Clinical Narrative:    CSW noted Medicaid approval added to chart. CSW added info for [employer] info to AVS.   Expected Discharge Plan: Home/Self Care Barriers to Discharge: Continued Medical Work up  Expected Discharge Plan and Services   Discharge Planning Services: CM Consult   Living arrangements for the past 2 months: Single Family Home                                       Social Determinants of Health (SDOH) Interventions SDOH Screenings   Food Insecurity: No Food Insecurity (05/05/2023)  Housing: Low Risk  (05/05/2023)  Transportation Needs: No Transportation Needs (05/05/2023)  Utilities: Not At Risk (05/05/2023)  Depression (PHQ2-9): Low Risk  (11/19/2021)  Tobacco Use: Low Risk  (05/25/2023)    Readmission Risk Interventions     No data to display

## 2023-05-25 NOTE — Progress Notes (Signed)
[facility] NEPHROLOGY PROGRESS NOTE  Assessment/ Plan: Pt is a 72 y.o. yo female with a past medical history significant for hypertension, type II DM, PAD, CVA, HLD, CKD 3B who presented with shortness of breath and fluid overload.  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  OP HD arranged at [facility] [place] on TTS 11:30 chair time. Plan for PD later on it seems but will need to wait until cholecystitis is better. If not going to pursue PD will need AV access HD today, then continue TTS schedule   # Acute hypoxic respiratory failure due to fluid overload: Received thoracocentesis and diuresis.  Resolved. Now managing volume with HD.  # HTN/volume: BP acceptable.  No edema.  UF as tolerated.  Discontinue Lasix. Only on low dose coreg  # Anemia: Hemoglobin at goal-  no treatment needed as of yet  # Acute cholecystitis: HIDA scan was positive therefore on antibiotics.  Seen by general surgery and IR.  S/p cholecystostomy tube placement.  # Acute metabolic encephalopathy - resolved  Bones-  phos up-  calc WNL-  added renvela.  PTH pending  Subjective: Seen in HD.  No new event.  She is alert awake.  No chest pain or shortness of breath. Family tells me they are trying to get her into inpatient rehab  Objective Vital signs in last 24 hours: Vitals:   05/25/23 0400 05/25/23 0814 05/25/23 0820 05/25/23 0823  BP: 125/69 119/66  131/66  Pulse: 74 71  73  Resp: 17 16  (!) 24  Temp: 98.2 F (36.8 C)     TempSrc: Oral     SpO2: 94% 96%  95%  Weight:   57.8 kg   Height:       Weight change: -1.8 kg  Intake/Output Summary (Last 24 hours) at 05/25/2023 0853 Last data filed at 05/25/2023 0981 Gross per 24 hour  Intake 123 ml  Output 250 ml  Net -127 ml       Labs: RENAL PANEL Recent Labs  Lab 05/21/23 0405 05/22/23 0308 05/22/23 0309  05/23/23 0354 05/24/23 0335 05/25/23 0218  NA 131*  --  131* 133* 134* 128*  K 3.9  --  4.1 3.8 4.2 3.6  CL 93*  --  92* 91* 91* 85*  CO2 24  --  21* 21* 20* 20*  GLUCOSE 156*  --  182* 237* 243* 117*  BUN 31*  --  60* 46* 79* 95*  CREATININE 4.14*  --  5.87* 5.21* 7.32* 8.39*  CALCIUM 9.1  --  9.3 9.1 9.2 8.1*  PHOS 5.3*  --  7.9* 7.0* 8.4* 7.9*  ALBUMIN 3.0* 2.7* 2.8* 2.8* 2.5* 2.1*    Liver Function Tests: Recent Labs  Lab 05/21/23 0405 05/22/23 0308 05/22/23 0309 05/23/23 0354 05/24/23 0335 05/25/23 0218  AST 39 33  --  68*  --   --   ALT 18 16  --  24  --   --   ALKPHOS 91 100  --  153*  --   --   BILITOT 1.1 1.0  --  1.0  --   --   PROT 7.1 7.5  --  7.7  --   --   ALBUMIN 3.0* 2.7*   < > 2.8* 2.5* 2.1*   < > = values in this interval not displayed.   No results for input(s): "  LIPASE", "AMYLASE" in the last 168 hours. Recent Labs  Lab 05/22/23 1621  AMMONIA 15   CBC: Recent Labs    05/21/23 0405 05/22/23 0308 05/23/23 0354 05/23/23 1335 05/25/23 0218  HGB 10.5* 10.7* 11.0* 10.8* 10.0*  MCV 91.2 91.5 94.7 91.6 89.6  FERRITIN  --   --   --   --  632*  TIBC  --   --   --   --  172*  IRON  --   --   --   --  13*    Cardiac Enzymes: No results for input(s): "CKTOTAL", "CKMB", "CKMBINDEX", "TROPONINI" in the last 168 hours. CBG: Recent Labs  Lab 05/23/23 2125 05/24/23 0751 05/24/23 1224 05/24/23 1655 05/24/23 2019  GLUCAP 170* 188* 127* 91 142*    Iron Studies:  Recent Labs    05/25/23 0218  IRON 13*  TIBC 172*  FERRITIN 632*   Studies/Results: No results found.  Medications: Infusions:  sodium chloride Stopped (05/09/23 2241)   piperacillin-tazobactam (ZOSYN)  IV 2.25 g (05/25/23 1610)    Scheduled Medications:  atorvastatin  80 mg Oral Daily   bisacodyl  10 mg Rectal Daily   carvedilol  6.25 mg Oral BID WC   Chlorhexidine Gluconate Cloth  6 each Topical Q0600   clopidogrel  75 mg Oral Daily   diphenhydrAMINE  25 mg Intravenous  Once   docusate sodium  100 mg Oral BID   fluticasone  2 spray Each Nare Daily   guaiFENesin  600 mg Oral BID   heparin injection (subcutaneous)  5,000 Units Subcutaneous Q8H   insulin aspart  0-15 Units Subcutaneous TID WC   insulin aspart  0-5 Units Subcutaneous QHS   insulin aspart  3 Units Subcutaneous TID WC   insulin glargine-yfgn  15 Units Subcutaneous Daily   oxymetazoline  1 spray Each Nare Once   sevelamer carbonate  1,600 mg Oral TID WC   sodium chloride flush  3 mL Intravenous Q12H    have reviewed scheduled and prn medications.  Physical Exam: General:NAD, comfortable Heart:RRR, s1s2 nl Lungs:clear b/l, no crackle Abdomen:soft, mild tenderness, not distended. Extremities:No edema Dialysis Access: TDC  Samantha [name] 05/25/2023,8:53 AM  LOS: 20 days

## 2023-05-25 NOTE — Plan of Care (Signed)

## 2023-05-25 NOTE — Progress Notes (Signed)
Physical Therapy Treatment Patient Details Name: Samantha [name] MRN: [id number] DOB: 06-May-1951 Today's Date: 05/25/2023   History of Present Illness 72 y.o. female who presented on 5/8 with acute hypoxic respiratory failure in the setting of pulm edema with worsening renal function. CT of chest found new bilateral pleural effusions, right> left parenchymal opacities. 5/11 thoracocentesis. PMHx: HTN, HLD, PAD, DM2, CVA, depression/anxiety.    PT Comments    Pt greeted resting in bed and agreeable to session with continued progress towards acute goals. Pt able to complete bed mobility with up to mod A to manage Les and elevate trunk. Pt needing mod A to come to stand to RW and step pivot to [facility] with pt attempting to sit prematurely, needing max cues for safety awareness. Pt able to stand from [facility] to RW with min A and ambulate in room short distance from [facility] to [facility] with min A to steady and manage RW. Continued education on importance of time up OOB, nutrition and importance of continued mobility with pt verbalizing understanding. Pt daughter present and supportive throughout session. Current plan remains appropriate to address deficits and maximize functional independence and decrease caregiver burden. Pt continues to benefit from skilled PT services to progress toward functional mobility goals.     Recommendations for follow up therapy are one component of a multi-disciplinary discharge planning process, led by the attending physician.  Recommendations may be updated based on patient status, additional functional criteria and insurance authorization.  Follow Up Recommendations       Assistance Recommended at Discharge Frequent or constant Supervision/Assistance  Patient can return home with the following A lot of help with walking and/or transfers;A little help with bathing/dressing/bathroom;Assistance with cooking/housework;Help with stairs or ramp for entrance;Assist for transportation    Equipment Recommendations  Other (comment) (TBD)    Recommendations for Other Services       Precautions / Restrictions Precautions Precautions: Fall Precaution Comments: denies falls in past 6 months Restrictions Weight Bearing Restrictions: No     Mobility  Bed Mobility Overal bed mobility: Needs Assistance Bed Mobility: Supine to Sit, Sit to Supine     Supine to sit: Mod assist, HOB elevated Sit to supine: Min assist   General bed mobility comments: assist for trunk support into sitting and BLE management for return to supine    Transfers Overall transfer level: Needs assistance Equipment used: Rolling walker (2 wheels) Transfers: Sit to/from Stand, Bed to chair/wheelchair/BSC Sit to Stand: Min assist, Mod assist   Step pivot transfers: Mod assist       General transfer comment: modA to power up and steady, initial posterior lean. improving with subsequent stands, mod A to step pivot to [facility] with pt attempting to sit prematurely    Ambulation/Gait Ambulation/Gait assistance: Min assist Gait Distance (Feet): 3 Feet Assistive device: Rolling walker (2 wheels) Gait Pattern/deviations: Step-through pattern, Step-to pattern       General Gait Details: short gait from [facility] to head of bed with min A to steady and manage RW, mild knee flexion in stance phases   Stairs             Wheelchair Mobility    Modified Rankin (Stroke Patients Only)       Balance Overall balance assessment: Needs assistance Sitting-balance support: Bilateral upper extremity supported, Feet supported Sitting balance-Leahy Scale: Fair     Standing balance support: Bilateral upper extremity supported Standing balance-Leahy Scale: Poor Standing balance comment: requiring physical assist and BUE support during dynamic tasks  Cognition Arousal/Alertness: Awake/alert Behavior During Therapy: Flat affect Overall Cognitive Status: Within  Functional Limits for tasks assessed                                 General Comments: limited by pain throughout session and requires increased time and motivation but able to follow commands through daughter's interpretation        Exercises      General Comments General comments (skin integrity, edema, etc.): VSS on RA      Pertinent Vitals/Pain Pain Assessment Pain Assessment: Faces Faces Pain Scale: Hurts even more Pain Location: RUQ Pain Descriptors / Indicators: Grimacing, Guarding Pain Intervention(s): Monitored during session, Limited activity within patient's tolerance    Home Living                          Prior Function            PT Goals (current goals can now be found in the care plan section) Acute Rehab PT Goals Patient Stated Goal: to get up to bathroom PT Goal Formulation: With patient/family Time For Goal Achievement: 06/04/23 Progress towards PT goals: Progressing toward goals    Frequency    Min 3X/week      PT Plan      Co-evaluation              AM-PAC PT "6 Clicks" Mobility   Outcome Measure  Help needed turning from your back to your side while in a flat bed without using bedrails?: A Little Help needed moving from lying on your back to sitting on the side of a flat bed without using bedrails?: A Lot Help needed moving to and from a bed to a chair (including a wheelchair)?: A Lot Help needed standing up from a chair using your arms (e.g., wheelchair or bedside chair)?: A Lot Help needed to walk in hospital room?: A Little Help needed climbing 3-5 steps with a railing? : Total 6 Click Score: 13    End of Session   Activity Tolerance: Patient limited by pain;Patient limited by fatigue Patient left: in bed;with call bell/phone within reach;with bed alarm set;with family/visitor present Nurse Communication: Mobility status PT Visit Diagnosis: Muscle weakness (generalized) (M62.81);Difficulty in  walking, not elsewhere classified (R26.2);Other abnormalities of gait and mobility (R26.89);Pain Pain - Right/Left: Right Pain - part of body:  (RUQ)     Time: 1446-1510 PT Time Calculation (min) (ACUTE ONLY): 24 min  Charges:  $Gait Training: 8-22 mins $Therapeutic Activity: 8-22 mins                     Marcella Dunnaway R. PTA Acute Rehabilitation Services Office: [phone]   Catalina Antigua 05/25/2023, 4:00 PM

## 2023-05-26 DIAGNOSIS — J9601 Acute respiratory failure with hypoxia: Secondary | ICD-10-CM | POA: Diagnosis not present

## 2023-05-26 DIAGNOSIS — Z91148 Patient's other noncompliance with medication regimen for other reason: Secondary | ICD-10-CM

## 2023-05-26 DIAGNOSIS — I10 Essential (primary) hypertension: Secondary | ICD-10-CM | POA: Diagnosis not present

## 2023-05-26 DIAGNOSIS — N179 Acute kidney failure, unspecified: Secondary | ICD-10-CM | POA: Diagnosis not present

## 2023-05-26 LAB — GLUCOSE, CAPILLARY
Glucose-Capillary: 148 mg/dL — ABNORMAL HIGH (ref 70–99)
Glucose-Capillary: 193 mg/dL — ABNORMAL HIGH (ref 70–99)
Glucose-Capillary: 105 mg/dL — ABNORMAL HIGH (ref 70–99)
Glucose-Capillary: 156 mg/dL — ABNORMAL HIGH (ref 70–99)

## 2023-05-26 MED ORDER — SODIUM CHLORIDE 0.9 % IV SOLN
250.0000 mg | Freq: Every day | INTRAVENOUS | Status: DC
  Administered 2023-05-26 – 2023-05-28 (×3): 250 mg via INTRAVENOUS
  Filled 2023-05-26: qty 250
  Filled 2023-05-26 (×2): qty 20

## 2023-05-26 MED ORDER — CHLORHEXIDINE GLUCONATE CLOTH 2 % EX PADS
6.0000 | MEDICATED_PAD | Freq: Every day | CUTANEOUS | Status: DC
  Administered 2023-05-27 – 2023-05-28 (×2): 6 via TOPICAL

## 2023-05-26 NOTE — TOC Progression Note (Signed)
Transition of Care [facility] - Progression Note    Patient Details  Name: Samantha [name] MRN: [id number] Date of Birth: 02-16-1951  Transition of Care [facility]) CM/SW Contact  Mearl Latin, LCSW Phone Number: 05/26/2023, 2:30 PM  Clinical Narrative:    CSW received consult to contact patient's daughter. CSW spoke with Samantha [name]. She requested a letter that they could provide to the [place] to request their sister Samantha [name]) come to the [place] to help with the patient. CSW requested she find out her sister's address, DOB, and phone number and call CSW back.    Expected Discharge Plan: IP Rehab Facility Barriers to Discharge: English as a second language teacher (HD [place])  Expected Discharge Plan and Services   Discharge Planning Services: CM Consult   Living arrangements for the past 2 months: Single Family Home                                       Social Determinants of Health (SDOH) Interventions SDOH Screenings   Food Insecurity: No Food Insecurity (05/05/2023)  Housing: Low Risk  (05/05/2023)  Transportation Needs: No Transportation Needs (05/05/2023)  Utilities: Not At Risk (05/05/2023)  Depression (PHQ2-9): Low Risk  (11/19/2021)  Tobacco Use: Low Risk  (05/25/2023)    Readmission Risk Interventions     No data to display

## 2023-05-26 NOTE — Consult Note (Signed)
Physical Medicine and Rehabilitation Consult Reason for Consult:Debility Referring Physician: Jeoffrey Massed, MD   HPI: Samantha [name] is a 72 y.o. female with a PMH of HTN, type 2 DM, PAD. CVA, HLD, and stage 3B CKD who presented with shortness of breath and fluid overload. 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    ROS encephalopathy improved Past Medical History:  Diagnosis Date   Acute pyelonephritis    Bacteremia, escherichia coli 01/12/2015   Chest pain    Critical lower limb ischemia (HCC)    Hyperlipidemia    Hypertension    Left carotid bruit    PAD (peripheral artery disease) (HCC)    Type II diabetes mellitus (HCC)    Past Surgical History:  Procedure Laterality Date   ABDOMINAL ANGIOGRAM  05/16/2015   Procedure: Abdominal Angiogram;  Surgeon: Runell Gess, MD;  Location: [facility] INVASIVE CV LAB;  Service: Cardiovascular;;   ABDOMINAL HYSTERECTOMY  ~ 2000   IR FLUORO GUIDE CV LINE RIGHT  05/17/2023   IR PERC CHOLECYSTOSTOMY  05/23/2023   IR US GUIDE VASC ACCESS RIGHT  05/17/2023   PERIPHERAL VASCULAR CATHETERIZATION Bilateral 05/16/2015   Procedure: Lower Extremity Angiography;  Surgeon: Runell Gess, MD; renal arteries widely patent, L-SFA 75%, L-pop 95%, L-peroneal 90%; R-SFA 40%, R-pop 50%, R-prox peroneal 90%; directional atherectomy L-SFA, p-pop, drug-eluting balloon angioplasty reducing the stenoses to 0, small linear dissection was not flow limiting       PERIPHERAL VASCULAR CATHETERIZATION Left 05/16/2015   Procedure: Peripheral Vascular Atherectomy;  Surgeon: Runell Gess, MD;  Location: [facility] INVASIVE CV LAB;  Service: Cardiovascular;  Laterality: Left;  sfa   Family History  Problem Relation Age of Onset   Hypertension Mother    Social History:  reports that she has never smoked. She has never used smokeless tobacco. She reports current alcohol use of about 1.0 - 2.0  standard drink of alcohol per week. She reports that she does not use drugs. Allergies:  Allergies  Allergen Reactions   Chicken Allergy Other (See Comments)    headache   Perflutren Other (See Comments)    Pt with back pain as soon as administered.- patient refuted this in 2024   Medications Prior to Admission  Medication Sig Dispense Refill   amLODipine (NORVASC) 10 MG tablet Take 1 tablet (10 mg total) by mouth daily. 90 tablet 1   atorvastatin (LIPITOR) 80 MG tablet Take 1 tablet (80 mg total) by mouth daily. 90 tablet 1   cephALEXin (KEFLEX) 500 MG capsule Take 500 mg by mouth 2 (two) times daily.     clopidogrel (PLAVIX) 75 MG tablet Take 1 tablet (75 mg total) by mouth daily. 90 tablet 1   diltiazem (CARDIZEM SR) 120 MG 12 hr capsule Take 120 mg by mouth daily.     furosemide (LASIX) 40 MG tablet Take 1 tablet (40 mg total) by mouth daily for 7 days. 7 tablet 0   glimepiride (AMARYL) 4 MG tablet Take 1 tablet (4 mg total) by mouth daily with breakfast. 90 tablet 1   hydrALAZINE (APRESOLINE) 50 MG tablet Take 1 tablet (50 mg total) by mouth every 8 (eight) hours. 90 tablet 1   sodium bicarbonate 650 MG tablet Take 1 tablet (650 mg total) by mouth 3 (three) times daily. 90 tablet 0   glucose blood test strip Use 3 times daily before meals 100 each  12   insulin isophane & regular human KwikPen (HUMULIN 70/30 KWIKPEN) (70-30) 100 UNIT/ML KwikPen Inject 30 Units into the skin in the morning AND 25 Units every evening. (Patient taking differently: Inject 30 Units into the skin in the morning AND 26 Units every evening.) 90 mL 6   Insulin Syringe-Needle U-100 (BD INSULIN SYRINGE ULTRAFINE) 31G X 15/64" 0.5 ML MISC Use subcutaneously twice daily 60 each 12   TRUEPLUS LANCETS 28G MISC 1 each by Does not apply route 3 (three) times daily before meals. 100 each 12    Home: Home Living Family/patient expects to be discharged to:: Private residence Living Arrangements: Children Available Help  at Discharge: Family, Available 24 hours/day Type of Home: House Home Access: Stairs to enter Secretary/administrator of Steps: 3 Entrance Stairs-Rails: Left Home Layout: One level Bathroom Shower/Tub: Engineer, manufacturing systems: Standard Home Equipment: Agricultural consultant (2 wheels), BSC/3in1 Additional Comments: lives with daughter  Functional History: Prior Function Prior Level of Function : Independent/Modified Independent Mobility Comments: independent without AD, no falls ADLs Comments: independent Functional Status:  Mobility: Bed Mobility Overal bed mobility: Needs Assistance Bed Mobility: Supine to Sit, Sit to Supine Supine to sit: Min assist, HOB elevated Sit to supine: Min assist General bed mobility comments: assist for trunk support into sitting and BLE management for return to supine Transfers Overall transfer level: Needs assistance Equipment used: Rolling walker (2 wheels) Transfers: Sit to/from Stand, Bed to chair/wheelchair/BSC Sit to Stand: Min assist, Mod assist Bed to/from chair/wheelchair/BSC transfer type:: Step pivot Step pivot transfers: Mod assist General transfer comment: modA to power up and steady, initial posterior lean. improving with subsequent stands, mod A to step pivot to [facility] with pt attempting to sit prematurely Ambulation/Gait Ambulation/Gait assistance: Min assist Gait Distance (Feet): 3 Feet Assistive device: Rolling walker (2 wheels) Gait Pattern/deviations: Step-through pattern, Step-to pattern General Gait Details: short gait from [facility] to head of bed with min A to steady and manage RW, mild knee flexion in stance phases    ADL: ADL Overall ADL's : Needs assistance/impaired Eating/Feeding: Modified independent Grooming: Set up, Min guard, Sitting (Decreased sitting balance/tolerance) Upper Body Bathing: Minimal assistance, Moderate assistance, Sitting (with extra time; decreased sitting balance/tolerance) Lower Body Bathing:  Moderate assistance, Cueing for compensatory techniques, Sit to/from stand (decreased standing and sitting balance/tolerance) Upper Body Dressing : Min guard, Sitting (decreased sitting balance/tolerance) Lower Body Dressing: Minimal assistance, Moderate assistance, Cueing for compensatory techniques, Sit to/from stand (Min to Mod assist to maintain balance during stand) Toilet Transfer: Minimal assistance, Moderate assistance, BSC/3in1 (step pivot) Toileting- Clothing Manipulation and Hygiene: Maximal assistance, Sit to/from stand, Minimal assistance (Min assist follwoing urination, Max assist following BM) General ADL Comments: Pt declined funcitonal mobility attempt this session.  Cognition: Cognition Overall Cognitive Status: Within Functional Limits for tasks assessed Orientation Level: Oriented X4 Cognition Arousal/Alertness: Awake/alert Behavior During Therapy: Flat affect Overall Cognitive Status: Within Functional Limits for tasks assessed General Comments: limited by pain throughout session and requires increased time and motivation but able to follow commands through daughter's interpretation  Blood pressure 100/62, pulse 73, temperature 97.7 F (36.5 C), temperature source Oral, resp. rate 13, height 5\' 5"  (1.651 m), weight 62.4 kg, SpO2 96 %. Physical Exam Gen: no distress, normal appearing HEENT: oral mucosa pink and moist, NCAT Cardio: Reg rate Chest: normal effort, normal rate of breathing Abd: soft, non-distended Ext: no edema Psych: pleasant, normal affect Skin: intact Neuro: Alert and oriented x3 Musculoskeletal: No focal deficits  Results for  orders placed or performed during the hospital encounter of 05/05/23 (from the past 24 hour(s))  Glucose, capillary     Status: Abnormal   Collection Time: 05/25/23  3:56 PM  Result Value Ref Range   Glucose-Capillary 172 (H) 70 - 99 mg/dL  Glucose, capillary     Status: None   Collection Time: 05/25/23  8:31 PM  Result  Value Ref Range   Glucose-Capillary 89 70 - 99 mg/dL  Glucose, capillary     Status: Abnormal   Collection Time: 05/26/23  8:12 AM  Result Value Ref Range   Glucose-Capillary 148 (H) 70 - 99 mg/dL  Glucose, capillary     Status: Abnormal   Collection Time: 05/26/23 11:46 AM  Result Value Ref Range   Glucose-Capillary 193 (H) 70 - 99 mg/dL   No results found.  Assessment/Plan: Diagnosis: Debility Does the need for close, 24 hr/day medical supervision in concert with the patient's rehab needs make it unreasonable for this patient to be served in a less intensive setting? Yes Co-Morbidities requiring supervision/potential complications:   1) Acute hypoxemic respiratory failure: monitor O2 sat TID  2) ESRD: continue TTS dialysis  3) HTN: continue coreg  4) Anemia  5) Acute cholecystitis Due to bladder management, bowel management, safety, skin/wound care, disease management, medication administration, pain management, and patient education, does the patient require 24 hr/day rehab nursing? Yes Does the patient require coordinated care of a physician, rehab nurse, therapy disciplines of PT, OT to address physical and functional deficits in the context of the above medical diagnosis(es)? Yes Addressing deficits in the following areas: balance, endurance, locomotion, strength, transferring, bowel/bladder control, bathing, dressing, feeding, grooming, toileting, and psychosocial support Can the patient actively participate in an intensive therapy program of at least 3 hrs of therapy per day at least 5 days per week? Yes The potential for patient to make measurable gains while on inpatient rehab is excellent Anticipated functional outcomes upon discharge from inpatient rehab are modified independent  with PT, modified independent with OT, independent with SLP. Estimated rehab length of stay to reach the above functional goals is: 10-14 days Anticipated discharge destination: Home Overall  Rehab/Functional Prognosis: excellent  POST ACUTE RECOMMENDATIONS: This patient's condition is appropriate for continued rehabilitative care in the following setting: CIR Patient has agreed to participate in recommended program. Yes Note that insurance prior authorization may be required for reimbursement for recommended care.    I have personally performed a face to face diagnostic evaluation of this patient. Additionally, I have examined the patient's medical record including any pertinent labs and radiographic images. If the physician assistant has documented in this note, I have reviewed and edited or otherwise concur with the physician assistant's documentation.  Thanks,  Horton Chin, MD 05/26/2023

## 2023-05-26 NOTE — PMR Pre-admission (Signed)
PMR Admission Coordinator Pre-Admission Assessment  Patient: Samantha [name] is an 72 y.o., female MRN: [id number] DOB: 1951/06/06 Height: 5\' 5"  (165.1 cm) Weight: 60 kg  Insurance Information HMO:     PPO:      PCP:      IPA:      80/20:      OTHER:  PRIMARY:  [employer] [facility]     Policy#: [id number], Medicaid #: [id number]      Subscriber: patient CM Name:                                        P: [phone]                      F: [phone] Pre-Cert#: [id number]  approved from 05/28/23 to 06/03/23     Employer: Not employed Benefits:  Phone #: [phone]    Name: checked on line portal Eff. Date:  Effective: 04/28/2023 to 03/27/2024     Deduct:   0    Out of Pocket Max: 0       Life Max: 0 CIR: 100%      SNF: 100% Outpatient: 100%     Co-Pay: 0 Home Health: 100%      Co-Pay: 0 DME: 100%     Co-Pay: 0 Providers: in network   SECONDARY:       Policy#:      Phone#:   Artist:       Phone#:   The Data processing manager" for patients in Inpatient Rehabilitation Facilities with attached "Privacy Act Statement-Health Care Records" was provided and verbally reviewed with: n/a   Emergency Contact Information Contact Information     Name Relation Home Work Mobile   [place] Daughter [phone]  [phone]   [name] Daughter   [phone]       Current Medical History  Patient Admitting Diagnosis: Respiratory Failure, Renal Failure, L cerebellar CVA   History of Present Illness: Samantha [name]  is a 72 y.o. female, with medical history of PAD, hypertension, diabetes mellitus type 2, insulin-dependent, history of CVA, depression, anxiety who was just discharged from the hospital on 4/21 after she was treated for UTI/pyelonephritis, pulmonary edema.  Patient was discharged on Lasix 40 mg p.o. daily for 7 days. On 05/04/27, Pt. developed worsening abdominal discomfort and also developed chest discomfort and returned to  the [facility] Department on 05/05/23. In the ED CT chest abdomen/pelvis was done which showed pulmonary edema versus bibasilar infiltrates.  BNP was elevated to 600.  Initially patient received IV fluid bolus LR 1 L.  Oxygen requirement went up to 4.5 L/min.Lab work also showed mild elevation of troponin 49, 51, creatinine 2.34, improved from previous creatinine from 4/21 which was 2.83. She was admitted with Acute hypoxemic respiratory failure-insetting of pulmonary edema, bilateral pleural effusions.  On 05/05/24 Echo showed EF of 55-60%.  CT chest on 05/09/23 showed increasing interstitial/patchy groundglass opacities-decreasing small bilateral pleural effusions.  On 05/22/23 MRA of the brain showed  Intracranial atherosclerosis including severe left M2/right A2 stenosis, moderate left V4, basilar and PCA stenosis and MRI showed punctate acute left cerebellar infarct. LTM EEG: No seizures. During admission, pt. Progressed to ESRD and pt. Was placed on HD (tunneled catheter placed by IR 05/17/23). She has been clipped for outpatient HD, on TTS schedule. Had low-grade fever on 5/22 and subsequently worsening  leukocytosis/RUQ pain on 5/24-HIDA scan positive-evaluated by general surgery-Plavix held-underwent cholecystostomy tube placement by IR on 5/26. Pt. Was initially on Zosyn-should be able to transition to Augmentin with plan on total of 7 days of treatment.  Pt. Seen by PT/OT who feel Pt. Would benefit from CIR to assist return to PLOF.   Complete NIHSS TOTAL: 10  Patient's medical record from [facility] has been reviewed by the rehabilitation admission coordinator and physician.  Past Medical History  Past Medical History:  Diagnosis Date   Acute pyelonephritis    Bacteremia, escherichia coli 01/12/2015   Chest pain    Critical lower limb ischemia (HCC)    Hyperlipidemia    Hypertension    Left carotid bruit    PAD (peripheral artery disease) (HCC)    Type II diabetes  mellitus (HCC)     Has the patient had major surgery during 100 days prior to admission? Yes  Family History   family history includes Hypertension in her mother.  Current Medications  Current Facility-Administered Medications:    0.9 %  sodium chloride infusion, 250 mL, Intravenous, PRN, Meredeth Ide, MD, Stopped at 05/09/23 2241   acetaminophen (TYLENOL) tablet 650 mg, 650 mg, Oral, Q6H PRN, Maretta Bees, MD, 650 mg at 05/27/23 1246   albuterol (PROVENTIL) (2.5 MG/3ML) 0.083% nebulizer solution 2.5 mg, 2.5 mg, Nebulization, Q4H PRN, Sharl Ma, Sarina Ill, MD   amoxicillin-clavulanate (AUGMENTIN) 500-125 MG per tablet 1 tablet, 1 tablet, Oral, BID, Pham, Minh Q, RPH-CPP, 1 tablet at 05/28/23 0847   atorvastatin (LIPITOR) tablet 80 mg, 80 mg, Oral, Daily, Luciano Cutter, MD, 80 mg at 05/28/23 0912   bisacodyl (DULCOLAX) suppository 10 mg, 10 mg, Rectal, Daily, Pokhrel, Laxman, MD, 10 mg at 05/24/23 0825   carvedilol (COREG) tablet 6.25 mg, 6.25 mg, Oral, BID WC, Elgergawy, Leana Roe, MD, 6.25 mg at 05/28/23 0846   Chlorhexidine Gluconate Cloth 2 % PADS 6 each, 6 each, Topical, Q0600, Annie Sable, MD, 6 each at 05/28/23 0537   Chlorhexidine Gluconate Cloth 2 % PADS 6 each, 6 each, Topical, Q0600, Annie Sable, MD, 6 each at 05/28/23 0537   Chlorhexidine Gluconate Cloth 2 % PADS 6 each, 6 each, Topical, Q0600, Annie Sable, MD   clopidogrel (PLAVIX) tablet 75 mg, 75 mg, Oral, Daily, Ghimire, Werner Lean, MD, 75 mg at 05/28/23 1610   diphenhydrAMINE (BENADRYL) injection 25 mg, 25 mg, Intravenous, Once, [place], Sarina Ill, MD   docusate sodium (COLACE) capsule 100 mg, 100 mg, Oral, BID, Pokhrel, Laxman, MD, 100 mg at 05/28/23 0913   ferric gluconate (FERRLECIT) 250 mg in sodium chloride 0.9 % 250 mL IVPB, 250 mg, Intravenous, Daily, Annie Sable, MD, Last Rate: 135 mL/hr at 05/28/23 0927, 250 mg at 05/28/23 0927   fluticasone (FLONASE) 50 MCG/ACT nasal spray 2 spray, 2  spray, Each Nare, Daily, Meredeth Ide, MD, 2 spray at 05/24/23 0826   guaiFENesin (MUCINEX) 12 hr tablet 600 mg, 600 mg, Oral, BID, Sharl Ma, Gagan S, MD, 600 mg at 05/28/23 0912   heparin injection 5,000 Units, 5,000 Units, Subcutaneous, Q8H, [place]he[place], Kaleigh, RPH, 5,000 Units at 05/28/23 0536   HYDROmorphone (DILAUDID) injection 0.5 mg, 0.5 mg, Intravenous, Q4H PRN, Ghimire, Shanker M, MD, 0.5 mg at 05/23/23 1741   insulin aspart (novoLOG) injection 0-15 Units, 0-15 Units, Subcutaneous, TID WC, Pokhrel, Laxman, MD, 2 Units at 05/28/23 0847   insulin aspart (novoLOG) injection 0-5 Units, 0-5 Units, Subcutaneous, QHS, Pokhrel, Laxman, MD, 3 Units at  05/17/23 2044   insulin aspart (novoLOG) injection 3 Units, 3 Units, Subcutaneous, TID WC, Elgergawy, Leana Roe, MD, 3 Units at 05/28/23 0847   insulin glargine-yfgn (SEMGLEE) injection 15 Units, 15 Units, Subcutaneous, Daily, Elgergawy, Leana Roe, MD, 15 Units at 05/28/23 0920   iohexol (OMNIPAQUE) 300 MG/ML solution 50 mL, 50 mL, Per Tube, Once PRN, Sterling Big, MD   LORazepam (ATIVAN) tablet 0.5 mg, 0.5 mg, Oral, Q6H PRN, Howerter, Justin B, DO   melatonin tablet 3 mg, 3 mg, Oral, QHS PRN, Howerter, Justin B, DO   ondansetron (ZOFRAN) tablet 4 mg, 4 mg, Oral, Q6H PRN, 4 mg at 05/18/23 0854 **OR** ondansetron (ZOFRAN) injection 4 mg, 4 mg, Intravenous, Q6H PRN, Sharl Ma, Sarina Ill, MD, 4 mg at 05/27/23 1433   Oral care mouth rinse, 15 mL, Mouth Rinse, PRN, Elgergawy, Leana Roe, MD   oxyCODONE (Oxy IR/ROXICODONE) immediate release tablet 5 mg, 5 mg, Oral, Q4H PRN, Pokhrel, Laxman, MD, 5 mg at 05/25/23 1352   oxymetazoline (AFRIN) 0.05 % nasal spray 1 spray, 1 spray, Each Nare, Once, [place], Sarina Ill, MD   polyethylene glycol (MIRALAX / GLYCOLAX) packet 17 g, 17 g, Oral, BID, Juliet Rude, PA-C, 17 g at 05/28/23 1610   sevelamer carbonate (RENVELA) tablet 1,600 mg, 1,600 mg, Oral, TID WC, Annie Sable, MD, 1,600 mg at 05/28/23 0847   sodium  chloride (OCEAN) 0.65 % nasal spray 1 spray, 1 spray, Each Nare, PRN, Sharl Ma, Sarina Ill, MD   sodium chloride flush (NS) 0.9 % injection 3 mL, 3 mL, Intravenous, Q12H, Sharl Ma, Gagan S, MD, 3 mL at 05/28/23 0912   sodium chloride flush (NS) 0.9 % injection 3 mL, 3 mL, Intravenous, PRN, Sharl Ma, Sarina Ill, MD  Patients Current Diet:  Diet Order             Diet - low sodium heart healthy           Diet Carb Modified           Diet renal/carb modified with fluid restriction Diet-HS Snack? Nothing; Fluid restriction: 1200 mL Fluid; Room service appropriate? Yes; Fluid consistency: Thin  Diet effective now                   Precautions / Restrictions Precautions Precautions: Fall Precaution Comments: denies falls in past 6 months Restrictions Weight Bearing Restrictions: No   Has the patient had 2 or more falls or a fall with injury in the past year? No  Prior Activity Level Community (5-7x/wk): Pt. active in the community PTA  Prior Functional Level Self Care: Did the patient need help bathing, dressing, using the toilet or eating? Independent  Indoor Mobility: Did the patient need assistance with walking from room to room (with or without device)? Independent  Stairs: Did the patient need assistance with internal or external stairs (with or without device)? Independent  Functional Cognition: Did the patient need help planning regular tasks such as shopping or remembering to take medications? Independent  Patient Information Are you of Hispanic, Latino/a,or Spanish origin?: B. Yes, [place] American, Chicano/a What is your race?: Z. None of the above Do you need or want an interpreter to communicate with a doctor or health care staff?: 0. Yes  Patient's Response To:  Health Literacy and Transportation Is the patient able to respond to health literacy and transportation needs?: Yes Health Literacy - How often do you need to have someone help you when you read instructions,  pamphlets, or other  written material from your doctor or pharmacy?: Sometimes In the past 12 months, has lack of transportation kept you from medical appointments or from getting medications?: No In the past 12 months, has lack of transportation kept you from meetings, work, or from getting things needed for daily living?: No  Journalist, newspaper / Equipment Home Assistive Devices/Equipment: None Home Equipment: Agricultural consultant (2 wheels), BSC/3in1  Prior Device Use: Indicate devices/aids used by the patient prior to current illness, exacerbation or injury? None of the above  Current Functional Level Cognition  Overall Cognitive Status: No family/caregiver present to determine baseline cognitive functioning Orientation Level: Oriented X4 General Comments: pt requiring encouragement and education on participation.    Extremity Assessment (includes Sensation/Coordination)  Upper Extremity Assessment: Generalized weakness  Lower Extremity Assessment: Defer to PT evaluation    ADLs  Overall ADL's : Needs assistance/impaired Eating/Feeding: Modified independent Grooming: Set up, Min guard, Sitting (Decreased sitting balance/tolerance) Upper Body Bathing: Minimal assistance, Moderate assistance, Sitting (with extra time; decreased sitting balance/tolerance) Lower Body Bathing: Moderate assistance, Cueing for compensatory techniques, Sit to/from stand (decreased standing and sitting balance/tolerance) Upper Body Dressing : Min guard, Sitting (decreased sitting balance/tolerance) Lower Body Dressing: Minimal assistance, Moderate assistance, Cueing for compensatory techniques, Sit to/from stand (Min to Mod assist to maintain balance during stand) Toilet Transfer: Minimal assistance, Moderate assistance, BSC/3in1 (step pivot) Toileting- Clothing Manipulation and Hygiene: Maximal assistance, Sit to/from stand, Minimal assistance (Min assist follwoing urination, Max assist following BM) General  ADL Comments: Pt declined funcitonal mobility attempt this session.    Mobility  Overal bed mobility: Needs Assistance Bed Mobility: Supine to Sit, Sit to Supine Supine to sit: Min assist, HOB elevated Sit to supine: Min assist General bed mobility comments: pt long sitting in bed upon arrival, attempted pivoting to EOB and further mobility but pt declining, assisted in posterior scooting back into bed and with pillow placement for back pain, HOB elevated to allow upright posture in bed    Transfers  Overall transfer level: Needs assistance Equipment used: Rolling walker (2 wheels) Transfers: Sit to/from Stand, Bed to chair/wheelchair/BSC Sit to Stand: Min assist, Mod assist Bed to/from chair/wheelchair/BSC transfer type:: Step pivot Step pivot transfers: Mod assist General transfer comment: pt declining all attempts at OOB mobility today due to fatigue, pain, and nausea    Ambulation / Gait / Stairs / Wheelchair Mobility  Ambulation/Gait Ambulation/Gait assistance: Editor, commissioning (Feet): 3 Feet Assistive device: Rolling walker (2 wheels) Gait Pattern/deviations: Step-through pattern, Step-to pattern General Gait Details: pt declining    Posture / Balance Balance Overall balance assessment: Needs assistance Sitting-balance support: Single extremity supported, Bilateral upper extremity supported, Feet supported Sitting balance-Leahy Scale: Fair Postural control: Posterior lean Standing balance support: Bilateral upper extremity supported, During functional activity, Reliant on assistive device for balance Standing balance-Leahy Scale: Poor Standing balance comment: requiring physical assist and BUE support during dynamic tasks    Special needs/care consideration Dialysis: Hemodialysis Tuesday, Thursday, and Saturday   Previous Home Environment (from acute therapy documentation) Living Arrangements: Children Available Help at Discharge: Family, Available 24  hours/day Type of Home: House Home Layout: One level Home Access: Stairs to enter Entrance Stairs-Rails: Left Entrance Stairs-Number of Steps: 3 Bathroom Shower/Tub: Engineer, manufacturing systems: Standard Home Care Services: No Additional Comments: lives with daughter  Discharge Living Setting Plans for Discharge Living Setting: Patient's home Type of Home at Discharge: House Discharge Home Layout: One level Discharge Home Access: Stairs to enter Entrance Stairs-Rails: Left  Entrance Stairs-Number of Steps: 3 Discharge Bathroom Shower/Tub: Tub/shower unit Discharge Bathroom Toilet: Standard Discharge Bathroom Accessibility: No Does the patient have any problems obtaining your medications?: No  Social/Family/Support Systems Patient Roles: Other (Comment) Contact Information: [name] (daughter) Anticipated Caregiver: [phone] Anticipated Caregiver's Contact Information: Daughter works but will leave her job if her mother needs 24/7 care. Can provide min A Ability/Limitations of Caregiver: 24/7 if needed, hopeful to d/c mod I Caregiver Availability: 24/7 Discharge Plan Discussed with Primary Caregiver: Yes Is Caregiver In Agreement with Plan?: Yes Does Caregiver/Family have Issues with Lodging/Transportation while Pt is in Rehab?: No  Goals Patient/Family Goal for Rehab: PT/OT Mod I Expected length of stay: 7-10 days Pt/Family Agrees to Admission and willing to participate: Yes Program Orientation Provided & Reviewed with Pt/Caregiver Including Roles  & Responsibilities: Yes  Decrease burden of Care through IP rehab admission: not anticipated  Possible need for SNF placement upon discharge: not anticipated  Patient Condition: I have reviewed medical records from [facility] , spoken with CM, and patient and daughter. I met with patient at the bedside for inpatient rehabilitation assessment.  Patient will benefit from ongoing PT and OT, can  actively participate in 3 hours of therapy a day 5 days of the week, and can make measurable gains during the admission.  Patient will also benefit from the coordinated team approach during an Inpatient Acute Rehabilitation admission.  The patient will receive intensive therapy as well as Rehabilitation physician, nursing, social worker, and care management interventions.  Due to bladder management, bowel management, safety, skin/wound care, disease management, medication administration, pain management, and patient education the patient requires 24 hour a day rehabilitation nursing.  The patient is currently min A with mobility and basic ADLs.  Discharge setting and therapy post discharge at home with home health is anticipated.  Patient has agreed to participate in the Acute Inpatient Rehabilitation Program and will admit today.  Preadmission Screen Completed By:  Trish Mage, 05/28/2023 12:38 PM ______________________________________________________________________   Discussed status with Dr. Shearon Stalls on 05/28/23 at 12:00 and received approval for admission today.  Admission Coordinator:  Trish Mage, RN, time 12:14/Date 05/28/23   Assessment/Plan: Diagnosis: Does the need for close, 24 hr/day Medical supervision in concert with the patient's rehab needs make it unreasonable for this patient to be served in a less intensive setting? 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 Yes Does the patient require coordinated care of a physician, rehab nurse, PT, OT to address physical and functional deficits in the context of the above medical diagnosis(es)? Yes Addressing  deficits in the following areas: balance, endurance, locomotion, strength, transferring, bathing, dressing, feeding, grooming, and toileting Can the patient actively participate in an intensive therapy program of at least 3 hrs of therapy 5 days a week? Yes The potential for patient to make measurable gains while on inpatient rehab is good Anticipated functional outcomes upon discharge from inpatient rehab: modified independent PT, modified independent OT, Estimated rehab length of stay to reach the above functional goals is: 7-10 days Anticipated discharge destination: Home 10. Overall Rehab/Functional Prognosis: good   MD Signature:  Angelina Sheriff, DO 05/28/2023

## 2023-05-26 NOTE — Progress Notes (Signed)
PROGRESS NOTE        PATIENT DETAILS Name: Samantha [name] Age: 72 y.o. Sex: female Date of Birth: [birth year] Admit Date: 05/05/2023 Admitting Physician Meredeth Ide, MD ZOX:[id number], Odette Horns, MD  Brief Summary: Patient is a 72 y.o.  female with history of HTN, DM-2, PAD, CVA, depression/anxiety-who presented with acute hypoxic respiratory failure in the setting of pulm edema with worsening renal function.  Unfortunately-thought to have progressed to ESRD-requiring HD, for the hospital course complicated by development of acute metabolic encephalopathy, and acute calculus cholecystitis.    Significant events: 5/08>> admit to [facility]  Significant studies: 5/08>> CT chest/abdomen/pelvis: New bilateral pleural effusions, right> left parenchymal opacities 5/08>> renal ultrasound: No hydronephrosis 5/09>>Echo: EF 55-60% 5/12>> CT chest: Increasing interstitial/patchy groundglass opacities-decreasing small bilateral pleural effusions. 5/14>> gastric emptying study: Nondiagnostic due to emesis 5/24>> HIDA scan: Non- visualization of cystic duct 5/25>> CT head: No acute intracranial abnormality. 5/25>> MRA brain: Intracranial atherosclerosis including severe left M2/right A2 stenosis, moderate left V4, basilar and PCA stenosis. 5/25>> MRI brain: Punctate acute left cerebellar infarct. 5/26>> LTM EEG: No seizures.  Significant microbiology data: 5/08>> COVID PCR: Negative 5/08>> blood culture: Negative 5/11>> pleural fluid culture: No growth 5/22>> blood culture: No growth  Procedures: 5/11>> thoracocentesis (pleural fluid cytology negative) 5/11>> nontunneled dialysis catheter by PCCM 5/20>> tunneled dialysis catheter by IR 5/26>> cholecystostomy tube by IR.  Consults: PCCM Nephrology Interventional radiology General surgery Neurology  Subjective: Denies any RUQ pain-lying comfortably in bed-no chest pain or shortness of breath.  No family at bedside.   Awaiting CIR bed.  Objective: Vitals: Blood pressure 100/62, pulse 73, temperature 97.8 F (36.6 C), temperature source Oral, resp. rate 13, height 5\' 5"  (1.651 m), weight 62.4 kg, SpO2 96 %.   Exam: Gen Exam:Alert awake-not in any distress HEENT:atraumatic, normocephalic Chest: B/L clear to auscultation anteriorly CVS:S1S2 regular Abdomen:soft non tender, non distended Extremities:no edema Neurology: Non focal Skin: no rash  Pertinent Labs/Radiology:    Latest Ref Rng & Units 05/25/2023    2:18 AM 05/23/2023    1:35 PM 05/23/2023    3:54 AM  CBC  WBC 4.0 - 10.5 K/uL 14.1  25.2  23.3   Hemoglobin 12.0 - 15.0 g/dL 40.9  81.1  91.4   Hematocrit 36.0 - 46.0 % 28.4  31.8  32.2   Platelets 150 - 400 K/uL 341  351  340     Lab Results  Component Value Date   NA 128 (L) 05/25/2023   K 3.6 05/25/2023   CL 85 (L) 05/25/2023   CO2 20 (L) 05/25/2023      Assessment/Plan: AKI on CKD stage IIIb Likely hemodynamically mediated-suspicion that she may have progressed to ESRD Nephrology following and directing HD scheduled/care. Continue to monitor for signs of renal recovery  Acute hypoxic respiratory failure Secondary to acute pulm edema in the setting of worsening renal function/pleural effusion.  (PNA ruled out) Volume being managed with HD Currently improved-and on room air  Acute calculus cholecystitis  Had low-grade fever on 5/22 and subsequently worsening leukocytosis/RUQ pain on 5/24-HIDA scan positive-evaluated by general surgery-Plavix held-underwent cholecystostomy tube placement by IR on 5/26 Initially on Zosyn-should be able to transition to Augmentin-plan on total of 7 days of treatment. Follow CBC  Acute metabolic encephalopathy Had significant worsening of mental status at the end of HD on 5/25-neuroimaging negative for  CVA, LTM EEG negative for seizures.  Suspicion is for encephalopathy in the setting of cholecystitis.  Thankfully after placement of  cholecystostomy tube-she is significantly improved-and completely awake and alert. MRI brain showed a punctate acute infarct-not clear if this is artifactual or a insignificant/incidental finding.  No further recommendations by neurology at this point.  Hyponatremia Due to impaired water excretion in the setting of worsening renal function Improving with ongoing volume removal with hemodialysis.  Hypokalemia Repleted  Acute on chronic HFrEF Acute pulm edema Volume status stable with HD  History of CAD No anginal symptoms Echo with stable EF Continue Plavix/beta-blocker/statin.  HTN BP stable on Coreg Follow/optimize  HLD Statin  DM-2 (A1c 7.7 on 4/18) CBGs stable on 15 units of Semglee daily, 3 units of NovoLog with meals and SSI  Recent Labs    05/25/23 1556 05/25/23 2031 05/26/23 0812  GLUCAP 172* 89 148*     Subcentimeter left thyroid nodule Per radiology-not clinically significant-no follow-up recommendations.  BMI: Estimated body mass index is 22.89 kg/m as calculated from the following:   Height as of this encounter: 5\' 5"  (1.651 m).   Weight as of this encounter: 62.4 kg.   Code status:   Code Status: Full Code   DVT Prophylaxis: heparin injection 5,000 Units Start: 05/23/23 2200   Family Communication: Daughter at bedside.  Disposition Plan: Status is: Inpatient Remains inpatient appropriate because: Severity of illness   Planned Discharge Destination:Home   Diet: Diet Order             Diet renal/carb modified with fluid restriction Diet-HS Snack? Nothing; Fluid restriction: 1200 mL Fluid; Room service appropriate? Yes; Fluid consistency: Thin  Diet effective now                     Antimicrobial agents: Anti-infectives (From admission, onward)    Start     Dose/Rate Route Frequency Ordered Stop   05/25/23 1600  amoxicillin-clavulanate (AUGMENTIN) 500-125 MG per tablet 1 tablet        1 tablet Oral 2 times daily 05/25/23 1151  05/29/23 0759   05/21/23 1830  piperacillin-tazobactam (ZOSYN) IVPB 2.25 g  Status:  Discontinued        2.25 g 100 mL/hr over 30 Minutes Intravenous Every 8 hours 05/21/23 1740 05/25/23 1151   05/17/23 1500  ceFAZolin (ANCEF) IVPB 2g/100 mL premix        2 g 200 mL/hr over 30 Minutes Intravenous To Radiology 05/16/23 1204 05/17/23 1537   05/08/23 1000  ceFAZolin (ANCEF) IVPB 2g/100 mL premix        2 g 200 mL/hr over 30 Minutes Intravenous To Radiology 05/07/23 1537 05/09/23 1000   05/05/23 1515  cefTRIAXone (ROCEPHIN) 1 g in sodium chloride 0.9 % 100 mL IVPB        1 g 200 mL/hr over 30 Minutes Intravenous  Once 05/05/23 1510 05/05/23 1642   05/05/23 1515  azithromycin (ZITHROMAX) 500 mg in sodium chloride 0.9 % 250 mL IVPB        500 mg 250 mL/hr over 60 Minutes Intravenous  Once 05/05/23 1510 05/05/23 1750        MEDICATIONS: Scheduled Meds:  amoxicillin-clavulanate  1 tablet Oral BID   atorvastatin  80 mg Oral Daily   bisacodyl  10 mg Rectal Daily   carvedilol  6.25 mg Oral BID WC   Chlorhexidine Gluconate Cloth  6 each Topical Q0600   clopidogrel  75 mg Oral Daily   diphenhydrAMINE  25 mg Intravenous Once   docusate sodium  100 mg Oral BID   fluticasone  2 spray Each Nare Daily   guaiFENesin  600 mg Oral BID   heparin injection (subcutaneous)  5,000 Units Subcutaneous Q8H   insulin aspart  0-15 Units Subcutaneous TID WC   insulin aspart  0-5 Units Subcutaneous QHS   insulin aspart  3 Units Subcutaneous TID WC   insulin glargine-yfgn  15 Units Subcutaneous Daily   oxymetazoline  1 spray Each Nare Once   polyethylene glycol  17 g Oral BID   sevelamer carbonate  1,600 mg Oral TID WC   sodium chloride flush  3 mL Intravenous Q12H   Continuous Infusions:  sodium chloride Stopped (05/09/23 2241)   PRN Meds:.sodium chloride, acetaminophen, albuterol, HYDROmorphone (DILAUDID) injection, iohexol, LORazepam, melatonin, ondansetron **OR** ondansetron (ZOFRAN) IV, mouth rinse,  oxyCODONE, sodium chloride, sodium chloride flush   I have personally reviewed following labs and imaging studies  LABORATORY DATA: CBC: Recent Labs  Lab 05/21/23 0405 05/22/23 0308 05/23/23 0354 05/23/23 1335 05/25/23 0218  WBC 20.5* 20.7* 23.3* 25.2* 14.1*  NEUTROABS  --   --   --  22.3*  --   HGB 10.5* 10.7* 11.0* 10.8* 10.0*  HCT 30.0* 31.1* 32.2* 31.8* 28.4*  MCV 91.2 91.5 94.7 91.6 89.6  PLT 272 322 340 351 341     Basic Metabolic Panel: Recent Labs  Lab 05/21/23 0405 05/22/23 0309 05/23/23 0354 05/24/23 0335 05/25/23 0218  NA 131* 131* 133* 134* 128*  K 3.9 4.1 3.8 4.2 3.6  CL 93* 92* 91* 91* 85*  CO2 24 21* 21* 20* 20*  GLUCOSE 156* 182* 237* 243* 117*  BUN 31* 60* 46* 79* 95*  CREATININE 4.14* 5.87* 5.21* 7.32* 8.39*  CALCIUM 9.1 9.3 9.1 9.2 8.1*  PHOS 5.3* 7.9* 7.0* 8.4* 7.9*     GFR: Estimated Creatinine Clearance: 5.7 mL/min (A) (by C-G formula based on SCr of 8.39 mg/dL (H)).  Liver Function Tests: Recent Labs  Lab 05/21/23 0405 05/22/23 0308 05/22/23 0309 05/23/23 0354 05/24/23 0335 05/25/23 0218  AST 39 33  --  68*  --   --   ALT 18 16  --  24  --   --   ALKPHOS 91 100  --  153*  --   --   BILITOT 1.1 1.0  --  1.0  --   --   PROT 7.1 7.5  --  7.7  --   --   ALBUMIN 3.0* 2.7* 2.8* 2.8* 2.5* 2.1*    No results for input(s): "LIPASE", "AMYLASE" in the last 168 hours. Recent Labs  Lab 05/22/23 1621  AMMONIA 15     Coagulation Profile: No results for input(s): "INR", "PROTIME" in the last 168 hours.   Cardiac Enzymes: No results for input(s): "CKTOTAL", "CKMB", "CKMBINDEX", "TROPONINI" in the last 168 hours.  BNP (last 3 results) No results for input(s): "PROBNP" in the last 8760 hours.  Lipid Profile: No results for input(s): "CHOL", "HDL", "LDLCALC", "TRIG", "CHOLHDL", "LDLDIRECT" in the last 72 hours.  Thyroid Function Tests: No results for input(s): "TSH", "T4TOTAL", "FREET4", "T3FREE", "THYROIDAB" in the last 72  hours.  Anemia Panel: Recent Labs    05/25/23 0218  FERRITIN 632*  TIBC 172*  IRON 13*     Urine analysis:    Component Value Date/Time   COLORURINE YELLOW 05/05/2023 0927   APPEARANCEUR CLEAR 05/05/2023 0927   LABSPEC 1.010 05/05/2023 0927   PHURINE 6.0 05/05/2023 1610  GLUCOSEU 50 (A) 05/05/2023 0927   HGBUR NEGATIVE 05/05/2023 0927   BILIRUBINUR NEGATIVE 05/05/2023 0927   BILIRUBINUR negative 08/18/2021 1207   BILIRUBINUR neg 06/28/2017 1508   KETONESUR NEGATIVE 05/05/2023 0927   PROTEINUR 100 (A) 05/05/2023 0927   UROBILINOGEN 0.2 08/18/2021 1207   UROBILINOGEN 0.2 01/15/2015 1032   NITRITE NEGATIVE 05/05/2023 0927   LEUKOCYTESUR NEGATIVE 05/05/2023 0927    Sepsis Labs: Lactic Acid, Venous    Component Value Date/Time   LATICACIDVEN 2.22 (H) 01/10/2015 1339    MICROBIOLOGY: Recent Results (from the past 240 hour(s))  Culture, blood (Routine X 2) w Reflex to ID Panel     Status: None   Collection Time: 05/19/23  6:06 PM   Specimen: BLOOD LEFT ARM  Result Value Ref Range Status   Specimen Description BLOOD LEFT ARM  Final   Special Requests   Final    BOTTLES DRAWN AEROBIC AND ANAEROBIC Blood Culture adequate volume   Culture   Final    NO GROWTH 5 DAYS Performed at [facility], [address], [place], [place] [address]    Report Status 05/24/2023 FINAL  Final  Culture, blood (Routine X 2) w Reflex to ID Panel     Status: None   Collection Time: 05/19/23  6:06 PM   Specimen: BLOOD LEFT ARM  Result Value Ref Range Status   Specimen Description BLOOD LEFT ARM  Final   Special Requests   Final    BOTTLES DRAWN AEROBIC AND ANAEROBIC Blood Culture adequate volume   Culture   Final    NO GROWTH 5 DAYS Performed at [facility], [address], [place], [place] [address]    Report Status 05/24/2023 FINAL  Final  Aerobic/Anaerobic Culture w Gram Stain (surgical/deep wound)     Status: None (Preliminary result)   Collection Time: 05/23/23  11:27 AM   Specimen: BILE  Result Value Ref Range Status   Specimen Description BILE  Final   Special Requests NONE  Final   Gram Stain NO WBC SEEN NO ORGANISMS SEEN   Final   Culture   Final    NO GROWTH 3 DAYS NO ANAEROBES ISOLATED; CULTURE IN PROGRESS FOR 5 DAYS Performed at [facility], [address], [place], [place] [address]    Report Status PENDING  Incomplete    RADIOLOGY STUDIES/RESULTS: No results found.   LOS: 21 days   Jeoffrey Massed, MD  [facility]    To contact the attending provider between 7A-7P or the covering provider during after hours 7P-7A, please log into the web site [email or web address] and access using universal [place] password for that web site. If you do not have the password, please call the hospital operator.  05/26/2023, 9:57 AM

## 2023-05-26 NOTE — Progress Notes (Signed)
[facility] NEPHROLOGY PROGRESS NOTE  Assessment/ Plan: Pt is a 72 y.o. yo female with a past medical history significant for hypertension, type II DM, PAD, CVA, HLD, CKD 3B who presented with shortness of breath and fluid overload.  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  OP HD arranged at [facility] [place] on TTS 11:30 chair time but now is saying that [facility] wont accept so need to look for another unit. Plan for PD later on it seems but will need to wait until cholecystitis is better. If not going to pursue PD will need AV access HD tomorrow, to continue TTS schedule via [facility]  # Acute hypoxic respiratory failure due to fluid overload: Received thoracocentesis and diuresis.  Resolved. Now managing volume with HD.  # HTN/volume: BP acceptable.  No edema.  UF as tolerated.  Discontinue Lasix. Only on low dose coreg  # Anemia: Hemoglobin at goal-  no treatment needed as of yet but getting close-  iron low, will replete  # Acute cholecystitis: HIDA scan was positive therefore on antibiotics.  Seen by general surgery and IR.  S/p cholecystostomy tube placement.  # Acute metabolic encephalopathy - resolved  Bones-  phos up-  calc WNL-  added renvela.  PTH pending  Subjective:  HD yest-  removed 1000- tolerated well.  Seen in room.  No new event.  She is alert awake.  No chest pain or shortness of breath. Family tells me they are trying to get her into inpatient rehab- this is still pending  Objective Vital signs in last 24 hours: Vitals:   05/26/23 0400 05/26/23 0540 05/26/23 0813 05/26/23 1148  BP:  (!) 140/86 100/62   Pulse: 71 73    Resp: 19 13    Temp:  98 F (36.7 C) 97.8 F (36.6 C) 97.7 F (36.5 C)  TempSrc:  Oral Oral Oral  SpO2: 94% 96%    Weight:      Height:       Weight change: 0.5 kg  Intake/Output Summary (Last 24 hours) at  05/26/2023 1257 Last data filed at 05/26/2023 1610 Gross per 24 hour  Intake 243 ml  Output 875 ml  Net -632 ml       Labs: RENAL PANEL Recent Labs  Lab 05/21/23 0405 05/22/23 0308 05/22/23 0309 05/23/23 0354 05/24/23 0335 05/25/23 0218  NA 131*  --  131* 133* 134* 128*  K 3.9  --  4.1 3.8 4.2 3.6  CL 93*  --  92* 91* 91* 85*  CO2 24  --  21* 21* 20* 20*  GLUCOSE 156*  --  182* 237* 243* 117*  BUN 31*  --  60* 46* 79* 95*  CREATININE 4.14*  --  5.87* 5.21* 7.32* 8.39*  CALCIUM 9.1  --  9.3 9.1 9.2 8.1*  PHOS 5.3*  --  7.9* 7.0* 8.4* 7.9*  ALBUMIN 3.0* 2.7* 2.8* 2.8* 2.5* 2.1*    Liver Function Tests: Recent Labs  Lab 05/21/23 0405 05/22/23 0308 05/22/23 0309 05/23/23 0354 05/24/23 0335 05/25/23 0218  AST 39 33  --  68*  --   --   ALT 18 16  --  24  --   --   ALKPHOS 91 100  --  153*  --   --   BILITOT 1.1 1.0  --  1.0  --   --  PROT 7.1 7.5  --  7.7  --   --   ALBUMIN 3.0* 2.7*   < > 2.8* 2.5* 2.1*   < > = values in this interval not displayed.   No results for input(s): "LIPASE", "AMYLASE" in the last 168 hours. Recent Labs  Lab 05/22/23 1621  AMMONIA 15   CBC: Recent Labs    05/21/23 0405 05/22/23 0308 05/23/23 0354 05/23/23 1335 05/25/23 0218  HGB 10.5* 10.7* 11.0* 10.8* 10.0*  MCV 91.2 91.5 94.7 91.6 89.6  FERRITIN  --   --   --   --  632*  TIBC  --   --   --   --  172*  IRON  --   --   --   --  13*    Cardiac Enzymes: No results for input(s): "CKTOTAL", "CKMB", "CKMBINDEX", "TROPONINI" in the last 168 hours. CBG: Recent Labs  Lab 05/25/23 1259 05/25/23 1556 05/25/23 2031 05/26/23 0812 05/26/23 1146  GLUCAP 98 172* 89 148* 193*    Iron Studies:  Recent Labs    05/25/23 0218  IRON 13*  TIBC 172*  FERRITIN 632*   Studies/Results: No results found.  Medications: Infusions:  sodium chloride Stopped (05/09/23 2241)    Scheduled Medications:  amoxicillin-clavulanate  1 tablet Oral BID   atorvastatin  80 mg Oral Daily    bisacodyl  10 mg Rectal Daily   carvedilol  6.25 mg Oral BID WC   Chlorhexidine Gluconate Cloth  6 each Topical Q0600   clopidogrel  75 mg Oral Daily   diphenhydrAMINE  25 mg Intravenous Once   docusate sodium  100 mg Oral BID   fluticasone  2 spray Each Nare Daily   guaiFENesin  600 mg Oral BID   heparin injection (subcutaneous)  5,000 Units Subcutaneous Q8H   insulin aspart  0-15 Units Subcutaneous TID WC   insulin aspart  0-5 Units Subcutaneous QHS   insulin aspart  3 Units Subcutaneous TID WC   insulin glargine-yfgn  15 Units Subcutaneous Daily   oxymetazoline  1 spray Each Nare Once   polyethylene glycol  17 g Oral BID   sevelamer carbonate  1,600 mg Oral TID WC   sodium chloride flush  3 mL Intravenous Q12H    have reviewed scheduled and prn medications.  Physical Exam: General:NAD, comfortable Heart:RRR, s1s2 nl Lungs:clear b/l, no crackle Abdomen:soft, mild tenderness, not distended. Extremities:No edema Dialysis Access: TDC  Kauri Garson A Lynesha Bango 05/26/2023,12:57 PM  LOS: 21 days

## 2023-05-26 NOTE — Progress Notes (Signed)
Referring Physician(s): Dr Carolynne Edouard   Supervising Physician: Simonne Come  Patient Status:  Samantha [name] - In-pt  Chief Complaint:  Cholecystitis  Subjective: Procedure: Percutaneous cholecystostomy tube placement in IR 5/26 Feeling better; resting well Eating better   Drain intact; c/d/I Flushes easily  Allergies: Chicken allergy and Perflutren  Medications: Prior to Admission medications   Medication Sig Start Date End Date Taking? Authorizing Provider  amLODipine (NORVASC) 10 MG tablet Take 1 tablet (10 mg total) by mouth daily. 04/12/23  Yes Hoy Register, MD  atorvastatin (LIPITOR) 80 MG tablet Take 1 tablet (80 mg total) by mouth daily. 04/12/23  Yes Hoy Register, MD  cephALEXin (KEFLEX) 500 MG capsule Take 500 mg by mouth 2 (two) times daily.   Yes [provider]  clopidogrel (PLAVIX) 75 MG tablet Take 1 tablet (75 mg total) by mouth daily. 04/12/23  Yes Hoy Register, MD  diltiazem (CARDIZEM SR) 120 MG 12 hr capsule Take 120 mg by mouth daily.   Yes [provider]  furosemide (LASIX) 40 MG tablet Take 1 tablet (40 mg total) by mouth daily for 7 days. 04/18/23 05/05/23 Yes Burnadette Pop, MD  glimepiride (AMARYL) 4 MG tablet Take 1 tablet (4 mg total) by mouth daily with breakfast. 04/12/23  Yes Newlin, Enobong, MD  hydrALAZINE (APRESOLINE) 50 MG tablet Take 1 tablet (50 mg total) by mouth every 8 (eight) hours. 04/18/23  Yes Burnadette Pop, MD  sodium bicarbonate 650 MG tablet Take 1 tablet (650 mg total) by mouth 3 (three) times daily. 04/18/23  Yes Burnadette Pop, MD  glucose blood test strip Use 3 times daily before meals 07/21/18   Hoy Register, MD  insulin isophane & regular human KwikPen (HUMULIN 70/30 KWIKPEN) (70-30) 100 UNIT/ML KwikPen Inject 30 Units into the skin in the morning AND 25 Units every evening. Patient taking differently: Inject 30 Units into the skin in the morning AND 26 Units every evening. 04/12/23   Hoy Register, MD  Insulin  Syringe-Needle U-100 (BD INSULIN SYRINGE ULTRAFINE) 31G X 15/64" 0.5 ML MISC Use subcutaneously twice daily 01/28/17   Hoy Register, MD  TRUEPLUS LANCETS 28G MISC 1 each by Does not apply route 3 (three) times daily before meals. 12/23/16   Hoy Register, MD     Vital Signs: BP 100/62 (BP Location: Left Arm)   Pulse 73   Temp 97.7 F (36.5 C) (Oral)   Resp 13   Ht 5\' 5"  (1.651 m)   Wt 137 lb 9.1 oz (62.4 kg)   SpO2 96%   BMI 22.89 kg/m   Physical Exam Vitals reviewed.  Skin:    General: Skin is warm.     Comments: Site is NT No bleeding No sign of infection     Imaging: IR Perc Cholecystostomy  Result Date: 05/25/2023 INDICATION: 72 year old female with acute cholecystitis. She has a poor operative candidate and presents for cholecystostomy tube placement. EXAM: CHOLECYSTOSTOMY MEDICATIONS: In patient currently receiving intravenous antibiotics. No additional antibiotic prophylaxis was administered. ANESTHESIA/SEDATION: Moderate (conscious) sedation was employed during this procedure. A total of Versed 1.5 mg and Fentanyl 75 mcg was administered intravenously. Moderate Sedation Time: 21 minutes. The patient's level of consciousness and vital signs were monitored continuously by radiology nursing throughout the procedure under my direct supervision. FLUOROSCOPY TIME:  Radiation exposure index: 4.6 mGy COMPLICATIONS: None immediate. PROCEDURE: Informed written consent was obtained from the patient after a thorough discussion of the procedural risks, benefits and alternatives. All questions were addressed. Maximal Sterile Barrier  Technique was utilized including caps, mask, sterile gowns, sterile gloves, sterile drape, hand hygiene and skin antiseptic. A timeout was performed prior to the initiation of the procedure. The right upper quadrant was interrogated with ultrasound. The massively distended gallbladder was successfully identified. A suitable skin entry site was selected and  marked. Local anesthesia was attained by infiltration with 1% lidocaine. A small dermatotomy was made. Under real-time ultrasound guidance, a 21 gauge trocar needle was advanced along a short transhepatic tract and into the gallbladder lumen. A wire was then advanced into the gallbladder lumen. The needle was exchanged and the gall to transitional dilator was advanced over the wire and into the gallbladder lumen. Aspiration was performed yielding black bile. A small amount of contrast was injected confirming that the device was indeed within the gallbladder lumen. Images were obtained and stored for the medical record. A 0.035 wire was then advanced in the gallbladder lumen. The percutaneous tract was dilated to 10 [place]. A Cook 10 [place] all-purpose drainage catheter was advanced over the wire and formed in the gallbladder lumen. Several 100 mL of viscous black bile were then aspirated. A sample was sent for Gram stain and culture. The catheter was connected to bag drainage and secured to the skin with 0 Prolene suture. Bandages were applied. The patient tolerated the procedure well. IMPRESSION: Successful placement of transhepatic percutaneous cholecystostomy tube for the treatment of acute cholecystitis. Electronically Signed   By: Malachy Moan M.D.   On: 05/25/2023 08:54   EEG adult  Result Date: 05/23/2023 Charlsie Quest, MD     05/23/2023  6:27 AM Patient Name: Samantha [name] MRN: [id number] Epilepsy Attending: Charlsie Quest Referring Physician/Provider: Maretta Bees, MD Date: 05/22/2023 Duration: 27.33 mins Patient history: 72yo F with ams getting eeg to evaluate for seizure. Level of alertness: lethargic AEDs during EEG study: None Technical aspects: This EEG study was done with scalp electrodes positioned according to the 10-20 International system of electrode placement. Electrical activity was reviewed with band pass filter of 1-70Hz , sensitivity of 7 uV/mm, display speed of  81mm/sec with a 60Hz  notched filter applied as appropriate. EEG data were recorded continuously and digitally stored.  Video monitoring was available and reviewed as appropriate. Description: No clear posterior dominant rhythm was seen. EEG showed continuous generalized 3 to 7 Hz theta-delta slowing. Intermittent generalized periodic discharges with triphasic morphology at 2 Hz were also noted. Physiologic photic driving was not seen during photic stimulation.  Hyperventilation was not performed.   ABNORMALITY - Periodic discharges with triphasic morphology, generalized ( GPDs) - Continuous slow, generalized IMPRESSION: This study showed generalized periodic discharges with triphasic morphology which is most likely secondary to toxic-metabolic causes. However, the frequency of 2hz  can rarely evolve into ictal-interictal continuum. Recommend long term monitoring for better evaluation. Additionally there is moderate diffuse encephalopathy. No seizures were seen throughout the recording. Priyanka O Yadav    Labs:  CBC: Recent Labs    05/22/23 0308 05/23/23 0354 05/23/23 1335 05/25/23 0218  WBC 20.7* 23.3* 25.2* 14.1*  HGB 10.7* 11.0* 10.8* 10.0*  HCT 31.1* 32.2* 31.8* 28.4*  PLT 322 340 351 341    COAGS: Recent Labs    05/17/23 0433  INR 1.1    BMP: Recent Labs    05/22/23 0309 05/23/23 0354 05/24/23 0335 05/25/23 0218  NA 131* 133* 134* 128*  K 4.1 3.8 4.2 3.6  CL 92* 91* 91* 85*  CO2 21* 21* 20* 20*  GLUCOSE 182* 237* 243*  117*  BUN 60* 46* 79* 95*  CALCIUM 9.3 9.1 9.2 8.1*  CREATININE 5.87* 5.21* 7.32* 8.39*  GFRNONAA 7* 8* 6* 5*    LIVER FUNCTION TESTS: Recent Labs    05/07/23 0532 05/08/23 0636 05/21/23 0405 05/22/23 0308 05/22/23 0309 05/23/23 0354 05/24/23 0335 05/25/23 0218  BILITOT 0.7  --  1.1 1.0  --  1.0  --   --   AST 27  --  39 33  --  68*  --   --   ALT 22  --  18 16  --  24  --   --   ALKPHOS 63  --  91 100  --  153*  --   --   PROT 6.4*  --  7.1  7.5  --  7.7  --   --   ALBUMIN 3.1*   < > 3.0* 2.7* 2.8* 2.8* 2.5* 2.1*   < > = values in this interval not displayed.    Drain Location: RUQ Size: Fr size: 10 Fr Date of placement: 05/23/23  Currently to: Drain collection device: gravity 24 hour output:  Output by Drain (mL) 05/24/23 0701 - 05/24/23 1900 05/24/23 1901 - 05/25/23 0700 05/25/23 0701 - 05/25/23 1900 05/25/23 1901 - 05/26/23 0700 05/26/23 0701 - 05/26/23 1445  Biliary Tube RLQ  250  475     Interval imaging/drain manipulation:  None  Current examination: Flushes/aspirates easily.  Insertion site unremarkable. Suture and stat lock in place. Dressed appropriately.   Plan: Continue TID flushes with 5 cc NS. Record output Q shift. Dressing changes QD or PRN if soiled.  Call IR APP or on call IR MD if difficulty flushing or sudden change in drain output.   Discharge planning: Please contact IR APP or on call IR MD prior to patient d/c to ensure appropriate follow up plans are in place.  IR scheduler will contact patient with date/time of appointment. Patient will need to flush drain QD with 5 cc NS, record output QD, dressing changes every 2-3 days or earlier if soiled.   IR will continue to follow - please call with questions or concerns.  Assessment and Plan:  Perc chole drain in place To follow with [facility]-- plans per [facility] Pt will hear from IR OP scheduler for 8 week follow up time and date Should flush drain at home 5-10 cc daily Record output  Electronically Signed: Robet Leu, PA-C 05/26/2023, 2:44 PM   I spent a total of 15 Minutes at the the patient's bedside AND on the patient's [name] floor or unit, greater than 50% of which was counseling/coordinating care for perc chole drain

## 2023-05-26 NOTE — Plan of Care (Signed)

## 2023-05-26 NOTE — Progress Notes (Addendum)
Advised yesterday that pt's medicaid has been approved. Hospital records indicate that pt has [place] Medicaid [employer] of . Pt's insurance is not accepted by [facility] for out-pt HD. Contacted [facility] admissions to be advised of pt's new coverage. [facility] to review pt's case and new insurance. It is very likely that a new referral will need to be made to another local HD provider who accepts pt's insurance. Contacted pt's daughter via phone. Message left requesting a return call to discuss the above situation. Update provided to attending, nephrologist, rehab coordinator, and CSW. Will assist as needed.   Olivia Canter Renal Navigator [phone]  Addendum at 12:40 pm: [facility] cannot accept pt due to pt's managed medicaid. Spoke to pt's daughter via phone. Explained situation. Closest non-[facility] clinic to pt's home is [facility]. Contacted Beverly with [facility] out-pt HD to make referral. Clinicals faxed to [facility] for review.

## 2023-05-26 NOTE — Evaluation (Signed)
Occupational Therapy Evaluation Patient Details Name: Samantha [name] MRN: [id number] DOB: Sep 01, 1951 Today's Date: 05/26/2023   History of Present Illness 72 y.o. female who presented on 5/8 with acute hypoxic respiratory failure in the setting of pulm edema with worsening renal function. CT of chest found new bilateral pleural effusions, right> left parenchymal opacities. 5/11 thoracocentesis. PMHx: HTN, HLD, PAD, DM2, CVA, depression/anxiety.   Clinical Impression   At baseline, pt was Independent with ADLs and functional transfers/mobility without an AD. Pt presents with decreased activity tolerance, decreased general B UE strength, decreased sitting/standing balance and tolerance, and decreased safety and independence with ADLs and functional transfers/mobility. Pt currently performs UB ADLs with Mod I to Min assist, LB ADLs with Min to Max assist, and functional transfers with Min-Mod assist. Pt will benefit from acute skilled OT services to address deficits outlined below, decrease caregiver burden, and increase safety and independence with ADLs and functional transfers/mobility. Post acute discharge, pt will benefit from intensive inpatient skilled OT services >3 hours per day to maximize rehab potential.      Recommendations for follow up therapy are one component of a multi-disciplinary discharge planning process, led by the attending physician.  Recommendations may be updated based on patient status, additional functional criteria and insurance authorization.   Assistance Recommended at Discharge Frequent or constant Supervision/Assistance  Patient can return home with the following A lot of help with walking and/or transfers;A lot of help with bathing/dressing/bathroom;Assistance with cooking/housework;Assist for transportation;Help with stairs or ramp for entrance    Functional Status Assessment  Patient has had a recent decline in their functional status and demonstrates the  ability to make significant improvements in function in a reasonable and predictable amount of time.  Equipment Recommendations  Other (comment) (defer to next level of care)    Recommendations for Other Services       Precautions / Restrictions Precautions Precautions: Fall Precaution Comments: denies falls in past 6 months Restrictions Weight Bearing Restrictions: No      Mobility Bed Mobility Overal bed mobility: Needs Assistance Bed Mobility: Supine to Sit, Sit to Supine     Supine to sit: Min assist, HOB elevated Sit to supine: Min assist        Transfers Overall transfer level: Needs assistance Equipment used: Rolling walker (2 wheels) Transfers: Sit to/from Stand, Bed to chair/wheelchair/BSC Sit to Stand: Min assist, Mod assist     Step pivot transfers: Mod assist            Balance Overall balance assessment: Needs assistance Sitting-balance support: Single extremity supported, Bilateral upper extremity supported, Feet supported Sitting balance-Leahy Scale: Fair   Postural control: Posterior lean Standing balance support: Bilateral upper extremity supported, During functional activity, Reliant on assistive device for balance Standing balance-Leahy Scale: Poor                             ADL either performed or assessed with clinical judgement   ADL Overall ADL's : Needs assistance/impaired Eating/Feeding: Modified independent   Grooming: Set up;Min guard;Sitting (Decreased sitting balance/tolerance)   Upper Body Bathing: Minimal assistance;Moderate assistance;Sitting (with extra time; decreased sitting balance/tolerance)   Lower Body Bathing: Moderate assistance;Cueing for compensatory techniques;Sit to/from stand (decreased standing and sitting balance/tolerance)   Upper Body Dressing : Min guard;Sitting (decreased sitting balance/tolerance)   Lower Body Dressing: Minimal assistance;Moderate assistance;Cueing for compensatory  techniques;Sit to/from stand (Min to Mod assist to maintain balance during stand)   Toilet  Transfer: Minimal assistance;Moderate assistance;BSC/3in1 (step pivot)   Toileting- Clothing Manipulation and Hygiene: Maximal assistance;Sit to/from stand;Minimal assistance (Min assist follwoing urination, Max assist following BM)         General ADL Comments: Pt declined funcitonal mobility attempt this session.     Vision Baseline Vision/History: 4 Cataracts;0 No visual deficits (R eye cataracts; Left eye WFL) Ability to See in Adequate Light: 0 Adequate Patient Visual Report: Peripheral vision impairment;No change from baseline (Right side peripheral vision deficts) Vision Assessment?: Yes Eye Alignment: Within Functional Limits Ocular Range of Motion: Within Functional Limits Alignment/Gaze Preference: Within Defined Limits Tracking/Visual Pursuits: Decreased smoothness of horizontal tracking (Bilateral) Saccades: Additional eye shifts occurred during testing (Bilateral) Convergence: Within functional limits Additional Comments: Right side peripheral vision deficits. Pt reports this is her baseline.     Perception     Praxis Praxis Praxis tested?: Within functional limits    Pertinent Vitals/Pain Pain Assessment Pain Assessment: No/denies pain     Hand Dominance Right   Extremity/Trunk Assessment Upper Extremity Assessment Upper Extremity Assessment: Generalized weakness   Lower Extremity Assessment Lower Extremity Assessment: Defer to PT evaluation   Cervical / Trunk Assessment Cervical / Trunk Assessment: Normal   Communication Communication Communication: Prefers language other than English (Daughter present throughout session providing interpretation per family's preference)   Cognition Arousal/Alertness: Awake/alert Behavior During Therapy: Flat affect Overall Cognitive Status: Within Functional Limits for tasks assessed                                        General Comments  VSS on RA. Pt limited by fatigue. However, pt demonstrated and verbalized motivation to participate in skilled rehab services to return to lving with daughter and being as independent as possible. Per chart review, pt was previously limited by pain during PPT sessions, but reported no pain on this day.    Exercises     Shoulder Instructions      Home Living Family/patient expects to be discharged to:: Private residence Living Arrangements: Children Available Help at Discharge: Family;Available 24 hours/day Type of Home: House Home Access: Stairs to enter [employer] of Steps: 3 Entrance Stairs-Rails: Left Home Layout: One level     Bathroom Shower/Tub: Chief Strategy Officer: Standard     Home Equipment: Agricultural consultant (2 wheels);BSC/3in1   Additional Comments: lives with daughter      Prior Functioning/Environment Prior Level of Function : Independent/Modified Independent             Mobility Comments: independent without AD, no falls ADLs Comments: independent        OT Problem List: Decreased strength;Decreased activity tolerance;Impaired balance (sitting and/or standing);Decreased knowledge of use of DME or AE;Decreased knowledge of precautions      OT Treatment/Interventions: Self-care/ADL training;Therapeutic exercise;Energy conservation;DME and/or AE instruction;Therapeutic activities;Patient/family education;Balance training    OT Goals(Current goals can be found in the care plan section) Acute Rehab OT Goals Patient Stated Goal: to get stronger and return home with daughter OT Goal Formulation: With patient/family Time For Goal Achievement: 06/09/23 Potential to Achieve Goals: Good ADL Goals Pt Will Perform Upper Body Bathing: with supervision;sitting Pt Will Perform Lower Body Bathing: with min guard assist;sit to/from stand Pt Will Perform Lower Body Dressing: with min guard assist;sit to/from  stand Pt Will Transfer to Toilet: with supervision;ambulating;bedside commode Pt Will Perform Toileting - Clothing Manipulation and hygiene: sit  to/from stand;with min guard assist  OT Frequency: Min 2X/week    Co-evaluation              AM-PAC OT "6 Clicks" Daily Activity     Outcome Measure Help from another person eating meals?: A Little Help from another person taking care of personal grooming?: A Little Help from another person toileting, which includes using toliet, bedpan, or urinal?: A Lot Help from another person bathing (including washing, rinsing, drying)?: A Lot Help from another person to put on and taking off regular upper body clothing?: A Little Help from another person to put on and taking off regular lower body clothing?: A Lot 6 Click Score: 15   End of Session Equipment Utilized During Treatment: Rolling walker (2 wheels);Other (comment) [facility]) Nurse Communication: Mobility status;Other (comment) (Need for new PureWick following episode of bowel incontinence)  Activity Tolerance: Patient tolerated treatment well;Patient limited by fatigue Patient left: in bed;with call bell/phone within reach;with bed alarm set;with family/visitor present  OT Visit Diagnosis: Unsteadiness on feet (R26.81);Other abnormalities of gait and mobility (R26.89);Muscle weakness (generalized) (M62.81)                Time: 1610-9604 OT Time Calculation (min): 27 min Charges:  OT General Charges $OT Visit: 1 Visit OT Evaluation $OT Eval Low Complexity: 1 Low OT Treatments $Self Care/Home Management : 8-22 mins  .Paula Compton 05/26/2023, 11:58 AM

## 2023-05-26 NOTE — Progress Notes (Signed)
Inpatient Rehab Admissions Coordinator:    [facility] following for potential admit. I will send case to insurance once OT note for today is in. Spoke with Pt. And daughter Debarah Crape to update them and they affirmed that they want Pt. To come to CIR before going home with daughter, [name].   Megan Salon, MS, CCC-SLP Rehab Admissions Coordinator  [phone] (celll) [phone] (office)

## 2023-05-27 DIAGNOSIS — J9601 Acute respiratory failure with hypoxia: Secondary | ICD-10-CM | POA: Diagnosis not present

## 2023-05-27 LAB — GLUCOSE, CAPILLARY
Glucose-Capillary: 110 mg/dL — ABNORMAL HIGH (ref 70–99)
Glucose-Capillary: 109 mg/dL — ABNORMAL HIGH (ref 70–99)
Glucose-Capillary: 183 mg/dL — ABNORMAL HIGH (ref 70–99)
Glucose-Capillary: 117 mg/dL — ABNORMAL HIGH (ref 70–99)

## 2023-05-27 LAB — RENAL FUNCTION PANEL
Albumin: 2.3 g/dL — ABNORMAL LOW (ref 3.5–5.0)
Anion gap: 17 — ABNORMAL HIGH (ref 5–15)
BUN: 58 mg/dL — ABNORMAL HIGH (ref 8–23)
CO2: 23 mmol/L (ref 22–32)
Calcium: 8.8 mg/dL — ABNORMAL LOW (ref 8.9–10.3)
Chloride: 84 mmol/L — ABNORMAL LOW (ref 98–111)
Creatinine, Ser: 6.48 mg/dL — ABNORMAL HIGH (ref 0.44–1.00)
GFR, Estimated: 6 mL/min — ABNORMAL LOW (ref >60)
Glucose, Bld: 104 mg/dL — ABNORMAL HIGH (ref 70–99)
Phosphorus: 6.2 mg/dL — ABNORMAL HIGH (ref 2.5–4.6)
Potassium: 4.1 mmol/L (ref 3.5–5.1)
Sodium: 124 mmol/L — ABNORMAL LOW (ref 135–145)

## 2023-05-27 LAB — CBC
HCT: 29.7 % — ABNORMAL LOW (ref 36.0–46.0)
Hemoglobin: 10.4 g/dL — ABNORMAL LOW (ref 12.0–15.0)
MCH: 31.9 pg (ref 26.0–34.0)
MCHC: 35 g/dL (ref 30.0–36.0)
MCV: 91.1 fL (ref 80.0–100.0)
Platelets: 351 10*3/uL (ref 150–400)
RBC: 3.26 MIL/uL — ABNORMAL LOW (ref 3.87–5.11)
RDW: 12.5 % (ref 11.5–15.5)
WBC: 8.1 10*3/uL (ref 4.0–10.5)
nRBC: 0 % (ref 0.0–0.2)

## 2023-05-27 LAB — PTH, INTACT AND CALCIUM
Calcium, Total (PTH): 9 mg/dL (ref 8.7–10.3)
PTH: 106 pg/mL — ABNORMAL HIGH (ref 15–65)

## 2023-05-27 MED ORDER — HEPARIN SODIUM (PORCINE) 1000 UNIT/ML DIALYSIS
1000.0000 [IU] | INTRAMUSCULAR | Status: DC | PRN
  Administered 2023-05-27: 1000 [IU]
  Filled 2023-05-27 (×2): qty 1

## 2023-05-27 MED ORDER — HEPARIN SODIUM (PORCINE) 1000 UNIT/ML DIALYSIS
20.0000 [IU]/kg | INTRAMUSCULAR | Status: DC | PRN
  Administered 2023-05-27: 1200 [IU] via INTRAVENOUS_CENTRAL
  Filled 2023-05-27 (×2): qty 2

## 2023-05-27 MED ORDER — ALTEPLASE 2 MG IJ SOLR: 2.0000 mg | Freq: Once | INTRAMUSCULAR | Status: DC | PRN

## 2023-05-27 MED ORDER — ANTICOAGULANT SODIUM CITRATE 4% (200MG/5ML) IV SOLN: 5.0000 mL | Status: DC | PRN

## 2023-05-27 NOTE — Progress Notes (Signed)
Physical Therapy Treatment Patient Details Name: Samantha [name] MRN: [id number] DOB: August 25, 1951 Today's Date: 05/27/2023   History of Present Illness 72 y.o. female who presented on 5/8 with acute hypoxic respiratory failure in the setting of pulm edema with worsening renal function. CT of chest found new bilateral pleural effusions, right> left parenchymal opacities. 5/11 thoracocentesis. PMHx: HTN, HLD, PAD, DM2, CVA, depression/anxiety.    PT Comments    Pt tolerated today's session fairly, limited by nausea, pain, and fatigue. Pt denying any attempts at EOB or OOB mobility, even with maximal encouragement, agreeable to scooting back into bed as she was found long sitting in bed, pillows placed behind back with HOB elevated for comfort and support. Pt agreeable to BLE exercises and tolerating 1 BUE exercise before reporting fatigue. Pt reports wanting to get better but not feeling well after dialysis. Acute PT will continue to follow up with pt as appropriate, discharge recommendations remain appropriate pending progression next session.     Recommendations for follow up therapy are one component of a multi-disciplinary discharge planning process, led by the attending physician.  Recommendations may be updated based on patient status, additional functional criteria and insurance authorization.  Follow Up Recommendations       Assistance Recommended at Discharge Frequent or constant Supervision/Assistance  Patient can return home with the following A lot of help with walking and/or transfers;A little help with bathing/dressing/bathroom;Assistance with cooking/housework;Help with stairs or ramp for entrance;Assist for transportation   Equipment Recommendations  Other (comment) (TBD)    Recommendations for Other Services       Precautions / Restrictions Precautions Precautions: Fall Restrictions Weight Bearing Restrictions: No     Mobility  Bed Mobility Overal bed mobility:  Needs Assistance             General bed mobility comments: pt long sitting in bed upon arrival, attempted pivoting to EOB and further mobility but pt declining, assisted in posterior scooting back into bed and with pillow placement for back pain, HOB elevated to allow upright posture in bed    Transfers Overall transfer level: Needs assistance                 General transfer comment: pt declining all attempts at OOB mobility today due to fatigue, pain, and nausea    Ambulation/Gait               General Gait Details: pt declining   Stairs             Wheelchair Mobility    Modified Rankin (Stroke Patients Only)       Balance Overall balance assessment: Needs assistance                                          Cognition Arousal/Alertness: Awake/alert Behavior During Therapy: Flat affect Overall Cognitive Status: No family/caregiver present to determine baseline cognitive functioning                                 General Comments: pt requiring encouragement and education on participation.        Exercises General Exercises - Upper Extremity Shoulder Flexion: AROM, Both, 5 reps, Seated (long sitting in bed) General Exercises - Lower Extremity Ankle Circles/Pumps: AROM, Both, 10 reps, Supine Heel Slides: AROM, Both, 10 reps, Supine Hip ABduction/ADduction:  AROM, Both, 10 reps, Supine Straight Leg Raises: AROM, Both, 10 reps, Supine    General Comments General comments (skin integrity, edema, etc.): VSS on room air, limited by nausea, RN notified and providing medication at the beginning of the session. Ronaldo [id number] interpreter      Pertinent Vitals/Pain Pain Assessment Pain Assessment: Faces Faces Pain Scale: Hurts even more Pain Location: R side of back Pain Descriptors / Indicators: Grimacing, Guarding Pain Intervention(s): Limited activity within patient's tolerance, Monitored during session,  Repositioned    Home Living                          Prior Function            PT Goals (current goals can now be found in the care plan section) Acute Rehab PT Goals Patient Stated Goal: feel better PT Goal Formulation: With patient/family Time For Goal Achievement: 06/04/23 Potential to Achieve Goals: Fair Progress towards PT goals: Progressing toward goals    Frequency    Min 3X/week      PT Plan Current plan remains appropriate    Co-evaluation              AM-PAC PT "6 Clicks" Mobility   Outcome Measure  Help needed turning from your back to your side while in a flat bed without using bedrails?: A Little Help needed moving from lying on your back to sitting on the side of a flat bed without using bedrails?: A Lot Help needed moving to and from a bed to a chair (including a wheelchair)?: A Lot Help needed standing up from a chair using your arms (e.g., wheelchair or bedside chair)?: A Lot Help needed to walk in hospital room?: Total Help needed climbing 3-5 steps with a railing? : Total 6 Click Score: 11    End of Session   Activity Tolerance: Patient limited by pain;Patient limited by fatigue Patient left: in bed;with call bell/phone within reach;with bed alarm set Nurse Communication: Mobility status PT Visit Diagnosis: Muscle weakness (generalized) (M62.81);Difficulty in walking, not elsewhere classified (R26.2);Other abnormalities of gait and mobility (R26.89);Pain Pain - Right/Left: Right Pain - part of body:  (RUQ)     Time: 1423-1450 PT Time Calculation (min) (ACUTE ONLY): 27 min  Charges:  $Therapeutic Exercise: 8-22 mins $Therapeutic Activity: 8-22 mins                     Lindalou Hose, PT DPT Acute Rehabilitation Services Office [phone]    Leonie Man 05/27/2023, 4:29 PM

## 2023-05-27 NOTE — Progress Notes (Signed)
PROGRESS NOTE        PATIENT DETAILS Name: Samantha [name] Age: 72 y.o. Sex: female Date of Birth: [birth year] Admit Date: 05/05/2023 Admitting Physician Meredeth Ide, MD [id number], Odette Horns, MD  Brief Summary: Patient is a 72 y.o.  female with history of HTN, DM-2, PAD, CVA, depression/anxiety-who presented with acute hypoxic respiratory failure in the setting of pulm edema with worsening renal function.  Unfortunately-thought to have progressed to ESRD-requiring HD, for the hospital course complicated by development of acute metabolic encephalopathy, and acute calculus cholecystitis.    Significant events: 5/08>> admit to [facility]  Significant studies: 5/08>> CT chest/abdomen/pelvis: New bilateral pleural effusions, right> left parenchymal opacities 5/08>> renal ultrasound: No hydronephrosis 5/09>>Echo: EF 55-60% 5/12>> CT chest: Increasing interstitial/patchy groundglass opacities-decreasing small bilateral pleural effusions. 5/14>> gastric emptying study: Nondiagnostic due to emesis 5/24>> HIDA scan: Non- visualization of cystic duct 5/25>> CT head: No acute intracranial abnormality. 5/25>> MRA brain: Intracranial atherosclerosis including severe left M2/right A2 stenosis, moderate left V4, basilar and PCA stenosis. 5/25>> MRI brain: Punctate acute left cerebellar infarct. 5/26>> LTM EEG: No seizures.  Significant microbiology data: 5/08>> COVID PCR: Negative 5/08>> blood culture: Negative 5/11>> pleural fluid culture: No growth 5/22>> blood culture: No growth  Procedures: 5/11>> thoracocentesis (pleural fluid cytology negative) 5/11>> nontunneled dialysis catheter by PCCM 5/20>> tunneled dialysis catheter by IR 5/26>> cholecystostomy tube by IR.  Consults: PCCM Nephrology Interventional radiology General surgery Neurology  Subjective: No major issues or night.  Lying comfortably in bed-awaiting insurance authorization for CIR at this  point.  Objective: Vitals: Blood pressure 111/68, pulse 95, temperature 98.1 F (36.7 C), temperature source Oral, resp. rate 15, height 5\' 5"  (1.651 m), weight 61.1 kg, SpO2 93 %.   Exam: Gen Exam:Alert awake-not in any distress HEENT:atraumatic, normocephalic Chest: B/L clear to auscultation anteriorly CVS:S1S2 regular Abdomen:soft non tender, non distended Extremities:no edema Neurology: Non focal Skin: no rash  Pertinent Labs/Radiology:    Latest Ref Rng & Units 05/27/2023    4:43 AM 05/25/2023    2:18 AM 05/23/2023    1:35 PM  CBC  WBC 4.0 - 10.5 K/uL 8.1  14.1  25.2   Hemoglobin 12.0 - 15.0 g/dL 40.9  81.1  91.4   Hematocrit 36.0 - 46.0 % 29.7  28.4  31.8   Platelets 150 - 400 K/uL 351  341  351     Lab Results  Component Value Date   NA 124 (L) 05/27/2023   K 4.1 05/27/2023   CL 84 (L) 05/27/2023   CO2 23 05/27/2023      Assessment/Plan: AKI on CKD stage IIIb Likely hemodynamically mediated-suspicion that she may have progressed to ESRD Nephrology following and directing HD scheduled/care. Continue to monitor for signs of renal recovery  Acute hypoxic respiratory failure Secondary to acute pulm edema in the setting of worsening renal function/pleural effusion.  (PNA ruled out) Volume being managed with HD Currently improved-and on room air  Acute calculus cholecystitis  Had low-grade fever on 5/22 and subsequently worsening leukocytosis/RUQ pain on 5/24-HIDA scan positive-evaluated by general surgery-Plavix held-underwent cholecystostomy tube placement by IR on 5/26 Initially on Zosyn-has been transitioned to Augmentin.    Acute metabolic encephalopathy Had significant worsening of mental status at the end of HD on 5/25-neuroimaging negative for CVA, LTM EEG negative for seizures.  Suspicion is for encephalopathy in the setting of  cholecystitis.  Thankfully after placement of cholecystostomy tube-she is significantly improved-and completely awake and  alert. MRI brain showed a punctate acute infarct-not clear if this is artifactual or a insignificant/incidental finding.  No further recommendations by neurology at this point.  Hyponatremia Due to impaired water excretion in the setting of worsening renal function Improving with ongoing volume removal with hemodialysis.  Hypokalemia Repleted  Acute on chronic HFrEF Acute pulm edema Volume status stable with HD  History of CAD No anginal symptoms Echo with stable EF Continue Plavix/beta-blocker/statin.  HTN BP stable on Coreg Follow/optimize  HLD Statin  DM-2 (A1c 7.7 on 4/18) CBGs stable on 15 units of Semglee daily, 3 units of NovoLog with meals and SSI  Recent Labs    05/26/23 1545 05/26/23 2030 05/27/23 0654  GLUCAP 105* 156* 110*     Subcentimeter left thyroid nodule Per radiology-not clinically significant-no follow-up recommendations.  BMI: Estimated body mass index is 22.42 kg/m as calculated from the following:   Height as of this encounter: 5\' 5"  (1.651 m).   Weight as of this encounter: 61.1 kg.   Code status:   Code Status: Full Code   DVT Prophylaxis: heparin injection 5,000 Units Start: 05/23/23 2200   Family Communication: None at bedside  Disposition Plan: Status is: Inpatient Remains inpatient appropriate because: Severity of illness   Planned Discharge Destination: Awaiting CIR bed.   Diet: Diet Order             Diet renal/carb modified with fluid restriction Diet-HS Snack? Nothing; Fluid restriction: 1200 mL Fluid; Room service appropriate? Yes; Fluid consistency: Thin  Diet effective now                     Antimicrobial agents: Anti-infectives (From admission, onward)    Start     Dose/Rate Route Frequency Ordered Stop   05/25/23 1600  amoxicillin-clavulanate (AUGMENTIN) 500-125 MG per tablet 1 tablet        1 tablet Oral 2 times daily 05/25/23 1151 05/29/23 0759   05/21/23 1830  piperacillin-tazobactam (ZOSYN)  IVPB 2.25 g  Status:  Discontinued        2.25 g 100 mL/hr over 30 Minutes Intravenous Every 8 hours 05/21/23 1740 05/25/23 1151   05/17/23 1500  ceFAZolin (ANCEF) IVPB 2g/100 mL premix        2 g 200 mL/hr over 30 Minutes Intravenous To Radiology 05/16/23 1204 05/17/23 1537   05/08/23 1000  ceFAZolin (ANCEF) IVPB 2g/100 mL premix        2 g 200 mL/hr over 30 Minutes Intravenous To Radiology 05/07/23 1537 05/09/23 1000   05/05/23 1515  cefTRIAXone (ROCEPHIN) 1 g in sodium chloride 0.9 % 100 mL IVPB        1 g 200 mL/hr over 30 Minutes Intravenous  Once 05/05/23 1510 05/05/23 1642   05/05/23 1515  azithromycin (ZITHROMAX) 500 mg in sodium chloride 0.9 % 250 mL IVPB        500 mg 250 mL/hr over 60 Minutes Intravenous  Once 05/05/23 1510 05/05/23 1750        MEDICATIONS: Scheduled Meds:  amoxicillin-clavulanate  1 tablet Oral BID   atorvastatin  80 mg Oral Daily   bisacodyl  10 mg Rectal Daily   carvedilol  6.25 mg Oral BID WC   Chlorhexidine Gluconate Cloth  6 each Topical Q0600   Chlorhexidine Gluconate Cloth  6 each Topical Q0600   clopidogrel  75 mg Oral Daily   diphenhydrAMINE  25  mg Intravenous Once   docusate sodium  100 mg Oral BID   fluticasone  2 spray Each Nare Daily   guaiFENesin  600 mg Oral BID   heparin injection (subcutaneous)  5,000 Units Subcutaneous Q8H   insulin aspart  0-15 Units Subcutaneous TID WC   insulin aspart  0-5 Units Subcutaneous QHS   insulin aspart  3 Units Subcutaneous TID WC   insulin glargine-yfgn  15 Units Subcutaneous Daily   oxymetazoline  1 spray Each Nare Once   polyethylene glycol  17 g Oral BID   sevelamer carbonate  1,600 mg Oral TID WC   sodium chloride flush  3 mL Intravenous Q12H   Continuous Infusions:  sodium chloride Stopped (05/09/23 2241)   anticoagulant sodium citrate     ferric gluconate (FERRLECIT) IVPB 250 mg (05/27/23 0847)   PRN Meds:.sodium chloride, acetaminophen, albuterol, alteplase, anticoagulant sodium  citrate, heparin, heparin, HYDROmorphone (DILAUDID) injection, iohexol, LORazepam, melatonin, ondansetron **OR** ondansetron (ZOFRAN) IV, mouth rinse, oxyCODONE, sodium chloride, sodium chloride flush   I have personally reviewed following labs and imaging studies  LABORATORY DATA: CBC: Recent Labs  Lab 05/22/23 0308 05/23/23 0354 05/23/23 1335 05/25/23 0218 05/27/23 0443  WBC 20.7* 23.3* 25.2* 14.1* 8.1  NEUTROABS  --   --  22.3*  --   --   HGB 10.7* 11.0* 10.8* 10.0* 10.4*  HCT 31.1* 32.2* 31.8* 28.4* 29.7*  MCV 91.5 94.7 91.6 89.6 91.1  PLT 322 340 351 341 351     Basic Metabolic Panel: Recent Labs  Lab 05/22/23 0309 05/23/23 0354 05/24/23 0335 05/25/23 0218 05/27/23 0443  NA 131* 133* 134* 128* 124*  K 4.1 3.8 4.2 3.6 4.1  CL 92* 91* 91* 85* 84*  CO2 21* 21* 20* 20* 23  GLUCOSE 182* 237* 243* 117* 104*  BUN 60* 46* 79* 95* 58*  CREATININE 5.87* 5.21* 7.32* 8.39* 6.48*  CALCIUM 9.3 9.1 9.2 8.1*  9.0 8.8*  PHOS 7.9* 7.0* 8.4* 7.9* 6.2*     GFR: Estimated Creatinine Clearance: 7.4 mL/min (A) (by C-G formula based on SCr of 6.48 mg/dL (H)).  Liver Function Tests: Recent Labs  Lab 05/21/23 0405 05/22/23 0308 05/22/23 0309 05/23/23 0354 05/24/23 0335 05/25/23 0218 05/27/23 0443  AST 39 33  --  68*  --   --   --   ALT 18 16  --  24  --   --   --   ALKPHOS 91 100  --  153*  --   --   --   BILITOT 1.1 1.0  --  1.0  --   --   --   PROT 7.1 7.5  --  7.7  --   --   --   ALBUMIN 3.0* 2.7* 2.8* 2.8* 2.5* 2.1* 2.3*    No results for input(s): "LIPASE", "AMYLASE" in the last 168 hours. Recent Labs  Lab 05/22/23 1621  AMMONIA 15     Coagulation Profile: No results for input(s): "INR", "PROTIME" in the last 168 hours.   Cardiac Enzymes: No results for input(s): "CKTOTAL", "CKMB", "CKMBINDEX", "TROPONINI" in the last 168 hours.  BNP (last 3 results) No results for input(s): "PROBNP" in the last 8760 hours.  Lipid Profile: No results for input(s):  "CHOL", "HDL", "LDLCALC", "TRIG", "CHOLHDL", "LDLDIRECT" in the last 72 hours.  Thyroid Function Tests: No results for input(s): "TSH", "T4TOTAL", "FREET4", "T3FREE", "THYROIDAB" in the last 72 hours.  Anemia Panel: Recent Labs    05/25/23 0218  FERRITIN 632*  TIBC 172*  IRON 13*     Urine analysis:    Component Value Date/Time   COLORURINE YELLOW 05/05/2023 0927   APPEARANCEUR CLEAR 05/05/2023 0927   LABSPEC 1.010 05/05/2023 0927   PHURINE 6.0 05/05/2023 0927   GLUCOSEU 50 (A) 05/05/2023 0927   HGBUR NEGATIVE 05/05/2023 0927   BILIRUBINUR NEGATIVE 05/05/2023 0927   BILIRUBINUR negative 08/18/2021 1207   BILIRUBINUR neg 06/28/2017 1508   KETONESUR NEGATIVE 05/05/2023 0927   PROTEINUR 100 (A) 05/05/2023 0927   UROBILINOGEN 0.2 08/18/2021 1207   UROBILINOGEN 0.2 01/15/2015 1032   NITRITE NEGATIVE 05/05/2023 0927   LEUKOCYTESUR NEGATIVE 05/05/2023 0927    Sepsis Labs: Lactic Acid, Venous    Component Value Date/Time   LATICACIDVEN 2.22 (H) 01/10/2015 1339    MICROBIOLOGY: Recent Results (from the past 240 hour(s))  Culture, blood (Routine X 2) w Reflex to ID Panel     Status: None   Collection Time: 05/19/23  6:06 PM   Specimen: BLOOD LEFT ARM  Result Value Ref Range Status   Specimen Description BLOOD LEFT ARM  Final   Special Requests   Final    BOTTLES DRAWN AEROBIC AND ANAEROBIC Blood Culture adequate volume   Culture   Final    NO GROWTH 5 DAYS Performed at [facility], [address], [place], [place] [address]    Report Status 05/24/2023 FINAL  Final  Culture, blood (Routine X 2) w Reflex to ID Panel     Status: None   Collection Time: 05/19/23  6:06 PM   Specimen: BLOOD LEFT ARM  Result Value Ref Range Status   Specimen Description BLOOD LEFT ARM  Final   Special Requests   Final    BOTTLES DRAWN AEROBIC AND ANAEROBIC Blood Culture adequate volume   Culture   Final    NO GROWTH 5 DAYS Performed at [facility], [address]  [place], [place] [address]    Report Status 05/24/2023 FINAL  Final  Aerobic/Anaerobic Culture w Gram Stain (surgical/deep wound)     Status: None (Preliminary result)   Collection Time: 05/23/23 11:27 AM   Specimen: BILE  Result Value Ref Range Status   Specimen Description BILE  Final   Special Requests NONE  Final   Gram Stain NO WBC SEEN NO ORGANISMS SEEN   Final   Culture   Final    NO GROWTH 4 DAYS NO ANAEROBES ISOLATED; CULTURE IN PROGRESS FOR 5 DAYS Performed at [facility], [address], [place], [place] [address]    Report Status PENDING  Incomplete    RADIOLOGY STUDIES/RESULTS: No results found.   LOS: 22 days   Jeoffrey Massed, MD  [facility]    To contact the attending provider between 7A-7P or the covering provider during after hours 7P-7A, please log into the web site [email or web address] and access using universal [place] password for that web site. If you do not have the password, please call the hospital operator.  05/27/2023, 11:51 AM

## 2023-05-27 NOTE — Procedures (Signed)
Patient was seen on dialysis and the procedure was supervised.  BFR 350  Via TDC BP is  136/69.   Patient appears to be tolerating treatment well- had one BP drop-  think have achieved EDW  Cecille Aver 05/27/2023

## 2023-05-27 NOTE — Progress Notes (Signed)
POST HD TX NOTE  05/27/23 1201  Vitals  Temp 98 F (36.7 C)  Temp Source Oral  BP 139/69  MAP (mmHg) 90  BP Location Left Arm  BP Method Automatic  Patient Position (if appropriate) Lying  Pulse Rate 87  Pulse Rate Source Monitor  ECG Heart Rate 87  Resp 17  Oxygen Therapy  SpO2 97 %  O2 Device Room Air  Pulse Oximetry Type Continuous  During Treatment Monitoring  Intra-Hemodialysis Comments (S)   (post HD tx VS check)  Post Treatment  Dialyzer Clearance Lightly streaked  Duration of HD Treatment -hour(s) 3.25 hour(s) (3hr 16 min)  Hemodialysis Intake (mL) 470 mL (NS boluses + Ferric Gluconate )  Liters Processed 67.2  Fluid Removed (mL) 930 mL ( (value from machine) - (Ferric Gluconate)= )  Tolerated HD Treatment Yes  Post-Hemodialysis Comments (S)  tx completed w/ access/clotting issues throughout tx, continually beeping throughout tx, possilby positional when pt turned head toward cath it would spike to -490 and only come down if deaccessed and flushed w/ NS syringe, toward end of tx we limped through tx as it was apparently trying to clot off somewhere despite multiple extra flushes, ended 14 min early d/t clotting issues, UF goal not met, blood rinsed back, VSS. Medication Admin: Heparin 1200 units, Ferric gluconate 250mg  IVPB, Heparin Dwells 3200 units  Hemodialysis Catheter Right Internal jugular Double lumen Permanent (Tunneled)  Placement Date/Time: 05/17/23 1541   Serial / Lot #: [id number]  Expiration Date: 10/28/27  Time Out: Correct patient;Correct site;Correct procedure  Maximum sterile barrier precautions: Hand hygiene;Cap;Mask;Sterile gown;Sterile gloves;Large sterile s...  Blue Lumen Status Heparin locked;Dead end cap in place  Red Lumen Status Heparin locked;Dead end cap in place  Purple Lumen Status N/A  Catheter fill solution Heparin 1000 units/ml  Catheter fill volume (Arterial) 1.6 cc  Catheter fill volume (Venous) 1.6   Dressing Type Transparent  Dressing Status Antimicrobial disc in place;Clean, Dry, Intact  Drainage Description None  Dressing Change Due 06/03/23  Post treatment catheter status Capped and Clamped

## 2023-05-27 NOTE — Progress Notes (Signed)
Pt's case discussed with Meriam Sprague  at [facility] out-pt HD services. Meriam Sprague is working on pt's case being reviewed by a provider for possible acceptance. Will assist as needed.   Olivia Canter Renal Navigator [phone]

## 2023-05-27 NOTE — Plan of Care (Signed)

## 2023-05-27 NOTE — Progress Notes (Signed)
PT Cancellation Note  Patient Details Name: Samantha [name] MRN: [id number] DOB: August 12, 1951   Cancelled Treatment:    Reason Eval/Treat Not Completed: Patient at procedure or test/unavailable. Per chart review, pt is currently off unit at dialysis. PT will hold session and follow up as appropriate/available. Thank you.    Leonie Man 05/27/2023, 8:27 AM

## 2023-05-27 NOTE — Progress Notes (Signed)
[facility] NEPHROLOGY PROGRESS NOTE  Assessment/ Plan: Pt is a 72 y.o. yo female with a past medical history significant for hypertension, type II DM, PAD, CVA, HLD, CKD 3B who presented with shortness of breath and fluid overload.  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  OP HD was arranged but now is saying that [facility] wont accept so need to look for another unit. Plan for PD later on it seems but will need to wait until cholecystitis is better. If not going to pursue PD will need AV access down the line HD today, to continue TTS schedule via [facility]  # Acute hypoxic respiratory failure due to fluid overload: Received thoracocentesis and diuresis.  Resolved. Now managing volume with HD.  # HTN/volume: BP acceptable.  No edema.  UF as tolerated.  Discontinue Lasix. Only on low dose coreg.  I think we have achieved EDW   # Anemia: Hemoglobin at goal-  no treatment needed as of yet but getting close-  iron low, will replete  # Acute cholecystitis: HIDA scan was positive therefore on antibiotics.  Seen by general surgery and IR.  S/p cholecystostomy tube placement.  # Acute metabolic encephalopathy - resolved  Bones-  phos up-  calc WNL-  added renvela.  PTH 106-  no meds   Subjective:  seen on HD-    No new event.  She is alert awake.   Family tells me they are trying to get her into inpatient rehab- this is still pending  Objective Vital signs in last 24 hours: Vitals:   05/27/23 0749 05/27/23 0750 05/27/23 0808 05/27/23 0830  BP: 138/85  (!) 142/76 (!) 107/91  Pulse: 75  77 79  Resp: 13  14 18   Temp: 98.1 F (36.7 C) 98.1 F (36.7 C)    TempSrc:  Oral    SpO2: 93%  94% 95%  Weight: 61.1 kg 61.1 kg    Height:       Weight change: 4.5 kg  Intake/Output Summary (Last 24 hours) at 05/27/2023 0855 Last data filed at 05/27/2023 0500 Gross per  24 hour  Intake 274.97 ml  Output 300 ml  Net -25.03 ml       Labs: RENAL PANEL Recent Labs  Lab 05/22/23 0309 05/23/23 0354 05/24/23 0335 05/25/23 0218 05/27/23 0443  NA 131* 133* 134* 128* 124*  K 4.1 3.8 4.2 3.6 4.1  CL 92* 91* 91* 85* 84*  CO2 21* 21* 20* 20* 23  GLUCOSE 182* 237* 243* 117* 104*  BUN 60* 46* 79* 95* 58*  CREATININE 5.87* 5.21* 7.32* 8.39* 6.48*  CALCIUM 9.3 9.1 9.2 8.1*  9.0 8.8*  PHOS 7.9* 7.0* 8.4* 7.9* 6.2*  ALBUMIN 2.8* 2.8* 2.5* 2.1* 2.3*    Liver Function Tests: Recent Labs  Lab 05/21/23 0405 05/22/23 0308 05/22/23 0309 05/23/23 0354 05/24/23 0335 05/25/23 0218 05/27/23 0443  AST 39 33  --  68*  --   --   --   ALT 18 16  --  24  --   --   --   ALKPHOS 91 100  --  153*  --   --   --   BILITOT 1.1 1.0  --  1.0  --   --   --   PROT 7.1 7.5  --  7.7  --   --   --   ALBUMIN  3.0* 2.7*   < > 2.8* 2.5* 2.1* 2.3*   < > = values in this interval not displayed.   No results for input(s): "LIPASE", "AMYLASE" in the last 168 hours. Recent Labs  Lab 05/22/23 1621  AMMONIA 15   CBC: Recent Labs    05/22/23 0308 05/23/23 0354 05/23/23 1335 05/25/23 0218 05/27/23 0443  HGB 10.7* 11.0* 10.8* 10.0* 10.4*  MCV 91.5 94.7 91.6 89.6 91.1  FERRITIN  --   --   --  632*  --   TIBC  --   --   --  172*  --   IRON  --   --   --  13*  --     Cardiac Enzymes: No results for input(s): "CKTOTAL", "CKMB", "CKMBINDEX", "TROPONINI" in the last 168 hours. CBG: Recent Labs  Lab 05/26/23 0812 05/26/23 1146 05/26/23 1545 05/26/23 2030 05/27/23 0654  GLUCAP 148* 193* 105* 156* 110*    Iron Studies:  Recent Labs    05/25/23 0218  IRON 13*  TIBC 172*  FERRITIN 632*   Studies/Results: No results found.  Medications: Infusions:  sodium chloride Stopped (05/09/23 2241)   anticoagulant sodium citrate     ferric gluconate (FERRLECIT) IVPB 250 mg (05/27/23 0847)    Scheduled Medications:  amoxicillin-clavulanate  1 tablet Oral BID    atorvastatin  80 mg Oral Daily   bisacodyl  10 mg Rectal Daily   carvedilol  6.25 mg Oral BID WC   Chlorhexidine Gluconate Cloth  6 each Topical Q0600   Chlorhexidine Gluconate Cloth  6 each Topical Q0600   clopidogrel  75 mg Oral Daily   diphenhydrAMINE  25 mg Intravenous Once   docusate sodium  100 mg Oral BID   fluticasone  2 spray Each Nare Daily   guaiFENesin  600 mg Oral BID   heparin injection (subcutaneous)  5,000 Units Subcutaneous Q8H   insulin aspart  0-15 Units Subcutaneous TID WC   insulin aspart  0-5 Units Subcutaneous QHS   insulin aspart  3 Units Subcutaneous TID WC   insulin glargine-yfgn  15 Units Subcutaneous Daily   oxymetazoline  1 spray Each Nare Once   polyethylene glycol  17 g Oral BID   sevelamer carbonate  1,600 mg Oral TID WC   sodium chloride flush  3 mL Intravenous Q12H    have reviewed scheduled and prn medications.  Physical Exam: General:NAD, comfortable Heart:RRR, s1s2 nl Lungs:clear b/l, no crackle Abdomen:soft, mild tenderness, not distended. Extremities:No edema Dialysis Access: TDC  Taiwan Talcott A Hang Ammon 05/27/2023,8:55 AM  LOS: 22 days

## 2023-05-27 NOTE — TOC Progression Note (Signed)
Transition of Care [facility]) - Progression Note    Patient Details  Name: Samantha [name] MRN: [id number] Date of Birth: [birth year]  [facility] CM/SW Contact  Mearl Latin, LCSW Phone Number: 05/27/2023, 11:07 AM  Clinical Narrative:    CSW made patient's daughter aware that requested letter was placed on patient's windowsill in her room (patient in dialysis).    Expected Discharge Plan: IP Rehab Facility Barriers to Discharge: English as a second language teacher (HD [employer])  Expected Discharge Plan and Services   Discharge Planning Services: CM Consult   Living arrangements for the past 2 months: Single Family Home                                       Social Determinants of Health (SDOH) Interventions SDOH Screenings   Food Insecurity: No Food Insecurity (05/05/2023)  Housing: Low Risk  (05/05/2023)  Transportation Needs: No Transportation Needs (05/05/2023)  Utilities: Not At Risk (05/05/2023)  Depression (PHQ2-9): Low Risk  (11/19/2021)  Tobacco Use: Low Risk  (05/25/2023)    Readmission Risk Interventions     No data to display

## 2023-05-28 ENCOUNTER — Other Ambulatory Visit: Payer: Self-pay

## 2023-05-28 ENCOUNTER — Inpatient Hospital Stay (HOSPITAL_COMMUNITY)
Admission: RE | Admit: 2023-05-28 | Discharge: 2023-06-07 | DRG: 945 | Disposition: A | Discharge status: Home / Self Care | Payer: Medicaid Other | Source: Intra-hospital | Attending: Physical Medicine & Rehabilitation | Admitting: Physical Medicine & Rehabilitation

## 2023-05-28 ENCOUNTER — Encounter (HOSPITAL_COMMUNITY): Payer: Self-pay | Admitting: Physical Medicine & Rehabilitation

## 2023-05-28 DIAGNOSIS — R1013 Epigastric pain: Secondary | ICD-10-CM | POA: Diagnosis not present

## 2023-05-28 DIAGNOSIS — R140 Abdominal distension (gaseous): Secondary | ICD-10-CM | POA: Diagnosis not present

## 2023-05-28 DIAGNOSIS — E871 Hypo-osmolality and hyponatremia: Secondary | ICD-10-CM | POA: Diagnosis present

## 2023-05-28 DIAGNOSIS — E785 Hyperlipidemia, unspecified: Secondary | ICD-10-CM | POA: Diagnosis present

## 2023-05-28 DIAGNOSIS — E1122 Type 2 diabetes mellitus with diabetic chronic kidney disease: Secondary | ICD-10-CM | POA: Diagnosis present

## 2023-05-28 DIAGNOSIS — E119 Type 2 diabetes mellitus without complications: Secondary | ICD-10-CM

## 2023-05-28 DIAGNOSIS — I951 Orthostatic hypotension: Secondary | ICD-10-CM | POA: Diagnosis not present

## 2023-05-28 DIAGNOSIS — Z7902 Long term (current) use of antithrombotics/antiplatelets: Secondary | ICD-10-CM | POA: Diagnosis not present

## 2023-05-28 DIAGNOSIS — E1151 Type 2 diabetes mellitus with diabetic peripheral angiopathy without gangrene: Secondary | ICD-10-CM | POA: Diagnosis present

## 2023-05-28 DIAGNOSIS — I10 Essential (primary) hypertension: Secondary | ICD-10-CM | POA: Diagnosis not present

## 2023-05-28 DIAGNOSIS — N186 End stage renal disease: Secondary | ICD-10-CM | POA: Diagnosis present

## 2023-05-28 DIAGNOSIS — G9341 Metabolic encephalopathy: Secondary | ICD-10-CM | POA: Diagnosis present

## 2023-05-28 DIAGNOSIS — D631 Anemia in chronic kidney disease: Secondary | ICD-10-CM | POA: Diagnosis present

## 2023-05-28 DIAGNOSIS — M898X9 Other specified disorders of bone, unspecified site: Secondary | ICD-10-CM | POA: Diagnosis present

## 2023-05-28 DIAGNOSIS — I5033 Acute on chronic diastolic (congestive) heart failure: Secondary | ICD-10-CM | POA: Diagnosis present

## 2023-05-28 DIAGNOSIS — Z8673 Personal history of transient ischemic attack (TIA), and cerebral infarction without residual deficits: Secondary | ICD-10-CM | POA: Diagnosis not present

## 2023-05-28 DIAGNOSIS — Z992 Dependence on renal dialysis: Secondary | ICD-10-CM

## 2023-05-28 DIAGNOSIS — K5900 Constipation, unspecified: Secondary | ICD-10-CM | POA: Diagnosis present

## 2023-05-28 DIAGNOSIS — K8012 Calculus of gallbladder with acute and chronic cholecystitis without obstruction: Secondary | ICD-10-CM | POA: Diagnosis present

## 2023-05-28 DIAGNOSIS — E876 Hypokalemia: Secondary | ICD-10-CM | POA: Diagnosis present

## 2023-05-28 DIAGNOSIS — Z603 Acculturation difficulty: Secondary | ICD-10-CM | POA: Diagnosis present

## 2023-05-28 DIAGNOSIS — E1149 Type 2 diabetes mellitus with other diabetic neurological complication: Secondary | ICD-10-CM

## 2023-05-28 DIAGNOSIS — I132 Hypertensive heart and chronic kidney disease with heart failure and with stage 5 chronic kidney disease, or end stage renal disease: Secondary | ICD-10-CM | POA: Diagnosis present

## 2023-05-28 DIAGNOSIS — Z794 Long term (current) use of insulin: Secondary | ICD-10-CM

## 2023-05-28 DIAGNOSIS — K5901 Slow transit constipation: Secondary | ICD-10-CM | POA: Diagnosis not present

## 2023-05-28 DIAGNOSIS — R5381 Other malaise: Secondary | ICD-10-CM | POA: Diagnosis present

## 2023-05-28 DIAGNOSIS — Z7984 Long term (current) use of oral hypoglycemic drugs: Secondary | ICD-10-CM | POA: Diagnosis not present

## 2023-05-28 DIAGNOSIS — I2510 Atherosclerotic heart disease of native coronary artery without angina pectoris: Secondary | ICD-10-CM | POA: Diagnosis present

## 2023-05-28 DIAGNOSIS — Z79899 Other long term (current) drug therapy: Secondary | ICD-10-CM

## 2023-05-28 DIAGNOSIS — I5032 Chronic diastolic (congestive) heart failure: Secondary | ICD-10-CM

## 2023-05-28 DIAGNOSIS — Z8719 Personal history of other diseases of the digestive system: Secondary | ICD-10-CM

## 2023-05-28 DIAGNOSIS — I63442 Cerebral infarction due to embolism of left cerebellar artery: Secondary | ICD-10-CM | POA: Diagnosis present

## 2023-05-28 LAB — CBC
HCT: 29.5 % — ABNORMAL LOW (ref 36.0–46.0)
Hemoglobin: 10.2 g/dL — ABNORMAL LOW (ref 12.0–15.0)
MCH: 31.8 pg (ref 26.0–34.0)
MCHC: 34.6 g/dL (ref 30.0–36.0)
MCV: 91.9 fL (ref 80.0–100.0)
Platelets: 328 10*3/uL (ref 150–400)
RBC: 3.21 MIL/uL — ABNORMAL LOW (ref 3.87–5.11)
RDW: 12.3 % (ref 11.5–15.5)
WBC: 8.1 10*3/uL (ref 4.0–10.5)
nRBC: 0 % (ref 0.0–0.2)

## 2023-05-28 LAB — RENAL FUNCTION PANEL
Albumin: 2.4 g/dL — ABNORMAL LOW (ref 3.5–5.0)
Anion gap: 15 (ref 5–15)
BUN: 33 mg/dL — ABNORMAL HIGH (ref 8–23)
CO2: 25 mmol/L (ref 22–32)
Calcium: 8.4 mg/dL — ABNORMAL LOW (ref 8.9–10.3)
Chloride: 85 mmol/L — ABNORMAL LOW (ref 98–111)
Creatinine, Ser: 4.71 mg/dL — ABNORMAL HIGH (ref 0.44–1.00)
GFR, Estimated: 9 mL/min — ABNORMAL LOW (ref >60)
Glucose, Bld: 106 mg/dL — ABNORMAL HIGH (ref 70–99)
Phosphorus: 3.8 mg/dL (ref 2.5–4.6)
Potassium: 3.5 mmol/L (ref 3.5–5.1)
Sodium: 125 mmol/L — ABNORMAL LOW (ref 135–145)

## 2023-05-28 LAB — GLUCOSE, CAPILLARY
Glucose-Capillary: 145 mg/dL — ABNORMAL HIGH (ref 70–99)
Glucose-Capillary: 107 mg/dL — ABNORMAL HIGH (ref 70–99)
Glucose-Capillary: 120 mg/dL — ABNORMAL HIGH (ref 70–99)
Glucose-Capillary: 239 mg/dL — ABNORMAL HIGH (ref 70–99)

## 2023-05-28 LAB — HEPATITIS B SURFACE ANTIGEN: Hepatitis B Surface Ag: NONREACTIVE

## 2023-05-28 LAB — AEROBIC/ANAEROBIC CULTURE W GRAM STAIN (SURGICAL/DEEP WOUND): Culture: NO GROWTH

## 2023-05-28 MED ORDER — TRAZODONE HCL 50 MG PO TABS: 50.0000 mg | ORAL_TABLET | Freq: Every evening | ORAL | Status: DC | PRN

## 2023-05-28 MED ORDER — MELATONIN 3 MG PO TABS
3.0000 mg | ORAL_TABLET | Freq: Every evening | ORAL | Status: DC | PRN
  Administered 2023-05-29: 3 mg via ORAL
  Filled 2023-05-28: qty 1

## 2023-05-28 MED ORDER — SEVELAMER CARBONATE 800 MG PO TABS
1600.0000 mg | ORAL_TABLET | Freq: Three times a day (TID) | ORAL | Status: DC
  Administered 2023-05-28 – 2023-05-29 (×2): 1600 mg via ORAL
  Filled 2023-05-28 (×3): qty 2

## 2023-05-28 MED ORDER — INSULIN ASPART 100 UNIT/ML IJ SOLN: 3.0000 [IU] | Freq: Three times a day (TID) | INTRAMUSCULAR | 11 refills | Status: DC

## 2023-05-28 MED ORDER — METHOCARBAMOL 500 MG PO TABS
500.0000 mg | ORAL_TABLET | Freq: Four times a day (QID) | ORAL | Status: DC | PRN
  Administered 2023-05-31 – 2023-06-02 (×3): 500 mg via ORAL
  Filled 2023-05-28 (×3): qty 1

## 2023-05-28 MED ORDER — SODIUM CHLORIDE 0.9 % IV SOLN: 250.0000 mL | INTRAVENOUS | Status: DC | PRN

## 2023-05-28 MED ORDER — HEPARIN SODIUM (PORCINE) 5000 UNIT/ML IJ SOLN
5000.0000 [IU] | Freq: Three times a day (TID) | INTRAMUSCULAR | Status: DC
Start: 2023-05-28 — End: 2023-05-28

## 2023-05-28 MED ORDER — ACETAMINOPHEN 325 MG PO TABS
325.0000 mg | ORAL_TABLET | ORAL | Status: DC | PRN
  Administered 2023-06-01 – 2023-06-04 (×2): 650 mg via ORAL
  Filled 2023-05-28 (×6): qty 2

## 2023-05-28 MED ORDER — INSULIN GLARGINE-YFGN 100 UNIT/ML ~~LOC~~ SOLN: 15.0000 [IU] | Freq: Every day | SUBCUTANEOUS | 11 refills | Status: DC

## 2023-05-28 MED ORDER — AMOXICILLIN-POT CLAVULANATE 500-125 MG PO TABS
1.0000 | ORAL_TABLET | Freq: Two times a day (BID) | ORAL | Status: AC
  Administered 2023-05-28: 1 via ORAL
  Filled 2023-05-28 (×2): qty 1

## 2023-05-28 MED ORDER — POLYETHYLENE GLYCOL 3350 17 G PO PACK
17.0000 g | PACK | Freq: Two times a day (BID) | ORAL | Status: DC
  Administered 2023-05-28 – 2023-06-06 (×18): 17 g via ORAL
  Filled 2023-05-28 (×18): qty 1

## 2023-05-28 MED ORDER — INSULIN ASPART 100 UNIT/ML IJ SOLN
0.0000 [IU] | Freq: Three times a day (TID) | INTRAMUSCULAR | Status: DC
  Administered 2023-05-29: 3 [IU] via SUBCUTANEOUS
  Administered 2023-05-29: 2 [IU] via SUBCUTANEOUS
  Administered 2023-05-30 (×2): 3 [IU] via SUBCUTANEOUS
  Administered 2023-05-30: 2 [IU] via SUBCUTANEOUS
  Administered 2023-05-31: 3 [IU] via SUBCUTANEOUS
  Administered 2023-05-31: 2 [IU] via SUBCUTANEOUS
  Administered 2023-06-01: 3 [IU] via SUBCUTANEOUS
  Administered 2023-06-01: 5 [IU] via SUBCUTANEOUS
  Administered 2023-06-01: 3 [IU] via SUBCUTANEOUS
  Administered 2023-06-02: 2 [IU] via SUBCUTANEOUS
  Administered 2023-06-02 (×2): 3 [IU] via SUBCUTANEOUS
  Administered 2023-06-03: 5 [IU] via SUBCUTANEOUS
  Administered 2023-06-03: 8 [IU] via SUBCUTANEOUS
  Administered 2023-06-04: 3 [IU] via SUBCUTANEOUS
  Administered 2023-06-04: 2 [IU] via SUBCUTANEOUS
  Administered 2023-06-04: 3 [IU] via SUBCUTANEOUS
  Administered 2023-06-05: 5 [IU] via SUBCUTANEOUS
  Administered 2023-06-05: 2 [IU] via SUBCUTANEOUS
  Administered 2023-06-06 (×3): 3 [IU] via SUBCUTANEOUS
  Administered 2023-06-07: 2 [IU] via SUBCUTANEOUS

## 2023-05-28 MED ORDER — ONDANSETRON HCL 4 MG/2ML IJ SOLN: 4.0000 mg | Freq: Four times a day (QID) | INTRAMUSCULAR | Status: DC | PRN

## 2023-05-28 MED ORDER — BISACODYL 10 MG RE SUPP
10.0000 mg | Freq: Every day | RECTAL | Status: DC | PRN
  Administered 2023-05-28 – 2023-06-02 (×2): 10 mg via RECTAL
  Filled 2023-05-28 (×2): qty 1

## 2023-05-28 MED ORDER — CLOPIDOGREL BISULFATE 75 MG PO TABS
75.0000 mg | ORAL_TABLET | Freq: Every day | ORAL | Status: DC
  Administered 2023-05-29 – 2023-06-07 (×10): 75 mg via ORAL
  Filled 2023-05-28 (×10): qty 1

## 2023-05-28 MED ORDER — SEVELAMER CARBONATE 800 MG PO TABS: 1600.0000 mg | ORAL_TABLET | Freq: Three times a day (TID) | ORAL | Status: DC

## 2023-05-28 MED ORDER — CHLORHEXIDINE GLUCONATE CLOTH 2 % EX PADS: 6.0000 | MEDICATED_PAD | Freq: Every day | CUTANEOUS | Status: DC

## 2023-05-28 MED ORDER — AMOXICILLIN-POT CLAVULANATE 500-125 MG PO TABS: 1.0000 | ORAL_TABLET | Freq: Two times a day (BID) | ORAL | Status: DC

## 2023-05-28 MED ORDER — CHLORHEXIDINE GLUCONATE CLOTH 2 % EX PADS
6.0000 | MEDICATED_PAD | Freq: Every day | CUTANEOUS | Status: DC
  Administered 2023-05-29 – 2023-05-31 (×3): 6 via TOPICAL

## 2023-05-28 MED ORDER — HEPARIN SODIUM (PORCINE) 5000 UNIT/ML IJ SOLN
5000.0000 [IU] | Freq: Three times a day (TID) | INTRAMUSCULAR | Status: DC
  Administered 2023-05-28 – 2023-06-07 (×27): 5000 [IU] via SUBCUTANEOUS
  Filled 2023-05-28 (×28): qty 1

## 2023-05-28 MED ORDER — HEPARIN SODIUM (PORCINE) 1000 UNIT/ML DIALYSIS: 20.0000 [IU]/kg | INTRAMUSCULAR | Status: DC | PRN

## 2023-05-28 MED ORDER — CARVEDILOL 6.25 MG PO TABS
6.2500 mg | ORAL_TABLET | Freq: Two times a day (BID) | ORAL | Status: DC
Start: 2023-05-28 — End: 2023-06-07

## 2023-05-28 MED ORDER — INSULIN ASPART 100 UNIT/ML IJ SOLN
0.0000 [IU] | Freq: Every day | INTRAMUSCULAR | Status: DC
  Administered 2023-05-28 – 2023-05-29 (×2): 2 [IU] via SUBCUTANEOUS

## 2023-05-28 MED ORDER — INSULIN ASPART 100 UNIT/ML IJ SOLN
3.0000 [IU] | Freq: Three times a day (TID) | INTRAMUSCULAR | Status: DC
  Administered 2023-05-31 – 2023-06-06 (×11): 3 [IU] via SUBCUTANEOUS

## 2023-05-28 MED ORDER — SODIUM CHLORIDE 0.9 % IV SOLN
250.0000 mg | Freq: Every day | INTRAVENOUS | Status: AC
  Administered 2023-05-29: 250 mg via INTRAVENOUS
  Filled 2023-05-28: qty 20

## 2023-05-28 MED ORDER — ATORVASTATIN CALCIUM 80 MG PO TABS
80.0000 mg | ORAL_TABLET | Freq: Every day | ORAL | Status: DC
  Administered 2023-05-29 – 2023-06-07 (×10): 80 mg via ORAL
  Filled 2023-05-28 (×10): qty 1

## 2023-05-28 MED ORDER — NOVOLOG FLEXPEN 100 UNIT/ML ~~LOC~~ SOPN: PEN_INJECTOR | SUBCUTANEOUS | 0 refills | Status: DC

## 2023-05-28 MED ORDER — GUAIFENESIN ER 600 MG PO TB12
600.0000 mg | ORAL_TABLET | Freq: Two times a day (BID) | ORAL | Status: DC
  Administered 2023-05-28 – 2023-06-07 (×20): 600 mg via ORAL
  Filled 2023-05-28 (×20): qty 1

## 2023-05-28 MED ORDER — DOCUSATE SODIUM 100 MG PO CAPS
100.0000 mg | ORAL_CAPSULE | Freq: Two times a day (BID) | ORAL | Status: DC
  Administered 2023-05-28 – 2023-06-06 (×18): 100 mg via ORAL
  Filled 2023-05-28 (×18): qty 1

## 2023-05-28 MED ORDER — ORAL CARE MOUTH RINSE: 15.0000 mL | OROMUCOSAL | Status: DC | PRN

## 2023-05-28 MED ORDER — ONDANSETRON HCL 4 MG PO TABS
4.0000 mg | ORAL_TABLET | Freq: Four times a day (QID) | ORAL | Status: DC | PRN
  Administered 2023-05-28 – 2023-06-07 (×8): 4 mg via ORAL
  Filled 2023-05-28 (×8): qty 1

## 2023-05-28 MED ORDER — INSULIN GLARGINE-YFGN 100 UNIT/ML ~~LOC~~ SOLN
15.0000 [IU] | Freq: Every day | SUBCUTANEOUS | Status: DC
  Administered 2023-05-29 – 2023-06-07 (×10): 15 [IU] via SUBCUTANEOUS
  Filled 2023-05-28 (×10): qty 0.15

## 2023-05-28 MED ORDER — SODIUM CHLORIDE 0.9% FLUSH: 3.0000 mL | INTRAVENOUS | Status: DC | PRN

## 2023-05-28 MED ORDER — CARVEDILOL 6.25 MG PO TABS
6.2500 mg | ORAL_TABLET | Freq: Two times a day (BID) | ORAL | Status: DC
  Administered 2023-05-28 – 2023-06-07 (×18): 6.25 mg via ORAL
  Filled 2023-05-28 (×19): qty 1

## 2023-05-28 NOTE — H&P (Addendum)
Physical Medicine and Rehabilitation Admission H&P     CC: Functional deficits secondary to metabolic encephalopathy, respiratory and renal failure   HPI: Samantha [name] is a 72 year old female who presented to the ED at [facility] complaining of pain and SOB. CT chest abdomen/pelvis was done which showed pulmonary edema versus bibasilar infiltrates. Antibiotics started then discontinued due to low suspicion for pneumonia in setting due to BNP was elevated to 600, bilateral pulmonary effusions. Initially patient received IV fluid bolus LR 1 L. Oxygen requirement went up to 4.5 L/min. last echo from 2022 showed EF 65% with grade 1 diastolic dysfunction. Echocardiogram updated. RUQ ultrasound positive for gallstones. Nephrology consulted on 5/08. Increased WOB on 5/09 and required BiPAP.  She was not diuresing well despite high dose of Lasix per nephrology and was transferred to [facility] due to acute respiratory failure, acute on chronic diastolic heart failure. PCCM consulted on 5/11.  Dialysis catheter placed on 5/11 as well as bilateral thoracenteses. Started intermittent HD on 5/15. Her blood and pleural fluid cultures were negative. Due to ongoing need for HD a right IJ TDC was placed on 5/20. She had ongoing abdominal discomfort despite disimpaction and treatment of constipation. HIDA scan performed on 5/24 and general surgery consulted due to non-visualization. Plavix was held and she under percutaneous cholecystostomy tube placement on 5/26. Rapid response called on 5/25 due to increased lethargy after HD only responding to pain. CT, Mri of brain obtained and neurology consulted. CT-head-aspects 10.  No bleed.  Chronic basal ganglia and cerebellar infarcts. EEG obtained. MRI read as acute cerebellar infarct (not seen on review by neurologist), symptoms attributed to acute metabolic encephalopathy. Normal SaO2 on room air. BP stable. Poor PO intake. The patient requires inpatient medicine and  rehabilitation evaluations and services for ongoing dysfunction secondary to acute renal and respiratory failure.   She was recently hospitalized 4/17 through 4/21. Evaluation revealed temp of 100.4, hypertensive, mild leukocytosis and AKI with Cr to 2.88. CT chest with findings consistent with mild interstital lung disease. CT abd/pelvis with perinephric stranding symmetric and likely on a senescent basis. No hydronephrosis or ureteral stones. No other acute pathology. She was given IV fluid bolus and started on IV Rocephin and hospitalist was consulted for admission. Suspected sepsis secondary to UTI. History of DM-2 and hypoglycemic at home PTA. A1c 7.7%. Urine cultures for pansensitive E. coli. Antibiotics changed to cefadroxil. She also has a past medical history of CVA and is maintained on Plavix and statin. Nephrology consulted. Discharged on 04/18/2023 on Lasix 40 mg p.o. daily for 7 days.   Review of Systems  Constitutional:  Negative for chills and fever.  HENT:  Negative for congestion and sore throat.   Eyes:  Negative for blurred vision and double vision.  Respiratory:  Negative for cough and shortness of breath.   Cardiovascular:  Negative for chest pain and palpitations.  Gastrointestinal:  Positive for constipation and nausea. Negative for vomiting.       Decreased appetite  Genitourinary:  Negative for dysuria and urgency.  Musculoskeletal:  Negative for back pain and neck pain.  Neurological:  Negative for dizziness and headaches.  Psychiatric/Behavioral:  The patient is not nervous/anxious and does not have insomnia.         Past Medical History:  Diagnosis Date   Acute pyelonephritis     Bacteremia, escherichia coli 01/12/2015   Chest pain     Critical lower limb ischemia (HCC)     Hyperlipidemia  Hypertension     Left carotid bruit     PAD (peripheral artery disease) (HCC)     Type II diabetes mellitus (HCC)           Past Surgical History:  Procedure Laterality  Date   ABDOMINAL ANGIOGRAM   05/16/2015    Procedure: Abdominal Angiogram;  Surgeon: Runell Gess, MD;  Location: [facility] INVASIVE CV LAB;  Service: Cardiovascular;;   ABDOMINAL HYSTERECTOMY   ~ 2000   IR FLUORO GUIDE CV LINE RIGHT   05/17/2023   IR PERC CHOLECYSTOSTOMY   05/23/2023   IR US GUIDE VASC ACCESS RIGHT   05/17/2023   PERIPHERAL VASCULAR CATHETERIZATION Bilateral 05/16/2015    Procedure: Lower Extremity Angiography;  Surgeon: Runell Gess, MD; renal arteries widely patent, L-SFA 75%, L-pop 95%, L-peroneal 90%; R-SFA 40%, R-pop 50%, R-prox peroneal 90%; directional atherectomy L-SFA, p-pop, drug-eluting balloon angioplasty reducing the stenoses to 0, small linear dissection was not flow limiting       PERIPHERAL VASCULAR CATHETERIZATION Left 05/16/2015    Procedure: Peripheral Vascular Atherectomy;  Surgeon: Runell Gess, MD;  Location: [facility] INVASIVE CV LAB;  Service: Cardiovascular;  Laterality: Left;  sfa         Family History  Problem Relation Age of Onset   Hypertension Mother      Social History:  reports that she has never smoked. She has never used smokeless tobacco. She reports current alcohol use of about 1.0 - 2.0 standard drink of alcohol per week. She reports that she does not use drugs. Allergies:       Allergies  Allergen Reactions   Chicken Allergy Other (See Comments)      headache   Perflutren Other (See Comments)      Pt with back pain as soon as administered.- patient refuted this in 2024          Medications Prior to Admission  Medication Sig Dispense Refill   amLODipine (NORVASC) 10 MG tablet Take 1 tablet (10 mg total) by mouth daily. 90 tablet 1   atorvastatin (LIPITOR) 80 MG tablet Take 1 tablet (80 mg total) by mouth daily. 90 tablet 1   cephALEXin (KEFLEX) 500 MG capsule Take 500 mg by mouth 2 (two) times daily.       clopidogrel (PLAVIX) 75 MG tablet Take 1 tablet (75 mg total) by mouth daily. 90 tablet 1   diltiazem (CARDIZEM SR) 120 MG 12 hr  capsule Take 120 mg by mouth daily.       furosemide (LASIX) 40 MG tablet Take 1 tablet (40 mg total) by mouth daily for 7 days. 7 tablet 0   glimepiride (AMARYL) 4 MG tablet Take 1 tablet (4 mg total) by mouth daily with breakfast. 90 tablet 1   hydrALAZINE (APRESOLINE) 50 MG tablet Take 1 tablet (50 mg total) by mouth every 8 (eight) hours. 90 tablet 1   sodium bicarbonate 650 MG tablet Take 1 tablet (650 mg total) by mouth 3 (three) times daily. 90 tablet 0   glucose blood test strip Use 3 times daily before meals 100 each 12   insulin isophane & regular human KwikPen (HUMULIN 70/30 KWIKPEN) (70-30) 100 UNIT/ML KwikPen Inject 30 Units into the skin in the morning AND 25 Units every evening. (Patient taking differently: Inject 30 Units into the skin in the morning AND 26 Units every evening.) 90 mL 6   Insulin Syringe-Needle U-100 (BD INSULIN SYRINGE ULTRAFINE) 31G X 15/64" 0.5 ML MISC Use subcutaneously  twice daily 60 each 12   TRUEPLUS LANCETS 28G MISC 1 each by Does not apply route 3 (three) times daily before meals. 100 each 12          Home: Home Living Family/patient expects to be discharged to:: Private residence Living Arrangements: Children Available Help at Discharge: Family, Available 24 hours/day Type of Home: House Home Access: Stairs to enter Secretary/administrator of Steps: 3 Entrance Stairs-Rails: Left Home Layout: One level Bathroom Shower/Tub: Engineer, manufacturing systems: Standard Home Equipment: Agricultural consultant (2 wheels), BSC/3in1 Additional Comments: lives with daughter   Functional History: Prior Function Prior Level of Function : Independent/Modified Independent Mobility Comments: independent without AD, no falls ADLs Comments: independent   Functional Status:  Mobility: Bed Mobility Overal bed mobility: Needs Assistance Bed Mobility: Supine to Sit, Sit to Supine Supine to sit: Min assist, HOB elevated Sit to supine: Min assist General bed mobility  comments: pt long sitting in bed upon arrival, attempted pivoting to EOB and further mobility but pt declining, assisted in posterior scooting back into bed and with pillow placement for back pain, HOB elevated to allow upright posture in bed Transfers Overall transfer level: Needs assistance Equipment used: Rolling walker (2 wheels) Transfers: Sit to/from Stand, Bed to chair/wheelchair/BSC Sit to Stand: Min assist, Mod assist Bed to/from chair/wheelchair/BSC transfer type:: Step pivot Step pivot transfers: Mod assist General transfer comment: pt declining all attempts at OOB mobility today due to fatigue, pain, and nausea Ambulation/Gait Ambulation/Gait assistance: Min assist Gait Distance (Feet): 3 Feet Assistive device: Rolling walker (2 wheels) Gait Pattern/deviations: Step-through pattern, Step-to pattern General Gait Details: pt declining   ADL: ADL Overall ADL's : Needs assistance/impaired Eating/Feeding: Modified independent Grooming: Set up, Min guard, Sitting (Decreased sitting balance/tolerance) Upper Body Bathing: Minimal assistance, Moderate assistance, Sitting (with extra time; decreased sitting balance/tolerance) Lower Body Bathing: Moderate assistance, Cueing for compensatory techniques, Sit to/from stand (decreased standing and sitting balance/tolerance) Upper Body Dressing : Min guard, Sitting (decreased sitting balance/tolerance) Lower Body Dressing: Minimal assistance, Moderate assistance, Cueing for compensatory techniques, Sit to/from stand (Min to Mod assist to maintain balance during stand) Toilet Transfer: Minimal assistance, Moderate assistance, BSC/3in1 (step pivot) Toileting- Clothing Manipulation and Hygiene: Maximal assistance, Sit to/from stand, Minimal assistance (Min assist follwoing urination, Max assist following BM) General ADL Comments: Pt declined funcitonal mobility attempt this session.   Cognition: Cognition Overall Cognitive Status: No  family/caregiver present to determine baseline cognitive functioning Orientation Level: Oriented X4 Cognition Arousal/Alertness: Awake/alert Behavior During Therapy: Flat affect Overall Cognitive Status: No family/caregiver present to determine baseline cognitive functioning General Comments: pt requiring encouragement and education on participation.   Physical Exam: Blood pressure (!) 106/54, pulse 78, temperature 98.2 F (36.8 C), temperature source Oral, resp. rate 16, height 5\' 5"  (1.651 m), weight 60 kg, SpO2 97 %. Physical Exam  Constitutional: No apparent distress. Appropriate appearance for age.  HENT: No JVD. Neck Supple. Trachea midline. Atraumatic, normocephalic. Eyes: PERRLA. EOMI. Visual fields grossly intact.  Cardiovascular: RRR, no murmurs/rub/gallops. No Edema. Peripheral pulses 2+  Respiratory: CTAB. No rales, rhonchi, or wheezing. On RA.  Abdomen: + bowel sounds, normoactive. No distention or tenderness.  Skin: + RUQ drain with green tinged clear fluid output; sutured in; nontender, no erythema.   + Right  IJ TDC dressing dry and intact without tenderness  MSK:      No apparent deformity.      Strength:  RUE: 5/5 SA, 5/5 EF, 5/5 EE, 5/5 WE, 5/5 FF, 5/5 FA                 LUE: 5/5 SA, 5/5 EF, 5/5 EE, 5/5 WE, 5/5 FF, 5/5 FA                 RLE: 5/5 HF, 5/5 KE, 5/5 DF, 5/5 EHL, 5/5 PF                 LLE:  5/5 HF, 5/5 KE, 5/5 DF, 5/5 EHL, 5/5 PF   Neurologic exam:  Cognition: AAO to person; not place or time; able to get city, month, and year Language: Difficult to assess d/t language barrier. Names 2/3 objects correctly.  Memory: Recalls 0/3 objects at 5 minutes. Insight: Good insight into current condition.  Mood: Appropriate mood and affect.  Sensation: To light touch intact in BL UEs and LEs  Reflexes: 2+ in BL UE and LEs. Negative Hoffman's and babinski signs bilaterally.  CN: 2-12 grossly intact.  Coordination: Bilateral upper extremity ataxia  - mild. Spasticity: MAS 0 in all extremities.     Lab Results Last 48 Hours        Results for orders placed or performed during the hospital encounter of 05/05/23 (from the past 48 hour(s))  Glucose, capillary     Status: Abnormal    Collection Time: 05/26/23  3:45 PM  Result Value Ref Range    Glucose-Capillary 105 (H) 70 - 99 mg/dL      Comment: Glucose reference range applies only to samples taken after fasting for at least 8 hours.  Glucose, capillary     Status: Abnormal    Collection Time: 05/26/23  8:30 PM  Result Value Ref Range    Glucose-Capillary 156 (H) 70 - 99 mg/dL      Comment: Glucose reference range applies only to samples taken after fasting for at least 8 hours.  CBC     Status: Abnormal    Collection Time: 05/27/23  4:43 AM  Result Value Ref Range    WBC 8.1 4.0 - 10.5 K/uL    RBC 3.26 (L) 3.87 - 5.11 MIL/uL    Hemoglobin 10.4 (L) 12.0 - 15.0 g/dL    HCT 54.0 (L) 98.1 - 46.0 %    MCV 91.1 80.0 - 100.0 fL    MCH 31.9 26.0 - 34.0 pg    MCHC 35.0 30.0 - 36.0 g/dL    RDW 19.1 47.8 - 29.5 %    Platelets 351 150 - 400 K/uL    nRBC 0.0 0.0 - 0.2 %      Comment: Performed at [facility], [address] [place], [place] [address]  Renal function panel     Status: Abnormal    Collection Time: 05/27/23  4:43 AM  Result Value Ref Range    Sodium 124 (L) 135 - 145 mmol/L    Potassium 4.1 3.5 - 5.1 mmol/L    Chloride 84 (L) 98 - 111 mmol/L    CO2 23 22 - 32 mmol/L    Glucose, Bld 104 (H) 70 - 99 mg/dL      Comment: Glucose reference range applies only to samples taken after fasting for at least 8 hours.    BUN 58 (H) 8 - 23 mg/dL    Creatinine, Ser 8.65 (H) 0.44 - 1.00 mg/dL    Calcium 8.8 (L) 8.9 - 10.3 mg/dL    Phosphorus 6.2 (H) 2.5 -  4.6 mg/dL    Albumin 2.3 (L) 3.5 - 5.0 g/dL    GFR, Estimated 6 (L) >60 mL/min      Comment: (NOTE) Calculated using the CKD-EPI Creatinine Equation (2021)      Anion gap 17 (H) 5 - 15      Comment: Performed at [facility], [address], [place], [place] [address]  Glucose, capillary     Status: Abnormal    Collection Time: 05/27/23  6:54 AM  Result Value Ref Range    Glucose-Capillary 110 (H) 70 - 99 mg/dL      Comment: Glucose reference range applies only to samples taken after fasting for at least 8 hours.  Glucose, capillary     Status: Abnormal    Collection Time: 05/27/23 12:34 PM  Result Value Ref Range    Glucose-Capillary 109 (H) 70 - 99 mg/dL      Comment: Glucose reference range applies only to samples taken after fasting for at least 8 hours.    Comment 1 Notify RN    Glucose, capillary     Status: Abnormal    Collection Time: 05/27/23  4:02 PM  Result Value Ref Range    Glucose-Capillary 183 (H) 70 - 99 mg/dL      Comment: Glucose reference range applies only to samples taken after fasting for at least 8 hours.    Comment 1 Notify RN    Glucose, capillary     Status: Abnormal    Collection Time: 05/27/23 10:31 PM  Result Value Ref Range    Glucose-Capillary 117 (H) 70 - 99 mg/dL      Comment: Glucose reference range applies only to samples taken after fasting for at least 8 hours.  Glucose, capillary     Status: Abnormal    Collection Time: 05/28/23  7:27 AM  Result Value Ref Range    Glucose-Capillary 145 (H) 70 - 99 mg/dL      Comment: Glucose reference range applies only to samples taken after fasting for at least 8 hours.  Glucose, capillary     Status: Abnormal    Collection Time: 05/28/23 11:22 AM  Result Value Ref Range    Glucose-Capillary 107 (H) 70 - 99 mg/dL      Comment: Glucose reference range applies only to samples taken after fasting for at least 8 hours.      Imaging Results (Last 48 hours)  No results found.         Blood pressure (!) 106/54, pulse 78, temperature 98.2 F (36.8 C), temperature source Oral, resp. rate 16, height 5\' 5"  (1.651 m), weight 60 kg, SpO2 97 %.   Medical Problem List and Plan:  1. Functional deficits secondary to  metabolic encephalopathy due to cholecystitis             - patient may  shower             - ELOS/Goals: 7-10 days, Mod I PT/OT  - Of note, workup did show MRI brain w/ punctate acute infarct-not clear if this is artifactual or a incidental finding; neurology deemed presentation more consistent with metabolic encephalopathy and gave no further recommendations.    2.  Antithrombotics: -DVT/anticoagulation:  Pharmaceutical: Heparin             -antiplatelet therapy: Plavix    3. Pain Management: Tylenol as needed   4. Mood/Behavior/Sleep: LCSW to evaluate and provide emotional support             -  antipsychotic agents: n/a   5. Neuropsych/cognition: This patient is capable of making decisions on her own behalf.   6. Skin/Wound Care: Routine skin care checks; pressure relief measures             -routine drain care   7. Fluids/Electrolytes/Nutrition: Strict Is and Os and follow-up chemistries per nephrology             -renal/carb modified diet             -continue protein supplementation, Renvela TID             -1200 cc FR   8: DM-2, A1c = 7.7%             -CGBs QID and carb mod diet             -continue Semglee 15 units daily             -continue Novolog 3 units daily   9: Hyperlipidemia: continue statin   10: Hyponatremia: management per nephrology; no intervention or recheck for Na 124 5/30 rec'd on admission   11: Acute on chronic diastolic CHF, pulmonary edema: EF 55-60%             -strict Is and Os and daily weight             -1200 cc FR             -continue BB   12: Hypertension: continue carvedilol 6.25 mg daily   13: Iron deficiency: continue Ferrlecit through 6/02   14: ESRD on HD via right IJ TDC on 5/20             -HD on T/T/S schedule             -has been accepted at [facility] on TTS 11:40     15: Anemia: continue IV iron and follow-up CBC   16: Cholelithiasis/cholecystitis s/p cholecystostomy tube             -leukocytosis  resolved, continue Augmentin through 6/01             -will need follow up in drain clinic and follow up with Dr. Sheliah Hatch after that to discuss whether she is a candidate for laparoscopic cholecystectomy - Daily flushes 5-10 cc    17: History of CAD: continue Plavix, statin, BB   18: Constipation possible contributing to nausea and lack of appetite             -continue Colace and Miralax BID; Dulcolax supp prn   Milinda Antis, PA-C  I have examined the patient independently and edited the note for HPI, ROS, exam, assessment, and plan as appropriate. I am in agreement with the above recommendations.   Angelina Sheriff, DO 05/28/2023

## 2023-05-28 NOTE — Progress Notes (Signed)
Patient brought to the unit, daughter at bedside. Oriented to unit activities. No concerns. Resting comfortably with call bell in place

## 2023-05-28 NOTE — Progress Notes (Signed)
[facility] NEPHROLOGY PROGRESS NOTE  Assessment/ Plan: Pt is a 72 y.o. yo female with a past medical history significant for hypertension, type II DM, PAD, CVA, HLD, CKD 3B who presented with shortness of breath and fluid overload.  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  OP HD was arranged but now is saying that [facility] wont accept so need to look for another unit. Plan for PD later on it seems but will need to wait until cholecystitis is better. If not going to pursue PD will need AV access down the line HD tomorrow,  to continue TTS schedule via [facility]  # Acute hypoxic respiratory failure due to fluid overload: Received thoracocentesis and diuresis.  Resolved. Now managing volume with HD.  # HTN/volume: BP acceptable.  No edema.  UF as tolerated.  Discontinue Lasix. Only on low dose coreg.  I think we have achieved EDW   # Anemia: Hemoglobin at goal-  no treatment needed as of yet but getting close-  iron low, repleting  # Acute cholecystitis: HIDA scan was positive therefore on antibiotics.  Seen by general surgery and IR.  S/p cholecystostomy tube placement.  # Acute metabolic encephalopathy - resolved  Bones-  phos up-  calc WNL-  added renvela.  PTH 106-  no meds   Subjective:  seen in room-    moving to CIR today -  also has been accepted to [facility] TTS  Objective Vital signs in last 24 hours: Vitals:   05/28/23 1400  BP: 131/76  Pulse: 74  Resp: 16  Temp: 98.4 F (36.9 C)  TempSrc: Oral  SpO2: 100%  Weight: 64 kg  Height: 5\' 5"  (1.651 m)   Weight change:  No intake or output data in the 24 hours ending 05/28/23 1730      Labs: RENAL PANEL Recent Labs  Lab 05/23/23 0354 05/24/23 0335 05/25/23 0218 05/27/23 0443 05/28/23 1437  NA 133* 134* 128* 124* 125*  K 3.8 4.2 3.6 4.1 3.5  CL 91* 91* 85* 84* 85*  CO2 21*  20* 20* 23 25  GLUCOSE 237* 243* 117* 104* 106*  BUN 46* 79* 95* 58* 33*  CREATININE 5.21* 7.32* 8.39* 6.48* 4.71*  CALCIUM 9.1 9.2 8.1*  9.0 8.8* 8.4*  PHOS 7.0* 8.4* 7.9* 6.2* 3.8  ALBUMIN 2.8* 2.5* 2.1* 2.3* 2.4*    Liver Function Tests: Recent Labs  Lab 05/22/23 0308 05/22/23 0309 05/23/23 0354 05/24/23 0335 05/25/23 0218 05/27/23 0443 05/28/23 1437  AST 33  --  68*  --   --   --   --   ALT 16  --  24  --   --   --   --   ALKPHOS 100  --  153*  --   --   --   --   BILITOT 1.0  --  1.0  --   --   --   --   PROT 7.5  --  7.7  --   --   --   --   ALBUMIN 2.7*   < > 2.8*   < > 2.1* 2.3* 2.4*   < > = values in this interval not displayed.   No results for input(s): "LIPASE", "AMYLASE" in the last 168 hours. Recent Labs  Lab 05/22/23 1621  AMMONIA 15   CBC: Recent Labs    05/23/23 0354  05/23/23 1335 05/25/23 0218 05/27/23 0443 05/28/23 1437  HGB 11.0* 10.8* 10.0* 10.4* 10.2*  MCV 94.7 91.6 89.6 91.1 91.9  FERRITIN  --   --  632*  --   --   TIBC  --   --  172*  --   --   IRON  --   --  13*  --   --     Cardiac Enzymes: No results for input(s): "CKTOTAL", "CKMB", "CKMBINDEX", "TROPONINI" in the last 168 hours. CBG: Recent Labs  Lab 05/27/23 1602 05/27/23 2231 05/28/23 0727 05/28/23 1122 05/28/23 1638  GLUCAP 183* 117* 145* 107* 120*    Iron Studies:  No results for input(s): "IRON", "TIBC", "TRANSFERRIN", "FERRITIN" in the last 72 hours.  Studies/Results: No results found.  Medications: Infusions:  sodium chloride     [START ON 05/29/2023] ferric gluconate (FERRLECIT) IVPB      Scheduled Medications:  amoxicillin-clavulanate  1 tablet Oral BID   [START ON 05/29/2023] atorvastatin  80 mg Oral Daily   carvedilol  6.25 mg Oral BID WC   [START ON 05/29/2023] Chlorhexidine Gluconate Cloth  6 each Topical Q0600   [START ON 05/29/2023] clopidogrel  75 mg Oral Daily   docusate sodium  100 mg Oral BID   guaiFENesin  600 mg Oral BID   heparin injection  (subcutaneous)  5,000 Units Subcutaneous Q8H   insulin aspart  0-15 Units Subcutaneous TID WC   insulin aspart  0-5 Units Subcutaneous QHS   insulin aspart  3 Units Subcutaneous TID WC   [START ON 05/29/2023] insulin glargine-yfgn  15 Units Subcutaneous Daily   polyethylene glycol  17 g Oral BID   sevelamer carbonate  1,600 mg Oral TID WC    have reviewed scheduled and prn medications.  Physical Exam: General:NAD, comfortable Heart:RRR, s1s2 nl Lungs:clear b/l, no crackle Abdomen:soft, mild tenderness, not distended. Extremities:No edema Dialysis Access: TDC  Kairos Panetta A Elie Leppo 05/28/2023,5:30 PM  LOS: 0 days

## 2023-05-28 NOTE — Progress Notes (Signed)
Patient ID: Samantha [name], female   DOB: June 01, 1951, 72 y.o.   MRN: [id number] Met with the patient and daughter to review current situation, rehab process, team conference and plan of care. Reviewed medications and dietary modifications  including renal CMM diet with 1200 CC FR for ESRD-HD (T,H,Sa) with semglee for DM (A1C 7.7 in April), HTN, HLD and HF.  Reviewed post CVA medication and initiated education on biliary drain care.  Written information provided to daughter for care. Continue to follow along to address educational needs to facilitate preparation for discharge home. Pamelia Hoit

## 2023-05-28 NOTE — Progress Notes (Signed)
[facility] NEPHROLOGY PROGRESS NOTE  Assessment/ Plan: Pt is Samantha 72 y.o. yo female with HTN, type II DM, PAD, CVA, HLD, CKD 3B who presented with shortness of breath and fluid overload.  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  OP HD was arranged but now is saying that [facility] wont accept so need to look for another unit. Plan for PD later on it seems but will need to wait until cholecystitis is better. If not going to pursue PD will need AV access down the line HD tomorrow, to continue TTS schedule via [facility]  # Acute hypoxic respiratory failure due to fluid overload: Received thoracocentesis and diuresis.  Resolved. Now managing volume with HD-  seems to be at EDW  # HTN/volume: BP acceptable.  No edema.  UF as tolerated.  Discontinue Lasix. Only on low dose coreg.  I think we have achieved EDW however , hyponatremia would argue against  # Anemia: Hemoglobin at goal-  no treatment needed as of yet but getting close-  iron low, repleting  # Acute cholecystitis: HIDA scan was positive therefore on antibiotics.  Seen by general surgery and IR.  S/p cholecystostomy tube placement.  # Acute metabolic encephalopathy - resolved  Bones-  phos up-  calc WNL-  added renvela.  PTH 106-  no meds   Subjective:  seen in room-  no c/o's-  some issue with HD cath yesterday seeming to be positional did complete all but 20 min of treatment-  removed almost Samantha liter   Objective Vital signs in last 24 hours: Vitals:   05/27/23 2017 05/28/23 0329 05/28/23 0727 05/28/23 0800  BP: 122/83 132/66 137/72 (!) 106/54  Pulse: 75 76 82 78  Resp: 16 16    Temp: 98.2 F (36.8 C) 98.2 F (36.8 C)    TempSrc: Oral Oral    SpO2: 92% 100% 97% 97%  Weight:      Height:       Weight change: -1.2 kg  Intake/Output Summary (Last 24 hours) at 05/28/2023 0955 Last data filed at  05/28/2023 0600 Gross per 24 hour  Intake 745.03 ml  Output 1380 ml  Net -634.97 ml       Labs: RENAL PANEL Recent Labs  Lab 05/22/23 0309 05/23/23 0354 05/24/23 0335 05/25/23 0218 05/27/23 0443  NA 131* 133* 134* 128* 124*  K 4.1 3.8 4.2 3.6 4.1  CL 92* 91* 91* 85* 84*  CO2 21* 21* 20* 20* 23  GLUCOSE 182* 237* 243* 117* 104*  BUN 60* 46* 79* 95* 58*  CREATININE 5.87* 5.21* 7.32* 8.39* 6.48*  CALCIUM 9.3 9.1 9.2 8.1*  9.0 8.8*  PHOS 7.9* 7.0* 8.4* 7.9* 6.2*  ALBUMIN 2.8* 2.8* 2.5* 2.1* 2.3*    Liver Function Tests: Recent Labs  Lab 05/22/23 0308 05/22/23 0309 05/23/23 0354 05/24/23 0335 05/25/23 0218 05/27/23 0443  AST 33  --  68*  --   --   --   ALT 16  --  24  --   --   --   ALKPHOS 100  --  153*  --   --   --   BILITOT 1.0  --  1.0  --   --   --   PROT 7.5  --  7.7  --   --   --   ALBUMIN 2.7*   < > 2.8* 2.5*  2.1* 2.3*   < > = values in this interval not displayed.   No results for input(s): "LIPASE", "AMYLASE" in the last 168 hours. Recent Labs  Lab 05/22/23 1621  AMMONIA 15   CBC: Recent Labs    05/22/23 0308 05/23/23 0354 05/23/23 1335 05/25/23 0218 05/27/23 0443  HGB 10.7* 11.0* 10.8* 10.0* 10.4*  MCV 91.5 94.7 91.6 89.6 91.1  FERRITIN  --   --   --  632*  --   TIBC  --   --   --  172*  --   IRON  --   --   --  13*  --     Cardiac Enzymes: No results for input(s): "CKTOTAL", "CKMB", "CKMBINDEX", "TROPONINI" in the last 168 hours. CBG: Recent Labs  Lab 05/27/23 0654 05/27/23 1234 05/27/23 1602 05/27/23 2231 05/28/23 0727  GLUCAP 110* 109* 183* 117* 145*    Iron Studies:  No results for input(s): "IRON", "TIBC", "TRANSFERRIN", "FERRITIN" in the last 72 hours.  Studies/Results: No results found.  Medications: Infusions:  sodium chloride Stopped (05/09/23 2241)   ferric gluconate (FERRLECIT) IVPB 250 mg (05/28/23 0927)    Scheduled Medications:  amoxicillin-clavulanate  1 tablet Oral BID   atorvastatin  80 mg Oral  Daily   bisacodyl  10 mg Rectal Daily   carvedilol  6.25 mg Oral BID WC   Chlorhexidine Gluconate Cloth  6 each Topical Q0600   Chlorhexidine Gluconate Cloth  6 each Topical Q0600   clopidogrel  75 mg Oral Daily   diphenhydrAMINE  25 mg Intravenous Once   docusate sodium  100 mg Oral BID   fluticasone  2 spray Each Nare Daily   guaiFENesin  600 mg Oral BID   heparin injection (subcutaneous)  5,000 Units Subcutaneous Q8H   insulin aspart  0-15 Units Subcutaneous TID WC   insulin aspart  0-5 Units Subcutaneous QHS   insulin aspart  3 Units Subcutaneous TID WC   insulin glargine-yfgn  15 Units Subcutaneous Daily   oxymetazoline  1 spray Each Nare Once   polyethylene glycol  17 g Oral BID   sevelamer carbonate  1,600 mg Oral TID WC   sodium chloride flush  3 mL Intravenous Q12H    have reviewed scheduled and prn medications.  Physical Exam: General:NAD, comfortable Heart:RRR, s1s2 nl Lungs:clear b/l, no crackle Abdomen:soft, mild tenderness, not distended. Extremities:No edema Dialysis Access: TDC  Samantha [name] Samantha [name] 05/28/2023,9:55 AM  LOS: 23 days

## 2023-05-28 NOTE — Progress Notes (Signed)
Inpatient Rehabilitation Admission Medication Review by a Pharmacist  A complete drug regimen review was completed for this patient to identify any potential clinically significant medication issues.  High Risk Drug Classes Is patient taking? Indication by Medication  Antipsychotic No   Anticoagulant Yes Heparin: VTE ppx  Antibiotic Yes Augmentin: cholecystitis (end 5/31)  Opioid No   Antiplatelet Yes Plavix: stroke  Hypoglycemics/insulin Yes SSI, Semglee: diabetes  Vasoactive Medication Yes Coreg: HTN, CAD  Chemotherapy No   Other Yes Lipitor: stroke, CAD, HLD Ferrlecit: Iron deficiency Mucinex: sputum Melatonin: sleep Zofran: nausea Colace, Miralax: constipation Renvela: ESRD/hyperphos Apap: pain Robaxin: muscle spasms Trazodone: sleep     Type of Medication Issue Identified Description of Issue Recommendation(s)  Drug Interaction(s) (clinically significant)     Duplicate Therapy     Allergy     No Medication Administration End Date     Incorrect Dose     Additional Drug Therapy Needed     Significant med changes from prior encounter (inform family/care partners about these prior to discharge). PTA meds not ordered inpatient: valsartan, metformin Communicate medication changes with patient/family at discharge  Other       Clinically significant medication issues were identified that warrant physician communication and completion of prescribed/recommended actions by midnight of the next day:  No  Name of provider notified for urgent issues identified:    Provider Method of Notification:     Pharmacist comments:   Time spent performing this drug regimen review (minutes): 30   Thank you for allowing pharmacy to be a part of this patient's care.   Signe Colt, PharmD 05/28/2023 2:15 PM  **Pharmacist phone directory can be found on [email or web address] listed under [facility]**

## 2023-05-28 NOTE — Progress Notes (Addendum)
Pt has been accepted at [facility] in [facility] on TTS 11:40 chair time. Pt will need to arrive at 11:00 for first appt to complete paperwork prior to treatment. Will need to coordinate pt's start date with clinic once pt's d/c date is known. Spoke to pt's daughter via phone. Daughter advised of arrangements and text sent to daughter with clinic name, address, phone number and schedule details. Will add to AVS as well at d/c. Daughter with questions regarding transportation to HD at d/c. Contacted attending, nephrologist, rehab coordinator, RN CM and CSW with update on out-pt HD arrangements and requested that [facility] staff please contact pt's daughter to discuss transportation options per daughter's request. Will assist as needed.   Olivia Canter Renal Navigator [phone]  Addendum at 4:14 pm: Wynema Birch with [facility] out-pt HD to provide update that pt was d/c to CIR today. Beverly requested d/c summary be faxed for continuation of care. D/C summary faxed this afternoon. Will advise Beverly of pt's d/c date from CIR once known.

## 2023-05-28 NOTE — Discharge Summary (Signed)
PATIENT DETAILS Name: Samantha [name] Age: 72 y.o. Sex: female Date of Birth: December 21, 1951 MRN: [id number]. Admitting Physician: Meredeth Ide, MD WUJ:[id number], Odette Horns, MD  Admit Date: 05/05/2023 Discharge date: 05/28/2023  Recommendations for Outpatient Follow-up:  Follow up with PCP in 1-2 weeks Please obtain CMP/CBC in one week Please ensure follow-up with nephrology, general surgery after discharge.  Admitted From:  Home  Disposition: CIR   Discharge Condition: good  CODE STATUS:   Code Status: Full Code   Diet recommendation:  Diet Order             Diet - low sodium heart healthy           Diet Carb Modified           Diet renal/carb modified with fluid restriction Diet-HS Snack? Nothing; Fluid restriction: 1200 mL Fluid; Room service appropriate? Yes; Fluid consistency: Thin  Diet effective now                    Brief Summary: Patient is a 72 y.o.  female with history of HTN, DM-2, PAD, CVA, depression/anxiety-who presented with acute hypoxic respiratory failure in the setting of pulm edema with worsening renal function.  Unfortunately-thought to have progressed to ESRD-requiring HD, for the hospital course complicated by development of acute metabolic encephalopathy, and acute calculus cholecystitis.     Significant events: 5/08>> admit to [facility]   Significant studies: 5/08>> CT chest/abdomen/pelvis: New bilateral pleural effusions, right> left parenchymal opacities 5/08>> renal ultrasound: No hydronephrosis 5/09>>Echo: EF 55-60% 5/12>> CT chest: Increasing interstitial/patchy groundglass opacities-decreasing small bilateral pleural effusions. 5/14>> gastric emptying study: Nondiagnostic due to emesis 5/24>> HIDA scan: Non- visualization of cystic duct 5/25>> CT head: No acute intracranial abnormality. 5/25>> MRA brain: Intracranial atherosclerosis including severe left M2/right A2 stenosis, moderate left V4, basilar and PCA stenosis. 5/25>> MRI  brain: Punctate acute left cerebellar infarct. 5/26>> LTM EEG: No seizures.   Significant microbiology data: 5/08>> COVID PCR: Negative 5/08>> blood culture: Negative 5/11>> pleural fluid culture: No growth 5/22>> blood culture: No growth   Procedures: 5/11>> thoracocentesis (pleural fluid cytology negative) 5/11>> nontunneled dialysis catheter by PCCM 5/20>> tunneled dialysis catheter by IR 5/26>> cholecystostomy tube by IR.   Consults: PCCM Nephrology Interventional radiology General surgery Neurology    Brief Hospital Course: AKI on CKD stage IIIb Likely hemodynamically mediated-suspicion that she may have progressed to ESRD Nephrology following and directing HD scheduled/care. Continue to monitor for signs of renal recovery   Acute hypoxic respiratory failure Secondary to acute pulm edema in the setting of worsening renal function/pleural effusion.  (PNA ruled out) Volume being managed with HD Currently improved-and on room air   Acute calculus cholecystitis  Had low-grade fever on 5/22 and subsequently worsening leukocytosis/RUQ pain on 5/24-HIDA scan positive-evaluated by general surgery-Plavix held-underwent cholecystostomy tube placement by IR on 5/26 Initially on Zosyn-has been transitioned to Augmentin-end date 5/31. Patient will continue cholecystostomy tube drainage on discharge-and will follow general surgery in the outpatient setting.   Acute metabolic encephalopathy Had significant worsening of mental status at the end of HD on 5/25-neuroimaging negative for CVA, LTM EEG negative for seizures.  Suspicion is for encephalopathy in the setting of cholecystitis.  Thankfully after placement of cholecystostomy tube-she is significantly improved-and completely awake and alert. MRI brain showed a punctate acute infarct-not clear if this is artifactual or a insignificant/incidental finding.  No further recommendations by neurology at this point.   Hyponatremia Due to  impaired water excretion in the  setting of worsening renal function Improving with ongoing volume removal with hemodialysis.   Hypokalemia Repleted   Acute on chronic HFrEF Acute pulm edema Volume status stable with HD   History of CAD No anginal symptoms Echo with stable EF Continue Plavix/beta-blocker/statin.   HTN BP stable on Coreg Follow/optimize   HLD Statin   DM-2 (A1c 7.7 on 4/18) CBGs stable on 15 units of Semglee daily, 3 units of NovoLog with meals and SSI  Subcentimeter left thyroid nodule Per radiology-not clinically significant-no follow-up recommendations.   BMI: Estimated body mass index is 22.01 kg/m as calculated from the following:   Height as of this encounter: 5\' 5"  (1.651 m).   Weight as of this encounter: 60 kg.   Discharge Diagnoses:  Principal Problem:   Acute hypoxemic respiratory failure (HCC) Active Problems:   Essential hypertension   Type 2 diabetes mellitus with complication, with long-term current use of insulin (HCC)   Non compliance w medication regimen   AKI (acute kidney injury) (HCC)   History of CVA (cerebrovascular accident)   Discharge Instructions:  Activity:  As tolerated with Full fall precautions use walker/cane & assistance as needed  Discharge Instructions     Diet - low sodium heart healthy   Complete by: As directed    Diet Carb Modified   Complete by: As directed    Increase activity slowly   Complete by: As directed    No wound care   Complete by: As directed       Allergies as of 05/28/2023       Reactions   Chicken Allergy Other (See Comments)   headache   Perflutren Other (See Comments)   Pt with back pain as soon as administered.- patient refuted this in 2024        Medication List     STOP taking these medications    amLODipine 10 MG tablet Commonly known as: NORVASC   cephALEXin 500 MG capsule Commonly known as: KEFLEX   diltiazem 120 MG 12 hr capsule Commonly known as:  CARDIZEM SR   furosemide 40 MG tablet Commonly known as: Lasix   glimepiride 4 MG tablet Commonly known as: AMARYL   HumuLIN 70/30 KwikPen (70-30) 100 UNIT/ML KwikPen Generic drug: insulin isophane & regular human KwikPen   hydrALAZINE 50 MG tablet Commonly known as: APRESOLINE   Insulin Syringe-Needle U-100 31G X 15/64" 0.5 ML Misc Commonly known as: BD Insulin Syringe Ultrafine   sodium bicarbonate 650 MG tablet   TRUEplus Lancets 28G Misc       TAKE these medications    amoxicillin-clavulanate 500-125 MG tablet Commonly known as: AUGMENTIN Take 1 tablet by mouth 2 (two) times daily. Last dose 5/31 pm   atorvastatin 80 MG tablet Commonly known as: LIPITOR Tome 1 tableta (80 mg en total) por va oral diariamente. (Take 1 tablet (80 mg total) by mouth daily.)   carvedilol 6.25 MG tablet Commonly known as: COREG Take 1 tablet (6.25 mg total) by mouth 2 (two) times daily with a meal. What changed:  medication strength how much to take   clopidogrel 75 MG tablet Commonly known as: PLAVIX Tome 1 tableta (75 mg en total) por va oral diariamente. (Take 1 tablet (75 mg total) by mouth daily.)   glucose blood test strip Use 3 times daily before meals   insulin aspart 100 UNIT/ML injection Commonly known as: novoLOG Inject 3 Units into the skin 3 (three) times daily with meals.   NovoLOG FlexPen 100  UNIT/ML FlexPen Generic drug: insulin aspart 0-9 Units, Subcutaneous, 3 times daily with meals CBG < 70: Implement Hypoglycemia measures CBG 70 - 120: 0 units CBG 121 - 150: 1 unit CBG 151 - 200: 2 units CBG 201 - 250: 3 units CBG 251 - 300: 5 units CBG 301 - 350: 7 units CBG 351 - 400: 9 units CBG > 400: call MD   insulin glargine-yfgn 100 UNIT/ML injection Commonly known as: SEMGLEE Inject 0.15 mLs (15 Units total) into the skin daily. Start taking on: May 29, 2023   sevelamer carbonate 800 MG tablet Commonly known as: RENVELA Take 2 tablets (1,600 mg total) by  mouth 3 (three) times daily with meals.        Follow-up Information     [facility] Follow up on 05/17/2023.   Why: at 9:10 With Dr Valarie Merino information: [address] [place] [address] [phone]        [facility] [place]. Go on 05/25/2023.   Why: Schedule is Tuesday/Thursday/Saturday with 11:30 am chair time.  On Tuesday (5/28), please arrive at 11:00 am to complete paperwork prior to treatment. Contact information: [address] [place] [address] [phone]         [facility] [facility] Follow up.   Why: Reservations must be made by 5 pm one day in advance or a client may schedule a trip from one to seven days prior to the desired travel date. For a reservation, call [phone]. In addition, reservations may also be made online: [email or web address] Contact information: [phone]        Non-emergency: [facility]. Call.   Why: Non-emergency transportation: [facility] can arrange and pay for your transportation to help you get to and from your appointments for Medicaid-covered care. This service is at no cost to you. Call [facility] at [phone] with any changes or to cancel your appointment. Please call as soon as you know of the change. If you do not keep your appointment, you will be marked as a no-show. Please call up to two business day before your appointment to schedule your ride. Contact information: [phone]        Kinsinger, De Blanch, MD. Go on 06/22/2023.   Specialty: General Surgery Why: 1:50 PM. Please arrive 30 min prior to appointment time to check in. Contact information: [address] [place]  [place] [address] [phone]                Allergies  Allergen Reactions   Chicken Allergy Other (See Comments)    headache   Perflutren Other (See Comments)    Pt with back pain as soon as administered.- patient refuted this in 2024     Other Procedures/Studies: IR Perc Cholecystostomy  Result Date: 05/25/2023 INDICATION: 72 year old female with acute cholecystitis. She has a poor operative candidate and presents for cholecystostomy tube placement. EXAM: CHOLECYSTOSTOMY MEDICATIONS: In patient currently receiving intravenous antibiotics. No additional antibiotic prophylaxis was administered. ANESTHESIA/SEDATION: Moderate (conscious) sedation was employed during this procedure. A total of Versed 1.5 mg and Fentanyl 75 mcg was administered intravenously. Moderate Sedation Time: 21 minutes. The patient's level of consciousness and vital signs were monitored continuously by radiology nursing throughout the procedure under my direct supervision. FLUOROSCOPY TIME:  Radiation exposure index: 4.6 mGy COMPLICATIONS: None immediate. PROCEDURE: Informed written consent was obtained from the patient after a thorough discussion of the procedural risks, benefits and  alternatives. All questions were addressed. Maximal Sterile Barrier Technique was utilized including caps, mask, sterile gowns, sterile gloves, sterile drape, hand hygiene and skin antiseptic. A timeout was performed prior to the initiation of the procedure. The right upper quadrant was interrogated with ultrasound. The massively distended gallbladder was successfully identified. A suitable skin entry site was selected and marked. Local anesthesia was attained by infiltration with 1% lidocaine. A small dermatotomy was made. Under real-time ultrasound guidance, a 21 gauge trocar needle was advanced along a short transhepatic tract and into the gallbladder lumen. A wire was then advanced into the gallbladder lumen. The needle was exchanged and the  gall to transitional dilator was advanced over the wire and into the gallbladder lumen. Aspiration was performed yielding black bile. A small amount of contrast was injected confirming that the device was indeed within the gallbladder lumen. Images were obtained and stored for the medical record. A 0.035 wire was then advanced in the gallbladder lumen. The percutaneous tract was dilated to 10 [place]. A Cook 10 [place] all-purpose drainage catheter was advanced over the wire and formed in the gallbladder lumen. Several 100 mL of viscous black bile were then aspirated. A sample was sent for Gram stain and culture. The catheter was connected to bag drainage and secured to the skin with 0 Prolene suture. Bandages were applied. The patient tolerated the procedure well. IMPRESSION: Successful placement of transhepatic percutaneous cholecystostomy tube for the treatment of acute cholecystitis. Electronically Signed   By: Malachy Moan M.D.   On: 05/25/2023 08:54   EEG adult  Result Date: 05/23/2023 Charlsie Quest, MD     05/23/2023  6:27 AM Patient Name: Samantha [name] MRN: [id number] Epilepsy Attending: Charlsie Quest Referring Physician/Provider: Maretta Bees, MD Date: 05/22/2023 Duration: 27.33 mins Patient history: 72yo F with ams getting eeg to evaluate for seizure. Level of alertness: lethargic AEDs during EEG study: None Technical aspects: This EEG study was done with scalp electrodes positioned according to the 10-20 International system of electrode placement. Electrical activity was reviewed with band pass filter of 1-70Hz , sensitivity of 7 uV/mm, display speed of 53mm/sec with a 60Hz  notched filter applied as appropriate. EEG data were recorded continuously and digitally stored.  Video monitoring was available and reviewed as appropriate. Description: No clear posterior dominant rhythm was seen. EEG showed continuous generalized 3 to 7 Hz theta-delta slowing. Intermittent generalized periodic  discharges with triphasic morphology at 2 Hz were also noted. Physiologic photic driving was not seen during photic stimulation.  Hyperventilation was not performed.   ABNORMALITY - Periodic discharges with triphasic morphology, generalized ( GPDs) - Continuous slow, generalized IMPRESSION: This study showed generalized periodic discharges with triphasic morphology which is most likely secondary to toxic-metabolic causes. However, the frequency of 2hz  can rarely evolve into ictal-interictal continuum. Recommend long term monitoring for better evaluation. Additionally there is moderate diffuse encephalopathy. No seizures were seen throughout the recording. Charlsie Quest   MR BRAIN WO CONTRAST  Result Date: 05/22/2023 CLINICAL DATA:  Neuro deficit, acute, stroke suspected.  Aphasia. EXAM: MRI HEAD WITHOUT CONTRAST MRA HEAD WITHOUT CONTRAST TECHNIQUE: Multiplanar, multi-echo pulse sequences of the brain and surrounding structures were acquired without intravenous contrast. Angiographic images of the Circle of Willis were acquired using MRA technique without intravenous contrast. COMPARISON:  Head CT 05/22/2023. Head MRI and MRA 11/08/2021. Head and neck CTA 11/09/2021. FINDINGS: MRI HEAD FINDINGS Brain: There is a 3 mm focus of restricted diffusion peripherally in the posterior left cerebellar  hemisphere consistent with an acute infarct. There are a few punctate foci of mildly increased signal on axial trace diffusion weighted imaging without corresponding reduced ADC in the right greater than left cerebral hemispheric white matter which may reflect subacute to chronic ischemia. A few chronic microhemorrhages are noted in the basal ganglia. There is a small chronic left cerebellar infarct with associated chronic blood products. T2 hyperintensities in the cerebral white matter bilaterally are similar to the prior MRI and are nonspecific but compatible with mild chronic small vessel ischemic disease. Chronic  lacunar infarcts are noted in the right basal ganglia, left thalamus, and deep cerebral white matter. No mass, midline shift, or extra-axial fluid collection is identified. The ventricles and sulci are within normal limits for age. A partially empty sella is unchanged. Vascular: Major intracranial vascular flow voids are preserved. Skull and upper cervical spine: Unremarkable bone marrow signal. Sinuses/Orbits: Unremarkable orbits. No significant inflammatory changes in the paranasal sinuses. No significant mastoid fluid. Other: None. MRA HEAD FINDINGS Anterior circulation: The internal carotid arteries are patent from skull base to carotid termini with up to mild chronic right paraclinoid stenosis. ACAs and MCAs are patent without evidence of a proximal branch occlusion. There is only mild narrowing of the distal left M1 segment where there was previously a severe stenosis. There is a severe stenosis of the origin of the left M2 superior division. There is a new severe mid to distal right A2 stenosis. No aneurysm is identified. Posterior circulation: The included portions of the intracranial vertebral arteries are patent with the right being dominant and with chronic moderate to severe stenoses of the left V4 segment with suspicion for a left vertebral dissection raised on the 2022 studies. Patent bilateral PICA, right AICA, and bilateral SCA origins are visualized. 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 There is a fetal origin of both PCAs both PCAs are patent with moderate bilateral P2 stenoses. No aneurysm is identified. Anatomic variants: Hypoplastic right A1 segment. IMPRESSION: 1. Punctate acute left cerebellar infarct. 2. 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 3. No large vessel occlusion. 4. Intracranial atherosclerosis including severe left M2 and  right A2 stenoses and moderate left V4, basilar, and PCA stenoses. Electronically Signed   By: Sebastian Ache M.D.   On: 05/22/2023 15:48   MR ANGIO HEAD WO CONTRAST  Result Date: 05/22/2023 CLINICAL DATA:  Neuro deficit, acute, stroke suspected.  Aphasia. EXAM: MRI HEAD WITHOUT CONTRAST MRA HEAD WITHOUT CONTRAST TECHNIQUE: Multiplanar, multi-echo pulse sequences of the brain and surrounding structures were acquired without intravenous contrast. Angiographic images of the Circle of Willis were acquired using MRA technique without intravenous contrast. COMPARISON:  Head CT 05/22/2023. Head MRI and MRA 11/08/2021. Head and neck CTA 11/09/2021. FINDINGS: MRI HEAD FINDINGS Brain: There is a 3 mm focus of restricted diffusion peripherally in the posterior left cerebellar hemisphere consistent with an acute infarct. There are a few punctate foci of mildly increased signal on axial trace diffusion weighted imaging without corresponding reduced ADC in the right greater than left cerebral hemispheric white matter which may reflect subacute to chronic ischemia. A few chronic microhemorrhages are noted in the basal ganglia. There is a small chronic left cerebellar infarct with associated chronic blood products. T2 hyperintensities in the cerebral white matter bilaterally are similar to the prior MRI and are nonspecific but compatible with mild chronic small vessel ischemic disease.  Chronic lacunar infarcts are noted in the right basal ganglia, left thalamus, and deep cerebral white matter. No mass, midline shift, or extra-axial fluid collection is identified. The ventricles and sulci are within normal limits for age. A partially empty sella is unchanged. Vascular: Major intracranial vascular flow voids are preserved. Skull and upper cervical spine: Unremarkable bone marrow signal. Sinuses/Orbits: Unremarkable orbits. No significant inflammatory changes in the paranasal sinuses. No significant mastoid fluid. Other: None. MRA  HEAD FINDINGS Anterior circulation: The internal carotid arteries are patent from skull base to carotid termini with up to mild chronic right paraclinoid stenosis. ACAs and MCAs are patent without evidence of a proximal branch occlusion. There is only mild narrowing of the distal left M1 segment where there was previously a severe stenosis. There is a severe stenosis of the origin of the left M2 superior division. There is a new severe mid to distal right A2 stenosis. No aneurysm is identified. Posterior circulation: The included portions of the intracranial vertebral arteries are patent with the right being dominant and with chronic moderate to severe stenoses of the left V4 segment with suspicion for a left vertebral dissection raised on the 2022 studies. Patent bilateral PICA, right AICA, and bilateral SCA origins are visualized. 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 There is a fetal origin of both PCAs both PCAs are patent with moderate bilateral P2 stenoses. No aneurysm is identified. Anatomic variants: Hypoplastic right A1 segment. IMPRESSION: 1. Punctate acute left cerebellar infarct. 2. 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 3. No large vessel occlusion. 4. Intracranial atherosclerosis including severe left M2 and right A2 stenoses and moderate left V4, basilar, and PCA stenoses. Electronically Signed   By: Sebastian Ache M.D.   On: 05/22/2023 15:48   CT HEAD CODE STROKE WO CONTRAST  Result Date: 05/22/2023 CLINICAL DATA:  Code stroke. Neuro deficit, acute, stroke suspected. Aphasia. L3 mental status. EXAM: CT HEAD WITHOUT CONTRAST TECHNIQUE: Contiguous axial images were obtained from the base of the skull through the vertex without intravenous contrast. RADIATION DOSE REDUCTION: This exam was performed according to the departmental  dose-optimization program which includes automated exposure control, adjustment of the mA and/or kV according to patient size and/or use of iterative reconstruction technique. COMPARISON:  Head CT 04/14/2023 and MRI 11/08/2021 FINDINGS: Brain: There is no evidence of an acute infarct, intracranial hemorrhage, mass, midline shift, or extra-axial fluid collection. A chronic left cerebellar infarct and chronic bilateral basal ganglia lacunar infarcts are again noted. Mild hypodensities in the cerebral white matter bilaterally are unchanged and nonspecific but compatible with chronic small vessel ischemic disease. The ventricles and sulci are normal. Vascular: Calcified atherosclerosis at the skull base. No hyperdense vessel. Skull: No acute fracture or suspicious osseous lesion. Sinuses/Orbits: Minimal mucosal thickening in the paranasal sinuses. Clear mastoid air cells. Unremarkable orbits. Other: None. ASPECTS ([place] Stroke Program Early CT Score) - Ganglionic level infarction (caudate, lentiform nuclei, internal capsule, insula, M1-M3 cortex): 7 - Supraganglionic infarction (M4-M6 cortex): 3 Total score (0-10 with 10 being normal): 10 These results were communicated to Dr. Wilford Corner at 1:31 pm on 05/22/2023 by text page via the [facility] messaging system. IMPRESSION: 1. No evidence of acute intracranial abnormality. ASPECTS of 10. 2. Chronic ischemia with old basal ganglia and cerebellar infarcts. Electronically Signed   By: Sebastian Ache M.D.   On: 05/22/2023 13:33   NM Hepatobiliary Liver Func  Result Date: 05/21/2023 CLINICAL DATA:  Gallstones. EXAM: NUCLEAR MEDICINE HEPATOBILIARY IMAGING TECHNIQUE: Sequential images of the abdomen were obtained out to 60 minutes following intravenous administration of radiopharmaceutical. RADIOPHARMACEUTICALS:  5.0 mCi Tc-3m  Choletec IV COMPARISON:  CT 05/05/2023 FINDINGS: Prompt uptake and biliary excretion of activity by the liver is seen. Biliary activity passes into small  bowel, consistent with patent common bile duct. No gallbladder activity identified after 120 minutes of sequential imaging. IMPRESSION: 1. Nonvisualization of the gallbladder after 120 minutes of imaging compatible with cholecystitis. 2. Normal biliary to bowel transit consistent with patent common bile duct. Electronically Signed   By: Signa Kell M.D.   On: 05/21/2023 17:23   IR Fluoro Guide CV Line Right  Result Date: 05/17/2023 INDICATION: End-stage renal disease.  Request for a tunneled dialysis catheter. EXAM: FLUOROSCOPIC AND ULTRASOUND GUIDED PLACEMENT OF A TUNNELED DIALYSIS CATHETER Physician: Rachelle Hora. Lowella Dandy, MD MEDICATIONS: Ancef 2 g; The antibiotic was administered within an appropriate time interval prior to skin puncture. ANESTHESIA/SEDATION: Moderate (conscious) sedation was employed during this procedure. A total of Versed 1mg  and fentanyl 75 mcg was administered intravenously at the order of the provider performing the procedure. Total intra-service moderate sedation time: 26 minutes. Patient's level of consciousness and vital signs were monitored continuously by radiology nurse throughout the procedure under the supervision of the provider performing the procedure. FLUOROSCOPY TIME:  Radiation Exposure Index (as provided by the fluoroscopic device): 2 mGy Kerma COMPLICATIONS: None immediate. PROCEDURE: Informed consent was obtained for placement of a tunneled dialysis catheter. The patient was placed supine on the interventional table. Ultrasound confirmed a patent right internal jugular vein. Ultrasound image obtained for documentation. The right neck and chest was prepped and draped in a sterile fashion. Maximal barrier sterile technique was utilized including caps, mask, sterile gowns, sterile gloves, sterile drape, hand hygiene and skin antiseptic. The existing right jugular catheter was removed with manual compression. The right neck was anesthetized with 1% lidocaine. A small incision was  made with #11 blade scalpel. A 21 gauge needle directed into the right internal jugular vein with ultrasound guidance. A micropuncture dilator set was placed. A 19 cm tip to cuff Palindrome catheter was selected. The skin below the right clavicle was anesthetized and a small incision was made with an #11 blade scalpel. A subcutaneous tunnel was formed to the vein dermatotomy site. The catheter was brought through the tunnel. The vein dermatotomy site was dilated to accommodate a peel-away sheath. The catheter was placed through the peel-away sheath and directed into the central venous structures. The tip of the catheter was placed in the right atrium with fluoroscopy. Fluoroscopic images were obtained for documentation. Both lumens were found to aspirate and flush well. The proper amount of heparin was flushed in both lumens. The vein dermatotomy site was closed using a single layer of absorbable suture and Dermabond. The catheter was secured to the skin using Prolene suture. FINDINGS: Old right jugular catheter was removed. New jugular catheter tip in the right atrium. IMPRESSION: Successful placement of a right jugular tunneled dialysis catheter using ultrasound and fluoroscopic guidance. Electronically Signed   By: Richarda Overlie M.D.   On: 05/17/2023 17:48   IR US Guide Vasc Access Right  Result Date: 05/17/2023 INDICATION: End-stage renal disease.  Request for a tunneled dialysis catheter. EXAM: FLUOROSCOPIC AND ULTRASOUND GUIDED PLACEMENT OF A TUNNELED DIALYSIS CATHETER Physician: Rachelle Hora. Lowella Dandy, MD MEDICATIONS: Ancef 2 g; The antibiotic was administered within an appropriate time interval prior  to skin puncture. ANESTHESIA/SEDATION: Moderate (conscious) sedation was employed during this procedure. A total of Versed 1mg  and fentanyl 75 mcg was administered intravenously at the order of the provider performing the procedure. Total intra-service moderate sedation time: 26 minutes. Patient's level of consciousness  and vital signs were monitored continuously by radiology nurse throughout the procedure under the supervision of the provider performing the procedure. FLUOROSCOPY TIME:  Radiation Exposure Index (as provided by the fluoroscopic device): 2 mGy Kerma COMPLICATIONS: None immediate. PROCEDURE: Informed consent was obtained for placement of a tunneled dialysis catheter. The patient was placed supine on the interventional table. Ultrasound confirmed a patent right internal jugular vein. Ultrasound image obtained for documentation. The right neck and chest was prepped and draped in a sterile fashion. Maximal barrier sterile technique was utilized including caps, mask, sterile gowns, sterile gloves, sterile drape, hand hygiene and skin antiseptic. The existing right jugular catheter was removed with manual compression. The right neck was anesthetized with 1% lidocaine. A small incision was made with #11 blade scalpel. A 21 gauge needle directed into the right internal jugular vein with ultrasound guidance. A micropuncture dilator set was placed. A 19 cm tip to cuff Palindrome catheter was selected. The skin below the right clavicle was anesthetized and a small incision was made with an #11 blade scalpel. A subcutaneous tunnel was formed to the vein dermatotomy site. The catheter was brought through the tunnel. The vein dermatotomy site was dilated to accommodate a peel-away sheath. The catheter was placed through the peel-away sheath and directed into the central venous structures. The tip of the catheter was placed in the right atrium with fluoroscopy. Fluoroscopic images were obtained for documentation. Both lumens were found to aspirate and flush well. The proper amount of heparin was flushed in both lumens. The vein dermatotomy site was closed using a single layer of absorbable suture and Dermabond. The catheter was secured to the skin using Prolene suture. FINDINGS: Old right jugular catheter was removed. New jugular  catheter tip in the right atrium. IMPRESSION: Successful placement of a right jugular tunneled dialysis catheter using ultrasound and fluoroscopic guidance. Electronically Signed   By: Richarda Overlie M.D.   On: 05/17/2023 17:48   NM GASTRIC EMPTYING  Result Date: 05/11/2023 CLINICAL DATA:  Concern for gastroparesis. EXAM: NUCLEAR MEDICINE GASTRIC EMPTYING SCAN TECHNIQUE: After oral ingestion of radiolabeled meal, sequential abdominal images were obtained for 60 minutes. Residual percentage of activity remaining within the stomach was calculated at 60 minutes. Examination was terminated early due to patient emesis. RADIOPHARMACEUTICALS:  2.1 mCi Tc-13m sulfur colloid in standardized meal COMPARISON:  CT May 05, 2023 FINDINGS: Expected location of the stomach in the left upper quadrant. Ingested meal empties the stomach gradually over the course of the study with 60% retention at 60 min. IMPRESSION: Nondiagnostic study secondary to patient emesis. Electronically Signed   By: Maudry Mayhew M.D.   On: 05/11/2023 14:25   CT CHEST WO CONTRAST  Result Date: 05/09/2023 CLINICAL DATA:  Chronic dyspnea.  Chest pain. EXAM: CT CHEST WITHOUT CONTRAST TECHNIQUE: Multidetector CT imaging of the chest was performed following the standard protocol without IV contrast. RADIATION DOSE REDUCTION: This exam was performed according to the departmental dose-optimization program which includes automated exposure control, adjustment of the mA and/or kV according to patient size and/or use of iterative reconstruction technique. COMPARISON:  CT chest abdomen and pelvis 05/05/2023 FINDINGS: Cardiovascular: Heart is enlarged. There is no pericardial effusion. Aorta is normal in size. There are atherosclerotic  calcifications of the aorta and coronary arteries. Right-sided central venous catheter tip ends in the SVC. Mediastinum/Nodes: There is a subcentimeter left hypodense thyroid nodule. There are no enlarged mediastinal, hilar or axillary  lymph nodes. The esophagus is within normal limits. Lungs/Pleura: There are small bilateral pleural effusions which have decreased when compared to the prior examination. There are interstitial and patchy ground-glass opacities in both mid and lower lungs involving the lower lobes, right middle lobe and inferior left upper lobe. These have increased when compared to the prior examination. Trachea and central airways are patent. There is no pneumothorax. Upper Abdomen: No acute abnormality. Musculoskeletal: No chest wall mass or suspicious bone lesions identified. IMPRESSION: 1. Decreasing small bilateral pleural effusions. 2. Increasing interstitial and patchy ground-glass opacities in the mid and lower lungs. Findings are nonspecific and may be related to pulmonary edema or infection. 3. Stable cardiomegaly. 4. Subcentimeter left thyroid nodule. Not clinically significant; no follow-up imaging recommended (ref: J Am Coll Radiol. 2015 Feb;12(2): 143-50). Aortic Atherosclerosis (ICD10-I70.0). Electronically Signed   By: Darliss Cheney M.D.   On: 05/09/2023 23:37   DG CHEST PORT 1 VIEW  Result Date: 05/09/2023 CLINICAL DATA:  Shortness of breath EXAM: PORTABLE CHEST 1 VIEW COMPARISON:  Chest x-ray May 08, 2023 FINDINGS: No pneumothorax. Stable right central line. Mild opacity in left base is stable. Opacity in the right base is worsened in the interval. No change in cardiomegaly or cardiomediastinal silhouette. IMPRESSION: 1. Worsening opacity in the right base could represent developing pneumonia/infiltrate. Recommend attention on follow-up 2. Stable mild opacity in the left base. 3. No other changes. Electronically Signed   By: Gerome Sam III M.D.   On: 05/09/2023 10:29   DG Chest Port 1 View  Result Date: 05/08/2023 CLINICAL DATA:  Evaluate central line placement EXAM: PORTABLE CHEST 1 VIEW COMPARISON:  Chest x-ray May 06, 2023 FINDINGS: A new right central line has been placed terminating in the central  SVC. No pneumothorax. Effusion and opacity on the left is less pronounced. Effusion and opacity on the right is less pronounced as well. There is some opacity in the lateral right lung base. No other interval changes or acute abnormalities. IMPRESSION: 1. The new right central line terminates in the central SVC. No pneumothorax. 2. Effusions and opacities in the lung bases are less pronounced. There is some opacity in the lateral right lung base. Electronically Signed   By: Gerome Sam III M.D.   On: 05/08/2023 15:18   DG Chest Port 1 View  Result Date: 05/06/2023 CLINICAL DATA:  Respiratory complication. EXAM: PORTABLE CHEST 1 VIEW COMPARISON:  05/05/2023. FINDINGS: Heart is enlarged and the mediastinal contour stable. There is atherosclerotic calcification of the aorta. Patchy airspace disease is noted in the middle to lower lung fields bilaterally. Small to moderate pleural effusions are present bilaterally. No pneumothorax. IMPRESSION: 1. Stable patchy airspace disease in the mid to lower lung fields bilaterally, possible edema or infiltrate. 2. Small to moderate pleural effusions bilaterally. Electronically Signed   By: Thornell Sartorius M.D.   On: 05/06/2023 22:12   ECHOCARDIOGRAM COMPLETE  Result Date: 05/06/2023    ECHOCARDIOGRAM REPORT   Patient Name:   MAYETTA JEUNE Date of Exam: 05/06/2023 Medical Rec #:  [id number]           Height:       65.0 in Accession #:    [id number]          Weight:       156.8  lb Date of Birth:  [birth year]           BSA:          1.784 m Patient Age:    89 years            BP:           142/76 mmHg Patient Gender: F                   HR:           53 bpm. Exam Location:  Inpatient Procedure: 2D Echo, Cardiac Doppler and Color Doppler Indications:    CHF - Acute Diastolic  History:        Patient has prior history of Echocardiogram examinations, most                 recent 11/09/2021. Hx of Aortic root aneurysm, Acute respiratory                 failure, PAD and Stroke;  Risk Factors:Diabetes, Hypertension and                 Dyslipidemia.  Sonographer:    Wallie Char Referring Phys: Gomez Cleverly LAMA  Sonographer Comments: *Pt had reaction to Definity in the past* IMPRESSIONS  1. Left ventricular ejection fraction, by estimation, is 55 to 60%. The left ventricle has normal function. The left ventricle has no regional wall motion abnormalities. There is mild left ventricular hypertrophy. Left ventricular diastolic parameters are indeterminate.  2. Right ventricular systolic function is normal. The right ventricular size is normal. There is moderately elevated pulmonary artery systolic pressure. The estimated right ventricular systolic pressure is 46.4 mmHg.  3. The mitral valve is grossly normal. Mild mitral valve regurgitation. No evidence of mitral stenosis.  4. The aortic valve is tricuspid. There is mild calcification of the aortic valve. Aortic valve regurgitation is not visualized. No aortic stenosis is present.  5. The inferior vena cava is dilated in size with <50% respiratory variability, suggesting right atrial pressure of 15 mmHg.  6. Ascending aorta measurements are within normal limits for age when indexed to body surface area. FINDINGS  Left Ventricle: Left ventricular ejection fraction, by estimation, is 55 to 60%. The left ventricle has normal function. The left ventricle has no regional wall motion abnormalities. The left ventricular internal cavity size was normal in size. There is  mild left ventricular hypertrophy. Left ventricular diastolic parameters are indeterminate. Right Ventricle: The right ventricular size is normal. No increase in right ventricular wall thickness. Right ventricular systolic function is normal. There is moderately elevated pulmonary artery systolic pressure. The tricuspid regurgitant velocity is 2.80 m/s, and with an assumed right atrial pressure of 15 mmHg, the estimated right ventricular systolic pressure is 46.4 mmHg. Left Atrium:  Left atrial size was normal in size. Right Atrium: Right atrial size was normal in size. Pericardium: There is no evidence of pericardial effusion. Mitral Valve: The mitral valve is grossly normal. Mild mitral valve regurgitation. No evidence of mitral valve stenosis. MV peak gradient, 9.9 mmHg. The mean mitral valve gradient is 5.0 mmHg with average heart rate of 96 bpm. Tricuspid Valve: The tricuspid valve is normal in structure. Tricuspid valve regurgitation is trivial. No evidence of tricuspid stenosis. Aortic Valve: The aortic valve is tricuspid. There is mild calcification of the aortic valve. Aortic valve regurgitation is not visualized. No aortic stenosis is present. Aortic valve mean gradient measures 3.5 mmHg. Aortic valve peak  gradient measures 5.3 mmHg. Aortic valve area, by VTI measures 2.13 cm. Pulmonic Valve: The pulmonic valve was normal in structure. Pulmonic valve regurgitation is not visualized. No evidence of pulmonic stenosis. Aorta: The aortic root is normal in size and structure. Ascending aorta measurements are within normal limits for age when indexed to body surface area. Venous: The inferior vena cava is dilated in size with less than 50% respiratory variability, suggesting right atrial pressure of 15 mmHg. IAS/Shunts: The interatrial septum was not well visualized.  LEFT VENTRICLE PLAX 2D LVIDd:         5.10 cm     Diastology LVIDs:         3.70 cm     LV e' medial:    7.27 cm/s LV PW:         1.00 cm     LV E/e' medial:  20.4 LV IVS:        1.20 cm     LV e' lateral:   9.98 cm/s LVOT diam:     1.80 cm     LV E/e' lateral: 14.8 LV SV:         51 LV SV Index:   29 LVOT Area:     2.54 cm  LV Volumes (MOD) LV vol d, MOD A2C: 72.6 ml LV vol d, MOD A4C: 98.3 ml LV vol s, MOD A2C: 33.3 ml LV vol s, MOD A4C: 48.4 ml LV SV MOD A2C:     39.3 ml LV SV MOD A4C:     98.3 ml LV SV MOD BP:      43.6 ml RIGHT VENTRICLE             IVC RV Basal diam:  3.90 cm     IVC diam: 2.10 cm RV S prime:     13.10  cm/s TAPSE (M-mode): 2.0 cm LEFT ATRIUM             Index        RIGHT ATRIUM           Index LA diam:        4.00 cm 2.24 cm/m   RA Area:     15.60 cm LA Vol (A2C):   49.5 ml 27.75 ml/m  RA Volume:   38.20 ml  21.42 ml/m LA Vol (A4C):   45.8 ml 25.68 ml/m LA Biplane Vol: 48.2 ml 27.02 ml/m  AORTIC VALVE AV Area (Vmax):    2.17 cm AV Area (Vmean):   2.11 cm AV Area (VTI):     2.13 cm AV Vmax:           115.00 cm/s AV Vmean:          87.200 cm/s AV VTI:            0.240 m AV Peak Grad:      5.3 mmHg AV Mean Grad:      3.5 mmHg LVOT Vmax:         98.05 cm/s LVOT Vmean:        72.300 cm/s LVOT VTI:          0.201 m LVOT/AV VTI ratio: 0.84  AORTA Ao Root diam: 3.50 cm Ao Asc diam:  3.80 cm MITRAL VALVE                  TRICUSPID VALVE MV Area (PHT): 5.31 cm       TR Peak grad:   31.4 mmHg MV Area VTI:  1.62 cm       TR Vmax:        280.00 cm/s MV Peak grad:  9.9 mmHg MV Mean grad:  5.0 mmHg       SHUNTS MV Vmax:       1.57 m/s       Systemic VTI:  0.20 m MV Vmean:      105.0 cm/s     Systemic Diam: 1.80 cm MV Decel Time: 143 msec MR Peak grad:    127.2 mmHg MR Mean grad:    83.0 mmHg MR Vmax:         564.00 cm/s MR Vmean:        424.0 cm/s MR PISA:         4.02 cm MR PISA Eff ROA: 22 mm MR PISA Radius:  0.80 cm MV E velocity: 148.00 cm/s MV A velocity: 121.00 cm/s MV E/A ratio:  1.22 Weston Brass MD Electronically signed by Weston Brass MD Signature Date/Time: 05/06/2023/2:39:59 PM    Final    US RENAL  Result Date: 05/05/2023 CLINICAL DATA:  Chronic kidney disease. EXAM: RENAL / URINARY TRACT ULTRASOUND COMPLETE COMPARISON:  CT of the chest, abdomen, pelvis on 05/05/2023 FINDINGS: Right Kidney: Renal measurements: 10.5 x 4.2 x 5.5 centimeters = volume: 125.1 mL. There is narrowing of the renal cortex. Normal renal echogenicity. No hydronephrosis or suspicious renal mass. Left Kidney: Renal measurements: 9.2 x 4.3 x 3.6 centimeters = volume: 13.8 mL. There is renal parenchymal thinning. Normal renal  echogenicity. No hydronephrosis or suspicious renal mass. Bladder: Appears normal for degree of bladder distention. Other: None. IMPRESSION: No hydronephrosis. Renal parenchymal thinning bilaterally. Electronically Signed   By: Norva Pavlov M.D.   On: 05/05/2023 18:49   CT CHEST ABDOMEN PELVIS WO CONTRAST  Result Date: 05/05/2023 CLINICAL DATA:  Sepsis.  Increasing O2 requirements. EXAM: CT CHEST, ABDOMEN AND PELVIS WITHOUT CONTRAST TECHNIQUE: Multidetector CT imaging of the chest, abdomen and pelvis was performed following the standard protocol without IV contrast. RADIATION DOSE REDUCTION: This exam was performed according to the departmental dose-optimization program which includes automated exposure control, adjustment of the mA and/or kV according to patient size and/or use of iterative reconstruction technique. COMPARISON:  Chest x-ray earlier 05/05/2023. Ultrasound abdomen 05/05/2023. CT scan 04/14/2023. FINDINGS: CT CHEST FINDINGS Cardiovascular: Heart is nonenlarged. Trace pericardial fluid or thickening. Coronary artery calcifications are seen. The thoracic aorta has a normal course and caliber with scattered vascular calcifications. Bovine type aortic arch. Significant calcified plaque of the left subclavian artery. Mediastinum/Nodes: Small hiatal hernia. Normal caliber thoracic esophagus. Heterogeneous small thyroid gland. No specific abnormal lymph node enlargement seen in the axillary region or hila on this noncontrast exam. There are some prominent less than 1 cm in size in short axis mediastinal nodes, nonpathologic by size criteria. Example right of midline, precarinal measures 8 mm in short axis on series 2, image 23. These nodes are slightly more numerous than usually seen however. Lungs/Pleura: Developing small to moderate bilateral pleural effusions, right-greater-than-left. Adjacent parenchymal opacities in the lower lobes. Consolidation is possible. There are some patchy ground-glass  and interstitial changes seen along the upper lung zones with thickening of the interstitial septa. Please correlate for a component of edema. No pneumothorax. Musculoskeletal: Mild degenerative changes along the spine. CT ABDOMEN PELVIS FINDINGS Hepatobiliary: Dilated gallbladder with some dependent stones. This was seen previously. Please correlate with separate ultrasound from 05/05/2023 earlier preserved hepatic parenchyma on this noncontrast examination otherwise. Pancreas: Moderate atrophy of  the pancreas.  No obvious mass. Spleen: Splenic granulomas. Adrenals/Urinary Tract: The adrenal glands are preserved. Mild bilateral renal atrophy with some perinephric stranding. Multiple calcifications along each kidney could be vascular rather than collecting system. No collecting system dilatation. No abnormal calcification seen within either ureter. Bladder is underdistended. There are low-lying pelvic structures. Please correlate for prolapse. Stomach/Bowel: On this non oral contrast exam, the large bowel has a normal course and caliber with scattered colonic stool. The stomach and small bowel are nondilated. Normal appendix in the right lower quadrant extending inferior to the cecum. Vascular/Lymphatic: Diffuse vascular calcifications identified particularly of branch vessels. Normal caliber aorta and IVC. No specific abnormal lymph node enlargement identified in the abdomen and pelvis. Reproductive: Status post hysterectomy. No adnexal masses. Other: Small focal wide mouth hernia seen along the anterior pelvic wall in the right side lateral to the margin of the right rectus abdominis muscle containing a loop of small bowel. No obstruction. This is unchanged from previous. Small fat containing umbilical hernia. Musculoskeletal: Scattered degenerative changes seen of the spine and pelvis. IMPRESSION: New bilateral pleural effusions, right-greater-than-left with the adjacent parenchymal opacities. Infiltrates are  possible and recommend follow-up. There is also a component of interstitial septal thickening which is new. A component of edema is possible. No bowel obstruction, free air or free fluid with scattered stool. No obstructing renal stones.  Mild bilateral renal atrophy. Dilated gallbladder with a dependent stone. No adjacent inflammatory changes by noncontrast CT. Please correlate with the prior ultrasound. If there is further concern, HIDA scan may be of some benefit. Significant diffuse vascular calcifications. 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 Please correlate with any particular symptoms. Electronically Signed   By: Karen Kays M.D.   On: 05/05/2023 14:58   US Abdomen Limited RUQ (LIVER/GB)  Result Date: 05/05/2023 CLINICAL DATA:  Right upper quadrant pain for several weeks. EXAM: ULTRASOUND ABDOMEN LIMITED RIGHT UPPER QUADRANT COMPARISON:  04/14/2023 FINDINGS: Gallbladder: Gallbladder appears distended, similar to CT from 04/14/2023. Multiple stones are identified measuring up to 8 mm. No gallbladder wall thickening, pericholecystic fluid, or sludge. Negative sonographic Murphy's sign (note: patient received pain medication prior to ultrasound). Common bile duct: Diameter: 5.6 mm Liver: No focal lesion identified. Mild increased parenchymal echogenicity. Portal vein is patent on color Doppler imaging with normal direction of blood flow towards the liver. Other: None. IMPRESSION: 1. Gallstones. No gallbladder wall thickening, pericholecystic fluid, or sludge. Negative sonographic Murphy's sign (note: patient received pain medication prior to ultrasound). 2. Mild increased hepatic parenchymal echogenicity suggestive of steatosis. Electronically Signed   By: Signa Kell M.D.   On: 05/05/2023 11:56   DG Chest Port 1 View  Result Date: 05/05/2023 CLINICAL DATA:  Generalized chest pain, shortness of breath, and cough EXAM: PORTABLE CHEST 1 VIEW COMPARISON:   Chest radiograph dated 04/17/2023 FINDINGS: Low lung volumes with bronchovascular crowding. Bilateral perihilar interstitial opacities and bibasilar patchy opacities. Increased moderate bilateral pleural effusions. No pneumothorax. The heart size and mediastinal contours are within normal limits. No acute osseous abnormality. IMPRESSION: 1. Pulmonary edema with increased moderate bilateral pleural effusions. 2. Bibasilar patchy opacities, likely atelectasis. Aspiration or pneumonia can be considered in the appropriate clinical setting. Electronically Signed   By: Agustin Cree M.D.   On: 05/05/2023 11:38     TODAY-DAY OF DISCHARGE:  Subjective:   Samantha [name] today has no headache,no chest abdominal pain,no new weakness tingling or numbness, feels much better wants to  go home today.   Objective:   Blood pressure (!) 106/54, pulse 78, temperature 98.2 F (36.8 C), temperature source Oral, resp. rate 16, height 5\' 5"  (1.651 m), weight 60 kg, SpO2 97 %.  Intake/Output Summary (Last 24 hours) at 05/28/2023 1200 Last data filed at 05/28/2023 0600 Gross per 24 hour  Intake 745.03 ml  Output 1380 ml  Net -634.97 ml   Filed Weights   05/27/23 0749 05/27/23 0750 05/27/23 1201  Weight: 61.1 kg 61.1 kg 60 kg    Exam: Awake Alert, Oriented *3, No new F.N deficits, Normal affect Center Line.AT,PERRAL Supple Neck,No JVD, No cervical lymphadenopathy appriciated.  Symmetrical Chest wall movement, Good air movement bilaterally, CTAB RRR,No Gallops,Rubs or new Murmurs, No Parasternal Heave +ve B.Sounds, Abd Soft, Non tender, No organomegaly appriciated, No rebound -guarding or rigidity. No Cyanosis, Clubbing or edema, No new Rash or bruise   PERTINENT RADIOLOGIC STUDIES: No results found.   PERTINENT LAB RESULTS: CBC: Recent Labs    05/27/23 0443  WBC 8.1  HGB 10.4*  HCT 29.7*  PLT 351   CMET CMP     Component Value Date/Time   NA 124 (L) 05/27/2023 0443   NA 140 04/12/2023 1702   K 4.1  05/27/2023 0443   CL 84 (L) 05/27/2023 0443   CO2 23 05/27/2023 0443   GLUCOSE 104 (H) 05/27/2023 0443   BUN 58 (H) 05/27/2023 0443   BUN 39 (H) 04/12/2023 1702   CREATININE 6.48 (H) 05/27/2023 0443   CREATININE 1.02 (H) 12/23/2016 0940   CALCIUM 8.8 (L) 05/27/2023 0443   CALCIUM 9.0 05/25/2023 0218   PROT 7.7 05/23/2023 0354   PROT 6.8 04/12/2023 1702   ALBUMIN 2.3 (L) 05/27/2023 0443   ALBUMIN 3.9 04/12/2023 1702   AST 68 (H) 05/23/2023 0354   ALT 24 05/23/2023 0354   ALKPHOS 153 (H) 05/23/2023 0354   BILITOT 1.0 05/23/2023 0354   BILITOT <0.2 04/12/2023 1702   GFRNONAA 6 (L) 05/27/2023 0443   GFRNONAA 59 (L) 12/23/2016 0940   GFRAA 47 (L) 09/13/2018 1002   GFRAA 68 12/23/2016 0940    GFR Estimated Creatinine Clearance: 7.4 mL/min (A) (by C-G formula based on SCr of 6.48 mg/dL (H)). No results for input(s): "LIPASE", "AMYLASE" in the last 72 hours. No results for input(s): "CKTOTAL", "CKMB", "CKMBINDEX", "TROPONINI" in the last 72 hours. Invalid input(s): "POCBNP" No results for input(s): "DDIMER" in the last 72 hours. No results for input(s): "HGBA1C" in the last 72 hours. No results for input(s): "CHOL", "HDL", "LDLCALC", "TRIG", "CHOLHDL", "LDLDIRECT" in the last 72 hours. No results for input(s): "TSH", "T4TOTAL", "T3FREE", "THYROIDAB" in the last 72 hours.  Invalid input(s): "FREET3" No results for input(s): "VITAMINB12", "FOLATE", "FERRITIN", "TIBC", "IRON", "RETICCTPCT" in the last 72 hours. Coags: No results for input(s): "INR" in the last 72 hours.  Invalid input(s): "PT" Microbiology: Recent Results (from the past 240 hour(s))  Culture, blood (Routine X 2) w Reflex to ID Panel     Status: None   Collection Time: 05/19/23  6:06 PM   Specimen: BLOOD LEFT ARM  Result Value Ref Range Status   Specimen Description BLOOD LEFT ARM  Final   Special Requests   Final    BOTTLES DRAWN AEROBIC AND ANAEROBIC Blood Culture adequate volume   Culture   Final    NO  GROWTH 5 DAYS Performed at [facility], [address] [place], [place] [address]    Report Status 05/24/2023 FINAL  Final  Culture,  blood (Routine X 2) w Reflex to ID Panel     Status: None   Collection Time: 05/19/23  6:06 PM   Specimen: BLOOD LEFT ARM  Result Value Ref Range Status   Specimen Description BLOOD LEFT ARM  Final   Special Requests   Final    BOTTLES DRAWN AEROBIC AND ANAEROBIC Blood Culture adequate volume   Culture   Final    NO GROWTH 5 DAYS Performed at [facility], [address], [place], [place] [address]    Report Status 05/24/2023 FINAL  Final  Aerobic/Anaerobic Culture w Gram Stain (surgical/deep wound)     Status: None   Collection Time: 05/23/23 11:27 AM   Specimen: BILE  Result Value Ref Range Status   Specimen Description BILE  Final   Special Requests NONE  Final   Gram Stain NO WBC SEEN NO ORGANISMS SEEN   Final   Culture   Final    No growth aerobically or anaerobically. Performed at [facility], [address], [place], [place] [address]    Report Status 05/28/2023 FINAL  Final    FURTHER DISCHARGE INSTRUCTIONS:  Get Medicines reviewed and adjusted: Please take all your medications with you for your next visit with your Primary MD  Laboratory/radiological data: Please request your Primary MD to go over all hospital tests and procedure/radiological results at the follow up, please ask your Primary MD to get all Hospital records sent to his/her office.  In some cases, they will be blood work, cultures and biopsy results pending at the time of your discharge. Please request that your primary care M.D. goes through all the records of your hospital data and follows up on these results.  Also Note the following: If you experience worsening of your admission symptoms, develop shortness of breath, life threatening emergency, suicidal or homicidal thoughts you must seek medical attention immediately by calling 911 or calling  your MD immediately  if symptoms less severe.  You must read complete instructions/literature along with all the possible adverse reactions/side effects for all the Medicines you take and that have been prescribed to you. Take any new Medicines after you have completely understood and accpet all the possible adverse reactions/side effects.   Do not drive when taking Pain medications or sleeping medications (Benzodaizepines)  Do not take more than prescribed Pain, Sleep and Anxiety Medications. It is not advisable to combine anxiety,sleep and pain medications without talking with your primary care practitioner  Special Instructions: If you have smoked or chewed Tobacco  in the last 2 yrs please stop smoking, stop any regular Alcohol  and or any Recreational drug use.  Wear Seat belts while driving.  Please note: You were cared for by a hospitalist during your hospital stay. Once you are discharged, your primary care physician will handle any further medical issues. Please note that NO REFILLS for any discharge medications will be authorized once you are discharged, as it is imperative that you return to your primary care physician (or establish a relationship with a primary care physician if you do not have one) for your post hospital discharge needs so that they can reassess your need for medications and monitor your lab values.  Total Time spent coordinating discharge including counseling, education and face to face time equals greater than 30 minutes.  Signed: Kla Bily 05/28/2023 12:00 PM

## 2023-05-28 NOTE — Progress Notes (Signed)
PMR Admission Coordinator Pre-Admission Assessment   Patient: Samantha [name] is an 72 y.o., female MRN: [id number] DOB: [birth year] Height: 5\' 5"  (165.1 cm) Weight: 60 kg   Insurance Information HMO:     PPO:      PCP:      IPA:      80/20:      OTHER:  PRIMARY:  [employer] [facility] [place]     Policy#: [id number], Medicaid #: [id number]      Subscriber: patient CM Name:                                        P: [phone]                      F: [phone] Pre-Cert#: [id number]  approved from 05/28/23 to 06/03/23     Employer: Not employed Benefits:  Phone #: [phone]    Name: checked on line portal Eff. Date:  Effective: 04/28/2023 to 03/27/2024     Deduct:   0    Out of Pocket Max: 0        Life Max: 0 CIR: 100%      SNF: 100% Outpatient: 100%     Co-Pay: 0 Home Health: 100%      Co-Pay: 0 DME: 100%     Co-Pay: 0 Providers: in network    SECONDARY:       Policy#:      Phone#:    Artist:       Phone#:    The Data processing manager" for patients in Inpatient Rehabilitation Facilities with attached "Privacy Act Statement-Health Care Records" was provided and verbally reviewed with: n/a    Emergency Contact Information Contact Information       Name Relation Home Work Mobile    [place] Daughter [phone]   [phone]    [name] Daughter     [phone]           Current Medical History  Patient Admitting Diagnosis: Respiratory Failure, Renal Failure, L cerebellar CVA              History of Present Illness: Samantha [name]  is a 72 y.o. female, with medical history of PAD, hypertension, diabetes mellitus type 2, insulin-dependent, history of CVA, depression, anxiety who was just discharged from the hospital on 4/21 after she was treated for UTI/pyelonephritis, pulmonary edema.  Patient was discharged on Lasix 40 mg p.o. daily for 7 days. On 05/04/27, Pt. developed worsening abdominal discomfort and also developed chest  discomfort and returned to the [facility] Emergency Department on 05/05/23. In the ED CT chest abdomen/pelvis was done which showed pulmonary edema versus bibasilar infiltrates.  BNP was elevated to 600.  Initially patient received IV fluid bolus LR 1 L.  Oxygen requirement went up to 4.5 L/min.Lab work also showed mild elevation of troponin 49, 51, creatinine 2.34, improved from previous creatinine from 4/21 which was 2.83. She was admitted with Acute hypoxemic respiratory failure-insetting of pulmonary edema, bilateral pleural effusions.  On 05/05/24 Echo showed EF of 55-60%.  CT chest on 05/09/23 showed increasing interstitial/patchy groundglass opacities-decreasing small bilateral pleural effusions.  On 05/22/23 MRA of the brain showed  Intracranial atherosclerosis including severe left M2/right A2 stenosis, moderate left V4, basilar and PCA stenosis and MRI showed punctate acute left cerebellar infarct. LTM EEG: No seizures. During admission, pt. Progressed to ESRD and  pt. Was placed on HD (tunneled catheter placed by IR 05/17/23). She has been clipped for outpatient HD, on TTS schedule. Had low-grade fever on 5/22 and subsequently worsening leukocytosis/RUQ pain on 5/24-HIDA scan positive-evaluated by general surgery-Plavix held-underwent cholecystostomy tube placement by IR on 5/26. Pt. Was initially on Zosyn-should be able to transition to Augmentin with plan on total of 7 days of treatment.  Pt. Seen by PT/OT who feel Pt. Would benefit from CIR to assist return to PLOF.    Complete NIHSS TOTAL: 10   Patient's medical record from [facility] has been reviewed by the rehabilitation admission coordinator and physician.   Past Medical History      Past Medical History:  Diagnosis Date   Acute pyelonephritis     Bacteremia, escherichia coli 01/12/2015   Chest pain     Critical lower limb ischemia (HCC)     Hyperlipidemia     Hypertension     Left carotid bruit     PAD (peripheral  artery disease) (HCC)     Type II diabetes mellitus (HCC)        Has the patient had major surgery during 100 days prior to admission? Yes   Family History   family history includes Hypertension in her mother.   Current Medications   Current Facility-Administered Medications:    0.9 %  sodium chloride infusion, 250 mL, Intravenous, PRN, Meredeth Ide, MD, Stopped at 05/09/23 2241   acetaminophen (TYLENOL) tablet 650 mg, 650 mg, Oral, Q6H PRN, Maretta Bees, MD, 650 mg at 05/27/23 1246   albuterol (PROVENTIL) (2.5 MG/3ML) 0.083% nebulizer solution 2.5 mg, 2.5 mg, Nebulization, Q4H PRN, Sharl Ma, Sarina Ill, MD   amoxicillin-clavulanate (AUGMENTIN) 500-125 MG per tablet 1 tablet, 1 tablet, Oral, BID, Pham, Minh Q, RPH-CPP, 1 tablet at 05/28/23 0847   atorvastatin (LIPITOR) tablet 80 mg, 80 mg, Oral, Daily, Luciano Cutter, MD, 80 mg at 05/28/23 0912   bisacodyl (DULCOLAX) suppository 10 mg, 10 mg, Rectal, Daily, Pokhrel, Laxman, MD, 10 mg at 05/24/23 0825   carvedilol (COREG) tablet 6.25 mg, 6.25 mg, Oral, BID WC, Elgergawy, Leana Roe, MD, 6.25 mg at 05/28/23 0846   Chlorhexidine Gluconate Cloth 2 % PADS 6 each, 6 each, Topical, Q0600, Annie Sable, MD, 6 each at 05/28/23 0537   Chlorhexidine Gluconate Cloth 2 % PADS 6 each, 6 each, Topical, Q0600, Annie Sable, MD, 6 each at 05/28/23 0537   Chlorhexidine Gluconate Cloth 2 % PADS 6 each, 6 each, Topical, Q0600, Annie Sable, MD   clopidogrel (PLAVIX) tablet 75 mg, 75 mg, Oral, Daily, Ghimire, Werner Lean, MD, 75 mg at 05/28/23 1308   diphenhydrAMINE (BENADRYL) injection 25 mg, 25 mg, Intravenous, Once, [place], Sarina Ill, MD   docusate sodium (COLACE) capsule 100 mg, 100 mg, Oral, BID, Pokhrel, Laxman, MD, 100 mg at 05/28/23 0913   ferric gluconate (FERRLECIT) 250 mg in sodium chloride 0.9 % 250 mL IVPB, 250 mg, Intravenous, Daily, Annie Sable, MD, Last Rate: 135 mL/hr at 05/28/23 0927, 250 mg at 05/28/23 0927    fluticasone (FLONASE) 50 MCG/ACT nasal spray 2 spray, 2 spray, Each Nare, Daily, Meredeth Ide, MD, 2 spray at 05/24/23 0826   guaiFENesin (MUCINEX) 12 hr tablet 600 mg, 600 mg, Oral, BID, Sharl Ma, Gagan S, MD, 600 mg at 05/28/23 0912   heparin injection 5,000 Units, 5,000 Units, Subcutaneous, Q8H, MikolichekLisabeth Pick, RPH, 5,000 Units at 05/28/23 0536   HYDROmorphone (DILAUDID) injection 0.5 mg, 0.5 mg, Intravenous, Q4H  PRN, Maretta Bees, MD, 0.5 mg at 05/23/23 1741   insulin aspart (novoLOG) injection 0-15 Units, 0-15 Units, Subcutaneous, TID WC, Pokhrel, Laxman, MD, 2 Units at 05/28/23 0847   insulin aspart (novoLOG) injection 0-5 Units, 0-5 Units, Subcutaneous, QHS, Pokhrel, Laxman, MD, 3 Units at 05/17/23 2044   insulin aspart (novoLOG) injection 3 Units, 3 Units, Subcutaneous, TID WC, Elgergawy, Leana Roe, MD, 3 Units at 05/28/23 0847   insulin glargine-yfgn (SEMGLEE) injection 15 Units, 15 Units, Subcutaneous, Daily, Elgergawy, Leana Roe, MD, 15 Units at 05/28/23 0920   iohexol (OMNIPAQUE) 300 MG/ML solution 50 mL, 50 mL, Per Tube, Once PRN, Sterling Big, MD   LORazepam (ATIVAN) tablet 0.5 mg, 0.5 mg, Oral, Q6H PRN, Howerter, Justin B, DO   melatonin tablet 3 mg, 3 mg, Oral, QHS PRN, Howerter, Justin B, DO   ondansetron (ZOFRAN) tablet 4 mg, 4 mg, Oral, Q6H PRN, 4 mg at 05/18/23 0854 **OR** ondansetron (ZOFRAN) injection 4 mg, 4 mg, Intravenous, Q6H PRN, Sharl Ma, Sarina Ill, MD, 4 mg at 05/27/23 1433   Oral care mouth rinse, 15 mL, Mouth Rinse, PRN, Elgergawy, Leana Roe, MD   oxyCODONE (Oxy IR/ROXICODONE) immediate release tablet 5 mg, 5 mg, Oral, Q4H PRN, Pokhrel, Laxman, MD, 5 mg at 05/25/23 1352   oxymetazoline (AFRIN) 0.05 % nasal spray 1 spray, 1 spray, Each Nare, Once, [place], Sarina Ill, MD   polyethylene glycol (MIRALAX / GLYCOLAX) packet 17 g, 17 g, Oral, BID, Juliet Rude, PA-C, 17 g at 05/28/23 1610   sevelamer carbonate (RENVELA) tablet 1,600 mg, 1,600 mg, Oral, TID WC,  Annie Sable, MD, 1,600 mg at 05/28/23 0847   sodium chloride (OCEAN) 0.65 % nasal spray 1 spray, 1 spray, Each Nare, PRN, Sharl Ma, Sarina Ill, MD   sodium chloride flush (NS) 0.9 % injection 3 mL, 3 mL, Intravenous, Q12H, Sharl Ma, Gagan S, MD, 3 mL at 05/28/23 0912   sodium chloride flush (NS) 0.9 % injection 3 mL, 3 mL, Intravenous, PRN, Sharl Ma, Sarina Ill, MD   Patients Current Diet:  Diet Order                  Diet - low sodium heart healthy             Diet Carb Modified             Diet renal/carb modified with fluid restriction Diet-HS Snack? Nothing; Fluid restriction: 1200 mL Fluid; Room service appropriate? Yes; Fluid consistency: Thin  Diet effective now                         Precautions / Restrictions Precautions Precautions: Fall Precaution Comments: denies falls in past 6 months Restrictions Weight Bearing Restrictions: No    Has the patient had 2 or more falls or a fall with injury in the past year? No   Prior Activity Level Community (5-7x/wk): Pt. active in the community PTA   Prior Functional Level Self Care: Did the patient need help bathing, dressing, using the toilet or eating? Independent   Indoor Mobility: Did the patient need assistance with walking from room to room (with or without device)? Independent   Stairs: Did the patient need assistance with internal or external stairs (with or without device)? Independent   Functional Cognition: Did the patient need help planning regular tasks such as shopping or remembering to take medications? Independent   Patient Information Are you of Hispanic, Latino/a,or Spanish origin?: B. Yes, [place]  American, Chicano/a What is your race?: Z. None of the above Do you need or want an interpreter to communicate with a doctor or health care staff?: 0. Yes   Patient's Response To:  Health Literacy and Transportation Is the patient able to respond to health literacy and transportation needs?: Yes Health  Literacy - How often do you need to have someone help you when you read instructions, pamphlets, or other written material from your doctor or pharmacy?: Sometimes In the past 12 months, has lack of transportation kept you from medical appointments or from getting medications?: No In the past 12 months, has lack of transportation kept you from meetings, work, or from getting things needed for daily living?: No   Journalist, newspaper / Equipment Home Assistive Devices/Equipment: None Home Equipment: Agricultural consultant (2 wheels), BSC/3in1   Prior Device Use: Indicate devices/aids used by the patient prior to current illness, exacerbation or injury? None of the above   Current Functional Level Cognition   Overall Cognitive Status: No family/caregiver present to determine baseline cognitive functioning Orientation Level: Oriented X4 General Comments: pt requiring encouragement and education on participation.    Extremity Assessment (includes Sensation/Coordination)   Upper Extremity Assessment: Generalized weakness  Lower Extremity Assessment: Defer to PT evaluation     ADLs   Overall ADL's : Needs assistance/impaired Eating/Feeding: Modified independent Grooming: Set up, Min guard, Sitting (Decreased sitting balance/tolerance) Upper Body Bathing: Minimal assistance, Moderate assistance, Sitting (with extra time; decreased sitting balance/tolerance) Lower Body Bathing: Moderate assistance, Cueing for compensatory techniques, Sit to/from stand (decreased standing and sitting balance/tolerance) Upper Body Dressing : Min guard, Sitting (decreased sitting balance/tolerance) Lower Body Dressing: Minimal assistance, Moderate assistance, Cueing for compensatory techniques, Sit to/from stand (Min to Mod assist to maintain balance during stand) Toilet Transfer: Minimal assistance, Moderate assistance, BSC/3in1 (step pivot) Toileting- Clothing Manipulation and Hygiene: Maximal assistance, Sit to/from  stand, Minimal assistance (Min assist follwoing urination, Max assist following BM) General ADL Comments: Pt declined funcitonal mobility attempt this session.     Mobility   Overal bed mobility: Needs Assistance Bed Mobility: Supine to Sit, Sit to Supine Supine to sit: Min assist, HOB elevated Sit to supine: Min assist General bed mobility comments: pt long sitting in bed upon arrival, attempted pivoting to EOB and further mobility but pt declining, assisted in posterior scooting back into bed and with pillow placement for back pain, HOB elevated to allow upright posture in bed     Transfers   Overall transfer level: Needs assistance Equipment used: Rolling walker (2 wheels) Transfers: Sit to/from Stand, Bed to chair/wheelchair/BSC Sit to Stand: Min assist, Mod assist Bed to/from chair/wheelchair/BSC transfer type:: Step pivot Step pivot transfers: Mod assist General transfer comment: pt declining all attempts at OOB mobility today due to fatigue, pain, and nausea     Ambulation / Gait / Stairs / Wheelchair Mobility   Ambulation/Gait Ambulation/Gait assistance: Editor, commissioning (Feet): 3 Feet Assistive device: Rolling walker (2 wheels) Gait Pattern/deviations: Step-through pattern, Step-to pattern General Gait Details: pt declining     Posture / Balance Balance Overall balance assessment: Needs assistance Sitting-balance support: Single extremity supported, Bilateral upper extremity supported, Feet supported Sitting balance-Leahy Scale: Fair Postural control: Posterior lean Standing balance support: Bilateral upper extremity supported, During functional activity, Reliant on assistive device for balance Standing balance-Leahy Scale: Poor Standing balance comment: requiring physical assist and BUE support during dynamic tasks     Special needs/care consideration Dialysis: Hemodialysis Tuesday, Thursday, and Saturday  Previous Home Environment (from acute therapy  documentation) Living Arrangements: Children Available Help at Discharge: Family, Available 24 hours/day Type of Home: House Home Layout: One level Home Access: Stairs to enter Entrance Stairs-Rails: Left Entrance Stairs-Number of Steps: 3 Bathroom Shower/Tub: Engineer, manufacturing systems: Standard Home Care Services: No Additional Comments: lives with daughter   Discharge Living Setting Plans for Discharge Living Setting: Patient's home Type of Home at Discharge: House Discharge Home Layout: One level Discharge Home Access: Stairs to enter Entrance Stairs-Rails: Left Entrance Stairs-Number of Steps: 3 Discharge Bathroom Shower/Tub: Tub/shower unit Discharge Bathroom Toilet: Standard Discharge Bathroom Accessibility: No Does the patient have any problems obtaining your medications?: No   Social/Family/Support Systems Patient Roles: Other (Comment) Contact Information: [name] (daughter) Anticipated Caregiver: [phone] Anticipated Caregiver's Contact Information: Daughter works but will leave her job if her mother needs 24/7 care. Can provide min A Ability/Limitations of Caregiver: 24/7 if needed, hopeful to d/c mod I Caregiver Availability: 24/7 Discharge Plan Discussed with Primary Caregiver: Yes Is Caregiver In Agreement with Plan?: Yes Does Caregiver/Family have Issues with Lodging/Transportation while Pt is in Rehab?: No   Goals Patient/Family Goal for Rehab: PT/OT Mod I Expected length of stay: 7-10 days Pt/Family Agrees to Admission and willing to participate: Yes Program Orientation Provided & Reviewed with Pt/Caregiver Including Roles  & Responsibilities: Yes   Decrease burden of Care through IP rehab admission: not anticipated   Possible need for SNF placement upon discharge: not anticipated   Patient Condition: I have reviewed medical records from [facility] , spoken with CM, and patient and daughter. I met with patient at the  bedside for inpatient rehabilitation assessment.  Patient will benefit from ongoing PT and OT, can actively participate in 3 hours of therapy a day 5 days of the week, and can make measurable gains during the admission.  Patient will also benefit from the coordinated team approach during an Inpatient Acute Rehabilitation admission.  The patient will receive intensive therapy as well as Rehabilitation physician, nursing, social worker, and care management interventions.  Due to bladder management, bowel management, safety, skin/wound care, disease management, medication administration, pain management, and patient education the patient requires 24 hour a day rehabilitation nursing.  The patient is currently min A with mobility and basic ADLs.  Discharge setting and therapy post discharge at home with home health is anticipated.  Patient has agreed to participate in the Acute Inpatient Rehabilitation Program and will admit today.   Preadmission Screen Completed By:  Trish Mage, 05/28/2023 12:38 PM ______________________________________________________________________   Discussed status with Dr. Shearon Stalls on 05/28/23 at 12:00 and received approval for admission today.   Admission Coordinator:  Trish Mage, RN, time 12:14/Date 05/28/23    Assessment/Plan: Diagnosis: Does the need for close, 24 hr/day Medical supervision in concert with the patient's rehab needs make it unreasonable for this patient to be served in a less intensive setting? 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 Yes Does the patient require coordinated care of a physician, rehab nurse, PT, OT to address  physical and functional deficits in the context of the above medical diagnosis(es)? Yes Addressing deficits in the following areas: balance, endurance, locomotion, strength, transferring, bathing, dressing, feeding, grooming, and toileting Can the patient actively participate  in an intensive therapy program of at least 3 hrs of therapy 5 days a week? Yes The potential for patient to make measurable gains while on inpatient rehab is good Anticipated functional outcomes upon discharge from inpatient rehab: modified independent PT, modified independent OT, Estimated rehab length of stay to reach the above functional goals is: 7-10 days Anticipated discharge destination: Home 10. Overall Rehab/Functional Prognosis: good     MD Signature:   Angelina Sheriff, DO 05/28/2023           Revision History

## 2023-05-28 NOTE — Progress Notes (Signed)
PROGRESS NOTE        PATIENT DETAILS Name: Samantha [name] Age: 72 y.o. Sex: female Date of Birth: 04-09-1951 Admit Date: 05/05/2023 Admitting Physician Meredeth Ide, MD ZOX:[id number], Odette Horns, MD  Brief Summary: Patient is a 72 y.o.  female with history of HTN, DM-2, PAD, CVA, depression/anxiety-who presented with acute hypoxic respiratory failure in the setting of pulm edema with worsening renal function.  Unfortunately-thought to have progressed to ESRD-requiring HD, for the hospital course complicated by development of acute metabolic encephalopathy, and acute calculus cholecystitis.    Significant events: 5/08>> admit to [facility]  Significant studies: 5/08>> CT chest/abdomen/pelvis: New bilateral pleural effusions, right> left parenchymal opacities 5/08>> renal ultrasound: No hydronephrosis 5/09>>Echo: EF 55-60% 5/12>> CT chest: Increasing interstitial/patchy groundglass opacities-decreasing small bilateral pleural effusions. 5/14>> gastric emptying study: Nondiagnostic due to emesis 5/24>> HIDA scan: Non- visualization of cystic duct 5/25>> CT head: No acute intracranial abnormality. 5/25>> MRA brain: Intracranial atherosclerosis including severe left M2/right A2 stenosis, moderate left V4, basilar and PCA stenosis. 5/25>> MRI brain: Punctate acute left cerebellar infarct. 5/26>> LTM EEG: No seizures.  Significant microbiology data: 5/08>> COVID PCR: Negative 5/08>> blood culture: Negative 5/11>> pleural fluid culture: No growth 5/22>> blood culture: No growth  Procedures: 5/11>> thoracocentesis (pleural fluid cytology negative) 5/11>> nontunneled dialysis catheter by PCCM 5/20>> tunneled dialysis catheter by IR 5/26>> cholecystostomy tube by IR.  Consults: PCCM Nephrology Interventional radiology General surgery Neurology  Subjective: Lying comfortably in bed-no major issues overnight.  No family at bedside.  Awaiting CIR bed/HD clipping  procedure to be completed before discharge.  Remains medically stable.  No RUQ pain.  Objective: Vitals: Blood pressure (!) 106/54, pulse 78, temperature 98.2 F (36.8 C), temperature source Oral, resp. rate 16, height 5\' 5"  (1.651 m), weight 60 kg, SpO2 97 %.   Exam: Gen Exam:Alert awake-not in any distress HEENT:atraumatic, normocephalic Chest: B/L clear to auscultation anteriorly CVS:S1S2 regular Abdomen:soft non tender, non distended-no RUQ pain. Extremities:no edema Neurology: Non focal Skin: no rash  Pertinent Labs/Radiology:    Latest Ref Rng & Units 05/27/2023    4:43 AM 05/25/2023    2:18 AM 05/23/2023    1:35 PM  CBC  WBC 4.0 - 10.5 K/uL 8.1  14.1  25.2   Hemoglobin 12.0 - 15.0 g/dL 40.9  81.1  91.4   Hematocrit 36.0 - 46.0 % 29.7  28.4  31.8   Platelets 150 - 400 K/uL 351  341  351     Lab Results  Component Value Date   NA 124 (L) 05/27/2023   K 4.1 05/27/2023   CL 84 (L) 05/27/2023   CO2 23 05/27/2023      Assessment/Plan: AKI on CKD stage IIIb Likely hemodynamically mediated-suspicion that she may have progressed to ESRD Nephrology following and directing HD scheduled/care. Continue to monitor for signs of renal recovery  Acute hypoxic respiratory failure Secondary to acute pulm edema in the setting of worsening renal function/pleural effusion.  (PNA ruled out) Volume being managed with HD Currently improved-and on room air  Acute calculus cholecystitis  Had low-grade fever on 5/22 and subsequently worsening leukocytosis/RUQ pain on 5/24-HIDA scan positive-evaluated by general surgery-Plavix held-underwent cholecystostomy tube placement by IR on 5/26 Initially on Zosyn-has been transitioned to Augmentin.    Acute metabolic encephalopathy Had significant worsening of mental status at the end of HD on 5/25-neuroimaging  negative for CVA, LTM EEG negative for seizures.  Suspicion is for encephalopathy in the setting of cholecystitis.  Thankfully after  placement of cholecystostomy tube-she is significantly improved-and completely awake and alert. MRI brain showed a punctate acute infarct-not clear if this is artifactual or a insignificant/incidental finding.  No further recommendations by neurology at this point.  Hyponatremia Due to impaired water excretion in the setting of worsening renal function Improving with ongoing volume removal with hemodialysis.  Hypokalemia Repleted  Acute on chronic HFrEF Acute pulm edema Volume status stable with HD  History of CAD No anginal symptoms Echo with stable EF Continue Plavix/beta-blocker/statin.  HTN BP stable on Coreg Follow/optimize  HLD Statin  DM-2 (A1c 7.7 on 4/18) CBGs stable on 15 units of Semglee daily, 3 units of NovoLog with meals and SSI  Recent Labs    05/27/23 1602 05/27/23 2231 05/28/23 0727  GLUCAP 183* 117* 145*     Subcentimeter left thyroid nodule Per radiology-not clinically significant-no follow-up recommendations.  BMI: Estimated body mass index is 22.01 kg/m as calculated from the following:   Height as of this encounter: 5\' 5"  (1.651 m).   Weight as of this encounter: 60 kg.   Code status:   Code Status: Full Code   DVT Prophylaxis: heparin injection 5,000 Units Start: 05/23/23 2200   Family Communication: None at bedside  Disposition Plan: Status is: Inpatient Remains inpatient appropriate because: Severity of illness   Planned Discharge Destination: Awaiting CIR bed.   Diet: Diet Order             Diet renal/carb modified with fluid restriction Diet-HS Snack? Nothing; Fluid restriction: 1200 mL Fluid; Room service appropriate? Yes; Fluid consistency: Thin  Diet effective now                     Antimicrobial agents: Anti-infectives (From admission, onward)    Start     Dose/Rate Route Frequency Ordered Stop   05/25/23 1600  amoxicillin-clavulanate (AUGMENTIN) 500-125 MG per tablet 1 tablet        1 tablet Oral 2 times  daily 05/25/23 1151 05/29/23 0759   05/21/23 1830  piperacillin-tazobactam (ZOSYN) IVPB 2.25 g  Status:  Discontinued        2.25 g 100 mL/hr over 30 Minutes Intravenous Every 8 hours 05/21/23 1740 05/25/23 1151   05/17/23 1500  ceFAZolin (ANCEF) IVPB 2g/100 mL premix        2 g 200 mL/hr over 30 Minutes Intravenous To Radiology 05/16/23 1204 05/17/23 1537   05/08/23 1000  ceFAZolin (ANCEF) IVPB 2g/100 mL premix        2 g 200 mL/hr over 30 Minutes Intravenous To Radiology 05/07/23 1537 05/09/23 1000   05/05/23 1515  cefTRIAXone (ROCEPHIN) 1 g in sodium chloride 0.9 % 100 mL IVPB        1 g 200 mL/hr over 30 Minutes Intravenous  Once 05/05/23 1510 05/05/23 1642   05/05/23 1515  azithromycin (ZITHROMAX) 500 mg in sodium chloride 0.9 % 250 mL IVPB        500 mg 250 mL/hr over 60 Minutes Intravenous  Once 05/05/23 1510 05/05/23 1750        MEDICATIONS: Scheduled Meds:  amoxicillin-clavulanate  1 tablet Oral BID   atorvastatin  80 mg Oral Daily   bisacodyl  10 mg Rectal Daily   carvedilol  6.25 mg Oral BID WC   Chlorhexidine Gluconate Cloth  6 each Topical Q0600   Chlorhexidine Gluconate Cloth  6 each Topical Q0600   clopidogrel  75 mg Oral Daily   diphenhydrAMINE  25 mg Intravenous Once   docusate sodium  100 mg Oral BID   fluticasone  2 spray Each Nare Daily   guaiFENesin  600 mg Oral BID   heparin injection (subcutaneous)  5,000 Units Subcutaneous Q8H   insulin aspart  0-15 Units Subcutaneous TID WC   insulin aspart  0-5 Units Subcutaneous QHS   insulin aspart  3 Units Subcutaneous TID WC   insulin glargine-yfgn  15 Units Subcutaneous Daily   oxymetazoline  1 spray Each Nare Once   polyethylene glycol  17 g Oral BID   sevelamer carbonate  1,600 mg Oral TID WC   sodium chloride flush  3 mL Intravenous Q12H   Continuous Infusions:  sodium chloride Stopped (05/09/23 2241)   ferric gluconate (FERRLECIT) IVPB 250 mg (05/28/23 0927)   PRN Meds:.sodium chloride, acetaminophen,  albuterol, HYDROmorphone (DILAUDID) injection, iohexol, LORazepam, melatonin, ondansetron **OR** ondansetron (ZOFRAN) IV, mouth rinse, oxyCODONE, sodium chloride, sodium chloride flush   I have personally reviewed following labs and imaging studies  LABORATORY DATA: CBC: Recent Labs  Lab 05/22/23 0308 05/23/23 0354 05/23/23 1335 05/25/23 0218 05/27/23 0443  WBC 20.7* 23.3* 25.2* 14.1* 8.1  NEUTROABS  --   --  22.3*  --   --   HGB 10.7* 11.0* 10.8* 10.0* 10.4*  HCT 31.1* 32.2* 31.8* 28.4* 29.7*  MCV 91.5 94.7 91.6 89.6 91.1  PLT 322 340 351 341 351     Basic Metabolic Panel: Recent Labs  Lab 05/22/23 0309 05/23/23 0354 05/24/23 0335 05/25/23 0218 05/27/23 0443  NA 131* 133* 134* 128* 124*  K 4.1 3.8 4.2 3.6 4.1  CL 92* 91* 91* 85* 84*  CO2 21* 21* 20* 20* 23  GLUCOSE 182* 237* 243* 117* 104*  BUN 60* 46* 79* 95* 58*  CREATININE 5.87* 5.21* 7.32* 8.39* 6.48*  CALCIUM 9.3 9.1 9.2 8.1*  9.0 8.8*  PHOS 7.9* 7.0* 8.4* 7.9* 6.2*     GFR: Estimated Creatinine Clearance: 7.4 mL/min (A) (by C-G formula based on SCr of 6.48 mg/dL (H)).  Liver Function Tests: Recent Labs  Lab 05/22/23 0308 05/22/23 0309 05/23/23 0354 05/24/23 0335 05/25/23 0218 05/27/23 0443  AST 33  --  68*  --   --   --   ALT 16  --  24  --   --   --   ALKPHOS 100  --  153*  --   --   --   BILITOT 1.0  --  1.0  --   --   --   PROT 7.5  --  7.7  --   --   --   ALBUMIN 2.7* 2.8* 2.8* 2.5* 2.1* 2.3*    No results for input(s): "LIPASE", "AMYLASE" in the last 168 hours. Recent Labs  Lab 05/22/23 1621  AMMONIA 15     Coagulation Profile: No results for input(s): "INR", "PROTIME" in the last 168 hours.   Cardiac Enzymes: No results for input(s): "CKTOTAL", "CKMB", "CKMBINDEX", "TROPONINI" in the last 168 hours.  BNP (last 3 results) No results for input(s): "PROBNP" in the last 8760 hours.  Lipid Profile: No results for input(s): "CHOL", "HDL", "LDLCALC", "TRIG", "CHOLHDL",  "LDLDIRECT" in the last 72 hours.  Thyroid Function Tests: No results for input(s): "TSH", "T4TOTAL", "FREET4", "T3FREE", "THYROIDAB" in the last 72 hours.  Anemia Panel: No results for input(s): "VITAMINB12", "FOLATE", "FERRITIN", "TIBC", "IRON", "RETICCTPCT" in the last 72 hours.  Urine analysis:    Component Value Date/Time   COLORURINE YELLOW 05/05/2023 0927   APPEARANCEUR CLEAR 05/05/2023 0927   LABSPEC 1.010 05/05/2023 0927   PHURINE 6.0 05/05/2023 0927   GLUCOSEU 50 (A) 05/05/2023 0927   HGBUR NEGATIVE 05/05/2023 0927   BILIRUBINUR NEGATIVE 05/05/2023 0927   BILIRUBINUR negative 08/18/2021 1207   BILIRUBINUR neg 06/28/2017 1508   KETONESUR NEGATIVE 05/05/2023 0927   PROTEINUR 100 (A) 05/05/2023 0927   UROBILINOGEN 0.2 08/18/2021 1207   UROBILINOGEN 0.2 01/15/2015 1032   NITRITE NEGATIVE 05/05/2023 0927   LEUKOCYTESUR NEGATIVE 05/05/2023 0927    Sepsis Labs: Lactic Acid, Venous    Component Value Date/Time   LATICACIDVEN 2.22 (H) 01/10/2015 1339    MICROBIOLOGY: Recent Results (from the past 240 hour(s))  Culture, blood (Routine X 2) w Reflex to ID Panel     Status: None   Collection Time: 05/19/23  6:06 PM   Specimen: BLOOD LEFT ARM  Result Value Ref Range Status   Specimen Description BLOOD LEFT ARM  Final   Special Requests   Final    BOTTLES DRAWN AEROBIC AND ANAEROBIC Blood Culture adequate volume   Culture   Final    NO GROWTH 5 DAYS Performed at [facility], [address], [place], [place] [address]    Report Status 05/24/2023 FINAL  Final  Culture, blood (Routine X 2) w Reflex to ID Panel     Status: None   Collection Time: 05/19/23  6:06 PM   Specimen: BLOOD LEFT ARM  Result Value Ref Range Status   Specimen Description BLOOD LEFT ARM  Final   Special Requests   Final    BOTTLES DRAWN AEROBIC AND ANAEROBIC Blood Culture adequate volume   Culture   Final    NO GROWTH 5 DAYS Performed at [facility], [address],  [place], [place] [address]    Report Status 05/24/2023 FINAL  Final  Aerobic/Anaerobic Culture w Gram Stain (surgical/deep wound)     Status: None (Preliminary result)   Collection Time: 05/23/23 11:27 AM   Specimen: BILE  Result Value Ref Range Status   Specimen Description BILE  Final   Special Requests NONE  Final   Gram Stain NO WBC SEEN NO ORGANISMS SEEN   Final   Culture   Final    NO GROWTH 4 DAYS NO ANAEROBES ISOLATED; CULTURE IN PROGRESS FOR 5 DAYS Performed at [facility], [address] [place], [place] [address]    Report Status PENDING  Incomplete    RADIOLOGY STUDIES/RESULTS: No results found.   LOS: 23 days   Jeoffrey Massed, MD  [facility]    To contact the attending provider between 7A-7P or the covering provider during after hours 7P-7A, please log into the web site [email or web address] and access using universal [place] password for that web site. If you do not have the password, please call the hospital operator.  05/28/2023, 9:54 AM

## 2023-05-28 NOTE — Progress Notes (Signed)
IP rehab admissions - We have approval for inpatient rehab admission.  I have attending MD clearance for admit to CIR today.  Bed available and will admit to CIR today.  Call for questions.  [phone]

## 2023-05-29 DIAGNOSIS — G9341 Metabolic encephalopathy: Secondary | ICD-10-CM | POA: Diagnosis not present

## 2023-05-29 DIAGNOSIS — K5901 Slow transit constipation: Secondary | ICD-10-CM

## 2023-05-29 LAB — RENAL FUNCTION PANEL
Albumin: 2.9 g/dL — ABNORMAL LOW (ref 3.5–5.0)
Anion gap: 16 — ABNORMAL HIGH (ref 5–15)
BUN: 39 mg/dL — ABNORMAL HIGH (ref 8–23)
CO2: 22 mmol/L (ref 22–32)
Calcium: 8.8 mg/dL — ABNORMAL LOW (ref 8.9–10.3)
Chloride: 86 mmol/L — ABNORMAL LOW (ref 98–111)
Creatinine, Ser: 5.26 mg/dL — ABNORMAL HIGH (ref 0.44–1.00)
GFR, Estimated: 8 mL/min — ABNORMAL LOW (ref >60)
Glucose, Bld: 170 mg/dL — ABNORMAL HIGH (ref 70–99)
Phosphorus: 2.8 mg/dL (ref 2.5–4.6)
Potassium: 3.6 mmol/L (ref 3.5–5.1)
Sodium: 124 mmol/L — ABNORMAL LOW (ref 135–145)

## 2023-05-29 LAB — GLUCOSE, CAPILLARY
Glucose-Capillary: 146 mg/dL — ABNORMAL HIGH (ref 70–99)
Glucose-Capillary: 172 mg/dL — ABNORMAL HIGH (ref 70–99)
Glucose-Capillary: 114 mg/dL — ABNORMAL HIGH (ref 70–99)
Glucose-Capillary: 205 mg/dL — ABNORMAL HIGH (ref 70–99)

## 2023-05-29 LAB — CBC
HCT: 32.8 % — ABNORMAL LOW (ref 36.0–46.0)
Hemoglobin: 11.5 g/dL — ABNORMAL LOW (ref 12.0–15.0)
MCH: 31.9 pg (ref 26.0–34.0)
MCHC: 35.1 g/dL (ref 30.0–36.0)
MCV: 90.9 fL (ref 80.0–100.0)
Platelets: 364 10*3/uL (ref 150–400)
RBC: 3.61 MIL/uL — ABNORMAL LOW (ref 3.87–5.11)
RDW: 12.2 % (ref 11.5–15.5)
WBC: 6.7 10*3/uL (ref 4.0–10.5)
nRBC: 0 % (ref 0.0–0.2)

## 2023-05-29 MED ORDER — HEPARIN SODIUM (PORCINE) 1000 UNIT/ML IJ SOLN
INTRAMUSCULAR | Status: AC
  Filled 2023-05-29: qty 4

## 2023-05-29 MED ORDER — HEPARIN SODIUM (PORCINE) 1000 UNIT/ML DIALYSIS
20.0000 [IU]/kg | INTRAMUSCULAR | Status: DC | PRN
  Administered 2023-05-29: 1300 [IU] via INTRAVENOUS_CENTRAL
  Filled 2023-05-29 (×2): qty 2

## 2023-05-29 MED ORDER — SEVELAMER CARBONATE 800 MG PO TABS
800.0000 mg | ORAL_TABLET | Freq: Three times a day (TID) | ORAL | Status: DC
  Administered 2023-05-29 – 2023-06-07 (×21): 800 mg via ORAL
  Filled 2023-05-29 (×22): qty 1

## 2023-05-29 NOTE — Progress Notes (Signed)
[facility] NEPHROLOGY PROGRESS NOTE  Assessment/ Plan: Pt is a 72 y.o. yo female with a past medical history significant for hypertension, type II DM, PAD, CVA, HLD, CKD 3B who presented with shortness of breath and fluid overload.  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  OP HD was arranged but now is saying that [facility] wont accept so need to look for another unit. Plan for PD later on it seems but will need to wait until cholecystitis is better. If not going to pursue PD will need AV access down the line.  Will be going to [facility] at discharge TTS HD today,  to continue TTS schedule via [facility]  # Acute hypoxic respiratory failure due to fluid overload: Received thoracocentesis and diuresis.  Resolved. Now managing volume with HD.  # HTN/volume: BP acceptable.  No edema.  UF as tolerated.  Discontinue Lasix. Only on low dose coreg.  I think we have achieved EDW   # Anemia: Hemoglobin at goal-  no treatment needed as of yet but getting close-  iron low, repleting  # Acute cholecystitis: HIDA scan was positive therefore on antibiotics.  Seen by general surgery and IR.  S/p cholecystostomy tube placement.  # Acute metabolic encephalopathy - resolved  Bones-  phos up-  calc WNL-  added renvela.  PTH 106-  no meds   Will not see pt tomorrow but will revisit on Monday-  HD planned for today and then next on Tuesday -  call with questions   Subjective:  now in CIR-  family was shocked to know she had a CVA-  it seemed to have happened on 5/25 but she does not have any deficits-  for HD later today   Objective Vital signs in last 24 hours: Vitals:   05/28/23 1400 05/28/23 1959 05/29/23 0642  BP: 131/76 135/65 130/62  Pulse: 74 72 79  Resp: 16 18 18   Temp: 98.4 F (36.9 C) 98.1 F (36.7 C) 98.3 F (36.8 C)  TempSrc: Oral Oral Oral  SpO2: 100% 100%  100%  Weight: 64 kg    Height: 5\' 5"  (1.651 m)     Weight change:   Intake/Output Summary (Last 24 hours) at 05/29/2023 1124 Last data filed at 05/29/2023 0724 Gross per 24 hour  Intake 417 ml  Output 275 ml  Net 142 ml        Labs: RENAL PANEL Recent Labs  Lab 05/23/23 0354 05/24/23 0335 05/25/23 0218 05/27/23 0443 05/28/23 1437  NA 133* 134* 128* 124* 125*  K 3.8 4.2 3.6 4.1 3.5  CL 91* 91* 85* 84* 85*  CO2 21* 20* 20* 23 25  GLUCOSE 237* 243* 117* 104* 106*  BUN 46* 79* 95* 58* 33*  CREATININE 5.21* 7.32* 8.39* 6.48* 4.71*  CALCIUM 9.1 9.2 8.1*  9.0 8.8* 8.4*  PHOS 7.0* 8.4* 7.9* 6.2* 3.8  ALBUMIN 2.8* 2.5* 2.1* 2.3* 2.4*    Liver Function Tests: Recent Labs  Lab 05/23/23 0354 05/24/23 0335 05/25/23 0218 05/27/23 0443 05/28/23 1437  AST 68*  --   --   --   --   ALT 24  --   --   --   --   ALKPHOS 153*  --   --   --   --   BILITOT 1.0  --   --   --   --  PROT 7.7  --   --   --   --   ALBUMIN 2.8*   < > 2.1* 2.3* 2.4*   < > = values in this interval not displayed.   No results for input(s): "LIPASE", "AMYLASE" in the last 168 hours. Recent Labs  Lab 05/22/23 1621  AMMONIA 15   CBC: Recent Labs    05/23/23 0354 05/23/23 1335 05/25/23 0218 05/27/23 0443 05/28/23 1437  HGB 11.0* 10.8* 10.0* 10.4* 10.2*  MCV 94.7 91.6 89.6 91.1 91.9  FERRITIN  --   --  632*  --   --   TIBC  --   --  172*  --   --   IRON  --   --  13*  --   --     Cardiac Enzymes: No results for input(s): "CKTOTAL", "CKMB", "CKMBINDEX", "TROPONINI" in the last 168 hours. CBG: Recent Labs  Lab 05/28/23 0727 05/28/23 1122 05/28/23 1638 05/28/23 2105 05/29/23 0623  GLUCAP 145* 107* 120* 239* 146*    Iron Studies:  No results for input(s): "IRON", "TIBC", "TRANSFERRIN", "FERRITIN" in the last 72 hours.  Studies/Results: No results found.  Medications: Infusions:  sodium chloride     ferric gluconate (FERRLECIT) IVPB      Scheduled Medications:  atorvastatin   80 mg Oral Daily   carvedilol  6.25 mg Oral BID WC   Chlorhexidine Gluconate Cloth  6 each Topical Q0600   Chlorhexidine Gluconate Cloth  6 each Topical Q0600   clopidogrel  75 mg Oral Daily   docusate sodium  100 mg Oral BID   guaiFENesin  600 mg Oral BID   heparin injection (subcutaneous)  5,000 Units Subcutaneous Q8H   insulin aspart  0-15 Units Subcutaneous TID WC   insulin aspart  0-5 Units Subcutaneous QHS   insulin aspart  3 Units Subcutaneous TID WC   insulin glargine-yfgn  15 Units Subcutaneous Daily   polyethylene glycol  17 g Oral BID   sevelamer carbonate  1,600 mg Oral TID WC    have reviewed scheduled and prn medications.  Physical Exam: General:NAD, comfortable Heart:RRR, s1s2 nl Lungs:clear b/l, no crackle Abdomen:soft, mild tenderness, not distended.  Chole drain in place Extremities:No edema Dialysis Access: [facility]  Hazelyn Kallen A Keynan Heffern 05/29/2023,11:24 AM  LOS: 1 day

## 2023-05-29 NOTE — Progress Notes (Addendum)
PROGRESS NOTE   Subjective/Complaints:  Pt doing alright today, slept ok. Denies pain. LBM was this morning. Urinating well. Denies any other complaints or concerns. Daughter had questions about punctate cerebellar stroke, discussed that her unresponsive event on 05/22/23 was felt to be related to metabolic encephalopathy rather than the punctate stroke.   ROS: as per HPI.   Objective:   No results found. Recent Labs    05/27/23 0443 05/28/23 1437  WBC 8.1 8.1  HGB 10.4* 10.2*  HCT 29.7* 29.5*  PLT 351 328   Recent Labs    05/27/23 0443 05/28/23 1437  NA 124* 125*  K 4.1 3.5  CL 84* 85*  CO2 23 25  GLUCOSE 104* 106*  BUN 58* 33*  CREATININE 6.48* 4.71*  CALCIUM 8.8* 8.4*    Intake/Output Summary (Last 24 hours) at 05/29/2023 1200 Last data filed at 05/29/2023 0724 Gross per 24 hour  Intake 417 ml  Output 275 ml  Net 142 ml        Physical Exam: Vital Signs Blood pressure 130/62, pulse 79, temperature 98.3 F (36.8 C), temperature source Oral, resp. rate 18, height 5\' 5"  (1.651 m), weight 64 kg, SpO2 100 %.  Constitutional: No apparent distress. Appropriate appearance for age.  HENT: No JVD. Neck Supple. Trachea midline. Atraumatic, normocephalic. Eyes: PERRLA. EOMI. Visual fields grossly intact-not reassessed.  Cardiovascular: RRR, no murmurs/rub/gallops. No Edema. Peripheral pulses 2+  Respiratory: CTAB. No rales, rhonchi, or wheezing. On RA.  Abdomen: + bowel sounds, normoactive. No distention or tenderness.   PRIOR EXAMS: Skin: + RUQ drain with green tinged clear fluid output; sutured in; nontender, no erythema.   + Right  IJ TDC dressing dry and intact without tenderness  MSK:      No apparent deformity.      Strength:                RUE: 5/5 SA, 5/5 EF, 5/5 EE, 5/5 WE, 5/5 FF, 5/5 FA                 LUE: 5/5 SA, 5/5 EF, 5/5 EE, 5/5 WE, 5/5 FF, 5/5 FA                 RLE: 5/5 HF, 5/5 KE, 5/5 DF,  5/5 EHL, 5/5 PF                 LLE:  5/5 HF, 5/5 KE, 5/5 DF, 5/5 EHL, 5/5 PF    Neurologic exam:  Cognition: AAO to person; not place or time; able to get city, month, and year Language: Difficult to assess d/t language barrier. Names 2/3 objects correctly.  Memory: Recalls 0/3 objects at 5 minutes. Insight: Good insight into current condition.  Mood: Appropriate mood and affect.  Sensation: To light touch intact in BL UEs and LEs  Reflexes: 2+ in BL UE and LEs. Negative Hoffman's and babinski signs bilaterally.  CN: 2-12 grossly intact.  Coordination: Bilateral upper extremity ataxia - mild. Spasticity: MAS 0 in all extremities  Assessment/Plan: 1. Functional deficits which require 3+ hours per day of interdisciplinary therapy in a comprehensive inpatient rehab setting. Physiatrist is providing close team  supervision and 24 hour management of active medical problems listed below. Physiatrist and rehab team continue to assess barriers to discharge/monitor patient progress toward functional and medical goals  Care Tool:  Bathing    Body parts bathed by patient: Right arm, Left arm, Chest, Abdomen, Front perineal area, Buttocks, Right upper leg, Left upper leg, Right lower leg, Left lower leg, Face         Bathing assist Assist Level: Minimal Assistance - Patient > 75%     Upper Body Dressing/Undressing Upper body dressing   What is the patient wearing?: Pull over shirt    Upper body assist Assist Level: Minimal Assistance - Patient > 75%    Lower Body Dressing/Undressing Lower body dressing            Lower body assist Assist for lower body dressing: Minimal Assistance - Patient > 75%     Toileting Toileting    Toileting assist Assist for toileting: Minimal Assistance - Patient > 75%     Transfers Chair/bed transfer  Transfers assist     Chair/bed transfer assist level: Minimal Assistance - Patient > 75%     Locomotion Ambulation   Ambulation  assist      Assist level: Minimal Assistance - Patient > 75% Assistive device: Hand held assist Max distance: 170ft   Walk 10 feet activity   Assist     Assist level: Minimal Assistance - Patient > 75% Assistive device: Hand held assist   Walk 50 feet activity   Assist    Assist level: Minimal Assistance - Patient > 75% Assistive device: Hand held assist    Walk 150 feet activity   Assist Walk 150 feet activity did not occur: Safety/medical concerns         Walk 10 feet on uneven surface  activity   Assist     Assist level: Minimal Assistance - Patient > 75% Assistive device: Hand held assist   Wheelchair     Assist Is the patient using a wheelchair?: Yes (for transport and energy conservation)      Wheelchair assist level: Dependent - Patient 0%      Wheelchair 50 feet with 2 turns activity    Assist        Assist Level: Dependent - Patient 0%   Wheelchair 150 feet activity     Assist      Assist Level: Dependent - Patient 0%   Blood pressure 130/62, pulse 79, temperature 98.3 F (36.8 C), temperature source Oral, resp. rate 18, height 5\' 5"  (1.651 m), weight 64 kg, SpO2 100 %.   Medical Problem List and Plan:  1. Functional deficits secondary to metabolic encephalopathy due to cholecystitis             - patient may  shower             - ELOS/Goals: 7-10 days, Mod I PT/OT - Of note, workup did show MRI brain w/ punctate acute infarct-not clear if this is artifactual or a incidental finding; neurology deemed presentation more consistent with metabolic encephalopathy and gave no further recommendations.--reviewed findings with daughter again today, 05/29/23   2.  Antithrombotics: -DVT/anticoagulation:  Pharmaceutical: Heparin 5000U Q8H             -antiplatelet therapy: Plavix 75mg  QD   3. Pain Management: Tylenol and robaxin as needed   4. Mood/Behavior/Sleep: LCSW to evaluate and provide emotional support              -  antipsychotic agents: n/a   -Trazodone PRN  5. Neuropsych/cognition: This patient is capable of making decisions on her own behalf.   6. Skin/Wound Care: Routine skin care checks; pressure relief measures             -routine drain care   7. Fluids/Electrolytes/Nutrition: Strict Is and Os and follow-up chemistries per nephrology             -renal/carb modified diet             -continue protein supplementation, Renvela TID             -1200 cc FR   8: DM-2, A1c = 7.7%             -CGBs QID and carb mod diet, SSI             -continue Semglee 15 units daily             -continue Novolog 3 units TID WC  -05/29/23 CBGs variable, monitor for trend  CBG (last 3)  Recent Labs    05/28/23 2105 05/29/23 0623 05/29/23 1147  GLUCAP 239* 146* 172*      9: Hyperlipidemia: continue Atorvastatin 80mg  QD   10: Hyponatremia: management per nephrology; no intervention or recheck for Na 124 5/30 rec'd on admission   11: Acute on chronic diastolic CHF, pulmonary edema: EF 55-60%             -strict Is and Os and daily weight             -1200 cc FR             -continue Coreg 6.25mg  BID   12: Hypertension: continue carvedilol 6.25 mg BID  -05/29/23 BPs stable, monitor  Vitals:   05/28/23 1400 05/28/23 1959 05/29/23 0642  BP: 131/76 135/65 130/62      13: Iron deficiency: continue Ferrlecit through 6/02   14: ESRD on HD via right IJ TDC on 5/20             -HD on T/T/S schedule             -has been accepted at [facility] in [facility] on TTS 11:40    -Appreciate nephrology assistance   15: Anemia: continue IV iron and follow-up CBC   16: Cholelithiasis/cholecystitis s/p cholecystostomy tube -leukocytosis resolved, continue Augmentin through 6/01-- had one dose 05/28/23 and then d/c'd, per pharmacy and d/c summary, end date 5/31 -will need follow up in drain clinic and follow up with Dr. Sheliah Hatch after that to discuss whether she is a candidate for laparoscopic  cholecystectomy - Daily flushes 5-10 cc    17: History of CAD: continue Plavix, statin, BB   18: Constipation possible contributing to nausea and lack of appetite             -continue Colace 100mg  BID and Miralax 17g BID; Dulcolax supp prn  -Continue Zofran 4mg  q6h PRN for nausea  -05/29/23 LBM this morning, monitor for now    LOS: 1 days A FACE TO FACE EVALUATION WAS PERFORMED  Hoby Kawai 05/29/2023, 12:00 PM

## 2023-05-29 NOTE — Procedures (Signed)
HD Note:  Some information was entered later than the data was gathered due to patient care needs. The stated time with the data is accurate.Received patient in bed to unit.  Alert and oriented. Patient unable to speak [place].  Have access to telephone translator when needed. Informed consent signed and in chart.   TX duration:2.5 hours    Patient communicated through a phone translator that she was done with treatment and wanted to be taken off.  She also stated she was nauseated.  When off of the phone, she did vomit what appeared to be mucous.  The translator explained that she would have to sign a statement saying that she was stopping AMA.  She understood and did sign the form.   Transported back to the room    Hand-off given to patient's nurse.   Access used: upper right chest HD catheter Access issues: None  Total UF removed: 1100 ml    [facility]

## 2023-05-29 NOTE — Progress Notes (Signed)
Occupational Therapy Session Note  Patient Details  Name: Samantha [name] MRN: [id number] Date of Birth: 1951-08-06  Today's Date: 05/29/2023 OT Individual Time: 6578-4696 OT Individual Time Calculation (min): 43 min    Short Term Goals: Week 1:  OT Short Term Goal 1 (Week 1): STG=LTG d.t ELOS  Skilled Therapeutic Interventions/Progress Updates:  Balance/vestibular training;Discharge planning;Pain management;Self Care/advanced ADL retraining;Therapeutic Activities;UE/LE Coordination activities;Therapeutic Exercise;Skin care/wound managment;Visual/perceptual remediation/compensation;Patient/family education;Functional mobility training;Disease mangement/prevention;Cognitive remediation/compensation;DME/adaptive equipment instruction;Neuromuscular re-education;Psychosocial support;UE/LE Strength taining/ROM   Pt received in w/c with no pain but agreeable to washing her hair at the sink. Pt completes hair washing with A for rinsing and need MAX cuing/encouragement to try to put shampoo in her hair. Pt able ot do with ease and when asked if it was difficult she said that it wasn't bad. After drying and combing hair, pt told that she will be going to the gym. Pt states that she is dizzy, when vitals assessed & stable she states she is nauseas. RN alerted and brings meds. Pt requesting to use BSC. OT makes pt ambualte to bathroom for bladder void. Pt is CGA for all mobility with MIN A for clothing management after toileting. Exited session with pt seated in bed, exit alarm on and call light in reach   Therapy Documentation Precautions:  Precautions Precautions: Fall, Other (comment) Precaution Comments: denies falls in past 6 months Restrictions Weight Bearing Restrictions: No General: General Family/Caregiver Present: No Vital Signs:   Pain: Pain Assessment Pain Scale: 0-10 Pain Score: 0-No pain ADL: ADL Eating: Set up Grooming: Setup Where Assessed-Grooming: Sitting at  sink Upper Body Bathing: Setup Where Assessed-Upper Body Bathing: Sitting at sink Lower Body Bathing: Minimal assistance Where Assessed-Lower Body Bathing: Sitting at sink, Standing at sink Upper Body Dressing: Supervision/safety Where Assessed-Upper Body Dressing: Sitting at sink Lower Body Dressing: Minimal assistance Where Assessed-Lower Body Dressing: Sitting at sink Toileting: Minimal assistance Where Assessed-Toileting: Teacher, adult education: Curator Method: Stand pivot Vision Baseline Vision/History: 4 Cataracts;0 No visual deficits Patient Visual Report: Peripheral vision impairment (reports R eye R visual field was "bright and shiny" but is now more blurry) Vision Assessment?: Yes Eye Alignment: Within Functional Limits Ocular Range of Motion: Within Functional Limits Alignment/Gaze Preference: Within Defined Limits Tracking/Visual Pursuits: Decreased smoothness of horizontal tracking (noticed L eye did not track laterally consistently, but R eye tracked normally (did have some difficulty overall following the visual stimulus)) Saccades: Additional eye shifts occurred during testing Convergence: Within functional limits Perception  Perception: Within Functional Limits Praxis Praxis: Intact Balance Balance Balance Assessed: Yes Dynamic Sitting Balance Dynamic Sitting - Level of Assistance: 5: Stand by assistance Static Standing Balance Static Standing - Level of Assistance: 4: Min assist Dynamic Standing Balance Dynamic Standing - Level of Assistance: 4: Min assist Exercises:   Other Treatments:     Therapy/Group: Individual Therapy  Shon Hale 05/29/2023, 11:20 AM

## 2023-05-29 NOTE — Evaluation (Signed)
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)   Past Medical History:  Past Medical History:  Diagnosis Date   Acute pyelonephritis    Bacteremia, escherichia coli 01/12/2015   Chest pain    Critical lower limb ischemia (HCC)    Hyperlipidemia    Hypertension    Left carotid bruit    PAD (peripheral artery disease) (HCC)    Type II diabetes mellitus (HCC)    Past Surgical History:  Past Surgical History:  Procedure Laterality Date   ABDOMINAL ANGIOGRAM  05/16/2015   Procedure: Abdominal Angiogram;  Surgeon: Runell Gess, MD;  Location: [facility] INVASIVE CV LAB;  Service: Cardiovascular;;   ABDOMINAL HYSTERECTOMY  ~ 2000   IR FLUORO GUIDE CV LINE RIGHT  05/17/2023   IR PERC CHOLECYSTOSTOMY  05/23/2023   IR US GUIDE VASC ACCESS RIGHT  05/17/2023   PERIPHERAL VASCULAR CATHETERIZATION Bilateral 05/16/2015   Procedure: Lower Extremity Angiography;  Surgeon: Runell Gess, MD; renal arteries widely patent, L-SFA 75%, L-pop 95%, L-peroneal 90%; R-SFA 40%, R-pop 50%, R-prox peroneal 90%; directional atherectomy L-SFA, p-pop, drug-eluting balloon angioplasty reducing the stenoses to 0, small linear dissection was not flow limiting       PERIPHERAL VASCULAR CATHETERIZATION Left 05/16/2015   Procedure: Peripheral Vascular Atherectomy;  Surgeon: Runell Gess, MD;  Location: [facility] INVASIVE CV LAB;  Service: Cardiovascular;  Laterality: Left;  sfa    Assessment &  Plan Clinical Impression: 72 y.o. female who presented on 5/8 with acute hypoxic respiratory failure in the setting of pulm edema with worsening renal function. CT of chest found new bilateral pleural effusions, right> left parenchymal opacities. 5/11 thoracocentesis.   Patient currently requires min with basic self-care skills secondary to muscle weakness, decreased cardiorespiratoy endurance, decreased awareness, decreased problem solving, decreased safety awareness, and decreased memory, and decreased standing balance, decreased postural control, and decreased balance strategies.  Prior to hospitalization, patient could complete BADL/IADL with independent .  Patient will benefit from skilled intervention to decrease level of assist with basic self-care skills and increase independence with basic self-care skills prior to discharge home with care partner.  Anticipate patient will require 24 hour supervision and follow up home health.  OT - End of Session Activity Tolerance: Tolerates 30+ min activity with multiple rests Endurance Deficit: Yes OT Assessment Rehab Potential (ACUTE ONLY): Good OT Barriers to Discharge: Incontinence OT Patient demonstrates impairments in the following area(s): Balance;Cognition;Endurance;Motor;Safety;Behavior (self limiting) OT Basic ADL's Functional Problem(s): Grooming;Bathing;Dressing;Toileting OT Transfers Functional Problem(s): Toilet;Tub/Shower OT Plan OT Intensity: Minimum of 1-2 x/day, 45 to 90 minutes OT Frequency: 5 out of 7 days OT Duration/Estimated Length of Stay: 7-10 OT Treatment/Interventions: Balance/vestibular training;Discharge planning;Pain management;Self Care/advanced ADL retraining;Therapeutic Activities;UE/LE Coordination activities;Therapeutic Exercise;Skin care/wound managment;Visual/perceptual remediation/compensation;Patient/family education;Functional mobility training;Disease mangement/prevention;Cognitive  remediation/compensation;DME/adaptive equipment instruction;Neuromuscular re-education;Psychosocial support;UE/LE Strength taining/ROM OT Self Feeding Anticipated Outcome(s): no goal OT Basic Self-Care Anticipated Outcome(s): MOD I OT Toileting Anticipated Outcome(s): MOD I OT Bathroom Transfers Anticipated Outcome(s): MOD I toilet, S shower OT Recommendation Patient destination: Home Follow Up Recommendations: Home health  OT   OT Evaluation Precautions/Restrictions  Precautions Precautions: Fall Precaution Comments: denies falls in past 6 months Restrictions Weight Bearing Restrictions: No General Family/Caregiver Present: No Vital Signs Therapy Vitals Temp: 98.3 F (36.8 C) Temp Source: Oral Pulse Rate: 79 Resp: 18 BP: 130/62 Patient Position (if appropriate): Sitting Oxygen Therapy SpO2: 100 % O2 Device: Room Air Pain Pain Assessment Pain Score: 0-No pain Home Living/Prior Functioning Home Living Family/patient expects to be discharged to:: Private residence Living Arrangements: Children Available Help at Discharge: Family, Available 24 hours/day Type of Home: House Home Access: Stairs to enter Secretary/administrator of Steps: 3 Entrance Stairs-Rails: None Home Layout: One level Bathroom Shower/Tub: Engineer, manufacturing systems: Standard (has a shower chair per pt report) Prior Function Level of Independence: Independent with basic ADLs, Independent with homemaking with ambulation  Able to Take Stairs?: Yes Vision Baseline Vision/History: 4 Cataracts;0 No visual deficits Ability to See in Adequate Light: 0 Adequate Patient Visual Report: Peripheral vision impairment;No change from baseline Vision Assessment?: Yes Eye Alignment: Within Functional Limits Ocular Range of Motion: Within Functional Limits Alignment/Gaze Preference: Within Defined Limits Tracking/Visual Pursuits: Decreased smoothness of horizontal tracking Saccades: Additional eye shifts  occurred during testing Convergence: Within functional limits Perception  Perception: Within Functional Limits Praxis Praxis: Intact Cognition Cognition Overall Cognitive Status: No family/caregiver present to determine baseline cognitive functioning Arousal/Alertness: Awake/alert Orientation Level: Person;Place;Situation Person: Oriented Place: Oriented Situation: Oriented Memory: Impaired Safety/Judgment: Impaired Brief Interview for Mental Status (BIMS) Repetition of Three Words (First Attempt): 3 Temporal Orientation: Year: Missed by 2 to 5 years Temporal Orientation: Month: Missed by 6 days to 1 month Temporal Orientation: Day: Correct Recall: "Sock": No, could not recall Recall: "Blue": Yes, no cue required Recall: "Bed": Yes, after cueing ("a piece of furniture") BIMS Summary Score: 9 Sensation Sensation Light Touch: Impaired by gross assessment Coordination Gross Motor Movements are Fluid and Coordinated: Yes Fine Motor Movements are Fluid and Coordinated: Yes Motor  Motor Motor: Abnormal postural alignment and control Motor - Skilled Clinical Observations: generalized  weakness  Trunk/Postural Assessment  Cervical Assessment Cervical Assessment:  (head forward) Thoracic Assessment Thoracic Assessment:  (rounded shoulders) Lumbar Assessment Lumbar Assessment:  (post tilt) Postural Control Postural Control: Deficits on evaluation (decreased delayed)  Balance Balance Balance Assessed: Yes Dynamic Sitting Balance Dynamic Sitting - Level of Assistance: 5: Stand by assistance Static Standing Balance Static Standing - Level of Assistance: 4: Min assist Dynamic Standing Balance Dynamic Standing - Level of Assistance: 4: Min assist Extremity/Trunk Assessment RUE Assessment General Strength Comments: generalized weakness LUE Assessment General Strength Comments: generalized weakness  Care Tool Care Tool Self Care Eating   Eating Assist Level: Set up  assist    Oral Care    Oral Care Assist Level: Set up assist    Bathing   Body parts bathed by patient: Right arm;Left arm;Chest;Abdomen;Front perineal area;Buttocks;Right upper leg;Left upper leg;Right lower leg;Left lower leg;Face     Assist Level: Minimal Assistance - Patient > 75%    Upper Body Dressing(including orthotics)   What is the patient wearing?: Pull over shirt   Assist Level: Minimal Assistance - Patient > 75%    Lower Body Dressing (excluding footwear)     Assist for lower body dressing: Minimal Assistance - Patient > 75%    Putting on/Taking off footwear     Assist for footwear: Supervision/Verbal cueing       Care Tool Toileting Toileting activity   Assist for toileting: Minimal Assistance - Patient > 75%  Care Tool Bed Mobility Roll left and right activity        Sit to lying activity        Lying to sitting on side of bed activity         Care Tool Transfers Sit to stand transfer   Sit to stand assist level: Minimal Assistance - Patient > 75%    Chair/bed transfer   Chair/bed transfer assist level: Minimal Assistance - Patient > 75%     Toilet transfer   Assist Level: Minimal Assistance - Patient > 75%     Care Tool Cognition  Expression of Ideas and Wants    Understanding Verbal and Non-Verbal Content     Memory/Recall Ability     Refer to Care Plan for Long Term Goals  SHORT TERM GOAL WEEK 1 OT Short Term Goal 1 (Week 1): STG=LTG d.t ELOS  Recommendations for other services: None    Skilled Therapeutic Intervention ADL ADL Eating: Set up Grooming: Setup Where Assessed-Grooming: Sitting at sink Upper Body Bathing: Setup Where Assessed-Upper Body Bathing: Sitting at sink Lower Body Bathing: Minimal assistance Where Assessed-Lower Body Bathing: Sitting at sink;Standing at sink Upper Body Dressing: Supervision/safety Where Assessed-Upper Body Dressing: Sitting at sink Lower Body Dressing: Minimal assistance Where  Assessed-Lower Body Dressing: Sitting at sink Toileting: Minimal assistance Where Assessed-Toileting: Teacher, adult education: Curator Method: Stand pivot Mobility  Transfers Sit to Stand: Minimal Assistance - Patient > 75% Stand to Sit: Minimal Assistance - Patient > 75%  1:1. Pt educated on OT role/purpose, CIR, ELOS, and debiltiy/encephalopathy recovery. Pt intially declining tx stating she has been "really sick" and isnt even able to walk. With empathetic listenting pt eventfully agreeable to BADL at sink level as stated above. Pt biggest limiting factors are decreased activity tolerance, willingness to participate, and safety awareness.Gweneth Dimitri of adaptive strategies. She has good support at home and will benefit from intense rehab for improved recovery speed. Exited session with pt seated in bed, exit alarm on and call light in reach  Discharge Criteria: Patient will be discharged from OT if patient refuses treatment 3 consecutive times without medical reason, if treatment goals not met, if there is a change in medical status, if patient makes no progress towards goals or if patient is discharged from hospital.  The above assessment, treatment plan, treatment alternatives and goals were discussed and mutually agreed upon: by patient  Shon Hale 05/29/2023, 8:18 AM

## 2023-05-29 NOTE — Plan of Care (Signed)
  Problem: RH Balance Goal: LTG Patient will maintain dynamic sitting balance (PT) Description: LTG:  Patient will maintain dynamic sitting balance with assistance during mobility activities (PT) Flowsheets (Taken 05/29/2023 1301) LTG: Pt will maintain dynamic sitting balance during mobility activities with:: Independent with assistive device  Goal: LTG Patient will maintain dynamic standing balance (PT) Description: LTG:  Patient will maintain dynamic standing balance with assistance during mobility activities (PT) Flowsheets (Taken 05/29/2023 1301) LTG: Pt will maintain dynamic standing balance during mobility activities with:: Supervision/Verbal cueing   Problem: Sit to Stand Goal: LTG:  Patient will perform sit to stand with assistance level (PT) Description: LTG:  Patient will perform sit to stand with assistance level (PT) Flowsheets (Taken 05/29/2023 1301) LTG: PT will perform sit to stand in preparation for functional mobility with assistance level: Independent with assistive device   Problem: RH Bed Mobility Goal: LTG Patient will perform bed mobility with assist (PT) Description: LTG: Patient will perform bed mobility with assistance, with/without cues (PT). Flowsheets (Taken 05/29/2023 1301) LTG: Pt will perform bed mobility with assistance level of: Independent with assistive device    Problem: RH Bed to Chair Transfers Goal: LTG Patient will perform bed/chair transfers w/assist (PT) Description: LTG: Patient will perform bed to chair transfers with assistance (PT). Flowsheets (Taken 05/29/2023 1301) LTG: Pt will perform Bed to Chair Transfers with assistance level: Independent with assistive device    Problem: RH Car Transfers Goal: LTG Patient will perform car transfers with assist (PT) Description: LTG: Patient will perform car transfers with assistance (PT). Flowsheets (Taken 05/29/2023 1301) LTG: Pt will perform car transfers with assist:: Supervision/Verbal cueing   Problem:  RH Ambulation Goal: LTG Patient will ambulate in controlled environment (PT) Description: LTG: Patient will ambulate in a controlled environment, # of feet with assistance (PT). Flowsheets (Taken 05/29/2023 1301) LTG: Pt will ambulate in controlled environ  assist needed:: Supervision/Verbal cueing LTG: Ambulation distance in controlled environment: 139ft using LRAD Goal: LTG Patient will ambulate in home environment (PT) Description: LTG: Patient will ambulate in home environment, # of feet with assistance (PT). Flowsheets (Taken 05/29/2023 1301) LTG: Pt will ambulate in home environ  assist needed:: Supervision/Verbal cueing LTG: Ambulation distance in home environment: 2ft using LRAD   Problem: RH Stairs Goal: LTG Patient will ambulate up and down stairs w/assist (PT) Description: LTG: Patient will ambulate up and down # of stairs with assistance (PT) Flowsheets (Taken 05/29/2023 1301) LTG: Pt will ambulate up/down stairs assist needed:: Supervision/Verbal cueing LTG: Pt will  ambulate up and down number of stairs: 2 steps using RW per home set-up

## 2023-05-29 NOTE — Evaluation (Signed)
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)   Past Medical History:  Past Medical History:  Diagnosis Date   Acute pyelonephritis    Bacteremia, escherichia coli 01/12/2015   Chest pain    Critical lower limb ischemia (HCC)    Hyperlipidemia    Hypertension    Left carotid bruit    PAD (peripheral artery disease) (HCC)    Type II diabetes mellitus (HCC)    Past Surgical History:  Past Surgical History:  Procedure Laterality Date   ABDOMINAL ANGIOGRAM  05/16/2015   Procedure: Abdominal Angiogram;  Surgeon: Runell Gess, MD;  Location: [facility] INVASIVE CV LAB;  Service: Cardiovascular;;   ABDOMINAL HYSTERECTOMY  ~ 2000   IR FLUORO GUIDE CV LINE RIGHT  05/17/2023   IR PERC CHOLECYSTOSTOMY  05/23/2023   IR US GUIDE VASC ACCESS RIGHT  05/17/2023   PERIPHERAL VASCULAR CATHETERIZATION Bilateral 05/16/2015   Procedure: Lower Extremity Angiography;  Surgeon: Runell Gess, MD; renal arteries widely patent, L-SFA 75%, L-pop 95%, L-peroneal 90%; R-SFA 40%, R-pop 50%, R-prox peroneal 90%; directional atherectomy L-SFA, p-pop, drug-eluting balloon angioplasty reducing the stenoses to 0, small linear dissection was not flow limiting       PERIPHERAL VASCULAR CATHETERIZATION Left 05/16/2015   Procedure: Peripheral Vascular Atherectomy;  Surgeon: Runell Gess, MD;  Location: [facility] INVASIVE CV LAB;  Service: Cardiovascular;  Laterality: Left;  sfa    Assessment & Plan Clinical Impression:  Patient is a 72 y.o. year old female  who presented to the ED at [facility] complaining of pain and SOB. CT chest abdomen/pelvis was done which showed pulmonary edema versus bibasilar infiltrates. Antibiotics started then discontinued due to low suspicion for pneumonia in setting due to BNP was elevated to 600, bilateral pulmonary effusions. Initially patient received IV fluid bolus LR 1 L. Oxygen requirement went up to 4.5 L/min. last echo from 2022 showed EF 65% with grade 1 diastolic dysfunction. Echocardiogram updated. RUQ ultrasound positive for gallstones. Nephrology consulted on 5/08. Increased WOB on 5/09 and required BiPAP.  She was not diuresing well despite high dose of Lasix per nephrology and was transferred to [facility] due to acute respiratory failure, acute on chronic diastolic heart failure. PCCM consulted on 5/11.  Dialysis catheter placed on 5/11 as well as bilateral thoracenteses. Started intermittent HD on 5/15. Her blood and pleural fluid cultures were negative. Due to ongoing need for HD a right IJ TDC was placed on 5/20. She had ongoing abdominal discomfort despite disimpaction and treatment of constipation. HIDA scan performed on 5/24 and general surgery consulted due to non-visualization. Plavix was held and she under percutaneous cholecystostomy tube placement on 5/26. Rapid response called on 5/25 due to increased lethargy after HD only responding to pain. CT, Mri of brain obtained and neurology consulted. CT-head-aspects 10.  No bleed.  Chronic basal ganglia and cerebellar infarcts. EEG obtained. MRI read as acute cerebellar infarct (not seen on review by neurologist), symptoms attributed to acute metabolic encephalopathy. Normal SaO2 on room air. BP stable. Poor PO intake. The patient requires  inpatient medicine and rehabilitation evaluations and services for ongoing dysfunction secondary to acute renal and respiratory failure.   She was recently hospitalized 4/17 through 4/21.  Evaluation revealed temp of 100.4, hypertensive, mild leukocytosis and AKI with Cr to 2.88. CT chest with findings consistent with mild interstital lung disease. CT abd/pelvis with perinephric stranding symmetric and likely on a senescent basis. No hydronephrosis or ureteral stones. No other acute pathology. She was given IV fluid bolus and started on IV Rocephin and hospitalist was consulted for admission. Suspected sepsis secondary to UTI. History of DM-2 and hypoglycemic at home PTA. A1c 7.7%. Urine cultures for pansensitive E. coli. Antibiotics changed to cefadroxil. She also has a past medical history of CVA and is maintained on Plavix and statin. Nephrology consulted. Discharged on 04/18/2023 on Lasix 40 mg p.o. daily for 7 days. Patient transferred to CIR on 05/28/2023 .   Patient currently requires min with mobility secondary to muscle weakness, decreased cardiorespiratoy endurance, unbalanced muscle activation, decreased visual motor skills, decreased awareness and decreased memory, and decreased standing balance and decreased balance strategies.  Prior to hospitalization, patient was independent  with mobility and lived with Family, Daughter (lived with her daughter, [name]) in a House home.  Home access is 3Stairs to enter.  Patient will benefit from skilled PT intervention to maximize safe functional mobility, minimize fall risk, and decrease caregiver burden for planned discharge home with 24 hour supervision.  Anticipate patient will benefit from follow up OP at discharge.  PT - End of Session Activity Tolerance: Tolerates 30+ min activity with multiple rests Endurance Deficit: Yes Endurance Deficit Description: requires frequent seated rest breaks PT Assessment Rehab Potential (ACUTE/IP ONLY): Good PT Barriers to Discharge: Decreased caregiver support;Behavior PT Patient demonstrates impairments in the following area(s): Balance;Perception;Safety;Behavior;Sensory;Skin  Integrity;Endurance;Motor;Nutrition;Pain PT Transfers Functional Problem(s): Bed Mobility;Bed to Chair;Car;Furniture;Floor PT Locomotion Functional Problem(s): Ambulation;Stairs PT Plan PT Intensity: Minimum of 1-2 x/day ,45 to 90 minutes PT Frequency: 5 out of 7 days PT Duration Estimated Length of Stay: 7-10 days PT Treatment/Interventions: Ambulation/gait training;Community reintegration;DME/adaptive equipment instruction;Neuromuscular re-education;Psychosocial support;Stair training;UE/LE Strength taining/ROM;Balance/vestibular training;Discharge planning;Functional electrical stimulation;Pain management;Skin care/wound management;Therapeutic Activities;UE/LE Coordination activities;Cognitive remediation/compensation;Disease management/prevention;Functional mobility training;Patient/family education;Splinting/orthotics;Therapeutic Exercise;Visual/perceptual remediation/compensation PT Transfers Anticipated Outcome(s): mod-I using LRAD PT Locomotion Anticipated Outcome(s): supervision using LRAD PT Recommendation Follow Up Recommendations: Outpatient PT;24 hour supervision/assistance Patient destination: Home Equipment Recommended: To be determined   PT Evaluation Precautions/Restrictions Precautions Precautions: Fall;Other (comment) Precaution Comments: denies falls in past 6 months, drain on R side Restrictions Weight Bearing Restrictions: No Pain Pain Assessment Pain Scale: 0-10 Pain Score: 0-No pain Pain Interference Pain Interference Pain Effect on Sleep: 0. Does not apply - I have not had any pain or hurting in the past 5 days Pain Interference with Therapy Activities: 1. Rarely or not at all Pain Interference with Day-to-Day Activities: 1. Rarely or not at all Home Living/Prior Functioning Home Living Available Help at Discharge: Family;Available PRN/intermittently (trying to get another one of pt's DTRs from [place] to come to help in order to provide 24hr support  otherwise there will be gaps in support between the 2 DTRs who are here [name] & Debarah Crape)) Type of Home: House Home Access: Stairs to enter [employer] of Steps: 1 step-up onto porch + 1 step-up into house Entrance Stairs-Rails: None Home Layout: One level Additional Comments: lives with daughter  Lives With: Family;Daughter (lived with her daughter, [name]) Prior Function Level of Independence: Independent with basic ADLs;Independent with homemaking with ambulation  Able to Take  Stairs?: Yes Vision/Perception  Vision - History Ability to See in Adequate Light: 0 Adequate Vision - Assessment Eye Alignment: Within Functional Limits Ocular Range of Motion: Within Functional Limits Alignment/Gaze Preference: Within Defined Limits Tracking/Visual Pursuits: Decreased smoothness of horizontal tracking (noticed L eye did not track laterally consistently, but R eye tracked normally (did have some difficulty overall following the visual stimulus)) Perception Perception: Within Functional Limits Praxis Praxis: Intact  Cognition Overall Cognitive Status: History of cognitive impairments - at baseline (pt reports feeling memory difficulties since prior to hospitalization) Arousal/Alertness: Awake/alert Orientation Level: Oriented to person;Oriented to place;Oriented to time;Oriented to situation (oriented to month & day but not year; unable to remember her birthday) Year: Other (Comment) Month: June Memory: Impaired Awareness: Impaired Safety/Judgment: Impaired Sensation Sensation Light Touch: Appears Intact Hot/Cold: Not tested Proprioception: Appears Intact Stereognosis: Not tested Coordination Gross Motor Movements are Fluid and Coordinated: Yes (slow and guarded due to poor endurance) Motor  Motor Motor: Abnormal postural alignment and control Motor - Skilled Clinical Observations: generalized  weakness and deconditioning   Trunk/Postural Assessment  Cervical  Assessment Cervical Assessment: Exceptions to [facility] (mild forward head) Thoracic Assessment Thoracic Assessment: Exceptions to [facility] (mild rounded shoulders) Lumbar Assessment Lumbar Assessment: Exceptions to [facility] (mild posterior pelvic tilt) Postural Control Postural Control: Deficits on evaluation (decreased delayed)  Balance Balance Balance Assessed: Yes Static Sitting Balance Static Sitting - Balance Support: Feet supported Static Sitting - Level of Assistance: 5: Stand by assistance Dynamic Sitting Balance Dynamic Sitting - Balance Support: Feet supported Dynamic Sitting - Level of Assistance: 5: Stand by assistance Static Standing Balance Static Standing - Balance Support: During functional activity Static Standing - Level of Assistance: 4: Min assist;Other (comment) (CGA) Dynamic Standing Balance Dynamic Standing - Balance Support: During functional activity;Right upper extremity supported Dynamic Standing - Level of Assistance: 4: Min assist Extremity Assessment  RLE Assessment RLE Assessment: Exceptions to [facility] Active Range of Motion (AROM) Comments: WFL/WNL General Strength Comments: assessed in supine RLE Strength Right Hip Flexion: 4/5 Right Knee Flexion: 4/5 Right Knee Extension: 4/5 Right Ankle Dorsiflexion: 4/5 Right Ankle Plantar Flexion: 4/5 LLE Assessment LLE Assessment: Exceptions to [facility] Active Range of Motion (AROM) Comments: WFL/WNL General Strength Comments: assessed in supine LLE Strength Left Hip Flexion: 4-/5 Left Knee Flexion: 4-/5 Left Knee Extension: 4-/5 Left Ankle Dorsiflexion: 4-/5 Left Ankle Plantar Flexion: 4-/5  Care Tool Care Tool Bed Mobility Roll left and right activity   Roll left and right assist level: Independent with assistive device    Sit to lying activity   Sit to lying assist level: Supervision/Verbal cueing    Lying to sitting on side of bed activity   Lying to sitting on side of bed assist level: the ability to move from  lying on the back to sitting on the side of the bed with no back support.: Supervision/Verbal cueing     Care Tool Transfers Sit to stand transfer   Sit to stand assist level: Minimal Assistance - Patient > 75%    Chair/bed transfer   Chair/bed transfer assist level: Minimal Assistance - Patient > 75%     Aeronautical engineer transfer activity did not occur: Safety/medical concerns        Care Tool Locomotion Ambulation   Assist level: Minimal Assistance - Patient > 75% Assistive device: Hand held assist Max distance: 171ft  Walk 10 feet activity   Assist level: Minimal Assistance - Patient > 75% Assistive  device: Hand held assist   Walk 50 feet with 2 turns activity   Assist level: Minimal Assistance - Patient > 75% Assistive device: Hand held assist  Walk 150 feet activity Walk 150 feet activity did not occur: Safety/medical concerns      Walk 10 feet on uneven surfaces activity Walk 10 feet on uneven surfaces activity did not occur: Safety/medical concerns      Stairs   Assist level: Minimal Assistance - Patient > 75% Stairs assistive device: 2 hand rails Max number of stairs: 4  Walk up/down 1 step activity   Walk up/down 1 step (curb) assist level: Minimal Assistance - Patient > 75% Walk up/down 1 step or curb assistive device: 2 hand rails  Walk up/down 4 steps activity   Walk up/down 4 steps assist level: Minimal Assistance - Patient > 75% Walk up/down 4 steps assistive device: 2 hand rails  Walk up/down 12 steps activity Walk up/down 12 steps activity did not occur: Safety/medical concerns      Pick up small objects from floor   Pick up small object from the floor assist level: Minimal Assistance - Patient > 75%    Wheelchair Is the patient using a wheelchair?: Yes (for transport and energy conservation)     Wheelchair assist level: Dependent - Patient 0%    Wheel 50 feet with 2 turns activity   Assist Level: Dependent - Patient 0%   Wheel 150 feet activity   Assist Level: Dependent - Patient 0%    Refer to Care Plan for Long Term Goals  SHORT TERM GOAL WEEK 1 PT Short Term Goal 1 (Week 1): = to LTGs based on ELOS  Recommendations for other services: None   Skilled Therapeutic Intervention Pt received supine in bed awake and agreeable to therapy session. Valentina Gu, in-person interpreter, present throughout session. Pt's daughter, Debarah Crape, arrives shortly after. Evaluation completed (see details above) with patient education regarding purpose of PT evaluation, PT POC and goals, therapy schedule, weekly team meetings, and other CIR information including safety plan and fall risk safety. Pt performed the below functional mobility tasks with the specified levels of skilled cuing and assistance. Pt presents with overall decreased activity tolerance, repeatedly reporting fatigue and feeling of being tired. With encouragement, at end of session, pt agreeable to remain sitting up in wheelchair. Left with needs in reach, seat belt alarm on, and her daughter present.   Mobility Bed Mobility Bed Mobility: Supine to Sit;Sit to Supine Supine to Sit: Supervision/Verbal cueing Sit to Supine: Supervision/Verbal cueing Transfers Transfers: Sit to Stand;Stand to Sit;Stand Pivot Transfers Sit to Stand: Contact Guard/Touching assist;Minimal Assistance - Patient > 75% Stand to Sit: Contact Guard/Touching assist;Minimal Assistance - Patient > 75% Stand Pivot Transfers: Contact Guard/Touching assist;Minimal Assistance - Patient > 75% Stand Pivot Transfer Details: Verbal cues for sequencing;Verbal cues for technique;Tactile cues for sequencing;Tactile cues for weight shifting;Verbal cues for precautions/safety;Visual cues/gestures for sequencing Transfer (Assistive device): 1 person hand held assist Locomotion  Gait Ambulation: Yes Gait Assistance: Minimal Assistance - Patient > 75% Gait Distance (Feet): 140 Feet Assistive device: 1 person  hand held assist (R HHA) Gait Assistance Details: Verbal cues for gait pattern;Verbal cues for technique;Verbal cues for sequencing;Tactile cues for weight shifting Gait Gait: Yes Gait Pattern: Impaired Gait Pattern: Step-through pattern;Decreased step length - left;Decreased step length - right;Decreased stride length;Decreased trunk rotation (slight R posterior lean into therapist's support for security) Gait velocity: decreased Stairs / Additional Locomotion Stairs: Yes Stairs Assistance: Minimal Assistance - Patient >  75% Stair Management Technique: Two rails;Step to pattern Number of Stairs: 4 Height of Stairs: 6 Ramp: Minimal Assistance - Patient >75% Curb: Minimal Assistance - Patient >75% Wheelchair Mobility Wheelchair Mobility: No   Discharge Criteria: Patient will be discharged from PT if patient refuses treatment 3 consecutive times without medical reason, if treatment goals not met, if there is a change in medical status, if patient makes no progress towards goals or if patient is discharged from hospital.  The above assessment, treatment plan, treatment alternatives and goals were discussed and mutually agreed upon: by patient and by family  Ginny Forth , PT, DPT, NCS, CSRS 05/29/2023, 10:09 AM

## 2023-05-30 DIAGNOSIS — G9341 Metabolic encephalopathy: Secondary | ICD-10-CM | POA: Diagnosis not present

## 2023-05-30 DIAGNOSIS — K5901 Slow transit constipation: Secondary | ICD-10-CM | POA: Diagnosis not present

## 2023-05-30 LAB — GLUCOSE, CAPILLARY
Glucose-Capillary: 115 mg/dL — ABNORMAL HIGH (ref 70–99)
Glucose-Capillary: 123 mg/dL — ABNORMAL HIGH (ref 70–99)
Glucose-Capillary: 163 mg/dL — ABNORMAL HIGH (ref 70–99)
Glucose-Capillary: 157 mg/dL — ABNORMAL HIGH (ref 70–99)
Glucose-Capillary: 121 mg/dL — ABNORMAL HIGH (ref 70–99)

## 2023-05-30 MED ORDER — NEPRO/CARBSTEADY PO LIQD
237.0000 mL | Freq: Two times a day (BID) | ORAL | Status: DC
  Administered 2023-05-30 – 2023-06-06 (×13): 237 mL via ORAL

## 2023-05-30 NOTE — Progress Notes (Signed)
Initial Nutrition Assessment  DOCUMENTATION CODES:   Not applicable  INTERVENTION:  - Add Nepro Shake po BID, each supplement provides 425 kcal and 19 grams protein  NUTRITION DIAGNOSIS:   Inadequate oral intake related to poor appetite as evidenced by meal completion < 50%.  GOAL:   Patient will meet greater than or equal to 90% of their needs  MONITOR:   PO intake, Supplement acceptance  REASON FOR ASSESSMENT:   Consult Assessment of nutrition requirement/status, Diet education  ASSESSMENT:   72 y.o. female admits to CIR related to functional deficits secondary to metabolic encephalopathy due to cholecystitis. PMH includes: acute pyelonephritis, bacteremia, HLD, PAD, T2DM.  Meds reviewed: lipitor, colace, sliding scale insulin, semglee, miralax, renvela. Labs reviewed: Na low, BUN/Creatinine high.   Pt is spanish-speaking. RD working remotely. Per record, pt has eaten 0-50% of her meals over the past week. Pt is not meeting her needs at this time. RD will add Nepro BID for now. Will continue to monitor PO intakes. Will attempt diet education when PO intakes improve and interpreter available.   NUTRITION - FOCUSED PHYSICAL EXAM:  RD working remotely.   Diet Order:   Diet Order             Diet renal/carb modified with fluid restriction Diet-HS Snack? Nothing; Fluid restriction: 1200 mL Fluid; Room service appropriate? Yes; Fluid consistency: Thin  Diet effective now                   EDUCATION NEEDS:   Not appropriate for education at this time  Skin:  Skin Assessment: Skin Integrity Issues: Skin Integrity Issues:: Incisions Incisions: right lower flank  Last BM:  6/1 - type 6  Height:   Ht Readings from Last 1 Encounters:  05/28/23 5\' 5"  (1.651 m)    Weight:   Wt Readings from Last 1 Encounters:  05/30/23 61.2 kg    Ideal Body Weight:     BMI:  Body mass index is 22.45 kg/m.  Estimated Nutritional Needs:   Kcal:  1530-1835  kcals  Protein:  75-90 gm  Fluid:  </= 1.2 L  Bethann Humble, RD, LDN, CNSC.

## 2023-05-30 NOTE — Progress Notes (Signed)
Patient with c/o dizziness this shift. Vital signs are within normal range. CBG 157. Charge nurse assisted with second assessment. Patient states she usually feels bad after dialysis. Encouraged patient to try to eat meals, but she c/o poor appetite in relation to the way she feels.

## 2023-05-30 NOTE — Progress Notes (Addendum)
PROGRESS NOTE   Subjective/Complaints:  Pt doing alright today, slept ok but not great/much, states she usually doesn't feel great after dialysis and has trouble sleeping-- but doesn't want anything for it. Aware of PRNs. Denies pain. LBM was last night. Urinating well. Denies any other complaints or concerns.   ROS: as per HPI. Denies CP, SOB, abd pain, N/V/D/C, or any other complaints at this time.    Objective:   No results found. Recent Labs    05/28/23 1437 05/29/23 1321  WBC 8.1 6.7  HGB 10.2* 11.5*  HCT 29.5* 32.8*  PLT 328 364   Recent Labs    05/28/23 1437 05/29/23 1321  NA 125* 124*  K 3.5 3.6  CL 85* 86*  CO2 25 22  GLUCOSE 106* 170*  BUN 33* 39*  CREATININE 4.71* 5.26*  CALCIUM 8.4* 8.8*    Intake/Output Summary (Last 24 hours) at 05/30/2023 1051 Last data filed at 05/30/2023 0719 Gross per 24 hour  Intake 295 ml  Output 1350 ml  Net -1055 ml        Physical Exam: Vital Signs Blood pressure 125/76, pulse 73, temperature 98 F (36.7 C), temperature source Oral, resp. rate 18, height 5\' 5"  (1.651 m), weight 61.2 kg, SpO2 100 %.  Constitutional: No apparent distress. Appropriate appearance for age. Tired appearing HENT: No JVD. Neck Supple. Trachea midline. Atraumatic, normocephalic. Eyes: PERRLA. EOMI. Visual fields grossly intact-not reassessed.  Cardiovascular: RRR, no murmurs/rub/gallops. No Edema. Peripheral pulses 2+  Respiratory: CTAB. No rales, rhonchi, or wheezing. On RA.  Abdomen: + bowel sounds, normoactive. No distention or tenderness.   PRIOR EXAMS: Skin: + RUQ drain with green tinged clear fluid output; sutured in; nontender, no erythema.   + Right  IJ TDC dressing dry and intact without tenderness  MSK:      No apparent deformity.      Strength:                RUE: 5/5 SA, 5/5 EF, 5/5 EE, 5/5 WE, 5/5 FF, 5/5 FA                 LUE: 5/5 SA, 5/5 EF, 5/5 EE, 5/5 WE, 5/5 FF, 5/5 FA                  RLE: 5/5 HF, 5/5 KE, 5/5 DF, 5/5 EHL, 5/5 PF                 LLE:  5/5 HF, 5/5 KE, 5/5 DF, 5/5 EHL, 5/5 PF    Neurologic exam:  Cognition: AAO to person; not place or time; able to get city, month, and year Language: Difficult to assess d/t language barrier. Names 2/3 objects correctly.  Memory: Recalls 0/3 objects at 5 minutes. Insight: Good insight into current condition.  Mood: Appropriate mood and affect.  Sensation: To light touch intact in BL UEs and LEs  Reflexes: 2+ in BL UE and LEs. Negative Hoffman's and babinski signs bilaterally.  CN: 2-12 grossly intact.  Coordination: Bilateral upper extremity ataxia - mild. Spasticity: MAS 0 in all extremities  Assessment/Plan: 1. Functional deficits which require 3+ hours per day of interdisciplinary  therapy in a comprehensive inpatient rehab setting. Physiatrist is providing close team supervision and 24 hour management of active medical problems listed below. Physiatrist and rehab team continue to assess barriers to discharge/monitor patient progress toward functional and medical goals  Care Tool:  Bathing    Body parts bathed by patient: Right arm, Left arm, Chest, Abdomen, Front perineal area, Buttocks, Right upper leg, Left upper leg, Right lower leg, Left lower leg, Face         Bathing assist Assist Level: Minimal Assistance - Patient > 75%     Upper Body Dressing/Undressing Upper body dressing   What is the patient wearing?: Pull over shirt    Upper body assist Assist Level: Minimal Assistance - Patient > 75%    Lower Body Dressing/Undressing Lower body dressing            Lower body assist Assist for lower body dressing: Minimal Assistance - Patient > 75%     Toileting Toileting    Toileting assist Assist for toileting: Minimal Assistance - Patient > 75%     Transfers Chair/bed transfer  Transfers assist     Chair/bed transfer assist level: Minimal Assistance - Patient > 75%      Locomotion Ambulation   Ambulation assist      Assist level: Minimal Assistance - Patient > 75% Assistive device: Hand held assist Max distance: 127ft   Walk 10 feet activity   Assist     Assist level: Minimal Assistance - Patient > 75% Assistive device: Hand held assist   Walk 50 feet activity   Assist    Assist level: Minimal Assistance - Patient > 75% Assistive device: Hand held assist    Walk 150 feet activity   Assist Walk 150 feet activity did not occur: Safety/medical concerns         Walk 10 feet on uneven surface  activity   Assist Walk 10 feet on uneven surfaces activity did not occur: Safety/medical concerns         Wheelchair     Assist Is the patient using a wheelchair?: Yes (for transport and energy conservation)      Wheelchair assist level: Dependent - Patient 0%      Wheelchair 50 feet with 2 turns activity    Assist        Assist Level: Dependent - Patient 0%   Wheelchair 150 feet activity     Assist      Assist Level: Dependent - Patient 0%   Blood pressure 125/76, pulse 73, temperature 98 F (36.7 C), temperature source Oral, resp. rate 18, height 5\' 5"  (1.651 m), weight 61.2 kg, SpO2 100 %.   Medical Problem List and Plan:  1. Functional deficits secondary to metabolic encephalopathy due to cholecystitis             - patient may  shower             - ELOS/Goals: 7-10 days, Mod I PT/OT - Of note, workup did show MRI brain w/ punctate acute infarct-not clear if this is artifactual or a incidental finding; neurology deemed presentation more consistent with metabolic encephalopathy and gave no further recommendations.--reviewed findings with daughter again today, 05/29/23   2.  Antithrombotics: -DVT/anticoagulation:  Pharmaceutical: Heparin 5000U Q8H             -antiplatelet therapy: Plavix 75mg  QD   3. Pain Management: Tylenol and robaxin as needed   4. Mood/Behavior/Sleep: LCSW to evaluate and  provide emotional  support             -antipsychotic agents: n/a   -Melatonin and Trazodone PRN -05/30/23 didn't sleep well last night, doesn't want to schedule meds, advised of PRNs  5. Neuropsych/cognition: This patient is capable of making decisions on her own behalf.   6. Skin/Wound Care: Routine skin care checks; pressure relief measures             -routine drain care   7. Fluids/Electrolytes/Nutrition: Strict Is and Os and follow-up chemistries per nephrology             -renal/carb modified diet             -continue protein supplementation, Renvela TID             -1200 cc FR   8: DM-2, A1c = 7.7%             -CGBs QID and carb mod diet, SSI             -continue Semglee 15 units daily             -continue Novolog 3 units TID WC  -05/30/23 CBGs much better today, cont to monitor trend  CBG (last 3)  Recent Labs    05/29/23 2101 05/30/23 0624 05/30/23 0809  GLUCAP 205* 115* 123*      9: Hyperlipidemia: continue Atorvastatin 80mg  QD   10: Hyponatremia: management per nephrology; no intervention or recheck for Na 124 5/30 rec'd on admission  -05/30/23 Na 124 on yesterday's labs, managed by nephro, HD yesterday   11: Acute on chronic diastolic CHF, pulmonary edema: EF 55-60%             -strict Is and Os and daily weight             -1200 cc FR             -continue Coreg 6.25mg  BID  -05/30/23 wt down today, monitor for trend Filed Weights   05/28/23 1400 05/30/23 0444  Weight: 64 kg 61.2 kg      12: Hypertension: continue carvedilol 6.25 mg BID  -6/1-2/24 BPs stable, monitor  Vitals:   05/28/23 1400 05/28/23 1959 05/29/23 0642 05/29/23 1242  BP: 131/76 135/65 130/62 (!) 148/136   05/29/23 1500 05/29/23 1530 05/29/23 1600 05/29/23 1630  BP: 118/72 110/68 110/80 90/65   05/29/23 1711 05/29/23 1718 05/29/23 1938 05/30/23 0444  BP: 123/68 119/66 116/66 125/76      13: Iron deficiency: continue Ferrlecit through 6/02-- completed   14: ESRD on HD via right IJ TDC  on 5/20             -HD on T/T/S schedule             -has been accepted at [facility] in [facility] on TTS 11:40    -Appreciate nephrology assistance   15: Anemia: continue IV iron and follow-up CBC on routine labs   16: Cholelithiasis/cholecystitis s/p cholecystostomy tube -leukocytosis resolved, continue Augmentin through 6/01-- had one dose 05/28/23 and then d/c'd, per pharmacy and d/c summary, end date 5/31 -will need follow up in drain clinic and follow up with Dr. Sheliah Hatch after that to discuss whether she is a candidate for laparoscopic cholecystectomy - Daily flushes 5-10 cc    17: History of CAD: continue Plavix, statin, BB   18: Constipation possible contributing to nausea and lack of appetite             -  continue Colace 100mg  BID and Miralax 17g BID; Dulcolax supp prn  -Continue Zofran 4mg  q6h PRN for nausea  -05/30/23 LBM last night, monitor for now    LOS: 2 days A FACE TO FACE EVALUATION WAS PERFORMED  [address] 05/30/2023, 10:51 AM

## 2023-05-31 DIAGNOSIS — G9341 Metabolic encephalopathy: Secondary | ICD-10-CM | POA: Diagnosis not present

## 2023-05-31 LAB — GLUCOSE, CAPILLARY
Glucose-Capillary: 126 mg/dL — ABNORMAL HIGH (ref 70–99)
Glucose-Capillary: 178 mg/dL — ABNORMAL HIGH (ref 70–99)
Glucose-Capillary: 104 mg/dL — ABNORMAL HIGH (ref 70–99)
Glucose-Capillary: 108 mg/dL — ABNORMAL HIGH (ref 70–99)

## 2023-05-31 MED ORDER — CHLORHEXIDINE GLUCONATE CLOTH 2 % EX PADS
6.0000 | MEDICATED_PAD | Freq: Every day | CUTANEOUS | Status: DC
  Administered 2023-05-31: 6 via TOPICAL

## 2023-05-31 MED ORDER — CHLORHEXIDINE GLUCONATE CLOTH 2 % EX PADS
6.0000 | MEDICATED_PAD | Freq: Two times a day (BID) | CUTANEOUS | Status: DC
  Administered 2023-05-31 – 2023-06-06 (×11): 6 via TOPICAL

## 2023-05-31 NOTE — Progress Notes (Addendum)
PROGRESS NOTE   Subjective/Complaints:  Daughter at bedside , interpreting   Discussed that pt will be going home with drain   Discussed d/c date to be set on 6/5  ROS: as per HPI. Denies CP, SOB, abd pain, N/V/D/C, or any other complaints at this time.    Objective:   No results found. Recent Labs    05/28/23 1437 05/29/23 1321  WBC 8.1 6.7  HGB 10.2* 11.5*  HCT 29.5* 32.8*  PLT 328 364    Recent Labs    05/28/23 1437 05/29/23 1321  NA 125* 124*  K 3.5 3.6  CL 85* 86*  CO2 25 22  GLUCOSE 106* 170*  BUN 33* 39*  CREATININE 4.71* 5.26*  CALCIUM 8.4* 8.8*     Intake/Output Summary (Last 24 hours) at 05/31/2023 1100 Last data filed at 05/31/2023 0855 Gross per 24 hour  Intake 340 ml  Output 400 ml  Net -60 ml         Physical Exam: Vital Signs Blood pressure 121/84, pulse 76, temperature 98.2 F (36.8 C), resp. rate 15, height 5\' 5"  (1.651 m), weight 61 kg, SpO2 99 %.   General: No acute distress Mood and affect are appropriate Heart: Regular rate and rhythm no rubs murmurs or extra sounds Lungs: Clear to auscultation, breathing unlabored, no rales or wheezes Abdomen: Positive bowel sounds, RUQ mildly tender to palpation, nondistended Extremities: No clubbing, cyanosis, or edema Skin: No evidence of breakdown, no evidence of rash, chole tube site CDI no erythema or induration  Neurologic: Cranial nerves II through XII intact, motor strength is 5/5 in bilateral deltoid, bicep, tricep, grip, hip flexor, knee extensors, ankle dorsiflexor and plantar flexor  Cerebellar exam normal finger to nose to finger as well as heel to shin in bilateral upper and lower extremities Musculoskeletal: Full range of motion in all 4 extremities. No joint swelling    Lines-  + RUQ drain with green tinged clear fluid output; sutured in; nontender, no erythema.   + Right  IJ TDC dressing dry and intact without  tenderness    Assessment/Plan: 1. Functional deficits which require 3+ hours per day of interdisciplinary therapy in a comprehensive inpatient rehab setting. Physiatrist is providing close team supervision and 24 hour management of active medical problems listed below. Physiatrist and rehab team continue to assess barriers to discharge/monitor patient progress toward functional and medical goals  Care Tool:  Bathing    Body parts bathed by patient: Right arm, Left arm, Chest, Abdomen, Front perineal area, Buttocks, Right upper leg, Left upper leg, Right lower leg, Left lower leg, Face         Bathing assist Assist Level: Minimal Assistance - Patient > 75%     Upper Body Dressing/Undressing Upper body dressing   What is the patient wearing?: Pull over shirt    Upper body assist Assist Level: Contact Guard/Touching assist    Lower Body Dressing/Undressing Lower body dressing      What is the patient wearing?: Pants     Lower body assist Assist for lower body dressing: Contact Guard/Touching assist     Toileting Toileting    Toileting assist Assist for toileting:  Minimal Assistance - Patient > 75%     Transfers Chair/bed transfer  Transfers assist     Chair/bed transfer assist level: Minimal Assistance - Patient > 75%     Locomotion Ambulation   Ambulation assist      Assist level: Minimal Assistance - Patient > 75% Assistive device: Walker-rolling Max distance: 50   Walk 10 feet activity   Assist     Assist level: Minimal Assistance - Patient > 75% Assistive device: Walker-rolling   Walk 50 feet activity   Assist    Assist level: Minimal Assistance - Patient > 75% Assistive device: Walker-rolling    Walk 150 feet activity   Assist Walk 150 feet activity did not occur: Safety/medical concerns         Walk 10 feet on uneven surface  activity   Assist Walk 10 feet on uneven surfaces activity did not occur: Safety/medical  concerns         Wheelchair     Assist Is the patient using a wheelchair?: Yes (for transport and energy conservation) Type of Wheelchair: Manual    Wheelchair assist level: Dependent - Patient 0%      Wheelchair 50 feet with 2 turns activity    Assist        Assist Level: Dependent - Patient 0%   Wheelchair 150 feet activity     Assist      Assist Level: Dependent - Patient 0%   Blood pressure 121/84, pulse 76, temperature 98.2 F (36.8 C), resp. rate 15, height 5\' 5"  (1.651 m), weight 61 kg, SpO2 99 %.   Medical Problem List and Plan:  1. Functional deficits secondary to metabolic encephalopathy due to cholecystitis             - patient may  shower             - ELOS/Goals: 7-10 days, Mod I PT/OT - Of note, workup did show MRI brain w/ punctate acute infarct-not clear if this is artifactual or a incidental finding; neurology deemed presentation more consistent with metabolic encephalopathy and gave no further recommendations.--reviewed findings with daughter again today, 05/29/23   2.  Antithrombotics: -DVT/anticoagulation:  Pharmaceutical: Heparin 5000U Q8H             -antiplatelet therapy: Plavix 75mg  QD   3. Pain Management: Tylenol and robaxin as needed   4. Mood/Behavior/Sleep: LCSW to evaluate and provide emotional support             -antipsychotic agents: n/a   -Melatonin and Trazodone PRN -05/30/23 didn't sleep well last night, doesn't want to schedule meds, advised of PRNs  5. Neuropsych/cognition: This patient is capable of making decisions on her own behalf.   6. Skin/Wound Care: Routine skin care checks; pressure relief measures             -routine drain care   7. Fluids/Electrolytes/Nutrition: Strict Is and Os and follow-up chemistries per nephrology             -renal/carb modified diet             -continue protein supplementation, Renvela TID             -1200 cc FR   8: DM-2, A1c = 7.7%             -CGBs QID and carb mod  diet, SSI             -continue Semglee 15 units daily             -  continue Novolog 3 units TID WC  -05/30/23 CBGs much better today, cont to monitor trend  CBG (last 3)  Recent Labs    05/30/23 1651 05/30/23 2115 05/31/23 0609  GLUCAP 157* 121* 126*       9: Hyperlipidemia: continue Atorvastatin 80mg  QD   10: Hyponatremia: management per nephrology; no intervention or recheck for Na 124 5/30 rec'd on admission  -05/30/23 Na 124 on yesterday's labs, managed by nephro, HD yesterday   11: Acute on chronic diastolic CHF, pulmonary edema: EF 55-60%             -strict Is and Os and daily weight             -1200 cc FR             -continue Coreg 6.25mg  BID  -05/30/23 wt down today, monitor for trend Filed Weights   05/28/23 1400 05/30/23 0444 05/31/23 0500  Weight: 64 kg 61.2 kg 61 kg      12: Hypertension: continue carvedilol 6.25 mg BID  -6/1-2/24 BPs stable, monitor  Vitals:   05/29/23 1500 05/29/23 1530 05/29/23 1600 05/29/23 1630  BP: 118/72 110/68 110/80 90/65   05/29/23 1711 05/29/23 1718 05/29/23 1938 05/30/23 0444  BP: 123/68 119/66 116/66 125/76   05/30/23 1313 05/30/23 1700 05/30/23 2003 05/31/23 0607  BP: 126/77 117/67 128/65 121/84      13: Iron deficiency: continue Ferrlecit through 6/02-- completed   14: ESRD on HD via right IJ TDC on 5/20             -HD on T/T/S schedule             -has been accepted at [facility] in [facility] on TTS 11:40    -Appreciate nephrology assistance   15: Anemia: continue IV iron and follow-up CBC on routine labs   16: Cholelithiasis/cholecystitis s/p cholecystostomy tube -leukocytosis resolved, continue Augmentin through 6/01-- had one dose 05/28/23 and then d/c'd, per pharmacy and d/c summary, end date 5/31 -will need follow up in drain clinic and follow up with Dr. Sheliah Hatch after that to discuss whether she is a candidate for laparoscopic cholecystectomy - Daily flushes 5-10 cc    17: History of CAD: continue Plavix,  statin, BB   18: Constipation possible contributing to nausea and lack of appetite             -continue Colace 100mg  BID and Miralax 17g BID; Dulcolax supp prn  -Continue Zofran 4mg  q6h PRN for nausea  -05/30/23 LBM last night, monitor for now    LOS: 3 days A FACE TO FACE EVALUATION WAS PERFORMED  Victorino Sparrow Bryley Kovacevic 05/31/2023, 11:00 AM

## 2023-05-31 NOTE — Progress Notes (Signed)
Occupational Therapy Session Note  Patient Details  Name: Samantha [name] MRN: [id number] Date of Birth: 1951/07/26  Today's Date: 05/31/2023 OT Individual Time: 4696-2952 OT Individual Time Calculation (min): 60 min    Short Term Goals: Week 1:  OT Short Term Goal 1 (Week 1): STG=LTG d.t ELOS  Skilled Therapeutic Interventions/Progress Updates:     Pt received semi-reclined in bed with DTR and interpreter present in room to support optimal participation and communication throughout session. Pt presenting to be in good spirits receptive to skilled OT session reporting 2/10 pain d/t headache- OT offering intermittent rest breaks, repositioning, and therapeutic support to optimize participation in therapy session. Pt politely refusing to request pain medications at this time.   Pt transitioned to EOB with CGA. Pt receptive to walking to therapy gym. Sit>stand using RW light min A. Pt able to ambulate ~50 ft using RW CGA with intermittent standing rest breaks. Pt reporting fatigue and transported remainder of the way to therapy gym total A in wc.   Stand step WC>EOM using RW CGA. Engaged Pt in standing bean bag toss activity with posterior reaching incorporated into task to simulate completing posterior peri-care and clothing management during BADLs. Pt able to completed with CGA for balance with BLEs also receiving posterior support from edge of mat. Pt provided prolonged rest break and therapeutic support to optimize participation in remainder of session. Pt politely refusing to stand following d/t fatigue. Graded down task to complete in sitting position to work on dynamic sitting balance. Pt able to complete at [facility] level with rest break provided following.   Worked on completing sit>stands while on standing on compliant surface for increased balance challenge. Pt able to complete 3x5 reps with maximal therapeutic support and encouragement provided. Pt self-limiting during tasks stating "I  cannot do it", how ever Pt able to complete with CGA-light min A with min verbal cues for technique and body mechanics. Prolonged rest breaks provided between sets and reps to optimize participation and motivation for therapy session.   Pt completed stand step EOM>wc using RW CGA to allow for posterior trunk support for remainder of therapy session. Engaged Pt in seated UB therex using 1# weighted dowel to increase UB strength, endurance, and functional use during BADLs. Pt able to complete 1x10 reps of bicep curls, anterior deltoid raises, over head press, and chest press with min verbal cues to utilize breath during movement and OT providing model of exercises for technique.   Pt fatigued at end of session. Transported total A back to room for energy conservation. Pt nervous regarding upcoming therapy sessions with therapeutic support, education, and motivation provided with mod improvement in moral. Pt was handed off to nursing staff at the end of session to assist with toileting needs with all immediate needs met.    Therapy Documentation Precautions:  Precautions Precautions: Fall, Other (comment) Precaution Comments: denies falls in past 6 months, drain on R side Restrictions Weight Bearing Restrictions: No  Therapy/Group: Individual Therapy  Army Fossa 05/31/2023, 7:55 AM

## 2023-05-31 NOTE — Progress Notes (Signed)
Inpatient Rehabilitation  Patient information reviewed and entered into eRehab system by Romie Keeble M. Munira Polson, M.A., CCC/SLP, PPS Coordinator.  Information including medical coding, functional ability and quality indicators will be reviewed and updated through discharge.    

## 2023-05-31 NOTE — Progress Notes (Signed)
Physical Therapy Session Note  Patient Details  Name: Samantha [name] MRN: [id number] Date of Birth: 06/30/1951  Today's Date: 05/31/2023 PT Individual Time: 0805-0900 and 1402-1500 PT Individual Time Calculation (min): 55 min and 58 min.  Short Term Goals: Week 1:  PT Short Term Goal 1 (Week 1): = to LTGs based on ELOS  Skilled Therapeutic Interventions/Progress Updates:   First session:  Pt presents semi-reclind in bed and agreeable to therapy through interpreter.  Pt transferred to long-sitting on bed and donned pull over shirt and socks.  Pt scooted to EOB and able to thread shorts over feet.  Pt donned shoes and transferred sit to stand w/ CGA, steadying for managing clothing.  Pt amb x 50' w/ RW into hallway.  Pt states fatigue and w/c brought behind.  PT wheeled to dayroom for energy conservation.  Pt performed LE there ex, stating feeling fatigued.  BP monitored at 135/75, O2 100% on RA.  Pt performed calf raises, LAQ, marching, abd/add 3 x 10-15.  Pt requires seated rest breaks 2/2 fatigue.  Pt amb x 10' w/ HHA to bed and required supervision for sit to supine.  Bed alarm on and all needs in reach.   Second session:  Pt presents sitting towards the bottom of the bed around the siderail.  Side rail lowered and pt scoots to EOB.  Pt reluctantly agreeable to therapy, c/o not feeling well.  Pt states some dizziness and BP checked at 131/79.  Pt requires encouragement and reassurance that therapy is needed for improved mobility and return to home.  PT educated that pt will have some pain , or discomfort and fatigue w/ therapy, but it is important to participate w/ therapy to return to PLOF.  Pt states understanding but continues to require encouragement to participate.  Pt transfers step-pivot w/ HHA to Nu-step.  Pt performs x 4' at Level 1 w/ 1 rest break required.  Pt averaged 32 spm for a total of 86 steps.  Pt performed multiple sit to stand transfers and standing x 1' each w/ c/o  dizziness/fatigue.  Pt also now c/o L thorax pain 8/10.  Notified RN of pain and request for pain meds as well as something for anxiety/tenseness through interpreter.  Pt returned to room and performed min/HHA for step-pivot to [facility].  Pt manages clothing.  Pt handed off to NT.       Therapy Documentation Precautions:  Precautions Precautions: Fall, Other (comment) Precaution Comments: denies falls in past 6 months, drain on R side Restrictions Weight Bearing Restrictions: No General:   Vital Signs:   Pain:no c/o pain. Pain Assessment Pain Scale: 0-10 Pain Score: 0-No pain    Therapy/Group: Individual Therapy  Lucio Edward 05/31/2023, 11:35 AM

## 2023-05-31 NOTE — Progress Notes (Signed)
Samantha [name] NEPHROLOGY PROGRESS NOTE  Assessment/ Plan: Pt is a 72 y.o. yo female  with past medical history significant for hypertension, type II DM, PAD, CVA, HLD, CKD 3B who presented with shortness of breath and fluid overload.   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  OP HD was arranged at [facility] in [facility] on TTS 11:40 chair time. Plan for PD later on it seems but will need to wait until cholecystitis is better. If not going to pursue PD will need AV access down the line.  Next HD tomorrow.   # Acute hypoxic respiratory failure due to fluid overload: Received thoracocentesis and diuresis.  Resolved. Now managing volume with HD.   # HTN/volume: BP acceptable.  No edema.  UF as tolerated.  Only on low dose coreg.  I think we have achieved EDW    # Anemia: Hemoglobin at goal-  no treatment needed as of yet but getting close-  iron low, repleting.   # Acute cholecystitis: HIDA scan was positive therefore on antibiotics.  Seen by general surgery and IR.  S/p cholecystostomy tube placement.   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  # Hyponatremia: Now managing with with dialysis.  Recommend fluid restriction.  Subjective: Seen and examined at bedside.  Denies nausea, vomiting, chest pain, shortness of breath.  Her daughter was presented at the bedside. Objective Vital signs in last 24 hours: Vitals:   05/30/23 1700 05/30/23 2003 05/31/23 0500 05/31/23 0607  BP: 117/67 128/65  121/84  Pulse: 84 81  76  Resp:  18  15  Temp:  98.5 F (36.9 C)  98.2 F (36.8 C)  TempSrc:  Oral    SpO2: 99% 99%  99%  Weight:   61 kg   Height:       Weight change: -0.2 kg  Intake/Output Summary (Last 24 hours) at 05/31/2023 0938 Last data filed at 05/31/2023 0855 Gross per 24 hour   Intake 340 ml  Output 400 ml  Net -60 ml       Labs: RENAL PANEL Recent Labs  Lab 05/25/23 0218 05/27/23 0443 05/28/23 1437 05/29/23 1321  NA 128* 124* 125* 124*  K 3.6 4.1 3.5 3.6  CL 85* 84* 85* 86*  CO2 20* 23 25 22   GLUCOSE 117* 104* 106* 170*  BUN 95* 58* 33* 39*  CREATININE 8.39* 6.48* 4.71* 5.26*  CALCIUM 8.1*  9.0 8.8* 8.4* 8.8*  PHOS 7.9* 6.2* 3.8 2.8  ALBUMIN 2.1* 2.3* 2.4* 2.9*    Liver Function Tests: Recent Labs  Lab 05/27/23 0443 05/28/23 1437 05/29/23 1321  ALBUMIN 2.3* 2.4* 2.9*   No results for input(s): "LIPASE", "AMYLASE" in the last 168 hours. No results for input(s): "AMMONIA" in the last 168 hours. CBC: Recent Labs    05/23/23 1335 05/25/23 0218 05/27/23 0443 05/28/23 1437 05/29/23 1321  HGB 10.8* 10.0* 10.4* 10.2* 11.5*  MCV 91.6 89.6 91.1 91.9 90.9  FERRITIN  --  632*  --   --   --   TIBC  --  172*  --   --   --   IRON  --  13*  --   --   --     Cardiac Enzymes: No results for input(s): "CKTOTAL", "CKMB", "CKMBINDEX", "TROPONINI" in the last 168 hours.  CBG: Recent Labs  Lab 05/30/23 0809 05/30/23 1123 05/30/23 1651 05/30/23 2115 05/31/23 0609  GLUCAP 123* 163* 157* 121* 126*    Iron Studies: No results for input(s): "IRON", "TIBC", "TRANSFERRIN", "FERRITIN" in the last 72 hours. Studies/Results: No results found.  Medications: Infusions:  sodium chloride      Scheduled Medications:  atorvastatin  80 mg Oral Daily   carvedilol  6.25 mg Oral BID WC   Chlorhexidine Gluconate Cloth  6 each Topical Q0600   Chlorhexidine Gluconate Cloth  6 each Topical Q0600   clopidogrel  75 mg Oral Daily   docusate sodium  100 mg Oral BID   feeding supplement (NEPRO CARB STEADY)  237 mL Oral BID BM   guaiFENesin  600 mg Oral BID   heparin injection (subcutaneous)  5,000 Units Subcutaneous Q8H   insulin aspart  0-15 Units Subcutaneous TID WC   insulin aspart  0-5 Units Subcutaneous QHS   insulin aspart  3 Units Subcutaneous  TID WC   insulin glargine-yfgn  15 Units Subcutaneous Daily   polyethylene glycol  17 g Oral BID   sevelamer carbonate  800 mg Oral TID WC    have reviewed scheduled and prn medications.  Physical Exam: General:NAD, comfortable Heart:RRR, s1s2 nl Lungs:clear b/l, no crackle Abdomen:soft, cholecystostomy drain in place. Extremities:No edema Dialysis Access: TDC.  Chidera Thivierge Prasad Amiree No 05/31/2023,9:38 AM  LOS: 3 days

## 2023-05-31 NOTE — Progress Notes (Signed)
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 [facility])  Past Medical History:  Past Medical History:  Diagnosis Date   Acute pyelonephritis    Bacteremia, escherichia coli 01/12/2015   Chest pain    Critical lower limb ischemia (HCC)    Hyperlipidemia    Hypertension    Left carotid bruit    PAD (peripheral artery disease) (HCC)    Type II diabetes mellitus (HCC)    Past Surgical History:  Past Surgical History:  Procedure Laterality Date   ABDOMINAL ANGIOGRAM  05/16/2015   Procedure: Abdominal Angiogram;  Surgeon: Runell Gess, MD;  Location: [facility] INVASIVE CV LAB;  Service: Cardiovascular;;   ABDOMINAL HYSTERECTOMY  ~ 2000   IR FLUORO GUIDE CV LINE RIGHT  05/17/2023   IR PERC CHOLECYSTOSTOMY  05/23/2023   IR US GUIDE VASC ACCESS RIGHT  05/17/2023   PERIPHERAL VASCULAR CATHETERIZATION Bilateral 05/16/2015   Procedure: Lower Extremity Angiography;  Surgeon: Runell Gess, MD; renal arteries widely patent, L-SFA 75%, L-pop 95%, L-peroneal 90%; R-SFA 40%, R-pop 50%, R-prox peroneal 90%; directional atherectomy L-SFA, p-pop, drug-eluting balloon angioplasty reducing the stenoses to 0, small linear dissection was not flow limiting       PERIPHERAL VASCULAR CATHETERIZATION Left 05/16/2015   Procedure: Peripheral Vascular Atherectomy;  Surgeon: Runell Gess, MD;  Location: [facility] INVASIVE CV LAB;  Service: Cardiovascular;  Laterality: Left;  sfa   Social History:  reports that she has never smoked. She has never used smokeless tobacco. She reports current alcohol use of about 1.0 - 2.0 standard drink of alcohol per week. She reports that she does not use drugs.  Family / Support  Systems Marital Status: Widow/Widower How Long?: n/a Patient Roles: Parent, Other (Comment) Spouse/Significant Other: n/a Children: Samantha [name] (daughter) and Samantha [name] (daughter) Other Supports: n/a Anticipated Caregiver: Samantha [name], [phone] Ability/Limitations of Caregiver: Pt and family anticipating MOD I Caregiver Availability: 24/7 Family Dynamics: daughters  Social History Preferred language: Spanish Religion: Catholic Cultural Background: independent overall Education: HS Health Literacy - How often do you need to have someone help you when you read instructions, pamphlets, or other written material from your doctor or pharmacy?: Often Writes: Yes Employment Status: Unemployed Marine scientist Issues: n/a Guardian/Conservator: n/a   Abuse/Neglect Abuse/Neglect Assessment Can Be Completed: Yes Physical Abuse: Denies Verbal Abuse: Denies Sexual Abuse: Denies Exploitation of patient/patient's resources: Denies Self-Neglect: Denies  Patient response to: Social Isolation - How often do you feel lonely or isolated from those around you?: Never  Emotional Status Pt's affect, behavior and adjustment status: Pleasant (spanish speaking), daughter at bedside prefers to translate, pleasant. Recent Psychosocial Issues: coping Psychiatric History: hx of anxiety and depression Substance Abuse History: n/a  Patient / Family Perceptions, Expectations & Goals Pt/Family understanding of illness & functional limitations: yes, daughter at bedside Premorbid pt/family roles/activities: Independent overall Anticipated changes in roles/activities/participation: Samantha [name], Daughter currently works and antipating MOD I goals for patient. Daughter shared she can assist up to Min A and potential to leave job if patient requires 24/7 supervision. Pt/family expectations/goals: MOD I Goals  Building surveyor: None Premorbid Home Care/DME Agencies:  Other (Comment) ([facility] and [facility]) Transportation available at discharge: daughters Is the patient able to respond to transportation needs?: Yes In the past  12 months, has lack of transportation kept you from medical appointments or from getting medications?: No In the past 12 months, has lack of transportation kept you from meetings, work, or from getting things needed for daily living?: No Resource referrals recommended: Neuropsychology (hx of anxiety and depression)  Discharge Planning Living Arrangements: Children Support Systems: Children Type of Residence: Private residence Insurance Resources: Media planner (specify) ([employer] Medicaid of [place]) Financial Resources: Tree surgeon, Family Support Financial Screen Referred: No Living Expenses: Rent Money Management: Family, Patient Does the patient have any problems obtaining your medications?: No Home Management: Independent Patient/Family Preliminary Plans: Daughter able to assist if needed. Anticpating MOD I goals Care Coordinator Barriers to Discharge: Insurance for SNF coverage, Decreased caregiver support, Lack of/limited family support Care Coordinator Barriers to Discharge Comments: Daughter works and has expressed leaving job if patient requires 24/7 supervision. Care Coordinator Anticipated Follow Up Needs: HH/OP DC Planning Additional Notes/Comments: HD: TTS Expected length of stay: 7-10 Days  Clinical Impression Sw met with patient and daughter, Samantha [name] at bedside. Introduced self and explained role. Patient anticipates discharging home MOD I due to daughters working. Daughter, Samantha [name] expressed she works but if patient requires 24/7 supervision, she [place] potential to leave her job but anticipating MOD I goals. Daughter can be reached at [phone]. No additional questions or concerns.   Andria Rhein 05/31/2023, 1:02 PM

## 2023-05-31 NOTE — Progress Notes (Signed)
Patient c/o pain to IV site. Nurse noticed IV site to not be in good working condition, removed IV.

## 2023-05-31 NOTE — Progress Notes (Signed)
Inpatient Rehabilitation Center Individual Statement of Services  Patient Name:  Alaena Caul  Date:  05/31/2023  Welcome to the Inpatient Rehabilitation Center.  Our goal is to provide you with an individualized program based on your diagnosis and situation, designed to meet your specific needs.  With this comprehensive rehabilitation program, you will be expected to participate in at least 3 hours of rehabilitation therapies Monday-Friday, with modified therapy programming on the weekends.  Your rehabilitation program will include the following services:  Physical Therapy (PT), Occupational Therapy (OT), Speech Therapy (ST), 24 hour per day rehabilitation nursing, Therapeutic Recreaction (TR), Neuropsychology, Care Coordinator, Rehabilitation Medicine, Pharmacy Services, and Other  Weekly team conferences will be held on Wednesdays to discuss your progress.  Your Inpatient Rehabilitation Care Coordinator will talk with you frequently to get your input and to update you on team discussions.  Team conferences with you and your family in attendance may also be held.  Expected length of stay: 7-10 Days  Overall anticipated outcome:  MOD I  Depending on your progress and recovery, your program may change. Your Inpatient Rehabilitation Care Coordinator will coordinate services and will keep you informed of any changes. Your Inpatient Rehabilitation Care Coordinator's name and contact numbers are listed  below.  The following services may also be recommended but are not provided by the Inpatient Rehabilitation Center:   Home Health Rehabiltiation Services Outpatient Rehabilitation Services    Arrangements will be made to provide these services after discharge if needed.  Arrangements include referral to agencies that provide these services.  Your insurance has been verified to be:  [place] MEDICAID Your primary doctor is:  Hoy Register, MD  Pertinent information will be shared with your  doctor and your insurance company.  Inpatient Rehabilitation Care Coordinator:  Lavera Guise, [place] [phone] or [phone]  Information discussed with and copy given to patient by: Andria Rhein, 05/31/2023, 10:07 AM

## 2023-05-31 NOTE — Progress Notes (Signed)
Patient not eating meals due to not wanting foods on her tray, she did not want the meat and vegetables. She states she will eat the bread roll if she had milk. Called dietary for milk, was informed patient could not have it due to restrictions. Asked if milk could be sent up starting tomorrow as patient likes meal when she eats. Therefore, patient would not eat bread roll. Patient states without milk she gets nauseated. Patient ate some peanut butter with crackers.

## 2023-06-01 DIAGNOSIS — G9341 Metabolic encephalopathy: Secondary | ICD-10-CM | POA: Diagnosis not present

## 2023-06-01 LAB — RENAL FUNCTION PANEL
Albumin: 2.7 g/dL — ABNORMAL LOW (ref 3.5–5.0)
Anion gap: 14 (ref 5–15)
BUN: 41 mg/dL — ABNORMAL HIGH (ref 8–23)
CO2: 23 mmol/L (ref 22–32)
Calcium: 8.6 mg/dL — ABNORMAL LOW (ref 8.9–10.3)
Chloride: 85 mmol/L — ABNORMAL LOW (ref 98–111)
Creatinine, Ser: 5.32 mg/dL — ABNORMAL HIGH (ref 0.44–1.00)
GFR, Estimated: 8 mL/min — ABNORMAL LOW (ref >60)
Glucose, Bld: 209 mg/dL — ABNORMAL HIGH (ref 70–99)
Phosphorus: 3.1 mg/dL (ref 2.5–4.6)
Potassium: 3.1 mmol/L — ABNORMAL LOW (ref 3.5–5.1)
Sodium: 122 mmol/L — ABNORMAL LOW (ref 135–145)

## 2023-06-01 LAB — GLUCOSE, CAPILLARY
Glucose-Capillary: 209 mg/dL — ABNORMAL HIGH (ref 70–99)
Glucose-Capillary: 139 mg/dL — ABNORMAL HIGH (ref 70–99)
Glucose-Capillary: 159 mg/dL — ABNORMAL HIGH (ref 70–99)
Glucose-Capillary: 153 mg/dL — ABNORMAL HIGH (ref 70–99)
Glucose-Capillary: 158 mg/dL — ABNORMAL HIGH (ref 70–99)

## 2023-06-01 LAB — CBC
HCT: 28.4 % — ABNORMAL LOW (ref 36.0–46.0)
Hemoglobin: 9.8 g/dL — ABNORMAL LOW (ref 12.0–15.0)
MCH: 31.2 pg (ref 26.0–34.0)
MCHC: 34.5 g/dL (ref 30.0–36.0)
MCV: 90.4 fL (ref 80.0–100.0)
Platelets: 325 10*3/uL (ref 150–400)
RBC: 3.14 MIL/uL — ABNORMAL LOW (ref 3.87–5.11)
RDW: 12.5 % (ref 11.5–15.5)
WBC: 7.2 10*3/uL (ref 4.0–10.5)
nRBC: 0 % (ref 0.0–0.2)

## 2023-06-01 LAB — HEPATITIS B SURFACE ANTIBODY, QUANTITATIVE: Hep B S AB Quant (Post): <3.5 m[IU]/mL — ABNORMAL LOW (ref >9.9)

## 2023-06-01 NOTE — Progress Notes (Signed)
Physical Therapy Session Note  Patient Details  Name: Samantha [name] MRN: [id number] Date of Birth: 10-14-1951  Today's Date: 06/01/2023 PT Individual Time: 0905-1006 and 1134-1206 PT Individual Time Calculation (min): 61 min and 32 min   Short Term Goals: Week 1:  PT Short Term Goal 1 (Week 1): = to LTGs based on ELOS  Skilled Therapeutic Interventions/Progress Updates:    Session 1: Pt received sitting in w/c and agreeable to therapy session, but reporting fatigue and states she did not rest well last night due to emesis. Transported to/from gym in w/c for time management and energy conservation. Sit<>stands to R HHA with CGA/light min assist. Gait training ~32ft + ~13ft using R HHA with light min assist for balance as pt tends to lean backwards into therapist's support - continues to have significantly decreased gait speed although achieves reciprocal stepping pattern. Pt reporting significant fatigue after gait training and reports overall not feeling well, but with difficulty describing symptoms - mentioned something about opaque/cloudiness in her vision (which pt had reported during PT eval) but otherwise just states she is exhausted. Discovered via pt/family report that she is not eating well so transported to nurses station to have blood sugar checked and it was WNL Assessed vitas: BP 134/70 (MAP 80), HR 87bpm Pt provided Nepro from nurse and therapist encouraged increased intake. Discussed with Nursing Care Coordinator having family bring in food from home to improve intake and ensuring family receives education on pt's diet recommendations. With max encouragement, pt agreeable to one final ambulation trial of ~53ft with R HHA as described above. Pt requesting to return to bed and rest. Sit>supine supervision progressing to mod-I. Pt left supine in bed with needs in reach and her daughter present.   Session 2: Pt received sitting in w/c and is agreeable to therapy session with  encouragement. In-person interpreter, De Nurse, present  throughout session and pt's daughter, Debarah Crape, present throughout as well. Transported to/from gym in w/c for time management and energy conservation. Sit<>stand to R HHA with CGA/light min assist. Gait training ~152ft using R HHA with light min assist for balance with pt continuing to rely slightly on therapist's support behind her for balance. Pt continues to report significant fatigue and feeling like her LEs are going to buckle; however, no obvious muscle fatigue via fasciculations or beginnings of instability noted. Pt reporting needing to transition to seated activity at this time due to fatigue and inability to continue with gait training. Seated B LE long arc quads with 4lb ankle weights 2x15 reps each LE with pt requiring encouragement to finish second set bilaterally. After seated therapeutic rest break, educated pt on plan to perform standing heel raises; however, upon pt attempt at initiating coming to stand she states "I can't" reporting her LEs are too fatigued and despite encouragement, she continues to report she is unable. Pt requesting to return to her room and rest. Transported back in w/c. R stand pivot w/c>EOB, no AD, with min assist for balance (no instability in LEs); however, pt's R drain line getting tugged on w/c armrest, when coming to stand therapist assessed and line intact with suture in place. Pt requesting to change into gown for dialysis - performed with only min assist for energy conservation. Sit<>supine on bed with supervision progressing to mod-I. Pt left supine in bed with needs in reach, bed alarm on, and her daughter present.   Therapy Documentation Precautions:  Precautions Precautions: Fall, Other (comment) Precaution Comments: denies falls in past 6 months,  drain on R side Restrictions Weight Bearing Restrictions: No   Pain:  Session 1: Reports she starts to have numbness in her hands R>L when she becomes  tired - pt with difficulty elaborating further at this time. Reports L lower abdominal pain - discovered pt has not had BM in 3-4 days - made MD/PA, RN, and therapy team aware to follow-up/address as needed.  Session 2: Reports R hand numbness is located more specifically in 4th and 5th digits - anticipate she may be causing some ulnar nerve compression while resting arms on the w/c armrests, which she tends to do more when she is fatigued - educated pt on this.    Therapy/Group: Individual Therapy  Ginny Forth , PT, DPT, NCS, CSRS 06/01/2023, 7:52 AM

## 2023-06-01 NOTE — Progress Notes (Signed)
PROGRESS NOTE   Subjective/Complaints:  Pain around chole drain , which is at the mid axillary line   ROS: as per HPI. Denies CP, SOB, abd pain, N/V/D/C, or any other complaints at this time.    Objective:   No results found. Recent Labs    05/29/23 1321  WBC 6.7  HGB 11.5*  HCT 32.8*  PLT 364    Recent Labs    05/29/23 1321  NA 124*  K 3.6  CL 86*  CO2 22  GLUCOSE 170*  BUN 39*  CREATININE 5.26*  CALCIUM 8.8*     Intake/Output Summary (Last 24 hours) at 06/01/2023 0858 Last data filed at 06/01/2023 0836 Gross per 24 hour  Intake 413 ml  Output 325 ml  Net 88 ml         Physical Exam: Vital Signs Blood pressure 136/66, pulse 80, temperature 98.5 F (36.9 C), temperature source Oral, resp. rate 18, height 5\' 5"  (1.651 m), weight 61 kg, SpO2 100 %.   General: No acute distress Mood and affect are appropriate Heart: Regular rate and rhythm no rubs murmurs or extra sounds Lungs: Clear to auscultation, breathing unlabored, no rales or wheezes Abdomen: Positive bowel sounds, RUQ mildly tender to palpation, nondistended Extremities: No clubbing, cyanosis, or edema Skin: No evidence of breakdown, no evidence of rash, chole tube site CDI no erythema or induration , mild tenderness , sutures intact  Neurologic: Cranial nerves II through XII intact, motor strength is 5/5 in bilateral deltoid, bicep, tricep, grip, hip flexor, knee extensors, ankle dorsiflexor and plantar flexor  Cerebellar exam normal finger to nose to finger as well as heel to shin in bilateral upper and lower extremities Musculoskeletal: Full range of motion in all 4 extremities. No joint swelling    Lines-  + RUQ drain with green tinged clear fluid output; sutured in; nontender, no erythema.   + Right  IJ TDC dressing dry and intact without tenderness    Assessment/Plan: 1. Functional deficits which require 3+ hours per day of  interdisciplinary therapy in a comprehensive inpatient rehab setting. Physiatrist is providing close team supervision and 24 hour management of active medical problems listed below. Physiatrist and rehab team continue to assess barriers to discharge/monitor patient progress toward functional and medical goals  Care Tool:  Bathing    Body parts bathed by patient: Right arm, Left arm, Chest, Abdomen, Front perineal area, Buttocks, Right upper leg, Left upper leg, Right lower leg, Left lower leg, Face         Bathing assist Assist Level: Minimal Assistance - Patient > 75%     Upper Body Dressing/Undressing Upper body dressing   What is the patient wearing?: Pull over shirt    Upper body assist Assist Level: Contact Guard/Touching assist    Lower Body Dressing/Undressing Lower body dressing      What is the patient wearing?: Pants     Lower body assist Assist for lower body dressing: Contact Guard/Touching assist     Toileting Toileting    Toileting assist Assist for toileting: Contact Guard/Touching assist     Transfers Chair/bed transfer  Transfers assist     Chair/bed transfer assist  level: Minimal Assistance - Patient > 75%     Locomotion Ambulation   Ambulation assist      Assist level: Minimal Assistance - Patient > 75% Assistive device: Walker-rolling Max distance: 50   Walk 10 feet activity   Assist     Assist level: Minimal Assistance - Patient > 75% Assistive device: Walker-rolling   Walk 50 feet activity   Assist    Assist level: Minimal Assistance - Patient > 75% Assistive device: Walker-rolling    Walk 150 feet activity   Assist Walk 150 feet activity did not occur: Safety/medical concerns         Walk 10 feet on uneven surface  activity   Assist Walk 10 feet on uneven surfaces activity did not occur: Safety/medical concerns         Wheelchair     Assist Is the patient using a wheelchair?: Yes (for transport  and energy conservation) Type of Wheelchair: Manual    Wheelchair assist level: Dependent - Patient 0%      Wheelchair 50 feet with 2 turns activity    Assist        Assist Level: Dependent - Patient 0%   Wheelchair 150 feet activity     Assist      Assist Level: Dependent - Patient 0%   Blood pressure 136/66, pulse 80, temperature 98.5 F (36.9 C), temperature source Oral, resp. rate 18, height 5\' 5"  (1.651 m), weight 61 kg, SpO2 100 %.   Medical Problem List and Plan:  1. Functional deficits secondary to metabolic encephalopathy due to cholecystitis             - patient may  shower             - ELOS/Goals: 7-10 days, Mod I PT/OT - Of note, workup did show MRI brain w/ punctate acute infarct-not clear if this is artifactual or a incidental finding; neurology deemed presentation more consistent with metabolic encephalopathy and gave no further recommendations.-2.  Antithrombotics: -DVT/anticoagulation:  Pharmaceutical: Heparin 5000U Q8H             -antiplatelet therapy: Plavix 75mg  QD   3. Pain Management: Tylenol and robaxin as needed   4. Mood/Behavior/Sleep: LCSW to evaluate and provide emotional support             -antipsychotic agents: n/a   -Melatonin and Trazodone PRN -05/30/23 didn't sleep well last night, doesn't want to schedule meds, advised of PRNs  5. Neuropsych/cognition: This patient is capable of making decisions on her own behalf.   6. Skin/Wound Care: Routine skin care checks; pressure relief measures             -routine drain care   7. Fluids/Electrolytes/Nutrition: Strict Is and Os and follow-up chemistries per nephrology             -renal/carb modified diet             -continue protein supplementation, Renvela TID             -1200 cc FR   8: DM-2, A1c = 7.7%             -CGBs QID and carb mod diet, SSI             -continue Semglee 15 units daily             -continue Novolog 3 units TID WC  -05/30/23 CBGs much better today, cont  to monitor trend  CBG (last 3)  Recent Labs    05/31/23 1632 05/31/23 2037 06/01/23 0532  GLUCAP 104* 108* 209*     elevated this am will increase semglee to 17U  9: Hyperlipidemia: continue Atorvastatin 80mg  QD   10: Hyponatremia: management per nephrology; no intervention or recheck for Na 124 5/30 rec'd on admission  -05/30/23 Na 124 on yesterday's labs, managed by nephro, HD yesterday   11: Acute on chronic diastolic CHF, pulmonary edema: EF 55-60%             -strict Is and Os and daily weight             -1200 cc FR             -continue Coreg 6.25mg  BID  -05/30/23 wt down today, monitor for trend Filed Weights   05/28/23 1400 05/30/23 0444 05/31/23 0500  Weight: 64 kg 61.2 kg 61 kg      12: Hypertension: continue carvedilol 6.25 mg BID  Controlled 6/4  Vitals:   05/29/23 1630 05/29/23 1711 05/29/23 1718 05/29/23 1938  BP: 90/65 123/68 119/66 116/66   05/30/23 0444 05/30/23 1313 05/30/23 1700 05/30/23 2003  BP: 125/76 126/77 117/67 128/65   05/31/23 0607 05/31/23 1347 05/31/23 1930 06/01/23 0600  BP: 121/84 133/72 (!) 142/72 136/66      13: Iron deficiency: continue Ferrlecit through 6/02-- completed   14: ESRD on HD via right IJ TDC on 5/20             -HD on T/T/S schedule             -has been accepted at [facility] in [facility] on TTS 11:40    -Appreciate nephrology assistance   15: Anemia: continue IV iron and follow-up CBC on routine labs   16: Cholelithiasis/cholecystitis s/p cholecystostomy tube -leukocytosis resolved, continue Augmentin through 6/01-- had one dose 05/28/23 and then d/c'd, per pharmacy and d/c summary, end date 5/31 -will need follow up in drain clinic and follow up with Dr. Sheliah Hatch after that to discuss whether she is a candidate for laparoscopic cholecystectomy - Daily flushes 5-10 cc    17: History of CAD: continue Plavix, statin, BB   18: Constipation possible contributing to nausea and lack of appetite              -continue Colace 100mg  BID and Miralax 17g BID; Dulcolax supp prn  -Continue Zofran 4mg  q6h PRN for nausea  -05/30/23 LBM last night, monitor for now    LOS: 4 days A FACE TO FACE EVALUATION WAS PERFORMED  Erick Colace 06/01/2023, 8:58 AM

## 2023-06-01 NOTE — Progress Notes (Signed)
**Note Samantha-Identified via Obfuscation** Occupational Therapy Session Note  Patient Details  Name: Samantha [name] MRN: [id number] Date of Birth: [birth year]  Today's Date: 06/01/2023 OT Individual Time: 0803-0900 OT Individual Time Calculation (min): 57 min  OT Individual Time: 1045-1130 OT Individual Time Calculation (min): 45 min   Short Term Goals: Week 1:  OT Short Term Goal 1 (Week 1): STG=LTG d.t ELOS  Skilled Therapeutic Interventions/Progress Updates:     AM Session: Pt received semi-reclined in bed presenting to be in good spirits receptive to skilled OT session reporting 3/10 pain in R side, Pt reporting she received medications prior to therapy session- OT offering intermittent rest breaks, repositioning, and therapeutic support to optimize participation in therapy session. Utilized electronic interpreter system at beginning of session to support optimal communication and participation. In-person interpreter, Samantha [name], present for remainder of session. Pt presenting to be extremely fatigued this session with increased time and rest breaks required throughout session.   Focused beginning of session on completing Pt's morning BADL routine to support increased independence in BADLs. Pt transitioned to EOB using bed features with supervision. Pt donned shirt sitting with supervision. Pt able to cross legs into figure-four position to weave feet and stand with CGA to bring pants to waist. Pt donned jacket, socks, and shoes with supervision.  Engaged Pt in standing grooming/hygiene tasks to work on activity tolerance and dynamic standing balance. Pt able to brush teeth in standing with rest break provided following. Pt then able to brush hair and wash face in standing with seated rest break following.   Pt politely refused to walk partial way to therapy gym d/t fatigue. Transported Pt total A to therapy gym in wc for time management and energy conservation. Stand step wc>EOM using RW CGA.   Engaged Pt in dynamic standing balance  activity with posterior reaching incorporated into task to simulate toileting and dressing tasks. Pt instructed to reach posteriorly to remove large, light resistance clothes pins from back portion of her shirt and place them on veridical bar placed anteriorly to Pt. Pt then doffed clothes pins from vertical bar and handed them to therapist with anterior reaching outside her base of support facilitated. Pt able to complete with supervision-CGA with seated rest break provided following. Graded up task for increased balance challenge with Pt completing same activity while standing on compliant surface with RW support with Pt able to complete CGA.   Pt reporting fatigue and requesting break in her wc for posterior trunk support. Pt completed stand step EOM>wc using RW CGA. Transported Pt total A back to room in wc. Pt was left resting in wc with call bell in reach, seat belt alarm on, and all needs met with Dtr present in room.  Session 2:  Pt received deeply sleeping in bed presenting to be fatigued, however receptive to skilled OT session reporting un-rated pain in abdomen- OT offering intermittent rest breaks, repositioning, and therapeutic support to optimize participation in therapy session. Interpreter present throughout session to optimize participation and communication.   Pt transitioned to EOB using bed features supervision. Pt ambulated ~50 ft to therapy gym using RW CGA at slowed pace with min verbal cues provided for head placement. Pt transported remainder of the way to therapy gym in wc for time management and energy conservation.   Stand step wc>EOM using RW CGA. Engaged Pt and DTR in d/c planning conversation. Discussed home set-up with plans for supervision with Pt's DTR reporting Pt will be d/c'ing home with the Pt's other DTR to a  single-level home with a tub/shower and walk-in shower options available. Education provided on DME options of TTB vs shower chair with plans to practice  transfers to determine safety method during upcoming therapy sessions.   Pt urgently requesting to use restroom. Completed functional mobility EOM>bathroom ~25 ft using RW CGA. Provided increased time on toilet d/t need for BM (continent void and BM documented in flowsheets). Pt able to perform anterior/posterior peri-care and clothing management CGA using RW with min verbal cueing provided for safety. Stood at sink to wash hands CGA and ambulated back to EOM using RW CGA.  Engaged Pt in seated UB therex to increase strength and endurance for BADLs. Pt able to complete 1x10 reps of bicep curls, chest press, overhead press, and anterior deltoid lift with prolonged rest breaks provided between sets and moderate encouragement provided to support participation.   Stand step EOM>wc using RW CGA. Pt was transported back to room total A in wc for energy conservation. Pt was left resting in wc with call bell in reach, seat belt alarm on, and all needs met.    Therapy Documentation Precautions:  Precautions Precautions: Fall, Other (comment) Precaution Comments: denies falls in past 6 months, drain on R side Restrictions Weight Bearing Restrictions: No  Therapy/Group: Individual Therapy  Army Fossa 06/01/2023, 7:50 AM

## 2023-06-01 NOTE — IPOC Note (Addendum)
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)     Functional Problem List: Nursing Bowel, Medication Management, Endurance, Pain, Safety, Skin Integrity  PT Balance, Perception, Safety, Behavior, Sensory, Skin Integrity, Endurance, Motor, Nutrition, Pain  OT Balance, Cognition, Endurance, Motor, Safety, Behavior (self limiting)  SLP    TR         Basic ADL's: OT Grooming, Bathing, Dressing, Toileting     Advanced  ADL's: OT       Transfers: PT Bed Mobility, Bed to Chair, Car, Furniture, Civil Service fast streamer, Research scientist (life sciences): PT Ambulation, Stairs     Additional Impairments: OT    SLP        TR      Anticipated Outcomes Item Anticipated Outcome  Self Feeding no goal  Swallowing      Basic self-care  MOD I  Toileting  MOD I   Bathroom Transfers MOD I toilet, S shower  Bowel/Bladder  manage bowel w mod I assist  Transfers  mod-I using LRAD  Locomotion  supervision using LRAD  Communication     Cognition     Pain  < 4 with prns  Safety/Judgment  manage w cues   Therapy Plan: PT Intensity: Minimum of 1-2 x/day ,45 to 90 minutes PT Frequency: 5 out of 7 days PT Duration Estimated Length of Stay: 7-10 days OT Intensity: Minimum of 1-2 x/day, 45 to 90 minutes OT Frequency: 5 out of 7 days OT Duration/Estimated Length of Stay: 7-10     Team Interventions: Nursing Interventions Patient/Family Education, Bowel Management, Disease Management/Prevention, Pain Management, Medication Management, Skin Care/Wound Management, Discharge Planning  PT interventions Ambulation/gait training, Community reintegration, DME/adaptive equipment instruction, Neuromuscular  re-education, Psychosocial support, Stair training, UE/LE Strength taining/ROM, Warden/ranger, Discharge planning, Functional electrical stimulation, Pain management, Skin care/wound management, Therapeutic Activities, UE/LE Coordination activities, Cognitive remediation/compensation, Disease management/prevention, Functional mobility training, Patient/family education, Splinting/orthotics, Therapeutic Exercise, Visual/perceptual remediation/compensation  OT Interventions Balance/vestibular training, Discharge planning, Pain management, Self Care/advanced ADL retraining, Therapeutic Activities, UE/LE Coordination activities, Therapeutic Exercise, Skin care/wound managment, Visual/perceptual remediation/compensation, Patient/family education, Functional mobility training, Disease mangement/prevention, Cognitive remediation/compensation, DME/adaptive equipment instruction, Neuromuscular re-education, Psychosocial support, UE/LE Strength taining/ROM  SLP Interventions    TR Interventions    SW/CM Interventions Discharge Planning, Psychosocial Support, Patient/Family Education, Disease Management/Prevention   Barriers to Discharge MD  Medical stability  Nursing Home environment access/layout, Decreased caregiver support 1 level 3 ste left rail w daughter;Daughter works but will leave her job if her mother needs 24/7 care. Can provide min A  PT Decreased caregiver support, Behavior    OT Incontinence    SLP      SW Insurance for SNF coverage, Decreased caregiver support, Lack of/limited family support Daughter works and has expressed leaving job if patient requires 24/7 supervision.   Team Discharge Planning: Destination: PT-Home ,OT- Home , SLP-  Projected Follow-up: PT-Outpatient PT, 24 hour supervision/assistance, OT-  Home health OT, SLP-  Projected Equipment Needs: PT-To be determined, OT-to be determined  , SLP-  Equipment Details: PT- , OT-  Patient/family involved in  discharge planning: PT- Patient, Family member/caregiver,  OT-Patient, SLP-   MD ELOS: 7-10d Medical Rehab Prognosis:  Good Assessment: The patient has been admitted for CIR therapies with  the diagnosis of acute metabolic encephalopathy . The team will be addressing functional mobility, strength, stamina, balance, safety, adaptive techniques and equipment, self-care, bowel and bladder mgt, patient and caregiver education, diabetic management , renal management . Goals have been set at mod I . Anticipated discharge destination is Home  .        See Team Conference Notes for weekly updates to the plan of care

## 2023-06-01 NOTE — Progress Notes (Signed)
[facility] NEPHROLOGY PROGRESS NOTE  Assessment/ Plan: Pt is a 72 y.o. yo female  with past medical history significant for hypertension, type II DM, PAD, CVA, HLD, CKD 3B who presented with shortness of breath and fluid overload.   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  OP HD was arranged at [facility] in [facility] on TTS 11:40 chair time. Plan for PD later on it seems but will need to wait until cholecystitis is better. If not going to pursue PD will need AV access down the line.  Next HD today.   # Acute hypoxic respiratory failure due to fluid overload: Received thoracocentesis and diuresis.  Resolved. Now managing volume with HD.   # HTN/volume: BP acceptable.  No edema.  UF as tolerated.  Only on low dose coreg.  I think we have achieved EDW    # Anemia: Hemoglobin at goal-  no treatment needed as of yet but getting close-  iron low, repleted.   # Acute cholecystitis: HIDA scan was positive therefore on antibiotics.  Seen by general surgery and IR.  S/p cholecystostomy tube placement.   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  # Hyponatremia: Now managing with with dialysis.  Recommend fluid restriction.  Subjective: Seen and examined.  Working with therapy.  Denies nausea, vomiting, chest pain, shortness of breath.  Objective Vital signs in last 24 hours: Vitals:   05/31/23 0607 05/31/23 1347 05/31/23 1930 06/01/23 0600  BP: 121/84 133/72 (!) 142/72 136/66  Pulse: 76 81 79 80  Resp: 15 17 18 18   Temp: 98.2 F (36.8 C) 98.8 F (37.1 C) 98.4 F (36.9 C) 98.5 F (36.9 C)  TempSrc:  Oral Oral Oral  SpO2: 99% 100% 100% 100%  Weight:      Height:       Weight change:   Intake/Output Summary (Last 24 hours) at 06/01/2023 1045 Last data filed at 06/01/2023  0836 Gross per 24 hour  Intake 413 ml  Output 325 ml  Net 88 ml        Labs: RENAL PANEL Recent Labs  Lab 05/27/23 0443 05/28/23 1437 05/29/23 1321  NA 124* 125* 124*  K 4.1 3.5 3.6  CL 84* 85* 86*  CO2 23 25 22   GLUCOSE 104* 106* 170*  BUN 58* 33* 39*  CREATININE 6.48* 4.71* 5.26*  CALCIUM 8.8* 8.4* 8.8*  PHOS 6.2* 3.8 2.8  ALBUMIN 2.3* 2.4* 2.9*     Liver Function Tests: Recent Labs  Lab 05/27/23 0443 05/28/23 1437 05/29/23 1321  ALBUMIN 2.3* 2.4* 2.9*    No results for input(s): "LIPASE", "AMYLASE" in the last 168 hours. No results for input(s): "AMMONIA" in the last 168 hours. CBC: Recent Labs    05/23/23 1335 05/25/23 0218 05/27/23 0443 05/28/23 1437 05/29/23 1321  HGB 10.8* 10.0* 10.4* 10.2* 11.5*  MCV 91.6 89.6 91.1 91.9 90.9  FERRITIN  --  632*  --   --   --   TIBC  --  172*  --   --   --   IRON  --  13*  --   --   --      Cardiac Enzymes: No results for input(s): "CKTOTAL", "CKMB", "CKMBINDEX", "TROPONINI" in the last 168 hours. CBG: Recent Labs  Lab 05/31/23 1150 05/31/23 1632 05/31/23  2037 06/01/23 0532 06/01/23 0947  GLUCAP 178* 104* 108* 209* 139*     Iron Studies: No results for input(s): "IRON", "TIBC", "TRANSFERRIN", "FERRITIN" in the last 72 hours. Studies/Results: No results found.  Medications: Infusions:  sodium chloride      Scheduled Medications:  atorvastatin  80 mg Oral Daily   carvedilol  6.25 mg Oral BID WC   Chlorhexidine Gluconate Cloth  6 each Topical Q0600   Chlorhexidine Gluconate Cloth  6 each Topical Q12H   clopidogrel  75 mg Oral Daily   docusate sodium  100 mg Oral BID   feeding supplement (NEPRO CARB STEADY)  237 mL Oral BID BM   guaiFENesin  600 mg Oral BID   heparin injection (subcutaneous)  5,000 Units Subcutaneous Q8H   insulin aspart  0-15 Units Subcutaneous TID WC   insulin aspart  0-5 Units Subcutaneous QHS   insulin aspart  3 Units Subcutaneous TID WC   insulin glargine-yfgn  15  Units Subcutaneous Daily   polyethylene glycol  17 g Oral BID   sevelamer carbonate  800 mg Oral TID WC    have reviewed scheduled and prn medications.  Physical Exam: General:NAD, comfortable Heart:RRR, s1s2 nl Lungs:clear b/l, no crackle Abdomen:soft, cholecystostomy drain in place. Extremities:No edema Dialysis Access: TDC.  Antoni Stefan Prasad Jaunice Mirza 06/01/2023,10:45 AM  LOS: 4 days

## 2023-06-01 NOTE — Progress Notes (Signed)
   06/01/23 1800  Vitals  Temp 97.6 F (36.4 C)  Temp Source Oral  During Treatment Monitoring  Intra-Hemodialysis Comments Tx completed  Post Treatment  Dialyzer Clearance Lightly streaked  Duration of HD Treatment -hour(s) 3.5 hour(s)  Hemodialysis Intake (mL) 0 mL  Liters Processed 74  Fluid Removed (mL) 1700 mL  Tolerated HD Treatment Yes  Post-Hemodialysis Comments Pt UF unable to 2 l as ordered d/t hypotension. during tx.  Hemodialysis Catheter Right Internal jugular Double lumen Permanent (Tunneled)  Placement Date/Time: 05/17/23 1541   Serial / Lot #: [id number]  Expiration Date: 10/28/27  Time Out: Correct patient;Correct site;Correct procedure  Maximum sterile barrier precautions: Hand hygiene;Cap;Mask;Sterile gown;Sterile gloves;Large sterile s...  Site Condition No complications  Blue Lumen Status Heparin locked  Red Lumen Status Heparin locked  Purple Lumen Status N/A  Catheter fill solution Heparin 1000 units/ml  Catheter fill volume (Arterial) 1.3 cc  Catheter fill volume (Venous) 1.3  Dressing Type Transparent  Dressing Status Antimicrobial disc in place  Interventions Dressing changed  Drainage Description None  Dressing Change Due 06/03/23  Post treatment catheter status Capped and Clamped

## 2023-06-02 DIAGNOSIS — G9341 Metabolic encephalopathy: Secondary | ICD-10-CM | POA: Diagnosis not present

## 2023-06-02 LAB — RENAL FUNCTION PANEL
Albumin: 2.9 g/dL — ABNORMAL LOW (ref 3.5–5.0)
Anion gap: 15 (ref 5–15)
BUN: 22 mg/dL (ref 8–23)
CO2: 22 mmol/L (ref 22–32)
Calcium: 9 mg/dL (ref 8.9–10.3)
Chloride: 89 mmol/L — ABNORMAL LOW (ref 98–111)
Creatinine, Ser: 4.03 mg/dL — ABNORMAL HIGH (ref 0.44–1.00)
GFR, Estimated: 11 mL/min — ABNORMAL LOW (ref >60)
Glucose, Bld: 203 mg/dL — ABNORMAL HIGH (ref 70–99)
Phosphorus: 3.1 mg/dL (ref 2.5–4.6)
Potassium: 3.6 mmol/L (ref 3.5–5.1)
Sodium: 126 mmol/L — ABNORMAL LOW (ref 135–145)

## 2023-06-02 LAB — GLUCOSE, CAPILLARY
Glucose-Capillary: 147 mg/dL — ABNORMAL HIGH (ref 70–99)
Glucose-Capillary: 170 mg/dL — ABNORMAL HIGH (ref 70–99)
Glucose-Capillary: 195 mg/dL — ABNORMAL HIGH (ref 70–99)
Glucose-Capillary: 166 mg/dL — ABNORMAL HIGH (ref 70–99)

## 2023-06-02 MED ORDER — CHLORHEXIDINE GLUCONATE CLOTH 2 % EX PADS: 6.0000 | MEDICATED_PAD | Freq: Every day | CUTANEOUS | Status: DC

## 2023-06-02 NOTE — Discharge Instructions (Addendum)
Inpatient Rehab Discharge Instructions  Samantha [name] Discharge date and time: 06/07/2023   Activities/Precautions/ Functional Status: Activity: no lifting, driving, or strenuous exercise until cleared by MD Diet: renal diet; 1200 mL of fluid restriction every 24 hours Wound Care: routine catheter care Functional status:  ___ No restrictions     ___ Walk up steps independently ___ 24/7 supervision/assistance   ___ Walk up steps with assistance _x__ Intermittent supervision/assistance  ___ Bathe/dress independently ___ Walk with walker     ___ Bathe/dress with assistance ___ Walk Independently    ___ Shower independently ___ Walk with assistance    ___x Shower with assistance _x__ No alcohol     ___ Return to work/school ________   Special Instructions: No driving, alcohol consumption or tobacco use.  Recommend checking fingerstick blood sugars four (4) times daily and record. Bring this information with you to follow-up appointment with PCP.  Recommend daily BP measurement in same arm and record time of day. Bring this information with you to follow-up appointment with PCP.   COMMUNITY REFERRALS UPON DISCHARGE:     Outpatient: PT     OT    ST             Agency: [facility]  [phone]              Appointment Date/Time: TBD  Medical Equipment/Items Ordered: Wheelchair                                                 Agency/Supplier: [employer] [phone]     My questions have been answered and I understand these instructions. I will adhere to these goals and the provided educational materials after my discharge from the hospital.  Patient/Caregiver Signature _______________________________ Date __________  Clinician Signature _______________________________________ Date __________  Please bring this form and your medication list with you to all your follow-up doctor's appointments.

## 2023-06-02 NOTE — Progress Notes (Signed)
Patient ID: Samantha [name], female   DOB: [birth year], 72 y.o.   MRN: [id number]  Per, Reuel Boom  Pt can start at OP HD clinic on Tuesday, 6/11.  Clinic social worker at [facility] has contacted [employer] transportation to arrange pt's transportation to/from HD. Transportation will start on 6/11 and pt will need to be ready for pick-up by 10:45 am.

## 2023-06-02 NOTE — Discharge Summary (Signed)
Physician Discharge Summary  Patient ID: Samantha [name] MRN: [id number] DOB/AGE: 1951-04-05 72 y.o.  Admit date: 05/28/2023 Discharge date: 06/07/2023  Discharge Diagnoses:  Principal Problem:   Acute metabolic encephalopathy Active Problems: Functional deficits secondary to acute metabolic encephalopathy End-stage renal disease Diabetes mellitus type 2 Hyperlipidemia Hyponatremia Acute on chronic diastolic congestive heart failure Iron deficiency Hypertension Anemia Cholelithiasis/cholecystitis Constipation  Discharged Condition: stable  Significant Diagnostic Studies: none  Labs:  Basic Metabolic Panel: Recent Labs  Lab 06/01/23 1410 06/02/23 1030 06/03/23 1455 06/05/23 0857  NA 122* 126* 126* 127*  K 3.1* 3.6 3.7 3.4*  CL 85* 89* 88* 81*  CO2 23 22 23 26   GLUCOSE 209* 203* 189* 228*  BUN 41* 22 36* 29*  CREATININE 5.32* 4.03* 5.81* 5.72*  CALCIUM 8.6* 9.0 9.2 9.5  PHOS 3.1 3.1 3.9 4.2    CBC: Recent Labs  Lab 06/01/23 1410 06/03/23 1445 06/05/23 0856  WBC 7.2 8.2 9.1  HGB 9.8* 10.6* 10.8*  HCT 28.4* 30.6* 31.3*  MCV 90.4 90.3 90.7  PLT 325 359 350    CBG: Recent Labs  Lab 06/06/23 0549 06/06/23 1118 06/06/23 1637 06/06/23 2033 06/07/23 0539  GLUCAP 158* 143* 153* 84 134*    Brief HPI:   Samantha [name] is a 72 y.o. female who presented to the ED at [facility] complaining of pain and SOB. CT chest abdomen/pelvis was done which showed pulmonary edema versus bibasilar infiltrates. Antibiotics started then discontinued due to low suspicion for pneumonia in setting due to BNP was elevated to 600, bilateral pulmonary effusions. Initially patient received IV fluid bolus LR 1 L. Oxygen requirement went up to 4.5 L/min. last echo from 2022 showed EF 65% with grade 1 diastolic dysfunction. Echocardiogram updated. RUQ ultrasound positive for gallstones. Nephrology consulted on 5/08. Increased WOB on 5/09 and required BiPAP.  She was not  diuresing well despite high dose of Lasix per nephrology and was transferred to [facility] due to acute respiratory failure, acute on chronic diastolic heart failure. PCCM consulted on 5/11.  Dialysis catheter placed on 5/11 as well as bilateral thoracenteses. Started intermittent HD on 5/15. Her blood and pleural fluid cultures were negative. Due to ongoing need for HD a right IJ TDC was placed on 5/20. She had ongoing abdominal discomfort despite disimpaction and treatment of constipation. HIDA scan performed on 5/24 and general surgery consulted due to non-visualization. Plavix was held and she under percutaneous cholecystostomy tube placement on 5/26. Rapid response called on 5/25 due to increased lethargy after HD only responding to pain. CT, Mri of brain obtained and neurology consulted. CT-head-aspects 10.  No bleed.  Chronic basal ganglia and cerebellar infarcts. EEG obtained. MRI read as acute cerebellar infarct (not seen on review by neurologist), symptoms attributed to acute metabolic encephalopathy. Normal SaO2 on room air. BP stable. Poor PO intake.     Hospital Course: Katena April was admitted to rehab 05/28/2023 for inpatient therapies to consist of PT, ST and OT at least three hours five days a week. Past admission physiatrist, therapy team and rehab RN have worked together to provide customized collaborative inpatient rehab. Follow-up chemistries managed by nephrology team. Completed Ferrlecit.  Finished course of oral antibiotics. H and H stable. 6/07: RD reporting abdominal discomfort, nausea with vomiting. I spoke with her via interpretive services and she is complaining of abdominal bloating, gas, and constipation. She vomited once several evenings ago. I explained this is most likely due to her chronic cholecystitis and will be addressed  by surgery at outpatient follow-up. She remains afebrile with normal WBC. Abdomen is minimally tender to palpation, soft, without guarding.  Will provide  simethicone prn and continue anti-constipation relief.    Blood pressures were monitored on TID basis and carvedilol 6.25 mg continued and remained stable.  Diabetes has been monitored with ac/hs CBG checks and SSI was use prn for tighter BS control. Semglee 15 units daily and Novolog 3 units q AC continued. Discontinue short acting insulin at discharge and continue Semglee. Informed not to resume 70/30 or oral meds.  Rehab course: During patient's stay in rehab weekly team conferences were held to monitor patient's progress, set goals and discuss barriers to discharge. At admission, patient required min with mobility and with basic self-care skills  She has had improvement in activity tolerance, balance, postural control as well as ability to compensate for deficits. She has had improvement in functional use RUE/LUE  and RLE/LLE as well as improvement in awareness. Patient has met 8 of 8 long term goals due to improved activity tolerance, improved balance, postural control, ability to compensate for deficits, improved awareness, and improved coordination.  Patient to discharge at overall Supervision level.  Outpatient PT, OT, ST at [facility].  Discharge disposition: 01-Home or Self Care     Diet: Renal; 1200 mL fluid retriction  Special Instructions: No driving, alcohol consumption or tobacco use.  Pt can start at OP HD clinic on Tuesday, 6/11.  Clinic social worker at [facility] has contacted [employer] transportation to arrange pt's transportation to/from HD. Transportation will start on 6/11 and pt will need to be ready for pick-up by 10:45 am.    Discharge Instructions     Discharge patient   Complete by: As directed    Discharge disposition: 01-Home or Self Care   Discharge patient date: 06/07/2023      Allergies as of 06/07/2023       Reactions   Chicken Allergy Other (See Comments)   headache   Perflutren Other (See Comments)   Pt with back pain as soon as  administered.- patient refuted this in 2024        Medication List     STOP taking these medications    amoxicillin-clavulanate 500-125 MG tablet Commonly known as: AUGMENTIN   glucose blood test strip   insulin aspart 100 UNIT/ML injection Commonly known as: novoLOG   insulin glargine-yfgn 100 UNIT/ML injection Commonly known as: SEMGLEE   NovoLOG FlexPen 100 UNIT/ML FlexPen Generic drug: insulin aspart       TAKE these medications    acetaminophen 325 MG tablet Commonly known as: TYLENOL Take 1-2 tablets (325-650 mg total) by mouth every 4 (four) hours as needed for mild pain.   atorvastatin 80 MG tablet Commonly known as: LIPITOR Tome 1 tableta (80 mg en total) por va oral diariamente. (Take 1 tablet (80 mg total) by mouth daily.)   carvedilol 6.25 MG tablet Commonly known as: COREG Take 1 tablet (6.25 mg total) by mouth 2 (two) times daily with a meal.   clopidogrel 75 MG tablet Commonly known as: PLAVIX Take 1 tablet (75 mg total) by mouth daily.   feeding supplement (NEPRO CARB STEADY) Liqd Take 237 mLs by mouth 2 (two) times daily between meals.   insulin detemir 100 UNIT/ML FlexPen Commonly known as: LEVEMIR Inject 15 Units into the skin at bedtime.   ondansetron 4 MG tablet Commonly known as: ZOFRAN Take 1 tablet (4 mg total) by mouth every  6 (six) hours as needed for nausea.   sevelamer carbonate 800 MG tablet Commonly known as: RENVELA Take 1 tablet (800 mg total) by mouth 3 (three) times daily with meals. What changed: how much to take        Follow-up Information     [facility]. Go on 06/08/2023.   Why: Schedule is Tuesday,Thursday, Saturday with 11:40 chair time. On Tuesday (6/11), please arrive at 11:00 to complete paperwork prior to treatment.  Clinic info: [address], [place] [address] [phone]        Hoy Register, MD Follow up.   Specialty: Family Medicine Why: Call the office in 1-2 days to  make arrangements for hospital follow-up appointment. Contact information: [address] [place] [address] [place] [address] [phone]         Erick Colace, MD Follow up.   Specialty: Physical Medicine and Rehabilitation Why: As needed Contact information: [address] [place] [address] [phone]         Kinsinger, De Blanch, MD Follow up.   Specialty: General Surgery Why: Call the office in 1-2 days to make arrangements for hospital follow-up appointment. (to discuss gallbladder surgery) Contact information: [address] [place] [address] [phone]                 Signed: Milinda Antis 06/07/2023, 1:11 PM

## 2023-06-02 NOTE — Patient Care Conference (Signed)
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 Claudette Laws Social Worker Present: Lavera Guise, BSW Nurse Present: Chana Bode, RN PT Present: Casimiro Needle, PT OT Present: Bonnell Public, OT SLP Present: Feliberto Gottron, SLP PPS Coordinator present : Edson Snowball, PT     Current Status/Progress Goal Weekly Team Focus  Bowel/Bladder      Continent of bowel with constipation addressed; LBM 06/01/23; oliguria with HD    Managing constipation with oral agents    Assess effectiveness of scheduled and need for additional prn medication   Swallow/Nutrition/ Hydration               ADL's   UB dressing/bathing supervision, LB dressing/bathing CGA   Mod I-superivsion   dynamic standing balance, energy conservation, family education to provide insight into deficits, BADL re-training, Pt is self-limiting and benefits from increased rest breaks and motivation    Mobility   signficant endurance impairments with possible self-limiting behaviors, supervision/mod-I bed mobility, CGA sit<>stand and stand pivot transfers without AD, light min assist gait up to ~121ft using R HHA, CGA/light min assist 4 stair navigation using HRs   supervision overall at ambulatory  level  activity tolerance, pt education on importance of participation in therapy, family observation of therapy sessions to start education, B LE strengthening, transfers, gait training, stair navigation training for home entry    Communication                Safety/Cognition/ Behavioral Observations               Pain      N/a          Skin      Cholecystitis; drain in place connected to drainage bag   Patient and family able to manage flushing and care for drain    Practice flushing tube and emptying drainage bag; return demonstration by family    Discharge Planning:  Goal to d/c home MOD I, dtrs currently work. If patient requires 24/7 supervision, daughter fabiola plans to assist   Team Discussion: Patient making progress; limited by activity intolerance and self limiting behaviors.  Patient on target to meet rehab goals: yes, currently needs supervision for uppoer body bathing and dressing. Needs CGA for lower body care and toileting.  Able to ambulate up to 100' with a rolling walker with supervision  - CGA. Goals for discharge set for mod I - supervision overall.  *See Care Plan and progress notes for long and short-term goals.   Revisions to Treatment Plan:  Energy conservation tips   Teaching Needs: Safety, medications, dietary modifications, transfers, etc.   Current Barriers to Discharge: Decreased caregiver support  Possible Resolutions to Barriers: Family education OP follow up services     Medical Summary Current Status:  ESRD with need for ongoing HD, hypo Na+, Hypo K+, chole drain functioning well     Possible Resolutions to Barriers/Weekly Focus: Nephro on consult   Continued Need for Acute Rehabilitation Level of Care: The patient requires daily medical management by a physician with specialized training in physical medicine and rehabilitation for the following reasons: Direction of a multidisciplinary physical rehabilitation program to  maximize functional independence : Yes Medical management of patient stability for increased activity during participation in an intensive rehabilitation regime.: Yes Analysis of laboratory values and/or radiology reports with any subsequent need for medication adjustment and/or medical intervention. : Yes   I attest that I was present, lead the team conference, and concur with the assessment and plan of the team.   Chana Bode B 06/02/2023, 2:13 PM

## 2023-06-02 NOTE — Progress Notes (Signed)
Occupational Therapy Session Note  Patient Details  Name: Samantha [name] MRN: [id number] Date of Birth: 1951-09-18  Today's Date: 06/02/2023  1st Session OT Individual Time: 0904-1000 OT Individual Time Calculation (min): 56 min   2nd Session:  OT Individual Time: 4098-1191 OT Individual Time Calculation (min): 42 min   Short Term Goals: Week 1:  OT Short Term Goal 1 (Week 1): STG=LTG d.t ELOS  Skilled Therapeutic Interventions/Progress Updates:     AM Session:  Pt received in therapy gym handed off from PT. Pt presenting to be in good spirits receptive to skilled OT session reporting 0/10 pain- OT offering intermittent rest breaks, repositioning, and therapeutic support to optimize participation in therapy session. Pt reporting she is feeling fatigued following dialysis yesterday, however willing to participate in therapy session. In-person interpreter present throughout session today to support participation and communication.   Attempted to engage Pt in dynamic standing balance activity with functional reaching incorporated into task with .75# wrist weights donned on Pt's BUE. Pt able to recall correct hand placement for sit>stand at beginning of tasks. Upon standing and attempting to place squigz on window, Pt stating "I cannot do it". Provided maximal therapeutic support, however Pt refusing to continue tasks. Provided prolonged seated rest break and encouragement to support moral. Graded down task with removal of wrist weights and allowed Pt to complete in seated position to facilitate dynamic sitting balance with functional reaching with Pt receptive when given maximal support. Pt able to complete without LOB and tolerate ~2 minutes of functional reaching superiorly and inferiorly with rest break provided following.  Pt inquiring about d/c date with education provided on upcoming team conference and plans to set d/c date later this AM. Engaged Pt in conversation on d/c planning  with plans reported for her DTR's to provide supervision and assistance at d/c.   Pt reporting fatigue and unmotivated to participate in remainder of therapy session. However, engaged Pt in light hearted conversation regarding Pt's interests with improvement in motivation and moral noted with Pt increasingly willing to participate with maximal motivation and encouragement provided.    Engaged Pt in seated beach ball volley activity to work on dynamic sitting balance and activity tolerance. Pt able to hit beach ball back and forth to therapist ~2 minutes x3 trials while seated in wc. Prolonged rest breaks provided between sets.   Pt receptive to walking partial way to room using RW. Pt able to ambulate ~28ft with CGA with fatigue reported following. Transported Pt remainder of the way back to room total A in wc for energy conservation.   Stand step wc>EOB using RW CGA. EOB>supine supervision. Pt's DTR arriving at end of session. Pt was left resting in bed with call bell in reach, bed alarm on, DTR present in room, and all needs met.    PM Session:  Pt received reclined in bed with nursing staff present in room finishing providing care upon OT arrival. Pt presenting to be in good spirits receptive to skilled OT session reporting 0/10 pain- OT offering intermittent rest breaks, repositioning, and therapeutic support to optimize participation in therapy session. In-person interpreter present throughout session today to support participation and communication. Pt reporting she was able to eat some lunch today, however she is feeling the worse now that she has felt all day, however receptive to participating in therapy session with increased encouragement provided. Pt's DTR present throughout session for family education focus. Pt transitioned to EOB and donned her socks and shoes supervision. Stand  step EOB>wc CGA using RW. Transported Pt total A to ADL apartment for time management and energy conservation.  Discussed bathroom safety with education provided on environmental barriers and fall prevention with Pt and Pt's DTR receptive. Provided education on TTB transfer with demonstration and opportunity for practice provided. Education provided on skin breakdown prevention and shower curtain management. Pt able to complete transfer x1 trial CGA with OT assist. Pt then completed transfer with DTR assisting with transfer- DTR providing appropriate verbal/tactile cues and demonstrated appropriate body mechanics and intact safety awareness. Pt and DTR reporting confidence in completing transfers and bathing tasks at d/c. Pt's DTR reported pans to instal grab bars and HHSH to increase Pt safety and independence. Pt ambulated ~50 ft back to room for endurance and functional mobility training and transported remainder of the way in wc for energy conservation. Pt was handed off to PT entering room with all needs met.    Therapy Documentation Precautions:  Precautions Precautions: Fall, Other (comment) Precaution Comments: denies falls in past 6 months, drain on R side Restrictions Weight Bearing Restrictions: No  Therapy/Group: Individual Therapy  Army Fossa 06/02/2023, 7:48 AM

## 2023-06-02 NOTE — Progress Notes (Signed)
Physical Therapy Session Note  Patient Details  Name: Ziham Keeble MRN: [id number] Date of Birth: [birth year]  Today's Date: 06/02/2023 PT Individual Time: 1345-1415 PT Individual Time Calculation (min): 30 min   Short Term Goals: Week 1:  PT Short Term Goal 1 (Week 1): = to LTGs based on ELOS  Skilled Therapeutic Interventions/Progress Updates:     Pt received seated in [facility] and agrees to therapy. Reports pain in legs during ambulation, burning in nature. PT provides rest breaks as needed to manage pain. WC transport to gym. Pt ambulates x100' with RW and CGA, with cues for encouragement as pt attempts to stop ambulating around 52' into bout. Pt takes extended seated rest break. PT provides education on importance of progressing with ambulation to build endurance and provide CV recovery. Pt then ambulates x130' with R HHA with light minA for postural stability and cues for pursed lip breathing to optimize oxygen sats. Pt takes seated rest break then ambulates final bout of x130' with same assistance and cues. WC transport to room. Left seated with all needs within reach.   Therapy Documentation Precautions:  Precautions Precautions: Fall, Other (comment) Precaution Comments: denies falls in past 6 months, drain on R side Restrictions Weight Bearing Restrictions: No    Therapy/Group: Individual Therapy  Beau Fanny, PT, DPT 06/02/2023, 5:00 PM

## 2023-06-02 NOTE — Progress Notes (Signed)
Physical Therapy Session Note  Patient Details  Name: Samantha [name] MRN: [id number] Date of Birth: April 26, 1951  Today's Date: 06/02/2023 PT Individual Time: 0807-0906 PT Individual Time Calculation (min): 59 min   Short Term Goals: Week 1:  PT Short Term Goal 1 (Week 1): = to LTGs based on ELOS  Skilled Therapeutic Interventions/Progress Updates:    Pt received long sitting in bed eating an orange. [employer] Online Language Interpreter Maxine Glenn Chamizal [phone]) used until in-person interpreter De Nurse present throughout remainder of session. Pt reports she is still really tired and that she is still recovering from dialysis; however, pt agreeable to therapy session starting with ADLs. Transitioned to sitting EOB mod-I. Sitting EOB, pt able to perform dressing tasks (doff gown, don shirt, pants, socks, and shoes) with only set-up assist. Sit>stand EOB>RW with CGA and pulled pants up over hips without assist. L stand pivot to w/c using RW with CGA. Sitting in w/c at sink brushed hair without assist.    Transported to/from gym in w/c for time management and energy conservation.  Sit>stand w/c>RW with CGA progressing to supervision for safety during session. Gait training 29ft using RW with CGA for safety/steadying. Pt demos significantly decreased gait speed and pauses 2x for standing rest break during due to pt reporting significant fatigue and having to turn back and walk to w/c rather than continue on the longer walk that was planned.  Pt reports she still feels really sick; however, acknowledges that despite everything she has been through she is doing really well.  Participated in Timed Up and Go (TUG) using RW with CGA for safety: 1st trial: 30.75 seconds 2nd trial: 25 seconds  Patient demonstrates high fall risk as indicated by requiring >13.5seconds to complete the TUG.   Pt continues to benefit from therapeutic rest breaks due to impaired endurance.  Participated in 1 step navigation  training using RW to prepare for home entry. Therapist demonstrated proper sequencing of AD management. Pt stepped on/off 6" step x2 reps using RW with CGA for steadying and pt demonstrating excellent understanding of proper sequencing.  MD in/out for morning assessment.  Pt left seated in w/c in care of OT.   Therapy Documentation Precautions:  Precautions Precautions: Fall, Other (comment) Precaution Comments: denies falls in past 6 months, drain on R side Restrictions Weight Bearing Restrictions: No   Pain:  Reports pain at R drain site, but declines receiving medication for pain management during session. Continues to report some visual disturbance in R eye and R eye discomfort.    Therapy/Group: Individual Therapy  Ginny Forth , PT, DPT, NCS, CSRS 06/02/2023, 7:48 AM

## 2023-06-02 NOTE — Progress Notes (Addendum)
PROGRESS NOTE  Live Spanish language interpreter, pt seen in PT Subjective/Complaints:  No issues overnite   ROS: as per HPI. Denies CP, SOB, abd pain, N/V/D/C, or any other complaints at this time.    Objective:   No results found. Recent Labs    06/01/23 1410  WBC 7.2  HGB 9.8*  HCT 28.4*  PLT 325    Recent Labs    06/01/23 1410  NA 122*  K 3.1*  CL 85*  CO2 23  GLUCOSE 209*  BUN 41*  CREATININE 5.32*  CALCIUM 8.6*     Intake/Output Summary (Last 24 hours) at 06/02/2023 0849 Last data filed at 06/02/2023 0558 Gross per 24 hour  Intake 118 ml  Output 1975 ml  Net -1857 ml         Physical Exam: Vital Signs Blood pressure 109/61, pulse 84, temperature (!) 97.5 F (36.4 C), temperature source Oral, resp. rate 18, height 5\' 5"  (1.651 m), weight 59.8 kg, SpO2 100 %.   General: No acute distress Mood and affect are appropriate Heart: Regular rate and rhythm no rubs murmurs or extra sounds Lungs: Clear to auscultation, breathing unlabored, no rales or wheezes Abdomen: Positive bowel sounds, RUQ mildly tender to palpation, nondistended Extremities: No clubbing, cyanosis, or edema Skin: No evidence of breakdown, no evidence of rash, chole tube site CDI no erythema or induration , mild tenderness , sutures intact  Neurologic: Cranial nerves II through XII intact, motor strength is 5/5 in bilateral deltoid, bicep, tricep, grip, hip flexor, knee extensors, ankle dorsiflexor and plantar flexor  Cerebellar exam normal finger to nose to finger as well as heel to shin in bilateral upper and lower extremities Musculoskeletal: Full range of motion in all 4 extremities. No joint swelling    Lines-  + RUQ drain with green tinged clear fluid output; sutured in; nontender, no erythema.   + Right  IJ TDC dressing dry and intact without tenderness    Assessment/Plan: 1. Functional deficits which require 3+ hours per  day of interdisciplinary therapy in a comprehensive inpatient rehab setting. Physiatrist is providing close team supervision and 24 hour management of active medical problems listed below. Physiatrist and rehab team continue to assess barriers to discharge/monitor patient progress toward functional and medical goals  Care Tool:  Bathing    Body parts bathed by patient: Right arm, Left arm, Chest, Abdomen, Front perineal area, Buttocks, Right upper leg, Left upper leg, Right lower leg, Left lower leg, Face         Bathing assist Assist Level: Minimal Assistance - Patient > 75%     Upper Body Dressing/Undressing Upper body dressing   What is the patient wearing?: Pull over shirt    Upper body assist Assist Level: Contact Guard/Touching assist    Lower Body Dressing/Undressing Lower body dressing      What is the patient wearing?: Pants     Lower body assist Assist for lower body dressing: Contact Guard/Touching assist     Toileting Toileting    Toileting assist Assist for toileting: Contact Guard/Touching assist     Transfers Chair/bed transfer  Transfers assist     Chair/bed transfer assist level:  Minimal Assistance - Patient > 75%     Locomotion Ambulation   Ambulation assist      Assist level: Minimal Assistance - Patient > 75% Assistive device: Walker-rolling Max distance: 50   Walk 10 feet activity   Assist     Assist level: Minimal Assistance - Patient > 75% Assistive device: Walker-rolling   Walk 50 feet activity   Assist    Assist level: Minimal Assistance - Patient > 75% Assistive device: Walker-rolling    Walk 150 feet activity   Assist Walk 150 feet activity did not occur: Safety/medical concerns         Walk 10 feet on uneven surface  activity   Assist Walk 10 feet on uneven surfaces activity did not occur: Safety/medical concerns         Wheelchair     Assist Is the patient using a wheelchair?: Yes (for  transport and energy conservation) Type of Wheelchair: Manual    Wheelchair assist level: Dependent - Patient 0%      Wheelchair 50 feet with 2 turns activity    Assist        Assist Level: Dependent - Patient 0%   Wheelchair 150 feet activity     Assist      Assist Level: Dependent - Patient 0%   Blood pressure 109/61, pulse 84, temperature (!) 97.5 F (36.4 C), temperature source Oral, resp. rate 18, height 5\' 5"  (1.651 m), weight 59.8 kg, SpO2 100 %.   Medical Problem List and Plan:  1. Functional deficits secondary to metabolic encephalopathy due to cholecystitis             - patient may  shower             - ELOS/Goals: 7-10 days, Mod I PT/OT - Of note, workup did show MRI brain w/ punctate acute infarct-not clear if this is artifactual or a incidental finding; neurology deemed presentation more consistent with metabolic encephalopathy and gave no further recommendations.-2.  Antithrombotics: -DVT/anticoagulation:  Pharmaceutical: Heparin 5000U Q8H             -antiplatelet therapy: Plavix 75mg  QD   3. Pain Management: Tylenol and robaxin as needed   4. Mood/Behavior/Sleep: LCSW to evaluate and provide emotional support             -antipsychotic agents: n/a   -Melatonin and Trazodone PRN -05/30/23 didn't sleep well last night, doesn't want to schedule meds, advised of PRNs  5. Neuropsych/cognition: This patient is capable of making decisions on her own behalf.   6. Skin/Wound Care: Routine skin care checks; pressure relief measures             -routine drain care   7. Fluids/Electrolytes/Nutrition: Strict Is and Os and follow-up chemistries per nephrology             -renal/carb modified diet             -continue protein supplementation, Renvela TID             -1200 cc FR   8: DM-2, A1c = 7.7%             -CGBs QID and carb mod diet, SSI             -continue Semglee 15 units daily             -continue Novolog 3 units TID WC  -05/30/23 CBGs much  better today, cont to monitor trend  CBG (last 3)  Recent Labs    06/01/23 1837 06/01/23 2116 06/02/23 0550  GLUCAP 153* 158* 147*     elevated this am will increase semglee to 17U- Controlled 6/5  9: Hyperlipidemia: continue Atorvastatin 80mg  QD   10: Hyponatremia: management per nephrology;should be able to address in HD    Latest Ref Rng & Units 06/01/2023    2:10 PM 05/29/2023    1:21 PM 05/28/2023    2:37 PM  BMP  Glucose 70 - 99 mg/dL 161  096  045   BUN 8 - 23 mg/dL 41  39  33   Creatinine 0.44 - 1.00 mg/dL 4.09  8.11  9.14   Sodium 135 - 145 mmol/L 122  124  125   Potassium 3.5 - 5.1 mmol/L 3.1  3.6  3.5   Chloride 98 - 111 mmol/L 85  86  85   CO2 22 - 32 mmol/L 23  22  25    Calcium 8.9 - 10.3 mg/dL 8.6  8.8  8.4       11: Acute on chronic diastolic CHF, pulmonary edema: EF 55-60%             -strict Is and Os and daily weight             -1200 cc FR             -continue Coreg 6.25mg  BID  -05/30/23 wt down today, monitor for trend Filed Weights   06/01/23 1404 06/01/23 1807 06/02/23 0632  Weight: 61.6 kg 60 kg 59.8 kg      12: Hypertension: continue carvedilol 6.25 mg BID  Controlled 6/4  Vitals:   06/01/23 1515 06/01/23 1530 06/01/23 1545 06/01/23 1600  BP: 118/75 (!) 102/59 115/65 90/61   06/01/23 1615 06/01/23 1630 06/01/23 1700 06/01/23 1705  BP: (!) 78/40 (!) 114/59 (!) 86/60 110/60   06/01/23 1730 06/01/23 1805 06/01/23 2021 06/02/23 0632  BP: 95/60 109/71 98/62 109/61      13: Iron deficiency: continue Ferrlecit through 6/02-- completed   14: ESRD on HD via right IJ TDC on 5/20             -HD on T/T/S schedule             -has been accepted at [facility] in [facility] on TTS 11:40    -Appreciate nephrology assistance   15: Anemia: continue IV iron and follow-up CBC on routine labs   16: Cholelithiasis/cholecystitis s/p cholecystostomy tube -leukocytosis resolved, continue Augmentin through 6/01-- had one dose 05/28/23 and then d/c'd, per  pharmacy and d/c summary, end date 5/31 -will need follow up in drain clinic and follow up with Dr. Sheliah Hatch after that to discuss whether she is a candidate for laparoscopic cholecystectomy - Daily flushes 5-10 cc    17: History of CAD: continue Plavix, statin, BB   18: Constipation possible contributing to nausea and lack of appetite             -continue Colace 100mg  BID and Miralax 17g BID; Dulcolax supp prn  -Continue Zofran 4mg  q6h PRN for nausea  -05/30/23 LBM last night, monitor for now    LOS: 5 days A FACE TO FACE EVALUATION WAS PERFORMED  Erick Colace 06/02/2023, 8:49 AM

## 2023-06-02 NOTE — Progress Notes (Signed)
[name] PROGRESS NOTE  Assessment/ Plan: Pt is a 72 y.o. yo female  with past medical history significant for hypertension, type II DM, PAD, CVA, HLD, CKD 3B who presented with shortness of breath and fluid overload.   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  OP HD was arranged at [facility] in [facility] on TTS 11:40 chair time. Plan for PD later on it seems but will need to wait until cholecystitis is better. If not going to pursue PD will need AV access down the line.  HD yesterday with 1.7 L UF, tolerated well.  Repeat BMP today to follow electrolytes.  Plan for next HD tomorrow.   # Acute hypoxic respiratory failure due to fluid overload: Received thoracocentesis and diuresis.  Resolved. Now managing volume with HD.   # HTN/volume: BP acceptable.  No edema.  UF as tolerated.  Only on low dose coreg.  I think we have achieved EDW    # Anemia: Hemoglobin at goal-  no treatment needed as of yet but getting close-  iron low, repleted.   # Acute cholecystitis: HIDA scan was positive therefore on antibiotics.  Seen by general surgery and IR.  S/p cholecystostomy tube placement.   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  # Hyponatremia/hypokalemia: Now managing with with dialysis.  Recommend fluid restriction.  Need a higher potassium bath during HD.  Discussed the primary team  Subjective: Seen and examined.  Denies any complaint or concern.  No new event.  Objective Vital signs in last 24 hours: Vitals:   06/01/23 1805 06/01/23 1807 06/01/23 2021 06/02/23 0632  BP: 109/71  98/62 109/61  Pulse: 99  95 84  Resp: (!) 22  19 18   Temp:   98 F (36.7 C) (!) 97.5 F (36.4 C)  TempSrc:   Oral Oral  SpO2: 100%  96% 100%  Weight:  60 kg  59.8 kg  Height:        Weight change:   Intake/Output Summary (Last 24 hours) at 06/02/2023 1137 Last data filed at 06/02/2023 0908 Gross per 24 hour  Intake 358 ml  Output 1975 ml  Net -1617 ml        Labs: RENAL PANEL Recent Labs  Lab 05/27/23 0443 05/28/23 1437 05/29/23 1321 06/01/23 1410  NA 124* 125* 124* 122*  K 4.1 3.5 3.6 3.1*  CL 84* 85* 86* 85*  CO2 23 25 22 23   GLUCOSE 104* 106* 170* 209*  BUN 58* 33* 39* 41*  CREATININE 6.48* 4.71* 5.26* 5.32*  CALCIUM 8.8* 8.4* 8.8* 8.6*  PHOS 6.2* 3.8 2.8 3.1  ALBUMIN 2.3* 2.4* 2.9* 2.7*     Liver Function Tests: Recent Labs  Lab 05/28/23 1437 05/29/23 1321 06/01/23 1410  ALBUMIN 2.4* 2.9* 2.7*    No results for input(s): "LIPASE", "AMYLASE" in the last 168 hours. No results for input(s): "AMMONIA" in the last 168 hours. CBC: Recent Labs    05/25/23 0218 05/27/23 0443 05/28/23 1437 05/29/23 1321 06/01/23 1410  HGB 10.0* 10.4* 10.2* 11.5* 9.8*  MCV 89.6 91.1 91.9 90.9 90.4  FERRITIN 632*  --   --   --   --   TIBC 172*  --   --   --   --   IRON 13*  --   --   --   --  Cardiac Enzymes: No results for input(s): "CKTOTAL", "CKMB", "CKMBINDEX", "TROPONINI" in the last 168 hours. CBG: Recent Labs  Lab 06/01/23 0947 06/01/23 1132 06/01/23 1837 06/01/23 2116 06/02/23 0550  GLUCAP 139* 159* 153* 158* 147*     Iron Studies: No results for input(s): "IRON", "TIBC", "TRANSFERRIN", "FERRITIN" in the last 72 hours. Studies/Results: No results found.  Medications: Infusions:  sodium chloride      Scheduled Medications:  atorvastatin  80 mg Oral Daily   carvedilol  6.25 mg Oral BID WC   Chlorhexidine Gluconate Cloth  6 each Topical Q0600   Chlorhexidine Gluconate Cloth  6 each Topical Q12H   clopidogrel  75 mg Oral Daily   docusate sodium  100 mg Oral BID   feeding supplement (NEPRO CARB STEADY)  237 mL Oral BID BM   guaiFENesin  600 mg Oral BID   heparin injection (subcutaneous)  5,000 Units Subcutaneous Q8H    insulin aspart  0-15 Units Subcutaneous TID WC   insulin aspart  0-5 Units Subcutaneous QHS   insulin aspart  3 Units Subcutaneous TID WC   insulin glargine-yfgn  15 Units Subcutaneous Daily   polyethylene glycol  17 g Oral BID   sevelamer carbonate  800 mg Oral TID WC    have reviewed scheduled and prn medications.  Physical Exam: General:NAD, comfortable Heart:RRR, s1s2 nl Lungs:clear b/l, no crackle Abdomen:soft, cholecystostomy drain in place. Extremities:No edema Dialysis Access: TDC.  Rishi Vicario Prasad Amias Hutchinson 06/02/2023,11:37 AM  LOS: 5 days

## 2023-06-02 NOTE — Progress Notes (Signed)
Patient ID: Samantha [name], female   DOB: 01-Feb-1951, 72 y.o.   MRN: [id number]  Team Conference Report to Patient/Family  Team Conference discussion was reviewed with the patient and caregiver, including goals, any changes in plan of care and target discharge date.  Patient and caregiver express understanding and are in agreement.  The patient has a target discharge date of 06/04/23.  Sw met with patient and spoke with daughter, [name] via telephone and provided conference updates. Patient will require 24/7 supervision at d/c with trials of MOD I. Daughter, [name] has expressed that she has left work to provide. Daughter plans to reach out to [facility] caseworker to obtain additional resources. No additional questions or concerns.  Andria Rhein 06/02/2023, 1:16 PM

## 2023-06-02 NOTE — Progress Notes (Signed)
Patient ID: Samantha [name], female   DOB: 17-Dec-1951, 72 y.o.   MRN: [id number] Met with the daughter and patient to review renal diet restrictions with handouts for renal friendly Hispanic meal prep recommendations, recipes and specifics for food categories including proteins, milk, fruits/vegetables, sugars/starches and snacks, etc. Per ADA , National Kidney Foundation and [facility] recommendations.  Handouts added to notebook with other information previously reviewed with the daughter and patient. No other questions noted at present. Following to discharge to address educational needs. Pamelia Hoit

## 2023-06-02 NOTE — Progress Notes (Signed)
Noted plan for d/c date of 6/7 per CSW note. Contacted [facility] to inquire if clinic will allow pt to start on Saturday. Clinic will not allow pt to start on Saturday but can start on Tues, Jun 11. This info was provided to rehab MD, CSW, and nephrologist. Also advised by clinic staff that clinic social worker has made arrangements for pt to receive transportation through [employer] through pt's managed medicaid starting June 11. Pt will need to be ready for pick-up by 10:45. This info was provided to CSW as well for d/c planning purposes. Will assist as needed.   Olivia Canter Renal Navigator [phone]

## 2023-06-03 DIAGNOSIS — G9341 Metabolic encephalopathy: Secondary | ICD-10-CM | POA: Diagnosis not present

## 2023-06-03 LAB — RENAL FUNCTION PANEL
Albumin: 3 g/dL — ABNORMAL LOW (ref 3.5–5.0)
Anion gap: 15 (ref 5–15)
BUN: 36 mg/dL — ABNORMAL HIGH (ref 8–23)
CO2: 23 mmol/L (ref 22–32)
Calcium: 9.2 mg/dL (ref 8.9–10.3)
Chloride: 88 mmol/L — ABNORMAL LOW (ref 98–111)
Creatinine, Ser: 5.81 mg/dL — ABNORMAL HIGH (ref 0.44–1.00)
GFR, Estimated: 7 mL/min — ABNORMAL LOW (ref >60)
Glucose, Bld: 189 mg/dL — ABNORMAL HIGH (ref 70–99)
Phosphorus: 3.9 mg/dL (ref 2.5–4.6)
Potassium: 3.7 mmol/L (ref 3.5–5.1)
Sodium: 126 mmol/L — ABNORMAL LOW (ref 135–145)

## 2023-06-03 LAB — CBC
HCT: 30.6 % — ABNORMAL LOW (ref 36.0–46.0)
Hemoglobin: 10.6 g/dL — ABNORMAL LOW (ref 12.0–15.0)
MCH: 31.3 pg (ref 26.0–34.0)
MCHC: 34.6 g/dL (ref 30.0–36.0)
MCV: 90.3 fL (ref 80.0–100.0)
Platelets: 359 10*3/uL (ref 150–400)
RBC: 3.39 MIL/uL — ABNORMAL LOW (ref 3.87–5.11)
RDW: 13.1 % (ref 11.5–15.5)
WBC: 8.2 10*3/uL (ref 4.0–10.5)
nRBC: 0 % (ref 0.0–0.2)

## 2023-06-03 LAB — GLUCOSE, CAPILLARY
Glucose-Capillary: 193 mg/dL — ABNORMAL HIGH (ref 70–99)
Glucose-Capillary: 281 mg/dL — ABNORMAL HIGH (ref 70–99)
Glucose-Capillary: 103 mg/dL — ABNORMAL HIGH (ref 70–99)

## 2023-06-03 MED ORDER — ALBUMIN HUMAN 25 % IV SOLN: 25.0000 g | Freq: Once | INTRAVENOUS | Status: AC

## 2023-06-03 MED ORDER — MIDODRINE HCL 5 MG PO TABS
10.0000 mg | ORAL_TABLET | Freq: Once | ORAL | Status: AC
  Administered 2023-06-03: 10 mg via ORAL
  Filled 2023-06-03: qty 2

## 2023-06-03 MED ORDER — ANTICOAGULANT SODIUM CITRATE 4% (200MG/5ML) IV SOLN: 5.0000 mL | Status: DC | PRN

## 2023-06-03 MED ORDER — ALTEPLASE 2 MG IJ SOLR: 2.0000 mg | Freq: Once | INTRAMUSCULAR | Status: DC | PRN

## 2023-06-03 MED ORDER — HEPARIN SODIUM (PORCINE) 1000 UNIT/ML DIALYSIS
20.0000 [IU]/kg | INTRAMUSCULAR | Status: DC | PRN
  Administered 2023-06-03: 1200 [IU] via INTRAVENOUS_CENTRAL

## 2023-06-03 MED ORDER — HEPARIN SODIUM (PORCINE) 1000 UNIT/ML DIALYSIS
1000.0000 [IU] | INTRAMUSCULAR | Status: DC | PRN
  Filled 2023-06-03 (×2): qty 1

## 2023-06-03 MED ORDER — ALBUMIN HUMAN 25 % IV SOLN
INTRAVENOUS | Status: AC
  Administered 2023-06-03: 25 g via INTRAVENOUS
  Filled 2023-06-03: qty 100

## 2023-06-03 MED ORDER — CALCIUM CARBONATE ANTACID 500 MG PO CHEW
1.0000 | CHEWABLE_TABLET | Freq: Four times a day (QID) | ORAL | Status: DC | PRN
  Administered 2023-06-03 – 2023-06-04 (×2): 200 mg via ORAL
  Filled 2023-06-03 (×2): qty 1

## 2023-06-03 NOTE — Progress Notes (Signed)
Physical Therapy Session Note  Patient Details  Name: Samantha [name] MRN: [id number] Date of Birth: 1951-11-05  Today's Date: 06/03/2023 PT Individual Time: 0905-1006 PT Individual Time Calculation (min): 61 min   Short Term Goals: Week 1:  PT Short Term Goal 1 (Week 1): = to LTGs based on ELOS  Skilled Therapeutic Interventions/Progress Updates:    Pt received sitting in w/c with MD present for morning assessment. In-person interpreter, Lilly, present throughout session. Pt reporting she has been feeling nauseous this morning and is currently trying to eat crackers to make her feel better, but as soon as she put them in her mouth she spits them out stating they make her feel more nauseous. Pt reports she gets a lot of cramping and sometimes throws up during dialysis. Despite all of this, pt agreeable to participate in therapy session per her tolerance. Transported to/from gym in w/c for time management and energy conservation.   Sit>stand w/c>RW with close supervision for safety. Gait training ~50ft using RW with CGA/close supervision - pt continues to have significantly decreased gait speed with prolonged time in bilateral limb support and pushing down heavily through AD for support. She spontaneously reports feeling weak, unstable with her balance, and like her vision is "going black"  - provided seated rest break and assessed vitals: BP 130/67 (MAP 83), HR 96bpm Pt reports that she has cataracts and has been told that her "diabetes is effecting her vision" and pt reports the symptoms she is experiencing during therapy session, she had at baseline.  Discussed option of trying rollator to provide pt with an AD that she could use for seated rest breaks due to her impaired endurance and pt likes the idea. Retrieved rollator and educated pt on brakes. Sit>stand w/c>rollator with CGA for safety. Gait training ~75ft using rollator with CGA for steadying progressed to min assist with fatigue as pt  moving AD back and forth, unable to keep it stable and moving straight forward. Pt reports she does not like this AD as she doesn't feel safe.  Therefore,discussed option of getting a wheelchair to allow her to perform mobility from a seated position due to her impaired activity tolerance - pt very excited about this option stating she has concerns about her being able to manage at home with only the RW. Updated SW who will communicate this with family.  Pt continues to benefit from frequent therapeutic rest breaks due to impaired endurance and likely some self-limiting behaviors.  Gait training to navigate on/off 6" step using RW to ensure recall of education provided yesterday for proper AD management for home entry . Pt demonstrated excellent recall of education and performed x2 reps with CGA and no cuing.   Transported back to her room and pt requesting to return to bed and rest until next therapy session. R stand pivot w/c>EOB using RW with close supervision for safety. Sit>supine mod-I. Pt left supine in bed with needs in reach.   Therapy Documentation Precautions:  Precautions Precautions: Fall, Other (comment) Precaution Comments: denies falls in past 6 months, drain on R side Restrictions Weight Bearing Restrictions: No   Pain:  Continues to report some pain near R drain site, but does not seem to impact her participation.    Therapy/Group: Individual Therapy  Ginny Forth , PT, DPT, NCS, CSRS 06/03/2023, 7:56 AM

## 2023-06-03 NOTE — Progress Notes (Signed)
Patient ID: Samantha [name], female   DOB: Feb 01, 1951, 72 y.o.   MRN: [id number]  Wheelchair ordered through [employer].

## 2023-06-03 NOTE — Progress Notes (Signed)
Noted pt's rehab admission being extended to 6/10 per CSW noted. Contacted Beverly with [facility] out-pt HD to make her aware of pt's d/c date of 6/10 and that pt will start on Tuesday, 6/11. Also requested that clinic social worker please contact pt's daughter to discuss transportation arrangements since clinic social worker has all the details to provide to daughter. Also provided rehab CSW the clinic's phone number to speak with clinic social worker should CSW or pt's daughter desire to do so. Will fax d/c summary and last renal note to clinic on day of d/c.   Olivia Canter Renal Navigator [phone]

## 2023-06-03 NOTE — Progress Notes (Signed)
POST HD TX NOTE  06/03/23 1831  Vitals  Temp (!) 97.5 F (36.4 C)  Temp Source Oral  BP (!) 110/95  MAP (mmHg) 102  BP Location Left Wrist  BP Method Automatic  Patient Position (if appropriate) Lying  Pulse Rate 83  Pulse Rate Source Monitor  ECG Heart Rate 83  Resp 19  Oxygen Therapy  SpO2 100 %  O2 Device Room Air  Pulse Oximetry Type Continuous  During Treatment Monitoring  Intra-Hemodialysis Comments (S)   (post HD tx VS check)  Post Treatment  Dialyzer Clearance Heavily streaked  Duration of HD Treatment -hour(s) 3.5 hour(s)  Hemodialysis Intake (mL) 500 mL ( NS boluses and albumin)  Liters Processed 84  Fluid Removed (mL) 1000 mL ( (value from machine - it took account  of the NS boluses) - albumin - )  Tolerated HD Treatment No (Comment)  Post-Hemodialysis Comments (S)  tx completed w/ bp issues throughout tx, UF goal not met, blood rinsed back, VSS. Medication Admin: Heparin 1200 units bolus, Albumin 25% 25G IVPB, Heparin Dwells 3200units  Hemodialysis Catheter Right Internal jugular Double lumen Permanent (Tunneled)  Placement Date/Time: 05/17/23 1541   Serial / Lot #: [id number]  Expiration Date: 10/28/27  Time Out: Correct patient;Correct site;Correct procedure  Maximum sterile barrier precautions: Hand hygiene;Cap;Mask;Sterile gown;Sterile gloves;Large sterile s...  Site Condition No complications  Blue Lumen Status Heparin locked;Dead end cap in place  Red Lumen Status Heparin locked;Dead end cap in place  Purple Lumen Status N/A  Catheter fill solution Heparin 1000 units/ml  Catheter fill volume (Arterial) 1.6 cc  Catheter fill volume (Venous) 1.6  Dressing Type Transparent  Dressing Status Antimicrobial disc in place;Clean, Dry, Intact  Drainage Description None  Dressing Change Due 06/10/23  Post treatment catheter status Capped and Clamped

## 2023-06-03 NOTE — Progress Notes (Signed)
Occupational Therapy Session Note  Patient Details  Name: Samantha [name] MRN: [id number] Date of Birth: 30-Nov-1951  Today's Date: 06/03/2023 OT Individual Time: 4098-1191 OT Individual Time Calculation (min): 58 min    Short Term Goals: Week 1:  OT Short Term Goal 1 (Week 1): STG=LTG d.t ELOS  Skilled Therapeutic Interventions/Progress Updates:     Pt received sitting up in bed presenting to be in good spirits receptive to skilled OT session reporting 0/10 pain- OT offering intermittent rest breaks, repositioning, and therapeutic support to optimize participation in therapy session. Pt fatigued 2/2 having nausea during the night and not being able to eat much this AM. OT provided therapeutic support and increased rest breaks during session to support participation. In-person interpreter present throughout session to support optimal communication and participation.   Pt transitioned to EOB with supervision. Donned shirt EOB supervision and weaved feet into pants by crossing legs into figure-four position and pulling them to her waist in standing using RW CGA. Pt able to donn jacket, socks, and shoes supervision. Pt reporting fatigue and requesting to complete grooming/hygiene tasks sitting down. Pt able to brush teeth and grooming hair sitting EOB supervision. Educated Pt on energy conservation strategies of taking increased rest breaks, pacing activities, prioritizing activities, and completing activities in seated position when needed.   Educated Pt on updated d/c date and POC for remainder of [facility] hospital stay with Pt receptive to education. MD and RN in/out during therapy session.   Stand step EOB to wc using RW CGA. Transported Pt total A to therapy gym in wc for energy conservation and time management. Pt completed ~17ft of functional mobility to EOB using RW CGA with seated rest break provided following.   Engaged Pt in dynamic standing balance bean bag toss activity with partial  squats and posterior reaching incorporated into task to simulate BADLs. Pt able to complete 10 throws/partial squats x3 trials with prolonged seated rest breaks provided between trials for energy conservation and to support participation.   Pt reporting mild neck pain and stiffness at end of session. Guided Pt through simple ROM neck stretches and provided gentle massage to decrease pain with noted improvement.   Stand step EOM>wc CGA. Transported Pt back to room total A in wc for energy conservation for upcoming PT sessions. Pt requesting crackers to snack on at end of session- OT provided crackers to support increased nutritional intake. Pt was left resting in wc with call bell in reach, seat belt alarm on, and all needs met.    Therapy Documentation Precautions:  Precautions Precautions: Fall, Other (comment) Precaution Comments: denies falls in past 6 months, drain on R side Restrictions Weight Bearing Restrictions: No  Therapy/Group: Individual Therapy  Army Fossa 06/03/2023, 8:04 AM

## 2023-06-03 NOTE — Progress Notes (Signed)
Patient ID: Samantha [name], female   DOB: 05-27-1951, 72 y.o.   MRN: [id number]  Patient extended to 6/10.

## 2023-06-03 NOTE — Progress Notes (Signed)
[facility] NEPHROLOGY PROGRESS NOTE  Assessment/ Plan: Pt is a 72 y.o. yo female  with past medical history significant for hypertension, type II DM, PAD, CVA, HLD, CKD 3B who presented with shortness of breath and fluid overload.   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  OP HD was arranged at [facility] in [facility] on TTS 11:40 chair time. Plan for PD later on it seems but will need to wait until cholecystitis is better. If not going to pursue PD will need AV access down the line.  Plan for next HD today.   # Acute hypoxic respiratory failure due to fluid overload: Received thoracocentesis and diuresis.  Resolved. Now managing volume with HD.   # HTN/volume: BP acceptable.  No edema.  UF as tolerated.  Only on low dose coreg.  I think we have achieved EDW    # Anemia: Hemoglobin at goal-  no treatment needed as of yet but getting close-  iron low, repleted.   # Acute cholecystitis: HIDA scan was positive therefore on antibiotics.  Seen by general surgery and IR.  S/p cholecystostomy tube placement.   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  # Hyponatremia/hypokalemia: Now managing with with dialysis.  Recommend fluid restriction.  Need a higher potassium bath during HD.   Subjective: Seen and examined.  Working with physical therapy.  Reports some dyspepsia.  No chest pain or shortness of breath.  Objective Vital signs in last 24 hours: Vitals:   06/02/23 1416 06/02/23 1959 06/03/23 0423 06/03/23 0500  BP: 91/74 115/73 131/76   Pulse: 95 88 85   Resp: 17 18 17    Temp: 97.6 F (36.4 C) 98.4 F (36.9 C) 98 F (36.7 C)   TempSrc: Oral Oral Oral   SpO2: 100% 98% 99%   Weight:    60.5 kg  Height:       Weight change: -1.1 kg  Intake/Output Summary (Last 24 hours) at  06/03/2023 1013 Last data filed at 06/03/2023 0735 Gross per 24 hour  Intake 703 ml  Output 275 ml  Net 428 ml        Labs: RENAL PANEL Recent Labs  Lab 05/28/23 1437 05/29/23 1321 06/01/23 1410 06/02/23 1030  NA 125* 124* 122* 126*  K 3.5 3.6 3.1* 3.6  CL 85* 86* 85* 89*  CO2 25 22 23 22   GLUCOSE 106* 170* 209* 203*  BUN 33* 39* 41* 22  CREATININE 4.71* 5.26* 5.32* 4.03*  CALCIUM 8.4* 8.8* 8.6* 9.0  PHOS 3.8 2.8 3.1 3.1  ALBUMIN 2.4* 2.9* 2.7* 2.9*     Liver Function Tests: Recent Labs  Lab 05/29/23 1321 06/01/23 1410 06/02/23 1030  ALBUMIN 2.9* 2.7* 2.9*    No results for input(s): "LIPASE", "AMYLASE" in the last 168 hours. No results for input(s): "AMMONIA" in the last 168 hours. CBC: Recent Labs    05/25/23 0218 05/27/23 0443 05/28/23 1437 05/29/23 1321 06/01/23 1410  HGB 10.0* 10.4* 10.2* 11.5* 9.8*  MCV 89.6 91.1 91.9 90.9 90.4  FERRITIN 632*  --   --   --   --   TIBC 172*  --   --   --   --   IRON 13*  --   --   --   --  Cardiac Enzymes: No results for input(s): "CKTOTAL", "CKMB", "CKMBINDEX", "TROPONINI" in the last 168 hours. CBG: Recent Labs  Lab 06/02/23 0550 06/02/23 1141 06/02/23 1646 06/02/23 2106 06/03/23 0602  GLUCAP 147* 170* 195* 166* 193*     Iron Studies: No results for input(s): "IRON", "TIBC", "TRANSFERRIN", "FERRITIN" in the last 72 hours. Studies/Results: No results found.  Medications: Infusions:  sodium chloride      Scheduled Medications:  atorvastatin  80 mg Oral Daily   carvedilol  6.25 mg Oral BID WC   Chlorhexidine Gluconate Cloth  6 each Topical Q0600   Chlorhexidine Gluconate Cloth  6 each Topical Q12H   Chlorhexidine Gluconate Cloth  6 each Topical Q0600   clopidogrel  75 mg Oral Daily   docusate sodium  100 mg Oral BID   feeding supplement (NEPRO CARB STEADY)  237 mL Oral BID BM   guaiFENesin  600 mg Oral BID   heparin injection (subcutaneous)  5,000 Units Subcutaneous Q8H   insulin aspart   0-15 Units Subcutaneous TID WC   insulin aspart  0-5 Units Subcutaneous QHS   insulin aspart  3 Units Subcutaneous TID WC   insulin glargine-yfgn  15 Units Subcutaneous Daily   polyethylene glycol  17 g Oral BID   sevelamer carbonate  800 mg Oral TID WC    have reviewed scheduled and prn medications.  Physical Exam: General:NAD, comfortable Heart:RRR, s1s2 nl Lungs:clear b/l, no crackle Abdomen:soft, cholecystostomy drain in place.  Nontender Extremities:No edema Dialysis Access: TDC.  Samantha [name] Samantha [name] 06/03/2023,10:13 AM  LOS: 6 days

## 2023-06-03 NOTE — Progress Notes (Signed)
Physical Therapy Session Note  Patient Details  Name: Samantha [name] MRN: [id number] Date of Birth: [birth year]  Today's Date: 06/03/2023 PT Individual Time: 1047-1200 PT Individual Time Calculation (min): 73 min   Short Term Goals: Week 1:  PT Short Term Goal 1 (Week 1): = to LTGs based on ELOS  Skilled Therapeutic Interventions/Progress Updates: Pt presents supine in bed and asleep.  Pt arouses and is reluctantly agreeable to therapy.  Pt transfers sup to EOB w/ mod I.  Pt sits EOB to don socks and shoes.  Pt amb x 10' to door, but c/o weakness and hunger.  Pt states never received breakfast, although staff states "refused breakfast."  Pt given some milk and agreeable to therapy.  Pt wheeled to dayroom for energy conservation.  Pt transfers sit to stand w/ CGA to close sup and verbal and visual cues for hand placement for safety.  Pt amb multiple trials w/ RW and CGA to close supervision to 90' including turns to return to seat.  Pt requires extended seated rest breaks 2/2 fatigue.  Pt performed standing clothespin attachments as well as horseshoe toss w/ alternating hands standing on Airex cushion.  Pt performed w/ unilateral UE and then Bilateral challenging balance.  Pt returned to room and amb x 20' from hallway to bed.  Pt doffed shoes/socks and transfers to supine w/ mod I.  Spoke w/ dtr re: use of RW for walker and only using w/c for longer distances.  Pt remained in bed w/ all needs in reach.     Therapy Documentation Precautions:  Precautions Precautions: Fall, Other (comment) Precaution Comments: denies falls in past 6 months, drain on R side Restrictions Weight Bearing Restrictions: No General:   Vital Signs:   Pain: 6/10 neck, but then decreased throughout session.      Therapy/Group: Individual Therapy  Lucio Edward 06/03/2023, 12:12 PM

## 2023-06-03 NOTE — Progress Notes (Deleted)
POST HD TX NOTE  06/03/23 1831  Vitals  Temp (!) 97.5 F (36.4 C)  Temp Source Oral  BP (!) 110/95  MAP (mmHg) 102  BP Location Left Wrist  BP Method Automatic  Patient Position (if appropriate) Lying  Pulse Rate 83  Pulse Rate Source Monitor  ECG Heart Rate 83  Resp 19  Oxygen Therapy  SpO2 100 %  O2 Device Room Air  Pulse Oximetry Type Continuous  During Treatment Monitoring  Intra-Hemodialysis Comments (S)   (post HD tx VS check)  Post Treatment  Dialyzer Clearance Heavily streaked  Duration of HD Treatment -hour(s) 3.5 hour(s)  Hemodialysis Intake (mL) 500 mL ( NS boluses and albumin)  Liters Processed 84  Fluid Removed (mL) 1000 mL ( (value from machine - it took account - )  Tolerated HD Treatment No (Comment)  Post-Hemodialysis Comments (S)  tx completed w/ bp issues throughout tx, UF goal not met, blood rinsed back, VSS. Medication Admin: Heparin 1200 units bolus, Albumin 25% 25G IVPB, Heparin Dwells 3200units  Hemodialysis Catheter Right Internal jugular Double lumen Permanent (Tunneled)  Placement Date/Time: 05/17/23 1541   Serial / Lot #: [id number]  Expiration Date: 10/28/27  Time Out: Correct patient;Correct site;Correct procedure  Maximum sterile barrier precautions: Hand hygiene;Cap;Mask;Sterile gown;Sterile gloves;Large sterile s...  Site Condition No complications  Blue Lumen Status Heparin locked;Dead end cap in place  Red Lumen Status Heparin locked;Dead end cap in place  Purple Lumen Status N/A  Catheter fill solution Heparin 1000 units/ml  Catheter fill volume (Arterial) 1.6 cc  Catheter fill volume (Venous) 1.6  Dressing Type Transparent  Dressing Status Antimicrobial disc in place;Clean, Dry, Intact  Drainage Description None  Dressing Change Due 06/10/23  Post treatment catheter status Capped and Clamped

## 2023-06-03 NOTE — Progress Notes (Signed)
PROGRESS NOTE  Live Spanish language interpreter, pt seen in PT Subjective/Complaints:  Nausea and vomiting last night , Type 5 BM, feels like she has reflux, normally drinks some mild to help with this was told she could not have it (? fluid restriction)   ROS: as per HPI. Denies CP, SOB, abd pain, N/V/D/C, or any other complaints at this time.    Objective:   No results found. Recent Labs    06/01/23 1410  WBC 7.2  HGB 9.8*  HCT 28.4*  PLT 325    Recent Labs    06/01/23 1410 06/02/23 1030  NA 122* 126*  K 3.1* 3.6  CL 85* 89*  CO2 23 22  GLUCOSE 209* 203*  BUN 41* 22  CREATININE 5.32* 4.03*  CALCIUM 8.6* 9.0     Intake/Output Summary (Last 24 hours) at 06/03/2023 0901 Last data filed at 06/03/2023 0735 Gross per 24 hour  Intake 943 ml  Output 275 ml  Net 668 ml         Physical Exam: Vital Signs Blood pressure 131/76, pulse 85, temperature 98 F (36.7 C), temperature source Oral, resp. rate 17, height 5\' 5"  (1.651 m), weight 60.5 kg, SpO2 99 %.   General: No acute distress Mood and affect are appropriate Heart: Regular rate and rhythm no rubs murmurs or extra sounds Lungs: Clear to auscultation, breathing unlabored, no rales or wheezes Abdomen: Positive bowel sounds, RUQ mildly tender to palpation, nondistended Extremities: No clubbing, cyanosis, or edema Skin: No evidence of breakdown, no evidence of rash, chole tube site CDI no erythema or induration , mild tenderness , sutures intact     Musculoskeletal: Full range of motion in all 4 extremities. No joint swelling   prior exam  Lines-  + RUQ drain with green tinged clear fluid output; sutured in; nontender, no erythema.   + Right  IJ TDC dressing dry and intact without tenderness    Assessment/Plan: 1. Functional deficits which require 3+ hours per day of interdisciplinary therapy in a comprehensive inpatient rehab  setting. Physiatrist is providing close team supervision and 24 hour management of active medical problems listed below. Physiatrist and rehab team continue to assess barriers to discharge/monitor patient progress toward functional and medical goals  Care Tool:  Bathing    Body parts bathed by patient: Right arm, Left arm, Chest, Abdomen, Front perineal area, Buttocks, Right upper leg, Left upper leg, Right lower leg, Left lower leg, Face         Bathing assist Assist Level: Minimal Assistance - Patient > 75%     Upper Body Dressing/Undressing Upper body dressing   What is the patient wearing?: Pull over shirt    Upper body assist Assist Level: Contact Guard/Touching assist    Lower Body Dressing/Undressing Lower body dressing      What is the patient wearing?: Pants     Lower body assist Assist for lower body dressing: Contact Guard/Touching assist     Toileting Toileting    Toileting assist Assist for toileting: Contact Guard/Touching assist     Transfers Chair/bed transfer  Transfers assist     Chair/bed transfer assist level: Contact Guard/Touching assist  Chair/bed transfer assistive device: Arboriculturist assist      Assist level: Contact Guard/Touching assist Assistive device: Walker-rolling Max distance: 21ft   Walk 10 feet activity   Assist     Assist level: Contact Guard/Touching assist Assistive device: Walker-rolling   Walk 50 feet activity   Assist    Assist level: Contact Guard/Touching assist Assistive device: Walker-rolling    Walk 150 feet activity   Assist Walk 150 feet activity did not occur: Safety/medical concerns         Walk 10 feet on uneven surface  activity   Assist Walk 10 feet on uneven surfaces activity did not occur: Safety/medical concerns         Wheelchair     Assist Is the patient using a wheelchair?: Yes (for transport and energy conservation) Type of  Wheelchair: Manual    Wheelchair assist level: Dependent - Patient 0%      Wheelchair 50 feet with 2 turns activity    Assist        Assist Level: Dependent - Patient 0%   Wheelchair 150 feet activity     Assist      Assist Level: Dependent - Patient 0%   Blood pressure 131/76, pulse 85, temperature 98 F (36.7 C), temperature source Oral, resp. rate 17, height 5\' 5"  (1.651 m), weight 60.5 kg, SpO2 99 %.   Medical Problem List and Plan:  1. Functional deficits secondary to metabolic encephalopathy due to cholecystitis             - patient may  shower             - ELOS/Goals: 6/10 Mod I PT/OT - Of note, workup did show MRI brain w/ punctate acute infarct-not clear if this is artifactual or a incidental finding; neurology deemed presentation more consistent with metabolic encephalopathy and gave no further recommendations.-2.  Antithrombotics: -DVT/anticoagulation:  Pharmaceutical: Heparin 5000U Q8H             -antiplatelet therapy: Plavix 75mg  QD   3. Pain Management: Tylenol and robaxin as needed   4. Mood/Behavior/Sleep: LCSW to evaluate and provide emotional support             -antipsychotic agents: n/a   -Melatonin and Trazodone PRN -05/30/23 didn't sleep well last night, doesn't want to schedule meds, advised of PRNs  5. Neuropsych/cognition: This patient is capable of making decisions on her own behalf.   6. Skin/Wound Care: Routine skin care checks; pressure relief measures             -routine drain care   7. Fluids/Electrolytes/Nutrition: Strict Is and Os and follow-up chemistries per nephrology             -renal/carb modified diet             -continue protein supplementation, Renvela TID             -1200 cc FR   8: DM-2, A1c = 7.7%             -CGBs QID and carb mod diet, SSI             -continue Semglee 15 units daily             -continue Novolog 3 units TID WC  -05/30/23 CBGs much better today, cont to monitor trend  CBG (last 3)   Recent Labs    06/02/23 1646 06/02/23 2106 06/03/23 0602  GLUCAP  195* 166* 193*     elevated this am will increase semglee to 20U  9: Hyperlipidemia: continue Atorvastatin 80mg  QD   10: Hyponatremia: management per nephrology;should be able to address in HD    Latest Ref Rng & Units 06/02/2023   10:30 AM 06/01/2023    2:10 PM 05/29/2023    1:21 PM  BMP  Glucose 70 - 99 mg/dL 161  096  045   BUN 8 - 23 mg/dL 22  41  39   Creatinine 0.44 - 1.00 mg/dL 4.09  8.11  9.14   Sodium 135 - 145 mmol/L 126  122  124   Potassium 3.5 - 5.1 mmol/L 3.6  3.1  3.6   Chloride 98 - 111 mmol/L 89  85  86   CO2 22 - 32 mmol/L 22  23  22    Calcium 8.9 - 10.3 mg/dL 9.0  8.6  8.8       11: Acute on chronic diastolic CHF, pulmonary edema: EF 55-60%             -strict Is and Os and daily weight             -1200 cc FR             -continue Coreg 6.25mg  BID  -05/30/23 wt down today, monitor for trend Filed Weights   06/01/23 1807 06/02/23 0632 06/03/23 0500  Weight: 60 kg 59.8 kg 60.5 kg      12: Hypertension: continue carvedilol 6.25 mg BID  Controlled 6/4  Vitals:   06/01/23 1600 06/01/23 1615 06/01/23 1630 06/01/23 1700  BP: 90/61 (!) 78/40 (!) 114/59 (!) 86/60   06/01/23 1705 06/01/23 1730 06/01/23 1805 06/01/23 2021  BP: 110/60 95/60 109/71 98/62   06/02/23 0632 06/02/23 1416 06/02/23 1959 06/03/23 0423  BP: 109/61 91/74 115/73 131/76      13: Iron deficiency: continue Ferrlecit through 6/02-- completed   14: ESRD on HD via right IJ TDC on 5/20             -HD on T/T/S schedule             -has been accepted at [facility] in [facility] on TTS 11:40    -Appreciate nephrology assistance   15: Anemia: continue IV iron and follow-up CBC on routine labs   16: Cholelithiasis/cholecystitis s/p cholecystostomy tube -leukocytosis resolved, continue Augmentin through 6/01-- had one dose 05/28/23 and then d/c'd, per pharmacy and d/c summary, end date 5/31 -will need follow up in drain  clinic and follow up with Dr. Sheliah Hatch after that to discuss whether she is a candidate for laparoscopic cholecystectomy - Daily flushes 5-10 cc    17: History of CAD: continue Plavix, statin, BB   18: Constipation possible contributing to nausea and lack of appetite             -continue Colace 100mg  BID and Miralax 17g BID; Dulcolax supp prn  -Continue Zofran 4mg  q6h PRN for nausea  -05/30/23 LBM last night, monitor for now    LOS: 6 days A FACE TO FACE EVALUATION WAS PERFORMED  Erick Colace 06/03/2023, 9:01 AM

## 2023-06-04 LAB — GLUCOSE, CAPILLARY
Glucose-Capillary: 126 mg/dL — ABNORMAL HIGH (ref 70–99)
Glucose-Capillary: 195 mg/dL — ABNORMAL HIGH (ref 70–99)
Glucose-Capillary: 113 mg/dL — ABNORMAL HIGH (ref 70–99)

## 2023-06-04 MED ORDER — CHLORHEXIDINE GLUCONATE CLOTH 2 % EX PADS
6.0000 | MEDICATED_PAD | Freq: Every day | CUTANEOUS | Status: DC
  Administered 2023-06-06: 6 via TOPICAL

## 2023-06-04 NOTE — Progress Notes (Signed)
Patient ID: Samantha [name], female   DOB: 1951-10-11, 72 y.o.   MRN: [id number]   OP referral sent to [facility]

## 2023-06-04 NOTE — Progress Notes (Signed)
Patient not exhibiting signs of aspiration. No liquids dysphagia. Family and dietary services have assisted with food preferences. RD reporting abdominal discomfort, nausea with vomiting. I spoke with her via interpretive services and she is complaining of abdominal bloating, gas, and constipation. She vomited once several evenings ago. I explained this is most likely due to her chronic cholecystitis and will be addressed by surgery at outpatient follow-up. She remains afebrile with normal WBC. Abdomen is minimally tender to palpation, soft, without guarding.  Will provide simethicone prn and continue anti-constipation relief.

## 2023-06-04 NOTE — Progress Notes (Signed)
Physical Therapy Session Note  Patient Details  Name: Samantha [name] MRN: [id number] Date of Birth: 08-Jul-1951  Today's Date: 06/04/2023 PT Individual Time: 0800-0900 and 1302-1415 PT Individual Time Calculation (min): 60 min and 73 min  Short Term Goals: Week 1:  PT Short Term Goal 1 (Week 1): = to LTGs based on ELOS  Skilled Therapeutic Interventions/Progress Updates:   First session:Pt presents supine in bed and agreeable to therapy per on-line interpreter [phone] Jilda Panda) until in-person interpreter arrived.  Pt transfers sup to sit w/ mod I and sat EOB for dressing w/ set-up (bringing bag to bed).  Pt states no pain to tube site.  Pt transfers sit to stand w/ supervision w/ proper hand placement.  Pt amb x 5' to w/c w/ close supervision.  Pt wheeled to dayroom.  Pt amb x 15' w/ RW and close sup to Nu-step.  Pt required increased encouragement to participate in Nu-step 3' x 2 w/ resistance initially at 4 and decreased to 2 to participate for the full time.  Pt performed average 29 spm and total steps of 184.  Pt amb multiple trials of 31' w/ RW and close sup, cues for turning/walker management to return to w/c.  Pt amb w/ HHA and CGA/min A x 90' but decreased BOS/scissoring w/ turn to return.  Pt remained sitting in w/c, OT to start at 900.  All needs in reach, interpreter remains in room.  Second session:  Pt presents sitting almost EOB w/ feet over edge below the siderail.  Pt given shoes and dons at EOB.  Pt transfers sit to stand w/ close supervision.  Pt wheeled outside for change in scenery and for gait on uneven surfaces.  Pt requires encouragement to continue even when c/o fatigue, but does participate.  Pt amb at least 100' per trial w/ RW and CGA/close supervision.  Pt rests on various outdoor surfaces, seat and benches.  Pt requires seated rest breaks between trials, w/ family present.  Pt stands to take picture w/ granddaughter.  Pt amb x 10' to truck height car simulator w/  supervision.  Pt states running board present on personal vehicle to assist w/ transfer.  Pt amb up/down ramped surface w/ close supervision to CGA and fatigue.  Pt amb x 100' w/ RW down hallway to room and returned to supine in bed w/ mod I.  Pt doffs shoes and socks.  Bed alarm on and all needs in reach.     Therapy Documentation Precautions:  Precautions Precautions: Fall, Other (comment) Precaution Comments: denies falls in past 6 months, drain on R side Restrictions Weight Bearing Restrictions: No General:   Vital Signs: Therapy Vitals Temp: 98.2 F (36.8 C) Temp Source: Oral Pulse Rate: 89 Resp: 18 BP: 123/64 Patient Position (if appropriate): Lying Oxygen Therapy SpO2: 99 % O2 Device: Room Air Pain:no c/o, heaviness in head/fatigue only.      Therapy/Group: Individual Therapy  Lucio Edward 06/04/2023, 9:02 AM

## 2023-06-04 NOTE — Progress Notes (Signed)
Occupational Therapy Session Note  Patient Details  Name: Samantha [name] MRN: [id number] Date of Birth: 28-Jul-1951  Today's Date: 06/04/2023 OT Individual Time: 0905-1000 OT Individual Time Calculation (min): 55 min    Short Term Goals: Week 1:  OT Short Term Goal 1 (Week 1): STG=LTG d.t ELOS  Skilled Therapeutic Interventions/Progress Updates:  Skilled OT intervention completed with focus on functional transfers, BUE strengthening, pain management. Pt received seated in w/c, agreeable to session. Intermittent pain reported in R scapula, head and RLQ throughout session; nurse notified of med request. OT offered rest breaks and repositioning throughout for pain reduction.  Translator present during session. Pt declined self-care needs. Transported dependently in w/c <> gym for energy conservation. CGA sit > stand and stand pivot to EOM.   Upon transferring, pt indicated she felt "dizzy, weak, black vision and pulling sensation on RLQ." BP assessed at 103/92, HR WNL. Offered to retrieve nursing, toileting opportunity, however after a few mins above symptoms resolved with pt agreeable to continue with session.   Completed the following exercises to promote BUE strength needed for BADL: (With yellow theraband) 10 reps Horizontal abduction Self-anchored shoulder flexion each arm Self-anchored bicep flexion each arm  Pt then complained of inability to sit without back support, and despite gentle encouragement requested to get back into w/c. Completed CGA sit > stand with preferred L HHA, then CGA stand pivot to w/c. Pt then proceeded to request to return to room for nursing assessment.  Back in room, OT assisted pt with CGA ambulatory transfer to EOB. Nurse notified and present for full set of vitals, all of them WNL and all symptoms reportedly resolved. Water provided.  Pt receptive to finishing exercises at bed level for the following: (With yellow theraband anchored on bed rail) 10  reps each arm Chest presses Shoulder external/internal rotation Shoulder extension  Pt remained upright, with bed alarm on/activated, and with all needs in reach at end of session.   Therapy Documentation Precautions:  Precautions Precautions: Fall, Other (comment) Precaution Comments: denies falls in past 6 months, drain on R side Restrictions Weight Bearing Restrictions: No    Therapy/Group: Individual Therapy  Melvyn Novas, MS, OTR/L  06/04/2023, 12:08 PM

## 2023-06-04 NOTE — Progress Notes (Signed)
Inpatient Rehabilitation Care Coordinator Discharge Note   Patient Details  Name: Samantha [name] MRN: [id number] Date of Birth: [birth year]   Discharge location: Home  Length of Stay: 10 Days  Discharge activity level: Supervision  Home/community participation: Daughters  Patient response WU:[id number] Literacy - How often do you need to have someone help you when you read instructions, pamphlets, or other written material from your doctor or pharmacy?: Always  Patient response YN:WGNFAO Isolation - How often do you feel lonely or isolated from those around you?: Never  Services provided included: SW, Pharmacy, TR, CM, RN, SLP, OT, PT, RD, MD  Financial Services:  Financial Services Utilized: Medicaid    Choices offered to/list presented to: Patient and daughter  Follow-up services arranged:  Outpatient    Outpatient Servicies: [facility]      Patient response to transportation need: Is the patient able to respond to transportation needs?: Yes In the past 12 months, has lack of transportation kept you from medical appointments or from getting medications?: No In the past 12 months, has lack of transportation kept you from meetings, work, or from getting things needed for daily living?: No   Patient/Family verbalized understanding of follow-up arrangements:  Yes  Individual responsible for coordination of the follow-up plan: [name] [phone]  Confirmed correct DME delivered: Andria Rhein 06/04/2023    Comments (or additional information):***  Summary of Stay    Date/Time Discharge Planning CSW  06/02/23 0911 Goal to d/c home MOD I, dtrs currently work. If patient requires 24/7 supervision, daughter fabiola plans to assist CJB       Andria Rhein

## 2023-06-04 NOTE — Progress Notes (Addendum)
Nutrition Follow-up  DOCUMENTATION CODES:   Not applicable  INTERVENTION:  Discuss nutrition education with patient regarding renal preservation   Continue Nepro Shake po BID, each supplement provides 425 kcal and 19 grams protein  suitable for lactose intolerance   Liberalize diet to renal so patient may have more choices and po intake can improve   Encourage po intake   Communicate with SLP and Executive Chef  NUTRITION DIAGNOSIS:   Inadequate oral intake related to poor appetite as evidenced by meal completion < 50%.  Progressing with po intake and ONS   GOAL:   Patient will meet greater than or equal to 90% of their needs  Ongoing   MONITOR:   PO intake, Supplement acceptance  REASON FOR ASSESSMENT:   Consult Assessment of nutrition requirement/status, Diet education  ASSESSMENT:   72 y.o. female admits to CIR related to functional deficits secondary to metabolic encephalopathy due to cholecystitis. PMH includes: acute pyelonephritis, bacteremia, HLD, PAD, T2DM.  Visited patient at bedside whose family was present. RD used the interpreted iPad. Patient reports she ate all her breakfast this morning although it was just pancakes and a beverage. She also had just finished some food that was brought in by family.   Per MD, renal handouts have been provided and patient's daughter states it was in [place] and Spanish so patient will be able to understand. She reports she does not eat potatoes anymore and has learned to eat better regarding renal status.   Patient reports she has drank about 3 nepros since they have been ordered for her. She states she can tolerate a little bit of them but is lactose intolerant and not always  able to finish her shakes. Patient reports she still drinks milk as well. RD unsure if patient actually has lactose intolerance since Nepro is suitable for this particular condition. RD encouraged her to do her best and not force it if she is feeling  GI discomfort.   Labs: Glu 189, Na 126, Cr 5.81 Meds: lipitor, plavix, colace, Nepro BID, insulin, miralax, renvela Admit wt: 141#, CBW: 128#  PO: 10-50%  I/O's: -3 L (since admission)  Addendum:  Executive chef reports that while he was patient rounding, he was able to converse with patient in Spanish and she reports trouble swallowing and not tolerating most of the meats she has been sent. She reports abdominal discomfort and nausea/vomiting. Chef has made a personalized menu for patient to follow (Breakfast will be boiled egg, renal fruit cup and coffee; lunch and dinner will be tilapia, corn and green beans while she is in patient. RD to escalate swallowing issues to SLP.   Diet Order:   Diet Order             Diet renal/carb modified with fluid restriction Diet-HS Snack? Nothing; Fluid restriction: 1200 mL Fluid; Room service appropriate? Yes; Fluid consistency: Thin  Diet effective now                   EDUCATION NEEDS:   Not appropriate for education at this time  Skin:  Skin Assessment: Skin Integrity Issues: Skin Integrity Issues:: Incisions Incisions: right lower flank  Last BM:  6/6  Height:   Ht Readings from Last 1 Encounters:  05/28/23 5\' 5"  (1.651 m)    Weight:   Wt Readings from Last 1 Encounters:  06/04/23 58 kg    Ideal Body Weight:     BMI:  Body mass index is 21.28 kg/m.  Estimated  Nutritional Needs:   Kcal:  1530-1835 kcals  Protein:  75-90 gm  Fluid:  </= 1.2 L    Leodis Rains, RDN, LDN  Clinical Nutrition

## 2023-06-04 NOTE — Progress Notes (Signed)
[facility] PROGRESS NOTE  Assessment/ Plan: Pt is a 72 y.o. yo female  with past medical history significant for hypertension, type II DM, PAD, CVA, HLD, CKD 3B who presented with shortness of breath and fluid overload.   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  OP HD was arranged at [facility] in [facility] on TTS 11:40 chair time. Tolerated HD Thur but upset she had to wait awhile for transport. I explained to the pt and daughter who was bedside that that sometimes does happen because of transport but fortunately not often.   In the future PD may be an option but would wait until cholecystitis is better. If not going to pursue PD will need AV access down the line. Would hold off for now as don't want to affect PT which is why she's still here.  Plan for next HD tomorrow.   # Acute hypoxic respiratory failure due to fluid overload: Received thoracocentesis and diuresis.  Resolved. Now managing volume with HD.   # HTN/volume: BP acceptable.  No edema.  UF as tolerated.  Only on low dose coreg.  I think we have achieved EDW    # Anemia: Hemoglobin at goal-  no treatment needed as of yet but getting close-  iron low, repleted.   # Acute cholecystitis: HIDA scan was positive therefore on antibiotics.  Seen by general surgery and IR.  S/p cholecystostomy tube placement.   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  # Hyponatremia/hypokalemia: Now managing with with dialysis.  Recommend fluid restriction.  Need a higher potassium bath during HD.   Subjective: Seen and examined.  Working with physical therapy.  Reports some dyspepsia.  No chest pain or shortness of breath.  Objective Vital signs in last 24 hours: Vitals:   06/03/23 1932 06/04/23 0523 06/04/23 0802 06/04/23  0948  BP: 109/65 103/70 123/64 122/72  Pulse: 94 86 89 93  Resp: 18 18    Temp:  98.2 F (36.8 C)    TempSrc:  Oral    SpO2: 100% 100% 99% 99%  Weight:  58 kg    Height:       Weight change: -1.1 kg  Intake/Output Summary (Last 24 hours) at 06/04/2023 1154 Last data filed at 06/04/2023 1130 Gross per 24 hour  Intake 477 ml  Output 1350 ml  Net -873 ml       Labs: RENAL PANEL Recent Labs  Lab 05/28/23 1437 05/29/23 1321 06/01/23 1410 06/02/23 1030 06/03/23 1455  NA 125* 124* 122* 126* 126*  K 3.5 3.6 3.1* 3.6 3.7  CL 85* 86* 85* 89* 88*  CO2 25 22 23 22 23   GLUCOSE 106* 170* 209* 203* 189*  BUN 33* 39* 41* 22 36*  CREATININE 4.71* 5.26* 5.32* 4.03* 5.81*  CALCIUM 8.4* 8.8* 8.6* 9.0 9.2  PHOS 3.8 2.8 3.1 3.1 3.9  ALBUMIN 2.4* 2.9* 2.7* 2.9* 3.0*    Liver Function Tests: Recent Labs  Lab 06/01/23 1410 06/02/23 1030 06/03/23 1455  ALBUMIN 2.7* 2.9* 3.0*   No results for input(s): "LIPASE", "AMYLASE" in the last 168 hours. No results for input(s): "AMMONIA" in the last 168 hours. CBC: Recent Labs    05/25/23 0218 05/27/23 0443 05/28/23 1437 05/29/23 1321 06/01/23 1410 06/03/23 1445  HGB 10.0* 10.4* 10.2* 11.5*  9.8* 10.6*  MCV 89.6 91.1 91.9 90.9 90.4 90.3  FERRITIN 632*  --   --   --   --   --   TIBC 172*  --   --   --   --   --   IRON 13*  --   --   --   --   --     Cardiac Enzymes: No results for input(s): "CKTOTAL", "CKMB", "CKMBINDEX", "TROPONINI" in the last 168 hours. CBG: Recent Labs  Lab 06/03/23 0602 06/03/23 1104 06/03/23 2101 06/04/23 0529 06/04/23 1137  GLUCAP 193* 281* 103* 126* 195*    Iron Studies: No results for input(s): "IRON", "TIBC", "TRANSFERRIN", "FERRITIN" in the last 72 hours. Studies/Results: No results found.  Medications: Infusions:  sodium chloride      Scheduled Medications:  atorvastatin  80 mg Oral Daily   carvedilol  6.25 mg Oral BID WC   Chlorhexidine Gluconate Cloth  6 each Topical Q0600    Chlorhexidine Gluconate Cloth  6 each Topical Q12H   Chlorhexidine Gluconate Cloth  6 each Topical Q0600   clopidogrel  75 mg Oral Daily   docusate sodium  100 mg Oral BID   feeding supplement (NEPRO CARB STEADY)  237 mL Oral BID BM   guaiFENesin  600 mg Oral BID   heparin injection (subcutaneous)  5,000 Units Subcutaneous Q8H   insulin aspart  0-15 Units Subcutaneous TID WC   insulin aspart  0-5 Units Subcutaneous QHS   insulin aspart  3 Units Subcutaneous TID WC   insulin glargine-yfgn  15 Units Subcutaneous Daily   polyethylene glycol  17 g Oral BID   sevelamer carbonate  800 mg Oral TID WC    have reviewed scheduled and prn medications.  Physical Exam: General:NAD, comfortable Heart:RRR, s1s2 nl Lungs:clear b/l, no crackle Abdomen:soft, cholecystostomy drain in place.  Nontender Extremities:No edema Dialysis Access: TDC.  Robinette Esters W 06/04/2023,11:54 AM  LOS: 7 days

## 2023-06-04 NOTE — Progress Notes (Signed)
Patient ID: Samantha [name], female   DOB: 1951-03-29, 72 y.o.   MRN: [id number]  Op referral sent to [facility].

## 2023-06-04 NOTE — Progress Notes (Signed)
PROGRESS NOTE  Live Spanish language interpreter, pt seen in PT Subjective/Complaints:  Feels better today , no drain site pain  ROS: as per HPI. Denies CP, SOB, abd pain, N/V/D/C, or any other complaints at this time.    Objective:   No results found. Recent Labs    06/01/23 1410 06/03/23 1445  WBC 7.2 8.2  HGB 9.8* 10.6*  HCT 28.4* 30.6*  PLT 325 359    Recent Labs    06/02/23 1030 06/03/23 1455  NA 126* 126*  K 3.6 3.7  CL 89* 88*  CO2 22 23  GLUCOSE 203* 189*  BUN 22 36*  CREATININE 4.03* 5.81*  CALCIUM 9.0 9.2     Intake/Output Summary (Last 24 hours) at 06/04/2023 0841 Last data filed at 06/04/2023 0837 Gross per 24 hour  Intake 477 ml  Output 1300 ml  Net -823 ml         Physical Exam: Vital Signs Blood pressure 123/64, pulse 89, temperature 98.2 F (36.8 C), temperature source Oral, resp. rate 18, height 5\' 5"  (1.651 m), weight 58 kg, SpO2 99 %.   General: No acute distress Mood and affect are appropriate Heart: Regular rate and rhythm no rubs murmurs or extra sounds Lungs: Clear to auscultation, breathing unlabored, no rales or wheezes Abdomen: Positive bowel sounds, RUQ mildly tender to palpation, nondistended Extremities: No clubbing, cyanosis, or edema Skin: No evidence of breakdown, no evidence of rash, chole tube site CDI no erythema or induration , mild tenderness , sutures intact     Musculoskeletal: Full range of motion in all 4 extremities. No joint swelling   prior exam  Lines-  + RUQ drain with green tinged clear fluid output; sutured in; nontender, no erythema.   + Right  IJ TDC dressing dry and intact without tenderness    Assessment/Plan: 1. Functional deficits which require 3+ hours per day of interdisciplinary therapy in a comprehensive inpatient rehab setting. Physiatrist is providing close team supervision and 24 hour management of active medical problems listed  below. Physiatrist and rehab team continue to assess barriers to discharge/monitor patient progress toward functional and medical goals  Care Tool:  Bathing    Body parts bathed by patient: Right arm, Left arm, Chest, Abdomen, Front perineal area, Buttocks, Right upper leg, Left upper leg, Right lower leg, Left lower leg, Face         Bathing assist Assist Level: Minimal Assistance - Patient > 75%     Upper Body Dressing/Undressing Upper body dressing   What is the patient wearing?: Pull over shirt    Upper body assist Assist Level: Contact Guard/Touching assist    Lower Body Dressing/Undressing Lower body dressing      What is the patient wearing?: Pants     Lower body assist Assist for lower body dressing: Contact Guard/Touching assist     Toileting Toileting    Toileting assist Assist for toileting: Contact Guard/Touching assist     Transfers Chair/bed transfer  Transfers assist     Chair/bed transfer assist level: Contact Guard/Touching assist Chair/bed transfer assistive device: Geologist, engineering   Ambulation assist      Assist level:  Contact Guard/Touching assist Assistive device: Walker-rolling Max distance: 90   Walk 10 feet activity   Assist     Assist level: Contact Guard/Touching assist Assistive device: Walker-rolling   Walk 50 feet activity   Assist    Assist level: Contact Guard/Touching assist Assistive device: Walker-rolling    Walk 150 feet activity   Assist Walk 150 feet activity did not occur: Safety/medical concerns         Walk 10 feet on uneven surface  activity   Assist Walk 10 feet on uneven surfaces activity did not occur: Safety/medical concerns         Wheelchair     Assist Is the patient using a wheelchair?: Yes (for transport and energy conservation) Type of Wheelchair: Manual    Wheelchair assist level: Dependent - Patient 0%      Wheelchair 50 feet with 2 turns  activity    Assist        Assist Level: Dependent - Patient 0%   Wheelchair 150 feet activity     Assist      Assist Level: Dependent - Patient 0%   Blood pressure 123/64, pulse 89, temperature 98.2 F (36.8 C), temperature source Oral, resp. rate 18, height 5\' 5"  (1.651 m), weight 58 kg, SpO2 99 %.   Medical Problem List and Plan:  1. Functional deficits secondary to metabolic encephalopathy due to cholecystitis             - patient may  shower             - ELOS/Goals: 6/10 Mod I PT/OT - Of note, workup did show MRI brain w/ punctate acute infarct-not clear if this is artifactual or a incidental finding; neurology deemed presentation more consistent with metabolic encephalopathy and gave no further recommendations.-2.  Antithrombotics: -DVT/anticoagulation:  Pharmaceutical: Heparin 5000U Q8H             -antiplatelet therapy: Plavix 75mg  QD   3. Pain Management: Tylenol and robaxin as needed   4. Mood/Behavior/Sleep: LCSW to evaluate and provide emotional support             -antipsychotic agents: n/a   -Melatonin and Trazodone PRN -05/30/23 didn't sleep well last night, doesn't want to schedule meds, advised of PRNs  5. Neuropsych/cognition: This patient is capable of making decisions on her own behalf.   6. Skin/Wound Care: Routine skin care checks; pressure relief measures             -routine drain care   7. Fluids/Electrolytes/Nutrition: Strict Is and Os and follow-up chemistries per nephrology             -renal/carb modified diet             -continue protein supplementation, Renvela TID             -1200 cc FR   8: DM-2, A1c = 7.7%             -CGBs QID and carb mod diet, SSI             -continue Semglee 15 units daily             -continue Novolog 3 units TID WC  -05/30/23 CBGs much better today, cont to monitor trend  CBG (last 3)  Recent Labs    06/03/23 1104 06/03/23 2101 06/04/23 0529  GLUCAP 281* 103* 126*     elevated this am will  increase semglee to 20U  9: Hyperlipidemia: continue Atorvastatin  80mg  QD   10: Hyponatremia: management per nephrology;should be able to address in HD    Latest Ref Rng & Units 06/03/2023    2:55 PM 06/02/2023   10:30 AM 06/01/2023    2:10 PM  BMP  Glucose 70 - 99 mg/dL 119  147  829   BUN 8 - 23 mg/dL 36  22  41   Creatinine 0.44 - 1.00 mg/dL 5.62  1.30  8.65   Sodium 135 - 145 mmol/L 126  126  122   Potassium 3.5 - 5.1 mmol/L 3.7  3.6  3.1   Chloride 98 - 111 mmol/L 88  89  85   CO2 22 - 32 mmol/L 23  22  23    Calcium 8.9 - 10.3 mg/dL 9.2  9.0  8.6       11: Acute on chronic diastolic CHF, pulmonary edema: EF 55-60%             -strict Is and Os and daily weight             -1200 cc FR             -continue Coreg 6.25mg  BID  -05/30/23 wt down today, monitor for trend Filed Weights   06/03/23 1431 06/03/23 1831 06/04/23 0523  Weight: 59.4 kg 58.4 kg 58 kg      12: Hypertension: continue carvedilol 6.25 mg BID  Controlled 6/7  Vitals:   06/03/23 1704 06/03/23 1710 06/03/23 1716 06/03/23 1730  BP: (!) 58/46 (!) 78/51 92/69 (!) 88/59   06/03/23 1747 06/03/23 1748 06/03/23 1800 06/03/23 1826  BP: (!) 76/35 (!) 98/48 (!) 90/44 (!) 128/40   06/03/23 1831 06/03/23 1932 06/04/23 0523 06/04/23 0802  BP: (!) 110/95 109/65 103/70 123/64      13: Iron deficiency: continue Ferrlecit through 6/02-- completed   14: ESRD on HD via right IJ TDC on 5/20             -HD on T/T/S schedule             -has been accepted at [facility] in [facility] on TTS 11:40    -Appreciate nephrology assistance   15: Anemia: continue IV iron and follow-up CBC on routine labs   16: Cholelithiasis/cholecystitis s/p cholecystostomy tube -leukocytosis resolved, continue Augmentin through 6/01-- had one dose 05/28/23 and then d/c'd, per pharmacy and d/c summary, end date 5/31 -will need follow up in drain clinic and follow up with Dr. Sheliah Hatch after that to discuss whether she is a candidate for  laparoscopic cholecystectomy - Daily flushes 5-10 cc    17: History of CAD: continue Plavix, statin, BB   18: Constipation possible contributing to nausea and lack of appetite             -continue Colace 100mg  BID and Miralax 17g BID; Dulcolax supp prn  -Continue Zofran 4mg  q6h PRN for nausea  -05/30/23 LBM last night, monitor for now    LOS: 7 days A FACE TO FACE EVALUATION WAS PERFORMED  Erick Colace 06/04/2023, 8:41 AM

## 2023-06-04 NOTE — Progress Notes (Signed)
Therapy told nurse that patient is c/o dizziness; vital signs done WNL; patient further c/o pain on her right shoulder and gas on her belly; pain med and tums given. We'll monitor.

## 2023-06-04 NOTE — Progress Notes (Signed)
Oceanographer was able to talk to the patient and daughter and said he will be preparing a specific menu for the patient. Per supervisor patient's main complaint is that she's not able to swallow the food they are giving her claims it is getting stuck on her throat, Supervisor is requesting if she can have a swallow test for the patient. Dois Davenport PA made aware.

## 2023-06-05 LAB — RENAL FUNCTION PANEL
Albumin: 3.5 g/dL (ref 3.5–5.0)
Anion gap: 20 — ABNORMAL HIGH (ref 5–15)
BUN: 29 mg/dL — ABNORMAL HIGH (ref 8–23)
CO2: 26 mmol/L (ref 22–32)
Calcium: 9.5 mg/dL (ref 8.9–10.3)
Chloride: 81 mmol/L — ABNORMAL LOW (ref 98–111)
Creatinine, Ser: 5.72 mg/dL — ABNORMAL HIGH (ref 0.44–1.00)
GFR, Estimated: 8 mL/min — ABNORMAL LOW (ref >60)
Glucose, Bld: 228 mg/dL — ABNORMAL HIGH (ref 70–99)
Phosphorus: 4.2 mg/dL (ref 2.5–4.6)
Potassium: 3.4 mmol/L — ABNORMAL LOW (ref 3.5–5.1)
Sodium: 127 mmol/L — ABNORMAL LOW (ref 135–145)

## 2023-06-05 LAB — CBC
HCT: 31.3 % — ABNORMAL LOW (ref 36.0–46.0)
Hemoglobin: 10.8 g/dL — ABNORMAL LOW (ref 12.0–15.0)
MCH: 31.3 pg (ref 26.0–34.0)
MCHC: 34.5 g/dL (ref 30.0–36.0)
MCV: 90.7 fL (ref 80.0–100.0)
Platelets: 350 10*3/uL (ref 150–400)
RBC: 3.45 MIL/uL — ABNORMAL LOW (ref 3.87–5.11)
RDW: 13.4 % (ref 11.5–15.5)
WBC: 9.1 10*3/uL (ref 4.0–10.5)
nRBC: 0 % (ref 0.0–0.2)

## 2023-06-05 LAB — GLUCOSE, CAPILLARY
Glucose-Capillary: 133 mg/dL — ABNORMAL HIGH (ref 70–99)
Glucose-Capillary: 105 mg/dL — ABNORMAL HIGH (ref 70–99)
Glucose-Capillary: 229 mg/dL — ABNORMAL HIGH (ref 70–99)
Glucose-Capillary: 139 mg/dL — ABNORMAL HIGH (ref 70–99)

## 2023-06-05 MED ORDER — SIMETHICONE 80 MG PO CHEW: 80.0000 mg | CHEWABLE_TABLET | Freq: Four times a day (QID) | ORAL | Status: DC | PRN

## 2023-06-05 MED ORDER — HEPARIN SODIUM (PORCINE) 1000 UNIT/ML IJ SOLN
INTRAMUSCULAR | Status: AC
  Administered 2023-06-05: 3200 [IU]
  Filled 2023-06-05: qty 4

## 2023-06-05 NOTE — Progress Notes (Signed)
Received patient in bed.Awake alert and oriented x 4. Consent for treatment verified.  Access used : Right HD catheter that worked well.  Duration of treatment :3.5 hours.  Fluid removed : 500 cc out of prescribed of 1 liter as tolerated.  Hemo comment. Unmet UF goal due to low blood pressures especially on her las hour of her treatment.  Hand off to the patient's nurse.

## 2023-06-05 NOTE — Discharge Summary (Signed)
Physical Therapy Discharge Summary  Patient Details  Name: Samantha [name] MRN: [id number] Date of Birth: April 10, 1951  Date of Discharge from PT service:{Time; dates multiple:304500300}  {CHL IP REHAB PT TIME CALCULATION:304800500}   Patient has met {NUMBERS 0-12:18577} of {NUMBERS 0-12:18577} long term goals due to {due MV:7846962}.  Patient to discharge at an ambulatory level {LOA:3049010} using RW.  Patient's care partner is independent to provide the necessary physical and cognitive assistance at discharge.  Reasons goals not met: ***  Recommendation:  Patient will benefit from ongoing skilled PT services in outpatient setting to continue to advance safe functional mobility, address ongoing impairments in cardiopulmonary endurance, dynamic standing balance, dynamic gait training using LRAD, and minimize fall risk.  Equipment: 16x16 ultra-hemi height wheelchair, pt has all other necessary DME  Reasons for discharge: treatment goals met and discharge from hospital  Patient/family agrees with progress made and goals achieved: {Pt/Family agree with progress/goals:3049020}  PT Discharge Precautions/Restrictions Precautions Precautions: Fall;Other (comment) Precaution Comments: denies falls in past 6 months, HD port on R side Restrictions Weight Bearing Restrictions: No Pain   Pain Interference   Vision/Perception     Cognition   Sensation   Motor  Motor Motor: Abnormal postural alignment and control Motor - Discharge Observations: continued generalized weakness and deconditioning  Mobility Bed Mobility Bed Mobility: Supine to Sit;Sit to Supine Supine to Sit: Independent with assistive device Sit to Supine: Independent with assistive device Transfers Transfers: Sit to Stand;Stand to Sit;Stand Pivot Transfers Sit to Stand: Supervision/Verbal cueing;Independent with assistive device Stand to Sit: Supervision/Verbal cueing;Independent with assistive device Stand  Pivot Transfers: Supervision/Verbal cueing;Independent with assistive device Transfer (Assistive device): Rolling walker Locomotion     Trunk/Postural Assessment  Cervical Assessment Cervical Assessment: Exceptions to [facility] (mild forward head) Thoracic Assessment Thoracic Assessment: Exceptions to [facility] (mild rounded shoulders) Lumbar Assessment Lumbar Assessment: Exceptions to [facility] (mild posterior pelvic tilt) Postural Control Postural Control: Within Functional Limits (using RW for basic functional mobility tasks)  Balance   Extremity Assessment            Carly M Pippin 06/05/2023, 6:51 PM

## 2023-06-05 NOTE — Progress Notes (Signed)
[facility] NEPHROLOGY PROGRESS NOTE  Assessment/ Plan: Pt is a 72 y.o. yo female  with past medical history significant for hypertension, type II DM, PAD, CVA, HLD, CKD 3B who presented with shortness of breath and fluid overload.   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  OP HD was arranged at [facility] in [facility] on TTS 11:40 chair time. Tolerated HD Thur but upset she had to wait awhile for transport. I explained to the pt and daughter who was bedside that that sometimes does happen because of transport but fortunately not often.   In the future PD may be an option but would wait until cholecystitis is better. If not going to pursue PD will need AV access down the line. Would hold off for now as don't want to affect PT which is why she's still here.  Seen on HD  Through RIJ TC 3K bath 105/71 1L net UF  No complaints at this time Cont TTS regimen   # Acute hypoxic respiratory failure due to fluid overload: Received thoracocentesis and diuresis.  Resolved. Now managing volume with HD.   # HTN/volume: BP acceptable.  No edema.  UF as tolerated.  Only on low dose coreg.  I think we have achieved EDW    # Anemia: Hemoglobin at goal-  no treatment needed as of yet but getting close-  iron low, repleted.   # Acute cholecystitis: HIDA scan was positive therefore on antibiotics.  Seen by general surgery and IR.  S/p cholecystostomy tube placement.   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  # Hyponatremia/hypokalemia: Now managing with with dialysis.  Recommend fluid restriction.  Need a higher potassium bath during HD.   Subjective: Seen and examined.  Working with physical therapy.  Reports some dyspepsia.  No chest pain or shortness of breath.  Objective Vital signs in last  24 hours: Vitals:   06/05/23 0541 06/05/23 0837 06/05/23 0900 06/05/23 0930  BP:  112/73  104/71  Pulse:  97 92 100  Resp:  13 13 16   Temp:  97.8 F (36.6 C)    TempSrc:      SpO2:  98% 97% 96%  Weight: 57.5 kg 58 kg    Height:       Weight change: -1.9 kg  Intake/Output Summary (Last 24 hours) at 06/05/2023 0947 Last data filed at 06/05/2023 0737 Gross per 24 hour  Intake 380 ml  Output 125 ml  Net 255 ml       Labs: RENAL PANEL Recent Labs  Lab 05/29/23 1321 06/01/23 1410 06/02/23 1030 06/03/23 1455 06/05/23 0857  NA 124* 122* 126* 126* 127*  K 3.6 3.1* 3.6 3.7 3.4*  CL 86* 85* 89* 88* 81*  CO2 22 23 22 23 26   GLUCOSE 170* 209* 203* 189* 228*  BUN 39* 41* 22 36* 29*  CREATININE 5.26* 5.32* 4.03* 5.81* 5.72*  CALCIUM 8.8* 8.6* 9.0 9.2 9.5  PHOS 2.8 3.1 3.1 3.9 4.2  ALBUMIN 2.9* 2.7* 2.9* 3.0* 3.5    Liver Function Tests: Recent Labs  Lab 06/02/23 1030 06/03/23 1455 06/05/23 0857  ALBUMIN 2.9* 3.0* 3.5   No results for input(s): "LIPASE", "AMYLASE" in the last 168 hours. No results for input(s): "AMMONIA" in the last 168 hours. CBC: Recent Labs  05/25/23 0218 05/27/23 0443 05/28/23 1437 05/29/23 1321 06/01/23 1410 06/03/23 1445 06/05/23 0856  HGB 10.0*   < > 10.2* 11.5* 9.8* 10.6* 10.8*  MCV 89.6   < > 91.9 90.9 90.4 90.3 90.7  FERRITIN 632*  --   --   --   --   --   --   TIBC 172*  --   --   --   --   --   --   IRON 13*  --   --   --   --   --   --    < > = values in this interval not displayed.    Cardiac Enzymes: No results for input(s): "CKTOTAL", "CKMB", "CKMBINDEX", "TROPONINI" in the last 168 hours. CBG: Recent Labs  Lab 06/03/23 2101 06/04/23 0529 06/04/23 1137 06/04/23 2120 06/05/23 0544  GLUCAP 103* 126* 195* 113* 133*    Iron Studies: No results for input(s): "IRON", "TIBC", "TRANSFERRIN", "FERRITIN" in the last 72 hours. Studies/Results: No results found.  Medications: Infusions:  sodium chloride      Scheduled  Medications:  atorvastatin  80 mg Oral Daily   carvedilol  6.25 mg Oral BID WC   Chlorhexidine Gluconate Cloth  6 each Topical Q0600   Chlorhexidine Gluconate Cloth  6 each Topical Q12H   Chlorhexidine Gluconate Cloth  6 each Topical Q0600   Chlorhexidine Gluconate Cloth  6 each Topical Q0600   clopidogrel  75 mg Oral Daily   docusate sodium  100 mg Oral BID   feeding supplement (NEPRO CARB STEADY)  237 mL Oral BID BM   guaiFENesin  600 mg Oral BID   heparin injection (subcutaneous)  5,000 Units Subcutaneous Q8H   insulin aspart  0-15 Units Subcutaneous TID WC   insulin aspart  0-5 Units Subcutaneous QHS   insulin aspart  3 Units Subcutaneous TID WC   insulin glargine-yfgn  15 Units Subcutaneous Daily   polyethylene glycol  17 g Oral BID   sevelamer carbonate  800 mg Oral TID WC    have reviewed scheduled and prn medications.  Physical Exam: General:NAD, comfortable Heart:RRR, s1s2 nl Lungs:clear b/l, no crackle Abdomen:soft, cholecystostomy drain in place.  Nontender Extremities:No edema Dialysis Access: TDC.  Samantha [name] 06/05/2023,9:47 AM  LOS: 8 days

## 2023-06-05 NOTE — Progress Notes (Signed)
PROGRESS NOTE  Subjective/Complaints:  Feeling alright today, just returning from dialysis. Reports nausea improved after meds, not nauseated right now. No vomiting today. Slept poorly but refuses to take anything to help sleep, states she'd "rather not sleep". LBM this morning. Urinated a little this morning, but notes that she doesn't make much. Denies pain currently, states sometimes she has back pain but none right now. Denies any other complaints or concerns.   ROS: as per HPI. Denies CP, SOB, abd pain, diarrhea, constipation, or any other complaints at this time.    Objective:   No results found. Recent Labs    06/03/23 1445 06/05/23 0856  WBC 8.2 9.1  HGB 10.6* 10.8*  HCT 30.6* 31.3*  PLT 359 350   Recent Labs    06/03/23 1455 06/05/23 0857  NA 126* 127*  K 3.7 3.4*  CL 88* 81*  CO2 23 26  GLUCOSE 189* 228*  BUN 36* 29*  CREATININE 5.81* 5.72*  CALCIUM 9.2 9.5    Intake/Output Summary (Last 24 hours) at 06/05/2023 1331 Last data filed at 06/05/2023 1300 Gross per 24 hour  Intake 604 ml  Output 675 ml  Net -71 ml        Physical Exam: Vital Signs Blood pressure 114/67, pulse 94, temperature 97.7 F (36.5 C), resp. rate 14, height 5\' 5"  (1.651 m), weight 57.5 kg, SpO2 98 %.   General: No acute distress, fatigued appearing Mood and affect are appropriate, perhaps a little subdued today Heart: Regular rate and rhythm no rubs murmurs or extra sounds Lungs: Clear to auscultation, breathing unlabored, no rales or wheezes Abdomen: Positive bowel sounds, without any TTP today, nondistended Extremities: No clubbing, cyanosis, or edema Skin: No evidence of breakdown, no evidence of rash, chole tube site CDI no erythema or induration , mild tenderness , sutures intact--not reassessed today Musculoskeletal: Full range of motion in all 4 extremities. No joint swelling   prior exam  Lines-  + RUQ drain with  green tinged clear fluid output; sutured in; nontender, no erythema.   + Right  IJ TDC dressing dry and intact without tenderness   Neurologic exam:  Cognition: AAO to person; not place or time; able to get city, month, and year Language: Difficult to assess d/t language barrier. Names 2/3 objects correctly.  Memory: Recalls 0/3 objects at 5 minutes. Insight: Good insight into current condition.  Mood: Appropriate mood and affect.  Sensation: To light touch intact in BL UEs and LEs  Reflexes: 2+ in BL UE and LEs. Negative Hoffman's and babinski signs bilaterally.  CN: 2-12 grossly intact.  Coordination: Bilateral upper extremity ataxia - mild. Spasticity: MAS 0 in all extremities  Assessment/Plan: 1. Functional deficits which require 3+ hours per day of interdisciplinary therapy in a comprehensive inpatient rehab setting. Physiatrist is providing close team supervision and 24 hour management of active medical problems listed below. Physiatrist and rehab team continue to assess barriers to discharge/monitor patient progress toward functional and medical goals  Care Tool:  Bathing    Body parts bathed by patient: Right arm, Left arm, Chest, Abdomen, Front perineal area, Buttocks, Right upper leg, Left upper leg, Right lower leg, Left  lower leg, Face         Bathing assist Assist Level: Minimal Assistance - Patient > 75%     Upper Body Dressing/Undressing Upper body dressing   What is the patient wearing?: Pull over shirt    Upper body assist Assist Level: Contact Guard/Touching assist    Lower Body Dressing/Undressing Lower body dressing      What is the patient wearing?: Pants     Lower body assist Assist for lower body dressing: Contact Guard/Touching assist     Toileting Toileting    Toileting assist Assist for toileting: Contact Guard/Touching assist     Transfers Chair/bed transfer  Transfers assist     Chair/bed transfer assist level: Contact  Guard/Touching assist Chair/bed transfer assistive device: Geologist, engineering   Ambulation assist      Assist level: Contact Guard/Touching assist Assistive device: Walker-rolling Max distance: 100   Walk 10 feet activity   Assist     Assist level: Supervision/Verbal cueing Assistive device: Walker-rolling   Walk 50 feet activity   Assist    Assist level: Contact Guard/Touching assist Assistive device: Walker-rolling    Walk 150 feet activity   Assist Walk 150 feet activity did not occur: Safety/medical concerns         Walk 10 feet on uneven surface  activity   Assist Walk 10 feet on uneven surfaces activity did not occur: Safety/medical concerns   Assist level: Contact Guard/Touching assist Assistive device: Walker-rolling   Wheelchair     Assist Is the patient using a wheelchair?: Yes (for transport and energy conservation) Type of Wheelchair: Manual    Wheelchair assist level: Dependent - Patient 0%      Wheelchair 50 feet with 2 turns activity    Assist        Assist Level: Dependent - Patient 0%   Wheelchair 150 feet activity     Assist      Assist Level: Dependent - Patient 0%   Blood pressure 114/67, pulse 94, temperature 97.7 F (36.5 C), resp. rate 14, height 5\' 5"  (1.651 m), weight 57.5 kg, SpO2 98 %.   Medical Problem List and Plan:  1. Functional deficits secondary to metabolic encephalopathy due to cholecystitis             - patient may  shower             - ELOS/Goals: 6/10 Mod I PT/OT - Of note, workup did show MRI brain w/ punctate acute infarct-not clear if this is artifactual or a incidental finding; neurology deemed presentation more consistent with metabolic encephalopathy and gave no further recommendations. -Continue CIR  2.  Antithrombotics: -DVT/anticoagulation:  Pharmaceutical: Heparin 5000U Q8H             -antiplatelet therapy: Plavix 75mg  QD   3. Pain Management: Tylenol and  robaxin as needed   4. Mood/Behavior/Sleep: LCSW to evaluate and provide emotional support             -antipsychotic agents: n/a   -Melatonin and Trazodone PRN -06/05/23 still not sleeping well but still doesn't want to schedule meds, advised of PRNs  5. Neuropsych/cognition: This patient is capable of making decisions on her own behalf.   6. Skin/Wound Care: Routine skin care checks; pressure relief measures             -routine drain care   7. Fluids/Electrolytes/Nutrition: Strict Is and Os and follow-up chemistries per nephrology             -  renal/carb modified diet             -continue protein supplementation, Renvela TID             -1200 cc FR -06/05/23 labs mostly at baseline, mildly hypokalemic, managed per nephro, appreciate assistance.    8: DM-2, A1c = 7.7%             -CGBs QID and carb mod diet, SSI             -continue Semglee 15 units daily             -continue Novolog 3 units TID WC  -05/30/23 CBGs much better today, cont to monitor trend elevated this am will increase semglee to 20U -06/05/23 CBGs improving but semglee still at 15U, monitor for now, defer changes to primary team since [facility] was not changed CBG (last 3)  Recent Labs    06/04/23 1137 06/04/23 2120 06/05/23 0544  GLUCAP 195* 113* 133*      9: Hyperlipidemia: continue Atorvastatin 80mg  QD   10: Hyponatremia: management per nephrology;should be able to address in HD    Latest Ref Rng & Units 06/05/2023    8:57 AM 06/03/2023    2:55 PM 06/02/2023   10:30 AM  BMP  Glucose 70 - 99 mg/dL 161  096  045   BUN 8 - 23 mg/dL 29  36  22   Creatinine 0.44 - 1.00 mg/dL 4.09  8.11  9.14   Sodium 135 - 145 mmol/L 127  126  126   Potassium 3.5 - 5.1 mmol/L 3.4  3.7  3.6   Chloride 98 - 111 mmol/L 81  88  89   CO2 22 - 32 mmol/L 26  23  22    Calcium 8.9 - 10.3 mg/dL 9.5  9.2  9.0       11: Acute on chronic diastolic CHF, pulmonary edema: EF 55-60%             -strict Is and Os and daily weight              -1200 cc FR             -continue Coreg 6.25mg  BID  -06/05/23 wt stabilizing Filed Weights   06/05/23 0541 06/05/23 0837 06/05/23 1253  Weight: 57.5 kg 58 kg 57.5 kg      12: Hypertension: continue carvedilol 6.25 mg BID -06/05/23 overall stable, mild hypotension at times during dialysis but stable otherwise  Vitals:   06/05/23 0930 06/05/23 1000 06/05/23 1031 06/05/23 1104  BP: 104/71 103/70 90/69 99/66    06/05/23 1107 06/05/23 1118 06/05/23 1133 06/05/23 1145  BP: 95/65 97/62 90/61  110/70   06/05/23 1200 06/05/23 1213 06/05/23 1232 06/05/23 1235  BP: (!) 84/57 (!) 92/59 (!) 92/59 114/67      13: Iron deficiency: continue Ferrlecit through 6/02-- completed   14: ESRD on HD via right IJ TDC on 5/20             -HD on T/T/S schedule             -has been accepted at [facility] in [facility] on TTS 11:40    -Appreciate nephrology assistance   15: Anemia: continue IV iron and follow-up CBC on routine labs  -06/05/23 H/H stable, monitor routine labs   16: Cholelithiasis/cholecystitis s/p cholecystostomy tube -leukocytosis resolved, continue Augmentin through 6/01-- had one dose 05/28/23 and then d/c'd, per pharmacy and d/c summary, end date 5/31 -will  need follow up in drain clinic and follow up with Dr. Sheliah Hatch after that to discuss whether she is a candidate for laparoscopic cholecystectomy - Daily flushes 5-10 cc    17: History of CAD: continue Plavix, statin, BB   18: Constipation possible contributing to nausea and lack of appetite             -continue Colace 100mg  BID and Miralax 17g BID; Dulcolax supp prn  -Continue Zofran 4mg  q6h PRN for nausea  -06/05/23 LBM this morning, monitor for now    LOS: 8 days A FACE TO FACE EVALUATION WAS PERFORMED  [address] 06/05/2023, 1:31 PM

## 2023-06-06 DIAGNOSIS — I951 Orthostatic hypotension: Secondary | ICD-10-CM

## 2023-06-06 LAB — GLUCOSE, CAPILLARY
Glucose-Capillary: 158 mg/dL — ABNORMAL HIGH (ref 70–99)
Glucose-Capillary: 143 mg/dL — ABNORMAL HIGH (ref 70–99)
Glucose-Capillary: 153 mg/dL — ABNORMAL HIGH (ref 70–99)
Glucose-Capillary: 84 mg/dL (ref 70–99)

## 2023-06-06 MED ORDER — DOCUSATE SODIUM 100 MG PO CAPS: 100.0000 mg | ORAL_CAPSULE | Freq: Two times a day (BID) | ORAL | Status: DC | PRN

## 2023-06-06 MED ORDER — POLYETHYLENE GLYCOL 3350 17 G PO PACK
17.0000 g | PACK | Freq: Every day | ORAL | Status: DC
  Filled 2023-06-06: qty 1

## 2023-06-06 MED ORDER — POLYVINYL ALCOHOL 1.4 % OP SOLN
1.0000 [drp] | OPHTHALMIC | Status: DC | PRN
  Administered 2023-06-06 – 2023-06-07 (×2): 1 [drp] via OPHTHALMIC
  Filled 2023-06-06: qty 15

## 2023-06-06 NOTE — Progress Notes (Signed)
Occupational Therapy Session Note  Patient Details  Name: Samantha [name] MRN: [id number] Date of Birth: Apr 13, 1951  Today's Date: 06/06/2023 OT Individual Time: 4098-1191 OT Individual Time Calculation (min): 58 min    Short Term Goals: Week 1:  OT Short Term Goal 1 (Week 1): STG=LTG d.t ELOS   Skilled Therapeutic Interventions/Progress Updates:    Pt bed level at time of session, no pain reported. Interpreter Sonia present throughout session. Pt performing bed mobility without assist, ambulating to/from toilet with Supervision and simulating toilet tasks with Supervision as well with RW for support. UB dress set up, LB dress Supervision. Note pt c/o lightheadedness at this time and BP 102/66 with symptoms resolving with rest. Note discussion with pt regarding grad day and pt stating she is not going home tomorrow, recommended speaking with SW/nursing tomorrow. Pt transported via wheelchair to/from ADL room and performing TTB transfer after demonstration with Supervision overall. Note pt performing all tasks with Supervision for transfers and ADL tasks given dizziness and supervision provided for safety. Set up at end of session bed level alarm on call bell in reach.    Therapy Documentation Precautions:  Precautions Precautions: Fall, Other (comment) Precaution Comments: denies falls in past 6 months, HD port on R side Restrictions Weight Bearing Restrictions: No     Therapy/Group: Individual Therapy  Erasmo Score 06/06/2023, 7:44 AM

## 2023-06-06 NOTE — Progress Notes (Signed)
Occupational Therapy Discharge Summary  Patient Details  Name: Samantha [name] MRN: [id number] Date of Birth: 05/21/1951  Date of Discharge from OT service:June 06, 2023   Patient has met 8 of 8 long term goals due to improved activity tolerance, improved balance, postural control, ability to compensate for deficits, improved awareness, and improved coordination.  Patient to discharge at overall Supervision level.  Patient's care partner is independent to provide the necessary physical assistance at discharge.  Pt is overall Supervision for safety given dizziness during ADL tasks. Pt reports having daughter/family to assist at time of DC.  Reasons goals not met: All goals met  Recommendation:  Patient will benefit from ongoing skilled OT services in home health setting to continue to advance functional skills in the area of BADL and Reduce care partner burden.  Equipment: No equipment provided In the future may need TTB once cleared for showers  Reasons for discharge: discharge from hospital  Patient/family agrees with progress made and goals achieved: Yes  OT Discharge Precautions/Restrictions  Precautions Precautions: Fall;Other (comment) Precaution Comments: HD port on R side Restrictions Weight Bearing Restrictions: No Pain Pain Assessment Pain Scale: 0-10 Pain Score: 0-No pain Faces Pain Scale: No hurt ADL ADL Eating: Set up Grooming: Modified independent Where Assessed-Grooming: Sitting at sink Upper Body Bathing: Setup Where Assessed-Upper Body Bathing: Sitting at sink Lower Body Bathing: Supervision/safety Where Assessed-Lower Body Bathing: Sitting at sink, Standing at sink Upper Body Dressing: Setup Where Assessed-Upper Body Dressing: Sitting at sink Lower Body Dressing: Supervision/safety Where Assessed-Lower Body Dressing: Sitting at sink Toileting: Supervision/safety Where Assessed-Toileting: Teacher, adult education: Close supervision Toilet Transfer  Method: Insurance claims handler: Close supervison Vision Baseline Vision/History: 0 No visual deficits Patient Visual Report: Peripheral vision impairment Perception  Perception: Within Functional Limits Praxis Praxis: Intact Cognition Cognition Overall Cognitive Status: History of cognitive impairments - at baseline Arousal/Alertness: Awake/alert Orientation Level: Person;Place;Situation Person: Oriented Place: Oriented Situation: Oriented Awareness: Impaired Brief Interview for Mental Status (BIMS) Repetition of Three Words (First Attempt): 3 Temporal Orientation: Year: Correct Temporal Orientation: Month: Accurate within 5 days Temporal Orientation: Day: Correct Recall: "Sock": Yes, no cue required Recall: "Blue": Yes, no cue required Recall: "Bed": Yes, no cue required BIMS Summary Score: 15 Sensation Sensation Light Touch: Appears Intact Coordination Gross Motor Movements are Fluid and Coordinated: Yes Fine Motor Movements are Fluid and Coordinated: Yes Motor  Motor Motor: Abnormal postural alignment and control Motor - Skilled Clinical Observations: generalized  weakness and deconditioning Motor - Discharge Observations: continued generalized weakness and deconditioning Mobility  Bed Mobility Bed Mobility: Supine to Sit;Sit to Supine Supine to Sit: Independent with assistive device Sit to Supine: Independent with assistive device Transfers Sit to Stand: Supervision/Verbal cueing Stand to Sit: Supervision/Verbal cueing  Trunk/Postural Assessment  Cervical Assessment Cervical Assessment: Exceptions to [facility] Thoracic Assessment Thoracic Assessment: Exceptions to [facility] Lumbar Assessment Lumbar Assessment: Exceptions to [facility] Postural Control Postural Control: Within Functional Limits  Balance Balance Balance Assessed: Yes Static Sitting Balance Static Sitting - Balance Support: Feet supported Static Sitting - Level of Assistance: 7: Independent Dynamic  Sitting Balance Dynamic Sitting - Balance Support: Feet supported Dynamic Sitting - Level of Assistance: 6: Modified independent (Device/Increase time) Static Standing Balance Static Standing - Balance Support: During functional activity Static Standing - Level of Assistance: 6: Modified independent (Device/Increase time) Dynamic Standing Balance Dynamic Standing - Balance Support: During functional activity Dynamic Standing - Level of Assistance: 5: Stand by assistance Dynamic Standing - Balance Activities: Lateral lean/weight shifting;Forward lean/weight shifting  Extremity/Trunk Assessment RUE Assessment RUE Assessment: Within Functional Limits General Strength Comments: generalized weakness LUE Assessment LUE Assessment: Within Functional Limits General Strength Comments: generalized weakness   Erasmo Score; [place] [employer], OTR/L 06/06/2023, 10:15 AM

## 2023-06-06 NOTE — Progress Notes (Signed)
[facility] NEPHROLOGY PROGRESS NOTE  Assessment/ Plan: Pt is a 72 y.o. yo female  with past medical history significant for hypertension, type II DM, PAD, CVA, HLD, CKD 3B who presented with shortness of breath and fluid overload.   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  OP HD was arranged at [facility] in [facility] on TTS 11:40 chair time.   In the future PD may be an option but would wait until cholecystitis is better. If not going to pursue PD will need AV access down the line. Would hold off for now as don't want to affect PT which is why she's still here.  Tolerated HD on 6/8 but UF limited to out of the 1L ordered bec of hypotension. Likely at EDW now.  Cont TTS regimen, patient appears comfortable.   # Acute hypoxic respiratory failure due to fluid overload: Received thoracocentesis and diuresis.  Resolved. Now managing volume with HD.   # HTN/volume: BP acceptable.  No edema.  UF as tolerated.  Only on low dose coreg.  I think we have achieved EDW    # Anemia: Hemoglobin at goal-  no treatment needed as of yet but getting close-  iron low, repleted.   # Acute cholecystitis: HIDA scan was positive therefore on antibiotics.  Seen by general surgery and IR.  S/p cholecystostomy tube placement.   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  # Hyponatremia/hypokalemia: Now managing with with dialysis.  Recommend fluid restriction.  Need a higher potassium bath during HD.   Subjective: Seen and examined.  Didn't sleep great last night but appetite improving. Denies chest pain or shortness of breath. Hypotension latter half of dialysis yesterday, only able to get UF/ 1L requested.  Objective Vital signs in last 24 hours: Vitals:   06/05/23 1343 06/05/23 1348  06/05/23 2013 06/06/23 0543  BP: 107/67 124/81 110/63 111/64  Pulse: 99 (!) 103 98 89  Resp: 14 14 18 15   Temp: 97.7 F (36.5 C) 97.7 F (36.5 C) 98.3 F (36.8 C) 98 F (36.7 C)  TempSrc:      SpO2: 100% 100% 98% 99%  Weight:    57.7 kg  Height:       Weight change: 0.5 kg  Intake/Output Summary (Last 24 hours) at 06/06/2023 0716 Last data filed at 06/06/2023 0500 Gross per 24 hour  Intake 724 ml  Output 870 ml  Net -146 ml       Labs: RENAL PANEL Recent Labs  Lab 06/01/23 1410 06/02/23 1030 06/03/23 1455 06/05/23 0857  NA 122* 126* 126* 127*  K 3.1* 3.6 3.7 3.4*  CL 85* 89* 88* 81*  CO2 23 22 23 26   GLUCOSE 209* 203* 189* 228*  BUN 41* 22 36* 29*  CREATININE 5.32* 4.03* 5.81* 5.72*  CALCIUM 8.6* 9.0 9.2 9.5  PHOS 3.1 3.1 3.9 4.2  ALBUMIN 2.7* 2.9* 3.0* 3.5    Liver Function Tests: Recent Labs  Lab 06/02/23 1030 06/03/23 1455 06/05/23 0857  ALBUMIN 2.9* 3.0* 3.5   No results for input(s): "LIPASE", "AMYLASE" in the last 168 hours. No results for input(s): "AMMONIA" in the last 168 hours. CBC: Recent Labs    05/25/23 0218 05/27/23 0443 05/28/23 1437 05/29/23 1321 06/01/23 1410 06/03/23 1445 06/05/23 0856  HGB 10.0*   < >  10.2* 11.5* 9.8* 10.6* 10.8*  MCV 89.6   < > 91.9 90.9 90.4 90.3 90.7  FERRITIN 632*  --   --   --   --   --   --   TIBC 172*  --   --   --   --   --   --   IRON 13*  --   --   --   --   --   --    < > = values in this interval not displayed.    Cardiac Enzymes: No results for input(s): "CKTOTAL", "CKMB", "CKMBINDEX", "TROPONINI" in the last 168 hours. CBG: Recent Labs  Lab 06/05/23 0544 06/05/23 1350 06/05/23 1649 06/05/23 2051 06/06/23 0549  GLUCAP 133* 105* 229* 139* 158*    Iron Studies: No results for input(s): "IRON", "TIBC", "TRANSFERRIN", "FERRITIN" in the last 72 hours. Studies/Results: No results found.  Medications: Infusions:  sodium chloride      Scheduled Medications:  atorvastatin  80 mg Oral  Daily   carvedilol  6.25 mg Oral BID WC   Chlorhexidine Gluconate Cloth  6 each Topical Q0600   Chlorhexidine Gluconate Cloth  6 each Topical Q12H   Chlorhexidine Gluconate Cloth  6 each Topical Q0600   Chlorhexidine Gluconate Cloth  6 each Topical Q0600   clopidogrel  75 mg Oral Daily   docusate sodium  100 mg Oral BID   feeding supplement (NEPRO CARB STEADY)  237 mL Oral BID BM   guaiFENesin  600 mg Oral BID   heparin injection (subcutaneous)  5,000 Units Subcutaneous Q8H   insulin aspart  0-15 Units Subcutaneous TID WC   insulin aspart  0-5 Units Subcutaneous QHS   insulin aspart  3 Units Subcutaneous TID WC   insulin glargine-yfgn  15 Units Subcutaneous Daily   polyethylene glycol  17 g Oral BID   sevelamer carbonate  800 mg Oral TID WC    have reviewed scheduled and prn medications.  Physical Exam: General:NAD, comfortable Heart:RRR, s1s2 nl Lungs:clear b/l, no crackle Abdomen:soft, NDNT +BS Extremities:No edema Dialysis Access: TDC.  Julio Zappia W 06/06/2023,7:16 AM  LOS: 9 days

## 2023-06-06 NOTE — Progress Notes (Signed)
Physical Therapy Session Note  Patient Details  Name: Samantha [name] MRN: [id number] Date of Birth: [birth year]  Today's Date: 06/06/2023 PT Individual Time: 1000-1055 PT Individual Time Calculation (min): 55 min   Short Term Goals: Week 1:  PT Short Term Goal 1 (Week 1): = to LTGs based on ELOS  Skilled Therapeutic Interventions/Progress Updates:       Pt in bed to start with Sonia, the interpeter, present at the bedside and throughout session to assist with communication. Pt denies pain but reports feeling weak and tired from Dialysis yesterday.   Supine<>sitting EOB without assist, HOB only slightly elevated. Donned shoes with totalA for time management. Sit<>stand to RW mod I level and completes ambulatory transfer with supervision and RW to her w/c. Patient transported to main rehab gym for energy conservation.  Sit<>stand to RW mod I From w/c level - ambulates 165ft + 173ft with supervision and RW before patient becomes fatigued and requests to sit down to rest. Requires several minute seated rest break to recovery - reports having a headache and also a little lightheaded.   BP 104/76 sitting.  BP 84/63 standing.  *RN and PA notified via secure chat.   Pt requesting to return to her room to rest due to feeling so fatigued. Once in;room  -  pt asking to use   toilet to have a BM.   Ambulatory transfer with supervision and RW to the toilet. Encouraged her to avoid straining. Pt  continent x2 and able to complete posterior pericare without assistance. Ambulated back to bed with supervision and RW with bed mobility completed mod I. All needs met at end.     Therapy Documentation Precautions:  Precautions Precautions: Fall, Other (comment) Precaution Comments: denies falls in past 6 months, HD port on R side Restrictions Weight Bearing Restrictions: No General:   Therapy/Group: Individual Therapy  Orrin Brigham 06/06/2023, 7:31 AM

## 2023-06-06 NOTE — Progress Notes (Signed)
Slept good. Obtained standing weight this morning. After weight, patient complained of nausea, PRN zofran given at 0604. Decreased appetite. Denies pain. Alfredo Martinez A

## 2023-06-06 NOTE — Progress Notes (Signed)
PROGRESS NOTE  Subjective/Complaints:  Feeling ok today, about the same as usual after dialysis. Reports nausea this morning improved after meds, not nauseated right now. No vomiting today. Slept poorly again but still refuses to take anything to help sleep, continues to state she'd "rather not sleep" than take meds. LBM this morning. Urinated a little this morning. Denies pain today. Denies any other complaints or concerns.   ROS: as per HPI. Denies CP, SOB, abd pain, vomiting, diarrhea, constipation, or any other complaints at this time.    Objective:   No results found. Recent Labs    06/03/23 1445 06/05/23 0856  WBC 8.2 9.1  HGB 10.6* 10.8*  HCT 30.6* 31.3*  PLT 359 350   Recent Labs    06/03/23 1455 06/05/23 0857  NA 126* 127*  K 3.7 3.4*  CL 88* 81*  CO2 23 26  GLUCOSE 189* 228*  BUN 36* 29*  CREATININE 5.81* 5.72*  CALCIUM 9.2 9.5    Intake/Output Summary (Last 24 hours) at 06/06/2023 1127 Last data filed at 06/06/2023 0816 Gross per 24 hour  Intake 734 ml  Output 870 ml  Net -136 ml        Physical Exam: Vital Signs Blood pressure 111/64, pulse 89, temperature 98 F (36.7 C), resp. rate 15, height 5\' 5"  (1.651 m), weight 57.7 kg, SpO2 99 %.   General: No acute distress, fatigued appearing Mood and affect are appropriate Heart: Regular rate and rhythm no rubs murmurs or extra sounds Lungs: Clear to auscultation, breathing unlabored, no rales or wheezes Abdomen: Positive bowel sounds, without any TTP today, nondistended Extremities: No clubbing, cyanosis, or edema Skin: No evidence of breakdown, no evidence of rash, chole tube site CDI no erythema or induration , mild tenderness , sutures intact--not reassessed today Musculoskeletal: Full range of motion in all 4 extremities. No joint swelling   prior exam  Lines-  + RUQ drain with green tinged clear fluid output; sutured in; nontender, no  erythema.   + Right  IJ TDC dressing dry and intact without tenderness   Neurologic exam:  Cognition: AAO to person; not place or time; able to get city, month, and year Language: Difficult to assess d/t language barrier. Names 2/3 objects correctly.  Memory: Recalls 0/3 objects at 5 minutes. Insight: Good insight into current condition.  Mood: Appropriate mood and affect.  Sensation: To light touch intact in BL UEs and LEs  Reflexes: 2+ in BL UE and LEs. Negative Hoffman's and babinski signs bilaterally.  CN: 2-12 grossly intact.  Coordination: Bilateral upper extremity ataxia - mild. Spasticity: MAS 0 in all extremities  Assessment/Plan: 1. Functional deficits which require 3+ hours per day of interdisciplinary therapy in a comprehensive inpatient rehab setting. Physiatrist is providing close team supervision and 24 hour management of active medical problems listed below. Physiatrist and rehab team continue to assess barriers to discharge/monitor patient progress toward functional and medical goals  Care Tool:  Bathing    Body parts bathed by patient: Right arm, Left arm, Chest, Abdomen, Front perineal area, Buttocks, Right upper leg, Left upper leg, Right lower leg, Left lower leg, Face  Bathing assist Assist Level: Supervision/Verbal cueing     Upper Body Dressing/Undressing Upper body dressing   What is the patient wearing?: Pull over shirt    Upper body assist Assist Level: Set up assist    Lower Body Dressing/Undressing Lower body dressing      What is the patient wearing?: Pants     Lower body assist Assist for lower body dressing: Supervision/Verbal cueing     Toileting Toileting    Toileting assist Assist for toileting: Supervision/Verbal cueing     Transfers Chair/bed transfer  Transfers assist     Chair/bed transfer assist level: Independent with assistive device Chair/bed transfer assistive device: Walker, Recruitment consultant   Ambulation assist      Assist level: Supervision/Verbal cueing Assistive device: Walker-rolling Max distance: 120'   Walk 10 feet activity   Assist     Assist level: Supervision/Verbal cueing Assistive device: Walker-rolling   Walk 50 feet activity   Assist    Assist level: Supervision/Verbal cueing Assistive device: Walker-rolling    Walk 150 feet activity   Assist Walk 150 feet activity did not occur: Safety/medical concerns (fatigue)         Walk 10 feet on uneven surface  activity   Assist Walk 10 feet on uneven surfaces activity did not occur: Safety/medical concerns   Assist level: Contact Guard/Touching assist Assistive device: Walker-rolling   Wheelchair     Assist Is the patient using a wheelchair?: Yes (for transport and energy conservation) Type of Wheelchair: Manual    Wheelchair assist level: Dependent - Patient 0%      Wheelchair 50 feet with 2 turns activity    Assist        Assist Level: Dependent - Patient 0%   Wheelchair 150 feet activity     Assist      Assist Level: Dependent - Patient 0%   Blood pressure 111/64, pulse 89, temperature 98 F (36.7 C), resp. rate 15, height 5\' 5"  (1.651 m), weight 57.7 kg, SpO2 99 %.   Medical Problem List and Plan:  1. Functional deficits secondary to metabolic encephalopathy due to cholecystitis             - patient may  shower             - ELOS/Goals: 6/10 Mod I PT/OT - Of note, workup did show MRI brain w/ punctate acute infarct-not clear if this is artifactual or a incidental finding; neurology deemed presentation more consistent with metabolic encephalopathy and gave no further recommendations. -Continue CIR  2.  Antithrombotics: -DVT/anticoagulation:  Pharmaceutical: Heparin 5000U Q8H             -antiplatelet therapy: Plavix 75mg  QD   3. Pain Management: Tylenol and robaxin as needed   4. Mood/Behavior/Sleep: LCSW to evaluate and  provide emotional support             -antipsychotic agents: n/a   -Melatonin and Trazodone PRN -6/8-9/24 still not sleeping well but still doesn't want to schedule meds, advised of PRNs  5. Neuropsych/cognition: This patient is capable of making decisions on her own behalf.   6. Skin/Wound Care: Routine skin care checks; pressure relief measures             -routine drain care   7. Fluids/Electrolytes/Nutrition: Strict Is and Os and follow-up chemistries per nephrology             -renal/carb modified diet             -  continue protein supplementation, Renvela TID             -1200 cc FR -06/05/23 labs mostly at baseline, mildly hypokalemic, managed per nephro, appreciate assistance.    8: DM-2, A1c = 7.7%             -CGBs QID and carb mod diet, SSI             -continue Semglee 15 units daily             -continue Novolog 3 units TID WC  -05/30/23 CBGs much better today, cont to monitor trend elevated this am will increase semglee to 20U -6/8-9/24 CBGs improving but semglee still at 15U, monitor for now, defer changes to primary team since [facility] was not changed CBG (last 3)  Recent Labs    06/05/23 2051 06/06/23 0549 06/06/23 1118  GLUCAP 139* 158* 143*      9: Hyperlipidemia: continue Atorvastatin 80mg  QD   10: Hyponatremia: management per nephrology;should be able to address in HD    Latest Ref Rng & Units 06/05/2023    8:57 AM 06/03/2023    2:55 PM 06/02/2023   10:30 AM  BMP  Glucose 70 - 99 mg/dL 161  096  045   BUN 8 - 23 mg/dL 29  36  22   Creatinine 0.44 - 1.00 mg/dL 4.09  8.11  9.14   Sodium 135 - 145 mmol/L 127  126  126   Potassium 3.5 - 5.1 mmol/L 3.4  3.7  3.6   Chloride 98 - 111 mmol/L 81  88  89   CO2 22 - 32 mmol/L 26  23  22    Calcium 8.9 - 10.3 mg/dL 9.5  9.2  9.0       11: Acute on chronic diastolic CHF, pulmonary edema: EF 55-60%             -strict Is and Os and daily weight             -1200 cc FR             -continue Coreg 6.25mg  BID -06/06/23 wt  stabilizing, a little orthostatic in PT today but suspect this is from dialysis yesterday (they removed less than usual because of hypotension)-- TED hose applied, monitor Filed Weights   06/05/23 0837 06/05/23 1253 06/06/23 0543  Weight: 58 kg 57.5 kg 57.7 kg      12: Hypertension: continue carvedilol 6.25 mg BID -06/05/23 overall stable, mild hypotension at times during dialysis but stable otherwise -06/06/23 BPs stable today but a little orthostatic in PT (see above #11), monitor closely  Vitals:   06/05/23 1107 06/05/23 1118 06/05/23 1133 06/05/23 1145  BP: 95/65 97/62 90/61  110/70   06/05/23 1200 06/05/23 1213 06/05/23 1232 06/05/23 1235  BP: (!) 84/57 (!) 92/59 (!) 92/59 114/67   06/05/23 1343 06/05/23 1348 06/05/23 2013 06/06/23 0543  BP: 107/67 124/81 110/63 111/64      13: Iron deficiency: continue Ferrlecit through 6/02-- completed   14: ESRD on HD via right IJ TDC on 5/20             -HD on T/T/S schedule             -has been accepted at [facility] in [facility] on TTS 11:40    -Appreciate nephrology assistance   15: Anemia: continue IV iron and follow-up CBC on routine labs  -06/05/23 H/H stable, monitor routine labs   16: Cholelithiasis/cholecystitis s/p  cholecystostomy tube -leukocytosis resolved, continue Augmentin through 6/01-- had one dose 05/28/23 and then d/c'd, per pharmacy and d/c summary, end date 5/31 -will need follow up in drain clinic and follow up with Dr. Sheliah Hatch after that to discuss whether she is a candidate for laparoscopic cholecystectomy - Daily flushes 5-10 cc    17: History of CAD: continue Plavix, statin, BB   18: Constipation possible contributing to nausea and lack of appetite             -continue Colace 100mg  BID and Miralax 17g BID; Dulcolax supp prn  -Continue Zofran 4mg  q6h PRN for nausea  -6/8-9/24 LBM this morning, monitor for now    LOS: 9 days A FACE TO FACE EVALUATION WAS PERFORMED  [address] 06/06/2023, 11:27 AM

## 2023-06-07 LAB — GLUCOSE, CAPILLARY: Glucose-Capillary: 134 mg/dL — ABNORMAL HIGH (ref 70–99)

## 2023-06-07 MED ORDER — ATORVASTATIN CALCIUM 80 MG PO TABS: 80.0000 mg | ORAL_TABLET | Freq: Every day | ORAL | 0 refills | Status: DC

## 2023-06-07 MED ORDER — INSULIN DETEMIR 100 UNIT/ML FLEXPEN: 15.0000 [IU] | PEN_INJECTOR | Freq: Every day | SUBCUTANEOUS | 0 refills | Status: DC

## 2023-06-07 MED ORDER — CARVEDILOL 6.25 MG PO TABS: 6.2500 mg | ORAL_TABLET | Freq: Two times a day (BID) | ORAL | 0 refills | Status: DC

## 2023-06-07 MED ORDER — CLOPIDOGREL BISULFATE 75 MG PO TABS: 75.0000 mg | ORAL_TABLET | Freq: Every day | ORAL | 0 refills | Status: DC

## 2023-06-07 MED ORDER — ONDANSETRON HCL 4 MG PO TABS: 4.0000 mg | ORAL_TABLET | Freq: Four times a day (QID) | ORAL | 0 refills | Status: AC | PRN

## 2023-06-07 MED ORDER — SEVELAMER CARBONATE 800 MG PO TABS: 800.0000 mg | ORAL_TABLET | Freq: Three times a day (TID) | ORAL | 0 refills | Status: DC

## 2023-06-07 MED ORDER — ACETAMINOPHEN 325 MG PO TABS: 325.0000 mg | ORAL_TABLET | ORAL | Status: AC | PRN

## 2023-06-07 MED ORDER — NEPRO/CARBSTEADY PO LIQD: 237.0000 mL | Freq: Two times a day (BID) | ORAL | 0 refills | Status: DC

## 2023-06-07 NOTE — Progress Notes (Addendum)
D/C order noted. Contacted Carla with [facility] out-pt HD this morning. Carla aware pt to d/c today and start tomorrow at [facility] as planned. Today and yesterday's renal progress notes faxed to [facility] for continuation of care. Will fax d/c summary to [facility] once completed.   Olivia Canter Renal Navigator [phone]  Addendum at 2:43 pm: D/C summary faxed to [facility] with [facility] out-pt HD.

## 2023-06-07 NOTE — Plan of Care (Signed)
  Problem: RH Balance Goal: LTG Patient will maintain dynamic standing with ADLs (OT) Description: LTG:  Patient will maintain dynamic standing balance with assist during activities of daily living (OT)  Outcome: Completed/Met   Problem: RH Dressing Goal: LTG Patient will perform lower body dressing w/assist (OT) Description: LTG: Patient will perform lower body dressing with assist, with/without cues in positioning using equipment (OT) Outcome: Completed/Met   Problem: RH Toileting Goal: LTG Patient will perform toileting task (3/3 steps) with assistance level (OT) Description: LTG: Patient will perform toileting task (3/3 steps) with assistance level (OT)  Outcome: Completed/Met   Problem: RH Toilet Transfers Goal: LTG Patient will perform toilet transfers w/assist (OT) Description: LTG: Patient will perform toilet transfers with assist, with/without cues using equipment (OT) Outcome: Completed/Met

## 2023-06-07 NOTE — Progress Notes (Signed)
Patient ID: Samantha [name], female   DOB: October 26, 1951, 72 y.o.   MRN: [id number] BP 114/67 (BP Location: Right Arm)   Pulse 92   Temp 98.1 F (36.7 C) (Oral)   Resp 18   Ht 5\' 5"  (1.651 m)   Wt 59.1 kg   SpO2 99%   BMI 21.68 kg/m   Patient evaluated with daughter at bedside.  She had some nausea earlier this morning and has just received an antiemetic.  She informs me that she is going home today to begin hemodialysis as an outpatient tomorrow.  [name] MD [facility] Office # [phone] Pager # [phone] 10:32 AM

## 2023-06-07 NOTE — Progress Notes (Addendum)
INPATIENT REHABILITATION DISCHARGE NOTE   Discharge instructions ZO:XWRUEA Setzer PA  Verbalized understanding:yes with interpreter and daughter  Skin care/Wound care healing?none  Pain:none  IV's:HDcath  Tubes/Drains:biliarydrain  O2:none  Safety instructions: done Patient belongings: done Discharged VW:[id number]  Discharged via: car Notes:RN demonstrated to daughter how to flush biliary tube and drain tube. Instructed daughter to record output. Supplies given for 1 week until follow up with MD. Instructed how to do dressing change and careful not to poke bag .  No questions noted.

## 2023-06-07 NOTE — Progress Notes (Signed)
PROGRESS NOTE  Subjective/Complaints:   No issues overnite , loose stools subsided , discussed d/c process with pt and daughter   ROS: as per HPI. Denies CP, SOB, abd pain, vomiting, diarrhea, constipation, or any other complaints at this time.    Objective:   No results found. Recent Labs    06/05/23 0856  WBC 9.1  HGB 10.8*  HCT 31.3*  PLT 350    Recent Labs    06/05/23 0857  NA 127*  K 3.4*  CL 81*  CO2 26  GLUCOSE 228*  BUN 29*  CREATININE 5.72*  CALCIUM 9.5     Intake/Output Summary (Last 24 hours) at 06/07/2023 0945 Last data filed at 06/07/2023 0700 Gross per 24 hour  Intake 698 ml  Output 100 ml  Net 598 ml         Physical Exam: Vital Signs Blood pressure 114/67, pulse 92, temperature 98.1 F (36.7 C), temperature source Oral, resp. rate 18, height 5\' 5"  (1.651 m), weight 59.1 kg, SpO2 99 %.   General: No acute distress, fatigued appearing Mood and affect are appropriate Heart: Regular rate and rhythm no rubs murmurs or extra sounds Lungs: Clear to auscultation, breathing unlabored, no rales or wheezes Abdomen: Positive bowel sounds, without any TTP today, nondistended Extremities: No clubbing, cyanosis, or edema Skin: No evidence of breakdown, no evidence of rash, chole tube site CDI no erythema or induration , mild tenderness ,  Lines-  + RUQ drain with green tinged clear fluid output; sutured in; nontender, no erythema.  Musculoskeletal: Full range of motion in all 4 extremities. No joint swelling       Assessment/Plan: 1. Functional deficits which require 3+ hours per day of interdisciplinary therapy in a comprehensive inpatient rehab setting. Physiatrist is providing close team supervision and 24 hour management of active medical problems listed below. Physiatrist and rehab team continue to assess barriers to discharge/monitor patient progress toward functional and medical  goals  Care Tool:  Bathing    Body parts bathed by patient: Right arm, Left arm, Chest, Abdomen, Front perineal area, Buttocks, Right upper leg, Left upper leg, Right lower leg, Left lower leg, Face         Bathing assist Assist Level: Supervision/Verbal cueing     Upper Body Dressing/Undressing Upper body dressing   What is the patient wearing?: Pull over shirt    Upper body assist Assist Level: Set up assist    Lower Body Dressing/Undressing Lower body dressing      What is the patient wearing?: Pants     Lower body assist Assist for lower body dressing: Supervision/Verbal cueing     Toileting Toileting    Toileting assist Assist for toileting: Supervision/Verbal cueing     Transfers Chair/bed transfer  Transfers assist     Chair/bed transfer assist level: Independent with assistive device Chair/bed transfer assistive device: Walker, Archivist   Ambulation assist      Assist level: Supervision/Verbal cueing Assistive device: Walker-rolling Max distance: 120'   Walk 10 feet activity   Assist     Assist level: Supervision/Verbal cueing Assistive device: Walker-rolling   Walk 50 feet activity  Assist    Assist level: Supervision/Verbal cueing Assistive device: Walker-rolling    Walk 150 feet activity   Assist Walk 150 feet activity did not occur: Safety/medical concerns (fatigue)         Walk 10 feet on uneven surface  activity   Assist Walk 10 feet on uneven surfaces activity did not occur: Safety/medical concerns   Assist level: Contact Guard/Touching assist Assistive device: Walker-rolling   Wheelchair     Assist Is the patient using a wheelchair?: Yes (for transport and energy conservation) Type of Wheelchair: Manual    Wheelchair assist level: Dependent - Patient 0%      Wheelchair 50 feet with 2 turns activity    Assist        Assist Level: Dependent - Patient 0%    Wheelchair 150 feet activity     Assist      Assist Level: Dependent - Patient 0%   Blood pressure 114/67, pulse 92, temperature 98.1 F (36.7 C), temperature source Oral, resp. rate 18, height 5\' 5"  (1.651 m), weight 59.1 kg, SpO2 99 %.   Medical Problem List and Plan:  1. Functional deficits secondary to metabolic encephalopathy due to cholecystitis             - patient may  shower             - ELOS/Goals: 6/10 Mod I PT/OT - Of note, workup did show MRI brain w/ punctate acute infarct-not clear if this is artifactual or a incidental finding; neurology deemed presentation more consistent with metabolic encephalopathy and gave no further recommendations. Stable for d/c  2.  Antithrombotics: -DVT/anticoagulation:  Pharmaceutical: Heparin 5000U Q8H- d/c             -antiplatelet therapy: Plavix 75mg  QD   3. Pain Management: Tylenol and robaxin as needed   4. Mood/Behavior/Sleep: LCSW to evaluate and provide emotional support             -antipsychotic agents: n/a   -Melatonin and Trazodone PRN -6/8-9/24 still not sleeping well but still doesn't want to schedule meds, advised of PRNs  5. Neuropsych/cognition: This patient is capable of making decisions on her own behalf.   6. Skin/Wound Care: Routine skin care checks; pressure relief measures             -routine drain care   7. Fluids/Electrolytes/Nutrition: Strict Is and Os and follow-up chemistries per nephrology             -renal/carb modified diet             -continue protein supplementation, Renvela TID             -1200 cc FR -06/05/23 labs mostly at baseline, mildly hypokalemic, managed per nephro, appreciate assistance.    8: DM-2, A1c = 7.7%             -CGBs QID and carb mod diet, SSI             -continue Semglee 15 units daily             -continue Novolog 3 units TID WC  -06/07/23 cont Semglee 15U qhs  CBG (last 3)  Recent Labs    06/06/23 1637 06/06/23 2033 06/07/23 0539  GLUCAP 153* 84 134*      controlled 6/10  9: Hyperlipidemia: continue Atorvastatin 80mg  QD   10: Hyponatremia: management per nephrology;should be able to address in HD    Latest Ref Rng & Units 06/05/2023  8:57 AM 06/03/2023    2:55 PM 06/02/2023   10:30 AM  BMP  Glucose 70 - 99 mg/dL 161  096  045   BUN 8 - 23 mg/dL 29  36  22   Creatinine 0.44 - 1.00 mg/dL 4.09  8.11  9.14   Sodium 135 - 145 mmol/L 127  126  126   Potassium 3.5 - 5.1 mmol/L 3.4  3.7  3.6   Chloride 98 - 111 mmol/L 81  88  89   CO2 22 - 32 mmol/L 26  23  22    Calcium 8.9 - 10.3 mg/dL 9.5  9.2  9.0       11: Acute on chronic diastolic CHF, pulmonary edema: EF 55-60%             -strict Is and Os and daily weight             -1200 cc FR             -continue Coreg 6.25mg  BID -06/06/23 wt stabilizing, a little orthostatic in PT today but suspect this is from dialysis yesterday (they removed less than usual because of hypotension)-- TED hose applied, monitor Filed Weights   06/05/23 1253 06/06/23 0543 06/07/23 0548  Weight: 57.5 kg 57.7 kg 59.1 kg      12: Hypertension: continue carvedilol 6.25 mg BID -06/05/23 overall stable, mild hypotension at times during dialysis but stable otherwise -06/06/23 BPs stable today but a little orthostatic in PT (see above #11), monitor closely  Vitals:   06/05/23 1145 06/05/23 1200 06/05/23 1213 06/05/23 1232  BP: 110/70 (!) 84/57 (!) 92/59 (!) 92/59   06/05/23 1235 06/05/23 1343 06/05/23 1348 06/05/23 2013  BP: 114/67 107/67 124/81 110/63   06/06/23 0543 06/06/23 1254 06/06/23 1951 06/07/23 0527  BP: 111/64 96/63 110/63 114/67      13: Iron deficiency: continue Ferrlecit through 6/02-- completed   14: ESRD on HD via right IJ TDC on 5/20             -HD on T/T/S schedule             -has been accepted at [facility] on TTS 11:40    -Appreciate nephrology assistance   15: Anemia: continue IV iron and follow-up CBC on routine labs  -06/05/23 H/H stable, monitor routine labs   16:  Cholelithiasis/cholecystitis s/p cholecystostomy tube -leukocytosis resolved, continue Augmentin through 6/01-- had one dose 05/28/23 and then d/c'd, per pharmacy and d/c summary, end date 5/31 -will need follow up in drain clinic and follow up with Dr. Sheliah Hatch after that to discuss whether she is a candidate for laparoscopic cholecystectomy - Daily flushes 5-10 cc    17: History of CAD: continue Plavix, statin, BB   18: Constipation possible contributing to nausea and lack of appetite             -continue Colace 100mg  BID and Miralax 17g BID; Dulcolax supp prn  -Continue Zofran 4mg  q6h PRN for nausea  -multiple stools 6/9    LOS: 10 days A FACE TO FACE EVALUATION WAS PERFORMED  Erick Colace 06/07/2023, 9:45 AM

## 2023-06-07 NOTE — Progress Notes (Signed)
New Dialysis Start    Patient identified as new dialysis start. Kidney Education packet assembled and given. Discussed the following items with patient:     Current medications and possible changes once started:  Discussed that patient's medications may change over time.  Ex; hypertension medications and diabetes medication.  Nephrologists will adjust as needed.   Fluid restrictions reviewed:  32 oz daily goal:  All liquids count; soups, ice, jello, fruits.   Phosphorus and potassium: Handout given showing high potassium and phosphorus foods.  Alternative food and drink options given.    Family support:  Daughter at bedside   Outpatient Clinic Resources:  Discussed roles of Outpatient clinic staff and advised to make a list of needs, if any, to talk with outpatient staff if needed.   Care plan schedule: Informed patient of Care Plans in outpatient setting and to participate in the care plan.  An invitation would be given from outpatient clinic.    Dialysis Access Options:  Reviewed access options with patients. Discussed in detail about care at home with new AVG & AVF. Reviewed checking bruit and thrill. If dialysis catheter present, educated that patient could not take showers.  Catheter dressing changes were to be done by outpatient clinic staff only   Home therapy options:  Educated patient about home therapy options:  PD vs home hemo.     Patient verbalized understanding. Will continue to round on patient during admission.    Takeo Harts Dialysis Nurse Coordinator [phone] 

## 2023-06-07 NOTE — Progress Notes (Signed)
Inpatient Rehabilitation Discharge Medication Review by a Pharmacist  A complete drug regimen review was completed for this patient to identify any potential clinically significant medication issues.  High Risk Drug Classes Is patient taking? Indication by Medication  Antipsychotic No   Anticoagulant No   Antibiotic No   Opioid No   Antiplatelet Yes Clopidogrel - CVA  Hypoglycemics/insulin Yes Insulin - DM  Vasoactive Medication Yes Carvedilol - HTN  Chemotherapy No   Other Yes Atorvastatin - HLD Sevelamer - ESRD Zofran prn N/V     Type of Medication Issue Identified Description of Issue Recommendation(s)  Drug Interaction(s) (clinically significant)     Duplicate Therapy     Allergy     No Medication Administration End Date     Incorrect Dose     Additional Drug Therapy Needed     Significant med changes from prior encounter (inform family/care partners about these prior to discharge).    Other       Clinically significant medication issues were identified that warrant physician communication and completion of prescribed/recommended actions by midnight of the next day:  No  Pharmacist comments:   Time spent performing this drug regimen review (minutes):  20 minutes  Thank you Okey Regal, PharmD

## 2023-06-08 ENCOUNTER — Telehealth: Payer: Self-pay

## 2023-06-08 NOTE — Transitions of Care (Post Inpatient/ED Visit) (Signed)
06/08/2023  Name: Samantha [name] MRN: [id number] DOB: 06/21/1951  Today's TOC FU Call Status: Today's TOC FU Call Status:: Successful TOC FU Call Competed TOC FU Call Complete Date: 06/08/23  Transition Care Management Follow-up Telephone Call Date of Discharge: 06/07/23 Discharge Facility: [facility]) Type of Discharge: Inpatient Admission Primary Inpatient Discharge Diagnosis:: acute metabolic encephalopathy How have you been since you were released from the hospital?: Same (Per Samantha [name], her mother is the same as when she left the hospital) Any questions or concerns?: Yes Patient Questions/Concerns:: Samantha [name] reports that her mother's birthday on her insurance card is incorrect and she is trying to get that corrected. Patient Questions/Concerns Addressed: Other: (her daughter is trying to correct the birhtday error on the Medicaid card)  Items Reviewed: Did you receive and understand the discharge instructions provided?: Yes Medications obtained,verified, and reconciled?: Yes (Medications Reviewed) (She doesn't have all of the meds because the birthday on the insurance card is incorrect and the pharmacy would not give her the meds. She doesn't have atorvastatin, sevelamer. Samantha [name] is trying to contact Medicaid to correct the problem.) Any new allergies since your discharge?: No Dietary orders reviewed?: Yes Type of Diet Ordered:: renal diet, heart healthy. 1200 ml/day fluid restriction- Samantha [name] reports compliance.  the prescribed feeding supplement is not covered by insurance Do you have support at home?: Yes People in Home: child(ren), adult Name of Support/Comfort Primary Source: She lives with her daughter and grandchildren  Medications Reviewed Today: Medications Reviewed Today     Reviewed by Samantha Peers, RN (Case Manager) on 06/08/23 at 1917  Med List Status: <None>   Medication Order Taking? Sig Documenting Provider Last Dose Status Informant  acetaminophen  (TYLENOL) 325 MG tablet [id number]  Take 1-2 tablets (325-650 mg total) by mouth every 4 (four) hours as needed for mild pain. Setzer, Lynnell Jude, PA-C  Active   atorvastatin (LIPITOR) 80 MG tablet [id number]  Take 1 tablet (80 mg total) by mouth daily. Setzer, Lynnell Jude, PA-C  Active            Med Note Samantha [name]   Tue Jun 08, 2023  7:15 PM) Does not have this yet.   carvedilol (COREG) 6.25 MG tablet [id number]  Take 1 tablet (6.25 mg total) by mouth 2 (two) times daily with a meal. Setzer, Lynnell Jude, PA-C  Active   clopidogrel (PLAVIX) 75 MG tablet [id number]  Take 1 tablet (75 mg total) by mouth daily. Setzer, Lynnell Jude, PA-C  Active   insulin detemir (LEVEMIR) 100 UNIT/ML FlexPen [id number]  Inject 15 Units into the skin at bedtime. Setzer, Lynnell Jude, PA-C  Active   Nutritional Supplements (FEEDING SUPPLEMENT, NEPRO CARB STEADY,) LIQD [id number]  Take 237 mLs by mouth 2 (two) times daily between meals. Setzer, Lynnell Jude, PA-C  Active            Med Note Samantha [name]   Tue Jun 08, 2023  7:16 PM) She does not have this  ondansetron (ZOFRAN) 4 MG tablet [id number]  Take 1 tablet (4 mg total) by mouth every 6 (six) hours as needed for nausea. Setzer, Lynnell Jude, PA-C  Active   sevelamer carbonate (RENVELA) 800 MG tablet [id number]  Take 1 tablet (800 mg total) by mouth 3 (three) times daily with meals. Setzer, Lynnell Jude, PA-C  Active            Med Note Samantha [name]   Tue Jun 08, 2023  7:16 PM) She does not have this  Home Care and Equipment/Supplies: Were Home Health Services Ordered?: No Any new equipment or medical supplies ordered?: Yes Name of Medical supply agency?: [employer]- wheelchair Were you able to get the equipment/medical supplies?: Yes Do you have any questions related to the use of the equipment/supplies?: No  Functional Questionnaire: Do you need assistance with bathing/showering or dressing?: Yes (Samantha [name] assists) Do you need assistance with meal preparation?: Yes  (Samantha [name] assists) Do you need assistance with eating?: No Do you have difficulty maintaining continence: Yes (She has a BSC as well as pull ups  but she still has episdoes of incontinence) Do you need assistance with getting out of bed/getting out of a chair/moving?: Yes (She has the wheelchair but ambulates with the walker and assistance.) Do you have difficulty managing or taking your medications?: Yes [facility] manages her medications, checks her blood sugars and administers her insulin.)  Follow up appointments reviewed: PCP Follow-up appointment confirmed?: No (I will check availablity for HFU appt at [facility].  Samantha [name] does not mind taking her mother to another clinic) MD Provider Line [phone] Given: No Specialist Hospital Follow-up appointment confirmed?: No Reason Specialist Follow-Up Not Confirmed: Patient has Specialist Provider Number and will Call for Appointment (Her daughter needs to call PMR and the surgeon for appointments.  The surgeon wants to discuss gall bladder surgery.  also needs outpatient neurorehab appts.) Do you need transportation to your follow-up appointment?: No Samantha [name] takes her to medical appts.  Transportation is arranged to take her to dialysis T/T/S.) Do you understand care options if your condition(s) worsen?: Yes-patient verbalized understanding    SIGNATURE Audie Box

## 2023-06-11 NOTE — Progress Notes (Signed)
Patient ID: Samantha [name], female   DOB: 01-15-1951, 72 y.o.   MRN: [id number]  6/13: CSW received text from patient's daughter stating she has not been able to fill her medications due to patient's Medicaid card having the incorrect birth date. She confirmed pharmacy filled meds for her "this one time". CSW Neurosurgeon counseling for assistance since [place] application was completed in the hospital. Georgette Shell confirmed patient's birth date is correct on the application (2051-08-04) so it is likely a typing error. She will reach out to DSS Medicaid Supervisor to see if they can correct the issue. Daughter updated.   Joaquin Courts, MSW, [facility]

## 2023-06-16 ENCOUNTER — Telehealth: Payer: Self-pay

## 2023-06-16 NOTE — Telephone Encounter (Signed)
Call received from patients daughter, Samantha [name] to schedule the hospital follow up appointment for her mother. Her PCP did not have any appointments available and Samantha [name] was in agreement to having her mother seen at another clinic.  The appointment was scheduled for 06/18/2023 @ 1520 at [facility].  The address and phone number for the clinic were provided. Samantha [name] will provide transportation for her mother.   Samantha [name] said she has been in contact with Medicaid to correct her mother's birthday.  She said said it should be correct now but may take a few days to show it correctly in their computer system.   She then said she needed another medication : amsino. She said she uses it for the cholecystostomy tube and she has been emptying the bag daily.  I instructed her to call the surgeon's office : [phone] with any questions about the cholecystostomy and to schedule a hospital follow up appointment.  She said she understood and would call.

## 2023-06-17 ENCOUNTER — Inpatient Hospital Stay: Payer: Self-pay | Admitting: Nurse Practitioner

## 2023-06-18 ENCOUNTER — Ambulatory Visit: Payer: Medicaid Other | Admitting: Nurse Practitioner

## 2023-06-18 ENCOUNTER — Encounter: Payer: Self-pay | Admitting: Nurse Practitioner

## 2023-06-18 VITALS — BP 126/56 | HR 80

## 2023-06-18 DIAGNOSIS — Z789 Other specified health status: Secondary | ICD-10-CM

## 2023-06-18 DIAGNOSIS — E118 Type 2 diabetes mellitus with unspecified complications: Secondary | ICD-10-CM | POA: Diagnosis not present

## 2023-06-18 DIAGNOSIS — J9601 Acute respiratory failure with hypoxia: Secondary | ICD-10-CM

## 2023-06-18 DIAGNOSIS — I10 Essential (primary) hypertension: Secondary | ICD-10-CM

## 2023-06-18 DIAGNOSIS — K810 Acute cholecystitis: Secondary | ICD-10-CM

## 2023-06-18 DIAGNOSIS — Z992 Dependence on renal dialysis: Secondary | ICD-10-CM

## 2023-06-18 DIAGNOSIS — Z7409 Other reduced mobility: Secondary | ICD-10-CM | POA: Insufficient documentation

## 2023-06-18 DIAGNOSIS — Z794 Long term (current) use of insulin: Secondary | ICD-10-CM

## 2023-06-18 DIAGNOSIS — I63442 Cerebral infarction due to embolism of left cerebellar artery: Secondary | ICD-10-CM | POA: Diagnosis not present

## 2023-06-18 DIAGNOSIS — E785 Hyperlipidemia, unspecified: Secondary | ICD-10-CM

## 2023-06-18 DIAGNOSIS — G9341 Metabolic encephalopathy: Secondary | ICD-10-CM | POA: Diagnosis not present

## 2023-06-18 DIAGNOSIS — N186 End stage renal disease: Secondary | ICD-10-CM

## 2023-06-18 MED ORDER — SODIUM CHLORIDE FLUSH 0.9 % IV SOLN
INTRAVENOUS | 10 refills | Status: AC
Start: 2023-06-18 — End: ?

## 2023-06-18 MED ORDER — CARVEDILOL 6.25 MG PO TABS
6.2500 mg | ORAL_TABLET | Freq: Two times a day (BID) | ORAL | 2 refills | Status: AC
Start: 2023-06-18 — End: ?

## 2023-06-18 MED ORDER — SEVELAMER CARBONATE 800 MG PO TABS: 800.0000 mg | ORAL_TABLET | Freq: Three times a day (TID) | ORAL | 0 refills | Status: DC

## 2023-06-18 MED ORDER — INSULIN DETEMIR 100 UNIT/ML FLEXPEN
15.0000 [IU] | PEN_INJECTOR | Freq: Every day | SUBCUTANEOUS | 2 refills | Status: DC
Start: 2023-06-18 — End: 2023-11-29

## 2023-06-18 MED ORDER — CLOPIDOGREL BISULFATE 75 MG PO TABS: 75.0000 mg | ORAL_TABLET | Freq: Every day | ORAL | 3 refills | Status: DC

## 2023-06-18 MED ORDER — ATORVASTATIN CALCIUM 80 MG PO TABS: 80.0000 mg | ORAL_TABLET | Freq: Every day | ORAL | 1 refills | Status: DC

## 2023-06-18 NOTE — Assessment & Plan Note (Signed)
BP Readings from Last 3 Encounters:  06/18/23 (!) 126/56  06/07/23 114/67  05/28/23 131/76  Continue Coreg 6.25 mg twice daily DASH diet advised

## 2023-06-18 NOTE — Assessment & Plan Note (Signed)
Most recent A1c 7.1 Patient denies hypoglycemia Continue Levemir 15 units daily Monitor CBG twice daily and reports blood sugar readings less than 70 Medication refilled

## 2023-06-18 NOTE — Assessment & Plan Note (Signed)
5/26>> cholecystostomy tube by IR  Continue cholecystectomy tube to drainage and follow-up with surgery

## 2023-06-18 NOTE — Patient Instructions (Addendum)
Kinsinger, De Blanch, MD. Go on 06/22/2023.   Specialty: General Surgery Why: 1:50 PM. Please arrive 30 min prior to appointment time to check in. Contact information: [address] [place] [address] [phone]     It is important that you exercise regularly at least 30 minutes 5 times a week as tolerated  Think about what you will eat, plan ahead. Choose " clean, green, fresh or frozen" over canned, processed or packaged foods which are more sugary, salty and fatty. 70 to 75% of food eaten should be vegetables and fruit. Three meals at set times with snacks allowed between meals, but they must be fruit or vegetables. Aim to eat over a 12 hour period , example 7 am to 7 pm, and STOP after  your last meal of the day. Fruit juice is best enjoyed in a healthy way, by EATING the fruit.  Thanks for choosing [facility] we consider it a privelige to serve you.

## 2023-06-18 NOTE — Assessment & Plan Note (Signed)
Continue atorvastatin 80 mg daily, Plavix 75 mg daily

## 2023-06-18 NOTE — Assessment & Plan Note (Signed)
Now resolved patient alert and oriented x 4

## 2023-06-18 NOTE — Assessment & Plan Note (Addendum)
Recently discharged from inpatient rehab Plans on starting Outpatient PT, OT, ST at [facility].  Needs assistance with activities of daily living at home including  Referral placed to home health

## 2023-06-18 NOTE — Progress Notes (Signed)
New Patient Office Visit  Subjective:  Patient ID: Samantha [name], female    DOB: October 11, 1951  Age: 72 y.o. MRN: [id number]  CC:  Chief Complaint  Patient presents with   Follow-up    Pt stated-stated feeling weakness denied pain  Pt daughter--requesting for saline flush.    HPI Samantha [name] is a 72 y.o. female  has a past medical history of Acute pyelonephritis, Bacteremia, escherichia coli (01/12/2015), Chest pain, Critical lower limb ischemia (HCC), Hyperlipidemia, Hypertension, Left carotid bruit, PAD (peripheral artery disease) (HCC), and Type II diabetes mellitus (HCC).   Patient presents for hospital discharge follow-up and to establish care for her chronic medical conditions.  Previous patient of Hoy Register, MD .  Patient was on admission at the hospital from 05/05/2023 to 05/28/2023 for acute metabolic encephalopathy in the settings of cholecystitis .Had cholecystectomy placed while in the hospital with plans for her to follow-up with general surgery  She was discharged to CIR afterwards left CIR on 06/07/2023 . Started HD during her admission Goes to hemodialysis at [facility] on Tuesday Thursdays and Saturdays.  Has nontunneled dialysis catheter  on her right chest.   She reports feeling better since she left the rehab but still has some generalized wekaness plans on starting outpatient PT and OT.  She is sitting comfortably in a wheelchair today uses a walker for ambulation at home.  They are requesting for home health nursing to assist with activities of daily living at home.  She is alert and oriented x 4 in the office today.  Patient is accompanied by her daughter and a medical interpreter.     Past Medical History:  Diagnosis Date   Acute pyelonephritis    Bacteremia, escherichia coli 01/12/2015   Chest pain    Critical lower limb ischemia (HCC)    Hyperlipidemia    Hypertension    Left carotid bruit    PAD (peripheral artery disease) (HCC)    Type  II diabetes mellitus (HCC)     Past Surgical History:  Procedure Laterality Date   ABDOMINAL ANGIOGRAM  05/16/2015   Procedure: Abdominal Angiogram;  Surgeon: Runell Gess, MD;  Location: [facility] INVASIVE CV LAB;  Service: Cardiovascular;;   ABDOMINAL HYSTERECTOMY  ~ 2000   IR FLUORO GUIDE CV LINE RIGHT  05/17/2023   IR PERC CHOLECYSTOSTOMY  05/23/2023   IR US GUIDE VASC ACCESS RIGHT  05/17/2023   PERIPHERAL VASCULAR CATHETERIZATION Bilateral 05/16/2015   Procedure: Lower Extremity Angiography;  Surgeon: Runell Gess, MD; renal arteries widely patent, L-SFA 75%, L-pop 95%, L-peroneal 90%; R-SFA 40%, R-pop 50%, R-prox peroneal 90%; directional atherectomy L-SFA, p-pop, drug-eluting balloon angioplasty reducing the stenoses to 0, small linear dissection was not flow limiting       PERIPHERAL VASCULAR CATHETERIZATION Left 05/16/2015   Procedure: Peripheral Vascular Atherectomy;  Surgeon: Runell Gess, MD;  Location: [facility] INVASIVE CV LAB;  Service: Cardiovascular;  Laterality: Left;  sfa    Family History  Problem Relation Age of Onset   Hypertension Mother     Social History   Socioeconomic History   Marital status: Widowed    Spouse name: Not on file   Number of children: Not on file   Years of education: Not on file   Highest education level: Not on file  Occupational History   Not on file  Tobacco Use   Smoking status: Never   Smokeless tobacco: Never  Substance and Sexual Activity   Alcohol use: Not Currently  Alcohol/week: 1.0 - 2.0 standard drink of alcohol    Types: 1 - 2 Cans of beer per week    Comment: occas   Drug use: No   Sexual activity: Never  Other Topics Concern   Not on file  Social History Narrative   Not on file   Social Determinants of Health   Financial Resource Strain: Not on file  Food Insecurity: No Food Insecurity (05/05/2023)   Hunger Vital Sign    Worried About Running Out of Food in the Last Year: Never true    Ran Out of Food in the Last  Year: Never true  Transportation Needs: No Transportation Needs (05/05/2023)   PRAPARE - Administrator, Civil Service (Medical): No    Lack of Transportation (Non-Medical): No  Physical Activity: Not on file  Stress: Not on file  Social Connections: Not on file  Intimate Partner Violence: Not At Risk (05/05/2023)   Humiliation, Afraid, Rape, and Kick questionnaire    Fear of Current or Ex-Partner: No    Emotionally Abused: No    Physically Abused: No    Sexually Abused: No    ROS Review of Systems  Constitutional:  Positive for fatigue. Negative for activity change, appetite change, chills and fever.  HENT:  Negative for congestion, dental problem, drooling and ear discharge.   Respiratory: Negative.    Cardiovascular: Negative.   Gastrointestinal:  Negative for constipation, diarrhea, nausea, rectal pain and vomiting.  Genitourinary: Negative.   Musculoskeletal:  Positive for gait problem and myalgias.  Neurological:  Negative for dizziness, facial asymmetry, light-headedness and headaches.  Psychiatric/Behavioral: Negative.      Objective:   Today's Vitals: BP (!) 126/56 (BP Location: Right Arm, Patient Position: Sitting, Cuff Size: Normal)   Pulse 80   SpO2 100%   Physical Exam Vitals and nursing note reviewed.  Constitutional:      General: She is not in acute distress.    Appearance: Normal appearance. She is obese. She is not ill-appearing, toxic-appearing or diaphoretic.  HENT:     Mouth/Throat:     Mouth: Mucous membranes are moist.     Pharynx: Oropharynx is clear. No oropharyngeal exudate or posterior oropharyngeal erythema.  Eyes:     General: No scleral icterus.       Right eye: No discharge.        Left eye: No discharge.     Extraocular Movements: Extraocular movements intact.     Conjunctiva/sclera: Conjunctivae normal.  Cardiovascular:     Rate and Rhythm: Normal rate and regular rhythm.     Pulses: Normal pulses.     Heart sounds: Normal  heart sounds. No murmur heard.    No friction rub. No gallop.  Pulmonary:     Effort: Pulmonary effort is normal. No respiratory distress.     Breath sounds: Normal breath sounds. No stridor. No wheezing, rhonchi or rales.     Comments: nontunneled dialysis catheter Chest:     Chest wall: No tenderness.  Abdominal:     General: There is no distension.     Palpations: Abdomen is soft.     Tenderness: There is no abdominal tenderness. There is no right CVA tenderness, left CVA tenderness or guarding.     Comments: Cholecystectomy tube in place, site clean and dry , no sign of infection noted.  Musculoskeletal:        General: No swelling, tenderness, deformity or signs of injury.     Right lower leg:  No edema.     Left lower leg: No edema.  Skin:    General: Skin is warm and dry.     Capillary Refill: Capillary refill takes less than 2 seconds.     Coloration: Skin is not jaundiced or pale.     Findings: No bruising, erythema or lesion.  Neurological:     Mental Status: She is alert and oriented to person, place, and time.     Motor: Weakness present.     Coordination: Coordination normal.     Gait: Gait normal.  Psychiatric:        Mood and Affect: Mood normal.        Behavior: Behavior normal.        Thought Content: Thought content normal.        Judgment: Judgment normal.     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  Oxygen saturation 100%        Digestive   Cholecystitis, acute    5/26>> cholecystostomy tube by IR  Continue cholecystectomy tube to drainage and follow-up with surgery        Endocrine   Type 2 diabetes mellitus with complication, with long-term current use of insulin (HCC)    Most recent A1c 7.1 Patient denies hypoglycemia Continue Levemir 15 units daily Monitor CBG twice daily and reports blood sugar readings less than 70 Medication refilled      Relevant Medications   atorvastatin (LIPITOR) 80 MG tablet   insulin detemir (LEVEMIR) 100 UNIT/ML FlexPen     Nervous and Auditory   Acute metabolic encephalopathy - Primary    Now resolved patient alert and oriented x 4        Genitourinary   ESRD (end stage renal disease) on dialysis (HCC)    Continue hemodialysis on Tuesday Thursday Saturdays Renvela 800 mg 3 times daily with meals refilled       Relevant Medications   sevelamer carbonate (RENVELA) 800 MG tablet   Other Relevant Orders   Ambulatory referral to Home Health     Other   Hyperlipidemia    Continue atorvastatin 80 milligrams daily Will check fasting lipid panel at next visit Lab Results  Component Value Date   CHOL 134 11/09/2021   HDL 33 (L) 11/09/2021   LDLCALC 67 11/09/2021   TRIG 168 (H) 11/09/2021   CHOLHDL 4.1 11/09/2021         Relevant Medications   atorvastatin (LIPITOR) 80 MG tablet   carvedilol (COREG) 6.25 MG tablet   clopidogrel (PLAVIX) 75 MG tablet   Impaired mobility and ADLs    Recently discharged from inpatient rehab Plans on starting  Outpatient PT, OT, ST at [facility].  Needs assistance with activities of daily living at home including  Referral placed to home health      Relevant Orders   Ambulatory referral to Home Health     Outpatient Encounter Medications as of 06/18/2023  Medication Sig   acetaminophen (TYLENOL) 325 MG tablet Take 1-2 tablets (325-650 mg total) by mouth every 4 (four) hours as needed for mild pain.   [DISCONTINUED] atorvastatin (LIPITOR) 80 MG tablet Take 1 tablet (80 mg total) by mouth daily.   [DISCONTINUED] carvedilol (COREG) 6.25 MG tablet Take 1 tablet (6.25 mg total) by mouth 2 (two) times daily with a meal.   [DISCONTINUED] clopidogrel (PLAVIX) 75 MG tablet Take 1 tablet (75 mg total) by mouth daily.   [DISCONTINUED] insulin detemir (LEVEMIR) 100 UNIT/ML FlexPen Inject 15 Units into the skin at bedtime.   [DISCONTINUED] sevelamer carbonate (RENVELA) 800 MG tablet Take 1 tablet (800 mg total) by mouth 3 (three) times daily with meals.   atorvastatin (LIPITOR) 80 MG tablet Take 1 tablet (80 mg total) by mouth daily.   carvedilol (COREG) 6.25 MG tablet Take 1 tablet (6.25 mg total) by mouth 2 (two) times daily with a meal.   clopidogrel (PLAVIX) 75 MG tablet Take 1 tablet (75 mg total) by mouth daily.   insulin detemir (LEVEMIR) 100 UNIT/ML FlexPen Inject 15 Units into the skin at bedtime.   ondansetron (ZOFRAN) 4 MG tablet Take 1 tablet (4 mg total) by mouth every 6 (six) hours as needed for nausea. (Patient not taking: Reported on 06/18/2023)   sevelamer carbonate (RENVELA) 800 MG tablet Take 1 tablet (800 mg total) by mouth 3 (three) times daily with meals.   [DISCONTINUED] Nutritional Supplements (FEEDING SUPPLEMENT, NEPRO CARB STEADY,) LIQD Take 237 mLs by mouth 2 (two) times daily between meals.   No facility-administered encounter medications on file as of 06/18/2023.    Follow-up: Return in about 3 months (around 09/18/2023) for HTN, HYPERLIPIDEMIA.   Donell Beers, FNP

## 2023-06-18 NOTE — Assessment & Plan Note (Signed)
Continue hemodialysis on Tuesday Thursday Saturdays Renvela 800 mg 3 times daily with meals refilled

## 2023-06-18 NOTE — Assessment & Plan Note (Addendum)
Now resolved , patient currently on room air.  Oxygen saturation 100%

## 2023-06-18 NOTE — Assessment & Plan Note (Signed)
Continue atorvastatin 80 milligrams daily Will check fasting lipid panel at next visit Lab Results  Component Value Date   CHOL 134 11/09/2021   HDL 33 (L) 11/09/2021   LDLCALC 67 11/09/2021   TRIG 168 (H) 11/09/2021   CHOLHDL 4.1 11/09/2021

## 2023-06-21 ENCOUNTER — Inpatient Hospital Stay: Payer: Self-pay | Admitting: Nurse Practitioner

## 2023-06-23 ENCOUNTER — Ambulatory Visit: Payer: MEDICAID | Admitting: Physical Therapy

## 2023-06-23 ENCOUNTER — Ambulatory Visit: Payer: MEDICAID | Admitting: Speech Pathology

## 2023-06-23 ENCOUNTER — Ambulatory Visit: Payer: MEDICAID | Admitting: Occupational Therapy

## 2023-06-30 ENCOUNTER — Ambulatory Visit: Payer: Medicaid Other | Admitting: Nurse Practitioner

## 2023-06-30 ENCOUNTER — Encounter: Payer: Self-pay | Admitting: Nurse Practitioner

## 2023-06-30 VITALS — BP 159/76 | HR 78 | Temp 97.0°F | Ht 65.0 in | Wt 131.4 lb

## 2023-06-30 DIAGNOSIS — H109 Unspecified conjunctivitis: Secondary | ICD-10-CM | POA: Insufficient documentation

## 2023-06-30 DIAGNOSIS — I10 Essential (primary) hypertension: Secondary | ICD-10-CM | POA: Diagnosis not present

## 2023-06-30 MED ORDER — CIPROFLOXACIN HCL 0.3 % OP SOLN: OPHTHALMIC | 0 refills | Status: DC

## 2023-06-30 MED ORDER — CIPROFLOXACIN HCL 0.3 % OP SOLN
OPHTHALMIC | 0 refills | Status: DC
Start: 2023-06-30 — End: 2023-06-30

## 2023-06-30 NOTE — Progress Notes (Signed)
Acute Office Visit  Subjective:     Patient ID: Samantha [name], female    DOB: August 10, 1951, 72 y.o.   MRN: [id number]  Chief Complaint  Patient presents with   Eye Pain    HPI Ms. Ortega-Alcala has a past medical history of Acute pyelonephritis, Bacteremia, escherichia coli (01/12/2015), Chest pain, Critical lower limb ischemia (HCC), Hyperlipidemia, Hypertension, Left carotid bruit, PAD (peripheral artery disease) (HCC), and Type II diabetes mellitus (HCC).  Patient complains of right eye pain,redness with watery discharge since she left the hospital.  Stated that she initially had the symptoms when she was at the hospital and  was treated with an eyedrop.     Continues hemodialysis on Tuesday Thursday Friday  They have followed up with general surgery, she still has her cholecystectomy drain, has an appointment with IR for drain follow-up on 07/19/2023.   She is accompanied by her daughter who assisted with translation.  They declined medical interpreter today  Review of Systems  Constitutional: Negative.  Negative for diaphoresis, fatigue, fever and unexpected weight change.  HENT:  Negative for congestion, dental problem, drooling, ear discharge and ear pain.   Eyes:  Positive for pain, discharge and redness.  Respiratory:  Negative for apnea, cough, choking, chest tightness and shortness of breath.   Cardiovascular: Negative.  Negative for chest pain, palpitations and leg swelling.  Gastrointestinal: Negative.  Negative for abdominal distention, abdominal pain, anal bleeding and blood in stool.  Musculoskeletal: Negative.   Neurological: Negative.  Negative for dizziness, facial asymmetry, light-headedness and headaches.  Psychiatric/Behavioral: Negative.          Objective:    BP (!) 159/76   Pulse 78   Temp (!) 97 F (36.1 C)   Ht 5\' 5"  (1.651 m)   Wt 131 lb 6.4 oz (59.6 kg)   SpO2 100%   BMI 21.87 kg/m    Physical Exam Vitals and nursing note reviewed.   Constitutional:      General: She is not in acute distress.    Appearance: Normal appearance. She is obese. She is not ill-appearing, toxic-appearing or diaphoretic.  HENT:     Mouth/Throat:     Mouth: Mucous membranes are moist.     Pharynx: Oropharynx is clear. No oropharyngeal exudate or posterior oropharyngeal erythema.  Eyes:     General: No scleral icterus.       Right eye: Discharge present.        Left eye: No discharge.     Extraocular Movements: Extraocular movements intact.     Comments: Right eye appears red with clear watery discharge.  Cardiovascular:     Rate and Rhythm: Normal rate and regular rhythm.     Pulses: Normal pulses.     Heart sounds: Normal heart sounds. No murmur heard.    No friction rub. No gallop.  Pulmonary:     Effort: Pulmonary effort is normal. No respiratory distress.     Breath sounds: Normal breath sounds. No stridor. No wheezing, rhonchi or rales.  Chest:     Chest wall: No tenderness.  Abdominal:     General: There is no distension.     Palpations: Abdomen is soft.     Tenderness: There is no abdominal tenderness. There is no right CVA tenderness, left CVA tenderness or guarding.  Musculoskeletal:        General: No deformity or signs of injury.     Right lower leg: No edema.     Left lower leg:  No edema.     Comments: Sitting comfortably in a wheelchair  Skin:    General: Skin is warm and dry.     Capillary Refill: Capillary refill takes less than 2 seconds.     Coloration: Skin is not jaundiced or pale.     Findings: No bruising, erythema or lesion.  Neurological:     Mental Status: She is alert and oriented to person, place, and time.     Motor: No weakness.     Coordination: Coordination normal.     Gait: Gait normal.  Psychiatric:        Mood and Affect: Mood normal.        Behavior: Behavior normal.        Thought Content: Thought content normal.        Judgment: Judgment normal.     No results found for any visits on  06/30/23.      Assessment & Plan:   Problem List Items Addressed This Visit       Cardiovascular and Mediastinum   Essential hypertension    BP Readings from Last 3 Encounters:  06/30/23 (!) 159/76  06/18/23 (!) 126/56  06/07/23 114/67  Blood pressure is elevated in the office today Continue carvedilol 6.25 mg twice daily, defer management to nephrologist Patient in no acute distress DASH diet advised Has hemodialysis tomorrow        Other   Bacterial conjunctivitis of right eye - Primary    1. Bacterial conjunctivitis of right eye  - ciprofloxacin (CILOXAN) 0.3 % ophthalmic solution; Administer 2 drop into the right eye four times daily for  7 days  Dispense: 5 mL; Refill: 0  Patient will follow-up with ophthalmologist if no improvement Take Tylenol as needed for pain      Relevant Medications   ciprofloxacin (CILOXAN) 0.3 % ophthalmic solution    Meds ordered this encounter  Medications   DISCONTD: ciprofloxacin (CILOXAN) 0.3 % ophthalmic solution    Sig: Administer 2 drop into the right eye four times daily for  7 days    Dispense:  5 mL    Refill:  0   ciprofloxacin (CILOXAN) 0.3 % ophthalmic solution    Sig: Administer 2 drop into the right eye four times daily for  7 days    Dispense:  10 mL    Refill:  0    No follow-ups on file.  Donell Beers, FNP

## 2023-06-30 NOTE — Patient Instructions (Signed)
1. Acute right eye pain  - ciprofloxacin (CILOXAN) 0.3 % ophthalmic solution; Administer 2 drop into the right eye four times daily for  7 days  Dispense: 5 mL; Refill: 0       Thanks for choosing [facility] we consider it a privelige to serve you.

## 2023-06-30 NOTE — Assessment & Plan Note (Addendum)
1. Bacterial conjunctivitis of right eye  - ciprofloxacin (CILOXAN) 0.3 % ophthalmic solution; Administer 2 drop into the right eye four times daily for  7 days  Dispense: 5 mL; Refill: 0  Patient will follow-up with ophthalmologist if no improvement Take Tylenol as needed for pain

## 2023-06-30 NOTE — Assessment & Plan Note (Addendum)
BP Readings from Last 3 Encounters:  06/30/23 (!) 159/76  06/18/23 (!) 126/56  06/07/23 114/67  Blood pressure is elevated in the office today Continue carvedilol 6.25 mg twice daily, defer management to nephrologist Patient in no acute distress DASH diet advised Has hemodialysis tomorrow

## 2023-07-05 ENCOUNTER — Other Ambulatory Visit: Payer: Self-pay | Admitting: Physician Assistant

## 2023-07-05 DIAGNOSIS — I63442 Cerebral infarction due to embolism of left cerebellar artery: Secondary | ICD-10-CM

## 2023-07-19 ENCOUNTER — Ambulatory Visit (HOSPITAL_COMMUNITY): Admission: RE | Admit: 2023-07-19 | Payer: Medicaid Other | Source: Ambulatory Visit

## 2023-07-26 ENCOUNTER — Other Ambulatory Visit (HOSPITAL_COMMUNITY): Payer: Self-pay | Admitting: Radiology

## 2023-07-26 ENCOUNTER — Ambulatory Visit (HOSPITAL_COMMUNITY)
Admission: RE | Admit: 2023-07-26 | Discharge: 2023-07-26 | Disposition: A | Discharge status: Home / Self Care | Payer: Medicaid Other | Source: Ambulatory Visit | Attending: Radiology | Admitting: Radiology

## 2023-07-26 DIAGNOSIS — K8020 Calculus of gallbladder without cholecystitis without obstruction: Secondary | ICD-10-CM

## 2023-07-26 HISTORY — PX: IR EXCHANGE BILIARY DRAIN: IMG6046

## 2023-07-26 MED ORDER — LIDOCAINE HCL 1 % IJ SOLN
INTRAMUSCULAR | Status: AC
  Filled 2023-07-26: qty 20

## 2023-07-26 MED ORDER — IOHEXOL 300 MG/ML  SOLN
50.0000 mL | Freq: Once | INTRAMUSCULAR | Status: AC | PRN
  Administered 2023-07-26: 15 mL

## 2023-07-27 IMAGING — CT CT ANGIO HEAD-NECK (W OR W/O PERF)
1 of 11 series · 5 of 33 positions shown · IV contrast (omnipaque)
Comparison: Brain MRI and MRA from yesterday

CLINICAL DATA: Vertebral dissection suspected

EXAM:
CT ANGIOGRAPHY HEAD AND NECK
TECHNIQUE: Multidetector CT imaging of the head and neck was performed using
the standard protocol during bolus administration of intravenous
contrast. Multiplanar CT image reconstructions and MIPs were
obtained to evaluate the vascular anatomy. Carotid stenosis
measurements (when applicable) are obtained utilizing NASCET
criteria, using the distal internal carotid diameter as the
denominator.
CONTRAST:  50mL OMNIPAQUE IOHEXOL 350 MG/ML SOLN

[Series 11: ax thins · axial · 0.39mm/px · z∈[-241,-25]mm · 5 of 326 slices shown]
[im 55/326  soft-tissue]
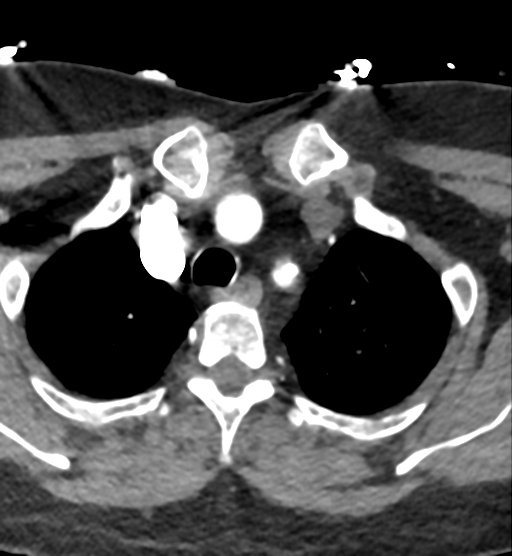
[im 109/326  bone]
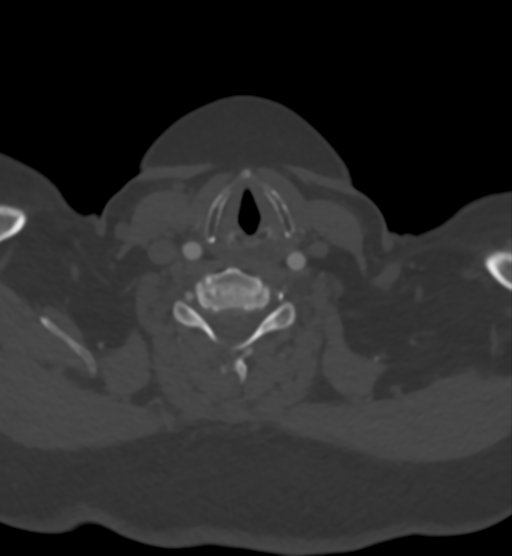
[im 163/326  soft-tissue]
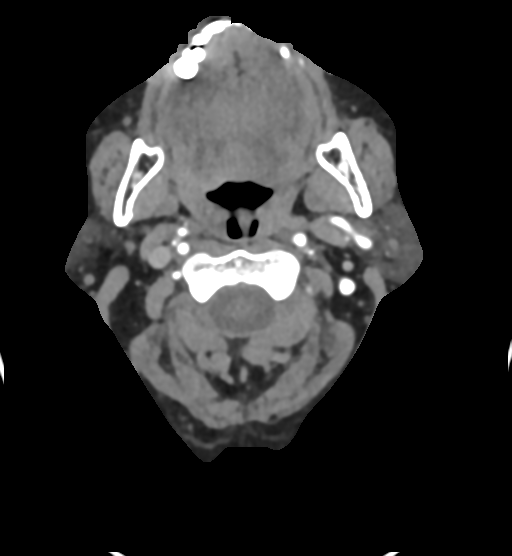
[im 217/326  bone]
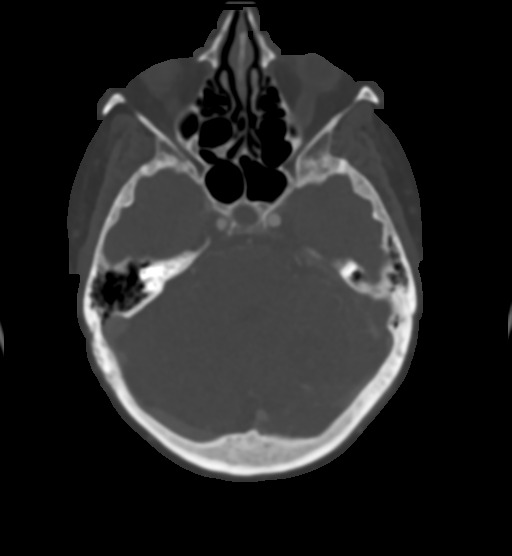
[im 271/326  soft-tissue]
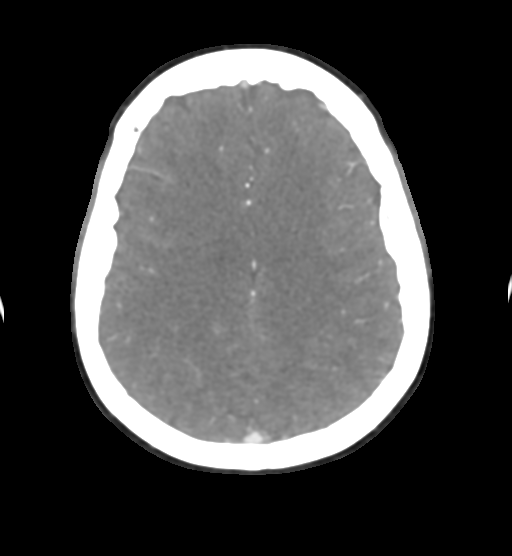

[5 of 33 positions shown; findings below may reference images not displayed]

FINDINGS: CT HEAD FINDINGS

Brain: Acute left superior cerebellar territory are infarct. No
acute hemorrhage. No hydrocephalus or masslike finding

Vascular: See below

Skull: Benign

Sinuses: Benign

Orbits: Negative

Review of the MIP images confirms the above findings

CTA NECK FINDINGS

Aortic arch: Atheromatous plaque.  Two vessel branching.

Right carotid system: Atheromatous calcification at the bifurcation.
No stenosis or ulceration.

Left carotid system: Atheromatous plaque at the bifurcation. No
stenosis or ulceration

Vertebral arteries: Proximal subclavian atherosclerosis especially
on the left where there is a low-density plaque and vessel kink
causing 65% narrowing. Intermittent thready flow in the left
vertebral artery with faint density especially at the V3 segment,
dissection is again suspected.

Skeleton: Negative

Other neck: Negative

Upper chest: Negative

Review of the MIP images confirms the above findings

CTA HEAD FINDINGS

Anterior circulation: Atheromatous plaque affecting the carotid
siphons. Notable right paraclinoid ICA stenosis measuring ~50%.
Severe stenosis the left M1 segment with symmetric density of
downstream vessels. Negative for aneurysm

Posterior circulation: Small vertebral and basilar arteries in the
setting of fetal type circulation. Minimal faint flow in the left
vertebral artery with high-grade distal narrowing before the
basilar. Diffusely small basilar from fetal type circulation with
tandem at least moderate stenoses best seen on thick MIPS. Moderate
left P2 and bilateral P3 segment stenosis.

Venous sinuses: Unremarkable in the arterial phase

Anatomic variants: As above

Review of the MIP images confirms the above findings
IMPRESSION: 1. Stable from MRI/MRA yesterday.
2. Intermittent severe stenosis of the left vertebral artery with
minimal intradural contribution, dissection is again a strong
consideration given the degree of irregularity. 65% stenosis of the
proximal left subclavian artery from atherosclerosis and vessel
kink.
3. Severe left M1 segment stenosis.
4. At least moderate atheromatous narrowings of the proximal
basilar. Moderate bilateral PCA narrowings.

## 2023-08-06 ENCOUNTER — Other Ambulatory Visit: Payer: Self-pay | Admitting: Nurse Practitioner

## 2023-08-06 DIAGNOSIS — N186 End stage renal disease: Secondary | ICD-10-CM

## 2023-08-06 NOTE — Telephone Encounter (Signed)
Please advise Kh 

## 2023-09-02 ENCOUNTER — Telehealth: Payer: Self-pay | Admitting: Nurse Practitioner

## 2023-09-02 NOTE — Telephone Encounter (Signed)
[name] from [facility] LVM on nurse line stating she faxed a medical clearance request for patient's upcoming surgery with plavix holding instructions.  Patient of Samantha [name] Please complete form and return. If questions, please call [phone]

## 2023-09-07 NOTE — Telephone Encounter (Signed)
Form will be resent to me urgent .Marland Kitchen Thanks [employer]

## 2023-09-08 ENCOUNTER — Other Ambulatory Visit: Payer: Self-pay | Admitting: Physician Assistant

## 2023-09-08 ENCOUNTER — Other Ambulatory Visit: Payer: Self-pay | Admitting: Nurse Practitioner

## 2023-09-08 DIAGNOSIS — Z992 Dependence on renal dialysis: Secondary | ICD-10-CM

## 2023-09-08 DIAGNOSIS — I63442 Cerebral infarction due to embolism of left cerebellar artery: Secondary | ICD-10-CM

## 2023-09-08 DIAGNOSIS — E785 Hyperlipidemia, unspecified: Secondary | ICD-10-CM

## 2023-09-17 ENCOUNTER — Ambulatory Visit: Payer: Self-pay | Admitting: Nurse Practitioner

## 2023-09-20 ENCOUNTER — Ambulatory Visit: Payer: Self-pay | Admitting: General Surgery

## 2023-09-22 ENCOUNTER — Ambulatory Visit: Payer: Self-pay | Admitting: Nurse Practitioner

## 2023-09-22 NOTE — Progress Notes (Addendum)
   09/22/23 1822  Pre-op Phone Call  Surgery Date Verified 09/23/23  Arrival Time Verified 0915  Surgery Location Verified [facility] Short [facility]  Take the Following Meds the Morning of Surgery Lipitor,Coreg,Ocuflox eye gtts. Levemir insulin - 7 units tonight at bedtime  Allowed clear liquids Water;Juice (not-citric and without pulp - diabetics please choose diet or no sugar options);Clear Tea;Black Coffee Only (no creamer, milk or cream including half and half)  Patient instructed to stop clear liquids including Carb loading drink at: 0845  Stop Solids, Milk, Candy, and Gum STARTING AT MIDNIGHT  No Jewelry, money, nail polish or make-up.  No lotions, powders, perfumes. No shaving  48 hrs. prior to surgery. Yes   Brush your teeth,bathe with antibacterial soap. Wear clean,comfortable clothes. Do not take Ozempic or Plavix. Check your blood sugar when you get up in the morning. If it is less than 70 call [phone] for instructions. Call this number if you are sick or running late.

## 2023-09-22 NOTE — Progress Notes (Signed)
Unable to contact patient or her daughters . Left pre op instructions over the phone via interpreter .

## 2023-09-23 ENCOUNTER — Other Ambulatory Visit: Payer: Self-pay

## 2023-09-23 ENCOUNTER — Ambulatory Visit (HOSPITAL_COMMUNITY): Payer: Self-pay | Admitting: Vascular Surgery

## 2023-09-23 ENCOUNTER — Ambulatory Visit (HOSPITAL_COMMUNITY)
Admission: RE | Admit: 2023-09-23 | Discharge: 2023-09-23 | Disposition: A | Discharge status: Home / Self Care | Payer: Medicaid Other | Attending: General Surgery | Admitting: General Surgery

## 2023-09-23 ENCOUNTER — Ambulatory Visit (HOSPITAL_BASED_OUTPATIENT_CLINIC_OR_DEPARTMENT_OTHER): Payer: Self-pay | Admitting: Vascular Surgery

## 2023-09-23 ENCOUNTER — Encounter (HOSPITAL_COMMUNITY)
Admission: RE | Disposition: A | Discharge status: Home / Self Care | Payer: Self-pay | Source: Home / Self Care | Attending: General Surgery

## 2023-09-23 ENCOUNTER — Encounter (HOSPITAL_COMMUNITY): Payer: Self-pay | Admitting: General Surgery

## 2023-09-23 DIAGNOSIS — K8012 Calculus of gallbladder with acute and chronic cholecystitis without obstruction: Secondary | ICD-10-CM | POA: Diagnosis present

## 2023-09-23 DIAGNOSIS — I120 Hypertensive chronic kidney disease with stage 5 chronic kidney disease or end stage renal disease: Secondary | ICD-10-CM | POA: Insufficient documentation

## 2023-09-23 DIAGNOSIS — E1122 Type 2 diabetes mellitus with diabetic chronic kidney disease: Secondary | ICD-10-CM | POA: Diagnosis not present

## 2023-09-23 DIAGNOSIS — Z79899 Other long term (current) drug therapy: Secondary | ICD-10-CM | POA: Diagnosis not present

## 2023-09-23 DIAGNOSIS — Z992 Dependence on renal dialysis: Secondary | ICD-10-CM | POA: Diagnosis not present

## 2023-09-23 DIAGNOSIS — K8020 Calculus of gallbladder without cholecystitis without obstruction: Secondary | ICD-10-CM | POA: Diagnosis not present

## 2023-09-23 DIAGNOSIS — Z794 Long term (current) use of insulin: Secondary | ICD-10-CM | POA: Insufficient documentation

## 2023-09-23 DIAGNOSIS — E1149 Type 2 diabetes mellitus with other diabetic neurological complication: Secondary | ICD-10-CM

## 2023-09-23 DIAGNOSIS — N186 End stage renal disease: Secondary | ICD-10-CM

## 2023-09-23 HISTORY — PX: CHOLECYSTECTOMY: SHX55

## 2023-09-23 LAB — POCT I-STAT, CHEM 8
BUN: 27 mg/dL — ABNORMAL HIGH (ref 8–23)
Calcium, Ion: 0.9 mmol/L — ABNORMAL LOW (ref 1.15–1.40)
Chloride: 110 mmol/L (ref 98–111)
Creatinine, Ser: 2.2 mg/dL — ABNORMAL HIGH (ref 0.44–1.00)
Glucose, Bld: 126 mg/dL — ABNORMAL HIGH (ref 70–99)
HCT: 38 % (ref 36.0–46.0)
Hemoglobin: 12.9 g/dL (ref 12.0–15.0)
Potassium: 4.6 mmol/L (ref 3.5–5.1)
Sodium: 135 mmol/L (ref 135–145)
TCO2: 17 mmol/L — ABNORMAL LOW (ref 22–32)

## 2023-09-23 LAB — GLUCOSE, CAPILLARY
Glucose-Capillary: 127 mg/dL — ABNORMAL HIGH (ref 70–99)
Glucose-Capillary: 175 mg/dL — ABNORMAL HIGH (ref 70–99)

## 2023-09-23 SURGERY — LAPAROSCOPIC CHOLECYSTECTOMY
Anesthesia: General

## 2023-09-23 MED ORDER — PROMETHAZINE HCL 25 MG/ML IJ SOLN: 6.2500 mg | Freq: Once | INTRAMUSCULAR | Status: DC

## 2023-09-23 MED ORDER — CHLORHEXIDINE GLUCONATE 0.12 % MT SOLN: 15.0000 mL | Freq: Once | OROMUCOSAL | Status: AC

## 2023-09-23 MED ORDER — SUGAMMADEX SODIUM 200 MG/2ML IV SOLN
INTRAVENOUS | Status: DC | PRN
  Administered 2023-09-23: 200 mg via INTRAVENOUS

## 2023-09-23 MED ORDER — BUPIVACAINE-EPINEPHRINE 0.25% -1:200000 IJ SOLN
INTRAMUSCULAR | Status: DC | PRN
  Administered 2023-09-23: 20 mL

## 2023-09-23 MED ORDER — PROPOFOL 1000 MG/100ML IV EMUL
INTRAVENOUS | Status: AC
  Filled 2023-09-23: qty 100

## 2023-09-23 MED ORDER — PROMETHAZINE HCL 25 MG/ML IJ SOLN
INTRAMUSCULAR | Status: AC
  Filled 2023-09-23: qty 1

## 2023-09-23 MED ORDER — LIDOCAINE 2% (20 MG/ML) 5 ML SYRINGE
INTRAMUSCULAR | Status: DC | PRN
  Administered 2023-09-23: 40 mg via INTRAVENOUS

## 2023-09-23 MED ORDER — ACETAMINOPHEN 160 MG/5ML PO SOLN: 1000.0000 mg | Freq: Once | ORAL | Status: DC | PRN

## 2023-09-23 MED ORDER — CEFAZOLIN SODIUM-DEXTROSE 2-4 GM/100ML-% IV SOLN
INTRAVENOUS | Status: AC
  Filled 2023-09-23: qty 100

## 2023-09-23 MED ORDER — BUPIVACAINE-EPINEPHRINE (PF) 0.25% -1:200000 IJ SOLN
INTRAMUSCULAR | Status: AC
  Filled 2023-09-23: qty 30

## 2023-09-23 MED ORDER — DEXAMETHASONE SODIUM PHOSPHATE 10 MG/ML IJ SOLN
INTRAMUSCULAR | Status: AC
  Filled 2023-09-23: qty 1

## 2023-09-23 MED ORDER — ROCURONIUM BROMIDE 10 MG/ML (PF) SYRINGE
PREFILLED_SYRINGE | INTRAVENOUS | Status: AC
  Filled 2023-09-23: qty 10

## 2023-09-23 MED ORDER — CEFAZOLIN SODIUM-DEXTROSE 2-4 GM/100ML-% IV SOLN
2.0000 g | INTRAVENOUS | Status: AC
  Administered 2023-09-23: 2 g via INTRAVENOUS

## 2023-09-23 MED ORDER — ACETAMINOPHEN 500 MG PO TABS: 1000.0000 mg | ORAL_TABLET | ORAL | Status: AC

## 2023-09-23 MED ORDER — ACETAMINOPHEN 500 MG PO TABS
ORAL_TABLET | ORAL | Status: AC
  Administered 2023-09-23: 1000 mg via ORAL
  Filled 2023-09-23: qty 2

## 2023-09-23 MED ORDER — HEMOSTATIC AGENTS (NO CHARGE) OPTIME
TOPICAL | Status: DC | PRN
Start: 2023-09-23 — End: 2023-09-23
  Administered 2023-09-23: 1 via TOPICAL

## 2023-09-23 MED ORDER — ACETAMINOPHEN 10 MG/ML IV SOLN
INTRAVENOUS | Status: AC
  Filled 2023-09-23: qty 100

## 2023-09-23 MED ORDER — OXYCODONE HCL 5 MG PO TABS: 5.0000 mg | ORAL_TABLET | Freq: Once | ORAL | Status: DC | PRN

## 2023-09-23 MED ORDER — METOCLOPRAMIDE HCL 5 MG/ML IJ SOLN
5.0000 mg | Freq: Once | INTRAMUSCULAR | Status: AC
  Administered 2023-09-23: 5 mg via INTRAVENOUS

## 2023-09-23 MED ORDER — CHLORHEXIDINE GLUCONATE CLOTH 2 % EX PADS: 6.0000 | MEDICATED_PAD | Freq: Once | CUTANEOUS | Status: DC

## 2023-09-23 MED ORDER — ROCURONIUM BROMIDE 10 MG/ML (PF) SYRINGE
PREFILLED_SYRINGE | INTRAVENOUS | Status: DC | PRN
  Administered 2023-09-23: 10 mg via INTRAVENOUS
  Administered 2023-09-23: 40 mg via INTRAVENOUS

## 2023-09-23 MED ORDER — ONDANSETRON HCL 4 MG/2ML IJ SOLN
INTRAMUSCULAR | Status: DC | PRN
  Administered 2023-09-23: 4 mg via INTRAVENOUS

## 2023-09-23 MED ORDER — MIDAZOLAM HCL 2 MG/2ML IJ SOLN
INTRAMUSCULAR | Status: AC
  Filled 2023-09-23: qty 2

## 2023-09-23 MED ORDER — FENTANYL CITRATE (PF) 250 MCG/5ML IJ SOLN
INTRAMUSCULAR | Status: DC | PRN
  Administered 2023-09-23 (×3): 50 ug via INTRAVENOUS

## 2023-09-23 MED ORDER — ACETAMINOPHEN 10 MG/ML IV SOLN: 1000.0000 mg | Freq: Once | INTRAVENOUS | Status: DC | PRN

## 2023-09-23 MED ORDER — LABETALOL HCL 5 MG/ML IV SOLN
5.0000 mg | INTRAVENOUS | Status: AC | PRN
  Administered 2023-09-23: 10 mg via INTRAVENOUS
  Administered 2023-09-23: 5 mg via INTRAVENOUS

## 2023-09-23 MED ORDER — OXYCODONE HCL 5 MG/5ML PO SOLN: 5.0000 mg | Freq: Once | ORAL | Status: DC | PRN

## 2023-09-23 MED ORDER — FENTANYL CITRATE (PF) 100 MCG/2ML IJ SOLN
INTRAMUSCULAR | Status: AC
  Filled 2023-09-23: qty 2

## 2023-09-23 MED ORDER — ORAL CARE MOUTH RINSE: 15.0000 mL | Freq: Once | OROMUCOSAL | Status: AC

## 2023-09-23 MED ORDER — PROPOFOL 10 MG/ML IV BOLUS
INTRAVENOUS | Status: DC | PRN
  Administered 2023-09-23: 20 mg via INTRAVENOUS
  Administered 2023-09-23: 50 mg via INTRAVENOUS

## 2023-09-23 MED ORDER — ONDANSETRON HCL 4 MG/2ML IJ SOLN
INTRAMUSCULAR | Status: AC
  Filled 2023-09-23: qty 2

## 2023-09-23 MED ORDER — FENTANYL CITRATE (PF) 250 MCG/5ML IJ SOLN
INTRAMUSCULAR | Status: AC
  Filled 2023-09-23: qty 5

## 2023-09-23 MED ORDER — SODIUM CHLORIDE 0.9 % IV SOLN: INTRAVENOUS | Status: DC

## 2023-09-23 MED ORDER — HYDRALAZINE HCL 20 MG/ML IJ SOLN
10.0000 mg | Freq: Once | INTRAMUSCULAR | Status: AC
  Administered 2023-09-23: 10 mg via INTRAVENOUS

## 2023-09-23 MED ORDER — AMISULPRIDE (ANTIEMETIC) 5 MG/2ML IV SOLN
INTRAVENOUS | Status: AC
  Filled 2023-09-23: qty 4

## 2023-09-23 MED ORDER — FENTANYL CITRATE (PF) 100 MCG/2ML IJ SOLN
25.0000 ug | INTRAMUSCULAR | Status: DC | PRN
  Administered 2023-09-23: 50 ug via INTRAVENOUS

## 2023-09-23 MED ORDER — METOCLOPRAMIDE HCL 5 MG/ML IJ SOLN
INTRAMUSCULAR | Status: AC
  Filled 2023-09-23: qty 2

## 2023-09-23 MED ORDER — LABETALOL HCL 5 MG/ML IV SOLN
INTRAVENOUS | Status: AC
  Administered 2023-09-23: 5 mg
  Filled 2023-09-23: qty 4

## 2023-09-23 MED ORDER — DEXAMETHASONE SODIUM PHOSPHATE 10 MG/ML IJ SOLN
INTRAMUSCULAR | Status: DC | PRN
  Administered 2023-09-23: 10 mg via INTRAVENOUS

## 2023-09-23 MED ORDER — ACETAMINOPHEN 500 MG PO TABS: 1000.0000 mg | ORAL_TABLET | Freq: Once | ORAL | Status: DC | PRN

## 2023-09-23 MED ORDER — CHLORHEXIDINE GLUCONATE 0.12 % MT SOLN
OROMUCOSAL | Status: AC
  Administered 2023-09-23: 15 mL via OROMUCOSAL
  Filled 2023-09-23: qty 15

## 2023-09-23 MED ORDER — HYDRALAZINE HCL 20 MG/ML IJ SOLN
INTRAMUSCULAR | Status: AC
  Filled 2023-09-23: qty 1

## 2023-09-23 MED ORDER — OXYCODONE HCL 5 MG PO TABS: 5.0000 mg | ORAL_TABLET | Freq: Four times a day (QID) | ORAL | 0 refills | Status: AC | PRN

## 2023-09-23 MED ORDER — LIDOCAINE 2% (20 MG/ML) 5 ML SYRINGE
INTRAMUSCULAR | Status: AC
  Filled 2023-09-23: qty 5

## 2023-09-23 MED ORDER — AMISULPRIDE (ANTIEMETIC) 5 MG/2ML IV SOLN
10.0000 mg | Freq: Once | INTRAVENOUS | Status: AC
  Administered 2023-09-23: 10 mg via INTRAVENOUS

## 2023-09-23 SURGICAL SUPPLY — 44 items
ADH SKN CLS APL DERMABOND .7 (GAUZE/BANDAGES/DRESSINGS) ×1
ADH SKN CLS LQ APL DERMABOND (GAUZE/BANDAGES/DRESSINGS) ×1
APL PRP STRL LF DISP 70% ISPRP (MISCELLANEOUS) ×1
BAG COUNTER SPONGE SURGICOUNT (BAG) ×1 IMPLANT
BAG SPEC RTRVL 10 TROC 200 (ENDOMECHANICALS) ×1
BAG SPNG CNTER NS LX DISP (BAG) ×1
BLADE CLIPPER SURG (BLADE) IMPLANT
CANISTER SUCT 3000ML PPV (MISCELLANEOUS) ×1 IMPLANT
CHLORAPREP W/TINT 26 (MISCELLANEOUS) ×1 IMPLANT
CLIP LIGATING HEMO LOK XL GOLD (MISCELLANEOUS) IMPLANT
CLIP LIGATING HEMO O LOK GREEN (MISCELLANEOUS) ×1 IMPLANT
COVER SURGICAL LIGHT HANDLE (MISCELLANEOUS) ×1 IMPLANT
DERMABOND ADVANCED .7 DNX12 (GAUZE/BANDAGES/DRESSINGS) ×1 IMPLANT
DERMABOND ADVANCED .7 DNX6 (GAUZE/BANDAGES/DRESSINGS) IMPLANT
ELECT REM PT RETURN 9FT ADLT (ELECTROSURGICAL) ×1
ELECTRODE REM PT RTRN 9FT ADLT (ELECTROSURGICAL) ×1 IMPLANT
GLOVE BIOGEL PI IND STRL 7.0 (GLOVE) ×1 IMPLANT
GLOVE SURG SS PI 7.0 STRL IVOR (GLOVE) ×1 IMPLANT
GOWN STRL REUS W/ TWL LRG LVL3 (GOWN DISPOSABLE) ×3 IMPLANT
GOWN STRL REUS W/TWL LRG LVL3 (GOWN DISPOSABLE) ×3
GRASPER SUT TROCAR 14GX15 (MISCELLANEOUS) ×1 IMPLANT
HEMOSTAT SNOW SURGICEL 2X4 (HEMOSTASIS) IMPLANT
IRRIG SUCT STRYKERFLOW 2 WTIP (MISCELLANEOUS) ×1
IRRIGATION SUCT STRKRFLW 2 WTP (MISCELLANEOUS) ×1 IMPLANT
KIT BASIN OR (CUSTOM PROCEDURE TRAY) ×1 IMPLANT
KIT IMAGING PINPOINTPAQ (MISCELLANEOUS) IMPLANT
KIT TURNOVER KIT B (KITS) ×1 IMPLANT
NDL 22X1.5 STRL (OR ONLY) (MISCELLANEOUS) ×1 IMPLANT
NEEDLE 22X1.5 STRL (OR ONLY) (MISCELLANEOUS) ×1
NS IRRIG 1000ML POUR BTL (IV SOLUTION) ×1 IMPLANT
PAD ARMBOARD 7.5X6 YLW CONV (MISCELLANEOUS) ×1 IMPLANT
POUCH RETRIEVAL ECOSAC 10 (ENDOMECHANICALS) ×1 IMPLANT
SCISSORS LAP 5X35 DISP (ENDOMECHANICALS) ×1 IMPLANT
SET TUBE SMOKE EVAC HIGH FLOW (TUBING) ×1 IMPLANT
SLEEVE Z-THREAD 5X100MM (TROCAR) ×2 IMPLANT
SPECIMEN JAR SMALL (MISCELLANEOUS) ×1 IMPLANT
SUT MNCRL AB 4-0 PS2 18 (SUTURE) ×1 IMPLANT
TOWEL GREEN STERILE (TOWEL DISPOSABLE) ×1 IMPLANT
TOWEL GREEN STERILE FF (TOWEL DISPOSABLE) ×1 IMPLANT
TRAY LAPAROSCOPIC MC (CUSTOM PROCEDURE TRAY) ×1 IMPLANT
TROCAR Z THREAD OPTICAL 12X100 (TROCAR) ×1 IMPLANT
TROCAR Z-THREAD OPTICAL 5X100M (TROCAR) ×1 IMPLANT
WARMER LAPAROSCOPE (MISCELLANEOUS) ×1 IMPLANT
WATER STERILE IRR 1000ML POUR (IV SOLUTION) ×1 IMPLANT

## 2023-09-23 NOTE — Anesthesia Preprocedure Evaluation (Signed)
Anesthesia Evaluation  Patient identified by MRN, date of birth, ID band Patient awake    Reviewed: Allergy & Precautions, NPO status , Patient's Chart, lab work & pertinent test results  Airway Mallampati: III  TM Distance: >3 FB Neck ROM: Full  Mouth opening: Limited Mouth Opening  Dental  (+) Partial Upper, Dental Advisory Given,    Pulmonary neg shortness of breath, neg sleep apnea, neg COPD, neg recent URI   breath sounds clear to auscultation       Cardiovascular hypertension, Pt. on medications and Pt. on home beta blockers (-) angina + Peripheral Vascular Disease  (-) Past MI  Rhythm:Regular   1. Left ventricular ejection fraction, by estimation, is 55 to 60%. The  left ventricle has normal function. The left ventricle has no regional  wall motion abnormalities. There is mild left ventricular hypertrophy.  Left ventricular diastolic parameters  are indeterminate.   2. Right ventricular systolic function is normal. The right ventricular  size is normal. There is moderately elevated pulmonary artery systolic  pressure. The estimated right ventricular systolic pressure is 46.4 mmHg.   3. The mitral valve is grossly normal. Mild mitral valve regurgitation.  No evidence of mitral stenosis.   4. The aortic valve is tricuspid. There is mild calcification of the  aortic valve. Aortic valve regurgitation is not visualized. No aortic  stenosis is present.   5. The inferior vena cava is dilated in size with <50% respiratory  variability, suggesting right atrial pressure of 15 mmHg.   6. Ascending aorta measurements are within normal limits for age when  indexed to body surface area.     Neuro/Psych  PSYCHIATRIC DISORDERS Anxiety Depression       GI/Hepatic   Endo/Other  diabetes, Insulin Dependent  Lab Results      Component                Value               Date                      HGBA1C                   7.7 (H)              04/15/2023             Renal/GU ESRF and DialysisRenal diseaseLab Results      Component                Value               Date                      NA                       135                 09/23/2023                K                        4.6                 09/23/2023                CO2  26                  06/05/2023                GLUCOSE                  126 (H)             09/23/2023                BUN                      27 (H)              09/23/2023                CREATININE               2.20 (H)            09/23/2023                CALCIUM                  9.5                 06/05/2023                EGFR                     20 (L)              04/12/2023                GFRNONAA                 8 (L)               06/05/2023                Musculoskeletal   Abdominal   Peds  Hematology Lab Results      Component                Value               Date                      WBC                      9.1                 06/05/2023                HGB                      12.9                09/23/2023                HCT                      38.0                09/23/2023                MCV                      90.7  06/05/2023                PLT                      350                 06/05/2023              Anesthesia Other Findings   Reproductive/Obstetrics                              Anesthesia Physical Anesthesia Plan  ASA: 3  Anesthesia Plan: General   Post-op Pain Management: Tylenol PO (pre-op)*   Induction: Intravenous  PONV Risk Score and Plan: 3 and Ondansetron and Dexamethasone  Airway Management Planned: Oral ETT  Additional Equipment: None  Intra-op Plan:   Post-operative Plan: Extubation in OR  Informed Consent: I have reviewed the patients History and Physical, chart, labs and discussed the procedure including the risks, benefits and alternatives for the proposed  anesthesia with the patient or authorized representative who has indicated his/her understanding and acceptance.     Dental advisory given  Plan Discussed with: CRNA  Anesthesia Plan Comments:          Anesthesia Quick Evaluation

## 2023-09-23 NOTE — Anesthesia Procedure Notes (Signed)
Procedure Name: Intubation Date/Time: 09/23/2023 12:10 PM  Performed by: Georgianne Fick D, CRNAPre-anesthesia Checklist: Patient identified, Emergency Drugs available, Suction available and Patient being monitored Patient Re-evaluated:Patient Re-evaluated prior to induction Oxygen Delivery Method: Circle System Utilized Preoxygenation: Pre-oxygenation with 100% oxygen Induction Type: IV induction Ventilation: Mask ventilation without difficulty Laryngoscope Size: Mac and 3 Grade View: Grade I Tube type: Oral Tube size: 7.0 mm Number of attempts: 1 Airway Equipment and Method: Stylet and Oral airway Placement Confirmation: ETT inserted through vocal cords under direct vision, positive ETCO2 and breath sounds checked- equal and bilateral Secured at: 20 cm Tube secured with: Tape Dental Injury: Teeth and Oropharynx as per pre-operative assessment

## 2023-09-23 NOTE — Op Note (Signed)
PATIENT:  Samantha [name]  72 y.o. female  PRE-OPERATIVE DIAGNOSIS:  GALLSTONES  POST-OPERATIVE DIAGNOSIS:  GALLSTONES  PROCEDURE:  Procedure(s): LAPAROSCOPIC CHOLECYSTECTOMY   SURGEON:  Myrtha Tonkovich, De Blanch, MD   ASSISTANT: none  ANESTHESIA:   local and general  Indications for procedure: [name] is a 72 y.o. female with symptoms of Abdominal pain consistent with gallbladder disease. She had a drain placed and now presents for interval cholecystectomy.  Description of procedure: The patient was brought into the operative suite, placed supine. Anesthesia was administered with endotracheal tube. Patient was strapped in place and foot board was secured. All pressure points were offloaded by foam padding. The patient was prepped and draped in the usual sterile fashion.  A periumbilical incision was made and optical entry was used to enter the abdomen. 2 5 mm trocars were placed on in the right lateral space on in the right subcostal space. A 12mm trocar was placed in the subxiphoid space. Marcaine was infused to the subxiphoid space and lateral upper right abdomen in the transversus abdominis plane. Next the patient was placed in reverse trendelenberg. The gallbladder appearedchronically inflamed. Omentum was adhered to the gallbladder and was taken down with cautery/blunt dissection.  The gallbladder was retracted cephalad and lateral. The peritoneum was reflected off the infundibulum working lateral to medial. The cystic duct and cystic artery were identified and further dissection revealed a critical view. The cystic duct and cystic artery were doubly clipped and ligated.   The gallbladder was removed off the liver bed with cautery. The Gallbladder was placed in a specimen bag. The gallbladder fossa was irrigated and hemostasis was applied with cautery. The gallbladder was removed via the 12mm trocar. The fascial defect was closed with interrupted 0 vicryl suture via  laparoscopic trans-fascial suture passer, loose stones were removed, and surgicel was placed into the gallbladder fossa . Pneumoperitoneum was removed, all trocar were removed. All incisions were closed with 4-0 monocryl subcuticular stitch. The patient woke from anesthesia and was brought to PACU in stable condition. All counts were correct  Findings: chronic cholecystitis  Specimen: gallbladder  Blood loss: 100 ml  Local anesthesia: 20 ml Marcaine  Complications: none  PLAN OF CARE: Discharge to home after PACU  PATIENT DISPOSITION:  PACU - hemodynamically stable.  De Blanch [facility], [place]

## 2023-09-23 NOTE — H&P (Signed)
Chief Complaint: Discuss gallbladder   History of Present Illness: Samantha [name] is a 72 y.o. female who is seen today for gallbladder drain follow up.  She has no new pains. She has been stable on dialysis and has not had any new neuro issues.  Review of Systems: A complete review of systems was obtained from the patient. I have reviewed this information and discussed as appropriate with the patient. See HPI as well for other ROS.  Review of Systems  Constitutional: Negative.  HENT: Negative.  Eyes: Negative.  Respiratory: Negative.  Cardiovascular: Negative.  Gastrointestinal: Negative.  Genitourinary: Negative.  Musculoskeletal: Negative.  Skin: Negative.  Neurological: Negative.  Endo/Heme/Allergies: Negative.  Psychiatric/Behavioral: Negative.    Medical History: Past Medical History:  Diagnosis Date  Arthritis  Chronic kidney disease  Diabetes mellitus without complication (CMS/HHS-HCC)  History of stroke  Hyperlipidemia  Hypertension   There is no problem list on file for this patient.  Past Surgical History:  Procedure Laterality Date  CHOLECYSTECTOMY 05/23/2023    Allergies  Allergen Reactions  Perflutren Other (See Comments)  Pt with back pain as soon as administered.- patient refuted this in 2024   Current Outpatient Medications on File Prior to Visit  Medication Sig Dispense Refill  ergocalciferol, vitamin D2, 1,250 mcg (50,000 unit) capsule Take 50,000 Units by mouth every 7 (seven) days  FUROsemide (LASIX) 40 MG tablet Take 1 tablet by mouth once daily  insulin DETEMIR (LEVEMIR FLEXTOUCH) pen injector (concentration 100 units/mL) Inject subcutaneously  lisinopriL (ZESTRIL) 2.5 MG tablet Take 2.5 mg by mouth once daily  RENVELA 800 mg tablet Take 1 tablet by mouth 3 (three) times daily with meals  atorvastatin (LIPITOR) 80 MG tablet Take 80 mg by mouth once daily  carvediloL (COREG) 6.25 MG tablet Take 6.25 mg by mouth 2 (two) times daily with  meals  clopidogreL (PLAVIX) 75 mg tablet Take 75 mg by mouth once daily  ondansetron (ZOFRAN) 4 MG tablet Take 4 mg by mouth every 6 (six) hours as needed   No current facility-administered medications on file prior to visit.   History reviewed. No pertinent family history.   Social History   Tobacco Use  Smoking Status Never  Smokeless Tobacco Never    Social History   Socioeconomic History  Marital status: Widowed  Tobacco Use  Smoking status: Never  Smokeless tobacco: Never  Substance and Sexual Activity  Alcohol use: Not Currently  Drug use: Never   Social Determinants of Health   Food Insecurity: No Food Insecurity (05/05/2023)  [facility]  Hunger Vital Sign  Worried About Running Out of Food in the Last Year: Never true  Ran Out of Food in the Last Year: Never true  Transportation Needs: No Transportation Needs (05/05/2023)  Received from [facility] - Transportation  Lack of Transportation (Medical): No  Lack of Transportation (Non-Medical): No  Housing Stability: Low Risk (07/02/2023)  Received from [employer] Stability Vital Sign  What is your living situation today?: I have a steady place to live   Objective:   Vitals:  09/01/23 1111  Pulse: 85  Temp: 36.4 C (97.5 F)  SpO2: 98%  Weight: 59.4 kg (131 lb)  Height: 157.5 cm (5\' 2" )  PainSc: 0-No pain   Body mass index is 23.96 kg/m.  Physical Exam Constitutional:  Appearance: Normal appearance.  HENT:  Head: Normocephalic and atraumatic.  Pulmonary:  Effort: Pulmonary effort is normal.  Abdominal:  Comments: Drain with bilious  output  Musculoskeletal:  General: Normal range of motion.  Cervical back: Normal range of motion.  Neurological:  General: No focal deficit present.  Mental Status: She is alert and oriented to person, place, and time. Mental status is at baseline.  Psychiatric:  Mood and Affect: Mood normal.  Behavior: Behavior normal.  Thought  Content: Thought content normal.   Labs, Imaging and Diagnostic Testing:  I reviewed images of the drain study showing some gallstones as well as a patent cystic duct.  Assessment and Plan:   There are no diagnoses linked to this encounter.   We discussed options of drain removal vs surgery. She would rather proceed with surgery.  We discussed the etiology of gallstones and probably cause pain. We discussed exacerbating factors including fatty meals. We discussed the details of surgery for removal of the gallbladder including general anesthesia, 4 small incisions in the patient's abdomen, removal of the patient's gallbladder with the liver and common bile duct, and most likely outpatient procedure. We discussed risks of common bile duct injury, cystic duct stump leak, injury to liver, bleeding, infection, need for open procedure, and post cholecystectomy syndrome. The patient showed good understanding and wanted to proceed with laparoscopic cholecystectomy.   We will consult with her PCP for medical clearance given her encephalopathy and stroke earlier this year as well as new ESRD status.

## 2023-09-23 NOTE — Transfer of Care (Signed)
Immediate Anesthesia Transfer of Care Note  Patient: Samantha [name]  Procedure(s) Performed: LAPAROSCOPIC CHOLECYSTECTOMY  Patient Location: PACU  Anesthesia Type:General  Level of Consciousness: awake, alert , and oriented  Airway & Oxygen Therapy: Patient Spontanous Breathing  Post-op Assessment: Report given to RN and Post -op Vital signs reviewed and stable  Post vital signs: Reviewed and stable  Last Vitals:  Vitals Value Taken Time  BP 191/70 09/23/23 1328  Temp    Pulse 79 09/23/23 1329  Resp 19 09/23/23 1329  SpO2 94 % 09/23/23 1329  Vitals shown include unfiled device data.  Last Pain:  Vitals:   09/23/23 1014  TempSrc:   PainSc: 0-No pain         Complications: No notable events documented.

## 2023-09-24 ENCOUNTER — Encounter (HOSPITAL_COMMUNITY): Payer: Self-pay | Admitting: General Surgery

## 2023-09-24 LAB — SURGICAL PATHOLOGY

## 2023-09-24 NOTE — Anesthesia Postprocedure Evaluation (Signed)
Anesthesia Post Note  Patient: Samantha [name]  Procedure(s) Performed: LAPAROSCOPIC CHOLECYSTECTOMY     Patient location during evaluation: PACU Anesthesia Type: General Level of consciousness: awake and alert Pain management: pain level controlled Vital Signs Assessment: post-procedure vital signs reviewed and stable Respiratory status: spontaneous breathing, nonlabored ventilation, respiratory function stable and patient connected to nasal cannula oxygen Cardiovascular status: blood pressure returned to baseline and stable Anesthetic complications: no   No notable events documented.  Last Vitals:  Vitals:   09/23/23 1530 09/23/23 1545  BP: (!) 161/64 (!) 173/70  Pulse: 81 83  Resp: 17 17  Temp:  36.6 C  SpO2: 95% 97%    Last Pain:  Vitals:   09/23/23 1545  TempSrc:   PainSc: 0-No pain                 Haidy Kackley

## 2023-09-27 ENCOUNTER — Ambulatory Visit (HOSPITAL_COMMUNITY)
Admission: RE | Admit: 2023-09-27 | Discharge: 2023-09-27 | Disposition: A | Discharge status: Home / Self Care | Payer: Medicaid Other | Source: Ambulatory Visit | Attending: Radiology | Admitting: Radiology

## 2023-09-27 DIAGNOSIS — K8020 Calculus of gallbladder without cholecystitis without obstruction: Secondary | ICD-10-CM

## 2023-10-12 ENCOUNTER — Other Ambulatory Visit: Payer: Self-pay | Admitting: Nurse Practitioner

## 2023-10-12 ENCOUNTER — Other Ambulatory Visit: Payer: Self-pay | Admitting: Physician Assistant

## 2023-10-12 DIAGNOSIS — I63442 Cerebral infarction due to embolism of left cerebellar artery: Secondary | ICD-10-CM

## 2023-10-12 DIAGNOSIS — Z992 Dependence on renal dialysis: Secondary | ICD-10-CM

## 2023-10-13 ENCOUNTER — Other Ambulatory Visit: Payer: Self-pay | Admitting: Nurse Practitioner

## 2023-10-13 DIAGNOSIS — N186 End stage renal disease: Secondary | ICD-10-CM

## 2023-10-13 MED ORDER — SEVELAMER CARBONATE 800 MG PO TABS
800.0000 mg | ORAL_TABLET | Freq: Three times a day (TID) | ORAL | 2 refills | Status: DC
Start: 2023-10-13 — End: 2023-11-29

## 2023-10-13 NOTE — Telephone Encounter (Signed)
Please advise Kh 

## 2023-10-13 NOTE — Telephone Encounter (Signed)
Please advise KH 

## 2023-11-23 ENCOUNTER — Other Ambulatory Visit: Payer: Self-pay | Admitting: Physician Assistant

## 2023-11-23 ENCOUNTER — Other Ambulatory Visit: Payer: Self-pay | Admitting: Nurse Practitioner

## 2023-11-23 DIAGNOSIS — Z992 Dependence on renal dialysis: Secondary | ICD-10-CM

## 2023-11-23 DIAGNOSIS — I63442 Cerebral infarction due to embolism of left cerebellar artery: Secondary | ICD-10-CM

## 2023-11-26 ENCOUNTER — Other Ambulatory Visit: Payer: Self-pay | Admitting: Nurse Practitioner

## 2023-11-26 DIAGNOSIS — E118 Type 2 diabetes mellitus with unspecified complications: Secondary | ICD-10-CM

## 2023-11-28 ENCOUNTER — Other Ambulatory Visit: Payer: Self-pay | Admitting: Nurse Practitioner

## 2023-11-28 DIAGNOSIS — N186 End stage renal disease: Secondary | ICD-10-CM

## 2023-11-29 ENCOUNTER — Other Ambulatory Visit (HOSPITAL_COMMUNITY): Payer: Self-pay | Admitting: Nurse Practitioner

## 2023-11-29 DIAGNOSIS — E118 Type 2 diabetes mellitus with unspecified complications: Secondary | ICD-10-CM

## 2023-11-29 MED ORDER — INSULIN GLARGINE 100 UNIT/ML SOLOSTAR PEN: 15.0000 [IU] | PEN_INJECTOR | Freq: Every day | SUBCUTANEOUS | 3 refills | Status: DC

## 2024-01-27 NOTE — Progress Notes (Addendum)
 [facility] - Clinic Note  02/02/2024   CHIEF COMPLAINT Patient presents for Retina Evaluation  HISTORY OF PRESENT ILLNESS: Samantha [name] is a 73 y.o. female who presents to the clinic today for:  HPI     Retina Evaluation   In both eyes.  I, the attending physician,  performed the HPI with the patient and updated documentation appropriately.        Comments   Retina eval DM exam pt is reporting that she was having injection with Dr Tobie last injection few weeks ago IVA OS  pt was suppose to have PRP there today but they no longer take her insurance pt states vision in right eye is dark she has had some flashes her last reading  207 she had cataract surgery last month with Dr Octavia OS she has dialysis T,Thurs, Sat       Last edited by Valdemar Rogue, MD on 02/02/2024  2:07 PM.    Pt is a previous pt of Dr. Anthony, pt was receiving injections with him every month in the left eye only, she states she has glaucoma in her right eye and has no vision in that eye, pt states she was in the hospital in May and during that time her eye got red and painful, but no one would listen to her, she states by the time she got to the dr it was too late and her vision was gone, she states she has had a total of 4 injections in the left eye, pt is not using any drops    Referring physician: Paseda, Folashade R, FNP [phone] [address] [place],  [place] [address]  HISTORICAL INFORMATION:  Selected notes from the MEDICAL RECORD NUMBER Referred by [facility]: Dec 08, 2023 at [facility]x- previously managed by [facility] -- h/o anti-VEGF therapy OS x4, last injection was IVE OS on 12.11.24 PMH-   CURRENT MEDICATIONS: Current Outpatient Medications (Ophthalmic Drugs)  Medication Sig   brimonidine  (ALPHAGAN ) 0.2 % ophthalmic solution Place 1 drop into the right eye 2 (two) times daily.   dorzolamide -timolol  (COSOPT ) 2-0.5 % ophthalmic  solution Place 1 drop into the right eye 2 (two) times daily.   ciprofloxacin  (CILOXAN ) 0.3 % ophthalmic solution Administer 2 drop into the right eye four times daily for  7 days (Patient not taking: Reported on 09/15/2023)   ofloxacin (OCUFLOX) 0.3 % ophthalmic solution Place 1 drop into the left eye 4 (four) times daily.   No current facility-administered medications for this visit. (Ophthalmic Drugs)   Current Outpatient Medications (Other)  Medication Sig   acetaminophen  (TYLENOL ) 325 MG tablet Take 1-2 tablets (325-650 mg total) by mouth every 4 (four) hours as needed for mild pain.   atorvastatin  (LIPITOR ) 80 MG tablet Take 1 tablet (80 mg total) by mouth daily. (Patient taking differently: Take 40 mg by mouth daily.)   carvedilol  (COREG ) 6.25 MG tablet Take 1 tablet (6.25 mg total) by mouth 2 (two) times daily with a meal.   clopidogrel  (PLAVIX ) 75 MG tablet TAKE 1 TABLET BY MOUTH EVERY DAY   insulin  glargine (LANTUS ) 100 UNIT/ML Solostar Pen Inject 15 Units into the skin daily.   lisinopril  (ZESTRIL ) 2.5 MG tablet Take 2.5 mg by mouth daily.   Multiple Vitamin (MULTIVITAMIN) capsule Take 1 capsule by mouth daily.   ondansetron  (ZOFRAN ) 4 MG tablet Take 1 tablet (4 mg total) by mouth every 6 (six) hours as needed  for nausea. (Patient not taking: Reported on 09/15/2023)   oxyCODONE  (OXY IR/ROXICODONE ) 5 MG immediate release tablet Take 1 tablet (5 mg total) by mouth every 6 (six) hours as needed for severe pain.   Semaglutide (OZEMPIC, 0.25 OR 0.5 MG/DOSE, [place]) Inject 0.25 mg into the skin once a week.   sevelamer  carbonate (RENVELA ) 800 MG tablet TAKE 1 TABLET BY MOUTH 3 TIMES DAILY WITH MEALS.   sodium chloride  flush 0.9 % SOLN injection Flush cholecystectomy tube as directed by general surgery   No current facility-administered medications for this visit. (Other)   REVIEW OF SYSTEMS: ROS   Positive for: Endocrine, Eyes Negative for: Constitutional, Gastrointestinal, Neurological,  Skin, Genitourinary, Musculoskeletal, HENT, Cardiovascular, Respiratory, Psychiatric, Allergic/Imm, Heme/Lymph Last edited by Verneda Auston BIRCH, COT on 02/02/2024  2:27 PM.     ALLERGIES Allergies  Allergen Reactions   Chicken Allergy Other (See Comments)    headache   Perflutren  Other (See Comments)    Pt with back pain as soon as administered.- patient refuted this in 2024   PAST MEDICAL HISTORY Past Medical History:  Diagnosis Date   Acute pyelonephritis    Bacteremia, escherichia coli 01/12/2015   Chest pain    Critical lower limb ischemia (HCC)    Hyperlipidemia    Hypertension    Left carotid bruit    PAD (peripheral artery disease) (HCC)    Type II diabetes mellitus (HCC)    Past Surgical History:  Procedure Laterality Date   ABDOMINAL ANGIOGRAM  05/16/2015   Procedure: Abdominal Angiogram;  Surgeon: Dorn JINNY Lesches, MD;  Location: [facility]ASIVE [facility];  Service: Cardiovascular;;   ABDOMINAL HYSTERECTOMY  ~ 2000   CHOLECYSTECTOMY N/A 09/23/2023   Procedure: LAPAROSCOPIC CHOLECYSTECTOMY;  Surgeon: Kinsinger, Herlene Righter, MD;  Location: [facility] OR;  Service: General;  Laterality: N/A;   IR EXCHANGE BILIARY DRAIN  07/26/2023   IR FLUORO GUIDE CV LINE RIGHT  05/17/2023   IR PERC CHOLECYSTOSTOMY  05/23/2023   IR US  GUIDE VASC ACCESS RIGHT  05/17/2023   PERIPHERAL VASCULAR CATHETERIZATION Bilateral 05/16/2015   Procedure: Lower Extremity Angiography;  Surgeon: Dorn JINNY Lesches, MD; renal arteries widely patent, L-SFA 75%, L-pop 95%, L-peroneal 90%; R-SFA 40%, R-pop 50%, R-prox peroneal 90%; directional atherectomy L-SFA, p-pop, drug-eluting balloon angioplasty reducing the stenoses to 0, small linear dissection was not flow limiting       PERIPHERAL VASCULAR CATHETERIZATION Left 05/16/2015   Procedure: Peripheral Vascular Atherectomy;  Surgeon: Dorn JINNY Lesches, MD;  Location: [facility];  Service: Cardiovascular;  Laterality: Left;  sfa   FAMILY HISTORY Family History  Problem  Relation Age of Onset   Hypertension Mother    SOCIAL HISTORY Social History   Tobacco Use   Smoking status: Never   Smokeless tobacco: Never  Substance Use Topics   Alcohol  use: Not Currently    Alcohol /week: 1.0 - 2.0 standard drink of alcohol     Types: 1 - 2 Cans of beer per week    Comment: occas   Drug use: No       OPHTHALMIC EXAM:  Base Eye Exam     Visual Acuity (Snellen - Linear)       Right Left   Dist [place] HM 20/30 -2   Dist ph [place] NI 20/25 -2         Tonometry (Tonopen, 1:36 PM)       Right Left   Pressure 24 14  Com/brim         Gonioscopy  OD: PAS 360, closed angle 360        Pupils       Pupils Dark Light Shape React APD   Right PERRL 5 5 Round NR None   Left PERRL 3 2 Round Brisk None         Visual Fields       Left Right    Full    Restrictions  Total superior temporal, inferior temporal, superior nasal, inferior nasal deficiencies         Neuro/Psych     Oriented x3: Yes   Mood/Affect: Normal         Dilation     Both eyes: 2.5% Phenylephrine @ 1:37 PM           Slit Lamp and Fundus Exam     Slit Lamp Exam       Right Left   Lids/Lashes Dermatochalasis - upper lid Dermatochalasis - upper lid   Conjunctiva/Sclera nasal and temporal pinguecula nasal and temporal pinguecula, mild melanosis   Cornea trace PEE, mild tear film debris well healed cataract wound   Anterior Chamber deep and clear deep and clear   Iris Round and dilated, 360 NVI Round and dilated, No NVI   Lens 3-4+ Nuclear sclerosis, 3-4+ Cortical cataract, 1+ Posterior subcapsular cataract PC IOL in good position, trace posterior capsular opacification   Anterior Vitreous Vitreous syneresis Vitreous syneresis         Fundus Exam       Right Left   Disc hazy view, 3-4+Pallor, severely attenuated vessels, +cupping 2+ mild Pallor, Sharp rim, mild PPA   C/D Ratio 0.9 0.3   Macula hazy view, grossly flat, no details visible Flat, Good foveal  reflex, scattered MA greatest temporal, +cystic changes   Vessels Severe attenuation attenuated, Tortuous   Periphery hazy view, Attached Attached           IMAGING AND PROCEDURES  Imaging and Procedures for 02/02/2024  OCT, Retina - OU - Both Eyes       Right Eye Quality was poor. Progression has no prior data. Findings include normal foveal contour, no IRF, no SRF, epiretinal membrane, outer retinal atrophy (Grainy images, retina attached, diffuse atrophy, no obvious IRF/SRF).   Left Eye Quality was good. Central Foveal Thickness: 302. Progression has no prior data. Findings include normal foveal contour, no SRF, intraretinal hyper-reflective material, intraretinal fluid (Mild IRF/edema temporal macula).   Notes *Images captured and stored on drive  Diagnosis / Impression:  OD: Grainy images, retina attached, diffuse atrophy, no obvious IRF/SRF - no DME OS: Mild IRF/edema temporal macula -- +DME  Clinical management:  See below  Abbreviations: NFP - Normal foveal profile. CME - cystoid macular edema. PED - pigment epithelial detachment. IRF - intraretinal fluid. SRF - subretinal fluid. EZ - ellipsoid zone. ERM - epiretinal membrane. ORA - outer retinal atrophy. ORT - outer retinal tubulation. SRHM - subretinal hyper-reflective material. IRHM - intraretinal hyper-reflective material      Fluorescein  Angiography Optos (Transit OS)       Right Eye Progression has no prior data. Early phase findings include (No images obtained).   Left Eye Progression has no prior data. Early phase findings include microaneurysm, vascular perfusion defect. Mid/Late phase findings include leakage, microaneurysm, vascular perfusion defect (No NV).   Notes **Images stored on drive**  Impression: OD: no images obtained OS: severe NPDR -- no NV; scattered patches of vascular nonperfusion; scattered late leaking MA      Intravitreal  Injection, Pharmacologic Agent - OD - Right Eye        Time Out 02/02/2024. 3:32 PM. Confirmed correct patient, procedure, site, and patient consented.   Anesthesia Topical anesthesia was used. Anesthetic medications included Lidocaine  2%, Proparacaine 0.5%.   Procedure Preparation included 5% betadine to ocular surface, eyelid speculum. A supplied needle was used.   Injection: 1.25 mg Bevacizumab  1.25mg /0.23ml   Route: Intravitreal, Site: Right Eye   NDC: H525437, Lot: [id number], Expiration date: 02/25/2024   Post-op Post injection exam found visual acuity of at least counting fingers. The patient tolerated the procedure well. There were no complications. The patient received written and verbal post procedure care education.   Notes An AC tap was performed following injection due to elevated IOP using a 30 gauge needle on a syringe with the plunger removed. The needle was placed at the limbus at 7 oclock and approximately 0.06cc of aqueous was removed from the anterior chamber. Betadine was applied to the tap area before and after the paracentesis was performed. There were no complications. The patient tolerated the procedure well. The IOP was rechecked and was found to be ~10 mmHg by digital palpation.      Intravitreal Injection, Pharmacologic Agent - OS - Left Eye       Time Out 02/02/2024. 3:33 PM. Confirmed correct patient, procedure, site, and patient consented.   Anesthesia Topical anesthesia was used. Anesthetic medications included Lidocaine  2%, Proparacaine 0.5%.   Procedure Preparation included 5% betadine to ocular surface, eyelid speculum. A supplied needle was used.   Injection: 1.25 mg Bevacizumab  1.25mg /0.50ml   Route: Intravitreal, Site: Left Eye   NDC: H525437, Lot: [id number], Expiration date: 03/14/2024   Post-op Post injection exam found visual acuity of at least counting fingers. The patient tolerated the procedure well. There were no complications. The patient received written and verbal post procedure  care education.           ASSESSMENT/PLAN:   ICD-10-CM   1. 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 Neovascular glaucoma of right eye, severe stage  H40.51X3 Intravitreal Injection, Pharmacologic Agent - OD - Right Eye    Bevacizumab  (AVASTIN ) SOLN 1.25 mg    4. Rubeosis iridis of right eye  H21.1X1 Intravitreal Injection, Pharmacologic Agent - OD - Right Eye    Bevacizumab  (AVASTIN ) SOLN 1.25 mg    5. Current use of insulin  (HCC)  Z79.4     6. Long-term (current) use of injectable non-insulin  antidiabetic drugs  Z79.85     7. Hypertensive retinopathy of both eyes  H35.033 Fluorescein  Angiography Optos (Transit OS)    8. Essential hypertension  I10     9. Combined forms of age-related cataract of right eye  H25.811     10. Pseudophakia  Z96.1      1-6. Proliferative diabetic retinopathy OD        Severe NPDR OS  - previously managed at [facility] -- referred here by [facility] due to insurance  - h/o IVE OS  x4, last injection 12.11.24 - The incidence, risk factors for progression, natural history and treatment options for diabetic retinopathy were discussed with patient.   - The need for close monitoring of blood glucose, blood pressure, and serum lipids, avoiding cigarette or any type of tobacco, and the need for long term follow up was also discussed with patient. - exam shows +NVI w/ closed angles OD, IOP OD 24 -- +neovascular glaucoma; OS with scattered MA/DBH, no NV - FA (02.05.25) OD: no images obtained  (dense cataract); OS: no NV; scattered patches of vascular nonperfusion; scattered late leaking MA -- would likely benefit from PRP OS to areas of vascular nonperfusion - OCT OD: no images obtained; OS w/ +IRF/IRHM temporal macula, +DME - The natural history, pathology, and characteristics of diabetic macular edema discussed with patient.  A generalized discussion of the major clinical trials concerning treatment of diabetic macular edema (ETDRS, DCT, SCORE, RISE / RIDE, and ongoing DRCR net studies) was completed.  This discussion included mention of the various approaches to treating diabetic macular edema (observation, laser photocoagulation, anti-VEGF injections with lucentis / Avastin  / Eylea , steroid injections with Kenalog / Ozurdex , and intraocular surgery with vitrectomy).  The goal hemoglobin A1C of 6-7 was discussed, as well as importance of smoking cessation and hypertension control.  Need for ongoing treatment and monitoring were specifically discussed with reference to chronic nature of diabetic macular edema. - BCVA OD HM, OS 20/25 - IOP OD 24, OS 14 - recommend IVA OU #1 today, 02.05.25 -- OD for NVI / neovascular glaucoma; OS for DME - pt wishes to proceed with injections - RBA of procedure discussed, questions answered - IVA informed consent obtained and signed, 02.05.25 (OU) - see procedure note - recommend starting Cosopt  and Brimonidine  BID OD for NVG - f/u in 4 wks (~03/01/24) -- DFE/OCT, possible injection(s) - f/u next Wednesday, February 12th -- DFE/OCT, PRP OS  7,8. Hypertensive retinopathy OU - discussed importance of tight BP control - monitor  9. Mixed Cataract OD - The symptoms of cataract, surgical options, and treatments and risks were discussed with patient. - discussed diagnosis and progression - under the expert management of Dr. Octavia  10. Pseudophakia OS  - s/p CE/IOL OS (January 2025, Dr. Octavia)  - IOL in good position, doing well  -  monitor   Ophthalmic Meds Ordered this visit:  Meds ordered this encounter  Medications   brimonidine  (ALPHAGAN ) 0.2 % ophthalmic solution    Sig: Place 1 drop into the right eye 2 (two) times daily.    Dispense:  10 mL    Refill:  3   dorzolamide -timolol  (COSOPT ) 2-0.5 % ophthalmic solution    Sig: Place 1 drop into the right eye 2 (two) times daily.    Dispense:  10 mL    Refill:  5   Bevacizumab  (AVASTIN ) SOLN 1.25 mg   Bevacizumab  (AVASTIN ) SOLN 1.25 mg     Return in about 1 week (around 02/09/2024) for f/u NPDR OU, DFE, OCT, PRP OS.  There are no Patient Instructions on file for this visit.  Explained the diagnoses, plan, and follow up with the patient and they expressed understanding.  Patient expressed understanding of the importance of proper follow up care.   This document serves as a record of services personally performed by Redell JUDITHANN Hans, MD, PhD. It was created on their behalf by Alan PARAS. Delores, OA an ophthalmic technician. The creation of this record is the provider's dictation and/or activities during  the visit.    Electronically signed by: Alan PARAS. Delores, OA 02/02/24 4:58 PM  Redell JUDITHANN Hans, M.D., Ph.D. Diseases & Surgery of the Retina and Vitreous [facility] 02/02/2024  I have reviewed the above documentation for accuracy and completeness, and I agree with the above. Redell JUDITHANN Hans, M.D., Ph.D. 02/02/24 4:58 PM   Abbreviations: M myopia (nearsighted); A astigmatism; H hyperopia (farsighted); P presbyopia; Mrx spectacle prescription;  CTL contact lenses; OD right eye; OS left eye; OU both eyes  XT exotropia; ET esotropia; PEK punctate epithelial keratitis; PEE punctate epithelial erosions; DES dry eye syndrome; MGD meibomian gland dysfunction; ATs artificial tears; PFAT's preservative free artificial tears; NSC nuclear sclerotic cataract; PSC posterior subcapsular cataract; ERM epi-retinal membrane; PVD posterior vitreous detachment; RD  retinal detachment; DM diabetes mellitus; DR diabetic retinopathy; NPDR non-proliferative diabetic retinopathy; PDR proliferative diabetic retinopathy; CSME clinically significant macular edema; DME diabetic macular edema; dbh dot blot hemorrhages; CWS cotton wool spot; POAG primary open angle glaucoma; C/D cup-to-disc ratio; HVF humphrey visual field; GVF goldmann visual field; OCT optical coherence tomography; IOP intraocular pressure; BRVO Branch retinal vein occlusion; CRVO central retinal vein occlusion; CRAO central retinal artery occlusion; BRAO branch retinal artery occlusion; RT retinal tear; SB scleral buckle; PPV pars plana vitrectomy; VH Vitreous hemorrhage; PRP panretinal laser photocoagulation; IVK intravitreal kenalog; VMT vitreomacular traction; MH Macular hole;  NVD neovascularization of the disc; NVE neovascularization elsewhere; AREDS age related eye disease study; ARMD age related macular degeneration; POAG primary open angle glaucoma; EBMD epithelial/anterior basement membrane dystrophy; ACIOL anterior chamber intraocular lens; IOL intraocular lens; PCIOL posterior chamber intraocular lens; Phaco/IOL phacoemulsification with intraocular lens placement; PRK photorefractive keratectomy; LASIK laser assisted in situ keratomileusis; HTN hypertension; DM diabetes mellitus; COPD chronic obstructive pulmonary disease

## 2024-02-02 ENCOUNTER — Encounter (INDEPENDENT_AMBULATORY_CARE_PROVIDER_SITE_OTHER): Payer: Self-pay | Admitting: Ophthalmology

## 2024-02-02 ENCOUNTER — Ambulatory Visit (INDEPENDENT_AMBULATORY_CARE_PROVIDER_SITE_OTHER): Payer: Medicaid Other | Admitting: Ophthalmology

## 2024-02-02 VITALS — BP 189/72 | HR 72

## 2024-02-02 DIAGNOSIS — E113591 Type 2 diabetes mellitus with proliferative diabetic retinopathy without macular edema, right eye: Secondary | ICD-10-CM

## 2024-02-02 DIAGNOSIS — H4051X3 Glaucoma secondary to other eye disorders, right eye, severe stage: Secondary | ICD-10-CM

## 2024-02-02 DIAGNOSIS — I10 Essential (primary) hypertension: Secondary | ICD-10-CM

## 2024-02-02 DIAGNOSIS — Z7985 Long-term (current) use of injectable non-insulin antidiabetic drugs: Secondary | ICD-10-CM

## 2024-02-02 DIAGNOSIS — Z961 Presence of intraocular lens: Secondary | ICD-10-CM

## 2024-02-02 DIAGNOSIS — E113412 Type 2 diabetes mellitus with severe nonproliferative diabetic retinopathy with macular edema, left eye: Secondary | ICD-10-CM | POA: Diagnosis not present

## 2024-02-02 DIAGNOSIS — H3581 Retinal edema: Secondary | ICD-10-CM

## 2024-02-02 DIAGNOSIS — H35033 Hypertensive retinopathy, bilateral: Secondary | ICD-10-CM

## 2024-02-02 DIAGNOSIS — Z794 Long term (current) use of insulin: Secondary | ICD-10-CM | POA: Diagnosis not present

## 2024-02-02 DIAGNOSIS — H211X1 Other vascular disorders of iris and ciliary body, right eye: Secondary | ICD-10-CM

## 2024-02-02 DIAGNOSIS — H25811 Combined forms of age-related cataract, right eye: Secondary | ICD-10-CM

## 2024-02-02 MED ORDER — BEVACIZUMAB CHEMO INJECTION 1.25MG/0.05ML SYRINGE FOR KALEIDOSCOPE
1.2500 mg | INTRAVITREAL | Status: AC | PRN
  Administered 2024-02-02: 1.25 mg via INTRAVITREAL

## 2024-02-02 MED ORDER — BRIMONIDINE TARTRATE 0.2 % OP SOLN: 1.0000 [drp] | Freq: Two times a day (BID) | OPHTHALMIC | 3 refills | Status: DC

## 2024-02-02 MED ORDER — DORZOLAMIDE HCL-TIMOLOL MAL 2-0.5 % OP SOLN: 1.0000 [drp] | Freq: Two times a day (BID) | OPHTHALMIC | 5 refills | Status: DC

## 2024-02-09 ENCOUNTER — Encounter (INDEPENDENT_AMBULATORY_CARE_PROVIDER_SITE_OTHER): Payer: Medicaid Other | Admitting: Ophthalmology

## 2024-02-09 ENCOUNTER — Encounter (INDEPENDENT_AMBULATORY_CARE_PROVIDER_SITE_OTHER): Payer: Self-pay

## 2024-02-09 DIAGNOSIS — H4051X3 Glaucoma secondary to other eye disorders, right eye, severe stage: Secondary | ICD-10-CM

## 2024-02-09 DIAGNOSIS — I10 Essential (primary) hypertension: Secondary | ICD-10-CM

## 2024-02-09 DIAGNOSIS — Z794 Long term (current) use of insulin: Secondary | ICD-10-CM

## 2024-02-09 DIAGNOSIS — Z961 Presence of intraocular lens: Secondary | ICD-10-CM

## 2024-02-09 DIAGNOSIS — H35033 Hypertensive retinopathy, bilateral: Secondary | ICD-10-CM

## 2024-02-09 DIAGNOSIS — E113412 Type 2 diabetes mellitus with severe nonproliferative diabetic retinopathy with macular edema, left eye: Secondary | ICD-10-CM

## 2024-02-09 DIAGNOSIS — Z7985 Long-term (current) use of injectable non-insulin antidiabetic drugs: Secondary | ICD-10-CM

## 2024-02-09 DIAGNOSIS — H25811 Combined forms of age-related cataract, right eye: Secondary | ICD-10-CM

## 2024-02-09 DIAGNOSIS — H211X1 Other vascular disorders of iris and ciliary body, right eye: Secondary | ICD-10-CM

## 2024-02-09 DIAGNOSIS — E113591 Type 2 diabetes mellitus with proliferative diabetic retinopathy without macular edema, right eye: Secondary | ICD-10-CM

## 2024-02-09 NOTE — Progress Notes (Shared)
[facility] - Clinic Note  02/11/2024   CHIEF COMPLAINT Patient presents for No chief complaint on file.  HISTORY OF PRESENT ILLNESS: Samantha [name] is a 73 y.o. female who presents to the clinic today for:   Pt is a previous pt of Dr. 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 [place],  [place] [address]  HISTORICAL INFORMATION:  Selected notes from the MEDICAL RECORD NUMBER Referred by [facility]: Dec 08, 2023 at [facility]x- previously managed by [facility] -- h/o anti-VEGF therapy OS x4, last injection was IVE OS on 12.11.24 PMH-   CURRENT MEDICATIONS: Current Outpatient Medications (Ophthalmic Drugs)  Medication Sig   brimonidine (ALPHAGAN) 0.2 % ophthalmic solution Place 1 drop into the right eye 2 (two) times daily.   ciprofloxacin (CILOXAN) 0.3 % ophthalmic solution Administer 2 drop into the right eye four times daily for  7 days (Patient not taking: Reported on 09/15/2023)   dorzolamide-timolol (COSOPT) 2-0.5 % ophthalmic solution Place 1 drop into the right eye 2 (two) times daily.   ofloxacin (OCUFLOX) 0.3 % ophthalmic solution Place 1 drop into the left eye 4 (four) times daily.   No current facility-administered medications for this visit. (Ophthalmic Drugs)   Current Outpatient Medications (Other)  Medication Sig   acetaminophen (TYLENOL) 325 MG tablet Take 1-2 tablets (325-650 mg total) by mouth every 4 (four) hours as needed for mild  pain.   atorvastatin (LIPITOR) 80 MG tablet Take 1 tablet (80 mg total) by mouth daily. (Patient taking differently: Take 40 mg by mouth daily.)   carvedilol (COREG) 6.25 MG tablet Take 1 tablet (6.25 mg total) by mouth 2 (two) times daily with a meal.   clopidogrel (PLAVIX) 75 MG tablet TAKE 1 TABLET BY MOUTH EVERY DAY   insulin glargine (LANTUS) 100 UNIT/ML Solostar Pen Inject 15 Units into the skin daily.   lisinopril (ZESTRIL) 2.5 MG tablet Take 2.5 mg by mouth daily.   Multiple Vitamin (MULTIVITAMIN) capsule Take 1 capsule by mouth daily.   ondansetron (ZOFRAN) 4 MG tablet Take 1 tablet (4 mg total) by mouth every 6 (six) hours as needed for nausea. (Patient not taking: Reported on 09/15/2023)   oxyCODONE (OXY IR/ROXICODONE) 5 MG immediate release tablet Take 1 tablet (5 mg total) by mouth every 6 (six) hours as needed for severe pain.   Semaglutide (OZEMPIC, 0.25 OR 0.5 MG/DOSE, [place]) Inject 0.25 mg into the skin once a week.   sevelamer carbonate (RENVELA) 800 MG tablet TAKE 1 TABLET BY MOUTH 3 TIMES DAILY WITH MEALS.   sodium chloride flush 0.9 % SOLN injection Flush cholecystectomy tube as directed by general surgery   No current facility-administered medications for this visit. (Other)   REVIEW OF SYSTEMS:   ALLERGIES Allergies  Allergen Reactions   Chicken Allergy Other (See Comments)    headache   Perflutren Other (  See Comments)    Pt with back pain as soon as administered.- patient refuted this in 2024   PAST MEDICAL HISTORY Past Medical History:  Diagnosis Date   Acute pyelonephritis    Bacteremia, escherichia coli 01/12/2015   Chest pain    Critical lower limb ischemia (HCC)    Hyperlipidemia    Hypertension    Left carotid bruit    PAD (peripheral artery disease) (HCC)    Type II diabetes mellitus (HCC)    Past Surgical History:  Procedure Laterality Date   ABDOMINAL ANGIOGRAM  05/16/2015   Procedure: Abdominal Angiogram;  Surgeon: Runell Gess, MD;  Location:  [facility];  Service: Cardiovascular;;   ABDOMINAL HYSTERECTOMY  ~ 2000   CHOLECYSTECTOMY N/A 09/23/2023   Procedure: LAPAROSCOPIC CHOLECYSTECTOMY;  Surgeon: Kinsinger, De Blanch, MD;  Location: [facility] OR;  Service: General;  Laterality: N/A;   IR EXCHANGE BILIARY DRAIN  07/26/2023   IR FLUORO GUIDE CV LINE RIGHT  05/17/2023   IR PERC CHOLECYSTOSTOMY  05/23/2023   IR US GUIDE VASC ACCESS RIGHT  05/17/2023   PERIPHERAL VASCULAR CATHETERIZATION Bilateral 05/16/2015   Procedure: Lower Extremity Angiography;  Surgeon: Runell Gess, MD; renal arteries widely patent, L-SFA 75%, L-pop 95%, L-peroneal 90%; R-SFA 40%, R-pop 50%, R-prox peroneal 90%; directional atherectomy L-SFA, p-pop, drug-eluting balloon angioplasty reducing the stenoses to 0, small linear dissection was not flow limiting       PERIPHERAL VASCULAR CATHETERIZATION Left 05/16/2015   Procedure: Peripheral Vascular Atherectomy;  Surgeon: Runell Gess, MD;  Location: [facility];  Service: Cardiovascular;  Laterality: Left;  sfa   FAMILY HISTORY Family History  Problem Relation Age of Onset   Hypertension Mother    SOCIAL HISTORY Social History   Tobacco Use   Smoking status: Never   Smokeless tobacco: Never  Substance Use Topics   Alcohol use: Not Currently    Alcohol/week: 1.0 - 2.0 standard drink of alcohol    Types: 1 - 2 Cans of beer per week    Comment: occas   Drug use: No       OPHTHALMIC EXAM:  Not recorded    IMAGING AND PROCEDURES  Imaging and Procedures for 02/11/2024         ASSESSMENT/PLAN: No diagnosis found.  1-6. Proliferative diabetic retinopathy OD        Severe NPDR OS  - previously managed at [facility] -- referred here by [facility] due to insurance  - h/o IVE OS x4, last injection 12.11.24 - The incidence, risk factors for progression, natural history and treatment options for diabetic retinopathy were discussed with patient.   - The need for close monitoring of  blood glucose, blood pressure, and serum lipids, avoiding cigarette or any type of tobacco, and the need for long term follow up was also discussed with patient. - exam shows +NVI w/ closed angles OD, IOP OD 24 -- +neovascular glaucoma; OS with scattered MA/DBH, no NV - FA (02.05.25) OD: no images obtained (dense cataract); OS: no NV; scattered patches of vascular nonperfusion; scattered late leaking MA -- would likely benefit from PRP OS to areas of vascular nonperfusion - OCT OD: no images obtained; OS w/ +IRF/IRHM temporal macula, +DME - The natural history, pathology, and characteristics of diabetic macular edema discussed with patient.  A generalized discussion of the major clinical trials concerning treatment of diabetic macular edema (ETDRS, DCT, SCORE, RISE / RIDE, and ongoing DRCR net studies) was completed.  This discussion included mention of the various approaches to treating diabetic macular edema (observation, laser photocoagulation, anti-VEGF injections with lucentis / Avastin / Eylea, steroid injections with Kenalog / Ozurdex, and intraocular surgery with vitrectomy).  The goal hemoglobin A1C of 6-7 was discussed, as well as importance of smoking cessation and hypertension control.  Need for ongoing treatment and monitoring were specifically discussed with reference to chronic nature of diabetic macular edema. - BCVA OD HM, OS 20/25 - IOP OD 24, OS 14 - recommend IVA OU #1 today, 02.05.25 -- OD for NVI / neovascular glaucoma; OS for DME - pt wishes to proceed with injections - RBA of procedure discussed, questions answered - IVA informed consent obtained and signed, 02.05.25 (OU) - see procedure note - recommend starting Cosopt and Brimonidine BID OD for NVG - f/u in 4 wks (~03/01/24) -- DFE/OCT, possible injection(s) - f/u next Wednesday, February 12th -- DFE/OCT, PRP OS  7,8. Hypertensive retinopathy OU - discussed importance of tight BP control - monitor  9. Mixed Cataract OD -  The symptoms of cataract, surgical options, and treatments and risks were discussed with patient. - discussed diagnosis and progression - under the expert management of Dr. Dione Booze  10. Pseudophakia OS  - s/p CE/IOL OS (January 2025, Dr. Dione Booze)  - IOL in good position, doing well  - monitor   Ophthalmic Meds Ordered this visit:  No orders of the defined types were placed in this encounter.    No follow-ups on file.  There are no Patient Instructions on file for this visit.  This document serves as a record of services personally performed by Karie Chimera, MD, PhD. It was created on their behalf by Berlin Hun COT, an ophthalmic technician. The creation of this record is the provider's dictation and/or activities during the visit.    Electronically signed by: Berlin Hun COT 02.12.25 10:54 AM   Abbreviations: M myopia (nearsighted); A astigmatism; H hyperopia (farsighted); P presbyopia; Mrx spectacle prescription;  CTL contact lenses; OD right eye; OS left eye; OU both eyes  XT exotropia; ET esotropia; PEK punctate epithelial keratitis; PEE punctate epithelial erosions; DES dry eye syndrome; MGD meibomian gland dysfunction; ATs artificial tears; PFAT's preservative free artificial tears; NSC nuclear sclerotic cataract; PSC posterior subcapsular cataract; ERM epi-retinal membrane; PVD posterior vitreous detachment; RD retinal detachment; DM diabetes mellitus; DR diabetic retinopathy; NPDR non-proliferative diabetic retinopathy; PDR proliferative diabetic retinopathy; CSME clinically significant macular edema; DME diabetic macular edema; dbh dot blot hemorrhages; CWS cotton wool spot; POAG primary open angle glaucoma; C/D cup-to-disc ratio; HVF humphrey visual field; GVF goldmann visual field; OCT optical coherence tomography; IOP intraocular pressure; BRVO Branch retinal vein occlusion; CRVO central retinal vein occlusion; CRAO central retinal artery occlusion; BRAO branch  retinal artery occlusion; RT retinal tear; SB scleral buckle; PPV pars plana vitrectomy; VH Vitreous hemorrhage; PRP panretinal laser photocoagulation; IVK intravitreal kenalog; VMT vitreomacular traction; MH Macular hole;  NVD neovascularization of the disc; NVE neovascularization elsewhere; AREDS age related eye disease study; ARMD age related macular degeneration; POAG primary open angle glaucoma; EBMD epithelial/anterior basement membrane dystrophy; ACIOL anterior chamber intraocular lens; IOL intraocular lens; PCIOL posterior chamber intraocular lens; Phaco/IOL phacoemulsification with intraocular lens placement; PRK photorefractive keratectomy; LASIK laser assisted in situ keratomileusis; HTN hypertension; DM diabetes mellitus; COPD chronic obstructive pulmonary disease

## 2024-02-11 ENCOUNTER — Encounter (INDEPENDENT_AMBULATORY_CARE_PROVIDER_SITE_OTHER): Payer: Medicaid Other | Admitting: Ophthalmology

## 2024-02-28 NOTE — Progress Notes (Signed)
 [facility] - Clinic Note  03/06/2024   CHIEF COMPLAINT Patient presents for Retina Follow Up  HISTORY OF PRESENT ILLNESS: Samantha [name] is a 73 y.o. female who presents to the clinic today for:  HPI     Retina Follow Up   Patient presents with  Diabetic Retinopathy.  In both eyes.  This started 4 weeks ago.  Duration of 47 weeks.  Since onset it is stable.  I, the attending physician,  performed the HPI with the patient and updated documentation appropriately.        Comments   Retina follow up PDR ou and IVA OU pt is reporting vision is little blurred she denies any flashes or floaters she is using brimonidine and dorzolamide-timolol         Last edited by Rennis Chris, MD on 03/06/2024  4:41 PM.    Pt states vision is stable, she is using brimonidine and cosopt BID OD   Referring physician: Donell Beers, FNP [phone], [address] [place],  [place] [address]  HISTORICAL INFORMATION:  Selected notes from the MEDICAL RECORD NUMBER Referred by [facility]: Dec 08, 2023 at [facility] Ocular Hx- previously managed by [facility] -- h/o anti-VEGF therapy OS x4, last injection was IVE OS on 12.11.24 PMH-   CURRENT MEDICATIONS: Current Outpatient Medications (Ophthalmic Drugs)  Medication Sig   brimonidine (ALPHAGAN) 0.2 % ophthalmic solution Place 1 drop into the right eye 2 (two) times daily.   ciprofloxacin (CILOXAN) 0.3 % ophthalmic solution Administer 2 drop into the right eye four times daily for  7 days (Patient not taking: Reported on 09/15/2023)   dorzolamide-timolol (COSOPT) 2-0.5 % ophthalmic solution Place 1 drop into the right eye 2 (two) times daily.   ofloxacin (OCUFLOX) 0.3 % ophthalmic solution Place 1 drop into the left eye 4 (four) times daily.   No current facility-administered medications for this visit. (Ophthalmic Drugs)   Current Outpatient Medications (Other)  Medication Sig   acetaminophen  (TYLENOL) 325 MG tablet Take 1-2 tablets (325-650 mg total) by mouth every 4 (four) hours as needed for mild pain.   atorvastatin (LIPITOR) 80 MG tablet Take 1 tablet (80 mg total) by mouth daily. (Patient taking differently: Take 40 mg by mouth daily.)   carvedilol (COREG) 6.25 MG tablet Take 1 tablet (6.25 mg total) by mouth 2 (two) times daily with a meal.   clopidogrel (PLAVIX) 75 MG tablet TAKE 1 TABLET BY MOUTH EVERY DAY   insulin glargine (LANTUS) 100 UNIT/ML Solostar Pen Inject 15 Units into the skin daily.   lisinopril (ZESTRIL) 2.5 MG tablet Take 2.5 mg by mouth daily.   Multiple Vitamin (MULTIVITAMIN) capsule Take 1 capsule by mouth daily.   ondansetron (ZOFRAN) 4 MG tablet Take 1 tablet (4 mg total) by mouth every 6 (six) hours as needed for nausea. (Patient not taking: Reported on 09/15/2023)   oxyCODONE (OXY IR/ROXICODONE) 5 MG immediate release tablet Take 1 tablet (5 mg total) by mouth every 6 (six) hours as needed for severe pain.   Semaglutide (OZEMPIC, 0.25 OR 0.5 MG/DOSE, ) Inject 0.25 mg into the skin once a week.   sevelamer carbonate (RENVELA) 800 MG tablet TAKE 1 TABLET BY MOUTH 3 TIMES DAILY WITH MEALS.   sodium chloride flush 0.9 % SOLN injection Flush cholecystectomy tube as directed by general surgery   No current facility-administered medications for this visit. (Other)   REVIEW OF SYSTEMS: ROS  Positive for: Endocrine, Eyes Negative for: Constitutional, Gastrointestinal, Neurological, Skin, Genitourinary, Musculoskeletal, HENT, Cardiovascular, Respiratory, Psychiatric, Allergic/Imm, Heme/Lymph Last edited by Etheleen Mayhew, COT on 03/06/2024  2:40 PM.      ALLERGIES Allergies  Allergen Reactions   Chicken Allergy Other (See Comments)    headache   Perflutren Other (See Comments)    Pt with back pain as soon as administered.- patient refuted this in 2024   PAST MEDICAL HISTORY Past Medical History:  Diagnosis Date   Acute pyelonephritis     Bacteremia, escherichia coli 01/12/2015   Chest pain    Critical lower limb ischemia (HCC)    Hyperlipidemia    Hypertension    Left carotid bruit    PAD (peripheral artery disease) (HCC)    Type II diabetes mellitus (HCC)    Past Surgical History:  Procedure Laterality Date   ABDOMINAL ANGIOGRAM  05/16/2015   Procedure: Abdominal Angiogram;  Surgeon: Runell Gess, MD;  Location: [facility];  Service: Cardiovascular;;   ABDOMINAL HYSTERECTOMY  ~ 2000   CHOLECYSTECTOMY N/A 09/23/2023   Procedure: LAPAROSCOPIC CHOLECYSTECTOMY;  Surgeon: Kinsinger, De Blanch, MD;  Location: [facility] OR;  Service: General;  Laterality: N/A;   IR EXCHANGE BILIARY DRAIN  07/26/2023   IR FLUORO GUIDE CV LINE RIGHT  05/17/2023   IR PERC CHOLECYSTOSTOMY  05/23/2023   IR US GUIDE VASC ACCESS RIGHT  05/17/2023   PERIPHERAL VASCULAR CATHETERIZATION Bilateral 05/16/2015   Procedure: Lower Extremity Angiography;  Surgeon: Runell Gess, MD; renal arteries widely patent, L-SFA 75%, L-pop 95%, L-peroneal 90%; R-SFA 40%, R-pop 50%, R-prox peroneal 90%; directional atherectomy L-SFA, p-pop, drug-eluting balloon angioplasty reducing the stenoses to 0, small linear dissection was not flow limiting       PERIPHERAL VASCULAR CATHETERIZATION Left 05/16/2015   Procedure: Peripheral Vascular Atherectomy;  Surgeon: Runell Gess, MD;  Location: [facility];  Service: Cardiovascular;  Laterality: Left;  sfa   FAMILY HISTORY Family History  Problem Relation Age of Onset   Hypertension Mother    SOCIAL HISTORY Social History   Tobacco Use   Smoking status: Never   Smokeless tobacco: Never  Substance Use Topics   Alcohol use: Not Currently    Alcohol/week: 1.0 - 2.0 standard drink of alcohol    Types: 1 - 2 Cans of beer per week    Comment: occas   Drug use: No       OPHTHALMIC EXAM:  Base Eye Exam     Visual Acuity (Snellen - Linear)       Right Left   Dist [place] CF 1' 20/30   Dist ph [place] NI NI          Tonometry (Tonopen, 2:47 PM)       Right Left   Pressure 22 15         Pupils       Pupils Dark Light Shape React APD   Right PERRL 5 5 Round NR    Left PERRL 3 2 Round Brisk None         Extraocular Movement       Right Left    Full, Ortho Full, Ortho         Neuro/Psych     Oriented x3: Yes   Mood/Affect: Normal         Dilation     Both eyes: 2.5% Phenylephrine @ 2:47 PM           Slit Lamp and  Fundus Exam     Slit Lamp Exam       Right Left   Lids/Lashes Dermatochalasis - upper lid Dermatochalasis - upper lid   Conjunctiva/Sclera nasal and temporal pinguecula nasal and temporal pinguecula, mild melanosis   Cornea trace PEE, mild tear film debris well healed cataract wound   Anterior Chamber deep, narrow angles deep and clear   Iris Round and dilated, 360 NVI Round and dilated, No NVI   Lens 3-4+ Nuclear sclerosis, 3-4+ Cortical cataract, 1+ Posterior subcapsular cataract PC IOL in good position, trace posterior capsular opacification   Anterior Vitreous Vitreous syneresis Vitreous syneresis         Fundus Exam       Right Left   Disc hazy view, 3-4+Pallor, severely attenuated vessels, +cupping 2+ Pallor, Sharp rim, mild PPA   C/D Ratio 0.9 0.3   Macula hazy view, grossly flat, diffuse atrophy, no heme Flat, Good foveal reflex, scattered MA greatest temporal, +cystic changes   Vessels Severe attenuation attenuated, Tortuous, focal sclerosis temporal macula   Periphery very hazy view, attached, Attached, rare MA           IMAGING AND PROCEDURES  Imaging and Procedures for 03/06/2024  OCT, Retina - OU - Both Eyes       Right Eye Quality was poor. Central Foveal Thickness: 217. Progression has been stable. Findings include normal foveal contour, no IRF, no SRF, epiretinal membrane, outer retinal atrophy (Grainy images, retina attached, diffuse IRA / ischemia, no obvious IRF/SRF).   Left Eye Quality was good. Central Foveal  Thickness: 277. Progression has been stable. Findings include normal foveal contour, no SRF, intraretinal hyper-reflective material, intraretinal fluid (Mild IRF/edema temporal macula -- persistent).   Notes *Images captured and stored on drive  Diagnosis / Impression:  OD: Grainy images, retina attached, diffuse IRA / ischemia, no obvious IRF/SRF - no DME OS: Mild IRF/edema temporal macula -- persistent  Clinical management:  See below  Abbreviations: NFP - Normal foveal profile. CME - cystoid macular edema. PED - pigment epithelial detachment. IRF - intraretinal fluid. SRF - subretinal fluid. EZ - ellipsoid zone. ERM - epiretinal membrane. ORA - outer retinal atrophy. ORT - outer retinal tubulation. SRHM - subretinal hyper-reflective material. IRHM - intraretinal hyper-reflective material      Intravitreal Injection, Pharmacologic Agent - OD - Right Eye       Time Out 03/06/2024. 3:35 PM. Confirmed correct patient, procedure, site, and patient consented.   Anesthesia Topical anesthesia was used. Anesthetic medications included Lidocaine 2%, Proparacaine 0.5%.   Procedure Preparation included 5% betadine to ocular surface, eyelid speculum. A supplied needle was used.   Injection: 1.25 mg Bevacizumab 1.25mg /0.56ml   Route: Intravitreal, Site: Right Eye   NDC: P3213405, Lot: [id number] , Expiration date: 04/03/2024   Post-op Post injection exam found visual acuity of at least counting fingers. The patient tolerated the procedure well. There were no complications. The patient received written and verbal post procedure care education.      Intravitreal Injection, Pharmacologic Agent - OS - Left Eye       Time Out 03/06/2024. 3:35 PM. Confirmed correct patient, procedure, site, and patient consented.   Anesthesia Topical anesthesia was used. Anesthetic medications included Lidocaine 2%, Proparacaine 0.5%.   Procedure Preparation included 5% betadine to ocular surface,  eyelid speculum. A supplied needle was used.   Injection: 1.25 mg Bevacizumab 1.25mg /0.61ml   Route: Intravitreal, Site: Left Eye   NDC: P3213405, Lot: [id number], Expiration date: 04/30/2024  Post-op Post injection exam found visual acuity of at least counting fingers. The patient tolerated the procedure well. There were no complications. The patient received written and verbal post procedure care education.           ASSESSMENT/PLAN:   ICD-10-CM   1. 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 Neovascular glaucoma of right eye, severe stage  H40.51X3     4. 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 Current use of insulin (HCC)  Z79.4     6. Long-term (current) use of injectable non-insulin antidiabetic drugs  Z79.85     7. Hypertensive retinopathy of both eyes  H35.033     8. Essential hypertension  I10     9. Combined forms of age-related cataract of right eye  H25.811     10. Pseudophakia  Z96.1      1-6. Proliferative diabetic retinopathy OD        Severe NPDR OS  - previously managed at [facility] -- referred here by [facility] due to insurance  - h/o IVE OS x4, last injection 12.11.24 w/ Dr. Allena Katz  - here s/p IVA OU #1 (02.05.25) - exam shows +NVI w/ closed angles OD, IOP OD 22 from 24 -- +neovascular glaucoma; OS with scattered MA/DBH, no NV - FA (02.05.25) OD: no images obtained (dense cataract); OS: no NV; scattered patches of vascular nonperfusion; scattered late leaking MA -- would likely benefit  from PRP OS to areas of vascular nonperfusion - OCT shows OD: Grainy images, retina attached, diffuse IRA / ischemia, no obvious IRF/SRF - no DME; OS: Mild IRF/edema temporal macula -- persistent - BCVA OD CF, OS 20/25 - IOP OD 22, OS 15 - recommend IVA OU #2 today, 03.10.25 -- OD for NVI / neovascular glaucoma; OS for DME - pt wishes to proceed with injections - RBA of procedure discussed, questions answered - IVA informed consent obtained and signed, 02.05.25 (OU) - see procedure note - continue Cosopt and Brimonidine BID OD for NVG - f/u in 4 wks (~04.07.25) -- DFE/OCT, possible injection(s) - f/u Monday, March 31 -- DFE/OCT, PRP OD?  7,8. Hypertensive retinopathy OU - discussed importance of tight BP control - monitor  9. Mixed Cataract OD - The symptoms of cataract, surgical options, and treatments and risks were discussed with patient. - discussed diagnosis and progression - under the expert management of Dr. Dione Booze  10. Pseudophakia OS  - s/p CE/IOL OS (January 2025, Dr. Dione Booze)  - IOL in good position, doing well  - monitor   Ophthalmic Meds Ordered this visit:  Meds ordered this encounter  Medications   Bevacizumab (AVASTIN) SOLN 1.25 mg   Bevacizumab (AVASTIN) SOLN 1.25 mg     Return in about 3 weeks (around 03/27/2024) for f/u PDR OU, DFE, OCT, PRP OD.  There are no Patient Instructions on file for this visit.  This document serves as a record of services personally performed by Karie Chimera, MD, PhD. It  was created on their behalf by Glee Arvin. Manson Passey, OA an ophthalmic technician. The creation of this record is the provider's dictation and/or activities during the visit.    Electronically signed by: Glee Arvin. Manson Passey, OA 03/06/24 4:43 PM  Karie Chimera, M.D., Ph.D. Diseases & Surgery of the Retina and Vitreous [facility] 03/06/2024   I have reviewed the above documentation for accuracy and completeness, and I agree with the above.  Karie Chimera, M.D., Ph.D. 03/06/24 4:45 PM   Abbreviations: M myopia (nearsighted); A astigmatism; H hyperopia (farsighted); P presbyopia; Mrx spectacle prescription;  CTL contact lenses; OD right eye; OS left eye; OU both eyes  XT exotropia; ET esotropia; PEK punctate epithelial keratitis; PEE punctate epithelial erosions; DES dry eye syndrome; MGD meibomian gland dysfunction; ATs artificial tears; PFAT's preservative free artificial tears; NSC nuclear sclerotic cataract; PSC posterior subcapsular cataract; ERM epi-retinal membrane; PVD posterior vitreous detachment; RD retinal detachment; DM diabetes mellitus; DR diabetic retinopathy; NPDR non-proliferative diabetic retinopathy; PDR proliferative diabetic retinopathy; CSME clinically significant macular edema; DME diabetic macular edema; dbh dot blot hemorrhages; CWS cotton wool spot; POAG primary open angle glaucoma; C/D cup-to-disc ratio; HVF humphrey visual field; GVF goldmann visual field; OCT optical coherence tomography; IOP intraocular pressure; BRVO Branch retinal vein occlusion; CRVO central retinal vein occlusion; CRAO central retinal artery occlusion; BRAO branch retinal artery occlusion; RT retinal tear; SB scleral buckle; PPV pars plana vitrectomy; VH Vitreous hemorrhage; PRP panretinal laser photocoagulation; IVK intravitreal kenalog; VMT vitreomacular traction; MH Macular hole;  NVD neovascularization of the disc; NVE neovascularization elsewhere; AREDS age related eye disease study; ARMD age related macular degeneration; POAG primary open angle glaucoma; EBMD epithelial/anterior basement membrane dystrophy; ACIOL anterior chamber intraocular lens; IOL intraocular lens; PCIOL posterior chamber intraocular lens; Phaco/IOL phacoemulsification with intraocular lens placement; PRK photorefractive keratectomy; LASIK laser assisted in situ keratomileusis; HTN hypertension; DM diabetes mellitus; COPD chronic obstructive pulmonary disease

## 2024-03-06 ENCOUNTER — Ambulatory Visit (INDEPENDENT_AMBULATORY_CARE_PROVIDER_SITE_OTHER): Payer: Medicaid Other | Admitting: Ophthalmology

## 2024-03-06 ENCOUNTER — Encounter (INDEPENDENT_AMBULATORY_CARE_PROVIDER_SITE_OTHER): Payer: Self-pay | Admitting: Ophthalmology

## 2024-03-06 DIAGNOSIS — H211X1 Other vascular disorders of iris and ciliary body, right eye: Secondary | ICD-10-CM

## 2024-03-06 DIAGNOSIS — H4051X3 Glaucoma secondary to other eye disorders, right eye, severe stage: Secondary | ICD-10-CM

## 2024-03-06 DIAGNOSIS — Z7985 Long-term (current) use of injectable non-insulin antidiabetic drugs: Secondary | ICD-10-CM

## 2024-03-06 DIAGNOSIS — E113591 Type 2 diabetes mellitus with proliferative diabetic retinopathy without macular edema, right eye: Secondary | ICD-10-CM | POA: Diagnosis not present

## 2024-03-06 DIAGNOSIS — I10 Essential (primary) hypertension: Secondary | ICD-10-CM

## 2024-03-06 DIAGNOSIS — E113412 Type 2 diabetes mellitus with severe nonproliferative diabetic retinopathy with macular edema, left eye: Secondary | ICD-10-CM | POA: Diagnosis not present

## 2024-03-06 DIAGNOSIS — Z794 Long term (current) use of insulin: Secondary | ICD-10-CM

## 2024-03-06 DIAGNOSIS — H35033 Hypertensive retinopathy, bilateral: Secondary | ICD-10-CM

## 2024-03-06 DIAGNOSIS — Z961 Presence of intraocular lens: Secondary | ICD-10-CM

## 2024-03-06 DIAGNOSIS — H25811 Combined forms of age-related cataract, right eye: Secondary | ICD-10-CM

## 2024-03-06 MED ORDER — BEVACIZUMAB CHEMO INJECTION 1.25MG/0.05ML SYRINGE FOR KALEIDOSCOPE
1.2500 mg | INTRAVITREAL | Status: AC | PRN
  Administered 2024-03-06: 1.25 mg via INTRAVITREAL

## 2024-03-27 ENCOUNTER — Encounter (INDEPENDENT_AMBULATORY_CARE_PROVIDER_SITE_OTHER): Admitting: Ophthalmology

## 2024-03-27 DIAGNOSIS — H4051X3 Glaucoma secondary to other eye disorders, right eye, severe stage: Secondary | ICD-10-CM

## 2024-03-27 DIAGNOSIS — H211X1 Other vascular disorders of iris and ciliary body, right eye: Secondary | ICD-10-CM

## 2024-03-27 DIAGNOSIS — Z961 Presence of intraocular lens: Secondary | ICD-10-CM

## 2024-03-27 DIAGNOSIS — Z794 Long term (current) use of insulin: Secondary | ICD-10-CM

## 2024-03-27 DIAGNOSIS — H25811 Combined forms of age-related cataract, right eye: Secondary | ICD-10-CM

## 2024-03-27 DIAGNOSIS — Z7985 Long-term (current) use of injectable non-insulin antidiabetic drugs: Secondary | ICD-10-CM

## 2024-03-27 DIAGNOSIS — E113412 Type 2 diabetes mellitus with severe nonproliferative diabetic retinopathy with macular edema, left eye: Secondary | ICD-10-CM

## 2024-03-27 DIAGNOSIS — I10 Essential (primary) hypertension: Secondary | ICD-10-CM

## 2024-03-27 DIAGNOSIS — E113591 Type 2 diabetes mellitus with proliferative diabetic retinopathy without macular edema, right eye: Secondary | ICD-10-CM

## 2024-03-27 DIAGNOSIS — H35033 Hypertensive retinopathy, bilateral: Secondary | ICD-10-CM

## 2024-03-31 NOTE — Progress Notes (Signed)
 [facility] - Clinic Note  04/03/2024   CHIEF COMPLAINT Patient presents for Retina Follow Up  HISTORY OF PRESENT ILLNESS: Samantha [name] is a 73 y.o. female who presents to the clinic today for:  HPI     Retina Follow Up   Patient presents with  Diabetic Retinopathy.  In both eyes.  This started 4 weeks ago.  I, the attending physician,  performed the HPI with the patient and updated documentation appropriately.        Comments   Patient here for 4 weeks retina follow up for PDR OD. Sever NPDR OS. Patient states vision doing bad. Gets better when eats. not sure if vitamin deficiency. No eye pain.      Last edited by Rennis Chris, MD on 04/03/2024  5:36 PM.    Pt states she had no problems with the injections at last visit   Referring physician: Donell Beers, FNP [id number] [address], [address] [place],  [place] [address]  HISTORICAL INFORMATION:  Selected notes from the MEDICAL RECORD NUMBER Referred by [facility]: Dec 08, 2023 at [facility] Ocular Hx- previously managed by [facility] -- h/o anti-VEGF therapy OS x4, last injection was IVE OS on 12.11.24 PMH-   CURRENT MEDICATIONS: Current Outpatient Medications (Ophthalmic Drugs)  Medication Sig   brimonidine (ALPHAGAN) 0.2 % ophthalmic solution Place 1 drop into the right eye 2 (two) times daily.   dorzolamide-timolol (COSOPT) 2-0.5 % ophthalmic solution Place 1 drop into the right eye 2 (two) times daily.   ofloxacin (OCUFLOX) 0.3 % ophthalmic solution Place 1 drop into the left eye 4 (four) times daily.   ciprofloxacin (CILOXAN) 0.3 % ophthalmic solution Administer 2 drop into the right eye four times daily for  7 days (Patient not taking: Reported on 09/15/2023)   No current facility-administered medications for this visit. (Ophthalmic Drugs)   Current Outpatient Medications (Other)  Medication Sig   acetaminophen (TYLENOL) 325 MG tablet Take 1-2 tablets (325-650 mg  total) by mouth every 4 (four) hours as needed for mild pain.   atorvastatin (LIPITOR) 80 MG tablet Take 1 tablet (80 mg total) by mouth daily. (Patient taking differently: Take 40 mg by mouth daily.)   carvedilol (COREG) 6.25 MG tablet Take 1 tablet (6.25 mg total) by mouth 2 (two) times daily with a meal.   clopidogrel (PLAVIX) 75 MG tablet TAKE 1 TABLET BY MOUTH EVERY DAY   insulin glargine (LANTUS) 100 UNIT/ML Solostar Pen Inject 15 Units into the skin daily.   lisinopril (ZESTRIL) 2.5 MG tablet Take 2.5 mg by mouth daily.   Multiple Vitamin (MULTIVITAMIN) capsule Take 1 capsule by mouth daily.   oxyCODONE (OXY IR/ROXICODONE) 5 MG immediate release tablet Take 1 tablet (5 mg total) by mouth every 6 (six) hours as needed for severe pain.   Semaglutide (OZEMPIC, 0.25 OR 0.5 MG/DOSE, [place]) Inject 0.25 mg into the skin once a week.   sevelamer carbonate (RENVELA) 800 MG tablet TAKE 1 TABLET BY MOUTH 3 TIMES DAILY WITH MEALS.   sodium chloride flush 0.9 % SOLN injection Flush cholecystectomy tube as directed by general surgery   ondansetron (ZOFRAN) 4 MG tablet Take 1 tablet (4 mg total) by mouth every 6 (six) hours as needed for nausea. (Patient not taking: Reported on 09/15/2023)   No current facility-administered medications for this visit. (Other)   REVIEW OF SYSTEMS: ROS   Positive for: Endocrine, Eyes Negative for: Constitutional, Gastrointestinal, Neurological, Skin, Genitourinary,  Musculoskeletal, HENT, Cardiovascular, Respiratory, Psychiatric, Allergic/Imm, Heme/Lymph Last edited by Laddie Aquas, COA on 04/03/2024  3:02 PM.     ALLERGIES Allergies  Allergen Reactions   Chicken Allergy Other (See Comments)    headache   Perflutren Other (See Comments)    Pt with back pain as soon as administered.- patient refuted this in 2024   PAST MEDICAL HISTORY Past Medical History:  Diagnosis Date   Acute pyelonephritis    Bacteremia, escherichia coli 01/12/2015   Chest pain    Critical  lower limb ischemia (HCC)    Hyperlipidemia    Hypertension    Left carotid bruit    PAD (peripheral artery disease) (HCC)    Type II diabetes mellitus (HCC)    Past Surgical History:  Procedure Laterality Date   ABDOMINAL ANGIOGRAM  05/16/2015   Procedure: Abdominal Angiogram;  Surgeon: Runell Gess, MD;  Location: [facility];  Service: Cardiovascular;;   ABDOMINAL HYSTERECTOMY  ~ 2000   CHOLECYSTECTOMY N/A 09/23/2023   Procedure: LAPAROSCOPIC CHOLECYSTECTOMY;  Surgeon: Kinsinger, De Blanch, MD;  Location: [facility] OR;  Service: General;  Laterality: N/A;   IR EXCHANGE BILIARY DRAIN  07/26/2023   IR FLUORO GUIDE CV LINE RIGHT  05/17/2023   IR PERC CHOLECYSTOSTOMY  05/23/2023   IR US GUIDE VASC ACCESS RIGHT  05/17/2023   PERIPHERAL VASCULAR CATHETERIZATION Bilateral 05/16/2015   Procedure: Lower Extremity Angiography;  Surgeon: Runell Gess, MD; renal arteries widely patent, L-SFA 75%, L-pop 95%, L-peroneal 90%; R-SFA 40%, R-pop 50%, R-prox peroneal 90%; directional atherectomy L-SFA, p-pop, drug-eluting balloon angioplasty reducing the stenoses to 0, small linear dissection was not flow limiting       PERIPHERAL VASCULAR CATHETERIZATION Left 05/16/2015   Procedure: Peripheral Vascular Atherectomy;  Surgeon: Runell Gess, MD;  Location: [facility];  Service: Cardiovascular;  Laterality: Left;  sfa   FAMILY HISTORY Family History  Problem Relation Age of Onset   Hypertension Mother    SOCIAL HISTORY Social History   Tobacco Use   Smoking status: Never   Smokeless tobacco: Never  Vaping Use   Vaping status: Never Used  Substance Use Topics   Alcohol use: Not Currently    Alcohol/week: 1.0 - 2.0 standard drink of alcohol    Types: 1 - 2 Cans of beer per week    Comment: occas   Drug use: No       OPHTHALMIC EXAM:  Base Eye Exam     Visual Acuity (Snellen - Linear)       Right Left   Dist [place] [facility] 20/30 -2   Dist ph [place]  NI         Tonometry (Tonopen,  2:45 PM)       Right Left   Pressure OR-unable 17  Took another reading with 2 different tono pens. One said OR the other got 48, and 44. Gave Brimonidine and cosopt OD at 2:54 PM.        Pupils       Dark Light Shape React APD   Right 5 5 Round NR    Left 3 2 Round Brisk None         Visual Fields (Counting fingers)       Left Right    Full    Restrictions  Partial inner superior temporal, inferior temporal, superior nasal, inferior nasal deficiencies         Extraocular Movement       Right Left  Full, Ortho Full, Ortho         Neuro/Psych     Oriented x3: Yes   Mood/Affect: Normal         Dilation     Both eyes: 1.0% Mydriacyl, 2.5% Phenylephrine @ 2:45 PM           Slit Lamp and Fundus Exam     Slit Lamp Exam       Right Left   Lids/Lashes Dermatochalasis - upper lid Dermatochalasis - upper lid   Conjunctiva/Sclera nasal and temporal pinguecula, prominent vessel ST quad nasal and temporal pinguecula, mild melanosis, Trace Injection   Cornea trace PEE, mild tear film debris well healed cataract wound   Anterior Chamber deep, closed angle temporally deep and clear   Iris Round and dilated, 360 NVI Round and dilated, No NVI   Lens 3-4+ Nuclear sclerosis with brunescence, 3-4+ Cortical cataract, 2+ Posterior subcapsular cataract PC IOL in good position, trace posterior capsular opacification   Anterior Vitreous Vitreous syneresis Vitreous syneresis         Fundus Exam       Right Left   Disc hazy view, 3-4+Pallor, severely attenuated vessels, +cupping 2+ Pallor, Sharp rim, mild PPA   C/D Ratio 0.9 0.3   Macula hazy view, grossly flat, diffuse atrophy, no heme Flat, Good foveal reflex, scattered MA greatest temporal, +cystic changes   Vessels Severe attenuation attenuated, Tortuous, focal sclerosis temporal macula   Periphery very hazy view, attached, Attached, rare MA           IMAGING AND PROCEDURES  Imaging and Procedures for  04/03/2024  OCT, Retina - OU - Both Eyes       Right Eye Quality was poor. Central Foveal Thickness: 294. Progression has been stable. Findings include normal foveal contour, no IRF, no SRF, epiretinal membrane, outer retinal atrophy (Grainy images, retina attached, diffuse IRA / ischemia, no obvious IRF/SRF).   Left Eye Quality was good. Central Foveal Thickness: 272. Progression has improved. Findings include normal foveal contour, no SRF, intraretinal hyper-reflective material, intraretinal fluid (Mild IRF/edema temporal macula -- improved).   Notes *Images captured and stored on drive  Diagnosis / Impression:  OD: Grainy images, retina attached, diffuse IRA / ischemia, no obvious IRF/SRF - no DME OS: Mild IRF/edema temporal macula -- improved  Clinical management:  See below  Abbreviations: NFP - Normal foveal profile. CME - cystoid macular edema. PED - pigment epithelial detachment. IRF - intraretinal fluid. SRF - subretinal fluid. EZ - ellipsoid zone. ERM - epiretinal membrane. ORA - outer retinal atrophy. ORT - outer retinal tubulation. SRHM - subretinal hyper-reflective material. IRHM - intraretinal hyper-reflective material      Intravitreal Injection, Pharmacologic Agent - OD - Right Eye       Time Out 04/03/2024. 3:43 PM. Confirmed correct patient, procedure, site, and patient consented.   Anesthesia Topical anesthesia was used. Anesthetic medications included Lidocaine 2%, Proparacaine 0.5%.   Procedure Preparation included 5% betadine to ocular surface, eyelid speculum. A supplied needle was used.   Injection: 1.25 mg Bevacizumab 1.25mg /0.67ml   Route: Intravitreal, Site: Right Eye   NDC: P3213405, Lot: [id number] , Expiration date: 05/06/2024   Post-op Post injection exam found visual acuity of at least counting fingers. The patient tolerated the procedure well. There were no complications. The patient received written and verbal post procedure care education.    Notes An AC tap was performed following injection due to elevated IOP using a 30 gauge needle on a syringe  with the plunger removed. The needle was placed at the limbus at 7 oclock and approximately 0.15cc of aqueous was removed from the anterior chamber. Betadine was applied to the tap area before and after the paracentesis was performed. There were no complications. The patient tolerated the procedure well. The IOP was rechecked and was found to be ~14 mmHg by digital palpation.      Intravitreal Injection, Pharmacologic Agent - OS - Left Eye       Time Out 04/03/2024. 3:44 PM. Confirmed correct patient, procedure, site, and patient consented.   Anesthesia Topical anesthesia was used. Anesthetic medications included Lidocaine 2%, Proparacaine 0.5%.   Procedure Preparation included 5% betadine to ocular surface, eyelid speculum. A (32g) needle was used.   Injection: 1.25 mg Bevacizumab 1.25mg /0.52ml   Route: Intravitreal, Site: Left Eye   NDC: P3213405, Lot: [id number], Expiration date: 07/06/2024   Post-op Post injection exam found visual acuity of at least counting fingers. The patient tolerated the procedure well. There were no complications. The patient received written and verbal post procedure care education.           ASSESSMENT/PLAN:   ICD-10-CM   1. 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 Proliferative diabetic retinopathy of right eye without macular edema associated with type 2 diabetes mellitus (HCC)  A54.0981 Intravitreal Injection, Pharmacologic Agent - OD - Right Eye    Bevacizumab (AVASTIN) SOLN 1.25 mg    3. Neovascular glaucoma of right eye, severe stage  H40.51X3 Intravitreal Injection, Pharmacologic Agent - OD - Right Eye     Bevacizumab (AVASTIN) SOLN 1.25 mg    4. Rubeosis iridis of right eye  H21.1X1 Intravitreal Injection, Pharmacologic Agent - OD - Right Eye    Bevacizumab (AVASTIN) SOLN 1.25 mg    5. Current use of insulin (HCC)  Z79.4     6. Long-term (current) use of injectable non-insulin antidiabetic drugs  Z79.85     7. Hypertensive retinopathy of both eyes  H35.033     8. Essential hypertension  I10     9. Combined forms of age-related cataract of right eye  H25.811     10. Pseudophakia  Z96.1      1-6. Proliferative diabetic retinopathy OD        Severe NPDR OS  - previously managed at [facility] -- referred here by [facility] due to insurance  - h/o IVE OS x4, last injection 12.11.24 w/ Dr. Allena Katz  - here s/p IVA OU #1 (02.05.25) #2(03.10.25) - exam shows +NVI w/ closed angles OD, IOP OD 40+ -- +neovascular glaucoma; OS with scattered MA/DBH, no NV - FA (02.05.25) OD: no images obtained (dense cataract); OS: no NV; scattered patches of vascular nonperfusion; scattered late leaking MA -- would likely benefit from PRP OS to areas of vascular nonperfusion - OCT shows OD: Grainy images, retina attached, diffuse IRA / ischemia, no obvious IRF/SRF - no DME; OS: Mild IRF/edema temporal macula -- improved - BCVA OD LP, OS 20/25 - IOP OD 44,48, OS 17 - recommend IVA OU #3 today, 04.07.25 -- OD for NVI / neovascular glaucoma; OS for DME - pt wishes to proceed with injections - RBA of procedure discussed, questions answered - IVA informed consent obtained  and signed, 02.05.25 (OU) - see procedure note - increase Cosopt and Brimonidine TID OD for NVG - will refer to [facility] service for urgent consult - f/u here in 4 wks, DFE, OCT, possible injection(s)  7,8. Hypertensive retinopathy OU - discussed importance of tight BP control - monitor  9. Mixed Cataract OD - The symptoms of cataract, surgical options, and treatments and risks were discussed with patient. - discussed diagnosis  and progression - under the expert management of Dr. Dione Booze  10. Pseudophakia OS  - s/p CE/IOL OS (January 2025, Dr. Dione Booze)  - IOL in good position, doing well  - monitor  Ophthalmic Meds Ordered this visit:  Meds ordered this encounter  Medications   Bevacizumab (AVASTIN) SOLN 1.25 mg   Bevacizumab (AVASTIN) SOLN 1.25 mg    Return in about 4 weeks (around 05/01/2024) for f/u PDR OU, DFE, OCT, Possible Injxn.  There are no Patient Instructions on file for this visit.  This document serves as a record of services personally performed by Karie Chimera, MD, PhD. It was created on their behalf by Berlin Hun COT, an ophthalmic technician. The creation of this record is the provider's dictation and/or activities during the visit.    Electronically signed by: Berlin Hun COT 04.04.25 5:44 PM  This document serves as a record of services personally performed by Karie Chimera, MD, PhD. It was created on their behalf by Glee Arvin. Manson Passey, OA an ophthalmic technician. The creation of this record is the provider's dictation and/or activities during the visit.    Electronically signed by: Glee Arvin. Manson Passey, OA 04/03/24 5:44 PM  Karie Chimera, M.D., Ph.D. Diseases & Surgery of the Retina and Vitreous [facility] 04/03/2024   I have reviewed the above documentation for accuracy and completeness, and I agree with the above. Karie Chimera, M.D., Ph.D. 04/03/24 5:50 PM   Abbreviations: M myopia (nearsighted); A astigmatism; H hyperopia (farsighted); P presbyopia; Mrx spectacle prescription;  CTL contact lenses; OD right eye; OS left eye; OU both eyes  XT exotropia; ET esotropia; PEK punctate epithelial keratitis; PEE punctate epithelial erosions; DES dry eye syndrome; MGD meibomian gland dysfunction; ATs artificial tears; PFAT's preservative free artificial tears; NSC nuclear sclerotic cataract; PSC posterior subcapsular cataract; ERM epi-retinal membrane; PVD  posterior vitreous detachment; RD retinal detachment; DM diabetes mellitus; DR diabetic retinopathy; NPDR non-proliferative diabetic retinopathy; PDR proliferative diabetic retinopathy; CSME clinically significant macular edema; DME diabetic macular edema; dbh dot blot hemorrhages; CWS cotton wool spot; POAG primary open angle glaucoma; C/D cup-to-disc ratio; HVF humphrey visual field; GVF goldmann visual field; OCT optical coherence tomography; IOP intraocular pressure; BRVO Branch retinal vein occlusion; CRVO central retinal vein occlusion; CRAO central retinal artery occlusion; BRAO branch retinal artery occlusion; RT retinal tear; SB scleral buckle; PPV pars plana vitrectomy; VH Vitreous hemorrhage; PRP panretinal laser photocoagulation; IVK intravitreal kenalog; VMT vitreomacular traction; MH Macular hole;  NVD neovascularization of the disc; NVE neovascularization elsewhere; AREDS age related eye disease study; ARMD age related macular degeneration; POAG primary open angle glaucoma; EBMD epithelial/anterior basement membrane dystrophy; ACIOL anterior chamber intraocular lens; IOL intraocular lens; PCIOL posterior chamber intraocular lens; Phaco/IOL phacoemulsification with intraocular lens placement; PRK photorefractive keratectomy; LASIK laser assisted in situ keratomileusis; HTN hypertension; DM diabetes mellitus; COPD chronic obstructive pulmonary disease

## 2024-04-03 ENCOUNTER — Ambulatory Visit (INDEPENDENT_AMBULATORY_CARE_PROVIDER_SITE_OTHER): Admitting: Ophthalmology

## 2024-04-03 ENCOUNTER — Encounter (INDEPENDENT_AMBULATORY_CARE_PROVIDER_SITE_OTHER): Payer: Self-pay | Admitting: Ophthalmology

## 2024-04-03 DIAGNOSIS — Z961 Presence of intraocular lens: Secondary | ICD-10-CM

## 2024-04-03 DIAGNOSIS — H25811 Combined forms of age-related cataract, right eye: Secondary | ICD-10-CM

## 2024-04-03 DIAGNOSIS — I10 Essential (primary) hypertension: Secondary | ICD-10-CM

## 2024-04-03 DIAGNOSIS — H211X1 Other vascular disorders of iris and ciliary body, right eye: Secondary | ICD-10-CM

## 2024-04-03 DIAGNOSIS — Z7985 Long-term (current) use of injectable non-insulin antidiabetic drugs: Secondary | ICD-10-CM

## 2024-04-03 DIAGNOSIS — H4051X3 Glaucoma secondary to other eye disorders, right eye, severe stage: Secondary | ICD-10-CM | POA: Diagnosis not present

## 2024-04-03 DIAGNOSIS — Z794 Long term (current) use of insulin: Secondary | ICD-10-CM

## 2024-04-03 DIAGNOSIS — E113591 Type 2 diabetes mellitus with proliferative diabetic retinopathy without macular edema, right eye: Secondary | ICD-10-CM | POA: Diagnosis not present

## 2024-04-03 DIAGNOSIS — H35033 Hypertensive retinopathy, bilateral: Secondary | ICD-10-CM

## 2024-04-03 DIAGNOSIS — E113412 Type 2 diabetes mellitus with severe nonproliferative diabetic retinopathy with macular edema, left eye: Secondary | ICD-10-CM | POA: Diagnosis not present

## 2024-04-03 MED ORDER — BEVACIZUMAB CHEMO INJECTION 1.25MG/0.05ML SYRINGE FOR KALEIDOSCOPE
1.2500 mg | INTRAVITREAL | Status: AC | PRN
  Administered 2024-04-03: 1.25 mg via INTRAVITREAL

## 2024-04-24 NOTE — Progress Notes (Addendum)
 [facility] - Clinic Note  05/01/2024   CHIEF COMPLAINT Patient presents for Retina Follow Up  HISTORY OF PRESENT ILLNESS: Samantha [name] is a 73 y.o. female who presents to the clinic today for:  HPI     Retina Follow Up   Patient presents with  Diabetic Retinopathy.  In both eyes.  This started 4 weeks ago.  I, the attending physician,  performed the HPI with the patient and updated documentation appropriately.        Comments   Patient here for 4 weeks retina follow up for PDR OU. Patient states vision a little bit blurry. No eye pain.       Last edited by Ronelle Coffee, MD on 05/01/2024  4:44 PM.     Pt states no notice in change in [place]. Pt states no improvement after injections last visit.   Referring physician: Paseda, Folashade R, FNP [phone] [address]. [address] [place],  [place] [address]  HISTORICAL INFORMATION:  Selected notes from the MEDICAL RECORD NUMBER Referred by [facility]: Dec 08, 2023 at [facility] Ocular Hx- previously managed by [facility] -- h/o anti-VEGF therapy OS x4, last injection was IVE OS on 12.11.24 PMH-   CURRENT MEDICATIONS: Current Outpatient Medications (Ophthalmic Drugs)  Medication Sig   brimonidine  (ALPHAGAN ) 0.2 % ophthalmic solution Place 1 drop into the right eye 2 (two) times daily.   ciprofloxacin  (CILOXAN ) 0.3 % ophthalmic solution Administer 2 drop into the right eye four times daily for  7 days   dorzolamide -timolol  (COSOPT ) 2-0.5 % ophthalmic solution Place 1 drop into the right eye 2 (two) times daily.   ofloxacin (OCUFLOX) 0.3 % ophthalmic solution Place 1 drop into the left eye 4 (four) times daily.   No current facility-administered medications for this visit. (Ophthalmic Drugs)   Current Outpatient Medications (Other)  Medication Sig   acetaminophen  (TYLENOL ) 325 MG tablet Take 1-2 tablets (325-650 mg total) by mouth every 4 (four) hours as needed for mild pain.   atorvastatin   (LIPITOR ) 80 MG tablet Take 1 tablet (80 mg total) by mouth daily. (Patient taking differently: Take 40 mg by mouth daily.)   carvedilol  (COREG ) 6.25 MG tablet Take 1 tablet (6.25 mg total) by mouth 2 (two) times daily with a meal.   clopidogrel  (PLAVIX ) 75 MG tablet TAKE 1 TABLET BY MOUTH EVERY DAY   insulin  glargine (LANTUS ) 100 UNIT/ML Solostar Pen Inject 15 Units into the skin daily.   lisinopril  (ZESTRIL ) 2.5 MG tablet Take 2.5 mg by mouth daily.   Multiple Vitamin (MULTIVITAMIN) capsule Take 1 capsule by mouth daily.   oxyCODONE  (OXY IR/ROXICODONE ) 5 MG immediate release tablet Take 1 tablet (5 mg total) by mouth every 6 (six) hours as needed for severe pain.   Semaglutide (OZEMPIC, 0.25 OR 0.5 MG/DOSE, [place]) Inject 0.25 mg into the skin once a week.   sevelamer  carbonate (RENVELA ) 800 MG tablet TAKE 1 TABLET BY MOUTH 3 TIMES DAILY WITH MEALS.   sodium chloride  flush 0.9 % SOLN injection Flush cholecystectomy tube as directed by general surgery   ondansetron  (ZOFRAN ) 4 MG tablet Take 1 tablet (4 mg total) by mouth every 6 (six) hours as needed for nausea. (Patient not taking: Reported on 09/15/2023)   No current facility-administered medications for this visit. (Other)   REVIEW OF SYSTEMS: ROS   Positive for: Endocrine, Eyes Negative for: Constitutional, Gastrointestinal, Neurological, Skin, Genitourinary, Musculoskeletal, HENT, Cardiovascular, Respiratory, Psychiatric, Allergic/Imm, Heme/Lymph Last edited by  Sylvan Evener, COA on 05/01/2024  2:25 PM.      ALLERGIES Allergies  Allergen Reactions   Chicken Allergy Other (See Comments)    headache   Perflutren  Other (See Comments)    Pt with back pain as soon as administered.- patient refuted this in 2024   PAST MEDICAL HISTORY Past Medical History:  Diagnosis Date   Acute pyelonephritis    Bacteremia, escherichia coli 01/12/2015   Chest pain    Critical lower limb ischemia (HCC)    Hyperlipidemia    Hypertension    Left  carotid bruit    PAD (peripheral artery disease) (HCC)    Type II diabetes mellitus (HCC)    Past Surgical History:  Procedure Laterality Date   ABDOMINAL ANGIOGRAM  05/16/2015   Procedure: Abdominal Angiogram;  Surgeon: Avanell Leigh, MD;  Location: [facility];  Service: Cardiovascular;;   ABDOMINAL HYSTERECTOMY  ~ 2000   CHOLECYSTECTOMY N/A 09/23/2023   Procedure: LAPAROSCOPIC CHOLECYSTECTOMY;  Surgeon: Kinsinger, Alphonso Aschoff, MD;  Location: [facility] OR;  Service: General;  Laterality: N/A;   IR EXCHANGE BILIARY DRAIN  07/26/2023   IR FLUORO GUIDE CV LINE RIGHT  05/17/2023   IR PERC CHOLECYSTOSTOMY  05/23/2023   IR US  GUIDE VASC ACCESS RIGHT  05/17/2023   PERIPHERAL VASCULAR CATHETERIZATION Bilateral 05/16/2015   Procedure: Lower Extremity Angiography;  Surgeon: Avanell Leigh, MD; renal arteries widely patent, L-SFA 75%, L-pop 95%, L-peroneal 90%; R-SFA 40%, R-pop 50%, R-prox peroneal 90%; directional atherectomy L-SFA, p-pop, drug-eluting balloon angioplasty reducing the stenoses to 0, small linear dissection was not flow limiting       PERIPHERAL VASCULAR CATHETERIZATION Left 05/16/2015   Procedure: Peripheral Vascular Atherectomy;  Surgeon: Avanell Leigh, MD;  Location: [facility];  Service: Cardiovascular;  Laterality: Left;  sfa   FAMILY HISTORY Family History  Problem Relation Age of Onset   Hypertension Mother    SOCIAL HISTORY Social History   Tobacco Use   Smoking status: Never   Smokeless tobacco: Never  Vaping Use   Vaping status: Never Used  Substance Use Topics   Alcohol  use: Not Currently    Alcohol /week: 1.0 - 2.0 standard drink of alcohol     Types: 1 - 2 Cans of beer per week    Comment: occas   Drug use: No       OPHTHALMIC EXAM:  Base Eye Exam     Visual Acuity (Snellen - Linear)       Right Left   Dist [place] 20/30 -2         Tonometry (Tonopen, 2:21 PM)       Right Left   Pressure 52,48,47 17  Cosopt  and Brimonidine  given @ 2:22  pm.        Pupils       Dark Light Shape React APD   Right 5 5 Round NR None   Left 3 2 Round Brisk None         Visual Fields (Counting fingers)       Left Right    Full    Restrictions  Total superior temporal, inferior temporal, superior nasal, inferior nasal deficiencies         Extraocular Movement       Right Left    Full, Ortho Full, Ortho         Neuro/Psych     Oriented x3: Yes   Mood/Affect: Normal  Slit Lamp and Fundus Exam     Slit Lamp Exam       Right Left   Lids/Lashes Dermatochalasis - upper lid Dermatochalasis - upper lid   Conjunctiva/Sclera nasal and temporal pinguecula, prominent vessel ST quad nasal and temporal pinguecula, mild melanosis, Trace Injection   Cornea trace PEE, mild tear film debris well healed cataract wound   Anterior Chamber deep, closed angle temporally deep and clear   Iris Round and dilated, 360 NVI regressing Round and dilated, No NVI   Lens 3-4+ Nuclear sclerosis with brunescence, 3-4+ Cortical cataract, 2+ Posterior subcapsular cataract PC IOL in good position, trace posterior capsular opacification   Anterior Vitreous Vitreous syneresis Vitreous syneresis, PVD, Vitreous condensation         Fundus Exam       Right Left   Disc hazy view, 3-4+Pallor, severely attenuated vessels, +cupping 2+ Pallor, Sharp rim, mild PPA   C/D Ratio 0.9 0.3   Macula hazy view, grossly flat, diffuse atrophy, no heme Flat, Good foveal reflex, scattered MA greatest temporal, +cystic changes--persistent   Vessels Severe attenuation attenuated, Tortuous, focal sclerosis temporal macula   Periphery very hazy view, attached, Attached, rare MA           IMAGING AND PROCEDURES  Imaging and Procedures for 05/01/2024  OCT, Retina - OU - Both Eyes       Right Eye Quality was poor. Central Foveal Thickness: 292. Progression has been stable. Findings include normal foveal contour, no IRF, no SRF, epiretinal membrane, outer  retinal atrophy (Grainy images, retina attached, diffuse IRA / ischemia, no obvious IRF/SRF).   Left Eye Quality was good. Central Foveal Thickness: 271. Progression has been stable. Findings include normal foveal contour, no SRF, intraretinal hyper-reflective material, intraretinal fluid (Persistent Mild IRF/edema temporal macula ).   Notes *Images captured and stored on drive  Diagnosis / Impression:  OD: Grainy images, retina attached, diffuse IRA / ischemia, no obvious IRF/SRF - no DME OS: Persistent Mild IRF/edema temporal macula   Clinical management:  See below  Abbreviations: NFP - Normal foveal profile. CME - cystoid macular edema. PED - pigment epithelial detachment. IRF - intraretinal fluid. SRF - subretinal fluid. EZ - ellipsoid zone. ERM - epiretinal membrane. ORA - outer retinal atrophy. ORT - outer retinal tubulation. SRHM - subretinal hyper-reflective material. IRHM - intraretinal hyper-reflective material      Intravitreal Injection, Pharmacologic Agent - OD - Right Eye       Time Out 05/01/2024. 3:32 PM. Confirmed correct patient, procedure, site, and patient consented.   Anesthesia Topical anesthesia was used. Anesthetic medications included Lidocaine  2%, Proparacaine 0.5%.   Procedure Preparation included 5% betadine to ocular surface, eyelid speculum. A supplied needle was used.   Injection: 1.25 mg Bevacizumab  1.25mg /0.75ml   Route: Intravitreal, Site: Right Eye   NDC: C2662926, Lot: [id number], Expiration date: 05/25/2024   Post-op Post injection exam found visual acuity of at least counting fingers. The patient tolerated the procedure well. There were no complications. The patient received written and verbal post procedure care education.   Notes An AC tap was performed following injection due to elevated IOP using a 30 gauge needle on a syringe with the plunger removed. The needle was placed at the limbus at 7 oclock and approximately 0.11cc of aqueous was  removed from the anterior chamber. Betadine was applied to the tap area before and after the paracentesis was performed. There were no complications. The patient tolerated the procedure well. The IOP  was rechecked and was found to be ~12 mmHg by digital palpation.      Intravitreal Injection, Pharmacologic Agent - OS - Left Eye       Time Out 05/01/2024. 3:33 PM. Confirmed correct patient, procedure, site, and patient consented.   Anesthesia Topical anesthesia was used. Anesthetic medications included Lidocaine  2%, Proparacaine 0.5%.   Procedure Preparation included 5% betadine to ocular surface, eyelid speculum. A (32g) needle was used.   Injection: 1.25 mg Bevacizumab  1.25mg /0.66ml   Route: Intravitreal, Site: Left Eye   NDC: C2662926, Lot: [id number], Expiration date: 08/01/2024   Post-op Post injection exam found visual acuity of at least counting fingers. The patient tolerated the procedure well. There were no complications. The patient received written and verbal post procedure care education.           ASSESSMENT/PLAN:   ICD-10-CM   1. 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 Neovascular glaucoma of right eye, severe stage  H40.51X3 Intravitreal Injection, Pharmacologic Agent - OD - Right Eye    Bevacizumab  (AVASTIN ) SOLN 1.25 mg    4. 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 Current use of insulin  (HCC)  Z79.4     6. Long-term (current) use of injectable non-insulin  antidiabetic drugs  Z79.85     7.  Hypertensive retinopathy of both eyes  H35.033     8. Essential hypertension  I10     9. Combined forms of age-related cataract of right eye  H25.811     10. Pseudophakia  Z96.1       1-6. Proliferative diabetic retinopathy OD        Severe NPDR OS  - previously managed at [facility] -- referred here by [facility] due to insurance  - h/o IVE OS x4, last injection 12.11.24 w/ Dr. Lydia Sams  - here s/p IVA OU #1 (02.05.25) #2(03.10.25), #3 (04.07.25) - initial exam showed +NVI w/ closed angles OD, IOP OD 40+ -- +neovascular glaucoma; OS with scattered MA/DBH, no NV - FA (02.05.25) OD: no images obtained (dense cataract); OS: no NV; scattered patches of vascular nonperfusion; scattered late leaking MA -- would likely benefit from PRP OS to areas of vascular nonperfusion - OCT shows OD: Grainy images, retina attached, diffuse IRA / ischemia, no obvious IRF/SRF - no DME; OS: Mild IRF/edema temporal macula -- improved - BCVA OD LP, OS 20/25 - IOP OD 47,48, OS 17 - recommend IVA OU #4 today, 05.05.25 -- OD for NVI / neovascular glaucoma; OS for DME - pt wishes to proceed with injections - RBA of procedure discussed, questions answered - IVA informed consent obtained and signed, 02.05.25 (OU) - see procedure note - AC tap OD post-injection - cont Cosopt  and Brimonidine  TID OD for NVG - will refer to [facility] service for urgent NVG consult, possible CPC laser - f/u 4 wks, DFE, OCT, possible injection(s)  7,8. Hypertensive retinopathy OU - discussed importance of tight BP control  - monitor  9. Mixed Cataract  OD - The symptoms of cataract, surgical options, and treatments and risks were discussed with patient. - discussed diagnosis and progression - under the expert management of Dr. Candi Chafe  10. Pseudophakia OS  - s/p CE/IOL OS (January 2025, Dr. Candi Chafe)  - IOL in good position, doing well  - monitor  Ophthalmic Meds Ordered this visit:  Meds ordered this encounter   Medications   Bevacizumab  (AVASTIN ) SOLN 1.25 mg   Bevacizumab  (AVASTIN ) SOLN 1.25 mg    Return in about 4 weeks (around 05/29/2024) for NPDR/PDR, DFE, OCT, likely IVA OU.  There are no Patient Instructions on file for this visit.  Jeanice Millard, M.D., Ph.D. Diseases & Surgery of the Retina and Vitreous [facility]  I have reviewed the above documentation for accuracy and completeness, and I agree with the above. Jeanice Millard, M.D., Ph.D. 05/01/24 5:03 PM   Abbreviations: M myopia (nearsighted); A astigmatism; H hyperopia (farsighted); P presbyopia; Mrx spectacle prescription;  CTL contact lenses; OD right eye; OS left eye; OU both eyes  XT exotropia; ET esotropia; PEK punctate epithelial keratitis; PEE punctate epithelial erosions; DES dry eye syndrome; MGD meibomian gland dysfunction; ATs artificial tears; PFAT's preservative free artificial tears; NSC nuclear sclerotic cataract; PSC posterior subcapsular cataract; ERM epi-retinal membrane; PVD posterior vitreous detachment; RD retinal detachment; DM diabetes mellitus; DR diabetic retinopathy; NPDR non-proliferative diabetic retinopathy; PDR proliferative diabetic retinopathy; CSME clinically significant macular edema; DME diabetic macular edema; dbh dot blot hemorrhages; CWS cotton wool spot; POAG primary open angle glaucoma; C/D cup-to-disc ratio; HVF humphrey visual field; GVF goldmann visual field; OCT optical coherence tomography; IOP intraocular pressure; BRVO Branch retinal vein occlusion; CRVO central retinal vein occlusion; CRAO central retinal artery occlusion; BRAO branch retinal artery occlusion; RT retinal tear; SB scleral buckle; PPV pars plana vitrectomy; VH Vitreous hemorrhage; PRP panretinal laser photocoagulation; IVK intravitreal kenalog; VMT vitreomacular traction; MH Macular hole;  NVD neovascularization of the disc; NVE neovascularization elsewhere; AREDS age related eye disease study; ARMD age  related macular degeneration; POAG primary open angle glaucoma; EBMD epithelial/anterior basement membrane dystrophy; ACIOL anterior chamber intraocular lens; IOL intraocular lens; PCIOL posterior chamber intraocular lens; Phaco/IOL phacoemulsification with intraocular lens placement; PRK photorefractive keratectomy; LASIK laser assisted in situ keratomileusis; HTN hypertension; DM diabetes mellitus; COPD chronic obstructive pulmonary disease

## 2024-05-01 ENCOUNTER — Ambulatory Visit (INDEPENDENT_AMBULATORY_CARE_PROVIDER_SITE_OTHER): Admitting: Ophthalmology

## 2024-05-01 ENCOUNTER — Encounter (INDEPENDENT_AMBULATORY_CARE_PROVIDER_SITE_OTHER): Payer: Self-pay | Admitting: Ophthalmology

## 2024-05-01 DIAGNOSIS — E113412 Type 2 diabetes mellitus with severe nonproliferative diabetic retinopathy with macular edema, left eye: Secondary | ICD-10-CM | POA: Diagnosis not present

## 2024-05-01 DIAGNOSIS — Z961 Presence of intraocular lens: Secondary | ICD-10-CM

## 2024-05-01 DIAGNOSIS — I10 Essential (primary) hypertension: Secondary | ICD-10-CM

## 2024-05-01 DIAGNOSIS — H211X1 Other vascular disorders of iris and ciliary body, right eye: Secondary | ICD-10-CM | POA: Diagnosis not present

## 2024-05-01 DIAGNOSIS — E113591 Type 2 diabetes mellitus with proliferative diabetic retinopathy without macular edema, right eye: Secondary | ICD-10-CM | POA: Diagnosis not present

## 2024-05-01 DIAGNOSIS — H4051X3 Glaucoma secondary to other eye disorders, right eye, severe stage: Secondary | ICD-10-CM | POA: Diagnosis not present

## 2024-05-01 DIAGNOSIS — H35033 Hypertensive retinopathy, bilateral: Secondary | ICD-10-CM | POA: Diagnosis not present

## 2024-05-01 DIAGNOSIS — H25811 Combined forms of age-related cataract, right eye: Secondary | ICD-10-CM

## 2024-05-01 DIAGNOSIS — Z794 Long term (current) use of insulin: Secondary | ICD-10-CM

## 2024-05-01 DIAGNOSIS — Z7985 Long-term (current) use of injectable non-insulin antidiabetic drugs: Secondary | ICD-10-CM

## 2024-05-01 MED ORDER — BEVACIZUMAB CHEMO INJECTION 1.25MG/0.05ML SYRINGE FOR KALEIDOSCOPE
1.2500 mg | INTRAVITREAL | Status: AC | PRN
  Administered 2024-05-01: 1.25 mg via INTRAVITREAL

## 2024-05-18 NOTE — Progress Notes (Signed)
 [facility] - Clinic Note  05/29/2024   CHIEF COMPLAINT Patient presents for Retina Follow Up  HISTORY OF PRESENT ILLNESS: Samantha [name] is a 73 y.o. female who presents to the clinic today for:  HPI     Retina Follow Up   Patient presents with  Diabetic Retinopathy.  In both eyes.  This started 4 weeks ago.  Duration of 4 weeks.  Since onset it is stable.  I, the attending physician,  performed the HPI with the patient and updated documentation appropriately.        Comments   4 week retina follow up NPDR and IVA OU pt is reporting no vision changes noticed pt is using Cosopt  and Brimonidine  TID OD      Last edited by Ronelle Coffee, MD on 05/30/2024 12:58 AM.    Pt states she has not made it to [facility] for eval  Referring physician: Paseda, Folashade R, FNP [phone] [address] [place],  [place] [address]  HISTORICAL INFORMATION:  Selected notes from the MEDICAL RECORD NUMBER Referred by [facility]: Dec 08, 2023 at [facility] Ocular Hx- previously managed by [facility] -- h/o anti-VEGF therapy OS x4, last injection was IVE OS on 12.11.24 PMH-   CURRENT MEDICATIONS: Current Outpatient Medications (Ophthalmic Drugs)  Medication Sig   latanoprost (XALATAN) 0.005 % ophthalmic solution Place 1 drop into the right eye at bedtime.   brimonidine  (ALPHAGAN ) 0.2 % ophthalmic solution Place 1 drop into the right eye 2 (two) times daily.   ciprofloxacin  (CILOXAN ) 0.3 % ophthalmic solution Administer 2 drop into the right eye four times daily for  7 days   dorzolamide -timolol  (COSOPT ) 2-0.5 % ophthalmic solution Place 1 drop into the right eye 2 (two) times daily.   ofloxacin (OCUFLOX) 0.3 % ophthalmic solution Place 1 drop into the left eye 4 (four) times daily.   No current facility-administered medications for this visit. (Ophthalmic Drugs)   Current Outpatient Medications (Other)  Medication Sig   acetaminophen  (TYLENOL ) 325  MG tablet Take 1-2 tablets (325-650 mg total) by mouth every 4 (four) hours as needed for mild pain.   atorvastatin  (LIPITOR ) 80 MG tablet Take 1 tablet (80 mg total) by mouth daily. (Patient taking differently: Take 40 mg by mouth daily.)   carvedilol  (COREG ) 6.25 MG tablet Take 1 tablet (6.25 mg total) by mouth 2 (two) times daily with a meal.   clopidogrel  (PLAVIX ) 75 MG tablet TAKE 1 TABLET BY MOUTH EVERY DAY   insulin  glargine (LANTUS ) 100 UNIT/ML Solostar Pen Inject 15 Units into the skin daily.   lisinopril  (ZESTRIL ) 2.5 MG tablet Take 2.5 mg by mouth daily.   Multiple Vitamin (MULTIVITAMIN) capsule Take 1 capsule by mouth daily.   ondansetron  (ZOFRAN ) 4 MG tablet Take 1 tablet (4 mg total) by mouth every 6 (six) hours as needed for nausea. (Patient not taking: Reported on 09/15/2023)   oxyCODONE  (OXY IR/ROXICODONE ) 5 MG immediate release tablet Take 1 tablet (5 mg total) by mouth every 6 (six) hours as needed for severe pain.   Semaglutide (OZEMPIC, 0.25 OR 0.5 MG/DOSE, [place]) Inject 0.25 mg into the skin once a week.   sevelamer  carbonate (RENVELA ) 800 MG tablet TAKE 1 TABLET BY MOUTH 3 TIMES DAILY WITH MEALS.   sodium chloride  flush 0.9 % SOLN injection Flush cholecystectomy tube as directed by general surgery   No current facility-administered medications for this visit. (Other)   REVIEW OF SYSTEMS: ROS  Positive for: Endocrine, Eyes Negative for: Constitutional, Gastrointestinal, Neurological, Skin, Genitourinary, Musculoskeletal, HENT, Cardiovascular, Respiratory, Psychiatric, Allergic/Imm, Heme/Lymph Last edited by Alise Appl, COT on 05/29/2024  1:40 PM.     ALLERGIES Allergies  Allergen Reactions   Chicken Allergy Other (See Comments)    headache   Perflutren  Other (See Comments)    Pt with back pain as soon as administered.- patient refuted this in 2024   PAST MEDICAL HISTORY Past Medical History:  Diagnosis Date   Acute pyelonephritis    Bacteremia,  escherichia coli 01/12/2015   Chest pain    Critical lower limb ischemia (HCC)    Hyperlipidemia    Hypertension    Left carotid bruit    PAD (peripheral artery disease) (HCC)    Type II diabetes mellitus (HCC)    Past Surgical History:  Procedure Laterality Date   ABDOMINAL ANGIOGRAM  05/16/2015   Procedure: Abdominal Angiogram;  Surgeon: Avanell Leigh, MD;  Location: [facility];  Service: Cardiovascular;;   ABDOMINAL HYSTERECTOMY  ~ 2000   CHOLECYSTECTOMY N/A 09/23/2023   Procedure: LAPAROSCOPIC CHOLECYSTECTOMY;  Surgeon: Kinsinger, Alphonso Aschoff, MD;  Location: [facility] OR;  Service: General;  Laterality: N/A;   IR EXCHANGE BILIARY DRAIN  07/26/2023   IR FLUORO GUIDE CV LINE RIGHT  05/17/2023   IR PERC CHOLECYSTOSTOMY  05/23/2023   IR US  GUIDE VASC ACCESS RIGHT  05/17/2023   PERIPHERAL VASCULAR CATHETERIZATION Bilateral 05/16/2015   Procedure: Lower Extremity Angiography;  Surgeon: Avanell Leigh, MD; renal arteries widely patent, L-SFA 75%, L-pop 95%, L-peroneal 90%; R-SFA 40%, R-pop 50%, R-prox peroneal 90%; directional atherectomy L-SFA, p-pop, drug-eluting balloon angioplasty reducing the stenoses to 0, small linear dissection was not flow limiting       PERIPHERAL VASCULAR CATHETERIZATION Left 05/16/2015   Procedure: Peripheral Vascular Atherectomy;  Surgeon: Avanell Leigh, MD;  Location: [facility];  Service: Cardiovascular;  Laterality: Left;  sfa   FAMILY HISTORY Family History  Problem Relation Age of Onset   Hypertension Mother    SOCIAL HISTORY Social History   Tobacco Use   Smoking status: Never   Smokeless tobacco: Never  Vaping Use   Vaping status: Never Used  Substance Use Topics   Alcohol  use: Not Currently    Alcohol /week: 1.0 - 2.0 standard drink of alcohol     Types: 1 - 2 Cans of beer per week    Comment: occas   Drug use: No       OPHTHALMIC EXAM:  Base Eye Exam     Visual Acuity (Snellen - Linear)       Right Left   Dist [place] LP 20/25  -2   Dist ph [place]  NI         Tonometry (Tonopen, 1:46 PM)       Right Left   Pressure 41 17  1 gtss dorz/brim OD         Pupils       Pupils Dark Light Shape React   Right PERRL 5 5 Round NR   Left PERRL 3 2 Round Brisk         Visual Fields       Left Right   Restrictions  Total superior temporal, inferior temporal, superior nasal, inferior nasal deficiencies         Extraocular Movement       Right Left    Full Full         Neuro/Psych  Oriented x3: Yes   Mood/Affect: Normal         Dilation     Both eyes: 2.5% Phenylephrine @ 1:46 PM           Slit Lamp and Fundus Exam     Slit Lamp Exam       Right Left   Lids/Lashes Dermatochalasis - upper lid Dermatochalasis - upper lid   Conjunctiva/Sclera nasal and temporal pinguecula nasal and temporal pinguecula, mild melanosis, Trace Injection   Cornea trace PEE, mild tear film debris well healed cataract wound   Anterior Chamber deep, closed angles deep and clear   Iris Round and dilated, 360 NVI regressing Round and dilated, No NVI   Lens 3-4+ Nuclear sclerosis with brunescence, 3-4+ Cortical cataract, 2+ Posterior subcapsular cataract PC IOL in good position, trace posterior capsular opacification   Anterior Vitreous Vitreous syneresis Vitreous syneresis, PVD, Vitreous condensation         Fundus Exam       Right Left   Disc hazy view, 3-4+Pallor, severely attenuated vessels, +cupping 2+ Pallor, Sharp rim, mild PPA   C/D Ratio 0.9 0.3   Macula hazy view, grossly flat, diffuse atrophy, no heme Flat, Good foveal reflex, scattered MA greatest temporal, +cystic changes -- persistent   Vessels Severe attenuation attenuated, Tortuous, focal sclerosis temporal macula   Periphery very hazy view, attached, Attached, rare MA           IMAGING AND PROCEDURES  Imaging and Procedures for 05/29/2024  OCT, Retina - OU - Both Eyes        Right Eye Quality was poor. Central Foveal Thickness:  948. Progression has been stable. Findings include normal foveal contour, no IRF, no SRF, epiretinal membrane, outer retinal atrophy (Very grainy images, retina attached, diffuse IRA / ischemia, no obvious IRF/SRF).   Left Eye Quality was good. Central Foveal Thickness: 268. Progression has been stable. Findings include normal foveal contour, no SRF, intraretinal hyper-reflective material, intraretinal fluid (Persistent Mild IRF/edema temporal macula ).   Notes  *Images captured and stored on drive  Diagnosis / Impression:  OD: very grainy images, retina attached, diffuse IRA / ischemia, no obvious IRF/SRF - no DME OS: Persistent Mild IRF/edema temporal macula   Clinical management:  See below  Abbreviations: NFP - Normal foveal profile. CME - cystoid macular edema. PED - pigment epithelial detachment. IRF - intraretinal fluid. SRF - subretinal fluid. EZ - ellipsoid zone. ERM - epiretinal membrane. ORA - outer retinal atrophy. ORT - outer retinal tubulation. SRHM - subretinal hyper-reflective material. IRHM - intraretinal hyper-reflective material      Intravitreal Injection, Pharmacologic Agent - OD - Right Eye        Time Out 05/29/2024. 3:22 PM. Confirmed correct patient, procedure, site, and patient consented.   Anesthesia Topical anesthesia was used. Anesthetic medications included Lidocaine  2%, Proparacaine 0.5%.   Procedure Preparation included 5% betadine to ocular surface, eyelid speculum. A (32g) needle was used.   Injection: 2 mg aflibercept 2 MG/0.05ML   Route: Intravitreal, Site: Right Eye   NDC: D2246706, Lot: [id number], Expiration date: 08/27/2025, Waste: 0 mL   Post-op Post injection exam found visual acuity of at least counting fingers. The patient tolerated the procedure well. There were no complications. The patient received written and verbal post procedure care education.   Notes  An AC tap was performed following injection due to elevated IOP using  a 30 gauge needle on a syringe with the plunger removed. The needle was  placed at the limbus at 7 oclock and approximately 0.18cc of aqueous was removed from the anterior chamber. Betadine was applied to the tap area before and after the paracentesis was performed. There were no complications. The patient tolerated the procedure well. The IOP was rechecked and was found to be ~10 mmHg by digital palpation.      Intravitreal Injection, Pharmacologic Agent - OS - Left Eye       Time Out 05/29/2024. 3:22 PM. Confirmed correct patient, procedure, site, and patient consented.   Anesthesia Topical anesthesia was used. Anesthetic medications included Lidocaine  2%, Proparacaine 0.5%.   Procedure Preparation included 5% betadine to ocular surface, eyelid speculum. A (32g) needle was used.   Injection: 2 mg aflibercept 2 MG/0.05ML   Route: Intravitreal, Site: Left Eye   NDC: Q956576, Lot: [id number], Expiration date: 06/26/2025, Waste: 0 mL   Post-op Post injection exam found visual acuity of at least counting fingers. The patient tolerated the procedure well. There were no complications. The patient received written and verbal post procedure care education.           ASSESSMENT/PLAN:   ICD-10-CM   1. 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 Neovascular glaucoma of right eye, severe stage  H40.51X3     4. 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 Current use of insulin  (HCC)  Z79.4     6. Long-term  (current) use of injectable non-insulin  antidiabetic drugs  Z79.85     7. Hypertensive retinopathy of both eyes  H35.033     8. Essential hypertension  I10     9. Combined forms of age-related cataract of right eye  H25.811     10. Pseudophakia  Z96.1      1-6. Proliferative diabetic retinopathy OD        Severe NPDR OS  - previously managed at [facility] -- [facility] here by [facility] due to insurance  - h/o IVE OS x4, last injection 12.11.24 w/ Dr. Lydia Sams  - here s/p IVA OU #1 (02.05.25) #2(03.10.25), #3 (04.07.25), #4 (05.05.25) - initial exam showed +NVI w/ closed angles OD, IOP OD 40+ -- +neovascular glaucoma; OS with scattered MA/DBH, no NV - FA (02.05.25) OD: no images obtained (dense cataract); OS: no NV; scattered patches of vascular nonperfusion; scattered late leaking MA -- would likely benefit from PRP OS to areas of vascular nonperfusion - OCT shows OD: very grainy images, retina attached, diffuse IRA / ischemia, no obvious IRF/SRF - no DME; OS: Mild IRF/edema temporal macula -- improved **discussed decreased efficacy / resistance to Avastin  and potential benefit of switching from Avastin  to Eylea** - BCVA OD LP, OS 20/25 - IOP OD 41, OS 17 - recommend switching to IVE OU #1 today, 06.02.25 -- OD for NVI / neovascular glaucoma; OS for DME - pt wishes to proceed with injections - RBA of procedure discussed, questions answered - IVA informed consent obtained and  signed, 02.05.25 (OU) - see procedure note - AC tap OD post-injection - cont Cosopt  and Brimonidine  TID OD for NVG; will add latanoprost at bedtime OD - will refer to [facility] service for urgent NVG consult, possible CPC laser - f/u 4 wks, DFE, OCT, possible injection(s)  7,8. Hypertensive retinopathy OU - discussed importance of tight BP control  - monitor  9. Mixed Cataract OD - The symptoms of cataract, surgical options, and treatments and risks were discussed with patient. - discussed  diagnosis and progression - under the expert management of Dr. Candi Chafe  10. Pseudophakia OS  - s/p CE/IOL OS (January 2025, Dr. Candi Chafe)  - IOL in good position, doing well  - monitor  Ophthalmic Meds Ordered this visit:  Meds ordered this encounter  Medications   latanoprost (XALATAN) 0.005 % ophthalmic solution    Sig: Place 1 drop into the right eye at bedtime.    Dispense:  7.5 mL    Refill:  5   aflibercept (EYLEA) SOLN 2 mg   aflibercept (EYLEA) SOLN 2 mg    Return in about 4 weeks (around 06/26/2024) for f/u PDR OU, DFE, OCT, Possible Injxn.  There are no Patient Instructions on file for this visit.  This document serves as a record of services personally performed by Jeanice Millard, MD, PhD. It was created on their behalf by Mayola Specking, COA an ophthalmic technician. The creation of this record is the provider's dictation and/or activities during the visit.   Electronically signed by: Carrington Clack, COT  05/30/24  11:47 PM   This document serves as a record of services personally performed by Jeanice Millard, MD, PhD. It was created on their behalf by Morley Arabia. Bevin Bucks, OA an ophthalmic technician. The creation of this record is the provider's dictation and/or activities during the visit.    Electronically signed by: Morley Arabia. Bevin Bucks, OA 05/30/24 11:47 PM  Jeanice Millard, M.D., Ph.D. Diseases & Surgery of the Retina and Vitreous [facility]  I have reviewed the above documentation for accuracy and completeness, and I agree with the above. Jeanice Millard, M.D., Ph.D. 05/30/24 11:51 PM    Abbreviations: M myopia (nearsighted); A astigmatism; H hyperopia (farsighted); P presbyopia; Mrx spectacle prescription;  CTL contact lenses; OD right eye; OS left eye; OU both eyes  XT exotropia; ET esotropia; PEK punctate epithelial keratitis; PEE punctate epithelial erosions; DES dry eye syndrome; MGD meibomian gland dysfunction; ATs artificial tears; PFAT's  preservative free artificial tears; NSC nuclear sclerotic cataract; PSC posterior subcapsular cataract; ERM epi-retinal membrane; PVD posterior vitreous detachment; RD retinal detachment; DM diabetes mellitus; DR diabetic retinopathy; NPDR non-proliferative diabetic retinopathy; PDR proliferative diabetic retinopathy; CSME clinically significant macular edema; DME diabetic macular edema; dbh dot blot hemorrhages; CWS cotton wool spot; POAG primary open angle glaucoma; C/D cup-to-disc ratio; HVF humphrey visual field; GVF goldmann visual field; OCT optical coherence tomography; IOP intraocular pressure; BRVO Branch retinal vein occlusion; CRVO central retinal vein occlusion; CRAO central retinal artery occlusion; BRAO branch retinal artery occlusion; RT retinal tear; SB scleral buckle; PPV pars plana vitrectomy; VH Vitreous hemorrhage; PRP panretinal laser photocoagulation; IVK intravitreal kenalog; VMT vitreomacular traction; MH Macular hole;  NVD neovascularization of the disc; NVE neovascularization elsewhere; AREDS age related eye disease study; ARMD age related macular degeneration; POAG primary open angle glaucoma; EBMD epithelial/anterior basement membrane dystrophy; ACIOL anterior chamber intraocular lens; IOL intraocular lens; PCIOL posterior chamber intraocular lens; Phaco/IOL phacoemulsification with intraocular lens  placement; PRK photorefractive keratectomy; LASIK laser assisted in situ keratomileusis; HTN hypertension; DM diabetes mellitus; COPD chronic obstructive pulmonary disease

## 2024-05-29 ENCOUNTER — Ambulatory Visit (INDEPENDENT_AMBULATORY_CARE_PROVIDER_SITE_OTHER): Admitting: Ophthalmology

## 2024-05-29 ENCOUNTER — Encounter (INDEPENDENT_AMBULATORY_CARE_PROVIDER_SITE_OTHER): Payer: Self-pay | Admitting: Ophthalmology

## 2024-05-29 DIAGNOSIS — Z961 Presence of intraocular lens: Secondary | ICD-10-CM

## 2024-05-29 DIAGNOSIS — Z7985 Long-term (current) use of injectable non-insulin antidiabetic drugs: Secondary | ICD-10-CM

## 2024-05-29 DIAGNOSIS — E113591 Type 2 diabetes mellitus with proliferative diabetic retinopathy without macular edema, right eye: Secondary | ICD-10-CM

## 2024-05-29 DIAGNOSIS — E113412 Type 2 diabetes mellitus with severe nonproliferative diabetic retinopathy with macular edema, left eye: Secondary | ICD-10-CM | POA: Diagnosis not present

## 2024-05-29 DIAGNOSIS — I10 Essential (primary) hypertension: Secondary | ICD-10-CM

## 2024-05-29 DIAGNOSIS — H211X1 Other vascular disorders of iris and ciliary body, right eye: Secondary | ICD-10-CM

## 2024-05-29 DIAGNOSIS — H4051X3 Glaucoma secondary to other eye disorders, right eye, severe stage: Secondary | ICD-10-CM

## 2024-05-29 DIAGNOSIS — H35033 Hypertensive retinopathy, bilateral: Secondary | ICD-10-CM

## 2024-05-29 DIAGNOSIS — Z794 Long term (current) use of insulin: Secondary | ICD-10-CM

## 2024-05-29 DIAGNOSIS — H25811 Combined forms of age-related cataract, right eye: Secondary | ICD-10-CM

## 2024-05-29 MED ORDER — LATANOPROST 0.005 % OP SOLN: 1.0000 [drp] | Freq: Every day | OPHTHALMIC | 5 refills | Status: DC

## 2024-05-30 ENCOUNTER — Encounter (INDEPENDENT_AMBULATORY_CARE_PROVIDER_SITE_OTHER): Payer: Self-pay | Admitting: Ophthalmology

## 2024-05-30 MED ORDER — AFLIBERCEPT 2MG/0.05ML IZ SOLN FOR KALEIDOSCOPE
2.0000 mg | INTRAVITREAL | Status: AC | PRN
  Administered 2024-05-30: 2 mg via INTRAVITREAL

## 2024-05-30 MED ORDER — AFLIBERCEPT 2MG/0.05ML IZ SOLN FOR KALEIDOSCOPE
2.0000 mg | INTRAVITREAL | Status: AC | PRN
Start: 2024-05-30 — End: 2024-05-30
  Administered 2024-05-30: 2 mg via INTRAVITREAL

## 2024-06-15 NOTE — Progress Notes (Shared)
 [facility] - Clinic Note  06/26/2024   CHIEF COMPLAINT Patient presents for No chief complaint on file.  HISTORY OF PRESENT ILLNESS: Samantha [name] is a 73 y.o. female who presents to the clinic today for:   Pt states she has not made it to [facility] for eval  Referring physician: Anthoney Kipper L, DO [address] [place],  [place] [address]  HISTORICAL INFORMATION:  Selected notes from the MEDICAL RECORD NUMBER Referred by [facility]: Dec 08, 2023 at [facility]x- previously managed by [facility] -- h/o anti-VEGF therapy OS x4, last injection was IVE OS on 12.11.24 PMH-   CURRENT MEDICATIONS: Current Outpatient Medications (Ophthalmic Drugs)  Medication Sig   brimonidine  (ALPHAGAN ) 0.2 % ophthalmic solution Place 1 drop into the right eye 2 (two) times daily.   ciprofloxacin  (CILOXAN ) 0.3 % ophthalmic solution Administer 2 drop into the right eye four times daily for  7 days   dorzolamide -timolol  (COSOPT ) 2-0.5 % ophthalmic solution Place 1 drop into the right eye 2 (two) times daily.   latanoprost  (XALATAN ) 0.005 % ophthalmic solution Place 1 drop into the right eye at bedtime.   ofloxacin (OCUFLOX) 0.3 % ophthalmic solution Place 1 drop into the left eye 4 (four) times daily.   No current facility-administered medications for this visit. (Ophthalmic Drugs)   Current Outpatient Medications (Other)  Medication Sig   acetaminophen  (TYLENOL ) 325 MG tablet Take 1-2 tablets (325-650 mg total) by mouth every 4 (four) hours as needed for mild pain.   atorvastatin  (LIPITOR ) 80 MG tablet Take 1 tablet (80 mg total) by mouth daily. (Patient taking differently: Take 40 mg by mouth daily.)   carvedilol  (COREG ) 6.25 MG tablet Take 1 tablet (6.25 mg total) by mouth 2 (two) times daily with a meal.   clopidogrel  (PLAVIX ) 75 MG tablet TAKE 1 TABLET BY MOUTH EVERY DAY   insulin  glargine (LANTUS ) 100 UNIT/ML Solostar Pen Inject 15 Units  into the skin daily.   lisinopril  (ZESTRIL ) 2.5 MG tablet Take 2.5 mg by mouth daily.   Multiple Vitamin (MULTIVITAMIN) capsule Take 1 capsule by mouth daily.   ondansetron  (ZOFRAN ) 4 MG tablet Take 1 tablet (4 mg total) by mouth every 6 (six) hours as needed for nausea. (Patient not taking: Reported on 09/15/2023)   oxyCODONE  (OXY IR/ROXICODONE ) 5 MG immediate release tablet Take 1 tablet (5 mg total) by mouth every 6 (six) hours as needed for severe pain.   Semaglutide (OZEMPIC, 0.25 OR 0.5 MG/DOSE, [place]) Inject 0.25 mg into the skin once a week.   sevelamer  carbonate (RENVELA ) 800 MG tablet TAKE 1 TABLET BY MOUTH 3 TIMES DAILY WITH MEALS.   sodium chloride  flush 0.9 % SOLN injection Flush cholecystectomy tube as directed by general surgery   No current facility-administered medications for this visit. (Other)   REVIEW OF SYSTEMS:   ALLERGIES Allergies  Allergen Reactions   Chicken Allergy Other (See Comments)    headache   Perflutren  Other (See Comments)    Pt with back pain as soon as administered.- patient refuted this in 2024   PAST MEDICAL HISTORY Past Medical History:  Diagnosis Date   Acute pyelonephritis    Bacteremia, escherichia coli 01/12/2015   Chest pain    Critical lower limb ischemia (HCC)    Hyperlipidemia    Hypertension    Left carotid bruit    PAD (peripheral artery disease) (HCC)    Type II diabetes mellitus (HCC)  Past Surgical History:  Procedure Laterality Date   ABDOMINAL ANGIOGRAM  05/16/2015   Procedure: Abdominal Angiogram;  Surgeon: Avanell Leigh, MD;  Location: [facility] INVASIVE CV LAB;  Service: Cardiovascular;;   ABDOMINAL HYSTERECTOMY  ~ 2000   CHOLECYSTECTOMY N/A 09/23/2023   Procedure: LAPAROSCOPIC CHOLECYSTECTOMY;  Surgeon: Kinsinger, Alphonso Aschoff, MD;  Location: [facility] OR;  Service: General;  Laterality: N/A;   IR EXCHANGE BILIARY DRAIN  07/26/2023   IR FLUORO GUIDE CV LINE RIGHT  05/17/2023   IR PERC CHOLECYSTOSTOMY  05/23/2023   IR US  GUIDE VASC  ACCESS RIGHT  05/17/2023   PERIPHERAL VASCULAR CATHETERIZATION Bilateral 05/16/2015   Procedure: Lower Extremity Angiography;  Surgeon: Avanell Leigh, MD; renal arteries widely patent, L-SFA 75%, L-pop 95%, L-peroneal 90%; R-SFA 40%, R-pop 50%, R-prox peroneal 90%; directional atherectomy L-SFA, p-pop, drug-eluting balloon angioplasty reducing the stenoses to 0, small linear dissection was not flow limiting       PERIPHERAL VASCULAR CATHETERIZATION Left 05/16/2015   Procedure: Peripheral Vascular Atherectomy;  Surgeon: Avanell Leigh, MD;  Location: [facility] INVASIVE CV LAB;  Service: Cardiovascular;  Laterality: Left;  sfa   FAMILY HISTORY Family History  Problem Relation Age of Onset   Hypertension Mother    SOCIAL HISTORY Social History   Tobacco Use   Smoking status: Never   Smokeless tobacco: Never  Vaping Use   Vaping status: Never Used  Substance Use Topics   Alcohol  use: Not Currently    Alcohol /week: 1.0 - 2.0 standard drink of alcohol     Types: 1 - 2 Cans of beer per week    Comment: occas   Drug use: No       OPHTHALMIC EXAM:  Not recorded    IMAGING AND PROCEDURES  Imaging and Procedures for 06/26/2024         ASSESSMENT/PLAN:   ICD-10-CM   1. 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 Neovascular glaucoma of right eye, severe stage  H40.51X3     4. 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 Current use of insulin  (HCC)  Z79.4     6. Long-term (current) use of injectable non-insulin  antidiabetic drugs  Z79.85     7. Hypertensive retinopathy of both eyes  H35.033     8. Essential hypertension  I10     9. Combined forms of age-related cataract of right eye  H25.811     10. Pseudophakia  Z96.1       1-6. Proliferative  diabetic retinopathy OD        Severe NPDR OS  - previously managed at [facility] -- [facility] here by [facility] due to insurance  - h/o IVE OS x4, last injection 12.11.24 w/ Dr. Lydia Sams  - s/p IVA OU #1 (02.05.25) #2(03.10.25), #3 (04.07.25), #4 (05.05.25) ===============  - s/p IVE OU #1 (06.02.25) - initial exam showed +NVI w/ closed angles OD, IOP OD 40+ -- +neovascular glaucoma; OS with scattered MA/DBH, no NV - FA (02.05.25) OD: no images obtained (dense cataract); OS: no NV; scattered patches of vascular nonperfusion; scattered late leaking MA -- would likely benefit from PRP OS to areas of vascular nonperfusion - OCT shows OD: very grainy images, retina attached, diffuse IRA / ischemia, no  obvious IRF/SRF - no DME; OS: Mild IRF/edema temporal macula -- improved **discussed decreased efficacy / resistance to Avastin  and potential benefit of switching from Avastin  to Eylea ** - BCVA OD LP, OS 20/25 - IOP OD 41, OS 17 - recommend switching to IVE OU #2 today, 06.30.25 -- OD for NVI / neovascular glaucoma; OS for DME - pt wishes to proceed with injections - RBA of procedure discussed, questions answered - IVA informed consent obtained and signed, 02.05.25 (OU) - see procedure note - AC tap OD post-injection - cont Cosopt  and Brimonidine  TID OD for NVG; will add latanoprost  at bedtime OD - will refer to [facility] Glaucoma service for urgent NVG consult, possible CPC laser - f/u 4 wks, DFE, OCT, possible injection(s)  7,8. Hypertensive retinopathy OU - discussed importance of tight BP control  - monitor  9. Mixed Cataract OD - The symptoms of cataract, surgical options, and treatments and risks were discussed with patient. - discussed diagnosis and progression - under the expert management of Dr. Candi Chafe  10. Pseudophakia OS  - s/p CE/IOL OS (January 2025, Dr. Candi Chafe)  - IOL in good position, doing well  - monitor  Ophthalmic Meds Ordered this visit:  No orders of the defined  types were placed in this encounter.   No follow-ups on file.  There are no Patient Instructions on file for this visit.  This document serves as a record of services personally performed by Jeanice Millard, MD, PhD. It was created on their behalf by Mayola Specking, COA an ophthalmic technician. The creation of this record is the provider's dictation and/or activities during the visit.   Electronically signed by: Carrington Clack, COT  06/15/24  4:12 PM     Jeanice Millard, M.D., Ph.D. Diseases & Surgery of the Retina and Vitreous [facility]     Abbreviations: M myopia (nearsighted); A astigmatism; H hyperopia (farsighted); P presbyopia; Mrx spectacle prescription;  CTL contact lenses; OD right eye; OS left eye; OU both eyes  XT exotropia; ET esotropia; PEK punctate epithelial keratitis; PEE punctate epithelial erosions; DES dry eye syndrome; MGD meibomian gland dysfunction; ATs artificial tears; PFAT's preservative free artificial tears; NSC nuclear sclerotic cataract; PSC posterior subcapsular cataract; ERM epi-retinal membrane; PVD posterior vitreous detachment; RD retinal detachment; DM diabetes mellitus; DR diabetic retinopathy; NPDR non-proliferative diabetic retinopathy; PDR proliferative diabetic retinopathy; CSME clinically significant macular edema; DME diabetic macular edema; dbh dot blot hemorrhages; CWS cotton wool spot; POAG primary open angle glaucoma; C/D cup-to-disc ratio; HVF humphrey visual field; GVF goldmann visual field; OCT optical coherence tomography; IOP intraocular pressure; BRVO Branch retinal vein occlusion; CRVO central retinal vein occlusion; CRAO central retinal artery occlusion; BRAO branch retinal artery occlusion; RT retinal tear; SB scleral buckle; PPV pars plana vitrectomy; VH Vitreous hemorrhage; PRP panretinal laser photocoagulation; IVK intravitreal kenalog; VMT vitreomacular traction; MH Macular hole;  NVD neovascularization of the  disc; NVE neovascularization elsewhere; AREDS age related eye disease study; ARMD age related macular degeneration; POAG primary open angle glaucoma; EBMD epithelial/anterior basement membrane dystrophy; ACIOL anterior chamber intraocular lens; IOL intraocular lens; PCIOL posterior chamber intraocular lens; Phaco/IOL phacoemulsification with intraocular lens placement; PRK photorefractive keratectomy; LASIK laser assisted in situ keratomileusis; HTN hypertension; DM diabetes mellitus; COPD chronic obstructive pulmonary disease

## 2024-06-26 ENCOUNTER — Encounter (INDEPENDENT_AMBULATORY_CARE_PROVIDER_SITE_OTHER): Admitting: Ophthalmology

## 2024-06-26 ENCOUNTER — Encounter (INDEPENDENT_AMBULATORY_CARE_PROVIDER_SITE_OTHER): Payer: Self-pay

## 2024-06-26 DIAGNOSIS — E113591 Type 2 diabetes mellitus with proliferative diabetic retinopathy without macular edema, right eye: Secondary | ICD-10-CM

## 2024-06-26 DIAGNOSIS — H35033 Hypertensive retinopathy, bilateral: Secondary | ICD-10-CM

## 2024-06-26 DIAGNOSIS — Z961 Presence of intraocular lens: Secondary | ICD-10-CM

## 2024-06-26 DIAGNOSIS — Z7985 Long-term (current) use of injectable non-insulin antidiabetic drugs: Secondary | ICD-10-CM

## 2024-06-26 DIAGNOSIS — H211X1 Other vascular disorders of iris and ciliary body, right eye: Secondary | ICD-10-CM

## 2024-06-26 DIAGNOSIS — Z794 Long term (current) use of insulin: Secondary | ICD-10-CM

## 2024-06-26 DIAGNOSIS — H25811 Combined forms of age-related cataract, right eye: Secondary | ICD-10-CM

## 2024-06-26 DIAGNOSIS — H4051X3 Glaucoma secondary to other eye disorders, right eye, severe stage: Secondary | ICD-10-CM

## 2024-06-26 DIAGNOSIS — I10 Essential (primary) hypertension: Secondary | ICD-10-CM

## 2024-06-26 DIAGNOSIS — E113412 Type 2 diabetes mellitus with severe nonproliferative diabetic retinopathy with macular edema, left eye: Secondary | ICD-10-CM

## 2024-06-28 ENCOUNTER — Encounter (INDEPENDENT_AMBULATORY_CARE_PROVIDER_SITE_OTHER): Admitting: Ophthalmology

## 2024-07-04 ENCOUNTER — Encounter (INDEPENDENT_AMBULATORY_CARE_PROVIDER_SITE_OTHER): Admitting: Ophthalmology

## 2024-07-04 NOTE — Progress Notes (Shared)
 [facility] - Clinic Note  07/05/2024   CHIEF COMPLAINT Patient presents for No chief complaint on file.  HISTORY OF PRESENT ILLNESS: Samantha [name] is a 73 y.o. female who presents to the clinic today for:   Pt states she has not made it to [facility] for eval  Referring physician: Cloria Riggs L, DO [address] [place],  [place] [address]  HISTORICAL INFORMATION:  Selected notes from the MEDICAL RECORD NUMBER Referred by [facility]: Dec 08, 2023 at [facility]x- previously managed by [facility] -- h/o anti-VEGF therapy OS x4, last injection was IVE OS on 12.11.24 PMH-   CURRENT MEDICATIONS: Current Outpatient Medications (Ophthalmic Drugs)  Medication Sig   brimonidine  (ALPHAGAN ) 0.2 % ophthalmic solution Place 1 drop into the right eye 2 (two) times daily.   ciprofloxacin  (CILOXAN ) 0.3 % ophthalmic solution Administer 2 drop into the right eye four times daily for  7 days   dorzolamide -timolol  (COSOPT ) 2-0.5 % ophthalmic solution Place 1 drop into the right eye 2 (two) times daily.   latanoprost  (XALATAN ) 0.005 % ophthalmic solution Place 1 drop into the right eye at bedtime.   ofloxacin (OCUFLOX) 0.3 % ophthalmic solution Place 1 drop into the left eye 4 (four) times daily.   No current facility-administered medications for this visit. (Ophthalmic Drugs)   Current Outpatient Medications (Other)  Medication Sig   acetaminophen  (TYLENOL ) 325 MG tablet Take 1-2 tablets (325-650 mg total) by mouth every 4 (four) hours as needed for mild pain.   atorvastatin  (LIPITOR ) 80 MG tablet Take 1 tablet (80 mg total) by mouth daily. (Patient taking differently: Take 40 mg by mouth daily.)   carvedilol  (COREG ) 6.25 MG tablet Take 1 tablet (6.25 mg total) by mouth 2 (two) times daily with a meal.   clopidogrel  (PLAVIX ) 75 MG tablet TAKE 1 TABLET BY MOUTH EVERY DAY   insulin  glargine (LANTUS ) 100 UNIT/ML Solostar Pen Inject 15 Units  into the skin daily.   lisinopril  (ZESTRIL ) 2.5 MG tablet Take 2.5 mg by mouth daily.   Multiple Vitamin (MULTIVITAMIN) capsule Take 1 capsule by mouth daily.   ondansetron  (ZOFRAN ) 4 MG tablet Take 1 tablet (4 mg total) by mouth every 6 (six) hours as needed for nausea. (Patient not taking: Reported on 09/15/2023)   oxyCODONE  (OXY IR/ROXICODONE ) 5 MG immediate release tablet Take 1 tablet (5 mg total) by mouth every 6 (six) hours as needed for severe pain.   Semaglutide (OZEMPIC, 0.25 OR 0.5 MG/DOSE, [place]) Inject 0.25 mg into the skin once a week.   sevelamer  carbonate (RENVELA ) 800 MG tablet TAKE 1 TABLET BY MOUTH 3 TIMES DAILY WITH MEALS.   sodium chloride  flush 0.9 % SOLN injection Flush cholecystectomy tube as directed by general surgery   No current facility-administered medications for this visit. (Other)   REVIEW OF SYSTEMS:   ALLERGIES Allergies  Allergen Reactions   Chicken Allergy Other (See Comments)    headache   Perflutren  Other (See Comments)    Pt with back pain as soon as administered.- patient refuted this in 2024   PAST MEDICAL HISTORY Past Medical History:  Diagnosis Date   Acute pyelonephritis    Bacteremia, escherichia coli 01/12/2015   Chest pain    Critical lower limb ischemia (HCC)    Hyperlipidemia    Hypertension    Left carotid bruit    PAD (peripheral artery disease) (HCC)    Type II diabetes mellitus (HCC)  Past Surgical History:  Procedure Laterality Date   ABDOMINAL ANGIOGRAM  05/16/2015   Procedure: Abdominal Angiogram;  Surgeon: Dorn JINNY Lesches, MD;  Location: [facility] INVASIVE CV LAB;  Service: Cardiovascular;;   ABDOMINAL HYSTERECTOMY  ~ 2000   CHOLECYSTECTOMY N/A 09/23/2023   Procedure: LAPAROSCOPIC CHOLECYSTECTOMY;  Surgeon: Kinsinger, Herlene Righter, MD;  Location: [facility] OR;  Service: General;  Laterality: N/A;   IR EXCHANGE BILIARY DRAIN  07/26/2023   IR FLUORO GUIDE CV LINE RIGHT  05/17/2023   IR PERC CHOLECYSTOSTOMY  05/23/2023   IR US  GUIDE VASC  ACCESS RIGHT  05/17/2023   PERIPHERAL VASCULAR CATHETERIZATION Bilateral 05/16/2015   Procedure: Lower Extremity Angiography;  Surgeon: Dorn JINNY Lesches, MD; renal arteries widely patent, L-SFA 75%, L-pop 95%, L-peroneal 90%; R-SFA 40%, R-pop 50%, R-prox peroneal 90%; directional atherectomy L-SFA, p-pop, drug-eluting balloon angioplasty reducing the stenoses to 0, small linear dissection was not flow limiting       PERIPHERAL VASCULAR CATHETERIZATION Left 05/16/2015   Procedure: Peripheral Vascular Atherectomy;  Surgeon: Dorn JINNY Lesches, MD;  Location: [facility] INVASIVE CV LAB;  Service: Cardiovascular;  Laterality: Left;  sfa   FAMILY HISTORY Family History  Problem Relation Age of Onset   Hypertension Mother    SOCIAL HISTORY Social History   Tobacco Use   Smoking status: Never   Smokeless tobacco: Never  Vaping Use   Vaping status: Never Used  Substance Use Topics   Alcohol  use: Not Currently    Alcohol /week: 1.0 - 2.0 standard drink of alcohol     Types: 1 - 2 Cans of beer per week    Comment: occas   Drug use: No       OPHTHALMIC EXAM:  Not recorded    IMAGING AND PROCEDURES  Imaging and Procedures for 07/05/2024         ASSESSMENT/PLAN: No diagnosis found.  1-6. Proliferative diabetic retinopathy OD        Severe NPDR OS  - previously managed at [facility] -- referred here by [facility] due to insurance  - h/o IVE OS x4, last injection 12.11.24 w/ Dr. Tobie  - here s/p IVA OU #1 (02.05.25) #2(03.10.25), #3 (04.07.25), #4 (05.05.25)  - s/p IVE OU #1 (06.02.25) - initial exam showed +NVI w/ closed angles OD, IOP OD 40+ -- +neovascular glaucoma; OS with scattered MA/DBH, no NV - FA (02.05.25) OD: no images obtained (dense cataract); OS: no NV; scattered patches of vascular nonperfusion; scattered late leaking MA -- would likely benefit from PRP OS to areas of vascular nonperfusion - OCT shows OD: very grainy images, retina attached, diffuse IRA / ischemia, no  obvious IRF/SRF - no DME; OS: Mild IRF/edema temporal macula -- improved **discussed decreased efficacy / resistance to Avastin  and potential benefit of switching from Avastin  to Eylea ** - BCVA OD LP, OS 20/25 - IOP OD 41, OS 17 - recommend switching to IVE OU #2 today, 07.09.25 -- OD for NVI / neovascular glaucoma; OS for DME - pt wishes to proceed with injections - RBA of procedure discussed, questions answered - IVA informed consent obtained and signed, 02.05.25 (OU) - see procedure note - AC tap OD post-injection - cont Cosopt  and Brimonidine  TID OD for NVG; will add latanoprost  at bedtime OD - will refer to [facility] Glaucoma service for urgent NVG consult, possible CPC laser - f/u 4 wks, DFE, OCT, possible injection(s)  7,8. Hypertensive retinopathy OU - discussed importance of tight BP control  - monitor  9. Mixed Cataract  OD - The symptoms of cataract, surgical options, and treatments and risks were discussed with patient. - discussed diagnosis and progression - under the expert management of Dr. Octavia  10. Pseudophakia OS  - s/p CE/IOL OS (January 2025, Dr. Octavia)  - IOL in good position, doing well  - monitor  Ophthalmic Meds Ordered this visit:  No orders of the defined types were placed in this encounter.   No follow-ups on file.  There are no Patient Instructions on file for this visit.  This document serves as a record of services personally performed by Redell JUDITHANN Hans, MD, PhD. It was created on their behalf by Almetta Pesa, an ophthalmic technician. The creation of this record is the provider's dictation and/or activities during the visit.    Electronically signed by: Almetta Pesa, OA, 07/04/24  12:33 PM   Redell JUDITHANN Hans, M.D., Ph.D. Diseases & Surgery of the Retina and Vitreous [facility]    Abbreviations: M myopia (nearsighted); A astigmatism; H hyperopia (farsighted); P presbyopia; Mrx spectacle prescription;  CTL contact  lenses; OD right eye; OS left eye; OU both eyes  XT exotropia; ET esotropia; PEK punctate epithelial keratitis; PEE punctate epithelial erosions; DES dry eye syndrome; MGD meibomian gland dysfunction; ATs artificial tears; PFAT's preservative free artificial tears; NSC nuclear sclerotic cataract; PSC posterior subcapsular cataract; ERM epi-retinal membrane; PVD posterior vitreous detachment; RD retinal detachment; DM diabetes mellitus; DR diabetic retinopathy; NPDR non-proliferative diabetic retinopathy; PDR proliferative diabetic retinopathy; CSME clinically significant macular edema; DME diabetic macular edema; dbh dot blot hemorrhages; CWS cotton wool spot; POAG primary open angle glaucoma; C/D cup-to-disc ratio; HVF humphrey visual field; GVF goldmann visual field; OCT optical coherence tomography; IOP intraocular pressure; BRVO Branch retinal vein occlusion; CRVO central retinal vein occlusion; CRAO central retinal artery occlusion; BRAO branch retinal artery occlusion; RT retinal tear; SB scleral buckle; PPV pars plana vitrectomy; VH Vitreous hemorrhage; PRP panretinal laser photocoagulation; IVK intravitreal kenalog; VMT vitreomacular traction; MH Macular hole;  NVD neovascularization of the disc; NVE neovascularization elsewhere; AREDS age related eye disease study; ARMD age related macular degeneration; POAG primary open angle glaucoma; EBMD epithelial/anterior basement membrane dystrophy; ACIOL anterior chamber intraocular lens; IOL intraocular lens; PCIOL posterior chamber intraocular lens; Phaco/IOL phacoemulsification with intraocular lens placement; PRK photorefractive keratectomy; LASIK laser assisted in situ keratomileusis; HTN hypertension; DM diabetes mellitus; COPD chronic obstructive pulmonary disease

## 2024-07-05 ENCOUNTER — Encounter (INDEPENDENT_AMBULATORY_CARE_PROVIDER_SITE_OTHER): Admitting: Ophthalmology

## 2024-07-05 DIAGNOSIS — Z7985 Long-term (current) use of injectable non-insulin antidiabetic drugs: Secondary | ICD-10-CM

## 2024-07-05 DIAGNOSIS — H25811 Combined forms of age-related cataract, right eye: Secondary | ICD-10-CM

## 2024-07-05 DIAGNOSIS — Z794 Long term (current) use of insulin: Secondary | ICD-10-CM

## 2024-07-05 DIAGNOSIS — E113591 Type 2 diabetes mellitus with proliferative diabetic retinopathy without macular edema, right eye: Secondary | ICD-10-CM

## 2024-07-05 DIAGNOSIS — I10 Essential (primary) hypertension: Secondary | ICD-10-CM

## 2024-07-05 DIAGNOSIS — H4051X3 Glaucoma secondary to other eye disorders, right eye, severe stage: Secondary | ICD-10-CM

## 2024-07-05 DIAGNOSIS — Z961 Presence of intraocular lens: Secondary | ICD-10-CM

## 2024-07-05 DIAGNOSIS — H211X1 Other vascular disorders of iris and ciliary body, right eye: Secondary | ICD-10-CM

## 2024-07-05 DIAGNOSIS — H35033 Hypertensive retinopathy, bilateral: Secondary | ICD-10-CM

## 2024-07-05 DIAGNOSIS — E113412 Type 2 diabetes mellitus with severe nonproliferative diabetic retinopathy with macular edema, left eye: Secondary | ICD-10-CM

## 2024-07-05 NOTE — Progress Notes (Signed)
 [facility] - Clinic Note  07/10/2024   CHIEF COMPLAINT Patient presents for Retina Follow Up  HISTORY OF PRESENT ILLNESS: Samantha [name] is a 73 y.o. female who presents to the clinic today for:  HPI     Retina Follow Up   Patient presents with  Diabetic Retinopathy.  In both eyes.  This started 4 weeks ago.  Duration of 4 weeks.  Since onset it is stable.  I, the attending physician,  performed the HPI with the patient and updated documentation appropriately.        Comments   Retina follow up PDR OU pt is reporting no vision changes noticed she denies any flashes has some floaters she  last reading was 270 Saturday is using latanoprost  ou       Last edited by Valdemar Rogue, MD on 07/10/2024  8:47 PM.     Pt states no pain in right eye despite elevated IOP. Pt affirmed she saw [facility] for NVG with no intervention recommended.   Referring physician: Cloria Annabella CROME, DO [address],  [place] [address]  HISTORICAL INFORMATION:  Selected notes from the MEDICAL RECORD NUMBER Referred by [facility]: Dec 08, 2023 at [facility] Ocular Hx- previously managed by [facility] -- h/o anti-VEGF therapy OS x4, last injection was IVE OS on 12.11.24 PMH-   CURRENT MEDICATIONS: Current Outpatient Medications (Ophthalmic Drugs)  Medication Sig   brimonidine  (ALPHAGAN ) 0.2 % ophthalmic solution Place 1 drop into the right eye 2 (two) times daily.   ciprofloxacin  (CILOXAN ) 0.3 % ophthalmic solution Administer 2 drop into the right eye four times daily for  7 days   dorzolamide -timolol  (COSOPT ) 2-0.5 % ophthalmic solution Place 1 drop into the right eye 2 (two) times daily.   latanoprost  (XALATAN ) 0.005 % ophthalmic solution Place 1 drop into the right eye at bedtime.   ofloxacin (OCUFLOX) 0.3 % ophthalmic solution Place 1 drop into the left eye 4 (four) times daily.   No current facility-administered medications for this visit.  (Ophthalmic Drugs)   Current Outpatient Medications (Other)  Medication Sig   acetaminophen  (TYLENOL ) 325 MG tablet Take 1-2 tablets (325-650 mg total) by mouth every 4 (four) hours as needed for mild pain.   atorvastatin  (LIPITOR ) 80 MG tablet Take 1 tablet (80 mg total) by mouth daily. (Patient taking differently: Take 40 mg by mouth daily.)   carvedilol  (COREG ) 6.25 MG tablet Take 1 tablet (6.25 mg total) by mouth 2 (two) times daily with a meal.   clopidogrel  (PLAVIX ) 75 MG tablet TAKE 1 TABLET BY MOUTH EVERY DAY   insulin  glargine (LANTUS ) 100 UNIT/ML Solostar Pen Inject 15 Units into the skin daily.   lisinopril  (ZESTRIL ) 2.5 MG tablet Take 2.5 mg by mouth daily.   Multiple Vitamin (MULTIVITAMIN) capsule Take 1 capsule by mouth daily.   ondansetron  (ZOFRAN ) 4 MG tablet Take 1 tablet (4 mg total) by mouth every 6 (six) hours as needed for nausea. (Patient not taking: Reported on 09/15/2023)   oxyCODONE  (OXY IR/ROXICODONE ) 5 MG immediate release tablet Take 1 tablet (5 mg total) by mouth every 6 (six) hours as needed for severe pain.   Semaglutide (OZEMPIC, 0.25 OR 0.5 MG/DOSE, [place]) Inject 0.25 mg into the skin once a week.   sevelamer  carbonate (RENVELA ) 800 MG tablet TAKE 1 TABLET BY MOUTH 3 TIMES DAILY WITH MEALS.   sodium chloride  flush 0.9 % SOLN injection Flush cholecystectomy tube as directed by  general surgery   No current facility-administered medications for this visit. (Other)   REVIEW OF SYSTEMS: ROS   Positive for: Endocrine, Eyes Negative for: Constitutional, Gastrointestinal, Neurological, Skin, Genitourinary, Musculoskeletal, HENT, Cardiovascular, Respiratory, Psychiatric, Allergic/Imm, Heme/Lymph Last edited by Resa Delon ORN, COT on 07/10/2024  2:01 PM.      ALLERGIES Allergies  Allergen Reactions   Chicken Allergy Other (See Comments)    headache   Perflutren  Other (See Comments)    Pt with back pain as soon as administered.- patient refuted this in 2024    PAST MEDICAL HISTORY Past Medical History:  Diagnosis Date   Acute pyelonephritis    Bacteremia, escherichia coli 01/12/2015   Chest pain    Critical lower limb ischemia (HCC)    Hyperlipidemia    Hypertension    Left carotid bruit    PAD (peripheral artery disease) (HCC)    Type II diabetes mellitus (HCC)    Past Surgical History:  Procedure Laterality Date   ABDOMINAL ANGIOGRAM  05/16/2015   Procedure: Abdominal Angiogram;  Surgeon: Dorn JINNY Lesches, MD;  Location: [facility];  Service: Cardiovascular;;   ABDOMINAL HYSTERECTOMY  ~ 2000   CHOLECYSTECTOMY N/A 09/23/2023   Procedure: LAPAROSCOPIC CHOLECYSTECTOMY;  Surgeon: Kinsinger, Herlene Righter, MD;  Location: [facility] OR;  Service: General;  Laterality: N/A;   IR EXCHANGE BILIARY DRAIN  07/26/2023   IR FLUORO GUIDE CV LINE RIGHT  05/17/2023   IR PERC CHOLECYSTOSTOMY  05/23/2023   IR US  GUIDE VASC ACCESS RIGHT  05/17/2023   PERIPHERAL VASCULAR CATHETERIZATION Bilateral 05/16/2015   Procedure: Lower Extremity Angiography;  Surgeon: Dorn JINNY Lesches, MD; renal arteries widely patent, L-SFA 75%, L-pop 95%, L-peroneal 90%; R-SFA 40%, R-pop 50%, R-prox peroneal 90%; directional atherectomy L-SFA, p-pop, drug-eluting balloon angioplasty reducing the stenoses to 0, small linear dissection was not flow limiting       PERIPHERAL VASCULAR CATHETERIZATION Left 05/16/2015   Procedure: Peripheral Vascular Atherectomy;  Surgeon: Dorn JINNY Lesches, MD;  Location: [facility];  Service: Cardiovascular;  Laterality: Left;  sfa   FAMILY HISTORY Family History  Problem Relation Age of Onset   Hypertension Mother    SOCIAL HISTORY Social History   Tobacco Use   Smoking status: Never   Smokeless tobacco: Never  Vaping Use   Vaping status: Never Used  Substance Use Topics   Alcohol  use: Not Currently    Alcohol /week: 1.0 - 2.0 standard drink of alcohol     Types: 1 - 2 Cans of beer per week    Comment: occas   Drug use: No        OPHTHALMIC EXAM:  Base Eye Exam     Visual Acuity (Snellen - Linear)       Right Left   Dist [place] LP 20/30 -2         Tonometry (Tonopen, 2:06 PM)       Right Left   Pressure 54 16  Brim/dorz OD         Pupils       Pupils Dark Light Shape React APD   Right PERRL 5 5 Round NR APD   Left PERRL 3 2 Round Brisk None         Visual Fields       Left Right   Restrictions  Total superior temporal, inferior temporal, superior nasal, inferior nasal deficiencies         Neuro/Psych     Oriented x3: Yes   Mood/Affect: Normal  Dilation     Both eyes: 2.5% Phenylephrine @ 2:06 PM           Slit Lamp and Fundus Exam     Slit Lamp Exam       Right Left   Lids/Lashes Dermatochalasis - upper lid Dermatochalasis - upper lid   Conjunctiva/Sclera nasal and temporal pinguecula nasal and temporal pinguecula, mild melanosis, Trace Injection   Cornea trace PEE, mild tear film debris well healed cataract wound, mild tear film debris   Anterior Chamber deep, closed angles deep and clear   Iris Round and dilated, +NVI Round and dilated, No NVI   Lens 3-4+ Nuclear sclerosis with brunescence, 3-4+ Cortical cataract, 2+ Posterior subcapsular cataract PC IOL in good position, trace posterior capsular opacification   Anterior Vitreous Vitreous syneresis Vitreous syneresis, PVD, Vitreous condensation         Fundus Exam       Right Left   Disc hazy view, 3-4+Pallor, severely attenuated vessels, +cupping 2+ Pallor, Sharp rim, mild PPA   C/D Ratio 0.9 0.3   Macula hazy view, grossly flat, diffuse atrophy, no heme Flat, Good foveal reflex, scattered MA greatest temporal, +cystic changes -- sightly improved   Vessels Severe attenuation attenuated, Tortuous, focal sclerosis temporal macula   Periphery very hazy view, attached, Attached, rare MA           IMAGING AND PROCEDURES  Imaging and Procedures for 07/10/2024  OCT, Retina - OU - Both Eyes       Right  Eye Progression has no prior data. Findings include normal foveal contour, no IRF, no SRF, epiretinal membrane, outer retinal atrophy (No interpretable images obtained today).   Left Eye Quality was good. Central Foveal Thickness: 261. Progression has improved. Findings include normal foveal contour, no SRF, intraretinal hyper-reflective material, intraretinal fluid (Persistent Mild IRF/edema temporal macula --slightly improved).   Notes *Images captured and stored on drive  Diagnosis / Impression:  OD: No interpretable images obtained today OS: Persistent Mild IRF/edema temporal macula --slightly improved  Clinical management:  See below  Abbreviations: NFP - Normal foveal profile. CME - cystoid macular edema. PED - pigment epithelial detachment. IRF - intraretinal fluid. SRF - subretinal fluid. EZ - ellipsoid zone. ERM - epiretinal membrane. ORA - outer retinal atrophy. ORT - outer retinal tubulation. SRHM - subretinal hyper-reflective material. IRHM - intraretinal hyper-reflective material      Intravitreal Injection, Pharmacologic Agent - OS - Left Eye       Time Out 07/10/2024. 3:10 PM. Confirmed correct patient, procedure, site, and patient consented.   Anesthesia Topical anesthesia was used. Anesthetic medications included Lidocaine  2%, Proparacaine 0.5%.   Procedure Preparation included 5% betadine to ocular surface, eyelid speculum. A (32g) needle was used.   Injection: 2 mg aflibercept  2 MG/0.05ML   Route: Intravitreal, Site: Left Eye   NDC: Q956576, Lot: [id number], Expiration date: 10/26/2025, Waste: 0 mL   Post-op Post injection exam found visual acuity of at least counting fingers. The patient tolerated the procedure well. There were no complications. The patient received written and verbal post procedure care education.            ASSESSMENT/PLAN:   ICD-10-CM   1. 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 Neovascular glaucoma of right eye, severe stage  H40.51X3     4. 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 Current use of insulin  (HCC)  Z79.4     6. Long-term (current) use of injectable non-insulin  antidiabetic drugs  Z79.85     7. Hypertensive retinopathy of both eyes  H35.033     8. Essential hypertension  I10     9. Combined forms of age-related cataract of right eye  H25.811     10. Pseudophakia  Z96.1      1-6. Proliferative diabetic retinopathy OD        Severe NPDR OS  - previously managed at [facility] -- referred here by [facility] due to insurance  - h/o IVE OS x4, last injection 12.11.24 w/ Dr. Tobie  - here s/p IVA OU #1 (02.05.25) #2(03.10.25), #3 (04.07.25), #4 (05.05.25) -- IVA resistance  - s/p IVE OU #1 (06.02.25) - initial exam showed +NVI w/ closed angles OD, IOP OD 40+ -- +neovascular glaucoma; OS with scattered MA/DBH, no NV - FA (02.05.25) OD: no images obtained (dense cataract); OS: no NV; scattered patches of vascular nonperfusion; scattered late leaking MA -- would likely benefit from PRP OS to areas of vascular nonperfusion - OCT shows OD: No interpretable images obtained today; OS: Persistent mild IRF/edema temporal macula -- slightly improved - BCVA OD LP, OS 20/25 - IOP OD 56, OS 16 - pt was seen at [facility] for NVG on 6.3.25 by Dr. Atalie Thompson -- no intervention was recommended, comfort care only, follow up PRN - recommend IVE OS #2 today, 07.14.25 for DME, will hold tx OD - pt wishes to proceed with injection OS - RBA of procedure discussed, questions answered - IVE informed consent obtained and signed, 06.02.25  (OU) - IVA informed consent obtained and signed, 02.05.25 (OU) - cont Cosopt  and Brimonidine  TID OD, latanoprost  at bedtime OD for NVG - f/u 4 wks, DFE, OCT, possible injection(s)  7,8. Hypertensive retinopathy OU - discussed importance of tight BP control  - monitor  9. Mixed Cataract OD - The symptoms of cataract, surgical options, and treatments and risks were discussed with patient. - discussed diagnosis and progression - under the expert management of Dr. Octavia  10. Pseudophakia OS  - s/p CE/IOL OS (January 2025, Dr. Octavia)  - IOL in good position, doing well  - monitor  Ophthalmic Meds Ordered this visit:  Meds ordered this encounter  Medications   aflibercept  (EYLEA ) SOLN 2 mg    Return in about 4 weeks (around 08/07/2024) for PDR OU, DFE, OCT, Possible Injxn.  There are no Patient Instructions on file for this visit.  This document serves as a record of services personally performed by Redell JUDITHANN Hans, MD, PhD. It was created on their behalf by Almetta Pesa, an ophthalmic technician. The creation of this record is the provider's dictation and/or activities during the visit.    Electronically signed by: Almetta Pesa, OA, 07/10/24  8:53 PM  Redell JUDITHANN Hans, M.D., Ph.D. Diseases & Surgery of the Retina and Vitreous [facility]  I have reviewed the above documentation for accuracy and completeness, and I agree with the above. Redell JUDITHANN Hans, M.D., Ph.D. 07/10/24 8:58 PM   Abbreviations: M myopia (nearsighted); A astigmatism; H hyperopia (farsighted);  P presbyopia; Mrx spectacle prescription;  CTL contact lenses; OD right eye; OS left eye; OU both eyes  XT exotropia; ET esotropia; PEK punctate epithelial keratitis; PEE punctate epithelial erosions; DES dry eye syndrome; MGD meibomian gland dysfunction; ATs artificial tears; PFAT's preservative free artificial tears; NSC nuclear sclerotic cataract; PSC posterior subcapsular cataract; ERM  epi-retinal membrane; PVD posterior vitreous detachment; RD retinal detachment; DM diabetes mellitus; DR diabetic retinopathy; NPDR non-proliferative diabetic retinopathy; PDR proliferative diabetic retinopathy; CSME clinically significant macular edema; DME diabetic macular edema; dbh dot blot hemorrhages; CWS cotton wool spot; POAG primary open angle glaucoma; C/D cup-to-disc ratio; HVF humphrey visual field; GVF goldmann visual field; OCT optical coherence tomography; IOP intraocular pressure; BRVO Branch retinal vein occlusion; CRVO central retinal vein occlusion; CRAO central retinal artery occlusion; BRAO branch retinal artery occlusion; RT retinal tear; SB scleral buckle; PPV pars plana vitrectomy; VH Vitreous hemorrhage; PRP panretinal laser photocoagulation; IVK intravitreal kenalog; VMT vitreomacular traction; MH Macular hole;  NVD neovascularization of the disc; NVE neovascularization elsewhere; AREDS age related eye disease study; ARMD age related macular degeneration; POAG primary open angle glaucoma; EBMD epithelial/anterior basement membrane dystrophy; ACIOL anterior chamber intraocular lens; IOL intraocular lens; PCIOL posterior chamber intraocular lens; Phaco/IOL phacoemulsification with intraocular lens placement; PRK photorefractive keratectomy; LASIK laser assisted in situ keratomileusis; HTN hypertension; DM diabetes mellitus; COPD chronic obstructive pulmonary disease

## 2024-07-10 ENCOUNTER — Encounter (INDEPENDENT_AMBULATORY_CARE_PROVIDER_SITE_OTHER): Payer: Self-pay | Admitting: Ophthalmology

## 2024-07-10 ENCOUNTER — Ambulatory Visit (INDEPENDENT_AMBULATORY_CARE_PROVIDER_SITE_OTHER): Admitting: Ophthalmology

## 2024-07-10 DIAGNOSIS — H211X1 Other vascular disorders of iris and ciliary body, right eye: Secondary | ICD-10-CM

## 2024-07-10 DIAGNOSIS — H25811 Combined forms of age-related cataract, right eye: Secondary | ICD-10-CM

## 2024-07-10 DIAGNOSIS — Z7985 Long-term (current) use of injectable non-insulin antidiabetic drugs: Secondary | ICD-10-CM

## 2024-07-10 DIAGNOSIS — E113412 Type 2 diabetes mellitus with severe nonproliferative diabetic retinopathy with macular edema, left eye: Secondary | ICD-10-CM

## 2024-07-10 DIAGNOSIS — E113591 Type 2 diabetes mellitus with proliferative diabetic retinopathy without macular edema, right eye: Secondary | ICD-10-CM | POA: Diagnosis not present

## 2024-07-10 DIAGNOSIS — Z794 Long term (current) use of insulin: Secondary | ICD-10-CM

## 2024-07-10 DIAGNOSIS — Z961 Presence of intraocular lens: Secondary | ICD-10-CM

## 2024-07-10 DIAGNOSIS — H35033 Hypertensive retinopathy, bilateral: Secondary | ICD-10-CM

## 2024-07-10 DIAGNOSIS — I10 Essential (primary) hypertension: Secondary | ICD-10-CM

## 2024-07-10 DIAGNOSIS — H4051X3 Glaucoma secondary to other eye disorders, right eye, severe stage: Secondary | ICD-10-CM

## 2024-07-10 MED ORDER — AFLIBERCEPT 2MG/0.05ML IZ SOLN FOR KALEIDOSCOPE
2.0000 mg | INTRAVITREAL | Status: AC | PRN
  Administered 2024-07-10: 2 mg via INTRAVITREAL

## 2024-07-20 ENCOUNTER — Other Ambulatory Visit: Payer: Self-pay

## 2024-07-20 DIAGNOSIS — Z992 Dependence on renal dialysis: Secondary | ICD-10-CM

## 2024-07-20 DIAGNOSIS — Q2543 Congenital aneurysm of aorta: Secondary | ICD-10-CM

## 2024-07-20 DIAGNOSIS — I679 Cerebrovascular disease, unspecified: Secondary | ICD-10-CM

## 2024-07-20 DIAGNOSIS — I7121 Aneurysm of the ascending aorta, without rupture: Secondary | ICD-10-CM

## 2024-07-20 DIAGNOSIS — I132 Hypertensive heart and chronic kidney disease with heart failure and with stage 5 chronic kidney disease, or end stage renal disease: Secondary | ICD-10-CM

## 2024-07-24 NOTE — Progress Notes (Shared)
 [facility] - Clinic Note  08/07/2024   CHIEF COMPLAINT Patient presents for No chief complaint on file.  HISTORY OF PRESENT ILLNESS: Samantha [name] is a 73 y.o. female who presents to the clinic today for:    Pt states no pain in right eye despite elevated IOP. Pt affirmed she saw [facility] for NVG with no intervention recommended.   Referring physician: Cloria Annabella CROME, DO [address] [place],  [place] [address]  HISTORICAL INFORMATION:  Selected notes from the MEDICAL RECORD NUMBER Referred by [facility]: Dec 08, 2023 at [facility] Ocular Hx- previously managed by [facility] -- h/o anti-VEGF therapy OS x4, last injection was IVE OS on 12.11.24 PMH-   CURRENT MEDICATIONS: Current Outpatient Medications (Ophthalmic Drugs)  Medication Sig   brimonidine  (ALPHAGAN ) 0.2 % ophthalmic solution Place 1 drop into the right eye 2 (two) times daily.   ciprofloxacin  (CILOXAN ) 0.3 % ophthalmic solution Administer 2 drop into the right eye four times daily for  7 days   dorzolamide -timolol  (COSOPT ) 2-0.5 % ophthalmic solution Place 1 drop into the right eye 2 (two) times daily.   latanoprost  (XALATAN ) 0.005 % ophthalmic solution Place 1 drop into the right eye at bedtime.   ofloxacin (OCUFLOX) 0.3 % ophthalmic solution Place 1 drop into the left eye 4 (four) times daily.   No current facility-administered medications for this visit. (Ophthalmic Drugs)   Current Outpatient Medications (Other)  Medication Sig   acetaminophen  (TYLENOL ) 325 MG tablet Take 1-2 tablets (325-650 mg total) by mouth every 4 (four) hours as needed for mild pain.   atorvastatin  (LIPITOR ) 80 MG tablet Take 1 tablet (80 mg total) by mouth daily. (Patient taking differently: Take 40 mg by mouth daily.)   carvedilol  (COREG ) 6.25 MG tablet Take 1 tablet (6.25 mg total) by mouth 2 (two) times daily with a meal.   clopidogrel  (PLAVIX ) 75 MG tablet TAKE 1 TABLET BY MOUTH  EVERY DAY   insulin  glargine (LANTUS ) 100 UNIT/ML Solostar Pen Inject 15 Units into the skin daily.   lisinopril  (ZESTRIL ) 2.5 MG tablet Take 2.5 mg by mouth daily.   Multiple Vitamin (MULTIVITAMIN) capsule Take 1 capsule by mouth daily.   ondansetron  (ZOFRAN ) 4 MG tablet Take 1 tablet (4 mg total) by mouth every 6 (six) hours as needed for nausea. (Patient not taking: Reported on 09/15/2023)   oxyCODONE  (OXY IR/ROXICODONE ) 5 MG immediate release tablet Take 1 tablet (5 mg total) by mouth every 6 (six) hours as needed for severe pain.   Semaglutide (OZEMPIC, 0.25 OR 0.5 MG/DOSE, [place]) Inject 0.25 mg into the skin once a week.   sevelamer  carbonate (RENVELA ) 800 MG tablet TAKE 1 TABLET BY MOUTH 3 TIMES DAILY WITH MEALS.   sodium chloride  flush 0.9 % SOLN injection Flush cholecystectomy tube as directed by general surgery   No current facility-administered medications for this visit. (Other)   REVIEW OF SYSTEMS:    ALLERGIES Allergies  Allergen Reactions   Chicken Allergy Other (See Comments)    headache   Perflutren  Other (See Comments)    Pt with back pain as soon as administered.- patient refuted this in 2024   PAST MEDICAL HISTORY Past Medical History:  Diagnosis Date   Acute pyelonephritis    Bacteremia, escherichia coli 01/12/2015   Chest pain    Critical lower limb ischemia (HCC)    Hyperlipidemia    Hypertension    Left carotid bruit    PAD (  peripheral artery disease) (HCC)    Type II diabetes mellitus (HCC)    Past Surgical History:  Procedure Laterality Date   ABDOMINAL ANGIOGRAM  05/16/2015   Procedure: Abdominal Angiogram;  Surgeon: Dorn JINNY Lesches, MD;  Location: [facility] INVASIVE CV LAB;  Service: Cardiovascular;;   ABDOMINAL HYSTERECTOMY  ~ 2000   CHOLECYSTECTOMY N/A 09/23/2023   Procedure: LAPAROSCOPIC CHOLECYSTECTOMY;  Surgeon: Kinsinger, Herlene Righter, MD;  Location: [facility] OR;  Service: General;  Laterality: N/A;   IR EXCHANGE BILIARY DRAIN  07/26/2023   IR FLUORO GUIDE CV  LINE RIGHT  05/17/2023   IR PERC CHOLECYSTOSTOMY  05/23/2023   IR US  GUIDE VASC ACCESS RIGHT  05/17/2023   PERIPHERAL VASCULAR CATHETERIZATION Bilateral 05/16/2015   Procedure: Lower Extremity Angiography;  Surgeon: Dorn JINNY Lesches, MD; renal arteries widely patent, L-SFA 75%, L-pop 95%, L-peroneal 90%; R-SFA 40%, R-pop 50%, R-prox peroneal 90%; directional atherectomy L-SFA, p-pop, drug-eluting balloon angioplasty reducing the stenoses to 0, small linear dissection was not flow limiting       PERIPHERAL VASCULAR CATHETERIZATION Left 05/16/2015   Procedure: Peripheral Vascular Atherectomy;  Surgeon: Dorn JINNY Lesches, MD;  Location: [facility] INVASIVE CV LAB;  Service: Cardiovascular;  Laterality: Left;  sfa   FAMILY HISTORY Family History  Problem Relation Age of Onset   Hypertension Mother    SOCIAL HISTORY Social History   Tobacco Use   Smoking status: Never   Smokeless tobacco: Never  Vaping Use   Vaping status: Never Used  Substance Use Topics   Alcohol  use: Not Currently    Alcohol /week: 1.0 - 2.0 standard drink of alcohol     Types: 1 - 2 Cans of beer per week    Comment: occas   Drug use: No       OPHTHALMIC EXAM:  Not recorded    IMAGING AND PROCEDURES  Imaging and Procedures for 08/07/2024          ASSESSMENT/PLAN:   ICD-10-CM   1. 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 Neovascular glaucoma of right eye, severe stage  H40.51X3     4. 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 Current use of insulin  (HCC)  Z79.4     6. Long-term (current) use of injectable non-insulin  antidiabetic drugs  Z79.85     7. Hypertensive retinopathy of both eyes  H35.033     8. Essential hypertension  I10     9. Combined forms of age-related cataract  of right eye  H25.811     10. Pseudophakia  Z96.1       1-6. Proliferative diabetic retinopathy OD        Severe NPDR OS  - previously managed at [facility] -- referred here by [facility] due to insurance  - s/p IVE OS x4, last injection 12.11.24 w/ Dr. Tobie, #2 (07.14.25)  - here s/p IVA OU #1 (02.05.25) #2(03.10.25), #3 (04.07.25), #4 (05.05.25) -- IVA resistance  - s/p IVE OU #1 (06.02.25) - initial exam showed +NVI w/ closed angles OD, IOP OD 40+ -- +neovascular glaucoma; OS with scattered MA/DBH, no NV - FA (02.05.25) OD: no images obtained (dense cataract); OS: no NV; scattered patches of vascular nonperfusion; scattered late leaking MA -- would likely benefit from  PRP OS to areas of vascular nonperfusion - OCT shows OD: No interpretable images obtained today; OS: Persistent mild IRF/edema temporal macula -- slightly improved - BCVA OD LP, OS 20/25 - IOP OD 56, OS 16 - pt was seen at [facility] for NVG on 6.3.25 by Dr. Atalie Thompson -- no intervention was recommended, comfort care only, follow up PRN - recommend IVE OS #3 today, 08.11.25 for DME, will hold tx OD - pt wishes to proceed with injection OS - RBA of procedure discussed, questions answered - IVE informed consent obtained and signed, 06.02.25 (OU) - IVA informed consent obtained and signed, 02.05.25 (OU) - cont Cosopt  and Brimonidine  TID OD, latanoprost  at bedtime OD for NVG - f/u 4 wks, DFE, OCT, possible injection(s)  7,8. Hypertensive retinopathy OU - discussed importance of tight BP control  - monitor  9. Mixed Cataract OD - The symptoms of cataract, surgical options, and treatments and risks were discussed with patient. - discussed diagnosis and progression - under the expert management of Dr. Octavia  10. Pseudophakia OS  - s/p CE/IOL OS (January 2025, Dr. Octavia)  - IOL in good position, doing well  - monitor  Ophthalmic Meds Ordered this visit:  No orders of the defined types were placed  in this encounter.   No follow-ups on file.  There are no Patient Instructions on file for this visit.  This document serves as a record of services personally performed by Redell JUDITHANN Hans, MD, PhD. It was created on their behalf by Avelina Pereyra, COA an ophthalmic technician. The creation of this record is the provider's dictation and/or activities during the visit.   Electronically signed by: Avelina GORMAN Pereyra, COT  07/24/24  10:28 AM   Redell JUDITHANN Hans, M.D., Ph.D. Diseases & Surgery of the Retina and Vitreous [facility]    Abbreviations: M myopia (nearsighted); A astigmatism; H hyperopia (farsighted); P presbyopia; Mrx spectacle prescription;  CTL contact lenses; OD right eye; OS left eye; OU both eyes  XT exotropia; ET esotropia; PEK punctate epithelial keratitis; PEE punctate epithelial erosions; DES dry eye syndrome; MGD meibomian gland dysfunction; ATs artificial tears; PFAT's preservative free artificial tears; NSC nuclear sclerotic cataract; PSC posterior subcapsular cataract; ERM epi-retinal membrane; PVD posterior vitreous detachment; RD retinal detachment; DM diabetes mellitus; DR diabetic retinopathy; NPDR non-proliferative diabetic retinopathy; PDR proliferative diabetic retinopathy; CSME clinically significant macular edema; DME diabetic macular edema; dbh dot blot hemorrhages; CWS cotton wool spot; POAG primary open angle glaucoma; C/D cup-to-disc ratio; HVF humphrey visual field; GVF goldmann visual field; OCT optical coherence tomography; IOP intraocular pressure; BRVO Branch retinal vein occlusion; CRVO central retinal vein occlusion; CRAO central retinal artery occlusion; BRAO branch retinal artery occlusion; RT retinal tear; SB scleral buckle; PPV pars plana vitrectomy; VH Vitreous hemorrhage; PRP panretinal laser photocoagulation; IVK intravitreal kenalog; VMT vitreomacular traction; MH Macular hole;  NVD neovascularization of the disc; NVE  neovascularization elsewhere; AREDS age related eye disease study; ARMD age related macular degeneration; POAG primary open angle glaucoma; EBMD epithelial/anterior basement membrane dystrophy; ACIOL anterior chamber intraocular lens; IOL intraocular lens; PCIOL posterior chamber intraocular lens; Phaco/IOL phacoemulsification with intraocular lens placement; PRK photorefractive keratectomy; LASIK laser assisted in situ keratomileusis; HTN hypertension; DM diabetes mellitus; COPD chronic obstructive pulmonary disease

## 2024-07-28 ENCOUNTER — Other Ambulatory Visit

## 2024-07-31 ENCOUNTER — Other Ambulatory Visit: Payer: Self-pay | Admitting: *Deleted

## 2024-07-31 DIAGNOSIS — R0989 Other specified symptoms and signs involving the circulatory and respiratory systems: Secondary | ICD-10-CM

## 2024-08-02 NOTE — Telephone Encounter (Signed)
 Referral sent to GI per Dr. Tomy orders due to n/v and Type 1 DM.

## 2024-08-07 ENCOUNTER — Encounter (INDEPENDENT_AMBULATORY_CARE_PROVIDER_SITE_OTHER): Admitting: Ophthalmology

## 2024-08-07 DIAGNOSIS — Z961 Presence of intraocular lens: Secondary | ICD-10-CM

## 2024-08-07 DIAGNOSIS — H211X1 Other vascular disorders of iris and ciliary body, right eye: Secondary | ICD-10-CM

## 2024-08-07 DIAGNOSIS — H4051X3 Glaucoma secondary to other eye disorders, right eye, severe stage: Secondary | ICD-10-CM

## 2024-08-07 DIAGNOSIS — Z794 Long term (current) use of insulin: Secondary | ICD-10-CM

## 2024-08-07 DIAGNOSIS — E113412 Type 2 diabetes mellitus with severe nonproliferative diabetic retinopathy with macular edema, left eye: Secondary | ICD-10-CM

## 2024-08-07 DIAGNOSIS — I10 Essential (primary) hypertension: Secondary | ICD-10-CM

## 2024-08-07 DIAGNOSIS — H25811 Combined forms of age-related cataract, right eye: Secondary | ICD-10-CM

## 2024-08-07 DIAGNOSIS — Z7985 Long-term (current) use of injectable non-insulin antidiabetic drugs: Secondary | ICD-10-CM

## 2024-08-07 DIAGNOSIS — E113591 Type 2 diabetes mellitus with proliferative diabetic retinopathy without macular edema, right eye: Secondary | ICD-10-CM

## 2024-08-07 DIAGNOSIS — H35033 Hypertensive retinopathy, bilateral: Secondary | ICD-10-CM

## 2024-08-15 ENCOUNTER — Other Ambulatory Visit: Payer: Self-pay

## 2024-08-15 ENCOUNTER — Inpatient Hospital Stay: Admission: RE | Admit: 2024-08-15 | Source: Ambulatory Visit

## 2024-08-23 ENCOUNTER — Ambulatory Visit
Admission: RE | Admit: 2024-08-23 | Discharge: 2024-08-23 | Disposition: A | Discharge status: Home / Self Care | Source: Ambulatory Visit

## 2024-08-23 DIAGNOSIS — Q2543 Congenital aneurysm of aorta: Secondary | ICD-10-CM

## 2024-08-23 DIAGNOSIS — I679 Cerebrovascular disease, unspecified: Secondary | ICD-10-CM

## 2024-08-23 DIAGNOSIS — I132 Hypertensive heart and chronic kidney disease with heart failure and with stage 5 chronic kidney disease, or end stage renal disease: Secondary | ICD-10-CM

## 2024-08-23 DIAGNOSIS — Z992 Dependence on renal dialysis: Secondary | ICD-10-CM

## 2024-08-23 MED ORDER — IOPAMIDOL (ISOVUE-370) INJECTION 76%
75.0000 mL | Freq: Once | INTRAVENOUS | Status: AC | PRN
  Administered 2024-08-23: 75 mL via INTRAVENOUS

## 2024-08-25 ENCOUNTER — Ambulatory Visit (HOSPITAL_COMMUNITY)
Admission: RE | Admit: 2024-08-25 | Discharge: 2024-08-25 | Disposition: A | Discharge status: Home / Self Care | Source: Ambulatory Visit | Attending: Vascular Surgery | Admitting: Vascular Surgery

## 2024-08-25 ENCOUNTER — Encounter: Payer: Self-pay | Admitting: Vascular Surgery

## 2024-08-25 ENCOUNTER — Ambulatory Visit: Attending: Vascular Surgery | Admitting: Vascular Surgery

## 2024-08-25 VITALS — BP 159/75 | HR 76 | Temp 97.9°F | Ht 65.0 in | Wt 138.0 lb

## 2024-08-25 DIAGNOSIS — R0989 Other specified symptoms and signs involving the circulatory and respiratory systems: Secondary | ICD-10-CM | POA: Diagnosis present

## 2024-08-25 DIAGNOSIS — I6522 Occlusion and stenosis of left carotid artery: Secondary | ICD-10-CM | POA: Diagnosis not present

## 2024-08-25 NOTE — Progress Notes (Signed)
 Patient ID: Samantha [name], female   DOB: [birth year], 73 y.o.   MRN: [id number]  Reason for Consult: New Patient (Initial Visit)   Referred by Houben, Georgann G, FNP  Subjective:     HPI Samantha [name] is a 73 y.o. female presenting for evaluation of carotid artery disease.  She was sent by her PCP for evaluation due to a bruit.  She does have history of a CVA in the chart but denies any knowledge of this and reports it was during a prolonged hospitalization and she does not remember.  She denies any residual deficits from this stroke.  She denies any recent symptoms of one-sided weakness, numbness, amaurosis or speech issues.  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  Past Medical History:  Diagnosis Date   Acute pyelonephritis    Bacteremia, escherichia coli 01/12/2015   Chest pain    Critical lower limb ischemia (HCC)    Hyperlipidemia    Hypertension    Left carotid bruit    PAD (peripheral artery disease) (HCC)    Type II diabetes mellitus (HCC)    Family History  Problem Relation Age of Onset   Hypertension Mother    Past Surgical History:  Procedure Laterality Date   ABDOMINAL ANGIOGRAM  05/16/2015   Procedure: Abdominal Angiogram;  Surgeon: Dorn JINNY Lesches, MD;  Location: [facility] INVASIVE CV LAB;  Service: Cardiovascular;;   ABDOMINAL HYSTERECTOMY  ~ 2000   CHOLECYSTECTOMY N/A 09/23/2023   Procedure: LAPAROSCOPIC CHOLECYSTECTOMY;  Surgeon: Stevie Herlene Righter, MD;  Location: [facility] OR;  Service: General;  Laterality: N/A;   IR EXCHANGE BILIARY DRAIN  07/26/2023   IR FLUORO GUIDE CV LINE RIGHT  05/17/2023   IR PERC CHOLECYSTOSTOMY  05/23/2023   IR US  GUIDE VASC ACCESS RIGHT  05/17/2023   PERIPHERAL VASCULAR CATHETERIZATION Bilateral 05/16/2015   Procedure: Lower Extremity Angiography;  Surgeon: Dorn JINNY Lesches, MD; renal arteries widely patent, L-SFA 75%, L-pop 95%, L-peroneal 90%; R-SFA 40%, R-pop 50%, R-prox peroneal 90%; directional atherectomy  L-SFA, p-pop, drug-eluting balloon angioplasty reducing the stenoses to 0, small linear dissection was not flow limiting       PERIPHERAL VASCULAR CATHETERIZATION Left 05/16/2015   Procedure: Peripheral Vascular Atherectomy;  Surgeon: Dorn JINNY Lesches, MD;  Location: [facility] INVASIVE CV LAB;  Service: Cardiovascular;  Laterality: Left;  sfa    Short Social History:  Social History   Tobacco Use   Smoking status: Never   Smokeless tobacco: Never  Substance Use Topics   Alcohol  use: Not Currently    Alcohol /week: 1.0 - 2.0 standard drink of alcohol     Types: 1 - 2 Cans of beer per week    Comment: occas    Allergies  Allergen Reactions   Chicken Allergy Other (See Comments)    headache   Perflutren  Other (See Comments)    Pt with back pain as soon as administered.- patient refuted this in 2024    Current Outpatient Medications  Medication Sig Dispense Refill   acetaminophen  (TYLENOL ) 325 MG tablet Take 1-2 tablets (325-650 mg total) by mouth every 4 (four) hours as needed for mild pain.     atorvastatin  (LIPITOR ) 80 MG tablet Take 1 tablet (80 mg total) by mouth daily. (Patient taking differently: Take 40 mg by mouth daily.) 90 tablet 1   brimonidine  (ALPHAGAN ) 0.2 % ophthalmic solution Place 1 drop into the right eye 2 (two) times daily. 10 mL 3   carvedilol  (COREG ) 6.25 MG tablet Take 1  tablet (6.25 mg total) by mouth 2 (two) times daily with a meal. 180 tablet 2   ciprofloxacin  (CILOXAN ) 0.3 % ophthalmic solution Administer 2 drop into the right eye four times daily for  7 days 10 mL 0   clopidogrel  (PLAVIX ) 75 MG tablet TAKE 1 TABLET BY MOUTH EVERY DAY 90 tablet 1   dorzolamide -timolol  (COSOPT ) 2-0.5 % ophthalmic solution Place 1 drop into the right eye 2 (two) times daily. 10 mL 5   insulin  glargine (LANTUS ) 100 UNIT/ML Solostar Pen Inject 15 Units into the skin daily. 15 mL 3   latanoprost  (XALATAN ) 0.005 % ophthalmic solution Place 1 drop into the right eye at bedtime. 7.5 mL 5    lisinopril  (ZESTRIL ) 2.5 MG tablet Take 2.5 mg by mouth daily.     Multiple Vitamin (MULTIVITAMIN) capsule Take 1 capsule by mouth daily.     ofloxacin (OCUFLOX) 0.3 % ophthalmic solution Place 1 drop into the left eye 4 (four) times daily.     ondansetron  (ZOFRAN ) 4 MG tablet Take 1 tablet (4 mg total) by mouth every 6 (six) hours as needed for nausea. 15 tablet 0   oxyCODONE  (OXY IR/ROXICODONE ) 5 MG immediate release tablet Take 1 tablet (5 mg total) by mouth every 6 (six) hours as needed for severe pain. 15 tablet 0   Semaglutide (OZEMPIC, 0.25 OR 0.5 MG/DOSE, Samantha [name]) Inject 0.25 mg into the skin once a week.     sevelamer  carbonate (RENVELA ) 800 MG tablet TAKE 1 TABLET BY MOUTH 3 TIMES DAILY WITH MEALS. 270 tablet 1   sodium chloride  flush 0.9 % SOLN injection Flush cholecystectomy tube as directed by general surgery 10 mL 10   No current facility-administered medications for this visit.    REVIEW OF SYSTEMS  All other systems were reviewed and are negative     Objective:  Objective   Vitals:   08/25/24 1438  BP: (!) 159/75  Pulse: 76  Temp: 97.9 F (36.6 C)  SpO2: 96%  Weight: 138 lb (62.6 kg)  Height: 5' 5 (1.651 m)   Body mass index is 22.96 kg/m.  Physical Exam General: no acute distress Cardiac: hemodynamically stable Pulm: normal work of breathing Neuro: alert, no focal deficit Extremities: no edema, cyanosis or wounds Vascular:   Right: Palpable radial  Left: Palpable radial, palpable thrill in left arm brachiocephalic fistula  Data: Carotid duplex Right Carotid Findings:  +----------+--------+--------+--------+-------------------+--------+           PSV cm/sEDV cm/sStenosisPlaque Description Comments  +----------+--------+--------+--------+-------------------+--------+  CCA Prox  117     117                                tortuous  +----------+--------+--------+--------+-------------------+--------+  CCA Distal73      10                                            +----------+--------+--------+--------+-------------------+--------+  ICA Prox  76      10              calcific and smooth          +----------+--------+--------+--------+-------------------+--------+  ICA Mid   98      17      1-39%                                +----------+--------+--------+--------+-------------------+--------+  ICA Distal81      15                                           +----------+--------+--------+--------+-------------------+--------+  ECA      114     6                                            +----------+--------+--------+--------+-------------------+--------+   +----------+--------+-------+----------------+-------------------+           PSV cm/sEDV cmsDescribe        Arm Pressure (mmHG)  +----------+--------+-------+----------------+-------------------+  Subclavian230    0      Multiphasic, TWO820                  +----------+--------+-------+----------------+-------------------+   +---------+--------+--+--------+--+---------+  VertebralPSV cm/s58EDV cm/s10Antegrade  +---------+--------+--+--------+--+---------+      Left Carotid Findings:  +----------+--------+--------+--------+------------------+--------+           PSV cm/sEDV cm/sStenosisPlaque DescriptionComments  +----------+--------+--------+--------+------------------+--------+  CCA Prox  110     10                                          +----------+--------+--------+--------+------------------+--------+  CCA Distal76      11                                          +----------+--------+--------+--------+------------------+--------+  ICA Prox  100     22              heterogenous                +----------+--------+--------+--------+------------------+--------+  ICA Mid   174     29      1-39%                                +----------+--------+--------+--------+------------------+--------+  ICA Distal158     29                                          +----------+--------+--------+--------+------------------+--------+  ECA      92      92                                          +----------+--------+--------+--------+------------------+--------+   +----------+--------+--------+--------+-------------------+           PSV cm/sEDV cm/sDescribeArm Pressure (mmHG)  +----------+--------+--------+--------+-------------------+  [id number]    66      Stenotic                     +----------+--------+--------+--------+-------------------+   +---------+--------+--+--------+--+---------------+  VertebralPSV cm/s76EDV cm/s14Bi- directional  +---------+--------+--+--------+--+---------------+    CTA chest independently reviewed No evidence of aortic pathology, bovine arch with a common trunk of the innominate and left carotid.     Assessment/Plan:   Jataya Wann is a 73 y.o.  female with with asymptomatic carotid artery disease.  She does not have any significant flow-limiting stenosis although the mid ICA on the left has a mildly elevated velocity at 174.  Overall her level of stenosis is 1 to 39% bilaterally.  I explained that we will plan to just recheck in 2 years.  If stable at that time can likely discontinue surveillance.  Follow-up in 2 years with a repeat carotid duplex   Norman GORMAN Serve MD [facility]

## 2024-10-04 NOTE — Progress Notes (Signed)
 [facility] - Clinic Note  10/06/2024   CHIEF COMPLAINT Patient presents for Retina Follow Up  HISTORY OF PRESENT ILLNESS: Samantha [name] is a 73 y.o. female who presents to the clinic today for:  HPI     Retina Follow Up   Patient presents with  Diabetic Retinopathy.  In both eyes.  This started 12 weeks ago.  Duration of 12 weeks.  Since onset it is stable.  I, the attending physician,  performed the HPI with the patient and updated documentation appropriately.        Comments   4 week retina follow up PDR OU and I'VE OS pt is reporting some blurred in OS and some pain at times at brow she denies any flashes or floaters Cosopt  and Brimonidine  TID OD she has not used latanoprost  states she does not have it her last  reading 260 few days ago after eating       Last edited by Valdemar Rogue, MD on 10/06/2024  1:26 PM.    Pt states she feels VA is a little worse. Feels she can't read as well, not using readers. No new health problems.   Referring physician: Cloria Annabella CROME, DO [address] [place],  [place] [address]  HISTORICAL INFORMATION:  Selected notes from the MEDICAL RECORD NUMBER Referred by [facility]: Dec 08, 2023 at [facility] Ocular Hx- previously managed by [facility] -- h/o anti-VEGF therapy OS x4, last injection was IVE OS on 12.11.24 PMH-   CURRENT MEDICATIONS: Current Outpatient Medications (Ophthalmic Drugs)  Medication Sig   brimonidine  (ALPHAGAN ) 0.2 % ophthalmic solution Place 1 drop into the right eye 2 (two) times daily.   ciprofloxacin  (CILOXAN ) 0.3 % ophthalmic solution Administer 2 drop into the right eye four times daily for  7 days   dorzolamide -timolol  (COSOPT ) 2-0.5 % ophthalmic solution Place 1 drop into the right eye 2 (two) times daily.   latanoprost  (XALATAN ) 0.005 % ophthalmic solution Place 1 drop into the right eye at bedtime.   ofloxacin (OCUFLOX) 0.3 % ophthalmic solution Place 1 drop into  the left eye 4 (four) times daily.   No current facility-administered medications for this visit. (Ophthalmic Drugs)   Current Outpatient Medications (Other)  Medication Sig   acetaminophen  (TYLENOL ) 325 MG tablet Take 1-2 tablets (325-650 mg total) by mouth every 4 (four) hours as needed for mild pain.   atorvastatin  (LIPITOR ) 80 MG tablet Take 1 tablet (80 mg total) by mouth daily. (Patient taking differently: Take 40 mg by mouth daily.)   carvedilol  (COREG ) 6.25 MG tablet Take 1 tablet (6.25 mg total) by mouth 2 (two) times daily with a meal.   clopidogrel  (PLAVIX ) 75 MG tablet TAKE 1 TABLET BY MOUTH EVERY DAY   insulin  glargine (LANTUS ) 100 UNIT/ML Solostar Pen Inject 15 Units into the skin daily.   lisinopril  (ZESTRIL ) 2.5 MG tablet Take 2.5 mg by mouth daily.   Multiple Vitamin (MULTIVITAMIN) capsule Take 1 capsule by mouth daily.   ondansetron  (ZOFRAN ) 4 MG tablet Take 1 tablet (4 mg total) by mouth every 6 (six) hours as needed for nausea.   oxyCODONE  (OXY IR/ROXICODONE ) 5 MG immediate release tablet Take 1 tablet (5 mg total) by mouth every 6 (six) hours as needed for severe pain.   Semaglutide (OZEMPIC, 0.25 OR 0.5 MG/DOSE, [place]) Inject 0.25 mg into the skin once a week.   sevelamer  carbonate (RENVELA ) 800 MG tablet TAKE 1 TABLET BY MOUTH 3  TIMES DAILY WITH MEALS.   sodium chloride  flush 0.9 % SOLN injection Flush cholecystectomy tube as directed by general surgery   No current facility-administered medications for this visit. (Other)   REVIEW OF SYSTEMS: ROS   Positive for: Endocrine, Eyes Negative for: Constitutional, Gastrointestinal, Neurological, Skin, Genitourinary, Musculoskeletal, HENT, Cardiovascular, Respiratory, Psychiatric, Allergic/Imm, Heme/Lymph Last edited by Resa Delon ORN, COT on 10/06/2024 12:49 PM.       ALLERGIES Allergies  Allergen Reactions   Chicken Allergy Other (See Comments)    headache   Perflutren  Other (See Comments)    Pt with back  pain as soon as administered.- patient refuted this in 2024   PAST MEDICAL HISTORY Past Medical History:  Diagnosis Date   Acute pyelonephritis    Bacteremia, escherichia coli 01/12/2015   Chest pain    Critical lower limb ischemia (HCC)    Hyperlipidemia    Hypertension    Left carotid bruit    PAD (peripheral artery disease)    Type II diabetes mellitus (HCC)    Past Surgical History:  Procedure Laterality Date   ABDOMINAL ANGIOGRAM  05/16/2015   Procedure: Abdominal Angiogram;  Surgeon: Dorn JINNY Lesches, MD;  Location: [facility];  Service: Cardiovascular;;   ABDOMINAL HYSTERECTOMY  ~ 2000   CHOLECYSTECTOMY N/A 09/23/2023   Procedure: LAPAROSCOPIC CHOLECYSTECTOMY;  Surgeon: Stevie Herlene Righter, MD;  Location: [facility] OR;  Service: General;  Laterality: N/A;   IR EXCHANGE BILIARY DRAIN  07/26/2023   IR FLUORO GUIDE CV LINE RIGHT  05/17/2023   IR PERC CHOLECYSTOSTOMY  05/23/2023   IR US  GUIDE VASC ACCESS RIGHT  05/17/2023   PERIPHERAL VASCULAR CATHETERIZATION Bilateral 05/16/2015   Procedure: Lower Extremity Angiography;  Surgeon: Dorn JINNY Lesches, MD; renal arteries widely patent, L-SFA 75%, L-pop 95%, L-peroneal 90%; R-SFA 40%, R-pop 50%, R-prox peroneal 90%; directional atherectomy L-SFA, p-pop, drug-eluting balloon angioplasty reducing the stenoses to 0, small linear dissection was not flow limiting       PERIPHERAL VASCULAR CATHETERIZATION Left 05/16/2015   Procedure: Peripheral Vascular Atherectomy;  Surgeon: Dorn JINNY Lesches, MD;  [facility]: [facility];  Service: Cardiovascular;  Laterality: Left;  sfa   FAMILY HISTORY Family History  Problem Relation Age of Onset   Hypertension Mother    SOCIAL HISTORY Social History   Tobacco Use   Smoking status: Never   Smokeless tobacco: Never  Vaping Use   Vaping status: Never Used  Substance Use Topics   Alcohol  use: Not Currently    Alcohol /week: 1.0 - 2.0 standard drink of alcohol     Types: 1 - 2 Cans of  beer per week    Comment: occas   Drug use: No       OPHTHALMIC EXAM:  Base Eye Exam     Visual Acuity (Snellen - Linear)       Right Left   Dist [place] LP 20/30   Dist ph [place] NI 20/25 -3         Tonometry (Tonopen, 12:55 PM)       Right Left   Pressure 25 14         Pupils       Pupils Dark Light Shape React APD   Right PERRL 5 5 Round NR APD   Left PERRL 3 2 Round Brisk None         Visual Fields       Left Right    Full    Restrictions  Total superior temporal, inferior  temporal, superior nasal, inferior nasal deficiencies         Extraocular Movement       Right Left    Full, Ortho Full, Ortho         Neuro/Psych     Oriented x3: Yes   Mood/Affect: Normal         Dilation     Both eyes: 2.5% Phenylephrine @ 12:55 PM           Slit Lamp and Fundus Exam     Slit Lamp Exam       Right Left   Lids/Lashes Dermatochalasis - upper lid Dermatochalasis - upper lid   Conjunctiva/Sclera temporal pinguecula nasal and temporal pinguecula, mild melanosis   Cornea trace PEE, mild tear film debris well healed cataract wound, mild tear film debris   Anterior Chamber moderate depth, closed angles deep and clear   Iris Round and dilated, +NVI Round and dilated, No NVI   Lens 3-4+ Nuclear sclerosis with brunescence, 3-4+ Cortical cataract, 3+ Posterior subcapsular cataract PC IOL in good position, trace posterior capsular opacification   Anterior Vitreous Vitreous syneresis mild syneresis, PVD, Vitreous condensation         Fundus Exam       Right Left   Disc No view 2+ Pallor, Sharp rim, mild PPA   C/D Ratio  0.3   Macula No view Flat, Good foveal reflex, scattered MA greatest temporal, +cystic changes   Vessels No view attenuated, Tortuous, focal sclerosis temporal macula   Periphery No view Attached, rare MA           IMAGING AND PROCEDURES  Imaging and Procedures for 10/06/2024  OCT, Retina - OU - Both Eyes       Right  Eye Quality was poor. Progression has no prior data. Findings include no IRF, no SRF, abnormal foveal contour (Extremely poor view, retina grossly attached, severe diffuse atrophy).   Left Eye Quality was good. Central Foveal Thickness: 259. Progression has been stable. Findings include normal foveal contour, no SRF, intraretinal hyper-reflective material, intraretinal fluid (Persistent Mild IRF/edema inferior and temporal macula ).   Notes *Images captured and stored on drive  Diagnosis / Impression:  OD: Extremely poor view, retina grossly attached, diffuse retinal atrophy--severe OS: Persistent Mild IRF/edema inferior and temporal macula   Clinical management:  See below  Abbreviations: NFP - Normal foveal profile. CME - cystoid macular edema. PED - pigment epithelial detachment. IRF - intraretinal fluid. SRF - subretinal fluid. EZ - ellipsoid zone. ERM - epiretinal membrane. ORA - outer retinal atrophy. ORT - outer retinal tubulation. SRHM - subretinal hyper-reflective material. IRHM - intraretinal hyper-reflective material      Intravitreal Injection, Pharmacologic Agent - OS - Left Eye       Time Out 10/06/2024. 1:08 PM. Confirmed correct patient, procedure, site, and patient consented.   Anesthesia Topical anesthesia was used. Anesthetic medications included Lidocaine  2%, Proparacaine 0.5%.   Procedure Preparation included 5% betadine to ocular surface, eyelid speculum. A (32g) needle was used.   Injection: 2 mg aflibercept  2 MG/0.05ML   Route: Intravitreal, Site: Left Eye   NDC: [id number], Lot: [id number], Expiration date: 11/26/2025, Waste: 0 mL   Post-op Post injection exam found visual acuity of at least counting fingers. The patient tolerated the procedure well. There were no complications. The patient received written and verbal post procedure care education.             ASSESSMENT/PLAN:   ICD-10-CM   1.  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 Neovascular glaucoma of right eye, severe stage  H40.51X3     4. 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 Current use of insulin  (HCC)  Z79.4     6. Long-term (current) use of injectable non-insulin  antidiabetic drugs  Z79.85     7. Hypertensive retinopathy of both eyes  H35.033     8. Essential hypertension  I10     9. Combined forms of age-related cataract of right eye  H25.811     10. Pseudophakia  Z96.1       1-6. Proliferative diabetic retinopathy OD        Severe NPDR OS  **delayed f/u: 3 mos instead of 4 wks (07.14.25 to 10.10.25)**  - previously managed at [facility] -- referred here by [facility] due to insurance  - h/o IVE OS x4, last injection 12.11.24 w/ Dr. Tobie  - here s/p IVA OU #1 (02.05.25) #2(03.10.25), #3 (04.07.25), #4 (05.05.25) -- IVA resistance  - s/p IVE OU #1 (06.02.25) - s/p IVE OS #2 (07.14.25) - initial exam showed +NVI w/ closed angles OD, IOP OD 40+ -- +neovascular glaucoma; OS with scattered MA/DBH, no NV - FA (02.05.25) OD: no images obtained (dense cataract); OS: no NV; scattered patches of vascular nonperfusion; scattered late leaking MA -- would likely benefit from PRP OS to areas of vascular nonperfusion - OCT shows OD: Extremely poor view, retina grossly attached, severe diffuse atrophy; OS: Persistent mild IRF/edema inferior and temporal macula at 3+mo since last inj OS - BCVA OD LP, OS 20/25 --stable - IOP OD 25, OS 14 - pt was [facility] at [facility] for NVG on 6.3.25 by Dr. Atalie Thompson -- no intervention was recommended, comfort care only, follow up PRN - recommend IVE OS #3  today, 10.10 25 for DME, will hold tx OD - pt wishes to proceed with injection OS - RBA of procedure discussed, questions answered - IVE informed consent obtained and signed, 06.02.25 (OU) - IVA informed consent obtained and signed, 02.05.25 (OU) - cont Cosopt  and Brimonidine  TID OD, latanoprost  at bedtime OD for NVG - f/u 10 wks, DFE, OCT, possible injection(s)  7,8. Hypertensive retinopathy OU - discussed importance of tight BP control  - monitor  9. Mixed Cataract OD - The symptoms of cataract, surgical options, and treatments and risks were discussed with patient. - discussed diagnosis and progression - under the expert management of Dr. Octavia  10. Pseudophakia OS  - s/p CE/IOL OS (January 2025, Dr. Octavia)  - IOL in good position, doing well  - monitor  Ophthalmic Meds Ordered this visit:  Meds ordered this encounter  Medications   aflibercept  (EYLEA ) SOLN 2 mg    Return in about 10 weeks (around 12/15/2024) for NPDR OS, DFE, OCT, Possible Injxn.  There are no Patient Instructions on file for this visit.  This document serves as a record of services personally performed by Redell JUDITHANN Hans, MD, PhD. It was created on their behalf by Delon Newness COT, an ophthalmic technician. The creation  of this record is the provider's dictation and/or activities during the visit.    Electronically signed by: Delon Newness COT 10.08.25 1:29 PM   This document serves as a record of services personally performed by Redell JUDITHANN Hans, MD, PhD. It was created on their behalf by Almetta Pesa, an ophthalmic technician. The creation of this record is the provider's dictation and/or activities during the visit.    Electronically signed by: Almetta Pesa, OA, 10/06/24  1:29 PM  Redell JUDITHANN Hans, M.D., Ph.D. Diseases & Surgery of the Retina and Vitreous [facility] 10/06/2024   I have reviewed the above documentation for accuracy and completeness, and I  agree with the above. Redell JUDITHANN Hans, M.D., Ph.D. 10/06/24 1:31 PM   Abbreviations: M myopia (nearsighted); A astigmatism; H hyperopia (farsighted); P presbyopia; Mrx spectacle prescription;  CTL contact lenses; OD right eye; OS left eye; OU both eyes  XT exotropia; ET esotropia; PEK punctate epithelial keratitis; PEE punctate epithelial erosions; DES dry eye syndrome; MGD meibomian gland dysfunction; ATs artificial tears; PFAT's preservative free artificial tears; NSC nuclear sclerotic cataract; PSC posterior subcapsular cataract; ERM epi-retinal membrane; PVD posterior vitreous detachment; RD retinal detachment; DM diabetes mellitus; DR diabetic retinopathy; NPDR non-proliferative diabetic retinopathy; PDR proliferative diabetic retinopathy; CSME clinically significant macular edema; DME diabetic macular edema; dbh dot blot hemorrhages; CWS cotton wool spot; POAG primary open angle glaucoma; C/D cup-to-disc ratio; HVF humphrey visual field; GVF goldmann visual field; OCT optical coherence tomography; IOP intraocular pressure; BRVO Branch retinal vein occlusion; CRVO central retinal vein occlusion; CRAO central retinal artery occlusion; BRAO branch retinal artery occlusion; RT retinal tear; SB scleral buckle; PPV pars plana vitrectomy; VH Vitreous hemorrhage; PRP panretinal laser photocoagulation; IVK intravitreal kenalog; VMT vitreomacular traction; MH Macular hole;  NVD neovascularization of the disc; NVE neovascularization elsewhere; AREDS age related eye disease study; ARMD age related macular degeneration; POAG primary open angle glaucoma; EBMD epithelial/anterior basement membrane dystrophy; ACIOL anterior chamber intraocular lens; IOL intraocular lens; PCIOL posterior chamber intraocular lens; Phaco/IOL phacoemulsification with intraocular lens placement; PRK photorefractive keratectomy; LASIK laser assisted in situ keratomileusis; HTN hypertension; DM diabetes mellitus; COPD chronic obstructive  pulmonary disease

## 2024-10-06 ENCOUNTER — Ambulatory Visit (INDEPENDENT_AMBULATORY_CARE_PROVIDER_SITE_OTHER): Admitting: Ophthalmology

## 2024-10-06 ENCOUNTER — Encounter (INDEPENDENT_AMBULATORY_CARE_PROVIDER_SITE_OTHER): Payer: Self-pay | Admitting: Ophthalmology

## 2024-10-06 DIAGNOSIS — I10 Essential (primary) hypertension: Secondary | ICD-10-CM

## 2024-10-06 DIAGNOSIS — H211X1 Other vascular disorders of iris and ciliary body, right eye: Secondary | ICD-10-CM | POA: Diagnosis not present

## 2024-10-06 DIAGNOSIS — E113591 Type 2 diabetes mellitus with proliferative diabetic retinopathy without macular edema, right eye: Secondary | ICD-10-CM

## 2024-10-06 DIAGNOSIS — E113412 Type 2 diabetes mellitus with severe nonproliferative diabetic retinopathy with macular edema, left eye: Secondary | ICD-10-CM

## 2024-10-06 DIAGNOSIS — Z961 Presence of intraocular lens: Secondary | ICD-10-CM

## 2024-10-06 DIAGNOSIS — Z7985 Long-term (current) use of injectable non-insulin antidiabetic drugs: Secondary | ICD-10-CM

## 2024-10-06 DIAGNOSIS — Z794 Long term (current) use of insulin: Secondary | ICD-10-CM | POA: Diagnosis not present

## 2024-10-06 DIAGNOSIS — H35033 Hypertensive retinopathy, bilateral: Secondary | ICD-10-CM

## 2024-10-06 DIAGNOSIS — H25811 Combined forms of age-related cataract, right eye: Secondary | ICD-10-CM

## 2024-10-06 DIAGNOSIS — H4051X3 Glaucoma secondary to other eye disorders, right eye, severe stage: Secondary | ICD-10-CM

## 2024-10-06 MED ORDER — AFLIBERCEPT 2MG/0.05ML IZ SOLN FOR KALEIDOSCOPE
2.0000 mg | INTRAVITREAL | Status: AC | PRN
  Administered 2024-10-06: 2 mg via INTRAVITREAL

## 2024-11-06 ENCOUNTER — Inpatient Hospital Stay (HOSPITAL_COMMUNITY)
Admission: EM | Admit: 2024-11-06 | Discharge: 2024-11-10 | DRG: 252 | Disposition: A | Discharge status: Home / Self Care | Source: Ambulatory Visit | Attending: Family Medicine | Admitting: Family Medicine

## 2024-11-06 ENCOUNTER — Emergency Department (HOSPITAL_COMMUNITY)

## 2024-11-06 ENCOUNTER — Encounter (HOSPITAL_COMMUNITY): Payer: Self-pay

## 2024-11-06 ENCOUNTER — Other Ambulatory Visit: Payer: Self-pay

## 2024-11-06 DIAGNOSIS — Z603 Acculturation difficulty: Secondary | ICD-10-CM | POA: Diagnosis present

## 2024-11-06 DIAGNOSIS — I70245 Atherosclerosis of native arteries of left leg with ulceration of other part of foot: Secondary | ICD-10-CM | POA: Diagnosis not present

## 2024-11-06 DIAGNOSIS — E1165 Type 2 diabetes mellitus with hyperglycemia: Secondary | ICD-10-CM | POA: Diagnosis present

## 2024-11-06 DIAGNOSIS — E1122 Type 2 diabetes mellitus with diabetic chronic kidney disease: Secondary | ICD-10-CM

## 2024-11-06 DIAGNOSIS — I739 Peripheral vascular disease, unspecified: Secondary | ICD-10-CM | POA: Diagnosis present

## 2024-11-06 DIAGNOSIS — D631 Anemia in chronic kidney disease: Secondary | ICD-10-CM | POA: Diagnosis present

## 2024-11-06 DIAGNOSIS — L089 Local infection of the skin and subcutaneous tissue, unspecified: Secondary | ICD-10-CM | POA: Diagnosis present

## 2024-11-06 DIAGNOSIS — H409 Unspecified glaucoma: Secondary | ICD-10-CM | POA: Diagnosis present

## 2024-11-06 DIAGNOSIS — Z90710 Acquired absence of both cervix and uterus: Secondary | ICD-10-CM

## 2024-11-06 DIAGNOSIS — E11621 Type 2 diabetes mellitus with foot ulcer: Secondary | ICD-10-CM

## 2024-11-06 DIAGNOSIS — Z794 Long term (current) use of insulin: Secondary | ICD-10-CM

## 2024-11-06 DIAGNOSIS — Z743 Need for continuous supervision: Secondary | ICD-10-CM | POA: Diagnosis not present

## 2024-11-06 DIAGNOSIS — N186 End stage renal disease: Secondary | ICD-10-CM | POA: Diagnosis present

## 2024-11-06 DIAGNOSIS — E785 Hyperlipidemia, unspecified: Secondary | ICD-10-CM | POA: Diagnosis present

## 2024-11-06 DIAGNOSIS — E11628 Type 2 diabetes mellitus with other skin complications: Secondary | ICD-10-CM | POA: Diagnosis present

## 2024-11-06 DIAGNOSIS — E1151 Type 2 diabetes mellitus with diabetic peripheral angiopathy without gangrene: Secondary | ICD-10-CM

## 2024-11-06 DIAGNOSIS — L97529 Non-pressure chronic ulcer of other part of left foot with unspecified severity: Secondary | ICD-10-CM | POA: Diagnosis present

## 2024-11-06 DIAGNOSIS — E118 Type 2 diabetes mellitus with unspecified complications: Secondary | ICD-10-CM

## 2024-11-06 DIAGNOSIS — Z79899 Other long term (current) drug therapy: Secondary | ICD-10-CM

## 2024-11-06 DIAGNOSIS — Z5941 Food insecurity: Secondary | ICD-10-CM

## 2024-11-06 DIAGNOSIS — M869 Osteomyelitis, unspecified: Secondary | ICD-10-CM | POA: Diagnosis present

## 2024-11-06 DIAGNOSIS — D509 Iron deficiency anemia, unspecified: Secondary | ICD-10-CM | POA: Diagnosis present

## 2024-11-06 DIAGNOSIS — Z7985 Long-term (current) use of injectable non-insulin antidiabetic drugs: Secondary | ICD-10-CM

## 2024-11-06 DIAGNOSIS — L03032 Cellulitis of left toe: Secondary | ICD-10-CM | POA: Diagnosis not present

## 2024-11-06 DIAGNOSIS — E13621 Other specified diabetes mellitus with foot ulcer: Principal | ICD-10-CM

## 2024-11-06 DIAGNOSIS — Z5948 Other specified lack of adequate food: Secondary | ICD-10-CM

## 2024-11-06 DIAGNOSIS — R531 Weakness: Secondary | ICD-10-CM | POA: Diagnosis not present

## 2024-11-06 DIAGNOSIS — I10 Essential (primary) hypertension: Secondary | ICD-10-CM | POA: Diagnosis not present

## 2024-11-06 DIAGNOSIS — L97509 Non-pressure chronic ulcer of other part of unspecified foot with unspecified severity: Secondary | ICD-10-CM | POA: Diagnosis present

## 2024-11-06 DIAGNOSIS — Z888 Allergy status to other drugs, medicaments and biological substances status: Secondary | ICD-10-CM

## 2024-11-06 DIAGNOSIS — I70262 Atherosclerosis of native arteries of extremities with gangrene, left leg: Secondary | ICD-10-CM | POA: Diagnosis not present

## 2024-11-06 DIAGNOSIS — Z7902 Long term (current) use of antithrombotics/antiplatelets: Secondary | ICD-10-CM

## 2024-11-06 DIAGNOSIS — Z8249 Family history of ischemic heart disease and other diseases of the circulatory system: Secondary | ICD-10-CM

## 2024-11-06 DIAGNOSIS — Z8673 Personal history of transient ischemic attack (TIA), and cerebral infarction without residual deficits: Secondary | ICD-10-CM

## 2024-11-06 DIAGNOSIS — E1140 Type 2 diabetes mellitus with diabetic neuropathy, unspecified: Secondary | ICD-10-CM | POA: Diagnosis present

## 2024-11-06 DIAGNOSIS — Z91018 Allergy to other foods: Secondary | ICD-10-CM

## 2024-11-06 DIAGNOSIS — Z66 Do not resuscitate: Secondary | ICD-10-CM | POA: Diagnosis present

## 2024-11-06 DIAGNOSIS — Z992 Dependence on renal dialysis: Secondary | ICD-10-CM

## 2024-11-06 DIAGNOSIS — I120 Hypertensive chronic kidney disease with stage 5 chronic kidney disease or end stage renal disease: Secondary | ICD-10-CM | POA: Diagnosis present

## 2024-11-06 DIAGNOSIS — D539 Nutritional anemia, unspecified: Secondary | ICD-10-CM | POA: Diagnosis present

## 2024-11-06 HISTORY — DX: Cerebral infarction, unspecified: I63.9

## 2024-11-06 HISTORY — DX: End stage renal disease: N18.6

## 2024-11-06 LAB — COMPREHENSIVE METABOLIC PANEL WITH GFR
ALT: 11 U/L (ref 0–44)
AST: 19 U/L (ref 15–41)
Albumin: 4 g/dL (ref 3.5–5.0)
Alkaline Phosphatase: 139 U/L — ABNORMAL HIGH (ref 38–126)
Anion gap: 13 (ref 5–15)
BUN: 31 mg/dL — ABNORMAL HIGH (ref 8–23)
CO2: 24 mmol/L (ref 22–32)
Calcium: 9.7 mg/dL (ref 8.9–10.3)
Chloride: 100 mmol/L (ref 98–111)
Creatinine, Ser: 3.56 mg/dL — ABNORMAL HIGH (ref 0.44–1.00)
GFR, Estimated: 13 mL/min — ABNORMAL LOW (ref >60)
Glucose, Bld: 292 mg/dL — ABNORMAL HIGH (ref 70–99)
Potassium: 3.7 mmol/L (ref 3.5–5.1)
Sodium: 137 mmol/L (ref 135–145)
Total Bilirubin: 0.5 mg/dL (ref 0.0–1.2)
Total Protein: 7.2 g/dL (ref 6.5–8.1)

## 2024-11-06 LAB — CBC WITH DIFFERENTIAL/PLATELET
Abs Immature Granulocytes: 0.02 K/uL (ref 0.00–0.07)
Basophils Absolute: 0.1 K/uL (ref 0.0–0.1)
Basophils Relative: 1 %
Eosinophils Absolute: 0.2 K/uL (ref 0.0–0.5)
Eosinophils Relative: 2 %
HCT: 29.5 % — ABNORMAL LOW (ref 36.0–46.0)
Hemoglobin: 9.8 g/dL — ABNORMAL LOW (ref 12.0–15.0)
Immature Granulocytes: 0 %
Lymphocytes Relative: 15 %
Lymphs Abs: 1.3 K/uL (ref 0.7–4.0)
MCH: 34.4 pg — ABNORMAL HIGH (ref 26.0–34.0)
MCHC: 33.2 g/dL (ref 30.0–36.0)
MCV: 103.5 fL — ABNORMAL HIGH (ref 80.0–100.0)
Monocytes Absolute: 0.6 K/uL (ref 0.1–1.0)
Monocytes Relative: 7 %
Neutro Abs: 6.1 K/uL (ref 1.7–7.7)
Neutrophils Relative %: 75 %
Platelets: 246 K/uL (ref 150–400)
RBC: 2.85 MIL/uL — ABNORMAL LOW (ref 3.87–5.11)
RDW: 12.4 % (ref 11.5–15.5)
WBC: 8.2 K/uL (ref 4.0–10.5)
nRBC: 0 % (ref 0.0–0.2)

## 2024-11-06 LAB — PREALBUMIN: Prealbumin: 21 mg/dL (ref 18–38)

## 2024-11-06 LAB — HEPATITIS B SURFACE ANTIGEN: Hepatitis B Surface Ag: NONREACTIVE

## 2024-11-06 LAB — GLUCOSE, CAPILLARY
Glucose-Capillary: 280 mg/dL — ABNORMAL HIGH (ref 70–99)
Glucose-Capillary: 262 mg/dL — ABNORMAL HIGH (ref 70–99)
Glucose-Capillary: 203 mg/dL — ABNORMAL HIGH (ref 70–99)

## 2024-11-06 LAB — C-REACTIVE PROTEIN: CRP: 1.4 mg/dL — ABNORMAL HIGH (ref <1.0)

## 2024-11-06 LAB — I-STAT CG4 LACTIC ACID, ED
Lactic Acid, Venous: 1 mmol/L (ref 0.5–1.9)
Lactic Acid, Venous: 1.6 mmol/L (ref 0.5–1.9)
Lactic Acid, Venous: 1.5 mmol/L (ref 0.5–1.9)

## 2024-11-06 LAB — HEMOGLOBIN A1C
Hgb A1c MFr Bld: 9.1 % — ABNORMAL HIGH (ref 4.8–5.6)
Mean Plasma Glucose: 214.47 mg/dL

## 2024-11-06 LAB — SEDIMENTATION RATE: Sed Rate: 20 mm/h (ref 0–22)

## 2024-11-06 MED ORDER — SEVELAMER CARBONATE 800 MG PO TABS
800.0000 mg | ORAL_TABLET | Freq: Three times a day (TID) | ORAL | Status: DC
  Administered 2024-11-06 – 2024-11-09 (×5): 800 mg via ORAL
  Filled 2024-11-06 (×6): qty 1

## 2024-11-06 MED ORDER — SODIUM CHLORIDE 0.9% FLUSH
3.0000 mL | Freq: Two times a day (BID) | INTRAVENOUS | Status: DC
  Administered 2024-11-06 – 2024-11-08 (×3): 3 mL via INTRAVENOUS

## 2024-11-06 MED ORDER — ACETAMINOPHEN 650 MG RE SUPP: 650.0000 mg | Freq: Four times a day (QID) | RECTAL | Status: DC | PRN

## 2024-11-06 MED ORDER — SODIUM CHLORIDE 0.9 % IV SOLN: 250.0000 mL | INTRAVENOUS | Status: AC | PRN

## 2024-11-06 MED ORDER — SODIUM CHLORIDE 0.9 % IV SOLN
3.0000 g | Freq: Once | INTRAVENOUS | Status: AC
  Administered 2024-11-06: 3 g via INTRAVENOUS
  Filled 2024-11-06: qty 8

## 2024-11-06 MED ORDER — INSULIN ASPART 100 UNIT/ML IJ SOLN
0.0000 [IU] | Freq: Three times a day (TID) | INTRAMUSCULAR | Status: DC
  Administered 2024-11-06: 3 [IU] via SUBCUTANEOUS
  Administered 2024-11-07: 2 [IU] via SUBCUTANEOUS
  Administered 2024-11-08: 3 [IU] via SUBCUTANEOUS
  Administered 2024-11-08: 2 [IU] via SUBCUTANEOUS
  Filled 2024-11-06: qty 3
  Filled 2024-11-06: qty 2
  Filled 2024-11-06: qty 3
  Filled 2024-11-06: qty 2

## 2024-11-06 MED ORDER — CARVEDILOL 6.25 MG PO TABS
6.2500 mg | ORAL_TABLET | Freq: Two times a day (BID) | ORAL | Status: DC
  Administered 2024-11-06 – 2024-11-09 (×6): 6.25 mg via ORAL
  Filled 2024-11-06 (×3): qty 1
  Filled 2024-11-06: qty 2
  Filled 2024-11-06 (×3): qty 1

## 2024-11-06 MED ORDER — CHLORHEXIDINE GLUCONATE CLOTH 2 % EX PADS
6.0000 | MEDICATED_PAD | Freq: Every day | CUTANEOUS | Status: DC
  Administered 2024-11-07 – 2024-11-10 (×4): 6 via TOPICAL

## 2024-11-06 MED ORDER — SODIUM CHLORIDE 0.9 % IV SOLN
3.0000 g | Freq: Two times a day (BID) | INTRAVENOUS | Status: DC
  Administered 2024-11-06 – 2024-11-09 (×6): 3 g via INTRAVENOUS
  Filled 2024-11-06 (×10): qty 8

## 2024-11-06 MED ORDER — ONDANSETRON HCL 4 MG/2ML IJ SOLN
4.0000 mg | Freq: Four times a day (QID) | INTRAMUSCULAR | Status: DC | PRN
Start: 2024-11-06 — End: 2024-11-11
  Administered 2024-11-07: 4 mg via INTRAVENOUS

## 2024-11-06 MED ORDER — LATANOPROST 0.005 % OP SOLN
1.0000 [drp] | Freq: Every day | OPHTHALMIC | Status: DC
  Administered 2024-11-06 – 2024-11-09 (×4): 1 [drp] via OPHTHALMIC
  Filled 2024-11-06: qty 2.5

## 2024-11-06 MED ORDER — SODIUM CHLORIDE 0.9% FLUSH
3.0000 mL | Freq: Two times a day (BID) | INTRAVENOUS | Status: DC
  Administered 2024-11-06 – 2024-11-09 (×6): 3 mL via INTRAVENOUS

## 2024-11-06 MED ORDER — LISINOPRIL 5 MG PO TABS
2.5000 mg | ORAL_TABLET | Freq: Every day | ORAL | Status: DC
  Administered 2024-11-06 – 2024-11-10 (×4): 2.5 mg via ORAL
  Filled 2024-11-06 (×7): qty 1

## 2024-11-06 MED ORDER — LINEZOLID 600 MG PO TABS
600.0000 mg | ORAL_TABLET | Freq: Two times a day (BID) | ORAL | Status: DC
  Administered 2024-11-06 – 2024-11-10 (×9): 600 mg via ORAL
  Filled 2024-11-06 (×10): qty 1

## 2024-11-06 MED ORDER — DORZOLAMIDE HCL-TIMOLOL MAL 2-0.5 % OP SOLN
1.0000 [drp] | Freq: Two times a day (BID) | OPHTHALMIC | Status: DC
  Administered 2024-11-06 – 2024-11-09 (×6): 1 [drp] via OPHTHALMIC
  Filled 2024-11-06: qty 10

## 2024-11-06 MED ORDER — CLOPIDOGREL BISULFATE 75 MG PO TABS
75.0000 mg | ORAL_TABLET | Freq: Every day | ORAL | Status: DC
  Administered 2024-11-07 – 2024-11-10 (×3): 75 mg via ORAL
  Filled 2024-11-06 (×3): qty 1

## 2024-11-06 MED ORDER — ONDANSETRON HCL 4 MG PO TABS: 4.0000 mg | ORAL_TABLET | Freq: Four times a day (QID) | ORAL | Status: DC | PRN

## 2024-11-06 MED ORDER — INSULIN ASPART 100 UNIT/ML IJ SOLN
0.0000 [IU] | Freq: Every day | INTRAMUSCULAR | Status: DC
  Administered 2024-11-06: 2 [IU] via SUBCUTANEOUS
  Administered 2024-11-07: 5 [IU] via SUBCUTANEOUS
  Administered 2024-11-08: 2 [IU] via SUBCUTANEOUS
  Filled 2024-11-06 (×2): qty 2
  Filled 2024-11-06: qty 5

## 2024-11-06 MED ORDER — ENOXAPARIN SODIUM 30 MG/0.3ML IJ SOSY
30.0000 mg | PREFILLED_SYRINGE | INTRAMUSCULAR | Status: DC
  Administered 2024-11-08 – 2024-11-10 (×2): 30 mg via SUBCUTANEOUS
  Filled 2024-11-06 (×2): qty 0.3

## 2024-11-06 MED ORDER — INSULIN GLARGINE-YFGN 100 UNIT/ML ~~LOC~~ SOLN
20.0000 [IU] | Freq: Every day | SUBCUTANEOUS | Status: DC
  Administered 2024-11-06 – 2024-11-07 (×2): 20 [IU] via SUBCUTANEOUS
  Filled 2024-11-06 (×3): qty 0.2

## 2024-11-06 MED ORDER — ACETAMINOPHEN 325 MG PO TABS: 650.0000 mg | ORAL_TABLET | Freq: Four times a day (QID) | ORAL | Status: DC | PRN

## 2024-11-06 MED ORDER — BRIMONIDINE TARTRATE 0.2 % OP SOLN
1.0000 [drp] | Freq: Two times a day (BID) | OPHTHALMIC | Status: DC
  Administered 2024-11-06 – 2024-11-09 (×6): 1 [drp] via OPHTHALMIC
  Filled 2024-11-06: qty 5

## 2024-11-06 MED ORDER — SODIUM CHLORIDE 0.9% FLUSH: 3.0000 mL | INTRAVENOUS | Status: DC | PRN

## 2024-11-06 MED ORDER — VANCOMYCIN HCL IN DEXTROSE 1-5 GM/200ML-% IV SOLN
1000.0000 mg | Freq: Once | INTRAVENOUS | Status: AC
  Administered 2024-11-06: 1000 mg via INTRAVENOUS
  Filled 2024-11-06: qty 200

## 2024-11-06 MED ORDER — OXYCODONE HCL 5 MG PO TABS
5.0000 mg | ORAL_TABLET | ORAL | Status: DC | PRN
  Administered 2024-11-06: 5 mg via ORAL
  Filled 2024-11-06: qty 1

## 2024-11-06 NOTE — Plan of Care (Signed)

## 2024-11-06 NOTE — H&P (Addendum)
 History and Physical    Patient: Samantha [name] FMW:[id number] DOB: 03-28-1951 DOA: 11/06/2024 DOS: the patient was seen and examined on 11/06/2024 PCP: Cloria Annabella CROME, DO  Patient coming from: Home  Chief Complaint:  Chief Complaint  Patient presents with   Wound Check   HPI:  73 year old Spanish-speaking woman PMH including ESRD TTS, diabetes, critical lower limb ischemia, status post angiography 2016 with arthrectomy, presented with several day history of left toe pain.  Workup concerning for diabetic foot infection.  Admitted for further evaluation.  History obtained via iPad interpreter  Reports no apparent injury but she did notice a blister on her left fourth toe several days ago, subsequently she noticed bleeding and then a red streak.  She has pain with palpation or movement but not at rest.  Previous angiography on left leg years ago.  Review of systems unrevealing.  She does not currently see a podiatrist.  Review of Systems: No reported fever, visual changes, sore throat, rash, chest pain, shortness of breath, dysuria  Past Medical History:  Diagnosis Date   Acute pyelonephritis    Bacteremia, escherichia coli 01/12/2015   Chest pain    Critical lower limb ischemia (HCC)    Hyperlipidemia    Hypertension    Left carotid bruit    PAD (peripheral artery disease)    Stroke (HCC)    Type II diabetes mellitus (HCC)    Past Surgical History:  Procedure Laterality Date   ABDOMINAL ANGIOGRAM  05/16/2015   Procedure: Abdominal Angiogram;  Surgeon: Dorn JINNY Lesches, MD;  Location: [facility];  Service: Cardiovascular;;   ABDOMINAL HYSTERECTOMY  ~ 2000   CHOLECYSTECTOMY N/A 09/23/2023   Procedure: LAPAROSCOPIC CHOLECYSTECTOMY;  Surgeon: Stevie Herlene Righter, MD;  Location: [facility] OR;  Service: General;  Laterality: N/A;   IR EXCHANGE BILIARY DRAIN  07/26/2023   IR FLUORO GUIDE CV LINE RIGHT  05/17/2023   IR PERC CHOLECYSTOSTOMY  05/23/2023   IR US  GUIDE VASC  ACCESS RIGHT  05/17/2023   PERIPHERAL VASCULAR CATHETERIZATION Bilateral 05/16/2015   Procedure: Lower Extremity Angiography;  Surgeon: Dorn JINNY Lesches, MD; renal arteries widely patent, L-SFA 75%, L-pop 95%, L-peroneal 90%; R-SFA 40%, R-pop 50%, R-prox peroneal 90%; directional atherectomy L-SFA, p-pop, drug-eluting balloon angioplasty reducing the stenoses to 0, small linear dissection was not flow limiting       PERIPHERAL VASCULAR CATHETERIZATION Left 05/16/2015   Procedure: Peripheral Vascular Atherectomy;  Surgeon: Dorn JINNY Lesches, MD;  [facility]: [facility];  Service: Cardiovascular;  Laterality: Left;  sfa   Social History:  reports that she has never smoked. She has never used smokeless tobacco. She reports that she does not currently use alcohol  after a past usage of about 1.0 - 2.0 standard drink of alcohol  per week. She reports that she does not use drugs.  Allergies  Allergen Reactions   Chicken Allergy Other (See Comments)    headache   Perflutren  Other (See Comments)    Pt with back pain as soon as administered.- patient refuted this in 2024    Family History  Problem Relation Age of Onset   Hypertension Mother     Prior to Admission medications   Medication Sig Start Date End Date Taking? Authorizing Provider  acetaminophen  (TYLENOL ) 325 MG tablet Take 1-2 tablets (325-650 mg total) by mouth every 4 (four) hours as needed for mild pain. 06/07/23   Setzer, Nena JINNY, PA-C  atorvastatin  (LIPITOR ) 80 MG tablet Take 1 tablet (80 mg total) by  mouth daily. Patient taking differently: Take 40 mg by mouth daily. 06/18/23   Paseda, Folashade R, FNP  brimonidine  (ALPHAGAN ) 0.2 % ophthalmic solution Place 1 drop into the right eye 2 (two) times daily. 02/02/24   Valdemar Rogue, MD  carvedilol  (COREG ) 6.25 MG tablet Take 1 tablet (6.25 mg total) by mouth 2 (two) times daily with a meal. 06/18/23   Paseda, Folashade R, FNP  ciprofloxacin  (CILOXAN ) 0.3 % ophthalmic solution Administer  2 drop into the right eye four times daily for  7 days 06/30/23   Paseda, Folashade R, FNP  clopidogrel  (PLAVIX ) 75 MG tablet TAKE 1 TABLET BY MOUTH EVERY DAY 09/10/23   Paseda, Folashade R, FNP  dorzolamide -timolol  (COSOPT ) 2-0.5 % ophthalmic solution Place 1 drop into the right eye 2 (two) times daily. 02/02/24   Valdemar Rogue, MD  insulin  glargine (LANTUS ) 100 UNIT/ML Solostar Pen Inject 15 Units into the skin daily. 11/29/23   Paseda, Folashade R, FNP  latanoprost  (XALATAN ) 0.005 % ophthalmic solution Place 1 drop into the right eye at bedtime. 05/29/24   Valdemar Rogue, MD  lisinopril  (ZESTRIL ) 2.5 MG tablet Take 2.5 mg by mouth daily.    [provider]  Multiple Vitamin (MULTIVITAMIN) capsule Take 1 capsule by mouth daily.    [provider]  ofloxacin (OCUFLOX) 0.3 % ophthalmic solution Place 1 drop into the left eye 4 (four) times daily. 09/10/23   [provider]  ondansetron  (ZOFRAN ) 4 MG tablet Take 1 tablet (4 mg total) by mouth every 6 (six) hours as needed for nausea. 06/07/23   Setzer, Sandra J, PA-C  oxyCODONE  (OXY IR/ROXICODONE ) 5 MG immediate release tablet Take 1 tablet (5 mg total) by mouth every 6 (six) hours as needed for severe pain. 09/23/23   Kinsinger, Herlene Righter, MD  Semaglutide (OZEMPIC, 0.25 OR 0.5 MG/DOSE, [place]) Inject 0.25 mg into the skin once a week.    [provider]  sevelamer  carbonate (RENVELA ) 800 MG tablet TAKE 1 TABLET BY MOUTH 3 TIMES DAILY WITH MEALS. 11/29/23   Paseda, Folashade R, FNP  sodium chloride  flush 0.9 % SOLN injection Flush cholecystectomy tube as directed by general surgery 06/18/23   Paseda, Folashade R, FNP    Physical Exam: Vitals:   11/06/24 1204  BP: (!) 170/83  Pulse: 70  Resp: 16  Temp: 98.7 F (37.1 C)  TempSrc: Oral  SpO2: 100%   Physical Exam Vitals reviewed.  Constitutional:      General: She is not in acute distress.    Appearance: She is not ill-appearing or toxic-appearing.  Cardiovascular:      Rate and Rhythm: Normal rate and regular rhythm.     Heart sounds: No murmur heard.    Comments: Right dorsalis pedis pulse 2+, left foot appears well-perfused but no DP pulse palpated Pulmonary:     Effort: Pulmonary effort is normal. No respiratory distress.     Breath sounds: No wheezing, rhonchi or rales.  Musculoskeletal:     Right lower leg: No edema.     Left lower leg: No edema.  Skin:    Comments: Left fourth toe erythematous, red streak up dorsum of foot  Neurological:     Mental Status: She is alert.  Psychiatric:        Mood and Affect: Mood normal.        Behavior: Behavior normal.        Data Reviewed: CMP noted Lactic acid within normal limits Hemoglobin 9.8 Lactic acid within normal limits  Left foot x-ray no acute abnormalities  Assessment and Plan: Left fourth toe diabetic foot infection [facility] PMH left lower extremity critical limb ischemia 2016 status post angiography and arthrectomy Admit for IV antibiotics.  Plain film no evidence of osteomyelitis.  Check MRI. Will consult vascular surgery given history Will consult podiatry  DM type 2 Random blood sugar 292  Macrocytic anemia Appears to be close to baseline.  Check anemia panel.  ESRD TTS Routine consultation nephrology for hemodialysis tomorrow   Advance Care Planning: DNR confirmed with patient via interpreter  Consults: Vascular surgery Jaylene), podiatry ([place]), nephrology [place])  Family Communication: None requested  Severity of Illness: The appropriate patient status for this patient is OBSERVATION. Observation status is judged to be reasonable and necessary in order to provide the required intensity of service to ensure the patient's safety. The patient's presenting symptoms, physical exam findings, and initial radiographic and laboratory data in the context of their medical condition is felt to place them at decreased risk for further clinical deterioration. Furthermore, it is  anticipated that the patient will be medically stable for discharge from the hospital within 2 midnights of admission.   Author: Toribio Door, MD 11/06/2024 3:06 PM  For on call review [email or web address].

## 2024-11-06 NOTE — Consult Note (Signed)
 Hospital Consult    Reason for Consult: Lower extremity tissue loss with nonpalpable pulses Requesting Physician: Hospital medicine MRN #:  [id number]  History of Present Illness: This is a 73 y.o. female with history of PAD, end-stage renal disease, DM 2, who presents with cellulitis involving the left fourth toe.  Vascular surgery was called after she was found to have nonpalpable pulses in the foot.  Prior vascular intervention includes orbital atherectomy of the SFA and popliteal arteries in 2016 with Dr. Court.  On exam, Samantha [name] was doing well.  I used interpreter services to discuss the foot.  She stated the redness was first appreciated on Friday.  She came to the emergency department due to worsening, and streaking up the foot.  She denied fevers, chills.  States she does not have significant pain. Denies symptoms of claudication, ischemic rest pain, tissue loss.  Past Medical History:  Diagnosis Date   Acute pyelonephritis    Bacteremia, escherichia coli 01/12/2015   Chest pain    Critical lower limb ischemia (HCC)    ESRD (end stage renal disease) (HCC)    Hyperlipidemia    Hypertension    Left carotid bruit    PAD (peripheral artery disease)    Stroke (HCC)    Type II diabetes mellitus (HCC)     Past Surgical History:  Procedure Laterality Date   ABDOMINAL ANGIOGRAM  05/16/2015   Procedure: Abdominal Angiogram;  Surgeon: Dorn JINNY Court, MD;  Location: [facility];  Service: Cardiovascular;;   ABDOMINAL HYSTERECTOMY  ~ 2000   CHOLECYSTECTOMY N/A 09/23/2023   Procedure: LAPAROSCOPIC CHOLECYSTECTOMY;  Surgeon: Stevie Herlene Righter, MD;  Location: [facility] OR;  Service: General;  Laterality: N/A;   IR EXCHANGE BILIARY DRAIN  07/26/2023   IR FLUORO GUIDE CV LINE RIGHT  05/17/2023   IR PERC CHOLECYSTOSTOMY  05/23/2023   IR US  GUIDE VASC ACCESS RIGHT  05/17/2023   PERIPHERAL VASCULAR CATHETERIZATION Bilateral 05/16/2015   Procedure: Lower Extremity Angiography;   Surgeon: Dorn JINNY Court, MD; renal arteries widely patent, L-SFA 75%, L-pop 95%, L-peroneal 90%; R-SFA 40%, R-pop 50%, R-prox peroneal 90%; directional atherectomy L-SFA, p-pop, drug-eluting balloon angioplasty reducing the stenoses to 0, small linear dissection was not flow limiting       PERIPHERAL VASCULAR CATHETERIZATION Left 05/16/2015   Procedure: Peripheral Vascular Atherectomy;  Surgeon: Dorn JINNY Court, MD;  Location: [facility];  Service: Cardiovascular;  Laterality: Left;  sfa    Allergies  Allergen Reactions   Chicken Allergy Other (See Comments)    headache   Perflutren  Other (See Comments)    Pt with back pain as soon as administered.- patient refuted this in 2024    Prior to Admission medications   Medication Sig Start Date End Date Taking? Authorizing Provider  acetaminophen  (TYLENOL ) 325 MG tablet Take 1-2 tablets (325-650 mg total) by mouth every 4 (four) hours as needed for mild pain. 06/07/23   Setzer, Nena JINNY, PA-C  atorvastatin  (LIPITOR ) 80 MG tablet Take 1 tablet (80 mg total) by mouth daily. Patient taking differently: Take 40 mg by mouth daily. 06/18/23   Paseda, Folashade R, FNP  brimonidine  (ALPHAGAN ) 0.2 % ophthalmic solution Place 1 drop into the right eye 2 (two) times daily. 02/02/24   Valdemar Rogue, MD  carvedilol  (COREG ) 6.25 MG tablet Take 1 tablet (6.25 mg total) by mouth 2 (two) times daily with a meal. 06/18/23   Paseda, Folashade R, FNP  ciprofloxacin  (CILOXAN ) 0.3 % ophthalmic solution Administer 2  drop into the right eye four times daily for  7 days 06/30/23   Paseda, Folashade R, FNP  clopidogrel  (PLAVIX ) 75 MG tablet TAKE 1 TABLET BY MOUTH EVERY DAY 09/10/23   Paseda, Folashade R, FNP  dorzolamide -timolol  (COSOPT ) 2-0.5 % ophthalmic solution Place 1 drop into the right eye 2 (two) times daily. 02/02/24   Valdemar Rogue, MD  insulin  glargine (LANTUS ) 100 UNIT/ML Solostar Pen Inject 15 Units into the skin daily. 11/29/23   Paseda, Folashade R, FNP   latanoprost  (XALATAN ) 0.005 % ophthalmic solution Place 1 drop into the right eye at bedtime. 05/29/24   Valdemar Rogue, MD  lisinopril  (ZESTRIL ) 2.5 MG tablet Take 2.5 mg by mouth daily.    [provider]  Multiple Vitamin (MULTIVITAMIN) capsule Take 1 capsule by mouth daily.    [provider]  ofloxacin (OCUFLOX) 0.3 % ophthalmic solution Place 1 drop into the left eye 4 (four) times daily. 09/10/23   [provider]  ondansetron  (ZOFRAN ) 4 MG tablet Take 1 tablet (4 mg total) by mouth every 6 (six) hours as needed for nausea. 06/07/23   Setzer, Sandra J, PA-C  oxyCODONE  (OXY IR/ROXICODONE ) 5 MG immediate release tablet Take 1 tablet (5 mg total) by mouth every 6 (six) hours as needed for severe pain. 09/23/23   Kinsinger, Herlene Righter, MD  Semaglutide (OZEMPIC, 0.25 OR 0.5 MG/DOSE, [place]) Inject 0.25 mg into the skin once a week.    [provider]  sevelamer  carbonate (RENVELA ) 800 MG tablet TAKE 1 TABLET BY MOUTH 3 TIMES DAILY WITH MEALS. 11/29/23   Paseda, Folashade R, FNP  sodium chloride  flush 0.9 % SOLN injection Flush cholecystectomy tube as directed by general surgery 06/18/23   Paseda, Folashade R, FNP    Social History   Socioeconomic History   Marital status: Widowed    Spouse name: Not on file   Number of children: Not on file   Years of education: Not on file   Highest education level: Not on file  Occupational History   Not on file  Tobacco Use   Smoking status: Never   Smokeless tobacco: Never  Vaping Use   Vaping status: Never Used  Substance and Sexual Activity   Alcohol  use: Not Currently    Alcohol /week: 1.0 - 2.0 standard drink of alcohol     Types: 1 - 2 Cans of beer per week    Comment: occas   Drug use: No   Sexual activity: Never  Other Topics Concern   Not on file  Social History Narrative   Not on file   Social Drivers of Health   Financial Resource Strain: Not on file  Food Insecurity: Food Insecurity Present  (11/06/2024)   Hunger Vital Sign    Worried About Running Out of Food in the Last Year: Sometimes true    Ran Out of Food in the Last Year: Sometimes true  Transportation Needs: No Transportation Needs (11/06/2024)   PRAPARE - Administrator, Civil Service (Medical): No    Lack of Transportation (Non-Medical): No  Physical Activity: Not on file  Stress: Not on file  Social Connections: Moderately Isolated (11/06/2024)   Social Connection and Isolation Panel    Frequency of Communication with Friends and Family: More than three times a week    Frequency of Social Gatherings with Friends and Family: Once a week    Attends Religious Services: 1 to 4 times per year    Active Member of [employer] or Organizations:  No    Attends Banker Meetings: Never    Marital Status: Widowed  Intimate Partner Violence: Not At Risk (11/06/2024)   Humiliation, Afraid, Rape, and Kick questionnaire    Fear of Current or Ex-Partner: No    Emotionally Abused: No    Physically Abused: No    Sexually Abused: No   Family History  Problem Relation Age of Onset   Hypertension Mother     ROS: Otherwise negative unless mentioned in HPI  Physical Examination  Vitals:   11/06/24 1607 11/06/24 1647  BP: (!) 167/61 (!) 194/58  Pulse: 67 66  Resp: 18 16  Temp: 98 F (36.7 C) 98.3 F (36.8 C)  SpO2: 98% 100%   There is no height or weight on file to calculate BMI.  General:  WDWN in NAD Gait: Not observed HENT: WNL, normocephalic Pulmonary: normal non-labored breathing, without Rales, rhonchi,  wheezing Cardiac: regular Abdomen:  soft, NT/ND, no masses Skin: without rashes Vascular Exam/Pulses: Palpable femorals, nonpalpable pedal's. Extremities: without ischemic changes, without Gangrene , with cellulitis; with open wounds;     Musculoskeletal: no muscle wasting or atrophy  Neurologic: A&O X 3;  No focal weakness or paresthesias are detected; speech is  fluent/normal Psychiatric:  The pt has Normal affect. Lymph:  Unremarkable  CBC    Component Value Date/Time   WBC 8.2 11/06/2024 1220   RBC 2.85 (L) 11/06/2024 1220   HGB 9.8 (L) 11/06/2024 1220   HCT 29.5 (L) 11/06/2024 1220   PLT 246 11/06/2024 1220   MCV 103.5 (H) 11/06/2024 1220   MCH 34.4 (H) 11/06/2024 1220   MCHC 33.2 11/06/2024 1220   RDW 12.4 11/06/2024 1220   LYMPHSABS 1.3 11/06/2024 1220   MONOABS 0.6 11/06/2024 1220   EOSABS 0.2 11/06/2024 1220   BASOSABS 0.1 11/06/2024 1220    BMET    Component Value Date/Time   NA 137 11/06/2024 1220   NA 140 04/12/2023 1702   K 3.7 11/06/2024 1220   CL 100 11/06/2024 1220   CO2 24 11/06/2024 1220   GLUCOSE 292 (H) 11/06/2024 1220   BUN 31 (H) 11/06/2024 1220   BUN 39 (H) 04/12/2023 1702   CREATININE 3.56 (H) 11/06/2024 1220   CREATININE 1.02 (H) 12/23/2016 0940   CALCIUM  9.7 11/06/2024 1220   CALCIUM  9.0 05/25/2023 0218   GFRNONAA 13 (L) 11/06/2024 1220   GFRNONAA 59 (L) 12/23/2016 0940   GFRAA 47 (L) 09/13/2018 1002   GFRAA 68 12/23/2016 0940    COAGS: Lab Results  Component Value Date   INR 1.1 05/17/2023   INR 1.0 11/08/2021   INR 0.91 05/16/2015      ASSESSMENT/PLAN: This is a 73 y.o. female with medical history outlined above.  She has left lower extremity critical limb ischemia with tissue loss at the toe.  Nonpalpable pulses in the foot.  ABIs pending.  Regardless, with her comorbidities and a nonpalpable pulse.  Where he would benefit from left lower extremity angiography in an effort to find improve distal perfusion for wound healing.  She is high risk for higher level amputation.  I have discussed the risks and benefits of the above, Samantha [name] elected to proceed.   Samantha FORBES Rim MD [facility] [phone] 11/06/2024  5:51 PM

## 2024-11-06 NOTE — ED Triage Notes (Signed)
 Pt BIB EMS with reports of a wound to her left 4th toe. Pt states that it started off as a blister and now it has worsened. Pt denies pain, pain only with pressure. Pt has a red streak going from the fourth toe up her foot.

## 2024-11-06 NOTE — ED Provider Notes (Signed)
 [place] EMERGENCY DEPARTMENT AT [facility] Provider Note   CSN: [id number] Arrival date & time: 11/06/24  1158     Patient presents with: Wound Check   Samantha [name] is a 73 y.o. female.   73 year old female with past medical history of end stage renal disease on dialysis as well as diabetes presenting to the emergency department today with concern for a wound on her left foot.  Patient dates she has been having wound on her left foot now for the past few days.  Reports she started noticing redness moving up the foot so she came to the ER today for further evaluation.  She states that she has been [place] generally unwell but has not checked her temperature.  Is not sure if she has had a fever or not.  She denies any nausea or vomiting.  Denies any missed dialysis.  Normally has dialysis Tuesdays, Thursdays, and Saturdays.  She came to the ER today further evaluation regarding this.   Wound Check       Prior to Admission medications   Medication Sig Start Date End Date Taking? Authorizing Provider  acetaminophen  (TYLENOL ) 325 MG tablet Take 1-2 tablets (325-650 mg total) by mouth every 4 (four) hours as needed for mild pain. 06/07/23   Setzer, Sandra J, PA-C  atorvastatin  (LIPITOR ) 80 MG tablet Take 1 tablet (80 mg total) by mouth daily. Patient taking differently: Take 40 mg by mouth daily. 06/18/23   Paseda, Folashade R, FNP  brimonidine  (ALPHAGAN ) 0.2 % ophthalmic solution Place 1 drop into the right eye 2 (two) times daily. 02/02/24   Valdemar Rogue, MD  carvedilol  (COREG ) 6.25 MG tablet Take 1 tablet (6.25 mg total) by mouth 2 (two) times daily with a meal. 06/18/23   Paseda, Folashade R, FNP  ciprofloxacin  (CILOXAN ) 0.3 % ophthalmic solution Administer 2 drop into the right eye four times daily for  7 days 06/30/23   Paseda, Folashade R, FNP  clopidogrel  (PLAVIX ) 75 MG tablet TAKE 1 TABLET BY MOUTH EVERY DAY 09/10/23   Paseda, Folashade R, FNP  dorzolamide -timolol   (COSOPT ) 2-0.5 % ophthalmic solution Place 1 drop into the right eye 2 (two) times daily. 02/02/24   Valdemar Rogue, MD  insulin  glargine (LANTUS ) 100 UNIT/ML Solostar Pen Inject 15 Units into the skin daily. 11/29/23   Paseda, Folashade R, FNP  latanoprost  (XALATAN ) 0.005 % ophthalmic solution Place 1 drop into the right eye at bedtime. 05/29/24   Valdemar Rogue, MD  lisinopril  (ZESTRIL ) 2.5 MG tablet Take 2.5 mg by mouth daily.    [provider]  Multiple Vitamin (MULTIVITAMIN) capsule Take 1 capsule by mouth daily.    [provider]  ofloxacin (OCUFLOX) 0.3 % ophthalmic solution Place 1 drop into the left eye 4 (four) times daily. 09/10/23   [provider]  ondansetron  (ZOFRAN ) 4 MG tablet Take 1 tablet (4 mg total) by mouth every 6 (six) hours as needed for nausea. 06/07/23   Setzer, Sandra J, PA-C  oxyCODONE  (OXY IR/ROXICODONE ) 5 MG immediate release tablet Take 1 tablet (5 mg total) by mouth every 6 (six) hours as needed for severe pain. 09/23/23   Kinsinger, Herlene Righter, MD  Semaglutide (OZEMPIC, 0.25 OR 0.5 MG/DOSE, [place]) Inject 0.25 mg into the skin once a week.    [provider]  sevelamer  carbonate (RENVELA ) 800 MG tablet TAKE 1 TABLET BY MOUTH 3 TIMES DAILY WITH MEALS. 11/29/23   Paseda, Folashade R, FNP  sodium chloride  flush 0.9 % SOLN  injection Flush cholecystectomy tube as directed by general surgery 06/18/23   Paseda, Folashade R, FNP    Allergies: Chicken allergy and Perflutren     Review of Systems  Skin:  Positive for wound.  All other systems reviewed and are negative.   Updated Vital Signs BP (!) 170/83 (BP Location: Right Arm)   Pulse 70   Temp 98.7 F (37.1 C) (Oral)   Resp 16   SpO2 100%   Physical Exam Vitals and nursing note reviewed.   Gen: NAD Eyes: PERRL, EOMI HEENT: no oropharyngeal swelling Neck: trachea midline Resp: clear to auscultation bilaterally Card: RRR, no murmurs, rubs, or gallops Abd: nontender,  nondistended Extremities: Ulceration with erythema noted of the left fourth toe with minimal drainage, there is some streaking noted up to the distal third of the tibia/fibula Vascular: 2+ radial pulses bilaterally, 2+ DP pulses bilaterally Skin: no rashes Psyc: acting appropriately   (all labs ordered are listed, but only abnormal results are displayed) Labs Reviewed  COMPREHENSIVE METABOLIC PANEL WITH GFR - Abnormal; Notable for the following components:      Result Value   Glucose, Bld 292 (*)    BUN 31 (*)    Creatinine, Ser 3.56 (*)    Alkaline Phosphatase 139 (*)    GFR, Estimated 13 (*)    All other components within normal limits  CBC WITH DIFFERENTIAL/PLATELET - Abnormal; Notable for the following components:   RBC 2.85 (*)    Hemoglobin 9.8 (*)    HCT 29.5 (*)    MCV 103.5 (*)    MCH 34.4 (*)    All other components within normal limits  CULTURE, BLOOD (ROUTINE X 2)  CULTURE, BLOOD (ROUTINE X 2)  I-STAT CG4 LACTIC ACID, ED  I-STAT CG4 LACTIC ACID, ED  I-STAT CG4 LACTIC ACID, ED    EKG: None  Radiology: DG Foot Complete Left Result Date: 11/06/2024 CLINICAL DATA:  Concern for osteomyelitis. EXAM: LEFT FOOT - COMPLETE 3+ VIEW COMPARISON:  Left foot radiograph dated 01/16/2016. FINDINGS: No acute fracture or dislocation. Mild osteopenia. No significant arthritic changes. No bone erosion or periosteal elevation. Vascular calcifications noted. The soft tissues are unremarkable IMPRESSION: 1. No acute fracture or dislocation. 2. No radiographic evidence of osteomyelitis. Electronically Signed   By: Vanetta Chou M.D.   On: 11/06/2024 13:43     Procedures   Medications Ordered in the ED  Ampicillin-Sulbactam (UNASYN) 3 g in sodium chloride  0.9 % 100 mL IVPB (3 g Intravenous New Bag/Given 11/06/24 1325)    And  vancomycin  (VANCOCIN ) IVPB 1000 mg/200 mL premix (has no administration in time range)                                    Medical Decision  Making 73 year old female with past medical history of diabetes and end-stage renal disease presenting to the emergency department today with concern for diabetic foot wound.  The patient does have some streaking up her leg here and does report some systemic symptoms.  I will further evaluate the patient here with basic labs as well as some blood cultures.  Will obtain x-ray to evaluate for bony abnormalities concerning for osteomyelitis.  I will cover the patient for moderate/severe diabetic foot infection.  She will require admission.  The patient's labs are reassuring and x-ray does not show any obvious osteomyelitis.  Calls placed to the hospitalist service for admission.  Amount and/or  Complexity of Data Reviewed Labs: ordered. Radiology: ordered.  Risk Prescription drug management.        Final diagnoses:  Diabetic foot ulcer associated with other specified diabetes mellitus, unspecified laterality, unspecified part of foot, unspecified ulcer stage [facility])    ED Discharge Orders     None          Ula Prentice SAUNDERS, MD 11/06/24 1359

## 2024-11-06 NOTE — Consult Note (Signed)
 Renal Service Consult Note [facility] Lamar JONETTA Fret, MD  Patient: Samantha [name] Date: 11/06/2024 Requesting Physician: Dr. Jadine, D.   Reason for Consult: ESRD pt w/ diab foot infection HPI: The patient is a 73 y.o. year-old w/ PMH as below who presented to ED c/o left foot/ toe pain and swelling, worsening recently. Red streak going up the foot. H/o DM type I, ESRD on HD TTS, h/o limb ischemia. In ED BP 180 61, HR 67, RR 15, tmep 98. 100% on RA. BUN 31, creat 3.5, K+ 3.7. WBC 8K, Hb 9.8. Pt was given IV unasyn and vancomycin . Being admitted at [facility]. We are asked to see for dialysis.    Pt seen in HD unit. Tolerating HD well. Spanish speaking, no resp issues, no leg swelling. Just foot pain.    ROS - denies CP, no joint pain, no HA, no blurry vision, no rash, no diarrhea, no nausea/ vomiting   Past Medical History  Past Medical History:  Diagnosis Date   Acute pyelonephritis    Bacteremia, escherichia coli 01/12/2015   Chest pain    Critical lower limb ischemia (HCC)    ESRD (end stage renal disease) (HCC)    Hyperlipidemia    Hypertension    Left carotid bruit    PAD (peripheral artery disease)    Stroke (HCC)    Type II diabetes mellitus (HCC)    Past Surgical History  Past Surgical History:  Procedure Laterality Date   ABDOMINAL ANGIOGRAM  05/16/2015   Procedure: Abdominal Angiogram;  Surgeon: Dorn JINNY Lesches, MD;  Location: [facility];  Service: Cardiovascular;;   ABDOMINAL HYSTERECTOMY  ~ 2000   CHOLECYSTECTOMY N/A 09/23/2023   Procedure: LAPAROSCOPIC CHOLECYSTECTOMY;  Surgeon: Stevie Herlene Righter, MD;  Location: [facility] OR;  Service: General;  Laterality: N/A;   IR EXCHANGE BILIARY DRAIN  07/26/2023   IR FLUORO GUIDE CV LINE RIGHT  05/17/2023   IR PERC CHOLECYSTOSTOMY  05/23/2023   IR US  GUIDE VASC ACCESS RIGHT  05/17/2023   PERIPHERAL VASCULAR CATHETERIZATION Bilateral 05/16/2015   Procedure: Lower Extremity Angiography;  Surgeon:  Dorn JINNY Lesches, MD; renal arteries widely patent, L-SFA 75%, L-pop 95%, L-peroneal 90%; R-SFA 40%, R-pop 50%, R-prox peroneal 90%; directional atherectomy L-SFA, p-pop, drug-eluting balloon angioplasty reducing the stenoses to 0, small linear dissection was not flow limiting       PERIPHERAL VASCULAR CATHETERIZATION Left 05/16/2015   Procedure: Peripheral Vascular Atherectomy;  Surgeon: Dorn JINNY Lesches, MD;  Location: [facility];  Service: Cardiovascular;  Laterality: Left;  sfa   Family History  Family History  Problem Relation Age of Onset   Hypertension Mother    Social History  reports that she has never smoked. She has never used smokeless tobacco. She reports that she does not currently use alcohol  after a past usage of about 1.0 - 2.0 standard drink of alcohol  per week. She reports that she does not use drugs. Allergies  Allergies  Allergen Reactions   Chicken Allergy Other (See Comments)    headache   Perflutren  Other (See Comments)    Pt with back pain as soon as administered.- patient refuted this in 2024   Home medications Prior to Admission medications   Medication Sig Start Date End Date Taking? Authorizing Provider  acetaminophen  (TYLENOL ) 325 MG tablet Take 1-2 tablets (325-650 mg total) by mouth every 4 (four) hours as needed for mild pain. 06/07/23   Setzer, Sandra J, PA-C  atorvastatin  (LIPITOR ) 80 MG  tablet Take 1 tablet (80 mg total) by mouth daily. Patient taking differently: Take 40 mg by mouth daily. 06/18/23   Paseda, Folashade R, FNP  brimonidine  (ALPHAGAN ) 0.2 % ophthalmic solution Place 1 drop into the right eye 2 (two) times daily. 02/02/24   Zamora, Brian, MD  carvedilol  (COREG ) 6.25 MG tablet Take 1 tablet (6.25 mg total) by mouth 2 (two) times daily with a meal. 06/18/23   Paseda, Folashade R, FNP  ciprofloxacin  (CILOXAN ) 0.3 % ophthalmic solution Administer 2 drop into the right eye four times daily for  7 days 06/30/23   Paseda, Folashade R, FNP   clopidogrel  (PLAVIX ) 75 MG tablet TAKE 1 TABLET BY MOUTH EVERY DAY 09/10/23   Paseda, Folashade R, FNP  dorzolamide -timolol  (COSOPT ) 2-0.5 % ophthalmic solution Place 1 drop into the right eye 2 (two) times daily. 02/02/24   Valdemar Rogue, MD  insulin  glargine (LANTUS ) 100 UNIT/ML Solostar Pen Inject 15 Units into the skin daily. 11/29/23   Paseda, Folashade R, FNP  latanoprost  (XALATAN ) 0.005 % ophthalmic solution Place 1 drop into the right eye at bedtime. 05/29/24   Valdemar Rogue, MD  lisinopril  (ZESTRIL ) 2.5 MG tablet Take 2.5 mg by mouth daily.    [provider]  Multiple Vitamin (MULTIVITAMIN) capsule Take 1 capsule by mouth daily.    [provider]  ofloxacin (OCUFLOX) 0.3 % ophthalmic solution Place 1 drop into the left eye 4 (four) times daily. 09/10/23   [provider]  ondansetron  (ZOFRAN ) 4 MG tablet Take 1 tablet (4 mg total) by mouth every 6 (six) hours as needed for nausea. 06/07/23   Setzer, Sandra J, PA-C  oxyCODONE  (OXY IR/ROXICODONE ) 5 MG immediate release tablet Take 1 tablet (5 mg total) by mouth every 6 (six) hours as needed for severe pain. 09/23/23   Kinsinger, Herlene Righter, MD  Semaglutide (OZEMPIC, 0.25 OR 0.5 MG/DOSE, [place]) Inject 0.25 mg into the skin once a week.    [provider]  sevelamer  carbonate (RENVELA ) 800 MG tablet TAKE 1 TABLET BY MOUTH 3 TIMES DAILY WITH MEALS. 11/29/23   Paseda, Folashade R, FNP  sodium chloride  flush 0.9 % SOLN injection Flush cholecystectomy tube as directed by general surgery 06/18/23   Paseda, Folashade R, FNP     Vitals:   11/06/24 1204 11/06/24 1503  BP: (!) 170/83 (!) 187/61  Pulse: 70 67  Resp: 16 14  Temp: 98.7 F (37.1 C) 98 F (36.7 C)  TempSrc: Oral Oral  SpO2: 100% 100%   Exam Gen alert, no distress Sclera anicteric, throat clear  No jvd or bruits Chest clear bilat to bases RRR no MRG Abd soft ntnd no mass or ascites +bs Ext no LE or UE edema, no other edema Neuro is alert, Ox 3 , nf     L AVF+bruit   Home bp meds: Coreg  6.25 bid Lisinopril  2.5 daily   OP HD: TTS [facility] HD Details to follow   Assessment/ Plan: L diabetic foot infection: per pmd, on IV abx PAD: hx of limb ischemia back in 2016 ESRD: on HD TTS. Plan HD today.  HTN: bp's stable, cont home meds  Volume: euvolemic on exam, no O2, on RA. UF 2-2.5 L.  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    Myer Fret  MD [facility] 11/06/2024, 3:35 PM  Recent Labs  Lab 11/06/24 1220  HGB 9.8*  ALBUMIN  4.0  CALCIUM  9.7  CREATININE 3.56*  K 3.7   Inpatient medications:  brimonidine   1 drop Right Eye BID   carvedilol   6.25 mg Oral BID WC   clopidogrel   75 mg Oral Daily   dorzolamide -timolol   1 drop Right Eye BID   [START ON 11/07/2024] enoxaparin  (LOVENOX ) injection  30 mg Subcutaneous Q24H   insulin  aspart  0-5 Units Subcutaneous QHS   insulin  aspart  0-6 Units Subcutaneous TID WC   insulin  glargine-yfgn  20 Units Subcutaneous QHS   latanoprost   1 drop Right Eye QHS   linezolid  600 mg Oral Q12H   lisinopril   2.5 mg Oral Daily   sevelamer  carbonate  800 mg Oral TID WC   sodium chloride  flush  3 mL Intravenous Q12H   sodium chloride  flush  3 mL Intravenous Q12H    sodium chloride      sodium chloride , acetaminophen  **OR** acetaminophen , ondansetron  **OR** ondansetron  (ZOFRAN ) IV, oxyCODONE , sodium chloride  flush

## 2024-11-06 NOTE — Progress Notes (Signed)
 Pt receives out-pt HD at [facility] on TTS with 11:50 am chair time. Clinic to fax pt's HD orders and med list to inpt HD unit per nephrologist request. Will assist as needed.   Randine Mungo Dialysis Navigator [phone]

## 2024-11-06 NOTE — ED Notes (Signed)
 [facility] called.

## 2024-11-07 ENCOUNTER — Observation Stay (HOSPITAL_COMMUNITY)

## 2024-11-07 DIAGNOSIS — L0390 Cellulitis, unspecified: Secondary | ICD-10-CM

## 2024-11-07 DIAGNOSIS — Z7902 Long term (current) use of antithrombotics/antiplatelets: Secondary | ICD-10-CM | POA: Diagnosis not present

## 2024-11-07 DIAGNOSIS — Z66 Do not resuscitate: Secondary | ICD-10-CM | POA: Diagnosis present

## 2024-11-07 DIAGNOSIS — Z95820 Peripheral vascular angioplasty status with implants and grafts: Secondary | ICD-10-CM | POA: Diagnosis not present

## 2024-11-07 DIAGNOSIS — F418 Other specified anxiety disorders: Secondary | ICD-10-CM | POA: Diagnosis not present

## 2024-11-07 DIAGNOSIS — E1140 Type 2 diabetes mellitus with diabetic neuropathy, unspecified: Secondary | ICD-10-CM | POA: Diagnosis present

## 2024-11-07 DIAGNOSIS — L089 Local infection of the skin and subcutaneous tissue, unspecified: Secondary | ICD-10-CM | POA: Diagnosis not present

## 2024-11-07 DIAGNOSIS — E11621 Type 2 diabetes mellitus with foot ulcer: Secondary | ICD-10-CM | POA: Diagnosis present

## 2024-11-07 DIAGNOSIS — I10 Essential (primary) hypertension: Secondary | ICD-10-CM | POA: Diagnosis not present

## 2024-11-07 DIAGNOSIS — E11628 Type 2 diabetes mellitus with other skin complications: Secondary | ICD-10-CM | POA: Diagnosis present

## 2024-11-07 DIAGNOSIS — L03116 Cellulitis of left lower limb: Secondary | ICD-10-CM | POA: Diagnosis not present

## 2024-11-07 DIAGNOSIS — E1151 Type 2 diabetes mellitus with diabetic peripheral angiopathy without gangrene: Secondary | ICD-10-CM | POA: Diagnosis present

## 2024-11-07 DIAGNOSIS — Z603 Acculturation difficulty: Secondary | ICD-10-CM | POA: Diagnosis present

## 2024-11-07 DIAGNOSIS — I7092 Chronic total occlusion of artery of the extremities: Secondary | ICD-10-CM | POA: Diagnosis not present

## 2024-11-07 DIAGNOSIS — I739 Peripheral vascular disease, unspecified: Secondary | ICD-10-CM

## 2024-11-07 DIAGNOSIS — N186 End stage renal disease: Secondary | ICD-10-CM | POA: Diagnosis present

## 2024-11-07 DIAGNOSIS — M869 Osteomyelitis, unspecified: Secondary | ICD-10-CM

## 2024-11-07 DIAGNOSIS — D539 Nutritional anemia, unspecified: Secondary | ICD-10-CM | POA: Diagnosis present

## 2024-11-07 DIAGNOSIS — I70222 Atherosclerosis of native arteries of extremities with rest pain, left leg: Secondary | ICD-10-CM | POA: Diagnosis not present

## 2024-11-07 DIAGNOSIS — Z992 Dependence on renal dialysis: Secondary | ICD-10-CM | POA: Diagnosis not present

## 2024-11-07 DIAGNOSIS — E785 Hyperlipidemia, unspecified: Secondary | ICD-10-CM | POA: Diagnosis present

## 2024-11-07 DIAGNOSIS — Z5941 Food insecurity: Secondary | ICD-10-CM | POA: Diagnosis not present

## 2024-11-07 DIAGNOSIS — I120 Hypertensive chronic kidney disease with stage 5 chronic kidney disease or end stage renal disease: Secondary | ICD-10-CM | POA: Diagnosis present

## 2024-11-07 DIAGNOSIS — E1165 Type 2 diabetes mellitus with hyperglycemia: Secondary | ICD-10-CM | POA: Diagnosis present

## 2024-11-07 DIAGNOSIS — Z8249 Family history of ischemic heart disease and other diseases of the circulatory system: Secondary | ICD-10-CM | POA: Diagnosis not present

## 2024-11-07 DIAGNOSIS — Z794 Long term (current) use of insulin: Secondary | ICD-10-CM | POA: Diagnosis not present

## 2024-11-07 DIAGNOSIS — D509 Iron deficiency anemia, unspecified: Secondary | ICD-10-CM | POA: Diagnosis present

## 2024-11-07 DIAGNOSIS — E1122 Type 2 diabetes mellitus with diabetic chronic kidney disease: Secondary | ICD-10-CM | POA: Diagnosis present

## 2024-11-07 DIAGNOSIS — L97529 Non-pressure chronic ulcer of other part of left foot with unspecified severity: Secondary | ICD-10-CM | POA: Diagnosis present

## 2024-11-07 DIAGNOSIS — D631 Anemia in chronic kidney disease: Secondary | ICD-10-CM | POA: Diagnosis present

## 2024-11-07 DIAGNOSIS — I70262 Atherosclerosis of native arteries of extremities with gangrene, left leg: Secondary | ICD-10-CM | POA: Diagnosis present

## 2024-11-07 DIAGNOSIS — Z7985 Long-term (current) use of injectable non-insulin antidiabetic drugs: Secondary | ICD-10-CM | POA: Diagnosis not present

## 2024-11-07 DIAGNOSIS — E1169 Type 2 diabetes mellitus with other specified complication: Secondary | ICD-10-CM | POA: Diagnosis not present

## 2024-11-07 DIAGNOSIS — I70245 Atherosclerosis of native arteries of left leg with ulceration of other part of foot: Secondary | ICD-10-CM | POA: Diagnosis not present

## 2024-11-07 DIAGNOSIS — H409 Unspecified glaucoma: Secondary | ICD-10-CM | POA: Diagnosis present

## 2024-11-07 LAB — CBC
HCT: 32.2 % — ABNORMAL LOW (ref 36.0–46.0)
HCT: 26.9 % — ABNORMAL LOW (ref 36.0–46.0)
Hemoglobin: 11.1 g/dL — ABNORMAL LOW (ref 12.0–15.0)
Hemoglobin: 9.6 g/dL — ABNORMAL LOW (ref 12.0–15.0)
MCH: 35.4 pg — ABNORMAL HIGH (ref 26.0–34.0)
MCH: 35.6 pg — ABNORMAL HIGH (ref 26.0–34.0)
MCHC: 34.5 g/dL (ref 30.0–36.0)
MCHC: 35.7 g/dL (ref 30.0–36.0)
MCV: 102.5 fL — ABNORMAL HIGH (ref 80.0–100.0)
MCV: 99.6 fL (ref 80.0–100.0)
Platelets: 241 K/uL (ref 150–400)
Platelets: 256 K/uL (ref 150–400)
RBC: 3.14 MIL/uL — ABNORMAL LOW (ref 3.87–5.11)
RBC: 2.7 MIL/uL — ABNORMAL LOW (ref 3.87–5.11)
RDW: 12.6 % (ref 11.5–15.5)
RDW: 12.4 % (ref 11.5–15.5)
WBC: 6.9 K/uL (ref 4.0–10.5)
WBC: 9.7 K/uL (ref 4.0–10.5)
nRBC: 0 % (ref 0.0–0.2)
nRBC: 0 % (ref 0.0–0.2)

## 2024-11-07 LAB — BASIC METABOLIC PANEL WITH GFR
Anion gap: 17 — ABNORMAL HIGH (ref 5–15)
BUN: 33 mg/dL — ABNORMAL HIGH (ref 8–23)
CO2: 18 mmol/L — ABNORMAL LOW (ref 22–32)
Calcium: 9.4 mg/dL (ref 8.9–10.3)
Chloride: 101 mmol/L (ref 98–111)
Creatinine, Ser: 3.73 mg/dL — ABNORMAL HIGH (ref 0.44–1.00)
GFR, Estimated: 12 mL/min — ABNORMAL LOW (ref >60)
Glucose, Bld: 243 mg/dL — ABNORMAL HIGH (ref 70–99)
Potassium: 4.4 mmol/L (ref 3.5–5.1)
Sodium: 136 mmol/L (ref 135–145)

## 2024-11-07 LAB — GLUCOSE, CAPILLARY
Glucose-Capillary: 183 mg/dL — ABNORMAL HIGH (ref 70–99)
Glucose-Capillary: 223 mg/dL — ABNORMAL HIGH (ref 70–99)
Glucose-Capillary: 351 mg/dL — ABNORMAL HIGH (ref 70–99)

## 2024-11-07 MED ORDER — HYDRALAZINE HCL 20 MG/ML IJ SOLN: 10.0000 mg | INTRAMUSCULAR | Status: DC | PRN

## 2024-11-07 MED ORDER — LIDOCAINE-PRILOCAINE 2.5-2.5 % EX CREA: 1.0000 | TOPICAL_CREAM | CUTANEOUS | Status: DC | PRN

## 2024-11-07 MED ORDER — PENTAFLUOROPROP-TETRAFLUOROETH EX AERO
1.0000 | INHALATION_SPRAY | CUTANEOUS | Status: DC | PRN
Start: 2024-11-07 — End: 2024-11-07

## 2024-11-07 MED ORDER — ANTICOAGULANT SODIUM CITRATE 4% (200MG/5ML) IV SOLN: 5.0000 mL | Status: DC | PRN

## 2024-11-07 MED ORDER — HEPARIN SODIUM (PORCINE) 1000 UNIT/ML DIALYSIS: 1500.0000 [IU] | INTRAMUSCULAR | Status: DC | PRN

## 2024-11-07 MED ORDER — HEPARIN SODIUM (PORCINE) 1000 UNIT/ML IJ SOLN
INTRAMUSCULAR | Status: AC
Start: 2024-11-07 — End: 2024-11-07
  Filled 2024-11-07: qty 3

## 2024-11-07 MED ORDER — NEPRO/CARBSTEADY PO LIQD: 237.0000 mL | ORAL | Status: DC | PRN

## 2024-11-07 MED ORDER — BISACODYL 5 MG PO TBEC
10.0000 mg | DELAYED_RELEASE_TABLET | Freq: Every day | ORAL | Status: DC
  Administered 2024-11-07: 10 mg via ORAL
  Filled 2024-11-07: qty 2

## 2024-11-07 MED ORDER — LIVING WELL WITH DIABETES BOOK - IN SPANISH
Freq: Once | Status: DC
  Filled 2024-11-07: qty 1

## 2024-11-07 MED ORDER — GLUCAGON HCL RDNA (DIAGNOSTIC) 1 MG IJ SOLR: 1.0000 mg | INTRAMUSCULAR | Status: DC | PRN

## 2024-11-07 MED ORDER — LIDOCAINE HCL (PF) 1 % IJ SOLN: 5.0000 mL | INTRAMUSCULAR | Status: DC | PRN

## 2024-11-07 MED ORDER — HEPARIN SODIUM (PORCINE) 1000 UNIT/ML DIALYSIS
2500.0000 [IU] | Freq: Once | INTRAMUSCULAR | Status: AC
  Administered 2024-11-07: 2500 [IU] via INTRAVENOUS_CENTRAL

## 2024-11-07 MED ORDER — HEPARIN SODIUM (PORCINE) 1000 UNIT/ML DIALYSIS: 1000.0000 [IU] | INTRAMUSCULAR | Status: DC | PRN

## 2024-11-07 MED ORDER — IPRATROPIUM-ALBUTEROL 0.5-2.5 (3) MG/3ML IN SOLN
3.0000 mL | RESPIRATORY_TRACT | Status: DC | PRN
Start: 2024-11-07 — End: 2024-11-11

## 2024-11-07 MED ORDER — ONDANSETRON HCL 4 MG/2ML IJ SOLN
INTRAMUSCULAR | Status: AC
Start: 2024-11-07 — End: 2024-11-07
  Filled 2024-11-07: qty 2

## 2024-11-07 MED ORDER — METOPROLOL TARTRATE 5 MG/5ML IV SOLN: 5.0000 mg | INTRAVENOUS | Status: DC | PRN

## 2024-11-07 MED ORDER — ALTEPLASE 2 MG IJ SOLR: 2.0000 mg | Freq: Once | INTRAMUSCULAR | Status: DC | PRN

## 2024-11-07 NOTE — Progress Notes (Addendum)
 Saw patient suddenly opened her eyes,then rolled up her eye balls,then closed.Patient's facial skin is very pale looking,cold clammy skin .SABRASbp was 62/44,HR of 47 beats per minute,RR of 12 per minute ,84% on 2LPM/[place],called patient by her name ,she was not responding.This clinical research associate recycled blood pressure monitor,stopped hd treatment,someone moved bed that placed her on trendelenburg position,gave normal saline from hd machine,as well  rinsed blood back to her and applied sternal rub,patient moaned.Failed to rinse back blood to her.Recycled SBP was 103.Patient was more responsive at this time.CBG result was 155. Renal Pa was at the bedside,aware of new SBP,she ordered 300 cc NS bolus,that was given through her hd venous access( blood clots was noted when disconnecting the hd circuit from her venous access.Renal Pa was aware of 300 cc wasted blood due to failed rinse back.Inserted a new PIV,the current piv was not working.Post  normal saline bolus blood pressure was 141/75. Patient was transported to [facility] with this nurse with the monitor with stable vitals.Patient is arousable and verbally responsive.

## 2024-11-07 NOTE — Progress Notes (Signed)
 Initial Nutrition Assessment  DOCUMENTATION CODES:   Not applicable  INTERVENTION:  Resume carb modified diet post procedure Nepro Shake po BID, each supplement provides 425 kcal and 19 grams protein -1 packet Juven BID, each packet provides 95 calories, 2.5 grams of protein (collagen), and 9.8 grams of carbohydrate (3 grams sugar); also contains 7 grams of L-arginine and L-glutamine, 300 mg vitamin C, 15 mg vitamin E, 1.2 mcg vitamin B-12, 9.5 mg zinc, 200 mg calcium , and 1.5 g  Calcium  Beta-hydroxy-Beta-methylbutyrate to support wound healing Start Renal MVI  Continue Renvela     NUTRITION DIAGNOSIS:   Increased nutrient needs related to wound healing as evidenced by  (diabtetic foot ulcer).   GOAL:   Patient will meet greater than or equal to 90% of their needs   MONITOR:   PO intake, Supplement acceptance  REASON FOR ASSESSMENT:   Consult Wound healing  ASSESSMENT:   PMH including ESRD TTS, diabetes, critical lower limb ischemia, status post angiography 2016 with arthrectomy, presented with several day history of left toe pain.  Pt currently off unit at HD. NPO today for Lower extremity angiogram this afternoon with vascular surgery. A1c 9.1 on admission. No PO intake documented. Nepro shake ordered BID prn, will adjust to scheduled BID.   Admit weight: 62.2 kg  Current weight: 65 kg - pre HD  Nutritionally Relevant Medications:  SSI 0-6 units TID,  SSI 0-5 units daily, SEMGLEE , Renvela    Labs Reviewed: Glu 243, BUN 33, Cr 3.73, GFR 12, A1c 9.1, CBG 243-292    NUTRITION - FOCUSED PHYSICAL EXAM:  Unable to assess   Diet Order:   Diet Order             Diet NPO time specified  Diet effective midnight                   EDUCATION NEEDS:   No education needs have been identified at this time  Skin:  Skin Assessment: Skin Integrity Issues: Skin Integrity Issues:: Diabetic Ulcer Diabetic Ulcer: left foot  Last BM:  11/11  Height:   Ht Readings  from Last 1 Encounters:  11/06/24 5' 4 (1.626 m)    Weight:   Wt Readings from Last 1 Encounters:  11/07/24 65 kg    BMI:  Body mass index is 24.6 kg/m.  Estimated Nutritional Needs:   Kcal:  8049-7724  Protein:  78-98 g  Fluid:  1L + UOP  Maleeyah Mccaughey, MS, RD, LDN Clinical Dietitian  Contact via secure chat. If unavailable, use group chat RD Inpatient.

## 2024-11-07 NOTE — Consult Note (Signed)
 PODIATRY CONSULTATION  NAME Samantha [name] MRN [id number] DOB [birth year] DOA 11/06/2024   Reason for consult:  Chief Complaint  Patient presents with   Wound Check    Attending/Consulting physician: A. Amin MD  History of present illness: 73 year old with history of ESRD TTS, DM 2, critical lower limb ischemia status post angio 2016 with arthrectomy presented to the ED with left toe pain. Workup concerning for diabetic foot infection therefore admitted. Vascular team has been consulted.   Discussed with patient re mri findings concern for bone infection and recommendation for amputation of the fourth toe via video interpreter. She is hesitant to proceed but I explained risks associated with osteomyelitis spreading or making her sick. Discussed risks and benefits and she is willing to proceed with toe amputation. Discussed I will discuss further with her family.   Past Medical History:  Diagnosis Date   Acute pyelonephritis    Bacteremia, escherichia coli 01/12/2015   Chest pain    Critical lower limb ischemia (HCC)    ESRD (end stage renal disease) (HCC)    Hyperlipidemia    Hypertension    Left carotid bruit    PAD (peripheral artery disease)    Stroke (HCC)    Type II diabetes mellitus (HCC)        Latest Ref Rng & Units 11/07/2024    2:39 AM 11/06/2024   12:20 PM 09/23/2023   10:40 AM  CBC  WBC 4.0 - 10.5 K/uL 6.9  8.2    Hemoglobin 12.0 - 15.0 g/dL 88.8  9.8  87.0   Hematocrit 36.0 - 46.0 % 32.2  29.5  38.0   Platelets 150 - 400 K/uL 241  246         Latest Ref Rng & Units 11/07/2024    2:39 AM 11/06/2024   12:20 PM 09/23/2023   10:40 AM  BMP  Glucose 70 - 99 mg/dL 756  707  873   BUN 8 - 23 mg/dL 33  31  27   Creatinine 0.44 - 1.00 mg/dL 6.26  6.43  7.79   Sodium 135 - 145 mmol/L 136  137  135   Potassium 3.5 - 5.1 mmol/L 4.4  3.7  4.6   Chloride 98 - 111 mmol/L 101  100  110   CO2 22 - 32 mmol/L 18  24    Calcium  8.9 - 10.3 mg/dL 9.4  9.7         Physical Exam: Lower Extremity Exam  Non palpable DP and PT pulses left foot  Ulceration distal aspect of left fourth toe with mild erythema and edema  Decreased streaking erythema up foot  Pain on palpation of left fourth toe     ASSESSMENT/PLAN OF CARE 73 y.o. female with PMHx significant for  ESRD TTS, DM 2, critical lower limb ischemia status post angio 2016 with arthrectomy  with ulceration and osteomyelitis distal phalanx left fourth toe  CRP 1.4 ESR 20 A1c: 9.1 MRI L foot: 1. Diffuse marrow edema signal in the distal phalanx fourth toe suspicious for early osteomyelitis. 2. Trace edema signal in the tuft of the distal phalanx third toe is nonspecific. 3. Low-grade subcutaneous edema and overlying cutaneous thickening in the fourth toe and to a lesser extent in the second and third  - Recommend fourth toe amputation left foot given OM on MRI. She is willing to proceed. Plan for tomorrow or Thursday pending OR schedule. NPO mn prior. Discussed with her daughter as well - Continue IV  abx broad spectrum pending further culture data - Anticoagulation: ok to continue per vascular recs - Wound care: betadine paint pre op - WB status: wbat  - Will continue to follow   Thank you for the consult.  Please contact me directly with any questions or concerns.           [name], DPM [facility]    [address] [place], [place] [address]                Office [phone]  Fax [phone]

## 2024-11-07 NOTE — H&P (View-Only) (Signed)
  Daily Progress Note  CLI of the left lower extremity with tissue loss  Subjective: Doing this morning, no complaints, on the phone with her daughter  Objective: Vitals:   11/07/24 0846 11/07/24 0900  BP: (!) 188/60 (!) 202/62  Pulse: 61 63  Resp: 12 (!) 28  Temp:    SpO2: 100% 100%    Physical Examination Nonpalpable pedal pulses, palpable femoral pulses Nonlabored breathing Regular rate Cellulitis improved  ASSESSMENT/PLAN:  Patient is a 73 year old female with left lower extremity critical and ischemic tissue loss.  She is in need of left lower extremity angiogram in effort to define improve distal perfusion for wound healing.  We discussed this with her and discussed it with her daughter.  After discussing risks and benefits, Earnie elected to proceed.   Fonda FORBES Rim MD MS [facility] [phone] 11/07/2024  9:18 AM

## 2024-11-07 NOTE — Progress Notes (Signed)
  Daily Progress Note  CLI of the left lower extremity with tissue loss  Subjective: Doing this morning, no complaints, on the phone with her daughter  Objective: Vitals:   11/07/24 0846 11/07/24 0900  BP: (!) 188/60 (!) 202/62  Pulse: 61 63  Resp: 12 (!) 28  Temp:    SpO2: 100% 100%    Physical Examination Nonpalpable pedal pulses, palpable femoral pulses Nonlabored breathing Regular rate Cellulitis improved  ASSESSMENT/PLAN:  Patient is a 73 year old female with left lower extremity critical and ischemic tissue loss.  She is in need of left lower extremity angiogram in effort to define improve distal perfusion for wound healing.  We discussed this with her and discussed it with her daughter.  After discussing risks and benefits, Earnie elected to proceed.   Fonda FORBES Rim MD MS [facility] [phone] 11/07/2024  9:18 AM

## 2024-11-07 NOTE — Interval H&P Note (Signed)
 History and Physical Interval Note:  11/07/2024 1:57 PM  Samantha [name]  has presented today for surgery, with the diagnosis of ischemic /tissue loss.  The various methods of treatment have been discussed with the patient and family. After consideration of risks, benefits and other options for treatment, the patient has consented to  Procedure(s): Lower Extremity Angiography (Left) as a surgical intervention.  The patient's history has been reviewed, patient examined, no change in status, stable for surgery.  I have reviewed the patient's chart and labs.  Questions were answered to the patient's satisfaction.     Malvina New

## 2024-11-07 NOTE — Procedures (Addendum)
 S: pt seen in HD unit, tolerating 2-2.5 L UF goal well. Awaiting HD orders from her OP unit at [facility].   Vitals:   11/07/24 1130 11/07/24 1145 11/07/24 1200 11/07/24 1210  BP: (!) 146/61 90/76 (!) 140/59 (!) 62/44  Pulse: 75 78 83 (!) 47  Resp: 15 17 18 16   Temp:      TempSrc:      SpO2: 100% 97% 100% (!) 84%  Weight:      Height:        Recent Labs  Lab 11/06/24 1220 11/07/24 0239  HGB 9.8* 11.1*  ALBUMIN  4.0  --   CALCIUM  9.7 9.4  CREATININE 3.56* 3.73*  K 3.7 4.4    Inpatient medications:  brimonidine   1 drop Right Eye BID   carvedilol   6.25 mg Oral BID WC   Chlorhexidine  Gluconate Cloth  6 each Topical Q0600   clopidogrel   75 mg Oral Daily   dorzolamide -timolol   1 drop Right Eye BID   enoxaparin  (LOVENOX ) injection  30 mg Subcutaneous Q24H   insulin  aspart  0-5 Units Subcutaneous QHS   insulin  aspart  0-6 Units Subcutaneous TID WC   insulin  glargine-yfgn  20 Units Subcutaneous QHS   latanoprost   1 drop Right Eye QHS   linezolid  600 mg Oral Q12H   lisinopril   2.5 mg Oral Daily   living well with diabetes book- in spanish   Does not apply Once   sevelamer  carbonate  800 mg Oral TID WC   sodium chloride  flush  3 mL Intravenous Q12H   sodium chloride  flush  3 mL Intravenous Q12H    sodium chloride      ampicillin-sulbactam (UNASYN) IV 3 g (11/06/24 2126)   anticoagulant sodium citrate      sodium chloride , acetaminophen  **OR** acetaminophen , alteplase , anticoagulant sodium citrate , feeding supplement (NEPRO CARB STEADY), glucagon (human recombinant), heparin , [START ON 11/08/2024] heparin , hydrALAZINE , ipratropium-albuterol , lidocaine  (PF), lidocaine -prilocaine , metoprolol  tartrate, ondansetron  **OR** ondansetron  (ZOFRAN ) IV, oxyCODONE , pentafluoroprop-tetrafluoroeth, sodium chloride  flush  I was present at the procedure, reviewed the HD regimen and made appropriate changes.   Myer Fret MD  [facility] 11/07/2024, 1:31 PM

## 2024-11-07 NOTE — Progress Notes (Signed)
 PROGRESS NOTE    Samantha [name]  FMW:[id number] DOB: [birth year] DOA: 11/06/2024 PCP: Cloria Annabella CROME, DO    Brief Narrative:   73 year old with history of ESRD TTS, DM 2, critical lower limb ischemia status post angio 2016 with arthrectomy presented to the ED with left toe pain.  Workup concerning for diabetic foot infection therefore admitted.  Vascular team has been consulted.  Assessment & Plan:  Left lower lobe critical ischemia with tissue loss left toe osteomyelitis Peripheral arterial disease with history of critical limb ischemia 2016 - Nonpalpable pulses.  MRI Foot consistent with osteomyelitis. LE Angio today - Currently on empiric Unasyn and linezolid.  Depending on vascular plans with revascularization versus amputation, will involve infectious disease - On Plavix   Diabetes mellitus type 2 uncontrolled hyperglycemia - A1c 9.1. Long-acting and sliding scale.  Adjust as necessary  ESRD on HD -Nephrology consulted  Essential hypertension - Coreg , lisinopril   Microcytic anemia - Hb 11.1; stable.   DVT prophylaxis: enoxaparin  (LOVENOX ) injection 30 mg Start: 11/07/24 1000      Code Status: Do not attempt resuscitation (DNR) PRE-ARREST INTERVENTIONS DESIRED Family Communication:     PT Follow up Recs:   Subjective: Interpreter ID [id number]  No new complaints at this time.  Awaiting her procedure later today   Examination:  General exam: Appears calm and comfortable  Respiratory system: Clear to auscultation. Respiratory effort normal. Cardiovascular system: S1 & S2 heard, RRR. No JVD, murmurs, rubs, gallops or clicks. No pedal edema. Gastrointestinal system: Abdomen is nondistended, soft and nontender. No organomegaly or masses felt. Normal bowel sounds heard. Central nervous system: Alert and oriented. No focal neurological deficits. Extremities: Symmetric 5 x 5 power. Skin: Toe erythema noted Psychiatry: Judgement and insight appear normal. Mood &  affect appropriate.                Diet Orders (From admission, onward)     Start     Ordered   11/07/24 0001  Diet NPO time specified  Diet effective midnight        11/06/24 1822            Objective: Vitals:   11/07/24 0846 11/07/24 0900 11/07/24 0915 11/07/24 0930  BP: (!) 188/60 (!) 202/62 (!) 196/62 (!) 169/65  Pulse: 61 63 66 65  Resp: 12 (!) 28 17 12   Temp:      TempSrc:      SpO2: 100% 100% 100% 100%  Weight:      Height:        Intake/Output Summary (Last 24 hours) at 11/07/2024 1012 Last data filed at 11/06/2024 2130 Gross per 24 hour  Intake 516.86 ml  Output --  Net 516.86 ml   Filed Weights   11/06/24 1817 11/07/24 0821  Weight: 62.2 kg 65 kg    Scheduled Meds:  brimonidine   1 drop Right Eye BID   carvedilol   6.25 mg Oral BID WC   Chlorhexidine  Gluconate Cloth  6 each Topical Q0600   clopidogrel   75 mg Oral Daily   dorzolamide -timolol   1 drop Right Eye BID   enoxaparin  (LOVENOX ) injection  30 mg Subcutaneous Q24H   insulin  aspart  0-5 Units Subcutaneous QHS   insulin  aspart  0-6 Units Subcutaneous TID WC   insulin  glargine-yfgn  20 Units Subcutaneous QHS   latanoprost   1 drop Right Eye QHS   linezolid  600 mg Oral Q12H   lisinopril   2.5 mg Oral Daily   sevelamer  carbonate  800 mg Oral  TID WC   sodium chloride  flush  3 mL Intravenous Q12H   sodium chloride  flush  3 mL Intravenous Q12H   Continuous Infusions:  sodium chloride      ampicillin-sulbactam (UNASYN) IV 3 g (11/06/24 2126)   anticoagulant sodium citrate       Nutritional status     Body mass index is 24.6 kg/m.  Data Reviewed:   CBC: Recent Labs  Lab 11/06/24 1220 11/07/24 0239  WBC 8.2 6.9  NEUTROABS 6.1  --   HGB 9.8* 11.1*  HCT 29.5* 32.2*  MCV 103.5* 102.5*  PLT 246 241   Basic Metabolic Panel: Recent Labs  Lab 11/06/24 1220 11/07/24 0239  NA 137 136  K 3.7 4.4  CL 100 101  CO2 24 18*  GLUCOSE 292* 243*  BUN 31* 33*  CREATININE 3.56* 3.73*   CALCIUM  9.7 9.4   GFR: Estimated Creatinine Clearance: 11.9 mL/min (A) (by C-G formula based on SCr of 3.73 mg/dL (H)). Liver Function Tests: Recent Labs  Lab 11/06/24 1220  AST 19  ALT 11  ALKPHOS 139*  BILITOT 0.5  PROT 7.2  ALBUMIN  4.0   No results for input(s): LIPASE, AMYLASE in the last 168 hours. No results for input(s): AMMONIA in the last 168 hours. Coagulation Profile: No results for input(s): INR, PROTIME in the last 168 hours. Cardiac Enzymes: No results for input(s): CKTOTAL, CKMB, CKMBINDEX, TROPONINI in the last 168 hours. BNP (last 3 results) No results for input(s): PROBNP in the last 8760 hours. HbA1C: Recent Labs    11/06/24 1713  HGBA1C 9.1*   CBG: Recent Labs  Lab 11/06/24 1650 11/06/24 1751 11/06/24 2014  GLUCAP 280* 262* 203*   Lipid Profile: No results for input(s): CHOL, HDL, LDLCALC, TRIG, CHOLHDL, LDLDIRECT in the last 72 hours. Thyroid Function Tests: No results for input(s): TSH, T4TOTAL, FREET4, T3FREE, THYROIDAB in the last 72 hours. Anemia Panel: No results for input(s): VITAMINB12, FOLATE, FERRITIN, TIBC, IRON, RETICCTPCT in the last 72 hours. Sepsis Labs: Recent Labs  Lab 11/06/24 1226 11/06/24 1320 11/06/24 1418  LATICACIDVEN 1.0 1.6 1.5    Recent Results (from the past 240 hours)  Blood culture (routine x 2)     Status: None (Preliminary result)   Collection Time: 11/06/24 12:50 PM   Specimen: BLOOD RIGHT ARM  Result Value Ref Range Status   Specimen Description   Final    BLOOD RIGHT ARM Performed at [facility], [address], [place], [place] [address]    Special Requests   Final    BOTTLES DRAWN AEROBIC AND ANAEROBIC Blood Culture results may not be optimal due to an inadequate volume of blood received in culture bottles Performed at [facility], [address] [place], [place] [address]    Culture   Final    NO GROWTH < 24  HOURS Performed at [facility], [address] [place], [place] [address]    Report Status PENDING  Incomplete  Blood culture (routine x 2)     Status: None (Preliminary result)   Collection Time: 11/06/24  1:00 PM   Specimen: BLOOD RIGHT HAND  Result Value Ref Range Status   Specimen Description   Final    BLOOD RIGHT HAND Performed at [facility], [address], [place], [place] [address]    Special Requests   Final    BOTTLES DRAWN AEROBIC AND ANAEROBIC Blood Culture results may not be optimal due to an inadequate volume of  blood received in culture bottles Performed at [facility], [address] [place], [place] [address]    Culture   Final    NO GROWTH < 24 HOURS Performed at [facility], [address] [place], [place] [address]    Report Status PENDING  Incomplete         Radiology Studies: MR FOOT LEFT WO CONTRAST Result Date: 11/07/2024 CLINICAL DATA:  Diabetic foot swelling, clinical concern for osteomyelitis EXAM: MRI OF THE LEFT FOOT WITHOUT CONTRAST TECHNIQUE: Multiplanar, multisequence MR imaging of the left foot from the midfoot through the toes was performed. No intravenous contrast was administered. COMPARISON:  Radiographs 11/06/2024 FINDINGS: Bones/Joint/Cartilage Diffuse marrow edema signal in the distal phalanx fourth toe suspicious for early osteomyelitis. Trace edema signal in the tuft of the distal phalanx third toe is nonspecific. Ligaments Lisfranc ligament intact. Muscles and Tendons Low-level regional muscular edema is likely neurogenic. Nodular thickening of the plantar fascia on image 13 series 17 suspicious for plantar fibromatosis. Soft tissues Low-grade subcutaneous edema and overlying cutaneous thickening in the fourth toe and to a lesser extent in the second and third toes. Dorsal subcutaneous edema in the forefoot laterally. No drainable abscess observed. IMPRESSION: 1. Diffuse marrow edema signal in the  distal phalanx fourth toe suspicious for early osteomyelitis. 2. Trace edema signal in the tuft of the distal phalanx third toe is nonspecific. 3. Low-grade subcutaneous edema and overlying cutaneous thickening in the fourth toe and to a lesser extent in the second and third toes. 4. Nodular thickening of the plantar fascia suspicious for plantar fibromatosis. 5. Low-level regional muscular edema is likely neurogenic. Electronically Signed   By: Ryan Salvage M.D.   On: 11/07/2024 08:18   DG Foot Complete Left Result Date: 11/06/2024 CLINICAL DATA:  Concern for osteomyelitis. EXAM: LEFT FOOT - COMPLETE 3+ VIEW COMPARISON:  Left foot radiograph dated 01/16/2016. FINDINGS: No acute fracture or dislocation. Mild osteopenia. No significant arthritic changes. No bone erosion or periosteal elevation. Vascular calcifications noted. The soft tissues are unremarkable IMPRESSION: 1. No acute fracture or dislocation. 2. No radiographic evidence of osteomyelitis. Electronically Signed   By: Vanetta Chou M.D.   On: 11/06/2024 13:43           LOS: 0 days   Time spent= 35 mins    Burgess JAYSON Dare, MD [facility]  If 7PM-7AM, please contact night-coverage  11/07/2024, 10:12 AM

## 2024-11-07 NOTE — Plan of Care (Signed)

## 2024-11-07 NOTE — Progress Notes (Incomplete)
 Pt. Came in on bed, assisted by porter.  Consent signed and on file. Pt. Started with no complaints  UF goal: Tx duration: 3.5 hours  Access: Right AVF Needle used: 16g Access issue: None  During tx: Pt. Complained of nausea and vomiting with saliva. See MAR and flowsheet note.   Pt. Completed and tolerated tx Pressure dressing applied Endorsed to floor nurse. Transported to room.  Naryiah Schley Rubi Makalia Bare, RN [facility]

## 2024-11-07 NOTE — Hospital Course (Addendum)
 Brief Narrative:   73 year old with history of ESRD TTS, DM 2, critical lower limb ischemia status post angio 2016 with arthrectomy presented to the ED with left toe pain.  Workup concerning for diabetic foot infection therefore admitted.  Vascular team has been consulted.  Assessment & Plan:  Left lower lobe critical ischemia with tissue loss left toe osteomyelitis Peripheral arterial disease with history of critical limb ischemia 2016 - Nonpalpable pulses.  MRI Foot consistent with osteomyelitis. LE Angio today - Currently on empiric Unasyn and linezolid.  Depending on vascular plans with revascularization versus amputation, will involve infectious disease - On Plavix   Diabetes mellitus type 2 uncontrolled hyperglycemia - A1c 9.1. Long-acting and sliding scale.  Adjust as necessary  ESRD on HD -Nephrology consulted  Essential hypertension - Coreg , lisinopril   Microcytic anemia - Hb 11.1; stable.   DVT prophylaxis: enoxaparin  (LOVENOX ) injection 30 mg Start: 11/07/24 1000      Code Status: Do not attempt resuscitation (DNR) PRE-ARREST INTERVENTIONS DESIRED Family Communication:     PT Follow up Recs:   Subjective: Interpreter ID [id number]  No new complaints at this time.  Awaiting her procedure later today   Examination:  General exam: Appears calm and comfortable  Respiratory system: Clear to auscultation. Respiratory effort normal. Cardiovascular system: S1 & S2 heard, RRR. No JVD, murmurs, rubs, gallops or clicks. No pedal edema. Gastrointestinal system: Abdomen is nondistended, soft and nontender. No organomegaly or masses felt. Normal bowel sounds heard. Central nervous system: Alert and oriented. No focal neurological deficits. Extremities: Symmetric 5 x 5 power. Skin: Toe erythema noted Psychiatry: Judgement and insight appear normal. Mood & affect appropriate.

## 2024-11-08 ENCOUNTER — Encounter (HOSPITAL_COMMUNITY): Admission: EM | Disposition: A | Payer: Self-pay | Source: Ambulatory Visit | Attending: Family Medicine

## 2024-11-08 DIAGNOSIS — E11628 Type 2 diabetes mellitus with other skin complications: Secondary | ICD-10-CM | POA: Diagnosis not present

## 2024-11-08 DIAGNOSIS — I70222 Atherosclerosis of native arteries of extremities with rest pain, left leg: Secondary | ICD-10-CM | POA: Diagnosis not present

## 2024-11-08 DIAGNOSIS — L089 Local infection of the skin and subcutaneous tissue, unspecified: Secondary | ICD-10-CM | POA: Diagnosis not present

## 2024-11-08 LAB — BASIC METABOLIC PANEL WITH GFR
Anion gap: 15 (ref 5–15)
BUN: 22 mg/dL (ref 8–23)
CO2: 24 mmol/L (ref 22–32)
Calcium: 8.6 mg/dL — ABNORMAL LOW (ref 8.9–10.3)
Chloride: 96 mmol/L — ABNORMAL LOW (ref 98–111)
Creatinine, Ser: 3.7 mg/dL — ABNORMAL HIGH (ref 0.44–1.00)
GFR, Estimated: 13 mL/min — ABNORMAL LOW (ref >60)
Glucose, Bld: 178 mg/dL — ABNORMAL HIGH (ref 70–99)
Potassium: 4.1 mmol/L (ref 3.5–5.1)
Sodium: 135 mmol/L (ref 135–145)

## 2024-11-08 LAB — HEPATITIS B SURFACE ANTIBODY, QUANTITATIVE: Hep B S AB Quant (Post): <3.5 m[IU]/mL — ABNORMAL LOW

## 2024-11-08 LAB — MAGNESIUM: Magnesium: 2 mg/dL (ref 1.7–2.4)

## 2024-11-08 LAB — GLUCOSE, CAPILLARY
Glucose-Capillary: 191 mg/dL — ABNORMAL HIGH (ref 70–99)
Glucose-Capillary: 244 mg/dL — ABNORMAL HIGH (ref 70–99)
Glucose-Capillary: 293 mg/dL — ABNORMAL HIGH (ref 70–99)
Glucose-Capillary: 226 mg/dL — ABNORMAL HIGH (ref 70–99)

## 2024-11-08 LAB — VAS US ABI WITH/WO TBI
Left ABI: 0.97
Right ABI: 1.03

## 2024-11-08 SURGERY — LOWER EXTREMITY ANGIOGRAPHY
Anesthesia: LOCAL | Laterality: Left

## 2024-11-08 MED ORDER — INSULIN GLARGINE-YFGN 100 UNIT/ML ~~LOC~~ SOLN
25.0000 [IU] | Freq: Every day | SUBCUTANEOUS | Status: DC
  Administered 2024-11-08 – 2024-11-09 (×2): 25 [IU] via SUBCUTANEOUS
  Filled 2024-11-08 (×3): qty 0.25

## 2024-11-08 NOTE — Inpatient Diabetes Management (Addendum)
 Inpatient Diabetes Program Recommendations  AACE/ADA: New Consensus Statement on Inpatient Glycemic Control   Target Ranges:  Prepandial:   less than 140 mg/dL      Peak postprandial:   less than 180 mg/dL (1-2 hours)      Critically ill patients:  140 - 180 mg/dL   Lab Results  Component Value Date   GLUCAP 191 (H) 11/08/2024   HGBA1C 9.1 (H) 11/06/2024    Latest Reference Range & Units 11/06/24 17:51 11/06/24 20:14 11/07/24 12:17 11/07/24 16:32 11/07/24 20:07 11/08/24 07:21  Glucose-Capillary 70 - 99 mg/dL 737 (H) 796 (H) 816 (H) 223 (H) 351 (H) 191 (H)   Review of Glycemic Control  Diabetes history: DM2  Outpatient Diabetes medications:  Lantus  20 units daily   Current orders for Inpatient glycemic control:  Semglee  20 units at bedtime  Novolog  0-6 units TID + 0-5 units at bedtime   Inpatient Diabetes Program Recommendations:   Please consider increasing Semglee  to 25 units daily   Addendum: Spoke with patient and patient's daughter at bedside. Confirmed patient takes Lantus  20 units daily at 7:00pm regularly. She is followed by [facility] outpatient and denies any issues obtaining her medications for diabetes. She does have a meter at home to check her blood sugar but does not check it regularly.  Discussed A1C and blood glucose goals. Current A1C 9.1% (Average 214 mg/dl). Patient and daughter state patient drinks Coke regularly. Encouraged her to limit regular drinks and replace with water  or diet drinks. Also encouraged pt to start checking her blood sugar atleast 3-4 times per day (before each meal and at bedtime). Discussed importance of checking CBGs and maintaining good CBG control to prevent long-term and short-term complications. Explained how hyperglycemia leads to damage within blood vessels which lead to the common complications seen with uncontrolled diabetes. Discussed impact of nutrition, stress, sickness, and medications on diabetes control. Discussed  carbohydrates, carbohydrate goals per day and meal, along with portion sizes. Patient and daughter verbalized understanding of information discussed and reports no further questions at this time related to diabetes.    Thanks,  Lavanda Search, RN, MSN, [facility]  Inpatient Diabetes Coordinator  Pager [phone] (8a-5p)

## 2024-11-08 NOTE — Plan of Care (Signed)

## 2024-11-08 NOTE — Progress Notes (Signed)
 After discussing lower extremity angio and amputation with her daughter, Ronal has changed her mind. Plan for angiography tomorrow. Please make n.p.o. midnight.  Would appreciate dialysis tonight if possible.

## 2024-11-08 NOTE — Progress Notes (Signed)
 PROGRESS NOTE    Samantha [name]  FMW:[id number] DOB: Jun 02, 1951 DOA: 11/06/2024 PCP: Cloria Annabella CROME, DO    Brief Narrative:   72 year old with history of ESRD TTS, DM 2, critical lower limb ischemia status post angio 2016 with arthrectomy presented to the ED with left toe pain.  Workup concerning for diabetic foot infection therefore admitted.  MRI confirmed left toe osteomyelitis.  ID, vascular and podiatry consulted.  Currently on broad-spectrum antibiotics with plans for angio followed by amputation.  Assessment & Plan:  Left lower lobe critical ischemia with tissue loss left toe osteomyelitis Peripheral arterial disease with history of critical limb ischemia 2016 - Nonpalpable pulses.  MRI Foot consistent with osteomyelitis. LE Angio today and eventually will require amputation per podiatry - Currently on empiric Unasyn and linezolid.  ID following - On Plavix   Diabetes mellitus type 2 uncontrolled hyperglycemia - A1c 9.1. Long-acting and sliding scale.  Adjust as necessary  ESRD on HD -Nephrology consulted  Essential hypertension - Coreg , lisinopril   Microcytic anemia - Hb 11.1; stable.   DVT prophylaxis: Lovenox     Code Status: Do not attempt resuscitation (DNR) PRE-ARREST INTERVENTIONS DESIRED Family Communication: Family updated   PT Follow up Recs:   Subjective: Interpreter ID [id number]  No new complaints at this time.  Declining surgery.  To my best knowledge I did explain her why amputation is necessary to prevent worsening.  She is not agreeable.   Examination:  General exam: Appears calm and comfortable  Respiratory system: Clear to auscultation. Respiratory effort normal. Cardiovascular system: S1 & S2 heard, RRR. No JVD, murmurs, rubs, gallops or clicks. No pedal edema. Gastrointestinal system: Abdomen is nondistended, soft and nontender. No organomegaly or masses felt. Normal bowel sounds heard. Central nervous system: Alert and oriented. No  focal neurological deficits. Extremities: Symmetric 5 x 5 power. Skin: Toe erythema noted Psychiatry: Judgement and insight appear normal. Mood & affect appropriate.                Diet Orders (From admission, onward)     Start     Ordered   11/08/24 1026  Diet NPO time specified  Diet effective now        11/08/24 1025            Objective: Vitals:   11/07/24 1955 11/08/24 0024 11/08/24 0522 11/08/24 0722  BP: 117/62 121/70 (!) 108/57 (!) 131/52  Pulse: 79 71 74 73  Resp:    18  Temp: 98.4 F (36.9 C) 98.3 F (36.8 C) 98.2 F (36.8 C) 98.4 F (36.9 C)  TempSrc:      SpO2: 100% 100% 98% 99%  Weight:      Height:        Intake/Output Summary (Last 24 hours) at 11/08/2024 1100 Last data filed at 11/07/2024 2100 Gross per 24 hour  Intake 300 ml  Output 2800 ml  Net -2500 ml   Filed Weights   11/06/24 1817 11/07/24 0821  Weight: 62.2 kg 65 kg    Scheduled Meds:  bisacodyl   10 mg Oral Q1500   brimonidine   1 drop Right Eye BID   carvedilol   6.25 mg Oral BID WC   Chlorhexidine  Gluconate Cloth  6 each Topical Q0600   clopidogrel   75 mg Oral Daily   dorzolamide -timolol   1 drop Right Eye BID   enoxaparin  (LOVENOX ) injection  30 mg Subcutaneous Q24H   insulin  aspart  0-5 Units Subcutaneous QHS   insulin  aspart  0-6 Units Subcutaneous TID WC  insulin  glargine-yfgn  20 Units Subcutaneous QHS   latanoprost   1 drop Right Eye QHS   linezolid  600 mg Oral Q12H   lisinopril   2.5 mg Oral Daily   living well with diabetes book- in spanish   Does not apply Once   sevelamer  carbonate  800 mg Oral TID WC   sodium chloride  flush  3 mL Intravenous Q12H   sodium chloride  flush  3 mL Intravenous Q12H   Continuous Infusions:  ampicillin-sulbactam (UNASYN) IV 3 g (11/08/24 0546)    Nutritional status Signs/Symptoms:  (diabtetic foot ulcer) Interventions: MVI, Juven, Ensure Enlive (each supplement provides 350kcal and 20 grams of protein) Body mass index is 24.6  kg/m.  Data Reviewed:   CBC: Recent Labs  Lab 11/06/24 1220 11/07/24 0239 11/07/24 2329  WBC 8.2 6.9 9.7  NEUTROABS 6.1  --   --   HGB 9.8* 11.1* 9.6*  HCT 29.5* 32.2* 26.9*  MCV 103.5* 102.5* 99.6  PLT 246 241 256   Basic Metabolic Panel: Recent Labs  Lab 11/06/24 1220 11/07/24 0239 11/08/24 0221  NA 137 136 135  K 3.7 4.4 4.1  CL 100 101 96*  CO2 24 18* 24  GLUCOSE 292* 243* 178*  BUN 31* 33* 22  CREATININE 3.56* 3.73* 3.70*  CALCIUM  9.7 9.4 8.6*  MG  --   --  2.0   GFR: Estimated Creatinine Clearance: 12 mL/min (A) (by C-G formula based on SCr of 3.7 mg/dL (H)). Liver Function Tests: Recent Labs  Lab 11/06/24 1220  AST 19  ALT 11  ALKPHOS 139*  BILITOT 0.5  PROT 7.2  ALBUMIN  4.0   No results for input(s): LIPASE, AMYLASE in the last 168 hours. No results for input(s): AMMONIA in the last 168 hours. Coagulation Profile: No results for input(s): INR, PROTIME in the last 168 hours. Cardiac Enzymes: No results for input(s): CKTOTAL, CKMB, CKMBINDEX, TROPONINI in the last 168 hours. BNP (last 3 results) No results for input(s): PROBNP in the last 8760 hours. HbA1C: Recent Labs    11/06/24 1713  HGBA1C 9.1*   CBG: Recent Labs  Lab 11/06/24 2014 11/07/24 1217 11/07/24 1632 11/07/24 2007 11/08/24 0721  GLUCAP 203* 183* 223* 351* 191*   Lipid Profile: No results for input(s): CHOL, HDL, LDLCALC, TRIG, CHOLHDL, LDLDIRECT in the last 72 hours. Thyroid Function Tests: No results for input(s): TSH, T4TOTAL, FREET4, T3FREE, THYROIDAB in the last 72 hours. Anemia Panel: No results for input(s): VITAMINB12, FOLATE, FERRITIN, TIBC, IRON, RETICCTPCT in the last 72 hours. Sepsis Labs: Recent Labs  Lab 11/06/24 1226 11/06/24 1320 11/06/24 1418  LATICACIDVEN 1.0 1.6 1.5    Recent Results (from the past 240 hours)  Blood culture (routine x 2)     Status: None (Preliminary result)   Collection  Time: 11/06/24 12:50 PM   Specimen: BLOOD RIGHT ARM  Result Value Ref Range Status   Specimen Description   Final    BLOOD RIGHT ARM Performed at [facility], [address], [place], [place] [address]    Special Requests   Final    BOTTLES DRAWN AEROBIC AND ANAEROBIC Blood Culture results may not be optimal due to an inadequate volume of blood received in culture bottles Performed at [facility], [address] [place], [place] [address]    Culture   Final    NO GROWTH 2 DAYS Performed at [facility], [address], [place], [place] [address]    Report Status PENDING  Incomplete  Blood culture (routine x 2)     Status: None (Preliminary result)   Collection Time: 11/06/24  1:00 PM   Specimen: BLOOD RIGHT HAND  Result Value Ref Range Status   Specimen Description   Final    BLOOD RIGHT HAND Performed at [facility], [address], [place], [place] [address]    Special Requests   Final    BOTTLES DRAWN AEROBIC AND ANAEROBIC Blood Culture results may not be optimal due to an inadequate volume of blood received in culture bottles Performed at [facility], [address], [place], [place] [address]    Culture   Final    NO GROWTH 2 DAYS Performed at [facility], [address], [place], [place] [address]    Report Status PENDING  Incomplete         Radiology Studies: VAS US  ABI WITH/WO TBI Result Date: 11/08/2024  LOWER EXTREMITY DOPPLER STUDY Patient Name:  Samantha [name]  Date of Exam:   11/07/2024 Medical Rec #: [id number]            Accession #:    [id number] Date of Birth: [birth year]            Patient Gender: F Patient Age:   39 years Exam Location:  [facility] Procedure:      VAS US  ABI WITH/WO TBI Referring Phys: TORIBIO DOOR --------------------------------------------------------------------------------  Indications: Ulceration, and peripheral artery disease. critical limb ischemia  High Risk Factors: Diabetes.  Vascular Interventions: Atherectomy and angioplasty of the left SAF and above                         thee knee popliteal artery on 5.19.2016. Comparison Study: No prior exams on file. Performing Technologist: Edilia Elden Appl  Examination Guidelines: A complete evaluation includes at minimum, Doppler waveform signals and systolic blood pressure reading at the level of bilateral brachial, anterior tibial, and posterior tibial arteries, when vessel segments are accessible. Bilateral testing is considered an integral part of a complete examination. Photoelectric Plethysmograph (PPG) waveforms and toe systolic pressure readings are included as required and additional duplex testing as needed. Limited examinations for reoccurring indications may be performed as noted.  ABI Findings: +---------+------------------+-----+---------+--------+ Right    Rt Pressure (mmHg)IndexWaveform Comment  +---------+------------------+-----+---------+--------+ Brachial 157                    triphasic         +---------+------------------+-----+---------+--------+ PTA      255               1.62 biphasic          +---------+------------------+-----+---------+--------+ DP       162               1.03 biphasic          +---------+------------------+-----+---------+--------+ Great Toe102               0.65 Normal            +---------+------------------+-----+---------+--------+ +---------+------------------+-----+----------+-----------+ Left     Lt Pressure (mmHg)IndexWaveform  Comment     +---------+------------------+-----+----------+-----------+ Brachial                                  Restricted. +---------+------------------+-----+----------+-----------+ PTA      93  0.59 monophasic            +---------+------------------+-----+----------+-----------+ DP       152               0.97 monophasic             +---------+------------------+-----+----------+-----------+ Great Toe101               0.64 Normal                +---------+------------------+-----+----------+-----------+ +-------+-----------+-----------+------------+------------+ ABI/TBIToday's ABIToday's TBIPrevious ABIPrevious TBI +-------+-----------+-----------+------------+------------+ Right  1.03       0.65                                +-------+-----------+-----------+------------+------------+ Left   0.97       0.64                                +-------+-----------+-----------+------------+------------+ Arterial wall calcification precludes accurate ankle pressures and ABIs.  Summary: Right: Resting right ankle-brachial index indicates noncompressible right lower extremity arteries. The right toe-brachial index is abnormal. Right toe pressure is >60 mmHg which suggests adequate perfusion for healing. Left: Resting left ankle-brachial index is within normal range. The left toe-brachial index is abnormal. Left toe pressure is >60 mmHg which suggests adequate perfusion for healing. *See table(s) above for measurements and observations.  Electronically signed by Lonni Gaskins MD on 11/08/2024 at 10:24:32 AM.    Final    MR FOOT LEFT WO CONTRAST Result Date: 11/07/2024 CLINICAL DATA:  Diabetic foot swelling, clinical concern for osteomyelitis EXAM: MRI OF THE LEFT FOOT WITHOUT CONTRAST TECHNIQUE: Multiplanar, multisequence MR imaging of the left foot from the midfoot through the toes was performed. No intravenous contrast was administered. COMPARISON:  Radiographs 11/06/2024 FINDINGS: Bones/Joint/Cartilage Diffuse marrow edema signal in the distal phalanx fourth toe suspicious for early osteomyelitis. Trace edema signal in the tuft of the distal phalanx third toe is nonspecific. Ligaments Lisfranc ligament intact. Muscles and Tendons Low-level regional muscular edema is likely neurogenic. Nodular thickening of the plantar  fascia on image 13 series 17 suspicious for plantar fibromatosis. Soft tissues Low-grade subcutaneous edema and overlying cutaneous thickening in the fourth toe and to a lesser extent in the second and third toes. Dorsal subcutaneous edema in the forefoot laterally. No drainable abscess observed. IMPRESSION: 1. Diffuse marrow edema signal in the distal phalanx fourth toe suspicious for early osteomyelitis. 2. Trace edema signal in the tuft of the distal phalanx third toe is nonspecific. 3. Low-grade subcutaneous edema and overlying cutaneous thickening in the fourth toe and to a lesser extent in the second and third toes. 4. Nodular thickening of the plantar fascia suspicious for plantar fibromatosis. 5. Low-level regional muscular edema is likely neurogenic. Electronically Signed   By: Ryan Salvage M.D.   On: 11/07/2024 08:18   DG Foot Complete Left Result Date: 11/06/2024 CLINICAL DATA:  Concern for osteomyelitis. EXAM: LEFT FOOT - COMPLETE 3+ VIEW COMPARISON:  Left foot radiograph dated 01/16/2016. FINDINGS: No acute fracture or dislocation. Mild osteopenia. No significant arthritic changes. No bone erosion or periosteal elevation. Vascular calcifications noted. The soft tissues are unremarkable IMPRESSION: 1. No acute fracture or dislocation. 2. No radiographic evidence of osteomyelitis. Electronically Signed   By: Vanetta Chou M.D.   On: 11/06/2024 13:43           LOS: 1 day  Time spent= 35 mins    Burgess JAYSON Dare, MD [facility]  If 7PM-7AM, please contact night-coverage  11/08/2024, 11:00 AM

## 2024-11-08 NOTE — Progress Notes (Signed)
 Transition of Care [facility]) - Inpatient Brief Assessment   Patient Details  Name: Fabiha Rougeau MRN: [id number] Date of Birth: 1951-01-02  Transition of [facility] CM/SW Contact:    Rosaline JONELLE Joe, RN Phone Number: 11/08/2024, 2:17 PM   Clinical Narrative: CM met with the patient and daughter, Alden at the bedside.  Spanish Interpreter provided.  The patient admitted to the hospital for Left 4th toe infection.  Patient was seen by vascular surgery and patient has planned procedure today and likely surgery planned for Friday, 11/10/24 for toe amputation.    Patient is normally independent and goes to [facility] on Mondays.    Daughter states that patient has no DME at the home.  The daughters provide transportation to appointments as needed.    I called and left a voicemail with SW at [facility] for update.   Transition of Care Asessment: Insurance and Status: (P) Insurance coverage has been reviewed Patient has primary care physician: (P) Yes Home environment has been reviewed: (P) from home with daughter, Will Prior level of function:: (P) self Prior/Current Home Services: (P) Current home services (receives services through [facility]) Social Drivers of Health Review: (P) SDOH reviewed needs interventions Readmission risk has been reviewed: (P) Yes Transition of care needs: (P) transition of care needs identified, TOC will continue to follow

## 2024-11-08 NOTE — Progress Notes (Signed)
  Daily Progress Note  CLI of the left lower extremity with tissue loss  Subjective: No complaints this morning.  Denies fevers chills.  States foot feels better, erythema improved  Objective: Vitals:   11/08/24 0522 11/08/24 0722  BP: (!) 108/57 (!) 131/52  Pulse: 74 73  Resp:  18  Temp: 98.2 F (36.8 C) 98.4 F (36.9 C)  SpO2: 98% 99%    Physical Examination Nonpalpable pedal pulses, palpable femoral pulses Nonlabored breathing Regular rate Cellulitis improved  ASSESSMENT/PLAN:  Patient is a 73 year old female with left lower extremity critical and ischemic tissue loss.  She is in need of left lower extremity angiogram in effort to define improve distal perfusion for wound healing.    This morning, Samantha [name] was eating breakfast.  She was not interested in moving forward with revascularization, stating that her foot felt better.  I had a long discussion with her regarding her lower extremity wound, osteomyelitis, and concern that the wound will not heal due to infection and lower extremity perfusion deficit.  We discussed her ABI.  I think this is falsely elevated due to her significant calcific disease.  We discussed that she does not have a palpable pulse in the foot, therefore I am concerned that even antibiotics will have a tough time reaching the toe.  Samantha [name] was adamant that she was not interested in any further interventions, and wanted antibiotics only.  I called her daughter in an effort to ensure that there was not a language barrier as all of the above was discussed with an interpreter.  We discussed that without lower extremity angiogram with possible intervention, I am not sure if the toe will heal, furthermore I am not sure if antibiotics would work.  She is aware that she is at high risk of higher level amputation and that a toe could become her entire leg if the infection worsens.    Again, she stated that she was not interested in intervention.  Her daughter supported this  decision.  No plan for intervention at this time.  I asked them to present to the emergency department should the wound worsen in any way.  If she is not interested in intervention and amputation, the only therapy I could recommend is long-term antibiotics.  Recommend ID involvement regarding this.   Samantha FORBES Rim MD MS [facility] [phone] 11/08/2024  9:00 AM

## 2024-11-08 NOTE — Progress Notes (Signed)
 [name] Progress Note   Subjective:   Seen in room, with translator app - feeling ok today. S/p HD yesterday and had syncopal episode at the end with hypotension - improved with IV bolus. She reports that happens as outpatient occasionally too. Had initially refused vascular intervention and toe amputation, now agreeable per hospitalist. Her only request is that the procedures are not done on a HD day - we can accommodate. No CP/dyspnea today. Had some diarrhea this AM - requesting stool softener to be stopped - will do.  Objective Vitals:   11/07/24 1955 11/08/24 0024 11/08/24 0522 11/08/24 0722  BP: 117/62 121/70 (!) 108/57 (!) 131/52  Pulse: 79 71 74 73  Resp:    18  Temp: 98.4 F (36.9 C) 98.3 F (36.8 C) 98.2 F (36.8 C) 98.4 F (36.9 C)  TempSrc:      SpO2: 100% 100% 98% 99%  Weight:      Height:       Physical Exam General: Well appearing woman, NAD. Room air Heart: RRR Lungs: CTAB Abdomen: soft Extremities: no LE edema; L foot with 4th toe infection and tracking erythema Dialysis Access: LUE AVF +t/b  Additional Objective Labs: Basic Metabolic Panel: Recent Labs  Lab 11/06/24 1220 11/07/24 0239 11/08/24 0221  NA 137 136 135  K 3.7 4.4 4.1  CL 100 101 96*  CO2 24 18* 24  GLUCOSE 292* 243* 178*  BUN 31* 33* 22  CREATININE 3.56* 3.73* 3.70*  CALCIUM  9.7 9.4 8.6*   Liver Function Tests: Recent Labs  Lab 11/06/24 1220  AST 19  ALT 11  ALKPHOS 139*  BILITOT 0.5  PROT 7.2  ALBUMIN  4.0   CBC: Recent Labs  Lab 11/06/24 1220 11/07/24 0239 11/07/24 2329  WBC 8.2 6.9 9.7  NEUTROABS 6.1  --   --   HGB 9.8* 11.1* 9.6*  HCT 29.5* 32.2* 26.9*  MCV 103.5* 102.5* 99.6  PLT 246 241 256   Blood Culture    Component Value Date/Time   SDES  11/06/2024 1300    BLOOD RIGHT HAND Performed at [facility], [address], [place], [place] [address]    SPECREQUEST  11/06/2024 1300    BOTTLES DRAWN AEROBIC AND ANAEROBIC Blood  Culture results may not be optimal due to an inadequate volume of blood received in culture bottles Performed at [facility], [address] [place], [place] [address]    CULT  11/06/2024 1300    NO GROWTH 2 DAYS Performed at [facility], [address], [place], [place] [address]    REPTSTATUS PENDING 11/06/2024 1300   Studies/Results: VAS US  ABI WITH/WO TBI Result Date: 11/07/2024  LOWER EXTREMITY DOPPLER STUDY Patient Name:  Samantha [name]  Date of Exam:   11/07/2024 Medical Rec #: [id number]            Accession #:    [id number] Date of Birth: 1951/01/20            Patient Gender: F Patient Age:   73 years Exam Location:  [facility] Procedure:      VAS US  ABI WITH/WO TBI Referring Phys: TORIBIO DOOR --------------------------------------------------------------------------------  Indications: Ulceration, and peripheral artery disease. critical limb ischemia High Risk Factors: Diabetes.  Vascular Interventions: Atherectomy and angioplasty of the left SAF and above                         thee knee popliteal  artery on 5.19.2016. Comparison Study: No prior exams on file. Performing Technologist: Edilia Elden Appl  Examination Guidelines: A complete evaluation includes at minimum, Doppler waveform signals and systolic blood pressure reading at the level of bilateral brachial, anterior tibial, and posterior tibial arteries, when vessel segments are accessible. Bilateral testing is considered an integral part of a complete examination. Photoelectric Plethysmograph (PPG) waveforms and toe systolic pressure readings are included as required and additional duplex testing as needed. Limited examinations for reoccurring indications may be performed as noted.  ABI Findings: +---------+------------------+-----+---------+--------+ Right    Rt Pressure (mmHg)IndexWaveform Comment  +---------+------------------+-----+---------+--------+ Brachial 157                     triphasic         +---------+------------------+-----+---------+--------+ PTA      255               1.62 biphasic          +---------+------------------+-----+---------+--------+ DP       162               1.03 biphasic          +---------+------------------+-----+---------+--------+ Great Toe102               0.65 Normal            +---------+------------------+-----+---------+--------+ +---------+------------------+-----+----------+-----------+ Left     Lt Pressure (mmHg)IndexWaveform  Comment     +---------+------------------+-----+----------+-----------+ Brachial                                  Restricted. +---------+------------------+-----+----------+-----------+ PTA      93                0.59 monophasic            +---------+------------------+-----+----------+-----------+ DP       152               0.97 monophasic            +---------+------------------+-----+----------+-----------+ Great Toe101               0.64 Normal                +---------+------------------+-----+----------+-----------+ +-------+-----------+-----------+------------+------------+ ABI/TBIToday's ABIToday's TBIPrevious ABIPrevious TBI +-------+-----------+-----------+------------+------------+ Right  1.03       0.65                                +-------+-----------+-----------+------------+------------+ Left   0.97       0.64                                +-------+-----------+-----------+------------+------------+   Arterial wall calcification precludes accurate ankle pressures and ABIs.  Summary: Right: Resting right ankle-brachial index indicates noncompressible right lower extremity arteries. The right toe-brachial index is abnormal. Right toe pressure is >60 mmHg which suggests adequate perfusion for healing. Left: Resting left ankle-brachial index is within normal range. The left toe-brachial index is abnormal. Left toe pressure is >60 mmHg which  suggests adequate perfusion for healing. *See table(s) above for measurements and observations.     Preliminary    MR FOOT LEFT WO CONTRAST Result Date: 11/07/2024 CLINICAL DATA:  Diabetic foot swelling, clinical concern for osteomyelitis EXAM: MRI OF THE LEFT FOOT WITHOUT CONTRAST TECHNIQUE: Multiplanar, multisequence MR imaging of the  left foot from the midfoot through the toes was performed. No intravenous contrast was administered. COMPARISON:  Radiographs 11/06/2024 FINDINGS: Bones/Joint/Cartilage Diffuse marrow edema signal in the distal phalanx fourth toe suspicious for early osteomyelitis. Trace edema signal in the tuft of the distal phalanx third toe is nonspecific. Ligaments Lisfranc ligament intact. Muscles and Tendons Low-level regional muscular edema is likely neurogenic. Nodular thickening of the plantar fascia on image 13 series 17 suspicious for plantar fibromatosis. Soft tissues Low-grade subcutaneous edema and overlying cutaneous thickening in the fourth toe and to a lesser extent in the second and third toes. Dorsal subcutaneous edema in the forefoot laterally. No drainable abscess observed. IMPRESSION: 1. Diffuse marrow edema signal in the distal phalanx fourth toe suspicious for early osteomyelitis. 2. Trace edema signal in the tuft of the distal phalanx third toe is nonspecific. 3. Low-grade subcutaneous edema and overlying cutaneous thickening in the fourth toe and to a lesser extent in the second and third toes. 4. Nodular thickening of the plantar fascia suspicious for plantar fibromatosis. 5. Low-level regional muscular edema is likely neurogenic. Electronically Signed   By: Ryan Salvage M.D.   On: 11/07/2024 08:18   DG Foot Complete Left Result Date: 11/06/2024 CLINICAL DATA:  Concern for osteomyelitis. EXAM: LEFT FOOT - COMPLETE 3+ VIEW COMPARISON:  Left foot radiograph dated 01/16/2016. FINDINGS: No acute fracture or dislocation. Mild osteopenia. No significant arthritic  changes. No bone erosion or periosteal elevation. Vascular calcifications noted. The soft tissues are unremarkable IMPRESSION: 1. No acute fracture or dislocation. 2. No radiographic evidence of osteomyelitis. Electronically Signed   By: Vanetta Chou M.D.   On: 11/06/2024 13:43   Medications:  ampicillin-sulbactam (UNASYN) IV 3 g (11/08/24 0546)    bisacodyl   10 mg Oral Q1500   brimonidine   1 drop Right Eye BID   carvedilol   6.25 mg Oral BID WC   Chlorhexidine  Gluconate Cloth  6 each Topical Q0600   clopidogrel   75 mg Oral Daily   dorzolamide -timolol   1 drop Right Eye BID   enoxaparin  (LOVENOX ) injection  30 mg Subcutaneous Q24H   insulin  aspart  0-5 Units Subcutaneous QHS   insulin  aspart  0-6 Units Subcutaneous TID WC   insulin  glargine-yfgn  20 Units Subcutaneous QHS   latanoprost   1 drop Right Eye QHS   linezolid  600 mg Oral Q12H   lisinopril   2.5 mg Oral Daily   living well with diabetes book- in spanish   Does not apply Once   sevelamer  carbonate  800 mg Oral TID WC   sodium chloride  flush  3 mL Intravenous Q12H   sodium chloride  flush  3 mL Intravenous Q12H    Dialysis Orders TTS - [facility] ([address]) 3.5hrs, 350/600, EDW 63kg, 2K/2.5Ca bath, AVF, no heparin  - Epogen 1200 units q HD - no VDRA  Assessment/Plan: L 4th toe infection/osteomyelitis: On Unasyn + linezolid. Initially refusing vascular intervention/amputation, now agreeable and scheduled for LE angiogram on Thurs and toe amputation on Friday. ESRD: Usual TTS schedule - pt had requested not to have HD on same day as procedure - will tentatively plan short HD today, then pick up following HD on Saturday. HTN/volume: BP good, no LE edema and syncopal episode last HD - low UFG next HD. 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 Continue home sevelamer  T2DM   Samantha [name], Samantha [name] 11/08/2024, 10:19 AM  [employer]

## 2024-11-08 NOTE — Progress Notes (Signed)
   PODIATRY PROGRESS NOTE Patient Name: Samantha [name]  DOB May 09, 1951 DOA 11/06/2024  Hospital Day: 3  Assessment:  73 y.o. female with PMHx significant for  ESRD TTS, DM 2, critical lower limb ischemia status post angio 2016 with arthrectomy  with ulceration and osteomyelitis distal phalanx left fourth toe   CRP 1.4 ESR 20 A1c: 9.1 MRI L foot: 1. Diffuse marrow edema signal in the distal phalanx fourth toe suspicious for early osteomyelitis. 2. Trace edema signal in the tuft of the distal phalanx third toe is nonspecific. 3. Low-grade subcutaneous edema and overlying cutaneous thickening in the fourth toe and to a lesser extent in the second and third  Plan:  - Plan for Left fourth toe amputation Friday AM, NPO MN prior, pt and her daughter agree to proceed, consent to be completed with RN  - Appreciate vascular surgery, plan for angio LLE tomorrow - Continue IV abx broad spectrum pending further culture data - Anticoagulation: ok to continue per vascular recs - Wound care: betadine paint pre op - WB status: wbat  - Will continue to follow        [name], DPM [facility]    Subjective:  Discussed with the patient and her daughter regarding whether they want to proceed with the angiogram and the amputation.  Discussed reasoning for both procedures and they are in agreement to proceed.  Explained that the amputation will likely occur Friday morning at 730.  All questions were answered in regards to the amputation procedure.  Objective:   Vitals:   11/08/24 0722 11/08/24 1148  BP: (!) 131/52 (!) 124/57  Pulse: 73 76  Resp: 18 18  Temp: 98.4 F (36.9 C) 98.4 F (36.9 C)  SpO2: 99% 100%       Latest Ref Rng & Units 11/07/2024   11:29 PM 11/07/2024    2:39 AM 11/06/2024   12:20 PM  CBC  WBC 4.0 - 10.5 K/uL 9.7  6.9  8.2   Hemoglobin 12.0 - 15.0 g/dL 9.6  88.8  9.8   Hematocrit 36.0 - 46.0 % 26.9  32.2  29.5   Platelets 150 - 400 K/uL 256   241  246        Latest Ref Rng & Units 11/08/2024    2:21 AM 11/07/2024    2:39 AM 11/06/2024   12:20 PM  BMP  Glucose 70 - 99 mg/dL 821  756  707   BUN 8 - 23 mg/dL 22  33  31   Creatinine 0.44 - 1.00 mg/dL 6.29  6.26  6.43   Sodium 135 - 145 mmol/L 135  136  137   Potassium 3.5 - 5.1 mmol/L 4.1  4.4  3.7   Chloride 98 - 111 mmol/L 96  101  100   CO2 22 - 32 mmol/L 24  18  24    Calcium  8.9 - 10.3 mg/dL 8.6  9.4  9.7     General: AAOx3, NAD  Lower Extremity Exam Non palpable DP and PT pulses left foot   Ulceration distal aspect of left fourth toe with mild erythema and edema   Decreased streaking erythema up foot   Pain on palpation of left fourth toe      Radiology:  Results reviewed. See assessment for pertinent imaging results

## 2024-11-09 ENCOUNTER — Encounter (HOSPITAL_COMMUNITY)
Admission: EM | Disposition: A | Discharge status: Home / Self Care | Payer: Self-pay | Source: Ambulatory Visit | Attending: Internal Medicine

## 2024-11-09 DIAGNOSIS — N186 End stage renal disease: Secondary | ICD-10-CM | POA: Diagnosis not present

## 2024-11-09 DIAGNOSIS — I70245 Atherosclerosis of native arteries of left leg with ulceration of other part of foot: Secondary | ICD-10-CM

## 2024-11-09 DIAGNOSIS — L97529 Non-pressure chronic ulcer of other part of left foot with unspecified severity: Secondary | ICD-10-CM | POA: Diagnosis not present

## 2024-11-09 DIAGNOSIS — I7092 Chronic total occlusion of artery of the extremities: Secondary | ICD-10-CM | POA: Diagnosis not present

## 2024-11-09 DIAGNOSIS — E11628 Type 2 diabetes mellitus with other skin complications: Secondary | ICD-10-CM | POA: Diagnosis not present

## 2024-11-09 DIAGNOSIS — L089 Local infection of the skin and subcutaneous tissue, unspecified: Secondary | ICD-10-CM | POA: Diagnosis not present

## 2024-11-09 HISTORY — PX: LOWER EXTREMITY INTERVENTION: CATH118252

## 2024-11-09 HISTORY — PX: LOWER EXTREMITY ANGIOGRAPHY: CATH118251

## 2024-11-09 LAB — BASIC METABOLIC PANEL WITH GFR
Anion gap: 11 (ref 5–15)
BUN: 15 mg/dL (ref 8–23)
CO2: 27 mmol/L (ref 22–32)
Calcium: 8.6 mg/dL — ABNORMAL LOW (ref 8.9–10.3)
Chloride: 94 mmol/L — ABNORMAL LOW (ref 98–111)
Creatinine, Ser: 3.45 mg/dL — ABNORMAL HIGH (ref 0.44–1.00)
GFR, Estimated: 14 mL/min — ABNORMAL LOW (ref >60)
Glucose, Bld: 97 mg/dL (ref 70–99)
Potassium: 3.9 mmol/L (ref 3.5–5.1)
Sodium: 132 mmol/L — ABNORMAL LOW (ref 135–145)

## 2024-11-09 LAB — GLUCOSE, CAPILLARY
Glucose-Capillary: 92 mg/dL (ref 70–99)
Glucose-Capillary: 82 mg/dL (ref 70–99)
Glucose-Capillary: 93 mg/dL (ref 70–99)
Glucose-Capillary: 145 mg/dL — ABNORMAL HIGH (ref 70–99)

## 2024-11-09 SURGERY — LOWER EXTREMITY ANGIOGRAPHY
Anesthesia: LOCAL

## 2024-11-09 MED ORDER — HEPARIN (PORCINE) IN NACL 2000-0.9 UNIT/L-% IV SOLN
INTRAVENOUS | Status: DC | PRN
  Administered 2024-11-09: 1000 mL

## 2024-11-09 MED ORDER — IODIXANOL 320 MG/ML IV SOLN
INTRAVENOUS | Status: DC | PRN
  Administered 2024-11-09: 60 mL

## 2024-11-09 MED ORDER — LABETALOL HCL 5 MG/ML IV SOLN: 10.0000 mg | INTRAVENOUS | Status: DC | PRN

## 2024-11-09 MED ORDER — HYDRALAZINE HCL 20 MG/ML IJ SOLN: 5.0000 mg | INTRAMUSCULAR | Status: DC | PRN

## 2024-11-09 MED ORDER — MIDAZOLAM HCL 2 MG/2ML IJ SOLN
INTRAMUSCULAR | Status: AC
  Filled 2024-11-09: qty 2

## 2024-11-09 MED ORDER — CLOPIDOGREL BISULFATE 75 MG PO TABS
ORAL_TABLET | ORAL | Status: AC
  Filled 2024-11-09: qty 1

## 2024-11-09 MED ORDER — ASPIRIN 81 MG PO CHEW
CHEWABLE_TABLET | ORAL | Status: DC | PRN
  Administered 2024-11-09 (×2): 81 mg via ORAL

## 2024-11-09 MED ORDER — ONDANSETRON HCL 4 MG/2ML IJ SOLN
INTRAMUSCULAR | Status: DC | PRN
  Administered 2024-11-09: 4 mg via INTRAVENOUS

## 2024-11-09 MED ORDER — ASPIRIN 81 MG PO CHEW
CHEWABLE_TABLET | ORAL | Status: AC
  Filled 2024-11-09: qty 1

## 2024-11-09 MED ORDER — ONDANSETRON HCL 4 MG/2ML IJ SOLN
INTRAMUSCULAR | Status: AC
  Filled 2024-11-09: qty 2

## 2024-11-09 MED ORDER — FENTANYL CITRATE (PF) 100 MCG/2ML IJ SOLN
INTRAMUSCULAR | Status: AC
  Filled 2024-11-09: qty 2

## 2024-11-09 MED ORDER — FENTANYL CITRATE (PF) 100 MCG/2ML IJ SOLN
INTRAMUSCULAR | Status: DC | PRN
  Administered 2024-11-09: 50 ug via INTRAVENOUS

## 2024-11-09 MED ORDER — LIDOCAINE HCL (PF) 1 % IJ SOLN
INTRAMUSCULAR | Status: AC
  Filled 2024-11-09: qty 30

## 2024-11-09 MED ORDER — SODIUM CHLORIDE 0.9 % IV SOLN: 250.0000 mL | INTRAVENOUS | Status: AC | PRN

## 2024-11-09 MED ORDER — MIDAZOLAM HCL (PF) 2 MG/2ML IJ SOLN
INTRAMUSCULAR | Status: DC | PRN
  Administered 2024-11-09: .5 mg via INTRAVENOUS

## 2024-11-09 MED ORDER — ATORVASTATIN CALCIUM 40 MG PO TABS
40.0000 mg | ORAL_TABLET | Freq: Every day | ORAL | Status: DC
  Administered 2024-11-09 – 2024-11-10 (×2): 40 mg via ORAL
  Filled 2024-11-09 (×2): qty 1

## 2024-11-09 MED ORDER — LIDOCAINE HCL (PF) 1 % IJ SOLN
INTRAMUSCULAR | Status: DC | PRN
  Administered 2024-11-09: 15 mL

## 2024-11-09 MED ORDER — CLOPIDOGREL BISULFATE 300 MG PO TABS
ORAL_TABLET | ORAL | Status: DC | PRN
  Administered 2024-11-09: 75 mg via ORAL

## 2024-11-09 MED ORDER — CLOPIDOGREL BISULFATE 75 MG PO TABS
ORAL_TABLET | ORAL | Status: DC | PRN
  Administered 2024-11-09: 75 mg via ORAL

## 2024-11-09 MED ORDER — HEPARIN SODIUM (PORCINE) 1000 UNIT/ML IJ SOLN
INTRAMUSCULAR | Status: DC | PRN
  Administered 2024-11-09: 7000 [IU] via INTRAVENOUS

## 2024-11-09 MED ORDER — SODIUM CHLORIDE 0.9% FLUSH
3.0000 mL | Freq: Two times a day (BID) | INTRAVENOUS | Status: DC
  Administered 2024-11-09 – 2024-11-10 (×2): 3 mL via INTRAVENOUS

## 2024-11-09 MED ORDER — ASPIRIN 81 MG PO TBEC
81.0000 mg | DELAYED_RELEASE_TABLET | Freq: Every day | ORAL | Status: DC
  Administered 2024-11-10: 81 mg via ORAL
  Filled 2024-11-09: qty 1

## 2024-11-09 MED ORDER — SODIUM CHLORIDE 0.9% FLUSH: 3.0000 mL | INTRAVENOUS | Status: DC | PRN

## 2024-11-09 SURGICAL SUPPLY — 18 items
BALLOON COYOTE OTW 2.5X150X150 (BALLOONS) IMPLANT
BALLOON JADE .014 3.0 X 40 (BALLOONS) IMPLANT
CATH OMNI FLUSH 5F 65CM (CATHETERS) IMPLANT
CATH QUICKCROSS .018X135CM (MICROCATHETER) IMPLANT
DEVICE CLOSURE MYNXGRIP 5F (Vascular Products) IMPLANT
KIT ENCORE 26 ADVANTAGE (KITS) IMPLANT
KIT MICROPUNCTURE NIT STIFF (SHEATH) IMPLANT
PACK CARDIAC CATHETERIZATION (CUSTOM PROCEDURE TRAY) IMPLANT
SET ATX-X65L (MISCELLANEOUS) IMPLANT
SHEATH CATAPULT 5F 45 MP (SHEATH) IMPLANT
SHEATH PINNACLE 5F 10CM (SHEATH) IMPLANT
SHEATH PROBE COVER 6X72 (BAG) IMPLANT
STENT ESPRIT BTK 3.0X38 SCAFF (Permanent Stent) IMPLANT
WIRE BENTSON .035X145CM (WIRE) IMPLANT
WIRE G V18X300CM (WIRE) IMPLANT
WIRE HI TORQ COMMND ES.014X300 (WIRE) IMPLANT
WIRE HI TORQ STEELCORE18 300CM (WIRE) IMPLANT
WIRE SPARTACORE .014X300CM (WIRE) IMPLANT

## 2024-11-09 NOTE — Progress Notes (Signed)
   11/09/24 0215  Vitals  Temp 98.1 F (36.7 C)  Temp Source Oral  BP (!) 141/57  MAP (mmHg) 80  BP Location Right Arm  BP Method Automatic  Patient Position (if appropriate) Lying  Pulse Rate 67  Pulse Rate Source Monitor  ECG Heart Rate 67  Resp 13  Oxygen Therapy  SpO2 100 %  During Treatment Monitoring  Blood Flow Rate (mL/min) 0 mL/min  Arterial Pressure (mmHg) -33.13 mmHg  Venous Pressure (mmHg) 81 mmHg  TMP (mmHg) 7.88 mmHg  Ultrafiltration Rate (mL/min) 547 mL/min  Dialysate Flow Rate (mL/min) 300 ml/min  Dialysate Potassium Concentration 3  Dialysate Calcium  Concentration 2.5  Duration of HD Treatment -hour(s) 2.08 hour(s)  Cumulative Fluid Removed (mL) per Treatment  550.1  HD Safety Checks Performed Yes  Intra-Hemodialysis Comments Tx completed  Post Treatment  Dialyzer Clearance Lightly streaked  Liters Processed 43.7  Fluid Removed (mL) 600 mL  Tolerated HD Treatment Yes  AVG/AVF Arterial Site Held (minutes) 10 minutes  AVG/AVF Venous Site Held (minutes) 10 minutes  Fistula / Graft Left Upper arm Arteriovenous fistula  No placement date or time found.   Placed prior to admission: Yes  Orientation: Left  Access Location: Upper arm  Access Type: Arteriovenous fistula  Site Condition No complications  Fistula / Graft Assessment Present;Thrill;Bruit  Status Deaccessed  Needle Size 15  Drainage Description None  Hemodialysis Catheter Right Internal jugular Double lumen Permanent (Tunneled)  Placement Date/Time: 05/17/23 1541   Serial / Lot #: [id number]  Expiration Date: 10/28/27  Time Out: Correct patient;Correct site;Correct procedure  Maximum sterile barrier precautions: Hand hygiene;Cap;Mask;Sterile gown;Sterile gloves;Large sterile s...  Site Condition No complications

## 2024-11-09 NOTE — Progress Notes (Signed)
 PROGRESS NOTE    Samantha [name]  FMW:[id number] DOB: May 29, 1951 DOA: 11/06/2024 PCP: Cloria Annabella CROME, DO    Brief Narrative:   73 year old with history of ESRD TTS, DM 2, critical lower limb ischemia status post angio 2016 with arthrectomy presented to the ED with left toe pain.  Workup concerning for diabetic foot infection therefore admitted.  MRI confirmed left toe osteomyelitis.  ID, vascular and podiatry consulted.  Currently on broad-spectrum antibiotics with plans for angio followed by amputation.  Assessment & Plan:  Left lower lobe critical ischemia with tissue loss left toe osteomyelitis Peripheral arterial disease with history of critical limb ischemia 2016 - Nonpalpable pulses.  MRI Foot consistent with osteomyelitis. LE angiogram hopefully today followed by amputation tomorrow. - Currently on empiric Unasyn and linezolid.  ID following - On Plavix   Diabetes mellitus type 2 uncontrolled hyperglycemia - A1c 9.1. Long-acting and sliding scale.  Adjust as necessary  ESRD on HD -Nephrology consulted for HD  Essential hypertension - Coreg , lisinopril   Microcytic anemia - Hb 11.1; stable.   DVT prophylaxis: Lovenox     Code Status: Do not attempt resuscitation (DNR) PRE-ARREST INTERVENTIONS DESIRED Family Communication: Family updated   PT Follow up Recs:   Subjective: Feels ok no complaints.   Spoke with daughter over the phone while I was in the room.   Agreeable for angio today   Examination:  General exam: Appears calm and comfortable  Respiratory system: Clear to auscultation. Respiratory effort normal. Cardiovascular system: S1 & S2 heard, RRR. No JVD, murmurs, rubs, gallops or clicks. No pedal edema. Gastrointestinal system: Abdomen is nondistended, soft and nontender. No organomegaly or masses felt. Normal bowel sounds heard. Central nervous system: Alert and oriented. No focal neurological deficits. Extremities: Symmetric 5 x 5 power. Skin:  Toe erythema noted Psychiatry: Judgement and insight appear normal. Mood & affect appropriate.                Diet Orders (From admission, onward)     Start     Ordered   11/08/24 1347  Diet Carb Modified Fluid consistency: Thin; Room service appropriate? No; Fluid restriction: 2000 mL Fluid  Diet effective now       Question Answer Comment  Diet-HS Snack? Nothing   Calorie Level Medium 1600-2000   Fluid consistency: Thin   Room service appropriate? No   Fluid restriction: 2000 mL Fluid      11/08/24 1347            Objective: Vitals:   11/09/24 0200 11/09/24 0215 11/09/24 0518 11/09/24 0700  BP:  (!) 141/57 (!) 143/83 133/61  Pulse: 67 67 78 71  Resp: 12 13  13   Temp:  98.1 F (36.7 C) 98.4 F (36.9 C)   TempSrc:  Oral    SpO2: 100% 100% 100% 100%  Weight:      Height:        Intake/Output Summary (Last 24 hours) at 11/09/2024 0855 Last data filed at 11/09/2024 0215 Gross per 24 hour  Intake --  Output 600 ml  Net -600 ml   Filed Weights   11/06/24 1817 11/07/24 0821  Weight: 62.2 kg 65 kg    Scheduled Meds:  brimonidine   1 drop Right Eye BID   carvedilol   6.25 mg Oral BID WC   Chlorhexidine  Gluconate Cloth  6 each Topical Q0600   clopidogrel   75 mg Oral Daily   dorzolamide -timolol   1 drop Right Eye BID   enoxaparin  (LOVENOX ) injection  30 mg  Subcutaneous Q24H   insulin  aspart  0-5 Units Subcutaneous QHS   insulin  aspart  0-6 Units Subcutaneous TID WC   insulin  glargine-yfgn  25 Units Subcutaneous QHS   latanoprost   1 drop Right Eye QHS   linezolid  600 mg Oral Q12H   lisinopril   2.5 mg Oral Daily   living well with diabetes book- in spanish   Does not apply Once   sevelamer  carbonate  800 mg Oral TID WC   sodium chloride  flush  3 mL Intravenous Q12H   sodium chloride  flush  3 mL Intravenous Q12H   Continuous Infusions:  ampicillin-sulbactam (UNASYN) IV 3 g (11/09/24 0631)    Nutritional status Signs/Symptoms:  (diabtetic foot  ulcer) Interventions: MVI, Juven, Ensure Enlive (each supplement provides 350kcal and 20 grams of protein) Body mass index is 24.6 kg/m.  Data Reviewed:   CBC: Recent Labs  Lab 11/06/24 1220 11/07/24 0239 11/07/24 2329  WBC 8.2 6.9 9.7  NEUTROABS 6.1  --   --   HGB 9.8* 11.1* 9.6*  HCT 29.5* 32.2* 26.9*  MCV 103.5* 102.5* 99.6  PLT 246 241 256   Basic Metabolic Panel: Recent Labs  Lab 11/06/24 1220 11/07/24 0239 11/08/24 0221 11/09/24 0611  NA 137 136 135 132*  K 3.7 4.4 4.1 3.9  CL 100 101 96* 94*  CO2 24 18* 24 27  GLUCOSE 292* 243* 178* 97  BUN 31* 33* 22 15  CREATININE 3.56* 3.73* 3.70* 3.45*  CALCIUM  9.7 9.4 8.6* 8.6*  MG  --   --  2.0  --    GFR: Estimated Creatinine Clearance: 12.9 mL/min (A) (by C-G formula based on SCr of 3.45 mg/dL (H)). Liver Function Tests: Recent Labs  Lab 11/06/24 1220  AST 19  ALT 11  ALKPHOS 139*  BILITOT 0.5  PROT 7.2  ALBUMIN  4.0   No results for input(s): LIPASE, AMYLASE in the last 168 hours. No results for input(s): AMMONIA in the last 168 hours. Coagulation Profile: No results for input(s): INR, PROTIME in the last 168 hours. Cardiac Enzymes: No results for input(s): CKTOTAL, CKMB, CKMBINDEX, TROPONINI in the last 168 hours. BNP (last 3 results) No results for input(s): PROBNP in the last 8760 hours. HbA1C: Recent Labs    11/06/24 1713  HGBA1C 9.1*   CBG: Recent Labs  Lab 11/08/24 0721 11/08/24 1147 11/08/24 1651 11/08/24 2204 11/09/24 0819  GLUCAP 191* 244* 293* 226* 92   Lipid Profile: No results for input(s): CHOL, HDL, LDLCALC, TRIG, CHOLHDL, LDLDIRECT in the last 72 hours. Thyroid Function Tests: No results for input(s): TSH, T4TOTAL, FREET4, T3FREE, THYROIDAB in the last 72 hours. Anemia Panel: No results for input(s): VITAMINB12, FOLATE, FERRITIN, TIBC, IRON, RETICCTPCT in the last 72 hours. Sepsis Labs: Recent Labs  Lab 11/06/24 1226  11/06/24 1320 11/06/24 1418  LATICACIDVEN 1.0 1.6 1.5    Recent Results (from the past 240 hours)  Blood culture (routine x 2)     Status: None (Preliminary result)   Collection Time: 11/06/24 12:50 PM   Specimen: BLOOD RIGHT ARM  Result Value Ref Range Status   Specimen Description   Final    BLOOD RIGHT ARM Performed at [facility], [address], [place], [place] [address]    Special Requests   Final    BOTTLES DRAWN AEROBIC AND ANAEROBIC Blood Culture results may not be optimal due to an inadequate volume of blood received in culture bottles Performed at [facility], [address]  [place], [place] [address]    Culture   Final    NO GROWTH 3 DAYS Performed at [facility], [address], [place], [place] [address]    Report Status PENDING  Incomplete  Blood culture (routine x 2)     Status: None (Preliminary result)   Collection Time: 11/06/24  1:00 PM   Specimen: BLOOD RIGHT HAND  Result Value Ref Range Status   Specimen Description   Final    BLOOD RIGHT HAND Performed at [facility], [address], [place], [place] [address]    Special Requests   Final    BOTTLES DRAWN AEROBIC AND ANAEROBIC Blood Culture results may not be optimal due to an inadequate volume of blood received in culture bottles Performed at [facility], [address] [place], [place] [address]    Culture   Final    NO GROWTH 3 DAYS Performed at [facility], [address] [place], [place] [address]    Report Status PENDING  Incomplete         Radiology Studies: VAS US  ABI WITH/WO TBI Result Date: 11/08/2024  LOWER EXTREMITY DOPPLER STUDY Patient Name:  Samantha [name]  Date of Exam:   11/07/2024 Medical Rec #: [id number]            Accession #:    [id number] Date of Birth: June 26, 1951            Patient Gender: F Patient Age:   73 years Exam Location:  [facility] Procedure:      VAS US  ABI WITH/WO TBI Referring  Phys: TORIBIO DOOR --------------------------------------------------------------------------------  Indications: Ulceration, and peripheral artery disease. critical limb ischemia High Risk Factors: Diabetes.  Vascular Interventions: Atherectomy and angioplasty of the left SAF and above                         thee knee popliteal artery on 5.19.2016. Comparison Study: No prior exams on file. Performing Technologist: Edilia Elden Appl  Examination Guidelines: A complete evaluation includes at minimum, Doppler waveform signals and systolic blood pressure reading at the level of bilateral brachial, anterior tibial, and posterior tibial arteries, when vessel segments are accessible. Bilateral testing is considered an integral part of a complete examination. Photoelectric Plethysmograph (PPG) waveforms and toe systolic pressure readings are included as required and additional duplex testing as needed. Limited examinations for reoccurring indications may be performed as noted.  ABI Findings: +---------+------------------+-----+---------+--------+ Right    Rt Pressure (mmHg)IndexWaveform Comment  +---------+------------------+-----+---------+--------+ Brachial 157                    triphasic         +---------+------------------+-----+---------+--------+ PTA      255               1.62 biphasic          +---------+------------------+-----+---------+--------+ DP       162               1.03 biphasic          +---------+------------------+-----+---------+--------+ Great Toe102               0.65 Normal            +---------+------------------+-----+---------+--------+ +---------+------------------+-----+----------+-----------+ Left     Lt Pressure (mmHg)IndexWaveform  Comment     +---------+------------------+-----+----------+-----------+ Brachial  Restricted. +---------+------------------+-----+----------+-----------+ PTA      93                 0.59 monophasic            +---------+------------------+-----+----------+-----------+ DP       152               0.97 monophasic            +---------+------------------+-----+----------+-----------+ Great Toe101               0.64 Normal                +---------+------------------+-----+----------+-----------+ +-------+-----------+-----------+------------+------------+ ABI/TBIToday's ABIToday's TBIPrevious ABIPrevious TBI +-------+-----------+-----------+------------+------------+ Right  1.03       0.65                                +-------+-----------+-----------+------------+------------+ Left   0.97       0.64                                +-------+-----------+-----------+------------+------------+ Arterial wall calcification precludes accurate ankle pressures and ABIs.  Summary: Right: Resting right ankle-brachial index indicates noncompressible right lower extremity arteries. The right toe-brachial index is abnormal. Right toe pressure is >60 mmHg which suggests adequate perfusion for healing. Left: Resting left ankle-brachial index is within normal range. The left toe-brachial index is abnormal. Left toe pressure is >60 mmHg which suggests adequate perfusion for healing. *See table(s) above for measurements and observations.  Electronically signed by Lonni Gaskins MD on 11/08/2024 at 10:24:32 AM.    Final            LOS: 2 days   Time spent= 35 mins    Burgess JAYSON Dare, MD [facility]  If 7PM-7AM, please contact night-coverage  11/09/2024, 8:55 AM

## 2024-11-09 NOTE — Progress Notes (Signed)
  Daily Progress Note  CLI of the left lower extremity with tissue loss  Subjective: No complaints this morning  Objective: Vitals:   11/09/24 0518 11/09/24 0700  BP: (!) 143/83 133/61  Pulse: 78 71  Resp:  13  Temp: 98.4 F (36.9 C)   SpO2: 100% 100%    Physical Examination Nonpalpable pedal pulses, palpable femoral pulses Nonlabored breathing Regular rate Cellulitis improved  ASSESSMENT/PLAN:  Patient is a 73 year old female with left lower extremity critical and ischemic tissue loss.  She is in need of left lower extremity angiogram in effort to define improve distal perfusion for wound healing.    After discussing the risks and benefits of angiogram, Therisa elected to proceed.    Fonda FORBES Rim MD MS [facility] [phone] 11/09/2024  8:18 AM

## 2024-11-09 NOTE — Op Note (Signed)
 Patient name: Samantha [name] MRN: [id number] DOB: [birth year] Sex: female  11/09/2024 Pre-operative Diagnosis: Critical limb ischemia left lower extremity with tissue loss Post-operative diagnosis:  Same Surgeon:  Lonni DOROTHA Gaskins, MD Procedure Performed: 1.  Ultrasound-guided access right common femoral artery 2.  Aortogram with catheter selection of aorta 3.  Left lower extremity arteriogram with catheter selection of the peroneal artery 4.  Left below-knee popliteal artery, TP trunk, and peroneal artery angioplasty (2.5 mm Sterling) 5.  Stent of the left below-knee popliteal artery and TP trunk (3 mm x 38 mm BK Esprit and post-dilated with 3 mm Jade) 6.  Mynx closure of the right common femoral artery 7.  46 minutes of monitored moderate conscious sedation time  Indications: Patient is a 73 year old female with end-stage renal disease now with a left lower extremity foot wound consistent with critical limb ischemia.  She presents for lower extremity angiogram with possible invention after risk benefits discussed.  Findings:   Aortogram showed patent infrarenal aorta with small diseased renals in the setting of end-stage renal disease.  Both of her iliacs were patent without flow-limiting stenosis.   On the left her common femoral, profunda, and SFA were patent.  The SFA was diffusely diseased without any obvious flow-limiting stenosis.  The above and below-knee popliteal artery was patent.  Below the knee she had occluded trifurcation with high grade >80% stenosis in the BK popliteal artery and second lesion in the TP trunk.  Distally she did reconstitute peroneal artery through collaterals but this was occluded in the proximal to mid segment with a CTO.  The peroneal at the foot did fill the AT.  Ultimately I was able to get antegrade through her left BK pop and TP trunk disease into the peroneal artery through the occluded segment.  This was predilated with a 2.5 mm Sterling  from the below-knee popliteal artery, TP trunk and peroneal artery.  I then elected to stent the significant calcified disease in the below-knee popliteal artery and TP trunk with a 3 mm x 38 mm BK Esprit postdilated with a 3 mm Jade.  Widely patent stent at completion with inline flow down the peroneal now patent to the foot.   Procedure:  The patient was identified in the holding area and taken to room 8.  The patient was then placed supine on the table and prepped and draped in the usual sterile fashion.  A time out was called.  Patient received Versed  and fentanyl  for conscious moderate sedation.  Vital signs are monitored including heart rate, respiratory rate, oxygenation and blood pressure.  I was present for all moderate sedation.  Ultrasound was used to evaluate the right common femoral artery.  It was patent .  A digital ultrasound image was acquired.  A micropuncture needle was used to access the right common femoral artery under ultrasound guidance.  An 018 wire was advanced without resistance and a micropuncture sheath was placed.  The 018 wire was removed and a benson wire was placed.  The micropuncture sheath was exchanged for a 5 french sheath.  An omniflush catheter was advanced over the wire to the level of L-1.  An abdominal angiogram was obtained.  Next, using the omniflush catheter and a benson wire, the aortic bifurcation was crossed and the catheter was placed into theleft external iliac artery and left runoff was obtained.  Ultimately I elected for intervention.  I used a Bentson wire down the left SFA to exchanged for a 5  French catapult sheath in the right groin over the aortic bifurcation.  The patient was given 100 units/kg IV heparin .  I then used a V18 wire with a long quick cross catheter to get down through the SFA popliteal disease into the left TP trunk.  I then tried to get down the peroneal artery and initially had difficulty given I was in a chronic total occlusion.  I  ultimately used a number of wires to get into the peroneal and confirmed with hand-injection I was in the true lumen.  I then selected a 2.5 mm x 150 mm coyote balloon that was inflated in the peroneal TP trunk and below knee popliteal artery.  I then elected to stent the distal below-knee popliteal artery TP trunk disease given near subtotal occlusion with heavy calcification.  This was predilated with a 3 mm Jade and is stented with a 3 mm x 38 mm BK Esprit.  Postdilated with 3 mm balloon.  Widely patent stent at completion with preserved runoff in the peroneal now inline to the foot.  Wires and catheters were removed.  Short 5 sheath placed in the right groin.  Mynx closure deployed.  Plan: Patient is optimized.  Now inline flow down the peroneal artery with single-vessel runoff.  Aspirin  statin Plavix   Lonni DOROTHA Gaskins, MD [facility] Office: [phone]

## 2024-11-09 NOTE — Anesthesia Preprocedure Evaluation (Signed)
 Anesthesia Evaluation  Patient identified by MRN, date of birth, ID band Patient awake    Reviewed: Allergy & Precautions, NPO status , Patient's Chart, lab work & pertinent test results  History of Anesthesia Complications Negative for: history of anesthetic complications  Airway Mallampati: II  TM Distance: >3 FB Neck ROM: Full    Dental no notable dental hx. (+) Teeth Intact, Dental Advisory Given   Pulmonary neg pulmonary ROS   Pulmonary exam normal breath sounds clear to auscultation       Cardiovascular hypertension, Pt. on medications and Pt. on home beta blockers + Peripheral Vascular Disease  Normal cardiovascular exam Rhythm:Regular Rate:Normal     Neuro/Psych  PSYCHIATRIC DISORDERS Anxiety Depression    CVA    GI/Hepatic   Endo/Other  diabetes, Type 2    Renal/GU ESRF and DialysisRenal diseaseTTS Dialysis     Musculoskeletal   Abdominal   Peds  Hematology  (+) Blood dyscrasia, anemia On heparin   Lab Results      Component                Value               Date                     HGB                      9.6 (L)             11/07/2024                HCT                      26.9 (L)            11/07/2024                  PLT                      256                 11/07/2024                Anesthesia Other Findings Osteomyelitis L foot  Reproductive/Obstetrics                              Anesthesia Physical Anesthesia Plan  ASA: 4  Anesthesia Plan: MAC   Post-op Pain Management:    Induction:   PONV Risk Score and Plan: Propofol  infusion, Treatment may vary due to age or medical condition and Ondansetron   Airway Management Planned: Nasal Cannula and Natural Airway  Additional Equipment:   Intra-op Plan:   Post-operative Plan:   Informed Consent: I have reviewed the patients History and Physical, chart, labs and discussed the procedure including the  risks, benefits and alternatives for the proposed anesthesia with the patient or authorized representative who has indicated his/her understanding and acceptance.     Dental advisory given  Plan Discussed with: CRNA  Anesthesia Plan Comments: (Mac +)         Anesthesia Quick Evaluation

## 2024-11-09 NOTE — Progress Notes (Signed)
 [facility] Progress Note   Subjective:   Seen in room - feels ok today. S/p short HD last night, off. No CP/dyspnea. Plan is for LE angiography today, followed by toe amputation on Friday.  Objective Vitals:   11/09/24 0200 11/09/24 0215 11/09/24 0518 11/09/24 0700  BP:  (!) 141/57 (!) 143/83 133/61  Pulse: 67 67 78 71  Resp: 12 13  13   Temp:  98.1 F (36.7 C) 98.4 F (36.9 C)   TempSrc:  Oral    SpO2: 100% 100% 100% 100%  Weight:      Height:       Physical Exam General: Well appearing woman, NAD. Room air Heart: RRR Lungs: CTAB Abdomen: soft Extremities: no LE edema; L foot with 4th toe ulceration, erythema much improved Dialysis Access: LUE AVF +t/b    Additional Objective Labs: Basic Metabolic Panel: Recent Labs  Lab 11/07/24 0239 11/08/24 0221 11/09/24 0611  NA 136 135 132*  K 4.4 4.1 3.9  CL 101 96* 94*  CO2 18* 24 27  GLUCOSE 243* 178* 97  BUN 33* 22 15  CREATININE 3.73* 3.70* 3.45*  CALCIUM  9.4 8.6* 8.6*   Liver Function Tests: Recent Labs  Lab 11/06/24 1220  AST 19  ALT 11  ALKPHOS 139*  BILITOT 0.5  PROT 7.2  ALBUMIN  4.0   CBC: Recent Labs  Lab 11/06/24 1220 11/07/24 0239 11/07/24 2329  WBC 8.2 6.9 9.7  NEUTROABS 6.1  --   --   HGB 9.8* 11.1* 9.6*  HCT 29.5* 32.2* 26.9*  MCV 103.5* 102.5* 99.6  PLT 246 241 256   Blood Culture    Component Value Date/Time   SDES  11/06/2024 1300    BLOOD RIGHT HAND Performed at [facility], [address], [place], [place] [address]    SPECREQUEST  11/06/2024 1300    BOTTLES DRAWN AEROBIC AND ANAEROBIC Blood Culture results may not be optimal due to an inadequate volume of blood received in culture bottles Performed at [facility], [address], [place], [place] [address]    CULT  11/06/2024 1300    NO GROWTH 3 DAYS Performed at [facility], [address], [place], [place] [address]    REPTSTATUS PENDING 11/06/2024 1300    Studies/Results: VAS US  ABI WITH/WO TBI Result Date: 11/08/2024  LOWER EXTREMITY DOPPLER STUDY Patient Name:  Samantha [name]  Date of Exam:   11/07/2024 Medical Rec #: [id number]            Accession #:    [id number] Date of Birth: [birth year]            Patient Gender: F Patient Age:   73 years Exam Location:  [facility] Procedure:      VAS US  ABI WITH/WO TBI Referring Phys: TORIBIO DOOR --------------------------------------------------------------------------------  Indications: Ulceration, and peripheral artery disease. critical limb ischemia High Risk Factors: Diabetes.  Vascular Interventions: Atherectomy and angioplasty of the left SAF and above                         thee knee popliteal artery on 5.19.2016. Comparison Study: No prior exams on file. Performing Technologist: Edilia Elden Appl  Examination Guidelines: A complete evaluation includes at minimum, Doppler waveform signals and systolic blood pressure reading at the level of bilateral brachial, anterior tibial, and posterior tibial arteries, when vessel segments are accessible. Bilateral testing is considered an integral part of a complete  examination. Photoelectric Plethysmograph (PPG) waveforms and toe systolic pressure readings are included as required and additional duplex testing as needed. Limited examinations for reoccurring indications may be performed as noted.  ABI Findings: +---------+------------------+-----+---------+--------+ Right    Rt Pressure (mmHg)IndexWaveform Comment  +---------+------------------+-----+---------+--------+ Brachial 157                    triphasic         +---------+------------------+-----+---------+--------+ PTA      255               1.62 biphasic          +---------+------------------+-----+---------+--------+ DP       162               1.03 biphasic          +---------+------------------+-----+---------+--------+ Great Toe102               0.65 Normal             +---------+------------------+-----+---------+--------+ +---------+------------------+-----+----------+-----------+ Left     Lt Pressure (mmHg)IndexWaveform  Comment     +---------+------------------+-----+----------+-----------+ Brachial                                  Restricted. +---------+------------------+-----+----------+-----------+ PTA      93                0.59 monophasic            +---------+------------------+-----+----------+-----------+ DP       152               0.97 monophasic            +---------+------------------+-----+----------+-----------+ Great Toe101               0.64 Normal                +---------+------------------+-----+----------+-----------+ +-------+-----------+-----------+------------+------------+ ABI/TBIToday's ABIToday's TBIPrevious ABIPrevious TBI +-------+-----------+-----------+------------+------------+ Right  1.03       0.65                                +-------+-----------+-----------+------------+------------+ Left   0.97       0.64                                +-------+-----------+-----------+------------+------------+ Arterial wall calcification precludes accurate ankle pressures and ABIs.  Summary: Right: Resting right ankle-brachial index indicates noncompressible right lower extremity arteries. The right toe-brachial index is abnormal. Right toe pressure is >60 mmHg which suggests adequate perfusion for healing. Left: Resting left ankle-brachial index is within normal range. The left toe-brachial index is abnormal. Left toe pressure is >60 mmHg which suggests adequate perfusion for healing. *See table(s) above for measurements and observations.  Electronically signed by Lonni Gaskins MD on 11/08/2024 at 10:24:32 AM.    Final    Medications:  ampicillin-sulbactam (UNASYN) IV 3 g (11/09/24 0631)    brimonidine   1 drop Right Eye BID   carvedilol   6.25 mg Oral BID WC   Chlorhexidine  Gluconate Cloth   6 each Topical Q0600   clopidogrel   75 mg Oral Daily   dorzolamide -timolol   1 drop Right Eye BID   enoxaparin  (LOVENOX ) injection  30 mg Subcutaneous Q24H   insulin  aspart  0-5 Units Subcutaneous QHS   insulin  aspart  0-6 Units Subcutaneous TID  WC   insulin  glargine-yfgn  25 Units Subcutaneous QHS   latanoprost   1 drop Right Eye QHS   linezolid  600 mg Oral Q12H   lisinopril   2.5 mg Oral Daily   living well with diabetes book- in spanish   Does not apply Once   sevelamer  carbonate  800 mg Oral TID WC   sodium chloride  flush  3 mL Intravenous Q12H   sodium chloride  flush  3 mL Intravenous Q12H    Dialysis Orders TTS - [facility] ([address]) 3.5hrs, 350/600, EDW 63kg, 2K/2.5Ca bath, AVF, no heparin  - Epogen 1200 units q HD - no VDRA   Assessment/Plan: L 4th toe infection/osteomyelitis: On Unasyn + linezolid. Initially refusing vascular intervention/amputation, now agreeable and scheduled for LE angiogram 11/13 and toe amputation on Friday 11/14. ESRD: Usual TTS schedule - pt had requested not to have HD on same day as procedure. Next HD Sat 11/15. HTN/volume: BP good, no LE edema and syncopal episode with HD on 11/11, cautious UF goals. 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 Continue home sevelamer  T2DM   Izetta Boehringer, PA-C 11/09/2024, 9:44 AM  [employer]

## 2024-11-09 NOTE — Plan of Care (Signed)
 Patient calm and cooperative A&O X4 Spanish speaking only. 4th toe on left foot presents with wound, open to air. Patient received dialysis this shift. Patient left resting with call bell in reach and bed in lowest position.  Problem: Education: Goal: Ability to describe self-care measures that may prevent or decrease complications (Diabetes Survival Skills Education) will improve Outcome: Progressing   Problem: Coping: Goal: Ability to adjust to condition or change in health will improve Outcome: Progressing   Problem: Fluid Volume: Goal: Ability to maintain a balanced intake and output will improve Outcome: Progressing   Problem: Health Behavior/Discharge Planning: Goal: Ability to identify and utilize available resources and services will improve Outcome: Progressing   Problem: Metabolic: Goal: Ability to maintain appropriate glucose levels will improve Outcome: Progressing   Problem: Nutritional: Goal: Maintenance of adequate nutrition will improve Outcome: Progressing   Problem: Skin Integrity: Goal: Risk for impaired skin integrity will decrease Outcome: Progressing   Problem: Education: Goal: Knowledge of General Education information will improve Description: Including pain rating scale, medication(s)/side effects and non-pharmacologic comfort measures Outcome: Progressing   Problem: Health Behavior/Discharge Planning: Goal: Ability to manage health-related needs will improve Outcome: Progressing

## 2024-11-10 ENCOUNTER — Inpatient Hospital Stay (HOSPITAL_COMMUNITY)

## 2024-11-10 ENCOUNTER — Other Ambulatory Visit (HOSPITAL_COMMUNITY): Payer: Self-pay

## 2024-11-10 ENCOUNTER — Inpatient Hospital Stay (HOSPITAL_COMMUNITY): Payer: Self-pay | Admitting: Anesthesiology

## 2024-11-10 ENCOUNTER — Encounter (HOSPITAL_COMMUNITY): Payer: Self-pay | Admitting: Family Medicine

## 2024-11-10 ENCOUNTER — Encounter (HOSPITAL_COMMUNITY)
Admission: EM | Disposition: A | Discharge status: Home / Self Care | Payer: Self-pay | Source: Ambulatory Visit | Attending: Internal Medicine

## 2024-11-10 DIAGNOSIS — M869 Osteomyelitis, unspecified: Secondary | ICD-10-CM

## 2024-11-10 DIAGNOSIS — I70222 Atherosclerosis of native arteries of extremities with rest pain, left leg: Secondary | ICD-10-CM

## 2024-11-10 DIAGNOSIS — F418 Other specified anxiety disorders: Secondary | ICD-10-CM

## 2024-11-10 DIAGNOSIS — E11628 Type 2 diabetes mellitus with other skin complications: Secondary | ICD-10-CM | POA: Diagnosis not present

## 2024-11-10 DIAGNOSIS — Z95820 Peripheral vascular angioplasty status with implants and grafts: Secondary | ICD-10-CM | POA: Diagnosis not present

## 2024-11-10 DIAGNOSIS — E1169 Type 2 diabetes mellitus with other specified complication: Secondary | ICD-10-CM

## 2024-11-10 DIAGNOSIS — I10 Essential (primary) hypertension: Secondary | ICD-10-CM

## 2024-11-10 DIAGNOSIS — L089 Local infection of the skin and subcutaneous tissue, unspecified: Secondary | ICD-10-CM | POA: Diagnosis not present

## 2024-11-10 HISTORY — PX: AMPUTATION TOE: SHX6595

## 2024-11-10 LAB — GLUCOSE, CAPILLARY
Glucose-Capillary: 114 mg/dL — ABNORMAL HIGH (ref 70–99)
Glucose-Capillary: 100 mg/dL — ABNORMAL HIGH (ref 70–99)
Glucose-Capillary: 257 mg/dL — ABNORMAL HIGH (ref 70–99)
Glucose-Capillary: 353 mg/dL — ABNORMAL HIGH (ref 70–99)

## 2024-11-10 LAB — BASIC METABOLIC PANEL WITH GFR
Anion gap: 12 (ref 5–15)
BUN: 27 mg/dL — ABNORMAL HIGH (ref 8–23)
CO2: 23 mmol/L (ref 22–32)
Calcium: 8.4 mg/dL — ABNORMAL LOW (ref 8.9–10.3)
Chloride: 96 mmol/L — ABNORMAL LOW (ref 98–111)
Creatinine, Ser: 5.12 mg/dL — ABNORMAL HIGH (ref 0.44–1.00)
GFR, Estimated: 8 mL/min — ABNORMAL LOW (ref >60)
Glucose, Bld: 142 mg/dL — ABNORMAL HIGH (ref 70–99)
Potassium: 3.9 mmol/L (ref 3.5–5.1)
Sodium: 131 mmol/L — ABNORMAL LOW (ref 135–145)

## 2024-11-10 LAB — LIPID PANEL
Cholesterol: 89 mg/dL (ref 0–200)
HDL: 32 mg/dL — ABNORMAL LOW (ref >40)
LDL Cholesterol: 25 mg/dL (ref 0–99)
Total CHOL/HDL Ratio: 2.8 ratio
Triglycerides: 158 mg/dL — ABNORMAL HIGH (ref <150)
VLDL: 32 mg/dL (ref 0–40)

## 2024-11-10 LAB — CBC
HCT: 26.5 % — ABNORMAL LOW (ref 36.0–46.0)
Hemoglobin: 9.2 g/dL — ABNORMAL LOW (ref 12.0–15.0)
MCH: 35.5 pg — ABNORMAL HIGH (ref 26.0–34.0)
MCHC: 34.7 g/dL (ref 30.0–36.0)
MCV: 102.3 fL — ABNORMAL HIGH (ref 80.0–100.0)
Platelets: 247 K/uL (ref 150–400)
RBC: 2.59 MIL/uL — ABNORMAL LOW (ref 3.87–5.11)
RDW: 12.5 % (ref 11.5–15.5)
WBC: 8 K/uL (ref 4.0–10.5)
nRBC: 0 % (ref 0.0–0.2)

## 2024-11-10 SURGERY — AMPUTATION, TOE
Anesthesia: Monitor Anesthesia Care | Site: Toe | Laterality: Left

## 2024-11-10 MED ORDER — CHLORHEXIDINE GLUCONATE 0.12 % MT SOLN
15.0000 mL | Freq: Once | OROMUCOSAL | Status: AC
  Administered 2024-11-10: 15 mL via OROMUCOSAL

## 2024-11-10 MED ORDER — JUVEN PO PACK: 1.0000 | PACK | Freq: Two times a day (BID) | ORAL | Status: DC

## 2024-11-10 MED ORDER — ORAL CARE MOUTH RINSE: 15.0000 mL | Freq: Once | OROMUCOSAL | Status: AC

## 2024-11-10 MED ORDER — LATANOPROST 0.005 % OP SOLN: 1.0000 [drp] | Freq: Every day | OPHTHALMIC | Status: AC

## 2024-11-10 MED ORDER — AMOXICILLIN-POT CLAVULANATE 500-125 MG PO TABS
1.0000 | ORAL_TABLET | Freq: Every day | ORAL | 0 refills | Status: AC
  Filled 2024-11-10: qty 5, 5d supply, fill #0

## 2024-11-10 MED ORDER — INSULIN ASPART 100 UNIT/ML IJ SOLN: 0.0000 [IU] | INTRAMUSCULAR | Status: DC | PRN

## 2024-11-10 MED ORDER — NEPRO/CARBSTEADY PO LIQD: 237.0000 mL | Freq: Two times a day (BID) | ORAL | Status: DC

## 2024-11-10 MED ORDER — DEXAMETHASONE SOD PHOSPHATE PF 10 MG/ML IJ SOLN
INTRAMUSCULAR | Status: DC | PRN
  Administered 2024-11-10: 5 mg via INTRAVENOUS

## 2024-11-10 MED ORDER — PROPOFOL 500 MG/50ML IV EMUL
INTRAVENOUS | Status: DC | PRN
  Administered 2024-11-10: 50 ug/kg/min via INTRAVENOUS

## 2024-11-10 MED ORDER — ONDANSETRON HCL 4 MG/2ML IJ SOLN
INTRAMUSCULAR | Status: DC | PRN
  Administered 2024-11-10: 4 mg via INTRAVENOUS

## 2024-11-10 MED ORDER — ONDANSETRON HCL 4 MG/2ML IJ SOLN: 4.0000 mg | Freq: Once | INTRAMUSCULAR | Status: DC | PRN

## 2024-11-10 MED ORDER — FENTANYL CITRATE (PF) 100 MCG/2ML IJ SOLN
INTRAMUSCULAR | Status: AC
  Filled 2024-11-10: qty 2

## 2024-11-10 MED ORDER — MIDAZOLAM HCL (PF) 2 MG/2ML IJ SOLN
INTRAMUSCULAR | Status: DC | PRN
  Administered 2024-11-10: 2 mg via INTRAVENOUS

## 2024-11-10 MED ORDER — INSULIN GLARGINE 100 UNIT/ML SOLOSTAR PEN: 20.0000 [IU] | PEN_INJECTOR | Freq: Every day | SUBCUTANEOUS | Status: AC

## 2024-11-10 MED ORDER — BUPIVACAINE HCL (PF) 0.5 % IJ SOLN
INTRAMUSCULAR | Status: AC
  Filled 2024-11-10: qty 30

## 2024-11-10 MED ORDER — LIDOCAINE 2% (20 MG/ML) 5 ML SYRINGE
INTRAMUSCULAR | Status: AC
  Filled 2024-11-10: qty 5

## 2024-11-10 MED ORDER — SODIUM CHLORIDE 0.9 % IR SOLN
Status: DC | PRN
  Administered 2024-11-10: 1

## 2024-11-10 MED ORDER — LIDOCAINE HCL (PF) 1 % IJ SOLN
INTRAMUSCULAR | Status: DC | PRN
  Administered 2024-11-10: 5 mL

## 2024-11-10 MED ORDER — ASPIRIN 81 MG PO TBEC
81.0000 mg | DELAYED_RELEASE_TABLET | Freq: Every day | ORAL | 0 refills | Status: AC
  Filled 2024-11-10: qty 90, 90d supply, fill #0

## 2024-11-10 MED ORDER — SODIUM CHLORIDE 0.9 % IV SOLN
INTRAVENOUS | Status: DC | PRN
  Administered 2024-11-10: 3 g via INTRAVENOUS

## 2024-11-10 MED ORDER — BUPIVACAINE HCL (PF) 0.5 % IJ SOLN
INTRAMUSCULAR | Status: DC | PRN
  Administered 2024-11-10: 5 mL

## 2024-11-10 MED ORDER — BRIMONIDINE TARTRATE 0.2 % OP SOLN: 1.0000 [drp] | Freq: Two times a day (BID) | OPHTHALMIC | Status: AC

## 2024-11-10 MED ORDER — ACETAMINOPHEN 10 MG/ML IV SOLN: 1000.0000 mg | Freq: Once | INTRAVENOUS | Status: DC | PRN

## 2024-11-10 MED ORDER — PROPOFOL 10 MG/ML IV BOLUS
INTRAVENOUS | Status: AC
Start: 2024-11-10 — End: 2024-11-10
  Filled 2024-11-10: qty 20

## 2024-11-10 MED ORDER — ONDANSETRON HCL 4 MG/2ML IJ SOLN
INTRAMUSCULAR | Status: AC
  Filled 2024-11-10: qty 2

## 2024-11-10 MED ORDER — FENTANYL CITRATE (PF) 100 MCG/2ML IJ SOLN: 25.0000 ug | INTRAMUSCULAR | Status: DC | PRN

## 2024-11-10 MED ORDER — RENA-VITE PO TABS: 1.0000 | ORAL_TABLET | Freq: Every day | ORAL | Status: DC

## 2024-11-10 MED ORDER — SODIUM CHLORIDE 0.9 % IV SOLN: INTRAVENOUS | Status: DC

## 2024-11-10 MED ORDER — FENTANYL CITRATE (PF) 100 MCG/2ML IJ SOLN
INTRAMUSCULAR | Status: DC | PRN
  Administered 2024-11-10: 25 ug via INTRAVENOUS

## 2024-11-10 MED ORDER — LIDOCAINE HCL (PF) 1 % IJ SOLN
INTRAMUSCULAR | Status: AC
  Filled 2024-11-10: qty 30

## 2024-11-10 MED ORDER — MIDAZOLAM HCL 2 MG/2ML IJ SOLN
INTRAMUSCULAR | Status: AC
  Filled 2024-11-10: qty 2

## 2024-11-10 MED ORDER — DORZOLAMIDE HCL-TIMOLOL MAL 2-0.5 % OP SOLN: 1.0000 [drp] | Freq: Two times a day (BID) | OPHTHALMIC | Status: AC

## 2024-11-10 SURGICAL SUPPLY — 19 items
BNDG ELASTIC 3INX 5YD STR LF (GAUZE/BANDAGES/DRESSINGS) ×1 IMPLANT
BNDG ELASTIC 4INX 5YD STR LF (GAUZE/BANDAGES/DRESSINGS) IMPLANT
BNDG GAUZE DERMACEA FLUFF 4 (GAUZE/BANDAGES/DRESSINGS) IMPLANT
DRSG XEROFORM 1X8 (GAUZE/BANDAGES/DRESSINGS) IMPLANT
ELECTRODE REM PT RTRN 9FT ADLT (ELECTROSURGICAL) ×1 IMPLANT
GAUZE SPONGE 4X4 12PLY STRL (GAUZE/BANDAGES/DRESSINGS) ×1 IMPLANT
GLOVE BIO SURGEON STRL SZ7.5 (GLOVE) ×1 IMPLANT
GLOVE BIOGEL PI IND STRL 7.5 (GLOVE) ×1 IMPLANT
GOWN STRL REUS W/ TWL LRG LVL3 (GOWN DISPOSABLE) ×2 IMPLANT
KIT BASIN OR (CUSTOM PROCEDURE TRAY) ×1 IMPLANT
NDL HYPO 25X1 1.5 SAFETY (NEEDLE) ×1 IMPLANT
NEEDLE HYPO 25X1 1.5 SAFETY (NEEDLE) ×1 IMPLANT
PACK ORTHO EXTREMITY (CUSTOM PROCEDURE TRAY) ×1 IMPLANT
SOLN STERILE WATER BTL 1000 ML (IV SOLUTION) ×1 IMPLANT
SUT PROLENE 3 0 PS 2 (SUTURE) IMPLANT
SYR CONTROL 10ML LL (SYRINGE) ×1 IMPLANT
TUBE CONNECTING 12X1/4 (SUCTIONS) IMPLANT
UNDERPAD 30X36 HEAVY ABSORB (UNDERPADS AND DIAPERS) ×1 IMPLANT
YANKAUER SUCT BULB TIP NO VENT (SUCTIONS) IMPLANT

## 2024-11-10 NOTE — Anesthesia Postprocedure Evaluation (Signed)
 Anesthesia Post Note  Patient: Samantha [name]  Procedure(s) Performed: LEFT FOURTH TOE AMPUTATION (Left: Toe)     Patient location during evaluation: PACU Anesthesia Type: MAC Level of consciousness: awake and alert Pain management: pain level controlled Vital Signs Assessment: post-procedure vital signs reviewed and stable Respiratory status: spontaneous breathing, nonlabored ventilation, respiratory function stable and patient connected to nasal cannula oxygen Cardiovascular status: stable and blood pressure returned to baseline Postop Assessment: no apparent nausea or vomiting Anesthetic complications: no   No notable events documented.  Last Vitals:  Vitals:   11/10/24 0945 11/10/24 1119  BP: (!) 142/62 (!) 148/54  Pulse: 73 93  Resp: 16 20  Temp: 36.7 C 37 C  SpO2: 99% 97%    Last Pain:  Vitals:   11/10/24 1119  TempSrc: Oral  PainSc:                  Garnette DELENA Gab

## 2024-11-10 NOTE — Progress Notes (Signed)
 History and Physical Interval Note:  11/10/2024 6:47 AM  Samantha [name]  has presented today for surgery, with the diagnosis of gangrene and osteomyelitis of distal left fourth toe.  The various methods of treatment have been discussed with the patient and family. After consideration of risks, benefits and other options for treatment, the patient has consented to   Procedure(s) with comments: AMPUTATION, TOE (Left) - L 4th toe amputation as a surgical intervention.  The patient's history has been reviewed, patient examined, no change in status, stable for surgery.  I have reviewed the patient's chart and labs.  Questions were answered to the patient's satisfaction.     Marsa FALCON Shama Monfils

## 2024-11-10 NOTE — Progress Notes (Signed)
 Unable to schedule follow up appointment with Dr. Malvin. Pt states she can not go Thursday 11/16/24 due to HD. Left voicemail for MD office. Pt and family aware and will follow up Monday. Ann Nena Hoard, RN

## 2024-11-10 NOTE — Op Note (Signed)
 Full Operative Report  Date of Operation: 7:39 AM, 11/10/2024   Patient: Samantha [name] - 73 y.o. female  Surgeon: Malvin Marsa FALCON, DPM   Assistant: None  Diagnosis: osteomyelitis of fourth toe left foot  Procedure:  1.  Amputation of fourth toe and mid proximal phalanx level, left foot    Anesthesia: Monitor Anesthesia Care  Jefm Garnette LABOR, MD  Anesthesiologist: Jefm Garnette LABOR, MD CRNA: Elby Raelene SAUNDERS, CRNA   Estimated Blood Loss: Minimal   Hemostasis: 1) Anatomical dissection, mechanical compression, electrocautery 2) no tourniquet was used during procedure  Implants: * No implants in log *  Materials: Prolene 3-0  Injectables: 1) Pre-operatively: 10 cc of 50:50 mixture 1%lidocaine  plain and 0.5% marcaine  plain 2) Post-operatively: None   Specimens: - Pathology: Left fourth toe - Microbiology: None   Antibiotics: IV antibiotics given per schedule on the floor  Drains: None  Complications: Patient tolerated the procedure well without complication.   Operative findings: As below in detailed report  Indications for Procedure: Samantha [name] presents to [facility] Marsa FALCON, DPM with a chief complaint of chronic ulceration concern for necrosis of distal aspect of left fourth toe MRI concern for osteomyelitis of distal phalanx.  The patient has failed conservative treatments of various modalities. At this time the patient has elected to proceed with surgical correction. All alternatives, risks, and complications of the procedures were thoroughly explained to the patient. Patient exhibits appropriate understanding of all discussion points and informed consent was signed and obtained in the chart with no guarantees to surgical outcome given or implied.  Description of Procedure: Patient was brought to the operating room. Patient remained on their hospital bed in the supine position. A surgical timeout was performed and all members of the  operating room, the procedure, and the surgical site were identified. anesthesia occurred as per anesthesia record. Local anesthetic as previously described was then injected about the operative field in a local infiltrative block.  The operative lower extremity as noted above was then prepped and draped in the usual sterile manner. The following procedure then began.  Attention was directed to the Fourth digit on the LEFT foot. A full-thickness incision encompassing the entire digit was made using a #15 blade. Dissection was carried down to bone. The toe was secured with a towel clamp, further dissected in its entirety, and then a bone cutting forceps was used to make a transverse osteotomy through the mid proximal phalanx level of the fourth toe which was then resected and passed to the back table as a gross specimen. This was then labled and sent to pathology. The bone was noted to be soft and eroded, and consistent with osteomyelitis. All remaining necrotic and devitalized soft tissue structures were visualized and dissected away using sharp and dull dissection. Care was taken to protect all neurovascular structures throughout the dissection. All bleeders were cauterized as necessary. The area was then flushed with copious amounts of sterile saline. Then using the suture materials previously described, the site was closed in anatomic layers and the skin was well approximated under minimal tension.   The surgical site was then dressed with Xeroform 4 x 4 gauze Kerlix Ace wrap. The patient tolerated both the procedure and anesthesia well with vital signs stable throughout. The patient was transferred in good condition and all vital signs stable  from the OR to recovery under the discretion of anesthesia.  Condition: Vital signs stable, neurovascular status unchanged from preoperative   Surgical plan:  Expect clean  margin.  Good bleeding noted intraoperatively.  Recommend 5 days of Augmentin  on discharge.   Leave surgical dressing clean dry and intact follow-up next week Thursday.  The patient will be weightbearing as tolerated in a postop shoe to the operative limb until further instructed. The dressing is to remain clean, dry, and intact. Will continue to follow unless noted elsewhere.   [name], DPM [facility]

## 2024-11-10 NOTE — Progress Notes (Addendum)
 Pt for possible d/c today per attending. Contacted [facility] and advised staff that pt is for possible d/c later today. Clinic advised pt should resume tomorrow if d/c later today. Clinic agreeable to contact hospital in the morning to see if pt was d/c since clinic staff will be leaving shortly for the end of the day. Will assist as needed.   Randine Mungo Dialysis Navigator [phone]  Addendum at 5:58 pm: D/C order noted. D/C summary, today's renal note, and Op note faxed to clinic for continuation of care and per their request.

## 2024-11-10 NOTE — Progress Notes (Signed)
 [facility] Progress Note   Subjective:   Seen in room - feels ok today. S/p LE angiogram/plasty on 11/12, then toe amputation this AM. No CP/dyspnea. She is feeling well and asking about discharge today.  Objective Vitals:   11/10/24 0830 11/10/24 0840 11/10/24 0945 11/10/24 1119  BP: (!) 120/53 (!) 126/50 (!) 142/62 (!) 148/54  Pulse: 72 74 73 93  Resp: 14 19 16 20   Temp:  98.1 F (36.7 C) 98.1 F (36.7 C) 98.6 F (37 C)  TempSrc:   Oral Oral  SpO2: 94% 94% 99% 97%  Weight:      Height:       Physical Exam General: Well appearing woman, NAD, Room air Heart: RRR Lungs: CTAB Abdomen: soft Extremities: no LE edema, L foot bandaged Dialysis Access: LUE AVF +t/b  Additional Objective Labs: Basic Metabolic Panel: Recent Labs  Lab 11/08/24 0221 11/09/24 0611 11/10/24 0241  NA 135 132* 131*  K 4.1 3.9 3.9  CL 96* 94* 96*  CO2 24 27 23   GLUCOSE 178* 97 142*  BUN 22 15 27*  CREATININE 3.70* 3.45* 5.12*  CALCIUM  8.6* 8.6* 8.4*   Liver Function Tests: Recent Labs  Lab 11/06/24 1220  AST 19  ALT 11  ALKPHOS 139*  BILITOT 0.5  PROT 7.2  ALBUMIN  4.0   CBC: Recent Labs  Lab 11/06/24 1220 11/07/24 0239 11/07/24 2329 11/10/24 0241  WBC 8.2 6.9 9.7 8.0  NEUTROABS 6.1  --   --   --   HGB 9.8* 11.1* 9.6* 9.2*  HCT 29.5* 32.2* 26.9* 26.5*  MCV 103.5* 102.5* 99.6 102.3*  PLT 246 241 256 247   Studies/Results: DG Foot 2 Views Left Result Date: 11/10/2024 CLINICAL DATA:  Postop in PACU. EXAM: LEFT FOOT - 2 VIEW COMPARISON:  Radiograph 11/06/2024 FINDINGS: Resection of the fourth toe at the mid aspect of the proximal phalanx. The resection margin is smooth. Expected postoperative changes in the operative bed soft tissues. The remainder the exam is unchanged. Plantar calcaneal spur. Peripheral vascular calcifications. IMPRESSION: Resection of the fourth toe at the mid aspect of the proximal phalanx. Electronically Signed   By: Andrea Gasman M.D.    On: 11/10/2024 11:42   PERIPHERAL VASCULAR CATHETERIZATION Result Date: 11/09/2024 Images from the original result were not included.   Patient name: Samantha [name]    MRN: [id number]        DOB: September 14, 1951          Sex: female  11/09/2024 Pre-operative Diagnosis: Critical limb ischemia left lower extremity with tissue loss Post-operative diagnosis:  Same Surgeon:  Lonni DOROTHA Gaskins, MD Procedure Performed: 1.  Ultrasound-guided access right common femoral artery 2.  Aortogram with catheter selection of aorta 3.  Left lower extremity arteriogram with catheter selection of the peroneal artery 4.  Left below-knee popliteal artery, TP trunk, and peroneal artery angioplasty (2.5 mm Sterling) 5.  Stent of the left below-knee popliteal artery and TP trunk (3 mm x 38 mm BK Esprit and post-dilated with 3 mm Jade) 6.  Mynx closure of the right common femoral artery 7.  46 minutes of monitored moderate conscious sedation time  Indications: Patient is a 73 year old female with end-stage renal disease now with a left lower extremity foot wound consistent with critical limb ischemia.  She presents for lower extremity angiogram with possible invention after risk benefits discussed.  Findings:  Aortogram showed patent infrarenal aorta with small diseased renals in the setting of end-stage renal disease.  Both  of her iliacs were patent without flow-limiting stenosis.  On the left her common femoral, profunda, and SFA were patent.  The SFA was diffusely diseased without any obvious flow-limiting stenosis.  The above and below-knee popliteal artery was patent.  Below the knee she had occluded trifurcation with high grade >80% stenosis in the BK popliteal artery and second lesion in the TP trunk.  Distally she did reconstitute peroneal artery through collaterals but this was occluded in the proximal to mid segment with a CTO.  The peroneal at the foot did fill the AT.  Ultimately I was able to get antegrade through her left  BK pop and TP trunk disease into the peroneal artery through the occluded segment.  This was predilated with a 2.5 mm Sterling from the below-knee popliteal artery, TP trunk and peroneal artery.  I then elected to stent the significant calcified disease in the below-knee popliteal artery and TP trunk with a 3 mm x 38 mm BK Esprit postdilated with a 3 mm Jade.  Widely patent stent at completion with inline flow down the peroneal now patent to the foot.             Procedure:  The patient was identified in the holding area and taken to room 8.  The patient was then placed supine on the table and prepped and draped in the usual sterile fashion.  A time out was called.  Patient received Versed  and fentanyl  for conscious moderate sedation.  Vital signs are monitored including heart rate, respiratory rate, oxygenation and blood pressure.  I was present for all moderate sedation.  Ultrasound was used to evaluate the right common femoral artery.  It was patent .  A digital ultrasound image was acquired.  A micropuncture needle was used to access the right common femoral artery under ultrasound guidance.  An 018 wire was advanced without resistance and a micropuncture sheath was placed.  The 018 wire was removed and a benson wire was placed.  The micropuncture sheath was exchanged for a 5 french sheath.  An omniflush catheter was advanced over the wire to the level of L-1.  An abdominal angiogram was obtained.  Next, using the omniflush catheter and a benson wire, the aortic bifurcation was crossed and the catheter was placed into theleft external iliac artery and left runoff was obtained.  Ultimately I elected for intervention.  I used a Bentson wire down the left SFA to exchanged for a 5 French catapult sheath in the right groin over the aortic bifurcation.  The patient was given 100 units/kg IV heparin .  I then used a V18 wire with a long quick cross catheter to get down through the SFA popliteal disease into the left TP  trunk.  I then tried to get down the peroneal artery and initially had difficulty given I was in a chronic total occlusion.  I ultimately used a number of wires to get into the peroneal and confirmed with hand-injection I was in the true lumen.  I then selected a 2.5 mm x 150 mm coyote balloon that was inflated in the peroneal TP trunk and below knee popliteal artery.  I then elected to stent the distal below-knee popliteal artery TP trunk disease given near subtotal occlusion with heavy calcification.  This was predilated with a 3 mm Jade and is stented with a 3 mm x 38 mm BK Esprit.  Postdilated with 3 mm balloon.  Widely patent stent at completion with preserved runoff in the peroneal  now inline to the foot.  Wires and catheters were removed.  Short 5 sheath placed in the right groin.  Mynx closure deployed.  Plan: Patient is optimized.  Now inline flow down the peroneal artery with single-vessel runoff.  Aspirin  statin Plavix   Lonni DOROTHA Gaskins, MD [facility] Office: [phone]    Medications:  ampicillin-sulbactam (UNASYN) IV Stopped (11/09/24 1809)    aspirin  EC  81 mg Oral Daily   atorvastatin   40 mg Oral Daily   brimonidine   1 drop Right Eye BID   carvedilol   6.25 mg Oral BID WC   Chlorhexidine  Gluconate Cloth  6 each Topical Q0600   clopidogrel   75 mg Oral Daily   dorzolamide -timolol   1 drop Right Eye BID   enoxaparin  (LOVENOX ) injection  30 mg Subcutaneous Q24H   feeding supplement (NEPRO CARB STEADY)  237 mL Oral BID BM   insulin  aspart  0-5 Units Subcutaneous QHS   insulin  aspart  0-6 Units Subcutaneous TID WC   insulin  glargine-yfgn  25 Units Subcutaneous QHS   latanoprost   1 drop Right Eye QHS   linezolid  600 mg Oral Q12H   lisinopril   2.5 mg Oral Daily   living well with diabetes book- in spanish   Does not apply Once   multivitamin  1 tablet Oral QHS   nutrition supplement (JUVEN)  1 packet Oral BID BM   sevelamer  carbonate  800 mg Oral  TID WC   sodium chloride  flush  3 mL Intravenous Q12H   sodium chloride  flush  3 mL Intravenous Q12H   sodium chloride  flush  3 mL Intravenous Q12H    Dialysis Orders TTS - [facility] ([address]) 3.5hrs, 350/600, EDW 63kg, 2K/2.5Ca bath, AVF, no heparin  - Epogen 1200 units q HD - no VDRA   Assessment/Plan: L 4th toe infection/osteomyelitis: On Unasyn + linezolid. S/p LE angiogram 11/13 and toe amputation Friday 11/14. ESRD: Usual TTS schedule - for HD tomorrow, either here or as outpatient. HTN/volume: BP decent, no LE edema and syncopal episode with HD on 11/11, cautious UF goals. Anemia of ESRD: Hgb 9.2 - resume ESA as outpatient. Secondary HPTH: Ca ok, Phos pending. Continue home sevelamer  T2DM   Izetta Boehringer, PA-C 11/10/2024, 2:52 PM  [employer]

## 2024-11-10 NOTE — Progress Notes (Signed)
 [facility]  Subjective  -no complaints   Objective (!) 148/54 93 98.6 F (37 C) (Oral) 20 97%  Intake/Output Summary (Last 24 hours) at 11/10/2024 1217 Last data filed at 11/10/2024 0839 Gross per 24 hour  Intake 342.24 ml  Output 2 ml  Net 340.24 ml    Right groin without hematoma Brisk left multiphasic peroneal signal at the ankle  Laboratory Lab Results: Recent Labs    11/07/24 2329 11/10/24 0241  WBC 9.7 8.0  HGB 9.6* 9.2*  HCT 26.9* 26.5*  PLT 256 247   BMET Recent Labs    11/09/24 0611 11/10/24 0241  NA 132* 131*  K 3.9 3.9  CL 94* 96*  CO2 27 23  GLUCOSE 97 142*  BUN 15 27*  CREATININE 3.45* 5.12*  CALCIUM  8.6* 8.4*    COAG Lab Results  Component Value Date   INR 1.1 05/17/2023   INR 1.0 11/08/2021   INR 0.91 05/16/2015   No results found for: PTT  Assessment/Planning:  73 year old female that underwent left lower extremity intervention including TP trunk peroneal angioplasty with below-knee popliteal TP trunk stent yesterday for critical limb ischemia with tissue loss.  Right groin looks good at the access site.  Brisk multiphasic peroneal signal at the ankle.  Optimized.  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  Aspirin  statin Plavix  from a vascular surgery standpoint.  Will arrange follow-up in 4 to 6 weeks with duplex imaging.  Lonni JINNY Gaskins 11/10/2024 12:17 PM --

## 2024-11-10 NOTE — Progress Notes (Signed)
 Mobility Specialist Progress Note:   11/10/24 1050  Mobility  Activity Ambulated with assistance  Level of Assistance Minimal assist, patient does 75% or more  Assistive Device Front wheel walker  Distance Ambulated (ft) 15 ft  LLE Weight Bearing Per Provider Order WBAT  Activity Response Tolerated well  Mobility Referral Yes  Mobility visit 1 Mobility  Mobility Specialist Start Time (ACUTE ONLY) 1050  Mobility Specialist Stop Time (ACUTE ONLY) 1055  Mobility Specialist Time Calculation (min) (ACUTE ONLY) 5 min   Pt requested to use bathroom. LLE post op shoe donned. Required MinA to stand with RW. Tolerated well. Linens changed. NT notified. Left with all needs met, encouraged to use call bell when finished.   Samantha [name] Mobility Specialist Please contact via Special Educational Needs Teacher or  Rehab office at [phone]

## 2024-11-10 NOTE — Progress Notes (Signed)
 Orthopedic Tech Progress Note Patient Details:  Samantha [name] [birth year] [id number]  Ortho Devices Type of Ortho Device: Postop shoe/boot Ortho Device/Splint Interventions: Ordered, Application   Post Interventions Patient Tolerated: Well  Adine MARLA Blush 11/10/2024, 9:08 AM

## 2024-11-10 NOTE — Transfer of Care (Signed)
 Immediate Anesthesia Transfer of Care Note  Patient: Samantha [name]  Procedure(s) Performed: AMPUTATION, TOE (Left: Toe)  Patient Location: PACU  Anesthesia Type:MAC  Level of Consciousness: awake, alert , and oriented  Airway & Oxygen Therapy: Patient Spontanous Breathing and Patient connected to face mask oxygen  Post-op Assessment: Report given to RN and Post -op Vital signs reviewed and stable  Post vital signs: Reviewed and stable  Last Vitals:  Vitals Value Taken Time  BP 109/52 11/10/24 08:11  Temp    Pulse 75 11/10/24 08:13  Resp 19 11/10/24 08:13  SpO2 95 % 11/10/24 08:13  Vitals shown include unfiled device data.  Last Pain:  Vitals:   11/10/24 0700  TempSrc: Oral  PainSc:       Patients Stated Pain Goal: 0 (11/09/24 0908)  Complications: No notable events documented.

## 2024-11-10 NOTE — Progress Notes (Signed)
 Pt/family given discharge instructions, medication lists, follow up appointments, and when to call the doctor.  Pt/family verbalizes understanding. Pt given signs and symptoms of infection. Medications from [facility] delivered to room. Interpreter for all instructions. Ann Nena Hoard, RN

## 2024-11-10 NOTE — Discharge Summary (Signed)
 Physician Discharge Summary  Samantha [name] FMW:[id number] DOB: 10-Jul-1951 DOA: 11/06/2024  PCP: Cloria Annabella CROME, DO  Admit date: 11/06/2024 Discharge date: 11/10/2024  Time spent: 40 minutes  Recommendations for Outpatient Follow-up:  Follow outpatient CBC/CMP   Follow with podiatry outpatient - follow up appt next Thursday, leave dressing intact, WBAT in post op shoe Follow with vascular outpatient - 4-6 week US  imaging Follow with renal outpatient  Discharge Diagnoses:  Principal Problem:   Diabetic foot infection (HCC) Active Problems:   Diabetic toe ulcer (HCC)   Diabetic ulcer of left foot associated with type 2 diabetes mellitus (HCC)   Diabetic neuropathy, painful (HCC)   PAD (peripheral artery disease)   Type 2 diabetes mellitus with complication, with long-term current use of insulin  (HCC)   ESRD (end stage renal disease) on dialysis (HCC)   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 Workup concerning for diabetic foot infection therefore admitted. MRI confirmed left toe osteomyelitis. ID, vascular and podiatry consulted. Now s/p angioplasty and stenting of L below knee popliteal artery and TP trunk (s/p peroneal artery angioplasty).  S/p amputation of 4th toe and mid proximal phalanx level, L foot.  Stable for discharge 11/14, see below and prior notes for additional details.   Hospital Course:  Assessment and Plan:  Left lower lobe critical ischemia with tissue loss left toe osteomyelitis Peripheral arterial disease with history of critical limb ischemia 2016 - MRI Foot with diffuse marrow edema signal in  the distal phalanx 4th toe concerning for early osteo (nonspecific edema in the tuft of the distal phalanx third toe - see report) -- s/p  angioplasty of L below knee popliteal artery, TP trunk, and peroneal artery angioplasty.  Stent of L below knee popliteal artery and TP trunk.   -- s/p amputation of 4th toe and mid proximal phalanx level, L foot 11/14 -- discussed with podiatry, ok for discharge on 5 days augmentin  -- aspirin , statin, plavix  - outpatient podiatry follow up   Diabetes mellitus type 2 uncontrolled hyperglycemia - A1c 9.1. -- continue insulin    ESRD on HD -Nephrology consulted for HD   Essential hypertension - Coreg , lisinopril    Microcytic anemia - Hb 11.1; stable.   Glaucoma - follow with ophtho      Procedures: Procedure Performed: 1.  Ultrasound-guided access right common femoral artery 2.  Aortogram with catheter selection of aorta 3.  Left lower extremity arteriogram with catheter selection of the peroneal artery 4.  Left below-knee popliteal artery, TP trunk, and peroneal artery angioplasty (2.5 mm Sterling) 5.  Stent of the left below-knee popliteal artery and TP trunk (3 mm x 38 mm BK Esprit and post-dilated with 3 mm Jade) 6.  Mynx closure of the right common femoral artery 7.  46 minutes of monitored moderate conscious sedation time   Procedure:  1.  Amputation of fourth toe and mid proximal phalanx level, left foot  Consultations: Vascular podiatry  Discharge Exam: Vitals:   11/10/24 0945 11/10/24 1119  BP: (!) 142/62 (!) 148/54  Pulse: 73 93  Resp: 16 20  Temp: 98.1 F (36.7 C) 98.6 F (  37 C)  SpO2: 99% 97%   Asking for help getting to bathroom  General: No acute distress. Cardiovascular: RRR Lungs: unlabored Neurological: Alert and oriented 3. Moves all extremities 4 with equal strength. Cranial nerves II through XII grossly intact. Skin: Warm and dry. No rashes or lesions. Extremities: LLE with post op boot, dressing in  place   Discharge Instructions   Discharge Instructions     Call MD for:  difficulty breathing, headache or visual disturbances   Complete by: As directed    Call MD for:  extreme fatigue   Complete by: As directed    Call MD for:  hives   Complete by: As directed    Call MD for:  persistant dizziness or light-headedness   Complete by: As directed    Call MD for:  persistant nausea and vomiting   Complete by: As directed    Call MD for:  redness, tenderness, or signs of infection (pain, swelling, redness, odor or green/yellow discharge around incision site)   Complete by: As directed    Call MD for:  severe uncontrolled pain   Complete by: As directed    Call MD for:  temperature >100.4   Complete by: As directed    Diet - low sodium heart healthy   Complete by: As directed    Discharge instructions   Complete by: As directed    You were seen for left toe osteomyelitis and poor blood flow to that leg.  You had an angiogram with vascular.  You had an angioplasty of the popliteal artery, TP trunk, and peroneal artery.  You had [name] stent of the popliteal artery and TP trunk.  Continue aspirin , plavix , and lipitor .  You'll need follow up in 4-6 weeks with US  imaging with vascular.   You had an amputation by podiatry.  We'll send you home with 5 days of antibiotics.  Leave the dressing intact and follow up with podiatry next week on Thursday as scheduled.  You should remain in [name] post op shoe to the operative limb until further instructed.    Return for new, recurrent, or worsening symptoms.  Please ask your PCP to request records from this hospitalization so they know what was done and what the next steps will be.   Discharge wound care:   Complete by: As directed    Per podiatry   Increase activity slowly   Complete by: As directed       Allergies as of 11/10/2024       Reactions   Chicken Allergy Other (See Comments)   headache   Perflutren  Other (See Comments)   Pt  with back pain as soon as administered.- patient refuted this in 2024        Medication List     TAKE these medications    acetaminophen  325 MG tablet Commonly known as: TYLENOL  Take 1-2 tablets (325-650 mg total) by mouth every 4 (four) hours as needed for mild pain.   amoxicillin -clavulanate 500-125 MG tablet Commonly known as: Augmentin  Take 1 tablet by mouth at bedtime for 5 days.   aspirin  EC 81 MG tablet Take 1 tablet (81 mg total) by mouth daily. Swallow whole. Start taking on: November 11, 2024   atorvastatin  40 MG tablet Commonly known as: LIPITOR  Take 40 mg by mouth daily.   brimonidine  0.2 % ophthalmic solution Commonly known as: ALPHAGAN  Place 1 drop into the right eye 2 (two) times daily.   carvedilol  6.25 MG tablet Commonly known  as: COREG  Take 1 tablet (6.25 mg total) by mouth 2 (two) times daily with Surie Suchocki meal.   clopidogrel  75 MG tablet Commonly known as: PLAVIX  TAKE 1 TABLET BY MOUTH EVERY DAY What changed: when to take this   dorzolamide -timolol  2-0.5 % ophthalmic solution Commonly known as: COSOPT  Place 1 drop into the right eye 2 (two) times daily.   insulin  glargine 100 UNIT/ML Solostar Pen Commonly known as: LANTUS  Inject 20 Units into the skin at bedtime.   latanoprost  0.005 % ophthalmic solution Commonly known as: Xalatan  Place 1 drop into the right eye at bedtime.   lisinopril  2.5 MG tablet Commonly known as: ZESTRIL  Take 2.5 mg by mouth daily.   multivitamin capsule Take 1 capsule by mouth at bedtime.   ondansetron  4 MG tablet Commonly known as: ZOFRAN  Take 1 tablet (4 mg total) by mouth every 6 (six) hours as needed for nausea.   oxyCODONE  5 MG immediate release tablet Commonly known as: Oxy IR/ROXICODONE  Take 1 tablet (5 mg total) by mouth every 6 (six) hours as needed for severe pain.   Renvela  800 MG tablet Generic drug: sevelamer  carbonate TAKE 1 TABLET BY MOUTH 3 TIMES DAILY WITH MEALS.   sodium chloride  flush 0.9 %  Soln injection Flush cholecystectomy tube as directed by general surgery               Discharge Care Instructions  (From admission, onward)           Start     Ordered   11/10/24 0000  Discharge wound care:       Comments: Per podiatry   11/10/24 1639           Allergies  Allergen Reactions   Chicken Allergy Other (See Comments)    headache   Perflutren  Other (See Comments)    Pt with back pain as soon as administered.- patient refuted this in 2024      The results of significant diagnostics from this hospitalization (including imaging, microbiology, ancillary and laboratory) are listed below for reference.    Significant Diagnostic Studies: DG Foot 2 Views Left Result Date: 11/10/2024 CLINICAL DATA:  Postop in PACU. EXAM: LEFT FOOT - 2 VIEW COMPARISON:  Radiograph 11/06/2024 FINDINGS: Resection of the fourth toe at the mid aspect of the proximal phalanx. The resection margin is smooth. Expected postoperative changes in the operative bed soft tissues. The remainder the exam is unchanged. Plantar calcaneal spur. Peripheral vascular calcifications. IMPRESSION: Resection of the fourth toe at the mid aspect of the proximal phalanx. Electronically Signed   By: Andrea Gasman M.D.   On: 11/10/2024 11:42   PERIPHERAL VASCULAR CATHETERIZATION Result Date: 11/09/2024 Images from the original result were not included.   Patient name: Samantha [name]    MRN: [id number]        DOB: 1951/10/04          Sex: female  11/09/2024 Pre-operative Diagnosis: Critical limb ischemia left lower extremity with tissue loss Post-operative diagnosis:  Same Surgeon:  Lonni DOROTHA Gaskins, MD Procedure Performed: 1.  Ultrasound-guided access right common femoral artery 2.  Aortogram with catheter selection of aorta 3.  Left lower extremity arteriogram with catheter selection of the peroneal artery 4.  Left below-knee popliteal artery, TP trunk, and peroneal artery angioplasty (2.5 mm Sterling)  5.  Stent of the left below-knee popliteal artery and TP trunk (3 mm x 38 mm BK Esprit and post-dilated with 3 mm Jade) 6.  Mynx closure of the right common  femoral artery 7.  46 minutes of monitored moderate conscious sedation time  Indications: Patient is Keeton Kassebaum 73 year old female with end-stage renal disease now with Angeletta Goelz left lower extremity foot wound consistent with critical limb ischemia.  She presents for lower extremity angiogram with possible invention after risk benefits discussed.  Findings:  Aortogram showed patent infrarenal aorta with small diseased renals in the setting of end-stage renal disease.  Both of her iliacs were patent without flow-limiting stenosis.  On the left her common femoral, profunda, and SFA were patent.  The SFA was diffusely diseased without any obvious flow-limiting stenosis.  The above and below-knee popliteal artery was patent.  Below the knee she had occluded trifurcation with high grade >80% stenosis in the BK popliteal artery and second lesion in the TP trunk.  Distally she did reconstitute peroneal artery through collaterals but this was occluded in the proximal to mid segment with Antonia Culbertson CTO.  The peroneal at the foot did fill the AT.  Ultimately I was able to get antegrade through her left BK pop and TP trunk disease into the peroneal artery through the occluded segment.  This was predilated with Lynore Coscia 2.5 mm Sterling from the below-knee popliteal artery, TP trunk and peroneal artery.  I then elected to stent the significant calcified disease in the below-knee popliteal artery and TP trunk with Mose Colaizzi 3 mm x 38 mm BK Esprit postdilated with Aneliz Carbary 3 mm Jade.  Widely patent stent at completion with inline flow down the peroneal now patent to the foot.             Procedure:  The patient was identified in the holding area and taken to room 8.  The patient was then placed supine on the table and prepped and draped in the usual sterile fashion.  Yannis Broce time out was called.  Patient received Versed  and  fentanyl  for conscious moderate sedation.  Vital signs are monitored including heart rate, respiratory rate, oxygenation and blood pressure.  I was present for all moderate sedation.  Ultrasound was used to evaluate the right common femoral artery.  It was patent .  Camdyn Beske digital ultrasound image was acquired.  Amelita Risinger micropuncture needle was used to access the right common femoral artery under ultrasound guidance.  An 018 wire was advanced without resistance and Alex Leahy micropuncture sheath was placed.  The 018 wire was removed and Cristian Grieves benson wire was placed.  The micropuncture sheath was exchanged for Aurora Rody 5 french sheath.  An omniflush catheter was advanced over the wire to the level of L-1.  An abdominal angiogram was obtained.  Next, using the omniflush catheter and Jonnelle Lawniczak benson wire, the aortic bifurcation was crossed and the catheter was placed into theleft external iliac artery and left runoff was obtained.  Ultimately I elected for intervention.  I used Letishia Elliott Bentson wire down the left SFA to exchanged for Almeda Ezra 5 French catapult sheath in the right groin over the aortic bifurcation.  The patient was given 100 units/kg IV heparin .  I then used Morine Kohlman V18 wire with Estuardo Frisbee long quick cross catheter to get down through the SFA popliteal disease into the left TP trunk.  I then tried to get down the peroneal artery and initially had difficulty given I was in Treyana Sturgell chronic total occlusion.  I ultimately used Dontae Minerva number of wires to get into the peroneal and confirmed with hand-injection I was in the true lumen.  I then selected Aldo Sondgeroth 2.5 mm x 150 mm coyote balloon that was inflated  in the peroneal TP trunk and below knee popliteal artery.  I then elected to stent the distal below-knee popliteal artery TP trunk disease given near subtotal occlusion with heavy calcification.  This was predilated with Lakyn Mantione 3 mm Jade and is stented with Cleo Santucci 3 mm x 38 mm BK Esprit.  Postdilated with 3 mm balloon.  Widely patent stent at completion with preserved runoff in the peroneal now  inline to the foot.  Wires and catheters were removed.  Short 5 sheath placed in the right groin.  Mynx closure deployed.  Plan: Patient is optimized.  Now inline flow down the peroneal artery with single-vessel runoff.  Aspirin  statin Plavix   Lonni DOROTHA Gaskins, MD [facility] Office: [phone]    VAS US  ABI WITH/WO TBI Result Date: 11/08/2024  LOWER EXTREMITY DOPPLER STUDY Patient Name:  Samantha [name]  Date of Exam:   11/07/2024 Medical Rec #: [id number]            Accession #:    [id number] Date of Birth: 1951/02/14            Patient Gender: F Patient Age:   18 years Exam Location:  [facility] Procedure:      VAS US  ABI WITH/WO TBI Referring Phys: TORIBIO DOOR --------------------------------------------------------------------------------  Indications: Ulceration, and peripheral artery disease. critical limb ischemia High Risk Factors: Diabetes.  Vascular Interventions: Atherectomy and angioplasty of the left SAF and above                         thee knee popliteal artery on 5.19.2016. Comparison Study: No prior exams on file. Performing Technologist: Edilia Elden Appl  Examination Guidelines: Chiyoko Torrico complete evaluation includes at minimum, Doppler waveform signals and systolic blood pressure reading at the level of bilateral brachial, anterior tibial, and posterior tibial arteries, when vessel segments are accessible. Bilateral testing is considered an integral part of Evanna Washinton complete examination. Photoelectric Plethysmograph (PPG) waveforms and toe systolic pressure readings are included as required and additional duplex testing as needed. Limited examinations for reoccurring indications may be performed as noted.  ABI Findings: +---------+------------------+-----+---------+--------+ Right    Rt Pressure (mmHg)IndexWaveform Comment  +---------+------------------+-----+---------+--------+ Brachial 157                    triphasic          +---------+------------------+-----+---------+--------+ PTA      255               1.62 biphasic          +---------+------------------+-----+---------+--------+ DP       162               1.03 biphasic          +---------+------------------+-----+---------+--------+ Great Toe102               0.65 Normal            +---------+------------------+-----+---------+--------+ +---------+------------------+-----+----------+-----------+ Left     Lt Pressure (mmHg)IndexWaveform  Comment     +---------+------------------+-----+----------+-----------+ Brachial                                  Restricted. +---------+------------------+-----+----------+-----------+ PTA      93                0.59 monophasic            +---------+------------------+-----+----------+-----------+ DP  152               0.97 monophasic            +---------+------------------+-----+----------+-----------+ Great Toe101               0.64 Normal                +---------+------------------+-----+----------+-----------+ +-------+-----------+-----------+------------+------------+ ABI/TBIToday's ABIToday's TBIPrevious ABIPrevious TBI +-------+-----------+-----------+------------+------------+ Right  1.03       0.65                                +-------+-----------+-----------+------------+------------+ Left   0.97       0.64                                +-------+-----------+-----------+------------+------------+ Arterial wall calcification precludes accurate ankle pressures and ABIs.  Summary: Right: Resting right ankle-brachial index indicates noncompressible right lower extremity arteries. The right toe-brachial index is abnormal. Right toe pressure is >60 mmHg which suggests adequate perfusion for healing. Left: Resting left ankle-brachial index is within normal range. The left toe-brachial index is abnormal. Left toe pressure is >60 mmHg which suggests adequate perfusion for  healing. *See table(s) above for measurements and observations.  Electronically signed by Lonni Gaskins MD on 11/08/2024 at 10:24:32 AM.    Final    MR FOOT LEFT WO CONTRAST Result Date: 11/07/2024 CLINICAL DATA:  Diabetic foot swelling, clinical concern for osteomyelitis EXAM: MRI OF THE LEFT FOOT WITHOUT CONTRAST TECHNIQUE: Multiplanar, multisequence MR imaging of the left foot from the midfoot through the toes was performed. No intravenous contrast was administered. COMPARISON:  Radiographs 11/06/2024 FINDINGS: Bones/Joint/Cartilage Diffuse marrow edema signal in the distal phalanx fourth toe suspicious for early osteomyelitis. Trace edema signal in the tuft of the distal phalanx third toe is nonspecific. Ligaments Lisfranc ligament intact. Muscles and Tendons Low-level regional muscular edema is likely neurogenic. Nodular thickening of the plantar fascia on image 13 series 17 suspicious for plantar fibromatosis. Soft tissues Low-grade subcutaneous edema and overlying cutaneous thickening in the fourth toe and to Rasmus Preusser lesser extent in the second and third toes. Dorsal subcutaneous edema in the forefoot laterally. No drainable abscess observed. IMPRESSION: 1. Diffuse marrow edema signal in the distal phalanx fourth toe suspicious for early osteomyelitis. 2. Trace edema signal in the tuft of the distal phalanx third toe is nonspecific. 3. Low-grade subcutaneous edema and overlying cutaneous thickening in the fourth toe and to Harold Moncus lesser extent in the second and third toes. 4. Nodular thickening of the plantar fascia suspicious for plantar fibromatosis. 5. Low-level regional muscular edema is likely neurogenic. Electronically Signed   By: Ryan Salvage M.D.   On: 11/07/2024 08:18   DG Foot Complete Left Result Date: 11/06/2024 CLINICAL DATA:  Concern for osteomyelitis. EXAM: LEFT FOOT - COMPLETE 3+ VIEW COMPARISON:  Left foot radiograph dated 01/16/2016. FINDINGS: No acute fracture or dislocation. Mild  osteopenia. No significant arthritic changes. No bone erosion or periosteal elevation. Vascular calcifications noted. The soft tissues are unremarkable IMPRESSION: 1. No acute fracture or dislocation. 2. No radiographic evidence of osteomyelitis. Electronically Signed   By: Vanetta Chou M.D.   On: 11/06/2024 13:43    Microbiology: Recent Results (from the past 240 hours)  Blood culture (routine x 2)     Status: None (Preliminary result)   Collection Time: 11/06/24 12:50 PM   Specimen:  BLOOD RIGHT ARM  Result Value Ref Range Status   Specimen Description   Final    BLOOD RIGHT ARM Performed at [facility], [address], [place], [place] [address]    Special Requests   Final    BOTTLES DRAWN AEROBIC AND ANAEROBIC Blood Culture results may not be optimal due to an inadequate volume of blood received in culture bottles Performed at [facility], [address] [place], [place] [address]    Culture   Final    NO GROWTH 4 DAYS Performed at [facility], [address], [place], [place] [address]    Report Status PENDING  Incomplete  Blood culture (routine x 2)     Status: None (Preliminary result)   Collection Time: 11/06/24  1:00 PM   Specimen: BLOOD RIGHT HAND  Result Value Ref Range Status   Specimen Description   Final    BLOOD RIGHT HAND Performed at [facility], [address], [place], [place] [address]    Special Requests   Final    BOTTLES DRAWN AEROBIC AND ANAEROBIC Blood Culture results may not be optimal due to an inadequate volume of blood received in culture bottles Performed at [facility], [address] [place], [place] [address]    Culture   Final    NO GROWTH 4 DAYS Performed at [facility], [address], [place], [place] [address]    Report Status PENDING  Incomplete     Labs: Basic Metabolic Panel: Recent Labs  Lab 11/06/24 1220 11/07/24 0239 11/08/24 0221 11/09/24 0611  11/10/24 0241  NA 137 136 135 132* 131*  K 3.7 4.4 4.1 3.9 3.9  CL 100 101 96* 94* 96*  CO2 24 18* 24 27 23   GLUCOSE 292* 243* 178* 97 142*  BUN 31* 33* 22 15 27*  CREATININE 3.56* 3.73* 3.70* 3.45* 5.12*  CALCIUM  9.7 9.4 8.6* 8.6* 8.4*  MG  --   --  2.0  --   --    Liver Function Tests: Recent Labs  Lab 11/06/24 1220  AST 19  ALT 11  ALKPHOS 139*  BILITOT 0.5  PROT 7.2  ALBUMIN  4.0   No results for input(s): LIPASE, AMYLASE in the last 168 hours. No results for input(s): AMMONIA in the last 168 hours. CBC: Recent Labs  Lab 11/06/24 1220 11/07/24 0239 11/07/24 2329 11/10/24 0241  WBC 8.2 6.9 9.7 8.0  NEUTROABS 6.1  --   --   --   HGB 9.8* 11.1* 9.6* 9.2*  HCT 29.5* 32.2* 26.9* 26.5*  MCV 103.5* 102.5* 99.6 102.3*  PLT 246 241 256 247   Cardiac Enzymes: No results for input(s): CKTOTAL, CKMB, CKMBINDEX, TROPONINI in the last 168 hours. BNP: BNP (last 3 results) No results for input(s): BNP in the last 8760 hours.  ProBNP (last 3 results) No results for input(s): PROBNP in the last 8760 hours.  CBG: Recent Labs  Lab 11/09/24 1602 11/09/24 2111 11/10/24 0604 11/10/24 0815 11/10/24 1122  GLUCAP 93 145* 114* 100* 257*       Signed:  Meliton Monte MD.  [facility] 11/10/2024, 4:39 PM

## 2024-11-11 ENCOUNTER — Encounter (HOSPITAL_COMMUNITY): Payer: Self-pay | Admitting: Podiatry

## 2024-11-11 LAB — CULTURE, BLOOD (ROUTINE X 2)
Culture: NO GROWTH
Culture: NO GROWTH

## 2024-11-13 LAB — SURGICAL PATHOLOGY

## 2024-11-15 ENCOUNTER — Other Ambulatory Visit: Payer: Self-pay | Admitting: Vascular Surgery

## 2024-11-15 DIAGNOSIS — I739 Peripheral vascular disease, unspecified: Secondary | ICD-10-CM

## 2024-11-17 ENCOUNTER — Ambulatory Visit (INDEPENDENT_AMBULATORY_CARE_PROVIDER_SITE_OTHER): Payer: Self-pay | Admitting: Podiatry

## 2024-11-17 DIAGNOSIS — Z89422 Acquired absence of other left toe(s): Secondary | ICD-10-CM

## 2024-11-17 NOTE — Progress Notes (Signed)
 Subjective:  Patient ID: Samantha [name], female    DOB: 05-Apr-1951,  MRN: [id number]  Chief Complaint  Patient presents with   Routine Post Op    POV#1- DOS 11/10/24-AMPUTATION, TOE (Left) - L 4th toe amputation as a surgical intervention    DOS: 11/10/2024 Procedure: Left fourth digit amputation  73 y.o. female returns for post-op check.  She states she is doing well no complaints.  Healing well.  Denies any other acute issues 0 out of 10 pain scale  Review of Systems: Negative except as noted in the HPI. Denies N/V/F/Ch.  Past Medical History:  Diagnosis Date   Acute pyelonephritis    Bacteremia, escherichia coli 01/12/2015   Chest pain    Critical lower limb ischemia (HCC)    ESRD (end stage renal disease) (HCC)    Hyperlipidemia    Hypertension    Left carotid bruit    PAD (peripheral artery disease)    Stroke (HCC)    Type II diabetes mellitus (HCC)     Current Outpatient Medications:    acetaminophen  (TYLENOL ) 325 MG tablet, Take 1-2 tablets (325-650 mg total) by mouth every 4 (four) hours as needed for mild pain., Disp: , Rfl:    aspirin  EC 81 MG tablet, Take 1 tablet (81 mg total) by mouth daily. Swallow whole., Disp: 90 tablet, Rfl: 0   atorvastatin  (LIPITOR ) 40 MG tablet, Take 40 mg by mouth daily., Disp: , Rfl:    brimonidine  (ALPHAGAN ) 0.2 % ophthalmic solution, Place 1 drop into the right eye 2 (two) times daily., Disp: , Rfl:    carvedilol  (COREG ) 6.25 MG tablet, Take 1 tablet (6.25 mg total) by mouth 2 (two) times daily with a meal., Disp: 180 tablet, Rfl: 2   clopidogrel  (PLAVIX ) 75 MG tablet, TAKE 1 TABLET BY MOUTH EVERY DAY (Patient taking differently: Take 75 mg by mouth at bedtime.), Disp: 90 tablet, Rfl: 1   dorzolamide -timolol  (COSOPT ) 2-0.5 % ophthalmic solution, Place 1 drop into the right eye 2 (two) times daily., Disp: , Rfl:    insulin  glargine (LANTUS ) 100 UNIT/ML Solostar Pen, Inject 20 Units into the skin at bedtime., Disp: , Rfl:     latanoprost  (XALATAN ) 0.005 % ophthalmic solution, Place 1 drop into the right eye at bedtime., Disp: , Rfl:    lisinopril  (ZESTRIL ) 2.5 MG tablet, Take 2.5 mg by mouth daily., Disp: , Rfl:    Multiple Vitamin (MULTIVITAMIN) capsule, Take 1 capsule by mouth at bedtime., Disp: , Rfl:    ondansetron  (ZOFRAN ) 4 MG tablet, Take 1 tablet (4 mg total) by mouth every 6 (six) hours as needed for nausea., Disp: 15 tablet, Rfl: 0   oxyCODONE  (OXY IR/ROXICODONE ) 5 MG immediate release tablet, Take 1 tablet (5 mg total) by mouth every 6 (six) hours as needed for severe pain., Disp: 15 tablet, Rfl: 0   sevelamer  carbonate (RENVELA ) 800 MG tablet, TAKE 1 TABLET BY MOUTH 3 TIMES DAILY WITH MEALS., Disp: 270 tablet, Rfl: 1   sodium chloride  flush 0.9 % SOLN injection, Flush cholecystectomy tube as directed by general surgery, Disp: 10 mL, Rfl: 10  Social History   Tobacco Use  Smoking Status Never  Smokeless Tobacco Never    Allergies  Allergen Reactions   Chicken Allergy Other (See Comments)    headache   Perflutren  Other (See Comments)    Pt with back pain as soon as administered.- patient refuted this in 2024   Objective:  There were no vitals filed for this visit. There  is no height or weight on file to calculate BMI. Constitutional Well developed. Well nourished.  Vascular Foot warm and well perfused. Capillary refill normal to all digits.   Neurologic Normal speech. Oriented to person, place, and time. Epicritic sensation to light touch grossly present bilaterally.  Dermatologic Skin healing well without signs of infection. Skin edges well coapted without signs of infection.  Orthopedic: Tenderness to palpation noted about the surgical site.   Radiographs: None Assessment:   1. History of amputation of left fourth toe    Plan:  Patient was evaluated and treated and all questions answered.  S/p foot surgery left -Progressing as expected post-operatively. -XR: See above -WB Status:  Weightbearing as tolerated in surgical shoe -Sutures: Intact.  No clinical signs of dehiscence no complication. -Medications: None -Foot redressed.  No follow-ups on file.

## 2024-11-21 ENCOUNTER — Encounter: Payer: Self-pay | Admitting: Vascular Surgery

## 2024-11-27 NOTE — Progress Notes (Shared)
 [facility] - Clinic Note  12/11/2024   CHIEF COMPLAINT Patient presents for Retina Follow Up  HISTORY OF PRESENT ILLNESS: Samantha [name] is a 73 y.o. female who presents to the clinic today for:  HPI     Retina Follow Up   Patient presents with  Diabetic Retinopathy.  In left eye.  Severity is moderate.  Duration of 10 weeks.  Since onset it is stable.        Comments   10 week Retina eval. Patient states vision is still blurry. Blood sugar 140      Last edited by German Olam BRAVO, COT on 12/11/2024  1:25 PM.     Pt states no eye paid OD. A little blurry in OS.   Referring physician: Cloria Annabella CROME, DO [address] [place],  [place] [address]  HISTORICAL INFORMATION:  Selected notes from the MEDICAL RECORD NUMBER Referred by [facility]: Dec 08, 2023 at [facility] Ocular Hx- previously managed by [facility] -- h/o anti-VEGF therapy OS x4, last injection was IVE OS on 12.11.24 PMH-   CURRENT MEDICATIONS: Current Outpatient Medications (Ophthalmic Drugs)  Medication Sig   brimonidine  (ALPHAGAN ) 0.2 % ophthalmic solution Place 1 drop into the right eye 2 (two) times daily.   dorzolamide -timolol  (COSOPT ) 2-0.5 % ophthalmic solution Place 1 drop into the right eye 2 (two) times daily.   latanoprost  (XALATAN ) 0.005 % ophthalmic solution Place 1 drop into the right eye at bedtime.   No current facility-administered medications for this visit. (Ophthalmic Drugs)   Current Outpatient Medications (Other)  Medication Sig   acetaminophen  (TYLENOL ) 325 MG tablet Take 1-2 tablets (325-650 mg total) by mouth every 4 (four) hours as needed for mild pain.   aspirin  EC 81 MG tablet Take 1 tablet (81 mg total) by mouth daily. Swallow whole.   atorvastatin  (LIPITOR ) 40 MG tablet Take 40 mg by mouth daily.   carvedilol  (COREG ) 6.25 MG tablet Take 1 tablet (6.25 mg total) by mouth 2 (two) times daily with a meal.   clopidogrel  (PLAVIX ) 75 MG  tablet TAKE 1 TABLET BY MOUTH EVERY DAY   insulin  glargine (LANTUS ) 100 UNIT/ML Solostar Pen Inject 20 Units into the skin at bedtime.   lisinopril  (ZESTRIL ) 2.5 MG tablet Take 2.5 mg by mouth daily.   Multiple Vitamin (MULTIVITAMIN) capsule Take 1 capsule by mouth at bedtime.   ondansetron  (ZOFRAN ) 4 MG tablet Take 1 tablet (4 mg total) by mouth every 6 (six) hours as needed for nausea.   oxyCODONE  (OXY IR/ROXICODONE ) 5 MG immediate release tablet Take 1 tablet (5 mg total) by mouth every 6 (six) hours as needed for severe pain.   sevelamer  carbonate (RENVELA ) 800 MG tablet TAKE 1 TABLET BY MOUTH 3 TIMES DAILY WITH MEALS.   sodium chloride  flush 0.9 % SOLN injection Flush cholecystectomy tube as directed by general surgery   No current facility-administered medications for this visit. (Other)   REVIEW OF SYSTEMS: ROS   Positive for: Endocrine, Eyes Negative for: Constitutional, Gastrointestinal, Neurological, Skin, Genitourinary, Musculoskeletal, HENT, Cardiovascular, Respiratory, Psychiatric, Allergic/Imm, Heme/Lymph Last edited by German Olam BRAVO, COT on 12/11/2024  1:14 PM.        ALLERGIES Allergies  Allergen Reactions   Chicken Allergy Other (See Comments)    headache   Perflutren  Other (See Comments)    Pt with back pain as soon as administered.- patient refuted this in 2024   PAST MEDICAL HISTORY Past Medical History:  Diagnosis Date   Acute pyelonephritis    Bacteremia, escherichia coli 01/12/2015   Chest pain    Critical lower limb ischemia (HCC)    ESRD (end stage renal disease) (HCC)    Hyperlipidemia    Hypertension    Left carotid bruit    PAD (peripheral artery disease)    Stroke (HCC)    Type II diabetes mellitus (HCC)    Past Surgical History:  Procedure Laterality Date   ABDOMINAL ANGIOGRAM  05/16/2015   Procedure: Abdominal Angiogram;  Surgeon: Dorn JINNY Lesches, MD;  Location: [facility]AS[facility];  Service: Cardiovascular;;   ABDOMINAL HYSTERECTOMY  ~  2000   AMPUTATION TOE Left 11/10/2024   Procedure: LEFT FOURTH TOE AMPUTATION;  Surgeon: Malvin Marsa FALCON, DPM;  Location: [facility] OR;  Service: Orthopedics/Podiatry;  Laterality: Left;   CHOLECYSTECTOMY N/A 09/23/2023   Procedure: LAPAROSCOPIC CHOLECYSTECTOMY;  Surgeon: Kinsinger, Herlene Righter, MD;  Location: [facility] OR;  Service: General;  Laterality: N/A;   IR EXCHANGE BILIARY DRAIN  07/26/2023   IR FLUORO GUIDE CV LINE RIGHT  05/17/2023   IR PERC CHOLECYSTOSTOMY  05/23/2023   IR US  GUIDE VASC ACCESS RIGHT  05/17/2023   LOWER EXTREMITY ANGIOGRAPHY N/A 11/09/2024   Procedure: Lower Extremity Angiography;  Surgeon: Gretta Lonni JINNY, MD;  Location: [facility] INVASIVE CV LAB;  Service: Cardiovascular;  Laterality: N/A;   LOWER EXTREMITY INTERVENTION Left 11/09/2024   Procedure: LOWER EXTREMITY INTERVENTION;  Surgeon: Gretta Lonni JINNY, MD;  Location: [facility];  Service: Cardiovascular;  Laterality: Left;  Pop, TP Trunk, Peroneal   PERIPHERAL VASCULAR CATHETERIZATION Bilateral 05/16/2015   Procedure: Lower Extremity Angiography;  Surgeon: Dorn JINNY Lesches, MD; renal arteries widely patent, L-SFA 75%, L-pop 95%, L-peroneal 90%; R-SFA 40%, R-pop 50%, R-prox peroneal 90%; directional atherectomy L-SFA, p-pop, drug-eluting balloon angioplasty reducing the stenoses to 0, small linear dissection was not flow limiting       PERIPHERAL VASCULAR CATHETERIZATION Left 05/16/2015   Procedure: Peripheral Vascular Atherectomy;  Surgeon: Dorn JINNY Lesches, MD;  Location: [facility];  Service: Cardiovascular;  Laterality: Left;  sfa   FAMILY HISTORY Family History  Problem Relation Age of Onset   Hypertension Mother    SOCIAL HISTORY Social History   Tobacco Use   Smoking status: Never   Smokeless tobacco: Never  Vaping Use   Vaping status: Never Used  Substance Use Topics   Alcohol  use: Not Currently    Alcohol /week: 1.0 - 2.0 standard drink of alcohol     Types: 1 - 2 Cans of beer per  week    Comment: occas   Drug use: No       OPHTHALMIC EXAM:  Base Eye Exam     Visual Acuity (Snellen - Linear)       Right Left   Dist [place] 20/LP 20/40 -2   Dist ph [place]  20/25 +1         Tonometry (Tonopen, 1:24 PM)       Right Left   Pressure 38,52 13         Pupils       Dark Light Shape React APD   Right 4 3 Round NR APD   Left 3 2 Round Brisk None         Visual Fields       Left Right    Full    Restrictions  Total superior temporal, inferior temporal, superior nasal, inferior nasal deficiencies  Extraocular Movement       Right Left    Full, Ortho Full, Ortho         Neuro/Psych     Oriented x3: Yes   Mood/Affect: Normal         Dilation     Both eyes: 1.0% Mydriacyl, 2.5% Phenylephrine @ 1:24 PM           Slit Lamp and Fundus Exam     Slit Lamp Exam       Right Left   Lids/Lashes Dermatochalasis - upper lid Dermatochalasis - upper lid   Conjunctiva/Sclera temporal pinguecula nasal and temporal pinguecula, mild melanosis   Cornea trace PEE, mild tear film debris well healed cataract wound, mild tear film debris   Anterior Chamber moderate depth, closed angles deep and clear   Iris Round and dilated, +NVI Round and dilated, No NVI   Lens 3-4+ Nuclear sclerosis with brunescence, 3-4+ Cortical cataract, 3+ Posterior subcapsular cataract PC IOL in good position, trace posterior capsular opacification   Anterior Vitreous Vitreous syneresis mild syneresis, PVD, Vitreous condensation         Fundus Exam       Right Left   Disc No view 2+ Pallor, Sharp rim, mild PPA   C/D Ratio  0.3   Macula No view Flat, Good foveal reflex, scattered MA greatest temporal, +cystic changes   Vessels No view attenuated, Tortuous, focal sclerosis temporal macula   Periphery No view Attached, rare MA           IMAGING AND PROCEDURES  Imaging and Procedures for 12/11/2024           ASSESSMENT/PLAN:   ICD-10-CM   1. 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 Neovascular glaucoma of right eye, severe stage  H40.51X3     4. 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 Current use of insulin  (HCC)  Z79.4     6. Long-term (current) use of injectable non-insulin  antidiabetic drugs  Z79.85     7. Hypertensive retinopathy of both eyes  H35.033     8. Essential hypertension  I10     9. Combined forms of age-related cataract of right eye  H25.811     10. Pseudophakia  Z96.1        1-6. Proliferative diabetic retinopathy OD        Severe NPDR OS  **delayed f/u: 3 mos instead of 4 wks (07.14.25 to 10.10.25)**  - previously managed at [facility] -- referred here by [facility] due to insurance  - h/o IVE OS x4, last injection 12.11.24 w/ Dr. Tobie  - here s/p IVA OU #1 (02.05.25) #2(03.10.25), #3 (04.07.25), #4 (05.05.25) -- IVA resistance  - s/p IVE OU #1 (06.02.25) - s/p IVE OS #2 (07.14.25), #3 (10.10 25) - initial exam showed +NVI w/ closed angles OD, IOP OD 40+ -- +neovascular glaucoma; OS with scattered MA/DBH, no NV - FA (02.05.25) OD: no images obtained (dense cataract); OS: no NV; scattered patches of vascular nonperfusion; scattered late leaking MA -- would likely benefit from PRP OS to areas of vascular nonperfusion - OCT shows OD: Extremely poor view, retina grossly attached, severe diffuse atrophy;  OS: Persistent mild IRF/edema inferior and temporal macula at 10 weeks since last inj OS - BCVA OD LP, OS 20/25 --stable - IOP OD 25, OS 14 - pt was seen at [facility] for NVG on 6.3.25 by Dr. Atalie Thompson -- no intervention was recommended, comfort care only, follow up PRN - recommend IVE today OS #4 (12.15. 25) for DME, will hold  tx OD - pt wishes to proceed with injection OS - RBA of procedure discussed, questions answered - IVE informed consent obtained and signed, 06.02.25 (OU) - IVA informed consent obtained and signed, 02.05.25 (OU) - cont Cosopt  and Brimonidine  TID OD, latanoprost  at bedtime OD for NVG - f/u 10 wks, DFE, OCT, possible injection(s)  7,8. Hypertensive retinopathy OU - discussed importance of tight BP control  - monitor  9. Mixed Cataract OD - The symptoms of cataract, surgical options, and treatments and risks were discussed with patient. - discussed diagnosis and progression - under the expert management of Dr. Octavia  10. Pseudophakia OS  - s/p CE/IOL OS (January 2025, Dr. Octavia)  - IOL in good position, doing well  - monitor  Ophthalmic Meds Ordered this visit:  No orders of the defined types were placed in this encounter.   No follow-ups on file.  There are no Patient Instructions on file for this visit.  This document serves as a record of services personally performed by Redell JUDITHANN Hans, MD, PhD. It was created on their behalf by Avelina Pereyra, COA an ophthalmic technician. The creation of this record is the provider's dictation and/or activities during the visit.   Electronically signed by: Avelina GORMAN Pereyra, COT  12/11/2024  1:58 PM   This document serves as a record of services personally performed by Redell JUDITHANN Hans, MD, PhD. It was created on their behalf by Almetta Pesa, an ophthalmic technician. The creation of this record is the provider's dictation and/or activities during the visit.    Electronically signed by: Almetta Pesa, OA, 12/11/2024  2:02 PM   Redell JUDITHANN Hans, M.D., Ph.D. Diseases & Surgery of the Retina and Vitreous [facility] 12/11/2024  Abbreviations: M myopia (nearsighted); A astigmatism; H hyperopia (farsighted); P presbyopia; Mrx spectacle prescription;  CTL contact lenses; OD right eye; OS left eye; OU both eyes  XT  exotropia; ET esotropia; PEK punctate epithelial keratitis; PEE punctate epithelial erosions; DES dry eye syndrome; MGD meibomian gland dysfunction; ATs artificial tears; PFAT's preservative free artificial tears; NSC nuclear sclerotic cataract; PSC posterior subcapsular cataract; ERM epi-retinal membrane; PVD posterior vitreous detachment; RD retinal detachment; DM diabetes mellitus; DR diabetic retinopathy; NPDR non-proliferative diabetic retinopathy; PDR proliferative diabetic retinopathy; CSME clinically significant macular edema; DME diabetic macular edema; dbh dot blot hemorrhages; CWS cotton wool spot; POAG primary open angle glaucoma; C/D cup-to-disc ratio; HVF humphrey visual field; GVF goldmann visual field; OCT optical coherence tomography; IOP intraocular pressure; BRVO Branch retinal vein occlusion; CRVO central retinal vein occlusion; CRAO central retinal artery occlusion; BRAO branch retinal artery occlusion; RT retinal tear; SB scleral buckle; PPV pars plana vitrectomy; VH Vitreous hemorrhage; PRP panretinal laser photocoagulation; IVK intravitreal kenalog; VMT vitreomacular traction; MH Macular hole;  NVD neovascularization of the disc; NVE neovascularization elsewhere; AREDS age related eye disease study; ARMD age related macular degeneration; POAG primary open angle glaucoma; EBMD epithelial/anterior basement membrane dystrophy; ACIOL anterior chamber intraocular lens; IOL intraocular lens; PCIOL posterior chamber intraocular lens; Phaco/IOL phacoemulsification with intraocular lens placement; PRK photorefractive keratectomy; LASIK laser assisted in  situ keratomileusis; HTN hypertension; DM diabetes mellitus; COPD chronic obstructive pulmonary disease

## 2024-12-06 ENCOUNTER — Ambulatory Visit (INDEPENDENT_AMBULATORY_CARE_PROVIDER_SITE_OTHER): Payer: Self-pay | Admitting: Podiatry

## 2024-12-06 DIAGNOSIS — Z89422 Acquired absence of other left toe(s): Secondary | ICD-10-CM

## 2024-12-06 NOTE — Progress Notes (Signed)
 Subjective:  Patient ID: Samantha [name], female    DOB: 1951-06-04,  MRN: [id number]  Chief Complaint  Patient presents with   Routine Post Op    POV#2- DOS 11/10/24-AMPUTATION, TOE (Left) - L 4th toe amputation as a surgical intervention    DOS: 11/10/2024 Procedure: Left fourth digit amputation  73 y.o. female returns for post-op check.  She states she is doing okay denies any other acute complaints.  Review of Systems: Negative except as noted in the HPI. Denies N/V/F/Ch.  Past Medical History:  Diagnosis Date   Acute pyelonephritis    Bacteremia, escherichia coli 01/12/2015   Chest pain    Critical lower limb ischemia (HCC)    ESRD (end stage renal disease) (HCC)    Hyperlipidemia    Hypertension    Left carotid bruit    PAD (peripheral artery disease)    Stroke (HCC)    Type II diabetes mellitus (HCC)     Current Outpatient Medications:    acetaminophen  (TYLENOL ) 325 MG tablet, Take 1-2 tablets (325-650 mg total) by mouth every 4 (four) hours as needed for mild pain., Disp: , Rfl:    aspirin  EC 81 MG tablet, Take 1 tablet (81 mg total) by mouth daily. Swallow whole., Disp: 90 tablet, Rfl: 0   atorvastatin  (LIPITOR ) 40 MG tablet, Take 40 mg by mouth daily., Disp: , Rfl:    brimonidine  (ALPHAGAN ) 0.2 % ophthalmic solution, Place 1 drop into the right eye 2 (two) times daily., Disp: , Rfl:    carvedilol  (COREG ) 6.25 MG tablet, Take 1 tablet (6.25 mg total) by mouth 2 (two) times daily with a meal., Disp: 180 tablet, Rfl: 2   clopidogrel  (PLAVIX ) 75 MG tablet, TAKE 1 TABLET BY MOUTH EVERY DAY (Patient taking differently: Take 75 mg by mouth at bedtime.), Disp: 90 tablet, Rfl: 1   dorzolamide -timolol  (COSOPT ) 2-0.5 % ophthalmic solution, Place 1 drop into the right eye 2 (two) times daily., Disp: , Rfl:    insulin  glargine (LANTUS ) 100 UNIT/ML Solostar Pen, Inject 20 Units into the skin at bedtime., Disp: , Rfl:    latanoprost  (XALATAN ) 0.005 % ophthalmic solution, Place  1 drop into the right eye at bedtime., Disp: , Rfl:    lisinopril  (ZESTRIL ) 2.5 MG tablet, Take 2.5 mg by mouth daily., Disp: , Rfl:    Multiple Vitamin (MULTIVITAMIN) capsule, Take 1 capsule by mouth at bedtime., Disp: , Rfl:    ondansetron  (ZOFRAN ) 4 MG tablet, Take 1 tablet (4 mg total) by mouth every 6 (six) hours as needed for nausea., Disp: 15 tablet, Rfl: 0   oxyCODONE  (OXY IR/ROXICODONE ) 5 MG immediate release tablet, Take 1 tablet (5 mg total) by mouth every 6 (six) hours as needed for severe pain., Disp: 15 tablet, Rfl: 0   sevelamer  carbonate (RENVELA ) 800 MG tablet, TAKE 1 TABLET BY MOUTH 3 TIMES DAILY WITH MEALS., Disp: 270 tablet, Rfl: 1   sodium chloride  flush 0.9 % SOLN injection, Flush cholecystectomy tube as directed by general surgery, Disp: 10 mL, Rfl: 10  Social History   Tobacco Use  Smoking Status Never  Smokeless Tobacco Never    Allergies  Allergen Reactions   Chicken Allergy Other (See Comments)    headache   Perflutren  Other (See Comments)    Pt with back pain as soon as administered.- patient refuted this in 2024   Objective:  There were no vitals filed for this visit. There is no height or weight on file to calculate BMI. Constitutional Well  developed. Well nourished.  Vascular Foot warm and well perfused. Capillary refill normal to all digits.   Neurologic Normal speech. Oriented to person, place, and time. Epicritic sensation to light touch grossly present bilaterally.  Dermatologic Skin completely epithelialized.  No signs of dehiscence noted no complication noted.  Incision site has healed  Orthopedic: No further tenderness to palpation noted about the surgical site.   Radiographs: None Assessment:   No diagnosis found.  Plan:  Patient was evaluated and treated and all questions answered.  S/p foot surgery left - Clinically healed and officially discharged from my care if any foot and ankle issues on future she will come back and see me.   Skin has completely epithelialized she can return to regular shoes.  No follow-ups on file.

## 2024-12-11 ENCOUNTER — Encounter (INDEPENDENT_AMBULATORY_CARE_PROVIDER_SITE_OTHER): Payer: Self-pay | Admitting: Ophthalmology

## 2024-12-11 ENCOUNTER — Ambulatory Visit (INDEPENDENT_AMBULATORY_CARE_PROVIDER_SITE_OTHER): Payer: Medicare (Managed Care) | Admitting: Ophthalmology

## 2024-12-11 DIAGNOSIS — E113591 Type 2 diabetes mellitus with proliferative diabetic retinopathy without macular edema, right eye: Secondary | ICD-10-CM

## 2024-12-11 DIAGNOSIS — Z794 Long term (current) use of insulin: Secondary | ICD-10-CM

## 2024-12-11 DIAGNOSIS — H4051X3 Glaucoma secondary to other eye disorders, right eye, severe stage: Secondary | ICD-10-CM

## 2024-12-11 DIAGNOSIS — H211X1 Other vascular disorders of iris and ciliary body, right eye: Secondary | ICD-10-CM

## 2024-12-11 DIAGNOSIS — I10 Essential (primary) hypertension: Secondary | ICD-10-CM | POA: Diagnosis not present

## 2024-12-11 DIAGNOSIS — E113412 Type 2 diabetes mellitus with severe nonproliferative diabetic retinopathy with macular edema, left eye: Secondary | ICD-10-CM

## 2024-12-11 DIAGNOSIS — Z7985 Long-term (current) use of injectable non-insulin antidiabetic drugs: Secondary | ICD-10-CM | POA: Diagnosis not present

## 2024-12-11 DIAGNOSIS — Z961 Presence of intraocular lens: Secondary | ICD-10-CM

## 2024-12-11 DIAGNOSIS — H25811 Combined forms of age-related cataract, right eye: Secondary | ICD-10-CM

## 2024-12-11 DIAGNOSIS — H35033 Hypertensive retinopathy, bilateral: Secondary | ICD-10-CM | POA: Diagnosis not present

## 2024-12-11 MED ORDER — AFLIBERCEPT 2MG/0.05ML IZ SOLN FOR KALEIDOSCOPE
2.0000 mg | INTRAVITREAL | Status: AC | PRN
  Administered 2024-12-11: 23:00:00 2 mg via INTRAVITREAL

## 2024-12-29 ENCOUNTER — Ambulatory Visit (HOSPITAL_COMMUNITY): Payer: Medicare (Managed Care) | Attending: Internal Medicine

## 2024-12-29 ENCOUNTER — Telehealth: Payer: Self-pay

## 2024-12-29 ENCOUNTER — Ambulatory Visit (HOSPITAL_COMMUNITY): Payer: Medicare (Managed Care)

## 2024-12-29 ENCOUNTER — Ambulatory Visit: Payer: Medicare (Managed Care)

## 2024-12-29 NOTE — Telephone Encounter (Signed)
 [facility] brought pt at 2pm. Pt is Spanish speaking, so driver told [facility] that they would call to r/s pt. All of this was translated to pt via language services via Ipad.

## 2024-12-29 NOTE — Progress Notes (Deleted)
 " HISTORY AND PHYSICAL     CC:  follow up. Requesting Provider:  Cloria Annabella CROME, DO  HPI: This is a 74 y.o. female who is here today for follow up for PAD.  Pt has hx of angiogram with angioplasty and stent of left BK popliteal artery and TPT on 11/09/2024 by Dr. Gretta for CLI with tissue loss.  Prior vascular intervention includes orbital atherectomy of the left SFA and popliteal arteries in 2016 with Dr. Court.  She has hx of ESRD, DM  The pt returns today for follow up.  ***  She was seen by Dr. 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   The pt is on a statin for cholesterol management.    The pt is on an aspirin .    Other AC:  Plavix  The pt is on BB, ACEI for hypertension.  The pt is  on diabetic medication. Tobacco hx:  never  Pt does *** have family hx of AAA.  Past Medical History:  Diagnosis Date   Acute pyelonephritis    Bacteremia, escherichia coli 01/12/2015   Chest pain    Critical lower limb ischemia (HCC)    ESRD (end stage renal disease) (HCC)    Hyperlipidemia    Hypertension    Left carotid bruit    PAD (peripheral artery disease)    Stroke (HCC)    Type II diabetes mellitus (HCC)     Past Surgical History:  Procedure Laterality Date   ABDOMINAL ANGIOGRAM  05/16/2015   Procedure: Abdominal Angiogram;  Surgeon: Dorn JINNY Court, MD;  Location: [facility];  Service: Cardiovascular;;   ABDOMINAL HYSTERECTOMY  ~ 2000   AMPUTATION TOE Left 11/10/2024   Procedure: LEFT FOURTH TOE AMPUTATION;  Surgeon: Malvin Marsa FALCON, DPM;  Location: [facility] OR;  Service: Orthopedics/Podiatry;  Laterality: Left;   CHOLECYSTECTOMY N/A 09/23/2023   Procedure: LAPAROSCOPIC CHOLECYSTECTOMY;  Surgeon: Kinsinger, Herlene Righter, MD;  Location: [facility] OR;  Service: General;  Laterality: N/A;   IR EXCHANGE BILIARY DRAIN  07/26/2023   IR FLUORO GUIDE CV LINE RIGHT  05/17/2023   IR PERC CHOLECYSTOSTOMY  05/23/2023   IR US  GUIDE VASC ACCESS RIGHT   05/17/2023   LOWER EXTREMITY ANGIOGRAPHY N/A 11/09/2024   Procedure: Lower Extremity Angiography;  Surgeon: Gretta Lonni JINNY, MD;  Location: [facility];  Service: Cardiovascular;  Laterality: N/A;   LOWER EXTREMITY INTERVENTION Left 11/09/2024   Procedure: LOWER EXTREMITY INTERVENTION;  Surgeon: Gretta Lonni JINNY, MD;  Location: [facility];  Service: Cardiovascular;  Laterality: Left;  Pop, TP Trunk, Peroneal   PERIPHERAL VASCULAR CATHETERIZATION Bilateral 05/16/2015   Procedure: Lower Extremity Angiography;  Surgeon: Dorn JINNY Court, MD; renal arteries widely patent, L-SFA 75%, L-pop 95%, L-peroneal 90%; R-SFA 40%, R-pop 50%, R-prox peroneal 90%; directional atherectomy L-SFA, p-pop, drug-eluting balloon angioplasty reducing the stenoses to 0, small linear dissection was not flow limiting       PERIPHERAL VASCULAR CATHETERIZATION Left 05/16/2015   Procedure: Peripheral Vascular Atherectomy;  Surgeon: Dorn JINNY Court, MD;  Location: [facility];  Service: Cardiovascular;  Laterality: Left;  sfa    Allergies[1]  Current Outpatient Medications  Medication Sig Dispense Refill   acetaminophen  (TYLENOL ) 325 MG tablet Take 1-2 tablets (325-650 mg total) by mouth every 4 (four) hours as needed for mild pain.     aspirin  EC 81 MG tablet Take 1 tablet (81 mg total) by mouth daily. Swallow whole. 90 tablet 0  atorvastatin  (LIPITOR ) 40 MG tablet Take 40 mg by mouth daily.     brimonidine  (ALPHAGAN ) 0.2 % ophthalmic solution Place 1 drop into the right eye 2 (two) times daily.     carvedilol  (COREG ) 6.25 MG tablet Take 1 tablet (6.25 mg total) by mouth 2 (two) times daily with a meal. 180 tablet 2   clopidogrel  (PLAVIX ) 75 MG tablet TAKE 1 TABLET BY MOUTH EVERY DAY 90 tablet 1   dorzolamide -timolol  (COSOPT ) 2-0.5 % ophthalmic solution Place 1 drop into the right eye 2 (two) times daily.     insulin  glargine (LANTUS ) 100 UNIT/ML Solostar Pen Inject 20 Units into the skin at  bedtime.     latanoprost  (XALATAN ) 0.005 % ophthalmic solution Place 1 drop into the right eye at bedtime.     lisinopril  (ZESTRIL ) 2.5 MG tablet Take 2.5 mg by mouth daily.     Multiple Vitamin (MULTIVITAMIN) capsule Take 1 capsule by mouth at bedtime.     ondansetron  (ZOFRAN ) 4 MG tablet Take 1 tablet (4 mg total) by mouth every 6 (six) hours as needed for nausea. 15 tablet 0   oxyCODONE  (OXY IR/ROXICODONE ) 5 MG immediate release tablet Take 1 tablet (5 mg total) by mouth every 6 (six) hours as needed for severe pain. 15 tablet 0   sevelamer  carbonate (RENVELA ) 800 MG tablet TAKE 1 TABLET BY MOUTH 3 TIMES DAILY WITH MEALS. 270 tablet 1   sodium chloride  flush 0.9 % SOLN injection Flush cholecystectomy tube as directed by general surgery 10 mL 10   No current facility-administered medications for this visit.    Family History  Problem Relation Age of Onset   Hypertension Mother     Social History   Socioeconomic History   Marital status: Widowed    Spouse name: Not on file   Number of children: Not on file   Years of education: Not on file   Highest education level: Not on file  Occupational History   Not on file  Tobacco Use   Smoking status: Never   Smokeless tobacco: Never  Vaping Use   Vaping status: Never Used  Substance and Sexual Activity   Alcohol  use: Not Currently    Alcohol /week: 1.0 - 2.0 standard drink of alcohol     Types: 1 - 2 Cans of beer per week    Comment: occas   Drug use: No   Sexual activity: Never  Other Topics Concern   Not on file  Social History Narrative   Not on file   Social Drivers of Health   Tobacco Use: Low Risk (12/11/2024)   Patient History    Smoking Tobacco Use: Never    Smokeless Tobacco Use: Never    Passive Exposure: Not on file  Financial Resource Strain: Not on file  Food Insecurity: Food Insecurity Present (11/06/2024)   Epic    Worried About Programme Researcher, Broadcasting/film/video in the Last Year: Sometimes true    Barista in  the Last Year: Sometimes true  Transportation Needs: No Transportation Needs (11/06/2024)   Epic    Lack of Transportation (Medical): No    Lack of Transportation (Non-Medical): No  Physical Activity: Not on file  Stress: Not on file  Social Connections: Moderately Isolated (11/06/2024)   Social Connection and Isolation Panel    Frequency of Communication with Friends and Family: More than three times a week    Frequency of Social Gatherings with Friends and Family: Once a week    Attends  Religious Services: 1 to 4 times per year    Active Member of Clubs or Organizations: No    Attends Banker Meetings: Never    Marital Status: Widowed  Intimate Partner Violence: Not At Risk (11/06/2024)   Epic    Fear of Current or Ex-Partner: No    Emotionally Abused: No    Physically Abused: No    Sexually Abused: No  Depression (PHQ2-9): Low Risk (06/30/2023)   Depression (PHQ2-9)    PHQ-2 Score: 0  Alcohol  Screen: Not on file  Housing: Low Risk (11/06/2024)   Epic    Unable to Pay for Housing in the Last Year: No    Number of Times Moved in the Last Year: 0    Homeless in the Last Year: No  Utilities: Not At Risk (11/06/2024)   Epic    Threatened with loss of utilities: No  Health Literacy: Not on file     REVIEW OF SYSTEMS:  *** [X]  denotes positive finding, [ ]  denotes negative finding Cardiac  Comments:  Chest pain or chest pressure:    Shortness of breath upon exertion:    Short of breath when lying flat:    Irregular heart rhythm:        Vascular    Pain in calf, thigh, or hip brought on by ambulation:    Pain in feet at night that wakes you up from your sleep:     Blood clot in your veins:    Leg swelling:         Pulmonary    Oxygen at home:    Productive cough:     Wheezing:         Neurologic    Sudden weakness in arms or legs:     Sudden numbness in arms or legs:     Sudden onset of difficulty speaking or slurred speech:    Temporary loss of  vision in one eye:     Problems with dizziness:         Gastrointestinal    Blood in stool:     Vomited blood:         Genitourinary    Burning when urinating:     Blood in urine:        Psychiatric    Major depression:         Hematologic    Bleeding problems:    Problems with blood clotting too easily:        Skin    Rashes or ulcers:        Constitutional    Fever or chills:      PHYSICAL EXAMINATION:  ***  General:  WDWN in NAD; vital signs documented above Gait: Not observed HENT: WNL, normocephalic Pulmonary: normal non-labored breathing , without wheezing Cardiac: {Desc; regular/irreg:14544} HR, {With/Without:20273} carotid bruit*** Abdomen: soft, NT; aortic pulse is *** palpable Skin: {With/Without:20273} rashes Vascular Exam/Pulses:  Right Left  Radial {Exam; arterial pulse strength 0-4:30167} {Exam; arterial pulse strength 0-4:30167}  Femoral {Exam; arterial pulse strength 0-4:30167} {Exam; arterial pulse strength 0-4:30167}  Popliteal {Exam; arterial pulse strength 0-4:30167} {Exam; arterial pulse strength 0-4:30167}  DP {Exam; arterial pulse strength 0-4:30167} {Exam; arterial pulse strength 0-4:30167}  PT {Exam; arterial pulse strength 0-4:30167} {Exam; arterial pulse strength 0-4:30167}  Peroneal *** ***   Extremities: {With/Without:20273} ischemic changes, {With/Without:20273} Gangrene , {With/Without:20273} cellulitis; {With/Without:20273} open wounds Musculoskeletal: no muscle wasting or atrophy  Neurologic: A&O X 3 Psychiatric:  The pt has {Desc; normal/abnormal:11317::Normal}  affect.   Non-Invasive Vascular Imaging:   ABI's/TBI's on 12/29/2024: Right:  *** - Great toe pressure: *** Left:  *** - Great toe pressure: ***  Arterial duplex on 12/29/2024: ***  Previous ABI's/TBI's on 11/07/2024: Right:  1.62/0.65 - Great toe pressure: 102 Left:  0.97/0.64 - Great toe pressure:  101  Previous carotid duplex 08/25/2024 Bilateral 1-39% ICA  stenosis    ASSESSMENT/PLAN:: 73 y.o. female here for follow up for PAD with hx of angiogram with angioplasty and stent of left BK popliteal artery and TPT on 11/09/2024 by Dr. Gretta for CLI with tissue loss.  Prior vascular intervention includes orbital atherectomy of the left SFA and popliteal arteries in 2016 with Dr. Court.   -*** -continue *** -discussed importance of increased walking daily -pt will f/u in *** with ***.   Lucie Apt, [facility] [phone]  Clinic MD:   Sheree on call MD     [1]  Allergies Allergen Reactions   Chicken Allergy Other (See Comments)    headache   Perflutren  Other (See Comments)    Pt with back pain as soon as administered.- patient refuted this in 2024   "

## 2025-02-19 ENCOUNTER — Encounter (INDEPENDENT_AMBULATORY_CARE_PROVIDER_SITE_OTHER): Payer: Medicare (Managed Care) | Admitting: Ophthalmology

## 2025-03-06 ENCOUNTER — Ambulatory Visit: Payer: Medicare (Managed Care)

## 2025-03-06 ENCOUNTER — Ambulatory Visit (HOSPITAL_COMMUNITY): Payer: Medicare (Managed Care)
